# Patient Record
Sex: Female | Born: 1935 | Race: White | Hispanic: No | Smoking: Never smoker
Health system: Southern US, Community
[De-identification: ages and names within clinical notes are randomized; demographics above are authoritative.]

## PROBLEM LIST (undated history)

## (undated) DIAGNOSIS — G5603 Carpal tunnel syndrome, bilateral upper limbs: Secondary | ICD-10-CM

## (undated) DIAGNOSIS — H269 Unspecified cataract: Secondary | ICD-10-CM

## (undated) DIAGNOSIS — I471 Supraventricular tachycardia, unspecified: Secondary | ICD-10-CM

## (undated) DIAGNOSIS — K219 Gastro-esophageal reflux disease without esophagitis: Secondary | ICD-10-CM

## (undated) DIAGNOSIS — M1990 Unspecified osteoarthritis, unspecified site: Secondary | ICD-10-CM

## (undated) DIAGNOSIS — K209 Esophagitis, unspecified without bleeding: Secondary | ICD-10-CM

## (undated) DIAGNOSIS — Z8719 Personal history of other diseases of the digestive system: Secondary | ICD-10-CM

## (undated) DIAGNOSIS — M8580 Other specified disorders of bone density and structure, unspecified site: Secondary | ICD-10-CM

## (undated) DIAGNOSIS — B029 Zoster without complications: Secondary | ICD-10-CM

## (undated) DIAGNOSIS — D649 Anemia, unspecified: Secondary | ICD-10-CM

## (undated) DIAGNOSIS — C801 Malignant (primary) neoplasm, unspecified: Secondary | ICD-10-CM

## (undated) DIAGNOSIS — M419 Scoliosis, unspecified: Secondary | ICD-10-CM

## (undated) HISTORY — PX: BUNIONECTOMY: SHX129

## (undated) HISTORY — DX: Esophagitis, unspecified without bleeding: K20.90

## (undated) HISTORY — PX: COLONOSCOPY: SHX174

## (undated) HISTORY — DX: Other specified disorders of bone density and structure, unspecified site: M85.80

## (undated) HISTORY — DX: Scoliosis, unspecified: M41.9

## (undated) HISTORY — DX: Carpal tunnel syndrome, bilateral upper limbs: G56.03

## (undated) HISTORY — DX: Zoster without complications: B02.9

## (undated) HISTORY — DX: Unspecified cataract: H26.9

## (undated) HISTORY — DX: Gastro-esophageal reflux disease without esophagitis: K21.9

## (undated) HISTORY — PX: UPPER GASTROINTESTINAL ENDOSCOPY: SHX188

## (undated) HISTORY — PX: OTHER SURGICAL HISTORY: SHX169

## (undated) HISTORY — DX: Esophagitis, unspecified: K20.9

## (undated) HISTORY — DX: Unspecified osteoarthritis, unspecified site: M19.90

## (undated) HISTORY — PX: ABDOMINAL HYSTERECTOMY: SHX81

---

## 1959-05-03 HISTORY — PX: HERNIA REPAIR: SHX51

## 1998-10-20 ENCOUNTER — Other Ambulatory Visit: Admission: RE | Admit: 1998-10-20 | Discharge: 1998-10-20 | Payer: Self-pay | Admitting: Obstetrics and Gynecology

## 1999-11-09 ENCOUNTER — Other Ambulatory Visit: Admission: RE | Admit: 1999-11-09 | Discharge: 1999-11-09 | Payer: Self-pay | Admitting: Obstetrics and Gynecology

## 1999-11-10 ENCOUNTER — Other Ambulatory Visit: Admission: RE | Admit: 1999-11-10 | Discharge: 1999-11-10 | Payer: Self-pay | Admitting: Obstetrics and Gynecology

## 1999-11-10 ENCOUNTER — Encounter (INDEPENDENT_AMBULATORY_CARE_PROVIDER_SITE_OTHER): Payer: Self-pay

## 2000-01-13 ENCOUNTER — Other Ambulatory Visit: Admission: RE | Admit: 2000-01-13 | Discharge: 2000-01-13 | Payer: Self-pay | Admitting: Obstetrics and Gynecology

## 2000-01-13 ENCOUNTER — Encounter (INDEPENDENT_AMBULATORY_CARE_PROVIDER_SITE_OTHER): Payer: Self-pay | Admitting: Specialist

## 2000-03-03 ENCOUNTER — Other Ambulatory Visit: Admission: RE | Admit: 2000-03-03 | Discharge: 2000-03-03 | Payer: Self-pay | Admitting: Obstetrics and Gynecology

## 2000-03-03 ENCOUNTER — Encounter (INDEPENDENT_AMBULATORY_CARE_PROVIDER_SITE_OTHER): Payer: Self-pay | Admitting: Specialist

## 2000-03-17 ENCOUNTER — Inpatient Hospital Stay (HOSPITAL_COMMUNITY): Admission: RE | Admit: 2000-03-17 | Discharge: 2000-03-20 | Payer: Self-pay | Admitting: Obstetrics and Gynecology

## 2000-03-17 ENCOUNTER — Encounter (INDEPENDENT_AMBULATORY_CARE_PROVIDER_SITE_OTHER): Payer: Self-pay | Admitting: Specialist

## 2000-11-09 ENCOUNTER — Other Ambulatory Visit: Admission: RE | Admit: 2000-11-09 | Discharge: 2000-11-09 | Payer: Self-pay | Admitting: Obstetrics and Gynecology

## 2001-11-20 ENCOUNTER — Other Ambulatory Visit: Admission: RE | Admit: 2001-11-20 | Discharge: 2001-11-20 | Payer: Self-pay | Admitting: Obstetrics and Gynecology

## 2002-12-25 ENCOUNTER — Other Ambulatory Visit: Admission: RE | Admit: 2002-12-25 | Discharge: 2002-12-25 | Payer: Self-pay | Admitting: Obstetrics and Gynecology

## 2004-01-27 ENCOUNTER — Other Ambulatory Visit: Admission: RE | Admit: 2004-01-27 | Discharge: 2004-01-27 | Payer: Self-pay | Admitting: Obstetrics and Gynecology

## 2004-03-08 ENCOUNTER — Ambulatory Visit: Payer: Self-pay | Admitting: Internal Medicine

## 2004-07-06 ENCOUNTER — Ambulatory Visit: Payer: Self-pay | Admitting: Internal Medicine

## 2005-02-02 ENCOUNTER — Other Ambulatory Visit: Admission: RE | Admit: 2005-02-02 | Discharge: 2005-02-02 | Payer: Self-pay | Admitting: Obstetrics and Gynecology

## 2005-08-16 ENCOUNTER — Ambulatory Visit: Payer: Self-pay | Admitting: Internal Medicine

## 2006-02-21 ENCOUNTER — Other Ambulatory Visit: Admission: RE | Admit: 2006-02-21 | Discharge: 2006-02-21 | Payer: Self-pay | Admitting: Obstetrics and Gynecology

## 2006-03-02 ENCOUNTER — Ambulatory Visit: Payer: Self-pay | Admitting: Internal Medicine

## 2010-04-12 ENCOUNTER — Ambulatory Visit: Payer: Self-pay | Admitting: Internal Medicine

## 2010-05-13 ENCOUNTER — Ambulatory Visit: Admit: 2010-05-13 | Payer: Self-pay | Admitting: Internal Medicine

## 2010-05-13 ENCOUNTER — Ambulatory Visit (HOSPITAL_COMMUNITY)
Admission: RE | Admit: 2010-05-13 | Discharge: 2010-05-13 | Payer: Self-pay | Source: Home / Self Care | Attending: Internal Medicine | Admitting: Internal Medicine

## 2010-05-17 LAB — BASIC METABOLIC PANEL
BUN: 4 mg/dL — ABNORMAL LOW (ref 6–23)
CO2: 30 mEq/L (ref 19–32)
Calcium: 8.9 mg/dL (ref 8.4–10.5)
Chloride: 99 mEq/L (ref 96–112)
Creatinine, Ser: 0.69 mg/dL (ref 0.4–1.2)
GFR calc Af Amer: >60 mL/min (ref >60)
GFR calc non Af Amer: >60 mL/min (ref >60)
Glucose, Bld: 93 mg/dL (ref 70–99)
Potassium: 4 mEq/L (ref 3.5–5.1)
Sodium: 140 mEq/L (ref 135–145)

## 2010-05-17 LAB — FECAL LACTOFERRIN, QUANT: Fecal Lactoferrin: POSITIVE

## 2010-05-17 LAB — CBC
HCT: 38.5 % (ref 36.0–46.0)
Hemoglobin: 12.8 g/dL (ref 12.0–15.0)
MCH: 28.8 pg (ref 26.0–34.0)
MCHC: 33.2 g/dL (ref 30.0–36.0)
MCV: 86.7 fL (ref 78.0–100.0)
Platelets: 280 10*3/uL (ref 150–400)
RBC: 4.44 MIL/uL (ref 3.87–5.11)
RDW: 13.6 % (ref 11.5–15.5)
WBC: 7 10*3/uL (ref 4.0–10.5)

## 2010-05-17 LAB — STOOL CULTURE

## 2010-05-17 LAB — OVA AND PARASITE EXAMINATION: Ova and parasites: NONE SEEN

## 2010-05-17 LAB — CLOSTRIDIUM DIFFICILE BY PCR: Toxigenic C. Difficile by PCR: NEGATIVE

## 2010-05-31 NOTE — Op Note (Signed)
[name], Soto               ACCOUNT NO.:  [id number]  MEDICAL RECORD NO.:  [id number]          PATIENT TYPE:  AMB  LOCATION:  DAY                           FACILITY:  [facility]  PHYSICIAN:  Hildred Laser, M.D.    DATE OF BIRTH:  [birth year]  DATE OF PROCEDURE:  05/13/2010 DATE OF DISCHARGE:  05/13/2010                              OPERATIVE REPORT   PROCEDURE:  Colonoscopy which was incomplete to hepatic flexure.  Lori Soto is a 75 year old Caucasian female with history of ulcerative colitis which was diagnosed in 2000, whose last colonoscopy was done over 5 years ago who presents to the office about a month ago with bloody diarrhea.  She has been under a lot of stress.  She has been on Asacol for years and has done well.  It was felt that she was having a relapse.  She was treated with hydrocortisone enemas, but without any improvement in her symptoms.  She is undergoing colonoscopy both for diagnostic and surveillance purposes.  Procedure risks were reviewed with the patient and informed consent was obtained.  MEDICATIONS FOR CONSCIOUS SEDATION: 1. Demerol 50 mg IV. 2. Versed 6 mg IV.  FINDINGS:  Procedure performed in endoscopy suite.  The patient's vital signs and O2 sats were monitored during the procedure and remained stable.  The patient was placed in left lateral recumbent position and rectal examination performed.  No abnormality noted on external or digital exam.  Pentax videoscope was placed through rectum, where there was extensive ulceration to the mucosa and mucosa that was not ulcerated was erythematous, edematous and friable with loss of vascularity.  Scope was passed into the sigmoid colon with similar changes.  There was about 10-12 mm polyp with ulceration at the sigmoid colon.  Scope was advanced into proximal sigmoid colon.  The segment was very tortuous and noncompliant.  With some difficulty, scope was passed further. Transition zone was at 35 cm from the  anal margin.  There was another large patch of erythematous mucosa with erosions at descending colon. Rest of the mucosa was normal to hepatic flexure.  Scope was passed into hepatic flexure.  I could see a short segment of ascending colon, but never could advance the scope further as she suspected to have a short loop which could not be palpated.  Changing the patient's position and withdrawing the scope back did not make any difference.  Therefore, examination without complete.  There were few scattered diverticula throughout the colon.  As the scope was withdrawn, colonic mucosa was examined for the second time.  No other abnormalities noted.  Multiple biopsies were taken from mucosa of the sigmoid colon and this polyp was also biopsied for histology.  This polyp was not snared because of changes of extensive colitis and presuming this is an adenoma this polyp would be removed later date.  Please note, stool sample was also obtained and sent to the lab for cultures by PCR.  Endoscope was withdrawn.  I did not attempt to retroflex the scope in the rectum.  The patient tolerated the procedure well.  FINAL DIAGNOSES: 1. Incomplete exam limited to hepatic  flexure.  Few small diverticula     scattered at transverse, descending and sigmoid colon. 2. Extensive changes of active colitis involving the sigmoid colon and     rectum with a patch of focal colitis at descending colon.  Multiple     biopsies taken. 3. A 10-12 mm polyp at the sigmoid colon which was biopsied for     histology and not removed because of underlying active colitis.  RECOMMENDATIONS: 1. She will continue Asacol at 1.6 g p.o. b.i.d. 2. Rowasa enemas 4 g one p.o. nightly for 4 weeks.  I will be contacting the patient with results of biopsy and stool studies and further recommendations.     Hildred Laser, M.D.     NR/MEDQ  D:  05/13/2010  T:  05/14/2010  Job:  [id number]  cc:   Chipper Herb, M.D. Fax:  [phone]  Electronically Signed by Hildred Laser M.D. on 05/31/2010 07:18:10 AM

## 2010-07-12 ENCOUNTER — Ambulatory Visit (INDEPENDENT_AMBULATORY_CARE_PROVIDER_SITE_OTHER): Payer: Self-pay | Admitting: Internal Medicine

## 2010-07-26 ENCOUNTER — Ambulatory Visit (INDEPENDENT_AMBULATORY_CARE_PROVIDER_SITE_OTHER): Payer: Medicare Other | Admitting: Internal Medicine

## 2010-07-26 DIAGNOSIS — K5190 Ulcerative colitis, unspecified, without complications: Secondary | ICD-10-CM

## 2010-08-08 NOTE — Consult Note (Signed)
NAMEOCTA, UPLINGER               ACCOUNT NO.:  [id number]  MEDICAL RECORD NO.:  [id number]           PATIENT TYPE:AMB  LOCATION: [place]                    FACILITY:  PHYSICIAN:  Hildred Laser, M.D.    DATE OF BIRTH:  Jan 24, 1936   DATE : 07/27/2010.                                OFFICE VISIT.   PRESENTING COMPLAINT:  Followup for ulcerative colitis.  SUBJECTIVE:  Lori Soto is a 75 year old Caucasian female who underwent colonoscopy on May 13, 2010, because of consistent bloody diarrhea. It was incomplete exam to hepatic flexure.  She had few diverticula at sigmoid descending and transverse colon.  She had extensive changes of colitis, involving rectum, sigmoid colon, and focal colitis at the descending colon.  She also had polyp at sigmoid colon which was biopsied and was inflammatory or pseudopolyp.  Biopsy from other areas showed active disease.  She was begun on prednisone on May 27, 2010, given a tapering schedule.  Her stool study had been negative.  She states she is feeling much better.  She has come off her prednisone, last dose 4 days ago.  She is not taking hyoscyamine.  She is taking her Asacol as prescribed.  She is having 2-3 bowel movements per day.  Her stools are soft.  She has not seen any blood for the last 1 month.  She remains under lot of stress because of her husband illness.  He has history of lung carcinoma and lately has been having chest and right shoulder pain.  He has an appointment to be seen later this week by Dr. Laurance Flatten.  Her weight is down by 4 pounds since the last visit about 10 weeks ago.  Overall, she feels much better.  She uses OTC Motrin occasionally.  CURRENT MEDICATIONS: 1. Estradiol 1 mg p.o. every other day. 2. Zinc 50 mg p.o. daily. 3. Vitamin B6 100 mg daily. [place] 1 daily. 5. Magnesium 250 mg p.o. daily. 6. KCl 99 mg p.o. daily. 7. Asacol 1.6 g b.i.d. 8. Glucosamine sulfate with MSM 1 daily. 9. Calcium 500  mg. 10.Vitamin D daily. 11.Vitamin D 3000 units daily. 12.Cranberry fruits 4.2 g p.o. daily. 13.Vitamin B12 500 mg daily. 14.Drink of [place] 120 mg daily. 15.MVI daily. 16.ASA 81 mg daily. 17.Pentasa, Rowasa enemas every other day.  OBJECTIVE:  VITAL SIGNS:  148 pounds.  She is 63 and 1/2 inches tall, pulse 76 per minute, blood pressure 128/94, temperature is 97.1. HEENT:  Conjunctivae pink.  Sclerae nonicteric.  Oropharyngeal mucosa is normal. NECK:  No neck masses or thyromegaly noted. ABDOMEN:  Symmetrical.  Bowel sounds are normal on palpation.  Soft abdomen with very mild tenderness at LLQ.  No organomegaly or masses. No peripheral edema or clubbing.  ASSESSMENT:  Chronic ulcerative colitis with a recent flare up, probably stress induced.  She is doing better with therapy.  Not mentioned above, she has experienced intermittent left lower quadrant abdominal pain which appears to be posterior and probably referred from her back.  PLAN:  She will continue Asacol at 1.6 g p.o. daily.  RECOMMENDATIONS:  She will finish up on her Rowasa enemas, she will use 1 every third  day.  Unless she has problem, she will return for OV in 6 months.     Hildred Laser, M.D.     NR/MEDQ  D:  07/27/2010  T:  07/27/2010  Job:  [id number]  cc:   Chipper Herb, M.D. Fax: [phone]  Electronically Signed by Hildred Laser M.D. on 08/08/2010 01:02:51 PM

## 2010-09-17 NOTE — H&P (Signed)
[facility]  Patient:    Lori Soto, Lori Soto                        MRN: [id number] Adm. Date:  03/17/00 Attending:  Jeneen Rinks A. Zigmund Daniel, M.D.                         History and Physical  HISTORY OF PRESENT ILLNESS:  This is a 75 year old gravida 3, para 3 who presented January 12, 2000 complaining of intermittent left lower quadrant discomfort of four months duration.  She has a known fibroid uterus.  She also has a history of focal simple hyperplasia as of a November 09, 1999 biopsy. She has been maintained on Megace 40 mg daily since then.  Repeat endometrial biopsy was performed on or about January 12, 2000.  It revealed residual simple hyperplasia without atypia; this was despite six weeks of Megace therapy.  Alternatives were discussed and the patient elected to proceed with hysterectomy and bilateral salpingo-oophorectomy.  Due to her history of pelvic discomfort and the fibroid uterus, it was felt best to proceed with laparoscopically assisted vaginal hysterectomy.  MEDICATIONS 1. Premarin 0.625 mg daily. 2. Provera 5 mg, days 1 through 12 each month, prior to Megace, which has been    40 mg daily.  CHRONIC MEDICAL ILLNESS:  Scoliosis.  SURGERY:  Herniorrhaphy in 1961, removal of breast lump in 1984, foot surgery in 1992.  ALLERGIES:  PENICILLIN -- rash.  FAMILY HISTORY:  Noncontributory.  SOCIAL HISTORY:  Patient denies the use of tobacco or significant alcohol.  REVIEW OF SYSTEMS:  Noncontributory.  PHYSICAL EXAMINATION  GENERAL:  Well-developed, well-nourished, pleasant white female in no acute distress.  Afebrile.  VITAL SIGNS:  Stable.  SKIN:  Warm and dry without lesions.  LYMPH:  There is no supraclavicular, cervical or inguinal adenopathy.  HEENT:  Normocephalic.  NECK:  Supple without thyromegaly.  CHEST:  Lungs clear.  CARDIAC:  Regular rate and rhythm without murmurs, gallops or rubs.  BREASTS:  Deferred.  ABDOMEN:   Soft and nontender, without masses or organomegaly.  Bowel sounds are active.  MUSCULOSKELETAL:  Full range of motion, without edema, cyanosis or CVA tenderness.  PELVIC:  Deferred until examination under anesthesia.  IMPRESSION 1. Fibroid uterus. 2. Endometrial hyperplasia without atypia -- refractory to Megace therapy. 3. Left lower quadrant discomfort -- etiology uncertain.  PLAN:  Laparoscopically assisted vaginal hysterectomy and bilateral salpingo-oophorectomy.  Risks, benefits, complications and alternatives were fully discussed with the patient.  She states she understands and accepts. Questions invited and answered. DD:  03/17/00 TD:  03/17/00 Job: [id number]

## 2010-09-17 NOTE — Discharge Summary (Signed)
[facility]  Patient:    Lori Soto, Lori Soto                     MRN: [id number] Adm. Date:  [id number] Disc. Date: [id number] Attending:  Kendal Hymen                           Discharge Summary  DISCHARGE DIAGNOSES: 1. Endometrial hyperplasia - refractory to medical management. 2. Pelvic pain. 3. Adenomyosis. 4. Multiple leiomyomata. 5. Endometriosis.  OPERATIONS AND SPECIAL PROCEDURES: 1. Diagnostic laparoscopy. 2. Total abdominal hysterectomy/bilateral salpingo-oophorectomy.  CONSULTATIONS:  None.  DISCHARGE MEDICATIONS: 1. Estrace 1 mg daily. 2. Darvocet-N 100 one p.o. q.3-4h. p.r.n. pain.  HISTORY OF PRESENT ILLNESS:  This is a 75 year old gravida 3, para 2, AB 1, followed since September 1973 with left lower quadrant discomfort, known fibroid uterus, and also endometrial hyperplasia.  She is admitted for laparoscopically assisted vaginal hysterectomy and bilateral salpingo-oophorectomy for the above diagnoses including hyperplasia refractory to medical management.  HOSPITAL COURSE:  Patient was admitted to [facility].  Admission laboratory studies were drawn.  On March 17, 2000, she was taken to the operating room and underwent diagnostic laparoscopy.  In the context of this procedure, it was apparent that patient could not undergo laparoscopically assisted vaginal hysterectomy.  Hence, the decision was made to proceed with total abdominal hysterectomy and bilateral salpingo-oophorectomy.  Please see operative report for details.  There were no intraoperative complications. Postoperative course was benign.  Patient was discharged on March 19, 2000, afebrile and in satisfactory condition.  ACCESSORY CLINICAL FINDINGS:  Hemoglobin and hematocrit on admission was 11.9 and 34.5 respectively.  Repeat labs obtained on March 20, 2000, were 9.5 and 27.6 respectively.  Type and Rh revealed AB negative blood.   Antibody screen was positive for ______  DISPOSITION:  Patient is to return to [facility] in approximately four weeks for postoperative evaluation. DD:  05/25/00 TD:  05/25/00 Job: [id number]

## 2010-09-17 NOTE — Op Note (Signed)
[place]  Patient:    ARTA, STUMP                     MRN: [id number] Proc. Date: 03/17/00 Adm. Date:  [id number] Attending:  Kendal Hymen                           Operative Report  PREOPERATIVE DIAGNOSES: 1. Endometrial hyperplasia-refractory to medical management. 2. Pelvic pain.  POSTOPERATIVE DIAGNOSES: 1. Endometrial hyperplasia-refractory to medical management. 2. Pelvic pain. 3. Pathology pending. 4. Adhesions from the rectum to the posterior aspect of the uterus and left    ovary to the broad ligament.  PROCEDURE: 1. Diagnostic laparoscopy. 2. Total abdominal hysterectomy/bilateral salpingo-oophorectomy.  SURGEON:  Gerda Diss. Zigmund Daniel, M.D.  ASSISTANT:  Dallie Dad, M.D.  ANESTHESIA:  General.  ESTIMATED BLOOD LOSS:  500 cc.  COMPLICATIONS:  None.  PACKS AND DRAINS:  Foley.  Sponge, needle, and instrument count reported as correct x 2.  PROCEDURE:  Patient was taken to the operating room and placed in the dorsal supine position.  After adequate general anesthesia was administered she was placed in the lithotomy position and prepped and draped in the usual manner for abdominal and vaginal surgery.  Posterior weighted retractor was placed per vagina.  Hulka tenaculum was placed into the cervix for uterine manipulation.  Foley catheter was placed.  Next, a 1.5 cm infraumbilical incision was made in the longitudinal plane.  The size 10/11 disposable laparoscopic Trocar was placed into the abdominal cavity.  The laparoscope was placed.  There was no evidence of any trauma.  Pneumoperitoneum was created and maintained throughout with CO2.  The pelvis was thoroughly inspected.  The uterus was normal size, shape, and contour without any evidence of endometriosis or fibroids.  However, the colon/rectum was adherent to the posterior aspect of the cervix and took up the cul-de-sac making vaginal approach to  hysterectomy seemingly quite treacherous without significant laparoscopic lysis of adhesions prior to proceeding with that approach.  In addition, the left ovary was not easily visualized as it seemed to be adherent to the posterior aspect of the broad ligament and thus could not be satisfactorily mobilized.  The right ovary and fallopian tube were seen and appeared normal.  Remainder of the peritoneal surfaces were smooth and glistening.  At this point the decision was made to abandon the laparoscopic approach and the laparoscopic assisted vaginal hysterectomy for the afore mentioned reasons.  The laparoscopic assessment was aided with placement of two suprapubic Trocars in the left and right lower quadrants respectively. The abdominal instruments were removed, the pneumoperitoneum evacuated.  The fascial defect in the umbilicus was closed with 0 Vicryl.  The skin was closed with 3-0 Chromic in a subcuticular fashion.                                Attention was then turned to the laparotomy. Approximately three to four fingerbreadths above the symphysis the incision was made in a Pfannenstiel fashion including the skin incisions associated with the inferior Trocar ports from the laparoscopy.  The subcutaneous tissues were sharply dissected down to the fascia which was nicked with a knife and incised transversely with Mayo scissors.  The underlying rectus muscles were separated from the overlying fascia using sharp and blunt dissection.  The rectus muscles were separated in the midline  exposing the peritoneum which was elevated with Hemostats and entered atraumatically with Metzenbaum scissors. The incision was extended longitudinally.  Several adhesions were lysed of the colon to the left aspect of the pelvis and left adnexal area.  Balfour retractor was successfully placed.  The upper abdomen was packed off so that access to the pelvis could be allowed.  The rectum was sharply and  bluntly dissected away from the inferior aspect of the cervix thus that the hysterectomy could be successfully accomplished in this manner.  In addition, the adhesions of the left ovary to the left broad ligament were sharply and bluntly lysed in order to mobilize the left ovary.  The round ligaments were then bilaterally clamped and cut and secured with #1 Chromic catgut.  A bladder flap was developed by incising the anterior aspect of the broad ligament, sharply and bluntly dissecting the bladder inferiorly.  The visceral peritoneum was incised in order to isolate the infundibulopelvic ligaments. The ureters were bilaterally noted to be well below the plane of dissection. The infundibulopelvic ligaments were bilaterally clamped, cut, and doubly secured with #1 Chromic catgut.  Next, the uterine vessels were skeletonized bilaterally.  They were bilaterally clamped, cut, and doubly secured with #1 Chromic catgut.  Next, the bladder was continually advanced inferiorly using sharp and blunt dissection.  Straight Heaney clamps were used to come down on either side of the cervix hugging the cervix and paying careful attention to avoid injury to the urinary tract.  Each pedicle was clamped, cut, and secured with #1 Chromic catgut.  The uterosacral ligaments were isolated bilaterally and clamped, cut, and secured with #1 Chromic catgut.  The pedicles were held. The vagina was entered with the step and the cervix was excised with Satinsky scissors.  It should be mentioned that prior to excision of the cervix the corpus was removed in order to gain better visualization of the cervix. Entire specimen was submitted to pathology for histologic studies.  Next, the vagina was secured hemostatically with a circumferential running suture of 3-0 Vicryl.  The vaginal angles were closed with 0 Vicryl using Richardson angle sutures.  Hemostasis was noted.  A single suture was placed in the midline of the  vagina in order to avoid any possible herniation.  The uterosacral ligament pedicles were tied together in the midline to minimize the chance of subsequent enterocele.  At this portion of the procedure the urine was clear  and copious.  There was no evidence of any violation of the GI tract whatsoever.  Copious irrigation was accomplished.  Hemostasis was noted. At this point the packs were removed.  Retractors were removed.  The fascia was closed with 0 Vicryl in a running fashion.  Hemostasis was noted.  The skin was closed with staples.  Patient tolerated the procedure extremely well and was returned to the recovery room in good condition.                                At the conclusion of the procedure the urine was clear and copious. DD:  03/19/00 TD:  03/19/00 Job: [id number]

## 2011-02-09 ENCOUNTER — Encounter (INDEPENDENT_AMBULATORY_CARE_PROVIDER_SITE_OTHER): Payer: Self-pay | Admitting: *Deleted

## 2011-03-11 ENCOUNTER — Telehealth (INDEPENDENT_AMBULATORY_CARE_PROVIDER_SITE_OTHER): Payer: Self-pay | Admitting: *Deleted

## 2011-03-11 NOTE — Telephone Encounter (Signed)
Per Dr. Laural Golden, we will wait till office visit and decide labs at that time if needed. Patient was called and made aware.

## 2011-03-11 NOTE — Telephone Encounter (Signed)
Patient has appt w/ NUR 11/20 wants to know if she needs blood work prior to [facility]

## 2011-03-22 ENCOUNTER — Ambulatory Visit (INDEPENDENT_AMBULATORY_CARE_PROVIDER_SITE_OTHER): Payer: Medicare Other | Admitting: Internal Medicine

## 2011-03-22 ENCOUNTER — Encounter (INDEPENDENT_AMBULATORY_CARE_PROVIDER_SITE_OTHER): Payer: Self-pay | Admitting: Internal Medicine

## 2011-03-22 VITALS — BP 118/82 | HR 72 | Temp 99.0°F | Resp 14 | Ht 63.0 in | Wt 147.0 lb

## 2011-03-22 DIAGNOSIS — K5190 Ulcerative colitis, unspecified, without complications: Secondary | ICD-10-CM

## 2011-03-22 MED ORDER — MESALAMINE 400 MG PO TBEC: 1600.0000 mg | DELAYED_RELEASE_TABLET | Freq: Two times a day (BID) | ORAL | Status: DC

## 2011-03-22 MED ORDER — MESALAMINE 400 MG PO TBEC: 400.0000 mg | DELAYED_RELEASE_TABLET | Freq: Two times a day (BID) | ORAL | Status: DC

## 2011-03-22 NOTE — Patient Instructions (Addendum)
Continue Asacol at current dose which is 4 tablets(1.6 g) twice daily

## 2011-03-23 ENCOUNTER — Encounter (INDEPENDENT_AMBULATORY_CARE_PROVIDER_SITE_OTHER): Payer: Self-pay | Admitting: Internal Medicine

## 2011-03-23 DIAGNOSIS — K5190 Ulcerative colitis, unspecified, without complications: Secondary | ICD-10-CM | POA: Insufficient documentation

## 2011-03-23 NOTE — Progress Notes (Signed)
Presenting complaint; Followup for ulcerative colitis. Subjective: Patient is 75 year old Caucasian Caucasian female who was here for scheduled visit. She has 12 year history of ulcerative colitis. Her last relapse was in December 2011. She had colonoscopy in January this year to hepatic flexure. She had few diverticula extensive changes of active colitis involving sigmoid colon and rectum. She had one polyp snared which was inflammatory. She's been off prednisone for over 9 months. She is having 1 formed stool daily and occasionally 2. She has not passed any blood with her bowel movements for over 10 months. She is still having some pain in left lower quadrant of her abdomen when she is constipated or stressed out. He she's been under a lot of stress. Her husband is sick and her brother age 28 died 2 months ago of lung carcinoma. Current Medications: Current Outpatient Prescriptions  Medication Sig Dispense Refill  . aspirin 81 MG tablet Take 81 mg by mouth daily.        [name], Vaccinium myrtillus, (BILBERRY PO) Take 375 mg by mouth daily.        . Calcium Carbonate-Vit D-Min (CALCIUM 1200 PO) Take by mouth 2 (two) times daily.        . Cholecalciferol (VITAMIN D3) 1000 UNITS CAPS Take by mouth daily.        . Cyanocobalamin (VITAMIN B 12 PO) Take by mouth daily.        [name] estradiol (ESTRACE) 1 MG tablet Take 1 mg by mouth daily.        . Ginkgo Biloba 40 MG TABS Take 120 mg by mouth daily.        [name] glucosamine-chondroitin 500-400 MG tablet Take 1 tablet by mouth 2 (two) times daily.        . Iron-Folic Acid-Vit E08 (IRON FORMULA PO) Take by mouth. Patient states that she takes this 3 times per week.       . mesalamine (ASACOL) 400 MG EC tablet Take 4 tablets (1,600 mg total) by mouth 2 (two) times daily.  720 tablet  3  . Misc Natural Products (BUTCHERS BROOM PO) Take by mouth daily.        . MULTIPLE VITAMINS PO Take by mouth daily.        . Potassium Gluconate 550 MG TABS Take by mouth  daily.        [name] pyridOXINE (VITAMIN B-6) 100 MG tablet Take 100 mg by mouth daily.        . Vitamins C E (CRANBERRY CONCENTRATE PO) Take 4,200 mg by mouth daily.        . Zinc 50 MG CAPS Take by mouth daily.          Objective: BP 118/82  Pulse 72  Temp(Src) 99 F (37.2 C) (Oral)  Resp 14  Ht 5' 3"  (1.6 m)  Wt 147 lb (66.679 kg)  BMI 26.04 kg/m2 Conjunctiva is pink. Sclerae nonicteric. Oropharyngeal mucosa is normal. No neck masses or thyromegaly noted. Abdomen is symmetrical. It is soft with mild tenderness at LLQ on deep palpation. No organomegaly or masses. No LE edema or clubbing noted.     Assessment: Ulcerative colitis appears to be intermission. Disease duration 12 year. She appears to be in remission on current dose of Asacol. Stress and her requirement certainly couldn't push her into relapse; hopefully this would not materialize.   Plan: Continue Asacol at 1.6 g twice daily. New prescription given for 3 months with 3 refills. Office visit in 65  months.

## 2011-06-30 DIAGNOSIS — Z1231 Encounter for screening mammogram for malignant neoplasm of breast: Secondary | ICD-10-CM | POA: Diagnosis not present

## 2011-07-04 DIAGNOSIS — Z01419 Encounter for gynecological examination (general) (routine) without abnormal findings: Secondary | ICD-10-CM | POA: Diagnosis not present

## 2011-07-04 DIAGNOSIS — Z90710 Acquired absence of both cervix and uterus: Secondary | ICD-10-CM | POA: Diagnosis not present

## 2011-07-04 DIAGNOSIS — Z1272 Encounter for screening for malignant neoplasm of vagina: Secondary | ICD-10-CM | POA: Diagnosis not present

## 2011-07-27 DIAGNOSIS — Z0000 Encounter for general adult medical examination without abnormal findings: Secondary | ICD-10-CM | POA: Diagnosis not present

## 2011-07-27 DIAGNOSIS — N390 Urinary tract infection, site not specified: Secondary | ICD-10-CM | POA: Diagnosis not present

## 2011-07-27 DIAGNOSIS — E785 Hyperlipidemia, unspecified: Secondary | ICD-10-CM | POA: Diagnosis not present

## 2011-08-29 ENCOUNTER — Telehealth (INDEPENDENT_AMBULATORY_CARE_PROVIDER_SITE_OTHER): Payer: Self-pay | Admitting: *Deleted

## 2011-08-29 NOTE — Telephone Encounter (Signed)
Patient was called and made aware that a written prescription was ready for her to pick up or if she will provide the correct number for me to fax it into , I would.

## 2011-09-05 ENCOUNTER — Telehealth (INDEPENDENT_AMBULATORY_CARE_PROVIDER_SITE_OTHER): Payer: Self-pay | Admitting: *Deleted

## 2011-09-05 ENCOUNTER — Encounter (INDEPENDENT_AMBULATORY_CARE_PROVIDER_SITE_OTHER): Payer: Self-pay | Admitting: *Deleted

## 2011-09-05 NOTE — Telephone Encounter (Signed)
The following prescription was called to the patient's mail order pharmacy :Asacol HD 800 mg   Take 2 by mouth twice a day - 3 month supply - 3 refills

## 2011-09-06 DIAGNOSIS — E785 Hyperlipidemia, unspecified: Secondary | ICD-10-CM | POA: Diagnosis not present

## 2011-10-05 ENCOUNTER — Ambulatory Visit: Payer: Medicare Other | Admitting: Cardiology

## 2011-10-10 ENCOUNTER — Ambulatory Visit (INDEPENDENT_AMBULATORY_CARE_PROVIDER_SITE_OTHER): Payer: Medicare Other | Admitting: Internal Medicine

## 2011-10-10 ENCOUNTER — Encounter (INDEPENDENT_AMBULATORY_CARE_PROVIDER_SITE_OTHER): Payer: Self-pay | Admitting: Internal Medicine

## 2011-10-10 VITALS — BP 140/78 | HR 76 | Temp 97.8°F | Resp 18 | Ht 62.0 in | Wt 138.4 lb

## 2011-10-10 DIAGNOSIS — K5190 Ulcerative colitis, unspecified, without complications: Secondary | ICD-10-CM | POA: Diagnosis not present

## 2011-10-10 NOTE — Patient Instructions (Signed)
No changes made in medications. To start Asacol HD when current prescription of Asacol runs out.

## 2011-10-10 NOTE — Progress Notes (Signed)
Presenting complaint;  Followup for ulcerative colitis.  Subjective:  Lori Soto is a 76 year old Caucasian female who is in for scheduled visit. She was last seen in November 2012. She was treated for relapse in January 2012 and she had incomplete colonoscopy to hepatic flexure. She feels she is doing quite well even though she is thin and a lot of stress. She lost her husband because of lung carcinoma 5 months ago. She usually has one to 2 bowel movements per day. Stools are formed. Occasionally she may feel constipated. She denies melena or rectal bleeding. She recalls she had complete blood count and April this year was normal. When she finishes the prescription for Asacol she will begin Asacol HD.  Current Medications: Current Outpatient Prescriptions  Medication Sig Dispense Refill  . aspirin 81 MG tablet Take 81 mg by mouth daily.        [name], Vaccinium myrtillus, (BILBERRY PO) Take 375 mg by mouth daily.        . Calcium Carbonate-Vit D-Min (CALCIUM 1200 PO) Take by mouth 2 (two) times daily.        . Cholecalciferol (VITAMIN D3) 1000 UNITS CAPS Take 5,000 Int'l Units by mouth 2 (two) times daily.       . Cyanocobalamin (VITAMIN B 12 PO) Take 1,000 mcg by mouth daily.       [name] estradiol (ESTRACE) 1 MG tablet Take 1 mg by mouth every other day.       . Ginkgo Biloba 40 MG TABS Take 120 mg by mouth daily.        [name] glucosamine-chondroitin 500-400 MG tablet Take 1 tablet by mouth 2 (two) times daily.        . Iron-Folic Acid-Vit E17 (IRON FORMULA PO) Take by mouth. Patient states that she takes this 3 times per week.       . mesalamine (ASACOL) 400 MG EC tablet Take 4 tablets (1,600 mg total) by mouth 2 (two) times daily.  720 tablet  3  . Misc Natural Products (BUTCHERS BROOM PO) Take by mouth daily.        . MULTIPLE VITAMINS PO Take by mouth daily.        . Potassium Gluconate 550 MG TABS Take by mouth daily.        [name] pyridOXINE (VITAMIN B-6) 100 MG tablet Take 100 mg by mouth daily.         . Vitamins C E (CRANBERRY CONCENTRATE PO) Take 4,200 mg by mouth daily.        . Zinc 50 MG CAPS Take by mouth daily.           Objective: Blood pressure 140/90, pulse 76, temperature 97.8 F (36.6 C), temperature source Oral, resp. rate 18, height 5' 2"  (1.575 m), weight 138 lb 6.4 oz (62.778 kg). Conjunctiva is pink. Sclera is nonicteric Oropharyngeal mucosa is normal. No neck masses or thyromegaly noted. Cardiac exam with regular rhythm normal S1 and S2. No murmur or gallop noted. Lungs are clear to auscultation. Abdomen is soft and nontender without organomegaly or masses.  No LE edema or clubbing noted.   Assessment:  Chronic ulcerative colitis. It appears she is in remission despite great deal of stress due to her husband's illness and demise. She is being switched from Asacol to Asacol HD as Asacol is not available anymore.   Plan:  Continue mesalamine at current dose. We'll request copy of blood work from Dr. Tawanna Sat office for review. Office visit in 6 months.  Next colonoscopy in January 2017.

## 2011-10-18 ENCOUNTER — Encounter (INDEPENDENT_AMBULATORY_CARE_PROVIDER_SITE_OTHER): Payer: Self-pay

## 2011-10-26 ENCOUNTER — Encounter: Payer: Self-pay | Admitting: Cardiology

## 2011-10-26 ENCOUNTER — Ambulatory Visit (INDEPENDENT_AMBULATORY_CARE_PROVIDER_SITE_OTHER): Payer: Medicare Other | Admitting: Cardiology

## 2011-10-26 VITALS — BP 150/100 | HR 68 | Ht 63.0 in | Wt 137.0 lb

## 2011-10-26 DIAGNOSIS — I10 Essential (primary) hypertension: Secondary | ICD-10-CM

## 2011-10-26 DIAGNOSIS — R002 Palpitations: Secondary | ICD-10-CM | POA: Insufficient documentation

## 2011-10-26 NOTE — Progress Notes (Signed)
HPI The patient presents for evaluation of some palpitations. She's had no prior cardiac history other than a workup in [place] a couple of years ago. She would say they're part of the year with her husband who has since died. She had an echocardiogram and I reviewed this. It was normal except for some mild aortic valve sclerosis. She doesn't recall why she had this. She does not get any chest pressure, neck or arm discomfort. She doesn't report any shortness of breath, PND or orthopnea. However, she does occasionally get some strong or irregular heartbeats only at night when she's lying in bed. However, she reports this is probably related to the stress of losing her husband earlier this year. He is quite tearful recounting this. She does do some activities without bringing on any cardiovascular symptoms.  She's never had a heart catheterization or stress test.  Allergies  Allergen Reactions  . Influenza Virus Vacc Split Pf Rash    Per Patient she was told by her PCP not to ever take this injection again  . Penicillins Itching    Patient states that she had a rash also    Current Outpatient Prescriptions  Medication Sig Dispense Refill  . aspirin 81 MG tablet Take 81 mg by mouth daily.        [name], Vaccinium myrtillus, (BILBERRY PO) Take 375 mg by mouth daily.        . Calcium Carbonate-Vit D-Min (CALCIUM 1200 PO) Take by mouth 2 (two) times daily.        . Cholecalciferol (VITAMIN D3) 1000 UNITS CAPS Take 5,000 Int'l Units by mouth daily.       . Cyanocobalamin (VITAMIN B 12 PO) Take 1,000 mcg by mouth daily.       [name] estradiol (ESTRACE) 1 MG tablet Take 1 mg by mouth every other day.       . Ginkgo Biloba 40 MG TABS Take 120 mg by mouth daily.        [name] glucosamine-chondroitin 500-400 MG tablet Take 1 tablet by mouth 2 (two) times daily.        . Iron-Folic Acid-Vit Q94 (IRON FORMULA PO) Take by mouth. Patient states that she takes this 3 times per week.       . mesalamine (ASACOL)  400 MG EC tablet Take 4 tablets (1,600 mg total) by mouth 2 (two) times daily.  720 tablet  3  . Misc Natural Products (BUTCHERS BROOM PO) Take by mouth daily.        . MULTIPLE VITAMINS PO Take by mouth daily.        . Potassium Gluconate 550 MG TABS Take by mouth daily.        [name] pyridOXINE (VITAMIN B-6) 100 MG tablet Take 100 mg by mouth daily.        . Zinc 50 MG CAPS Take by mouth daily.        . Vitamins C E (CRANBERRY CONCENTRATE PO) Take 4,200 mg by mouth daily.          Past Medical History  Diagnosis Date  . Ulcerative colitis   . Shingles   . Cataract     Past Surgical History  Procedure Date  . Colonoscopy   . Upper gastrointestinal endoscopy   . Bunionectomy     Bilateral  . Hernia     Right inguinal  . Abdominal hysterectomy     Family History  Problem Relation Age of Onset  . Arthritis Mother   .  Prostate cancer Father   . Liver cancer Brother   . Lung cancer Brother   . Heart disease Sister     Rheumatic Fever  . Colon cancer Sister   . Diabetes Sister   . GI problems Sister     History   Social History  . Marital Status: Married    Spouse Name: N/A    Number of Children: 3  . Years of Education: N/A   Occupational History  .     Social History Main Topics  . Smoking status: Never Smoker   . Smokeless tobacco: Never Used  . Alcohol Use: Yes     Very Rarely  . Drug Use: No  . Sexually Active: Not on file   Other Topics Concern  . Not on file   Social History Narrative   Lives alone.    ROS:  Positive for diarrhea and constipation, joint pains, varicose veins. Otherwise as stated in the history of present illness and negative for all other systems.  PHYSICAL EXAM BP 150/100  Pulse 68  Ht 5' 3"  (1.6 m)  Wt 137 lb (62.143 kg)  BMI 24.27 kg/m2 GENERAL:  Well appearing HEENT:  Pupils equal round and reactive, fundi not visualized, oral mucosa unremarkable NECK:  No jugular venous distention, waveform within normal limits, carotid  upstroke brisk and symmetric, no bruits, no thyromegaly LYMPHATICS:  No cervical, inguinal adenopathy LUNGS:  Clear to auscultation bilaterally BACK:  No CVA tenderness CHEST:  Unremarkable HEART:  PMI not displaced or sustained,S1 and S2 within normal limits, no S3, no S4, no clicks, no rubs, no murmurs ABD:  Flat, positive bowel sounds normal in frequency in pitch, no bruits, no rebound, no guarding, no midline pulsatile mass, no hepatomegaly, no splenomegaly EXT:  2 plus pulses throughout, no edema, no cyanosis no clubbing SKIN:  No rashes no nodules NEURO:  Cranial nerves II through XII grossly intact, motor grossly intact throughout [facility]:  Cognitively intact, oriented to person place and time'   EKG:  Sinus rhythm, rate 68, axis within normal limits, intervals within normal limits, no acute ST-T wave changes.   ASSESSMENT AND PLAN

## 2011-10-26 NOTE — Patient Instructions (Addendum)
The current medical regimen is effective;  continue present plan and medications.  Follow up in 1 year with Dr Hochrein.  You will receive a letter in the mail 2 months before you are due.  Please call us when you receive this letter to schedule your follow up appointment.  

## 2011-10-26 NOTE — Assessment & Plan Note (Signed)
Her blood pressure slightly elevated today. However, she is under emotional stress as this is 5 month anniversary of death of her husband. She says at home it is well controlled. No change in therapy is indicated.

## 2011-10-26 NOTE — Assessment & Plan Note (Signed)
The patient has had some mild palpitations. However, these are not particularly symptomatic and probably related stress. At this point no change in therapy is indicated. No further testing I think would be indicated unless these become more symptomatic.

## 2011-11-08 DIAGNOSIS — IMO0002 Reserved for concepts with insufficient information to code with codable children: Secondary | ICD-10-CM | POA: Diagnosis not present

## 2011-11-10 DIAGNOSIS — IMO0002 Reserved for concepts with insufficient information to code with codable children: Secondary | ICD-10-CM | POA: Diagnosis not present

## 2011-12-08 DIAGNOSIS — E785 Hyperlipidemia, unspecified: Secondary | ICD-10-CM | POA: Diagnosis not present

## 2011-12-08 DIAGNOSIS — M545 Low back pain: Secondary | ICD-10-CM | POA: Diagnosis not present

## 2011-12-08 DIAGNOSIS — M4120 Other idiopathic scoliosis, site unspecified: Secondary | ICD-10-CM | POA: Diagnosis not present

## 2011-12-08 DIAGNOSIS — K5100 Ulcerative (chronic) pancolitis without complications: Secondary | ICD-10-CM | POA: Diagnosis not present

## 2011-12-14 DIAGNOSIS — E8941 Symptomatic postprocedural ovarian failure: Secondary | ICD-10-CM | POA: Diagnosis not present

## 2011-12-14 DIAGNOSIS — R5381 Other malaise: Secondary | ICD-10-CM | POA: Diagnosis not present

## 2011-12-14 DIAGNOSIS — R7989 Other specified abnormal findings of blood chemistry: Secondary | ICD-10-CM | POA: Diagnosis not present

## 2011-12-14 DIAGNOSIS — Z1212 Encounter for screening for malignant neoplasm of rectum: Secondary | ICD-10-CM | POA: Diagnosis not present

## 2011-12-14 DIAGNOSIS — M2550 Pain in unspecified joint: Secondary | ICD-10-CM | POA: Diagnosis not present

## 2011-12-14 DIAGNOSIS — E785 Hyperlipidemia, unspecified: Secondary | ICD-10-CM | POA: Diagnosis not present

## 2012-01-09 DIAGNOSIS — Z1212 Encounter for screening for malignant neoplasm of rectum: Secondary | ICD-10-CM | POA: Diagnosis not present

## 2012-03-12 DIAGNOSIS — K5100 Ulcerative (chronic) pancolitis without complications: Secondary | ICD-10-CM | POA: Diagnosis not present

## 2012-03-12 DIAGNOSIS — M545 Low back pain: Secondary | ICD-10-CM | POA: Diagnosis not present

## 2012-03-20 ENCOUNTER — Encounter: Payer: Self-pay | Admitting: Cardiology

## 2012-03-20 DIAGNOSIS — E559 Vitamin D deficiency, unspecified: Secondary | ICD-10-CM | POA: Diagnosis not present

## 2012-03-20 DIAGNOSIS — Z23 Encounter for immunization: Secondary | ICD-10-CM | POA: Diagnosis not present

## 2012-03-20 DIAGNOSIS — Z79899 Other long term (current) drug therapy: Secondary | ICD-10-CM | POA: Diagnosis not present

## 2012-03-20 DIAGNOSIS — E785 Hyperlipidemia, unspecified: Secondary | ICD-10-CM | POA: Diagnosis not present

## 2012-04-10 ENCOUNTER — Ambulatory Visit (INDEPENDENT_AMBULATORY_CARE_PROVIDER_SITE_OTHER): Payer: Medicare Other | Admitting: Internal Medicine

## 2012-04-17 ENCOUNTER — Other Ambulatory Visit: Payer: Self-pay | Admitting: Dermatology

## 2012-04-17 DIAGNOSIS — C44319 Basal cell carcinoma of skin of other parts of face: Secondary | ICD-10-CM | POA: Diagnosis not present

## 2012-04-17 DIAGNOSIS — L570 Actinic keratosis: Secondary | ICD-10-CM | POA: Diagnosis not present

## 2012-04-17 DIAGNOSIS — L259 Unspecified contact dermatitis, unspecified cause: Secondary | ICD-10-CM | POA: Diagnosis not present

## 2012-04-23 ENCOUNTER — Ambulatory Visit (INDEPENDENT_AMBULATORY_CARE_PROVIDER_SITE_OTHER): Payer: Medicare Other | Admitting: Internal Medicine

## 2012-04-23 ENCOUNTER — Encounter (INDEPENDENT_AMBULATORY_CARE_PROVIDER_SITE_OTHER): Payer: Self-pay | Admitting: Internal Medicine

## 2012-04-23 VITALS — BP 130/74 | HR 76 | Temp 97.6°F | Resp 18 | Ht 62.0 in | Wt 139.5 lb

## 2012-04-23 DIAGNOSIS — K5190 Ulcerative colitis, unspecified, without complications: Secondary | ICD-10-CM | POA: Insufficient documentation

## 2012-04-23 NOTE — Progress Notes (Signed)
Presenting complaint;  Followup for UC.  Subjective:  Lori Soto 76 year old Caucasian female who has chronic ulcerative colitis and here for scheduled visit. She was last seen 6 months ago and was doing well. She has no GI complaints. She generally has one formed stool per day. She denies melena or frank rectal bleeding. She notices hematochezia couple of times a month. His bright red blood and always small in amount. She had blood work done last month and her hemoglobin was 14 g. Her chemistry panel was normal. She has good appetite and always been stable. She had 2 facial skin lesion excited by Dr. Allyson Sabal last week but does not know the biopsy results.  Current Medications: Current Outpatient Prescriptions  Medication Sig Dispense Refill  . aspirin 81 MG tablet Take 81 mg by mouth daily.        [name], Vaccinium myrtillus, (BILBERRY PO) Take 375 mg by mouth daily.        . Calcium Carbonate-Vit D-Min (CALCIUM 1200 PO) Take by mouth 2 (two) times daily.        . Cholecalciferol (VITAMIN D3) 1000 UNITS CAPS Take 5,000 Int'l Units by mouth daily.       . Coenzyme Q10 (CO Q-10 PO) Take by mouth daily. With [employer]      . Cyanocobalamin (VITAMIN B 12 PO) Take 1,000 mcg by mouth daily.       [name] estradiol (ESTRACE) 1 MG tablet Take 1 mg by mouth every other day.       . fluticasone (CUTIVATE) 0.05 % cream Apply 1 application topically 2 (two) times daily.       . Ginkgo Biloba 40 MG TABS Take 120 mg by mouth daily.        [name] glucosamine-chondroitin 500-400 MG tablet Take 1 tablet by mouth 2 (two) times daily.        . Iron-Folic Acid-Vit Z61 (IRON FORMULA PO) Take by mouth. Patient states that she takes this 3 times per week.       . Mesalamine (ASACOL HD) 800 MG TBEC Take 800 mg by mouth. Patient states that she is taking 1 by mouth twice a day      . Misc Natural Products (BUTCHERS BROOM PO) Take by mouth daily.        . MULTIPLE VITAMINS PO Take by mouth daily.        [name]  POTASSIUM GLUCONATE PO Take 99 mg by mouth daily. This is Chelated Potassium -[employer]      . pyridOXINE (VITAMIN B-6) 100 MG tablet Take 100 mg by mouth daily.        . Vitamins C E (CRANBERRY CONCENTRATE PO) Take 4,200 mg by mouth daily.        . Zinc 50 MG CAPS Take by mouth daily.           Objective: Blood pressure 130/74, pulse 76, temperature 97.6 F (36.4 C), temperature source Oral, resp. rate 18, height 5' 2"  (1.575 m), weight 139 lb 8 oz (63.277 kg). Conjunctiva is pink. Sclera is nonicteric Oropharyngeal mucosa is normal. No neck masses or thyromegaly noted. Abdomen is soft and nontender without organomegaly or masses. No LE edema or clubbing noted.  Labs/studies Results: Hemoglobin last one was 14 g.  Assessment:  Chronic ulcerative colitis. Patient appears to be in remission. Occasional hematochezia most likely secondary to hemorrhoids.   Plan:  She will continue Asacol HD at 800 mg by mouth twice a day. Office visit  in 6 months.

## 2012-04-23 NOTE — Progress Notes (Signed)
Presenting complaint;  Followup for UC.  Subjective:  Lori Soto 76 year old Caucasian female who has chronic ulcerative colitis and here for scheduled visit. She was last seen 6 months ago and was doing well. She has no GI complaints. She generally has one formed stool per day. She denies melena or frank rectal bleeding. She notices hematochezia couple of times a month. His bright red blood and always small in amount. She had blood work done last month and her hemoglobin was 14 g. Her chemistry panel was normal. She has good appetite and always been stable. She had 2 facial skin lesion excited by Dr. Allyson Sabal last week but does not know the biopsy results.  Current Medications: Current Outpatient Prescriptions  Medication Sig Dispense Refill  . aspirin 81 MG tablet Take 81 mg by mouth daily.        [name], Vaccinium myrtillus, (BILBERRY PO) Take 375 mg by mouth daily.        . Calcium Carbonate-Vit D-Min (CALCIUM 1200 PO) Take by mouth 2 (two) times daily.        . Cholecalciferol (VITAMIN D3) 1000 UNITS CAPS Take 5,000 Int'l Units by mouth daily.       . Coenzyme Q10 (CO Q-10 PO) Take by mouth daily. With [employer]      . Cyanocobalamin (VITAMIN B 12 PO) Take 1,000 mcg by mouth daily.       [name] estradiol (ESTRACE) 1 MG tablet Take 1 mg by mouth every other day.       . fluticasone (CUTIVATE) 0.05 % cream Apply 1 application topically 2 (two) times daily.       . Ginkgo Biloba 40 MG TABS Take 120 mg by mouth daily.        [name] glucosamine-chondroitin 500-400 MG tablet Take 1 tablet by mouth 2 (two) times daily.        . Iron-Folic Acid-Vit X44 (IRON FORMULA PO) Take by mouth. Patient states that she takes this 3 times per week.       . Mesalamine (ASACOL HD) 800 MG TBEC Take 800 mg by mouth. Patient states that she is taking 1 by mouth twice a day      . Misc Natural Products (BUTCHERS BROOM PO) Take by mouth daily.        . MULTIPLE VITAMINS PO Take by mouth daily.         [name] POTASSIUM GLUCONATE PO Take 99 mg by mouth daily. This is Chelated Potassium -[employer]      . pyridOXINE (VITAMIN B-6) 100 MG tablet Take 100 mg by mouth daily.        . Vitamins C E (CRANBERRY CONCENTRATE PO) Take 4,200 mg by mouth daily.        . Zinc 50 MG CAPS Take by mouth daily.           Objective: Blood pressure 130/74, pulse 76, temperature 97.6 F (36.4 C), temperature source Oral, resp. rate 18, height 5' 2"  (1.575 m), weight 139 lb 8 oz (63.277 kg). Conjunctiva is pink. Sclera is nonicteric Oropharyngeal mucosa is normal. No neck masses or thyromegaly noted. Abdomen is soft and nontender without organomegaly or masses. No LE edema or clubbing noted.  Labs/studies Results: Hemoglobin last one was 14 g.  Assessment:  Chronic ulcerative colitis. Patient appears to be in remission. Occasional hematochezia most likely secondary to hemorrhoids.   Plan:  She will continue Asacol HD at 800 mg by mouth twice a day. Office visit  in 6 months.

## 2012-04-23 NOTE — Patient Instructions (Addendum)
Call if diarrhea relapses

## 2012-05-07 DIAGNOSIS — H2510 Age-related nuclear cataract, unspecified eye: Secondary | ICD-10-CM | POA: Diagnosis not present

## 2012-05-09 ENCOUNTER — Encounter (HOSPITAL_COMMUNITY): Payer: Self-pay | Admitting: Pharmacy Technician

## 2012-05-21 ENCOUNTER — Encounter (HOSPITAL_COMMUNITY)
Admission: RE | Admit: 2012-05-21 | Discharge: 2012-05-21 | Disposition: A | Discharge status: Home / Self Care | Payer: Medicare Other | Source: Ambulatory Visit | Attending: Ophthalmology | Admitting: Ophthalmology

## 2012-05-21 ENCOUNTER — Encounter (HOSPITAL_COMMUNITY): Payer: Self-pay

## 2012-05-21 DIAGNOSIS — H2510 Age-related nuclear cataract, unspecified eye: Secondary | ICD-10-CM | POA: Diagnosis not present

## 2012-05-21 DIAGNOSIS — I10 Essential (primary) hypertension: Secondary | ICD-10-CM | POA: Diagnosis not present

## 2012-05-21 DIAGNOSIS — Z79899 Other long term (current) drug therapy: Secondary | ICD-10-CM | POA: Diagnosis not present

## 2012-05-21 DIAGNOSIS — Z01812 Encounter for preprocedural laboratory examination: Secondary | ICD-10-CM | POA: Diagnosis not present

## 2012-05-21 HISTORY — DX: Personal history of other diseases of the digestive system: Z87.19

## 2012-05-21 HISTORY — DX: Malignant (primary) neoplasm, unspecified: C80.1

## 2012-05-21 HISTORY — DX: Anemia, unspecified: D64.9

## 2012-05-21 LAB — HEMOGLOBIN AND HEMATOCRIT, BLOOD: HCT: 41.6 % (ref 36.0–46.0)

## 2012-05-21 LAB — BASIC METABOLIC PANEL
CO2: 30 mEq/L (ref 19–32)
Glucose, Bld: 92 mg/dL (ref 70–99)
Potassium: 3.9 mEq/L (ref 3.5–5.1)
Sodium: 140 mEq/L (ref 135–145)

## 2012-05-21 NOTE — Patient Instructions (Addendum)
Your procedure is scheduled on: 05/28/2012  Report to [facility] at   0730    AM.  Call this number if you have problems the morning of surgery: [phone]   Do not eat food or drink liquids :After Midnight.      Take these medicines the morning of surgery with A SIP OF WATER: none   Do not wear jewelry, make-up or nail polish.  Do not wear lotions, powders, or perfumes.   Do not shave 48 hours prior to surgery.  Do not bring valuables to the hospital.  Contacts, dentures or bridgework may not be worn into surgery.  Leave suitcase in the car. After surgery it may be brought to your room.  For patients admitted to the hospital, checkout time is 11:00 AM the day of discharge.   Patients discharged the day of surgery will not be allowed to drive home.  :     Please read over the following fact sheets that you were given: Coughing and Deep Breathing, Surgical Site Infection Prevention, Anesthesia Post-op Instructions and Care and Recovery After Surgery    Cataract A cataract is a clouding of the lens of the eye. When a lens becomes cloudy, vision is reduced based on the degree and nature of the clouding. Many cataracts reduce vision to some degree. Some cataracts make people more near-sighted as they develop. Other cataracts increase glare. Cataracts that are ignored and become worse can sometimes look white. The white color can be seen through the pupil. CAUSES   Aging. However, cataracts may occur at any age, even in newborns.   Certain drugs.   Trauma to the eye.   Certain diseases such as diabetes.   Specific eye diseases such as chronic inflammation inside the eye or a sudden attack of a rare form of glaucoma.   Inherited or acquired medical problems.  SYMPTOMS   Gradual, progressive drop in vision in the affected eye.   Severe, rapid visual loss. This most often happens when trauma is the cause.  DIAGNOSIS  To detect a cataract, an eye doctor examines the lens. Cataracts  are best diagnosed with an exam of the eyes with the pupils enlarged (dilated) by drops.  TREATMENT  For an early cataract, vision may improve by using different eyeglasses or stronger lighting. If that does not help your vision, surgery is the only effective treatment. A cataract needs to be surgically removed when vision loss interferes with your everyday activities, such as driving, reading, or watching TV. A cataract may also have to be removed if it prevents examination or treatment of another eye problem. Surgery removes the cloudy lens and usually replaces it with a substitute lens (intraocular lens, IOL).  At a time when both you and your doctor agree, the cataract will be surgically removed. If you have cataracts in both eyes, only one is usually removed at a time. This allows the operated eye to heal and be out of danger from any possible problems after surgery (such as infection or poor wound healing). In rare cases, a cataract may be doing damage to your eye. In these cases, your caregiver may advise surgical removal right away. The vast majority of people who have cataract surgery have better vision afterward. HOME CARE INSTRUCTIONS  If you are not planning surgery, you may be asked to do the following:  Use different eyeglasses.   Use stronger or brighter lighting.   Ask your eye doctor about reducing your medicine dose or  changing medicines if it is thought that a medicine caused your cataract. Changing medicines does not make the cataract go away on its own.   Become familiar with your surroundings. Poor vision can lead to injury. Avoid bumping into things on the affected side. You are at a higher risk for tripping or falling.   Exercise extreme care when driving or operating machinery.   Wear sunglasses if you are sensitive to bright light or experiencing problems with glare.  SEEK IMMEDIATE MEDICAL CARE IF:   You have a worsening or sudden vision loss.   You notice redness,  swelling, or increasing pain in the eye.   You have a fever.  Document Released: 04/18/2005 Document Revised: 04/07/2011 Document Reviewed: 12/10/2010 [facility] Patient Information 2012 [place].PATIENT INSTRUCTIONS POST-ANESTHESIA  IMMEDIATELY FOLLOWING SURGERY:  Do not drive or operate machinery for the first twenty four hours after surgery.  Do not make any important decisions for twenty four hours after surgery or while taking narcotic pain medications or sedatives.  If you develop intractable nausea and vomiting or a severe headache please notify your doctor immediately.  FOLLOW-UP:  Please make an appointment with your surgeon as instructed. You do not need to follow up with anesthesia unless specifically instructed to do so.  WOUND CARE INSTRUCTIONS (if applicable):  Keep a dry clean dressing on the anesthesia/puncture wound site if there is drainage.  Once the wound has quit draining you may leave it open to air.  Generally you should leave the bandage intact for twenty four hours unless there is drainage.  If the epidural site drains for more than 36-48 hours please call the anesthesia department.  QUESTIONS?:  Please feel free to call your physician or the hospital operator if you have any questions, and they will be happy to assist you.

## 2012-05-24 DIAGNOSIS — Z85828 Personal history of other malignant neoplasm of skin: Secondary | ICD-10-CM | POA: Diagnosis not present

## 2012-05-25 MED ORDER — NEOMYCIN-POLYMYXIN-DEXAMETH 3.5-10000-0.1 OP OINT
TOPICAL_OINTMENT | OPHTHALMIC | Status: AC
  Filled 2012-05-25: qty 3.5

## 2012-05-25 MED ORDER — PHENYLEPHRINE HCL 2.5 % OP SOLN
OPHTHALMIC | Status: AC
  Filled 2012-05-25: qty 2

## 2012-05-25 MED ORDER — LIDOCAINE HCL (PF) 1 % IJ SOLN
INTRAMUSCULAR | Status: AC
  Filled 2012-05-25: qty 2

## 2012-05-25 MED ORDER — LIDOCAINE HCL 3.5 % OP GEL
OPHTHALMIC | Status: AC
  Filled 2012-05-25: qty 5

## 2012-05-25 MED ORDER — CYCLOPENTOLATE-PHENYLEPHRINE 0.2-1 % OP SOLN
OPHTHALMIC | Status: AC
  Filled 2012-05-25: qty 2

## 2012-05-25 MED ORDER — TETRACAINE HCL 0.5 % OP SOLN
OPHTHALMIC | Status: AC
  Filled 2012-05-25: qty 2

## 2012-05-28 ENCOUNTER — Encounter (HOSPITAL_COMMUNITY)
Admission: RE | Disposition: A | Discharge status: Home / Self Care | Payer: Self-pay | Source: Ambulatory Visit | Attending: Ophthalmology

## 2012-05-28 ENCOUNTER — Ambulatory Visit (HOSPITAL_COMMUNITY)
Admission: RE | Admit: 2012-05-28 | Discharge: 2012-05-28 | Disposition: A | Discharge status: Home / Self Care | Payer: Medicare Other | Source: Ambulatory Visit | Attending: Ophthalmology | Admitting: Ophthalmology

## 2012-05-28 ENCOUNTER — Encounter (HOSPITAL_COMMUNITY): Payer: Self-pay | Admitting: Anesthesiology

## 2012-05-28 ENCOUNTER — Encounter (HOSPITAL_COMMUNITY): Payer: Self-pay | Admitting: *Deleted

## 2012-05-28 ENCOUNTER — Encounter (HOSPITAL_COMMUNITY): Payer: Self-pay | Admitting: Ophthalmology

## 2012-05-28 ENCOUNTER — Ambulatory Visit (HOSPITAL_COMMUNITY): Payer: Medicare Other | Admitting: Anesthesiology

## 2012-05-28 DIAGNOSIS — I10 Essential (primary) hypertension: Secondary | ICD-10-CM | POA: Diagnosis not present

## 2012-05-28 DIAGNOSIS — Z01812 Encounter for preprocedural laboratory examination: Secondary | ICD-10-CM | POA: Insufficient documentation

## 2012-05-28 DIAGNOSIS — H2510 Age-related nuclear cataract, unspecified eye: Secondary | ICD-10-CM | POA: Diagnosis not present

## 2012-05-28 DIAGNOSIS — H269 Unspecified cataract: Secondary | ICD-10-CM | POA: Diagnosis not present

## 2012-05-28 DIAGNOSIS — Z79899 Other long term (current) drug therapy: Secondary | ICD-10-CM | POA: Diagnosis not present

## 2012-05-28 HISTORY — PX: CATARACT EXTRACTION W/PHACO: SHX586

## 2012-05-28 SURGERY — PHACOEMULSIFICATION, CATARACT, WITH IOL INSERTION
Anesthesia: Monitor Anesthesia Care | Site: Eye | Laterality: Right | Wound class: Clean

## 2012-05-28 MED ORDER — BSS IO SOLN
INTRAOCULAR | Status: DC | PRN
  Administered 2012-05-28: 15 mL via INTRAOCULAR

## 2012-05-28 MED ORDER — LIDOCAINE HCL (PF) 1 % IJ SOLN
INTRAMUSCULAR | Status: DC | PRN
  Administered 2012-05-28: .3 mL

## 2012-05-28 MED ORDER — EPINEPHRINE HCL 1 MG/ML IJ SOLN
INTRAOCULAR | Status: DC | PRN
  Administered 2012-05-28: 09:00:00

## 2012-05-28 MED ORDER — PHENYLEPHRINE HCL 2.5 % OP SOLN
1.0000 [drp] | OPHTHALMIC | Status: AC
  Administered 2012-05-28 (×3): 1 [drp] via OPHTHALMIC

## 2012-05-28 MED ORDER — LIDOCAINE 3.5 % OP GEL OPTIME - NO CHARGE
OPHTHALMIC | Status: DC | PRN
  Administered 2012-05-28: 1 [drp] via OPHTHALMIC

## 2012-05-28 MED ORDER — ONDANSETRON HCL 4 MG/2ML IJ SOLN: 4.0000 mg | Freq: Once | INTRAMUSCULAR | Status: DC | PRN

## 2012-05-28 MED ORDER — MIDAZOLAM HCL 2 MG/2ML IJ SOLN
1.0000 mg | INTRAMUSCULAR | Status: DC | PRN
  Administered 2012-05-28: 2 mg via INTRAVENOUS

## 2012-05-28 MED ORDER — PROVISC 10 MG/ML IO SOLN
INTRAOCULAR | Status: DC | PRN
  Administered 2012-05-28: 8.5 mg via INTRAOCULAR

## 2012-05-28 MED ORDER — POVIDONE-IODINE 5 % OP SOLN
OPHTHALMIC | Status: DC | PRN
  Administered 2012-05-28: 1 via OPHTHALMIC

## 2012-05-28 MED ORDER — FENTANYL CITRATE 0.05 MG/ML IJ SOLN: 25.0000 ug | INTRAMUSCULAR | Status: DC | PRN

## 2012-05-28 MED ORDER — CYCLOPENTOLATE-PHENYLEPHRINE 0.2-1 % OP SOLN
1.0000 [drp] | OPHTHALMIC | Status: AC
  Administered 2012-05-28 (×3): 1 [drp] via OPHTHALMIC

## 2012-05-28 MED ORDER — LACTATED RINGERS IV SOLN
INTRAVENOUS | Status: DC
  Administered 2012-05-28: 09:00:00 via INTRAVENOUS

## 2012-05-28 MED ORDER — NEOMYCIN-POLYMYXIN-DEXAMETH 0.1 % OP OINT
TOPICAL_OINTMENT | OPHTHALMIC | Status: DC | PRN
  Administered 2012-05-28: 1 via OPHTHALMIC

## 2012-05-28 MED ORDER — LIDOCAINE HCL 3.5 % OP GEL
1.0000 "application " | Freq: Once | OPHTHALMIC | Status: AC
  Administered 2012-05-28: 1 via OPHTHALMIC

## 2012-05-28 MED ORDER — EPINEPHRINE HCL 1 MG/ML IJ SOLN
INTRAMUSCULAR | Status: AC
  Filled 2012-05-28: qty 1

## 2012-05-28 MED ORDER — MIDAZOLAM HCL 2 MG/2ML IJ SOLN
INTRAMUSCULAR | Status: AC
  Filled 2012-05-28: qty 2

## 2012-05-28 MED ORDER — TETRACAINE HCL 0.5 % OP SOLN
1.0000 [drp] | OPHTHALMIC | Status: AC
  Administered 2012-05-28 (×3): 1 [drp] via OPHTHALMIC

## 2012-05-28 SURGICAL SUPPLY — 11 items
CLOTH BEACON ORANGE TIMEOUT ST (SAFETY) ×1 IMPLANT
EYE SHIELD UNIVERSAL CLEAR (GAUZE/BANDAGES/DRESSINGS) ×1 IMPLANT
GLOVE BIOGEL PI IND STRL 6.5 (GLOVE) IMPLANT
GLOVE BIOGEL PI INDICATOR 6.5 (GLOVE) ×1
GLOVE EXAM NITRILE MD LF STRL (GLOVE) ×1 IMPLANT
PAD ARMBOARD 7.5X6 YLW CONV (MISCELLANEOUS) ×1 IMPLANT
SIGHTPATH CAT PROC W REG LENS (Ophthalmic Related) ×2 IMPLANT
SYR TB 1ML LL NO SAFETY (SYRINGE) ×1 IMPLANT
TAPE SURG TRANSPORE 1 IN (GAUZE/BANDAGES/DRESSINGS) IMPLANT
TAPE SURGICAL TRANSPORE 1 IN (GAUZE/BANDAGES/DRESSINGS) ×1
WATER STERILE IRR 250ML POUR (IV SOLUTION) ×1 IMPLANT

## 2012-05-28 NOTE — H&P (Signed)
I have reviewed the H&P, the patient was re-examined, and I have identified no interval changes in medical condition and plan of care since the history and physical of record  

## 2012-05-28 NOTE — Brief Op Note (Signed)
Pre-Op Dx: Cataract OD Post-Op Dx: Cataract OD Surgeon: Tonny Branch Anesthesia: Topical with MAC Surgery: Cataract Extraction with Intraocular lens Implant OD Implant: Softec HD Specimen: None Complications: None

## 2012-05-28 NOTE — Anesthesia Preprocedure Evaluation (Addendum)
Anesthesia Evaluation  Patient identified by MRN, date of birth, ID band Patient awake    Reviewed: Allergy & Precautions, H&P , NPO status , Patient's Chart, lab work & pertinent test results  Airway Mallampati: I TM Distance: >3 FB Neck ROM: Full    Dental  (+) Partial Lower and Partial Upper   Pulmonary neg pulmonary ROS,    Pulmonary exam normal       Cardiovascular hypertension, Rhythm:Regular Rate:Normal     Neuro/Psych negative neurological ROS  negative psych ROS   GI/Hepatic Neg liver ROS, hiatal hernia, PUD,   Endo/Other  negative endocrine ROS  Renal/GU negative Renal ROS     Musculoskeletal negative musculoskeletal ROS (+)   Abdominal Normal abdominal exam  (+)   Peds  Hematology negative hematology ROS (+)   Anesthesia Other Findings   Reproductive/Obstetrics                          Anesthesia Physical Anesthesia Plan  ASA: II  Anesthesia Plan: MAC   Post-op Pain Management:    Induction: Intravenous  Airway Management Planned: Nasal Cannula  Additional Equipment:   Intra-op Plan:   Post-operative Plan:   Informed Consent: I have reviewed the patients History and Physical, chart, labs and discussed the procedure including the risks, benefits and alternatives for the proposed anesthesia with the patient or authorized representative who has indicated his/her understanding and acceptance.     Plan Discussed with: CRNA  Anesthesia Plan Comments:         Anesthesia Quick Evaluation

## 2012-05-28 NOTE — Anesthesia Postprocedure Evaluation (Signed)
  Anesthesia Post-op Note  Patient: Lori Soto  Procedure(s) Performed: Procedure(s) (LRB): CATARACT EXTRACTION PHACO AND INTRAOCULAR LENS PLACEMENT (IOC) (Right)  Patient Location:  Short Stay  Anesthesia Type: MAC  Level of Consciousness: awake  Airway and Oxygen Therapy: Patient Spontanous Breathing  Post-op Pain: none  Post-op Assessment: Post-op Vital signs reviewed, Patient's Cardiovascular Status Stable, Respiratory Function Stable, Patent Airway, No signs of Nausea or vomiting and Pain level controlled  Post-op Vital Signs: Reviewed and stable  Complications: No apparent anesthesia complications

## 2012-05-28 NOTE — Anesthesia Procedure Notes (Signed)
Procedure Name: MAC Date/Time: 05/28/2012 9:03 AM Performed by: Vista Deck Pre-anesthesia Checklist: Patient identified, Emergency Drugs available, Suction available, Timeout performed and Patient being monitored Patient Re-evaluated:Patient Re-evaluated prior to inductionOxygen Delivery Method: Nasal Cannula

## 2012-05-28 NOTE — Transfer of Care (Signed)
Immediate Anesthesia Transfer of Care Note  Patient: Lori Soto Lori Soto  Procedure(s) Performed: Procedure(s) (LRB): CATARACT EXTRACTION PHACO AND INTRAOCULAR LENS PLACEMENT (IOC) (Right)  Patient Location: Shortstay  Anesthesia Type: MAC  Level of Consciousness: awake  Airway & Oxygen Therapy: Patient Spontanous Breathing   Post-op Assessment: Report given to PACU RN, Post -op Vital signs reviewed and stable and Patient moving all extremities  Post vital signs: Reviewed and stable  Complications: No apparent anesthesia complications

## 2012-05-29 ENCOUNTER — Encounter (HOSPITAL_COMMUNITY): Payer: Self-pay | Admitting: Ophthalmology

## 2012-05-29 NOTE — Op Note (Signed)
[name], [name]               ACCOUNT NO.:  [id number]  MEDICAL RECORD NO.:  [id number]  LOCATION:  [facility]                          FACILITY:  [facility]  PHYSICIAN:  Richardo Hanks, MD       DATE OF BIRTH:  06/13/1935  DATE OF PROCEDURE:  05/28/2012 DATE OF DISCHARGE:  05/28/2012                              OPERATIVE REPORT   PREOPERATIVE DIAGNOSIS:  Nuclear cataract, right eye, diagnosis code 366.16.  POSTOPERATIVE DIAGNOSIS:  Nuclear cataract, right eye, diagnosis code 366.16.  PROCEDURE:   Phacoemulsification, intraocular lens implantation, right eye.  ANESTHESIA:  Topical with monitored anesthesia care and IV sedation.  OPERATIVE SUMMARY:  In the preoperative area, dilating drops were placed into the right eye.  The patient was then brought into the operating room where she was placed under topical anesthesia and IV sedation.  The eye was then prepped and draped.  Beginning with a 75 blade, a paracentesis port was made at the surgeon's 2 o'clock position.  The anterior chamber was then filled with a 1% nonpreserved lidocaine solution with epinephrine.  This was followed by Viscoat to deepen the chamber.  A small fornix-based peritomy was performed superiorly.  Next, a single iris hook was placed through the limbus superiorly.  A 2.4-mm keratome blade was then used to make a clear corneal incision over the iris hook.  A bent cystotome needle and Utrata forceps were used to create a continuous tear capsulotomy.  Hydrodissection was performed using balanced salt solution on a fine cannula.  The lens nucleus was then removed using phacoemulsification in a quadrant cracking technique. The cortical material was then removed with irrigation and aspiration. The capsular bag and anterior chamber were refilled with Provisc.  The wound was widened to approximately 3 mm and a posterior chamber intraocular lens was placed into the capsular bag without difficulty using an [employer] lens  injecting system.  A single 10-0 nylon suture was then used to close the incision as well as stromal hydration. The Provisc was removed from the anterior chamber and capsular bag with irrigation and aspiration.  At this point, the wounds were tested for leak, which were negative.  The anterior chamber remained deep and stable.  The patient tolerated the procedure well.  There were no operative complications, and she awoke from topical anesthesia and IV sedation without problem.  There were no surgical specimens.  Prosthetic device used is a Lenstec posterior chamber lens, model Softtech HD, power of 20.0, serial number is [id number].         ______________________________ Richardo Hanks, MD    KEH/MEDQ  D:  05/28/2012  T:  05/28/2012  Job:  [id number]

## 2012-06-01 ENCOUNTER — Encounter (HOSPITAL_COMMUNITY): Payer: Self-pay | Admitting: Pharmacy Technician

## 2012-06-04 DIAGNOSIS — H02839 Dermatochalasis of unspecified eye, unspecified eyelid: Secondary | ICD-10-CM | POA: Diagnosis not present

## 2012-06-04 DIAGNOSIS — H2589 Other age-related cataract: Secondary | ICD-10-CM | POA: Diagnosis not present

## 2012-06-04 DIAGNOSIS — Z961 Presence of intraocular lens: Secondary | ICD-10-CM | POA: Diagnosis not present

## 2012-06-05 ENCOUNTER — Encounter (HOSPITAL_COMMUNITY)
Admission: RE | Admit: 2012-06-05 | Discharge: 2012-06-05 | Disposition: A | Discharge status: Home / Self Care | Payer: Medicare Other | Source: Ambulatory Visit | Attending: Ophthalmology | Admitting: Ophthalmology

## 2012-06-06 MED ORDER — PHENYLEPHRINE HCL 2.5 % OP SOLN
OPHTHALMIC | Status: AC
  Filled 2012-06-06: qty 2

## 2012-06-06 MED ORDER — TETRACAINE HCL 0.5 % OP SOLN
OPHTHALMIC | Status: AC
  Filled 2012-06-06: qty 2

## 2012-06-06 MED ORDER — NEOMYCIN-POLYMYXIN-DEXAMETH 3.5-10000-0.1 OP OINT
TOPICAL_OINTMENT | OPHTHALMIC | Status: AC
  Filled 2012-06-06: qty 3.5

## 2012-06-06 MED ORDER — LIDOCAINE HCL 3.5 % OP GEL
OPHTHALMIC | Status: AC
  Filled 2012-06-06: qty 5

## 2012-06-06 MED ORDER — CYCLOPENTOLATE-PHENYLEPHRINE 0.2-1 % OP SOLN
OPHTHALMIC | Status: AC
  Filled 2012-06-06: qty 2

## 2012-06-06 MED ORDER — LIDOCAINE HCL (PF) 1 % IJ SOLN
INTRAMUSCULAR | Status: AC
  Filled 2012-06-06: qty 2

## 2012-06-07 ENCOUNTER — Encounter (HOSPITAL_COMMUNITY)
Admission: RE | Disposition: A | Discharge status: Home / Self Care | Payer: Self-pay | Source: Ambulatory Visit | Attending: Ophthalmology

## 2012-06-07 ENCOUNTER — Encounter (HOSPITAL_COMMUNITY): Payer: Self-pay | Admitting: Anesthesiology

## 2012-06-07 ENCOUNTER — Ambulatory Visit (HOSPITAL_COMMUNITY)
Admission: RE | Admit: 2012-06-07 | Discharge: 2012-06-07 | Disposition: A | Discharge status: Home / Self Care | Payer: Medicare Other | Source: Ambulatory Visit | Attending: Ophthalmology | Admitting: Ophthalmology

## 2012-06-07 ENCOUNTER — Encounter (HOSPITAL_COMMUNITY): Payer: Self-pay | Admitting: *Deleted

## 2012-06-07 ENCOUNTER — Ambulatory Visit (HOSPITAL_COMMUNITY): Payer: Medicare Other | Admitting: Anesthesiology

## 2012-06-07 DIAGNOSIS — I10 Essential (primary) hypertension: Secondary | ICD-10-CM | POA: Diagnosis not present

## 2012-06-07 DIAGNOSIS — H2510 Age-related nuclear cataract, unspecified eye: Secondary | ICD-10-CM | POA: Insufficient documentation

## 2012-06-07 DIAGNOSIS — H2589 Other age-related cataract: Secondary | ICD-10-CM | POA: Diagnosis not present

## 2012-06-07 DIAGNOSIS — H269 Unspecified cataract: Secondary | ICD-10-CM | POA: Diagnosis not present

## 2012-06-07 HISTORY — PX: CATARACT EXTRACTION W/PHACO: SHX586

## 2012-06-07 SURGERY — PHACOEMULSIFICATION, CATARACT, WITH IOL INSERTION
Anesthesia: Monitor Anesthesia Care | Site: Eye | Laterality: Left | Wound class: Clean

## 2012-06-07 MED ORDER — LIDOCAINE HCL 3.5 % OP GEL
1.0000 "application " | Freq: Once | OPHTHALMIC | Status: AC
  Administered 2012-06-07: 1 via OPHTHALMIC

## 2012-06-07 MED ORDER — LACTATED RINGERS IV SOLN
INTRAVENOUS | Status: DC
  Administered 2012-06-07: 1000 mL via INTRAVENOUS

## 2012-06-07 MED ORDER — FENTANYL CITRATE 0.05 MG/ML IJ SOLN: 25.0000 ug | INTRAMUSCULAR | Status: DC | PRN

## 2012-06-07 MED ORDER — POVIDONE-IODINE 5 % OP SOLN
OPHTHALMIC | Status: DC | PRN
  Administered 2012-06-07: 1 via OPHTHALMIC

## 2012-06-07 MED ORDER — BSS IO SOLN
INTRAOCULAR | Status: DC | PRN
  Administered 2012-06-07: 15 mL via INTRAOCULAR

## 2012-06-07 MED ORDER — CYCLOPENTOLATE-PHENYLEPHRINE 0.2-1 % OP SOLN
1.0000 [drp] | OPHTHALMIC | Status: AC
  Administered 2012-06-07 (×3): 1 [drp] via OPHTHALMIC

## 2012-06-07 MED ORDER — MIDAZOLAM HCL 2 MG/2ML IJ SOLN
1.0000 mg | INTRAMUSCULAR | Status: DC | PRN
  Administered 2012-06-07: 2 mg via INTRAVENOUS

## 2012-06-07 MED ORDER — ONDANSETRON HCL 4 MG/2ML IJ SOLN: 4.0000 mg | Freq: Once | INTRAMUSCULAR | Status: DC | PRN

## 2012-06-07 MED ORDER — PROVISC 10 MG/ML IO SOLN
INTRAOCULAR | Status: DC | PRN
  Administered 2012-06-07: 8.5 mg via INTRAOCULAR

## 2012-06-07 MED ORDER — LACTATED RINGERS IV SOLN
INTRAVENOUS | Status: DC | PRN
  Administered 2012-06-07: 08:00:00 via INTRAVENOUS

## 2012-06-07 MED ORDER — TETRACAINE HCL 0.5 % OP SOLN
1.0000 [drp] | OPHTHALMIC | Status: AC
  Administered 2012-06-07 (×3): 1 [drp] via OPHTHALMIC

## 2012-06-07 MED ORDER — PHENYLEPHRINE HCL 2.5 % OP SOLN
1.0000 [drp] | OPHTHALMIC | Status: AC
  Administered 2012-06-07 (×3): 1 [drp] via OPHTHALMIC

## 2012-06-07 MED ORDER — EPINEPHRINE HCL 1 MG/ML IJ SOLN
INTRAMUSCULAR | Status: AC
  Filled 2012-06-07: qty 1

## 2012-06-07 MED ORDER — MIDAZOLAM HCL 2 MG/2ML IJ SOLN
INTRAMUSCULAR | Status: AC
  Filled 2012-06-07: qty 2

## 2012-06-07 MED ORDER — EPINEPHRINE HCL 1 MG/ML IJ SOLN
INTRAOCULAR | Status: DC | PRN
  Administered 2012-06-07: 08:00:00

## 2012-06-07 MED ORDER — LIDOCAINE HCL (PF) 1 % IJ SOLN
INTRAMUSCULAR | Status: DC | PRN
  Administered 2012-06-07: .4 mL

## 2012-06-07 MED ORDER — NEOMYCIN-POLYMYXIN-DEXAMETH 0.1 % OP OINT
TOPICAL_OINTMENT | OPHTHALMIC | Status: DC | PRN
  Administered 2012-06-07: 1 via OPHTHALMIC

## 2012-06-07 SURGICAL SUPPLY — 32 items
CAPSULAR TENSION RING-AMO (OPHTHALMIC RELATED) IMPLANT
CLOTH BEACON ORANGE TIMEOUT ST (SAFETY) ×1 IMPLANT
EYE SHIELD UNIVERSAL CLEAR (GAUZE/BANDAGES/DRESSINGS) ×1 IMPLANT
GLOVE BIO SURGEON STRL SZ 6.5 (GLOVE) IMPLANT
GLOVE BIOGEL PI IND STRL 6.5 (GLOVE) IMPLANT
GLOVE BIOGEL PI IND STRL 7.0 (GLOVE) IMPLANT
GLOVE BIOGEL PI IND STRL 7.5 (GLOVE) IMPLANT
GLOVE BIOGEL PI INDICATOR 6.5 (GLOVE) ×1
GLOVE BIOGEL PI INDICATOR 7.0 (GLOVE)
GLOVE BIOGEL PI INDICATOR 7.5 (GLOVE)
GLOVE ECLIPSE 6.5 STRL STRAW (GLOVE) IMPLANT
GLOVE ECLIPSE 7.0 STRL STRAW (GLOVE) IMPLANT
GLOVE ECLIPSE 7.5 STRL STRAW (GLOVE) IMPLANT
GLOVE EXAM NITRILE LRG STRL (GLOVE) ×1 IMPLANT
GLOVE EXAM NITRILE MD LF STRL (GLOVE) IMPLANT
GLOVE SKINSENSE NS SZ6.5 (GLOVE)
GLOVE SKINSENSE NS SZ7.0 (GLOVE)
GLOVE SKINSENSE STRL SZ6.5 (GLOVE) IMPLANT
GLOVE SKINSENSE STRL SZ7.0 (GLOVE) IMPLANT
KIT VITRECTOMY (OPHTHALMIC RELATED) IMPLANT
PAD ARMBOARD 7.5X6 YLW CONV (MISCELLANEOUS) ×1 IMPLANT
PROC W NO LENS (INTRAOCULAR LENS)
PROC W SPEC LENS (INTRAOCULAR LENS)
PROCESS W NO LENS (INTRAOCULAR LENS) IMPLANT
PROCESS W SPEC LENS (INTRAOCULAR LENS) IMPLANT
RING MALYGIN (MISCELLANEOUS) IMPLANT
SIGHTPATH CAT PROC W REG LENS (Ophthalmic Related) ×2 IMPLANT
SYR TB 1ML LL NO SAFETY (SYRINGE) ×1 IMPLANT
TAPE SURG TRANSPORE 1 IN (GAUZE/BANDAGES/DRESSINGS) IMPLANT
TAPE SURGICAL TRANSPORE 1 IN (GAUZE/BANDAGES/DRESSINGS) ×1
VISCOELASTIC ADDITIONAL (OPHTHALMIC RELATED) IMPLANT
WATER STERILE IRR 250ML POUR (IV SOLUTION) ×1 IMPLANT

## 2012-06-07 NOTE — Anesthesia Procedure Notes (Signed)
Procedure Name: MAC Date/Time: 06/07/2012 8:13 AM Performed by: Antony Contras, AMY L Pre-anesthesia Checklist: Patient identified, Patient being monitored, Emergency Drugs available, Timeout performed and Suction available Oxygen Delivery Method: Nasal cannula

## 2012-06-07 NOTE — Preoperative (Signed)
Beta Blockers   Reason not to administer Beta Blockers:Not Applicable 

## 2012-06-07 NOTE — Anesthesia Postprocedure Evaluation (Signed)
  Anesthesia Post-op Note  Patient: Lori Soto  Procedure(s) Performed: Procedure(s) (LRB) with comments: CATARACT EXTRACTION PHACO AND INTRAOCULAR LENS PLACEMENT (IOC) (Left) - CDE 17.60  Patient Location: PACU  Anesthesia Type: MAC  Level of Consciousness: awake, alert , oriented and patient cooperative  Airway and Oxygen Therapy: Patient Spontanous Breathing room air  Post-op Pain: mild  Post-op Assessment: Post-op Vital signs reviewed, Patient's Cardiovascular Status Stable, Respiratory Function Stable, Patent Airway and No signs of Nausea or vomiting  Post-op Vital Signs: Reviewed and stable  Complications: No apparent anesthesia complications

## 2012-06-07 NOTE — Brief Op Note (Signed)
Pre-Op Dx: Cataract OS Post-Op Dx: Cataract OS Surgeon: Tonny Branch Anesthesia: Topical with MAC Surgery: Cataract Extraction with Intraocular lens Implant OS Implant: Lenstec, SoftecHD Specimen: None Complications: None

## 2012-06-07 NOTE — Transfer of Care (Signed)
  Anesthesia Post-op Note  Patient: Lori Soto  Procedure(s) Performed: Procedure(s) (LRB) with comments: CATARACT EXTRACTION PHACO AND INTRAOCULAR LENS PLACEMENT (IOC) (Left) - CDE 17.60  Patient Location: PACU  Anesthesia Type: MAC  Level of Consciousness: awake, alert , oriented and patient cooperative  Airway and Oxygen Therapy: Patient Spontanous Breathing room air  Post-op Pain: mild  Post-op Assessment: Post-op Vital signs reviewed, Patient's Cardiovascular Status Stable, Respiratory Function Stable, Patent Airway and No signs of Nausea or vomiting  Post-op Vital Signs: Reviewed and stable  Complications: No apparent anesthesia complications

## 2012-06-07 NOTE — Anesthesia Preprocedure Evaluation (Signed)
Anesthesia Evaluation  Patient identified by MRN, date of birth, ID band Patient awake    Reviewed: Allergy & Precautions, H&P , NPO status , Patient's Chart, lab work & pertinent test results  Airway Mallampati: I TM Distance: >3 FB Neck ROM: Full    Dental  (+) Partial Lower and Partial Upper   Pulmonary neg pulmonary ROS,    Pulmonary exam normal       Cardiovascular hypertension, Rhythm:Regular Rate:Normal     Neuro/Psych negative neurological ROS  negative psych ROS   GI/Hepatic Neg liver ROS, hiatal hernia, PUD,   Endo/Other  negative endocrine ROS  Renal/GU negative Renal ROS     Musculoskeletal negative musculoskeletal ROS (+)   Abdominal Normal abdominal exam  (+)   Peds  Hematology negative hematology ROS (+)   Anesthesia Other Findings   Reproductive/Obstetrics                           Anesthesia Physical Anesthesia Plan  ASA: II  Anesthesia Plan: MAC   Post-op Pain Management:    Induction: Intravenous  Airway Management Planned: Nasal Cannula  Additional Equipment:   Intra-op Plan:   Post-operative Plan:   Informed Consent: I have reviewed the patients History and Physical, chart, labs and discussed the procedure including the risks, benefits and alternatives for the proposed anesthesia with the patient or authorized representative who has indicated his/her understanding and acceptance.     Plan Discussed with: CRNA  Anesthesia Plan Comments:         Anesthesia Quick Evaluation

## 2012-06-07 NOTE — H&P (Signed)
I have reviewed the H&P, the patient was re-examined, and I have identified no interval changes in medical condition and plan of care since the history and physical of record  

## 2012-06-08 ENCOUNTER — Encounter (HOSPITAL_COMMUNITY): Payer: Self-pay | Admitting: Ophthalmology

## 2012-06-08 NOTE — Op Note (Signed)
Lori Soto, WOODRING               ACCOUNT NO.:  [id number]  MEDICAL RECORD NO.:  [id number]  LOCATION:  [facility]                          FACILITY:  [facility]  PHYSICIAN:  Richardo Hanks, MD       DATE OF BIRTH:  [birth year]  DATE OF PROCEDURE:  06/07/2012 DATE OF DISCHARGE:  06/07/2012                              OPERATIVE REPORT   PREOPERATIVE DIAGNOSIS:  Combined cataract, left eye, diagnosis code 366.19.  POSTOPERATIVE DIAGNOSIS:  Combined cataract, left eye, diagnosis code 366.19.  OPERATION PERFORMED:  Phacoemulsification of posterior chamber intraocular lens implantation, left eye.  ANESTHESIA:  Topical with monitored anesthesia care and IV sedation.  OPERATIVE SUMMARY:  In the preoperative area, dilating drops were placed into the left eye.  The patient was then brought into the operating room where she was placed under general anesthesia.  The eye was then prepped and draped.  Beginning with a 75 blade, a paracentesis port was made at the surgeon's 2 o'clock position.  The anterior chamber was then filled with a 1% nonpreserved lidocaine solution with epinephrine.  This was followed by Viscoat to deepen the chamber.  A small fornix-based peritomy was performed superiorly.  Next, a single iris hook was placed through the limbus superiorly.  A 2.4-mm keratome blade was then used to make a clear corneal incision over the iris hook.  A bent cystotome needle and Utrata forceps were used to create a continuous tear capsulotomy.  Hydrodissection was performed using balanced salt solution on a fine cannula.  The lens nucleus was then removed using phacoemulsification in a quadrant cracking technique.  The cortical material was then removed with irrigation and aspiration.  The capsular bag and anterior chamber were refilled with Provisc.  The wound was widened to approximately 3 mm and a posterior chamber intraocular lens was placed into the capsular bag without difficulty using an  [employer] lens injecting system.  A single 10-0 nylon suture was then used to close the incision as well as stromal hydration.  The Provisc was removed from the anterior chamber and capsular bag with irrigation and aspiration.  At this point, the wounds were tested for leak, which were negative.  The anterior chamber remained deep and stable.  The patient tolerated the procedure well.  There were no operative complications.  No surgical specimens.  Prosthetic device used is a Lenstec posterior chamber lens, model Softech HD, power of 18.5, serial number is [id number].          ______________________________ Richardo Hanks, MD    KEH/MEDQ  D:  06/07/2012  T:  06/08/2012  Job:  [id number]

## 2012-06-26 DIAGNOSIS — M4120 Other idiopathic scoliosis, site unspecified: Secondary | ICD-10-CM | POA: Diagnosis not present

## 2012-06-26 DIAGNOSIS — K449 Diaphragmatic hernia without obstruction or gangrene: Secondary | ICD-10-CM | POA: Diagnosis not present

## 2012-07-19 ENCOUNTER — Other Ambulatory Visit (INDEPENDENT_AMBULATORY_CARE_PROVIDER_SITE_OTHER): Payer: Self-pay | Admitting: Internal Medicine

## 2012-07-24 DIAGNOSIS — M4120 Other idiopathic scoliosis, site unspecified: Secondary | ICD-10-CM | POA: Diagnosis not present

## 2012-08-06 ENCOUNTER — Ambulatory Visit: Payer: Medicare Other | Admitting: Physical Therapy

## 2012-08-09 ENCOUNTER — Ambulatory Visit: Payer: Medicare Other | Attending: Orthopedic Surgery | Admitting: Physical Therapy

## 2012-08-09 DIAGNOSIS — R293 Abnormal posture: Secondary | ICD-10-CM | POA: Insufficient documentation

## 2012-08-09 DIAGNOSIS — M545 Low back pain, unspecified: Secondary | ICD-10-CM | POA: Insufficient documentation

## 2012-08-09 DIAGNOSIS — M2560 Stiffness of unspecified joint, not elsewhere classified: Secondary | ICD-10-CM | POA: Insufficient documentation

## 2012-08-09 DIAGNOSIS — R5381 Other malaise: Secondary | ICD-10-CM | POA: Insufficient documentation

## 2012-08-09 DIAGNOSIS — IMO0001 Reserved for inherently not codable concepts without codable children: Secondary | ICD-10-CM | POA: Diagnosis not present

## 2012-08-14 ENCOUNTER — Ambulatory Visit: Payer: Medicare Other | Admitting: *Deleted

## 2012-08-14 DIAGNOSIS — R5381 Other malaise: Secondary | ICD-10-CM | POA: Diagnosis not present

## 2012-08-14 DIAGNOSIS — M545 Low back pain: Secondary | ICD-10-CM | POA: Diagnosis not present

## 2012-08-14 DIAGNOSIS — IMO0001 Reserved for inherently not codable concepts without codable children: Secondary | ICD-10-CM | POA: Diagnosis not present

## 2012-08-14 DIAGNOSIS — R293 Abnormal posture: Secondary | ICD-10-CM | POA: Diagnosis not present

## 2012-08-14 DIAGNOSIS — M2560 Stiffness of unspecified joint, not elsewhere classified: Secondary | ICD-10-CM | POA: Diagnosis not present

## 2012-08-16 ENCOUNTER — Ambulatory Visit: Payer: Medicare Other | Admitting: Physical Therapy

## 2012-08-16 DIAGNOSIS — R5381 Other malaise: Secondary | ICD-10-CM | POA: Diagnosis not present

## 2012-08-16 DIAGNOSIS — IMO0001 Reserved for inherently not codable concepts without codable children: Secondary | ICD-10-CM | POA: Diagnosis not present

## 2012-08-16 DIAGNOSIS — M545 Low back pain: Secondary | ICD-10-CM | POA: Diagnosis not present

## 2012-08-16 DIAGNOSIS — R293 Abnormal posture: Secondary | ICD-10-CM | POA: Diagnosis not present

## 2012-08-16 DIAGNOSIS — M2560 Stiffness of unspecified joint, not elsewhere classified: Secondary | ICD-10-CM | POA: Diagnosis not present

## 2012-08-21 ENCOUNTER — Ambulatory Visit: Payer: Medicare Other | Admitting: Physical Therapy

## 2012-08-21 DIAGNOSIS — R5381 Other malaise: Secondary | ICD-10-CM | POA: Diagnosis not present

## 2012-08-21 DIAGNOSIS — R293 Abnormal posture: Secondary | ICD-10-CM | POA: Diagnosis not present

## 2012-08-21 DIAGNOSIS — M2560 Stiffness of unspecified joint, not elsewhere classified: Secondary | ICD-10-CM | POA: Diagnosis not present

## 2012-08-21 DIAGNOSIS — M545 Low back pain: Secondary | ICD-10-CM | POA: Diagnosis not present

## 2012-08-21 DIAGNOSIS — IMO0001 Reserved for inherently not codable concepts without codable children: Secondary | ICD-10-CM | POA: Diagnosis not present

## 2012-08-23 DIAGNOSIS — Z85828 Personal history of other malignant neoplasm of skin: Secondary | ICD-10-CM | POA: Diagnosis not present

## 2012-08-27 ENCOUNTER — Ambulatory Visit: Payer: Medicare Other | Admitting: Physical Therapy

## 2012-08-27 DIAGNOSIS — IMO0001 Reserved for inherently not codable concepts without codable children: Secondary | ICD-10-CM | POA: Diagnosis not present

## 2012-08-27 DIAGNOSIS — M2560 Stiffness of unspecified joint, not elsewhere classified: Secondary | ICD-10-CM | POA: Diagnosis not present

## 2012-08-27 DIAGNOSIS — R293 Abnormal posture: Secondary | ICD-10-CM | POA: Diagnosis not present

## 2012-08-27 DIAGNOSIS — M545 Low back pain: Secondary | ICD-10-CM | POA: Diagnosis not present

## 2012-08-27 DIAGNOSIS — R5381 Other malaise: Secondary | ICD-10-CM | POA: Diagnosis not present

## 2012-09-03 ENCOUNTER — Ambulatory Visit: Payer: Medicare Other | Attending: Orthopedic Surgery | Admitting: *Deleted

## 2012-09-03 DIAGNOSIS — M545 Low back pain, unspecified: Secondary | ICD-10-CM | POA: Insufficient documentation

## 2012-09-03 DIAGNOSIS — M2560 Stiffness of unspecified joint, not elsewhere classified: Secondary | ICD-10-CM | POA: Insufficient documentation

## 2012-09-03 DIAGNOSIS — R5381 Other malaise: Secondary | ICD-10-CM | POA: Diagnosis not present

## 2012-09-03 DIAGNOSIS — IMO0001 Reserved for inherently not codable concepts without codable children: Secondary | ICD-10-CM | POA: Diagnosis not present

## 2012-09-03 DIAGNOSIS — R293 Abnormal posture: Secondary | ICD-10-CM | POA: Diagnosis not present

## 2012-09-06 ENCOUNTER — Ambulatory Visit: Payer: Medicare Other | Admitting: Physical Therapy

## 2012-09-06 DIAGNOSIS — IMO0001 Reserved for inherently not codable concepts without codable children: Secondary | ICD-10-CM | POA: Diagnosis not present

## 2012-09-06 DIAGNOSIS — M2560 Stiffness of unspecified joint, not elsewhere classified: Secondary | ICD-10-CM | POA: Diagnosis not present

## 2012-09-06 DIAGNOSIS — M545 Low back pain: Secondary | ICD-10-CM | POA: Diagnosis not present

## 2012-09-06 DIAGNOSIS — R5381 Other malaise: Secondary | ICD-10-CM | POA: Diagnosis not present

## 2012-09-06 DIAGNOSIS — R293 Abnormal posture: Secondary | ICD-10-CM | POA: Diagnosis not present

## 2012-09-12 ENCOUNTER — Ambulatory Visit (INDEPENDENT_AMBULATORY_CARE_PROVIDER_SITE_OTHER): Payer: Medicare Other | Admitting: Cardiology

## 2012-09-12 VITALS — BP 132/79 | HR 92 | Ht 63.0 in | Wt 140.0 lb

## 2012-09-12 DIAGNOSIS — R011 Cardiac murmur, unspecified: Secondary | ICD-10-CM | POA: Diagnosis not present

## 2012-09-12 NOTE — Progress Notes (Signed)
HPI The patient presents for evaluation of some palpitations. Since I last saw her the patient has had no new symptoms. The patient denies any new symptoms such as chest discomfort, neck or arm discomfort. There has been no new shortness of breath, PND or orthopnea. There have been no reported palpitations, presyncope or syncope.  She does all her own housework without limitations.   Allergies  Allergen Reactions  . Influenza Virus Vacc Split Pf Rash    Per Patient she was told by her PCP not to ever take this injection again  . Penicillins Itching and Swelling    Patient states that she had a rash also    Current Outpatient Prescriptions  Medication Sig Dispense Refill  . ASACOL HD 800 MG TBEC TAKE 2 TABLETS TWICE DAILY  360 tablet  3  . aspirin 81 MG tablet Take 81 mg by mouth daily.        [name], Vaccinium myrtillus, (BILBERRY PO) Take 375 mg by mouth daily.        . Calcium Carbonate-Vit D-Min (CALCIUM 1200 PO) Take by mouth 2 (two) times daily.        [name] 500 MG CAPS Take by mouth.      . Cholecalciferol (VITAMIN D3) 1000 UNITS CAPS Take 5,000 Int'l Units by mouth daily.       . Coenzyme Q10 (CO Q-10 PO) Take by mouth daily. With red yeast rice [employer]      . Cyanocobalamin (VITAMIN B 12 PO) Take 1,000 mcg by mouth daily.       [name] estradiol (ESTRACE) 1 MG tablet Take 1 mg by mouth every other day.       . Ginkgo Biloba 40 MG TABS Take 120 mg by mouth daily.        [name] glucosamine-chondroitin 500-400 MG tablet Take 1 tablet by mouth 2 (two) times daily.        . Iron-Folic Acid-Vit P53 (IRON FORMULA PO) Take by mouth. Patient states that she takes this 3 times per week.       . Misc Natural Products (BUTCHERS BROOM PO) Take by mouth daily.        . MULTIPLE VITAMINS PO Take by mouth daily.        . Nutritional Supplements (GRAPESEED EXTRACT PO) Take by mouth. 60 mg 1 tab a day      . POTASSIUM GLUCONATE PO Take 99 mg by mouth daily. This is Chelated Potassium  -[employer]      . pyridOXINE (VITAMIN B-6) 100 MG tablet Take 50 mg by mouth daily.       . Vitamins C E (CRANBERRY CONCENTRATE PO) Take 4,200 mg by mouth daily.        . Zinc 50 MG CAPS Take by mouth daily.         No current facility-administered medications for this visit.    Past Medical History  Diagnosis Date  . Ulcerative colitis   . Shingles   . Cataract   . H/O hiatal hernia   . Cancer     skin cancer on nose  . Anemia     Past Surgical History  Procedure Laterality Date  . Colonoscopy    . Upper gastrointestinal endoscopy    . Bunionectomy      Bilateral  . Hernia      Right inguinal  . Abdominal hysterectomy    . Hernia repair  1961    right inguinal hernia  .  Cataract extraction w/phaco  05/28/2012    Procedure: CATARACT EXTRACTION PHACO AND INTRAOCULAR LENS PLACEMENT (IOC);  Surgeon: Tonny Branch, MD;  Location: [facility];  Service: Ophthalmology;  Laterality: Right;  CDE=12.84  . Cataract extraction w/phaco  06/07/2012    Procedure: CATARACT EXTRACTION PHACO AND INTRAOCULAR LENS PLACEMENT (IOC);  Surgeon: Tonny Branch, MD;  Location: [facility];  Service: Ophthalmology;  Laterality: Left;  CDE 17.60    ROS:  As stated in the history of present illness and negative for all other systems.  PHYSICAL EXAM BP 132/79  Pulse 92  Ht 5' 3"  (1.6 m)  Wt 140 lb (63.504 kg)  BMI 24.81 kg/m2 GENERAL:  Well appearing NECK:  No jugular venous distention, waveform within normal limits, carotid upstroke brisk and symmetric, no bruits, no thyromegaly LUNGS:  Clear to auscultation bilaterally CHEST:  Unremarkable HEART:  PMI not displaced or sustained,S1 and S2 within normal limits, no S3, no S4, no clicks, no rubs, no murmurs ABD:  Flat, positive bowel sounds normal in frequency in pitch, no bruits, no rebound, no guarding, no midline pulsatile mass, no hepatomegaly, no splenomegaly EXT:  2 plus pulses throughout, no edema, no cyanosis no clubbing   EKG:  Sinus rhythm, axis  within normal limits, intervals within normal limits, no acute ST-T wave changes.  09/12/2012   ASSESSMENT AND PLAN  PALPITATIONS:  She's having no further symptoms. No further evaluation is indicated.  HTN:  The blood pressure is at target. No change in medications is indicated. We will continue with therapeutic lifestyle changes (TLC).  AORTIC SCLEROSIS:  This was noted on a distant echo. She has no symptoms and no murmur.  No further imaging is indicated.

## 2012-09-12 NOTE — Patient Instructions (Addendum)
The current medical regimen is effective;  continue present plan and medications.  Follow up as needed

## 2012-09-13 ENCOUNTER — Ambulatory Visit: Payer: Medicare Other | Admitting: Physical Therapy

## 2012-09-13 DIAGNOSIS — IMO0001 Reserved for inherently not codable concepts without codable children: Secondary | ICD-10-CM | POA: Diagnosis not present

## 2012-09-13 DIAGNOSIS — M545 Low back pain: Secondary | ICD-10-CM | POA: Diagnosis not present

## 2012-09-13 DIAGNOSIS — R293 Abnormal posture: Secondary | ICD-10-CM | POA: Diagnosis not present

## 2012-09-13 DIAGNOSIS — M2560 Stiffness of unspecified joint, not elsewhere classified: Secondary | ICD-10-CM | POA: Diagnosis not present

## 2012-09-13 DIAGNOSIS — R5381 Other malaise: Secondary | ICD-10-CM | POA: Diagnosis not present

## 2012-09-15 ENCOUNTER — Encounter: Payer: Self-pay | Admitting: Cardiology

## 2012-09-18 ENCOUNTER — Ambulatory Visit: Payer: Medicare Other | Admitting: Physical Therapy

## 2012-09-18 DIAGNOSIS — R293 Abnormal posture: Secondary | ICD-10-CM | POA: Diagnosis not present

## 2012-09-18 DIAGNOSIS — M2560 Stiffness of unspecified joint, not elsewhere classified: Secondary | ICD-10-CM | POA: Diagnosis not present

## 2012-09-18 DIAGNOSIS — R5381 Other malaise: Secondary | ICD-10-CM | POA: Diagnosis not present

## 2012-09-18 DIAGNOSIS — M545 Low back pain: Secondary | ICD-10-CM | POA: Diagnosis not present

## 2012-09-18 DIAGNOSIS — IMO0001 Reserved for inherently not codable concepts without codable children: Secondary | ICD-10-CM | POA: Diagnosis not present

## 2012-09-20 ENCOUNTER — Ambulatory Visit: Payer: Medicare Other | Admitting: Physical Therapy

## 2012-09-20 DIAGNOSIS — R5381 Other malaise: Secondary | ICD-10-CM | POA: Diagnosis not present

## 2012-09-20 DIAGNOSIS — M545 Low back pain: Secondary | ICD-10-CM | POA: Diagnosis not present

## 2012-09-20 DIAGNOSIS — M2560 Stiffness of unspecified joint, not elsewhere classified: Secondary | ICD-10-CM | POA: Diagnosis not present

## 2012-09-20 DIAGNOSIS — R293 Abnormal posture: Secondary | ICD-10-CM | POA: Diagnosis not present

## 2012-09-20 DIAGNOSIS — IMO0001 Reserved for inherently not codable concepts without codable children: Secondary | ICD-10-CM | POA: Diagnosis not present

## 2012-09-25 ENCOUNTER — Ambulatory Visit: Payer: Medicare Other | Admitting: Physical Therapy

## 2012-09-25 DIAGNOSIS — IMO0001 Reserved for inherently not codable concepts without codable children: Secondary | ICD-10-CM | POA: Diagnosis not present

## 2012-09-25 DIAGNOSIS — R293 Abnormal posture: Secondary | ICD-10-CM | POA: Diagnosis not present

## 2012-09-25 DIAGNOSIS — R5381 Other malaise: Secondary | ICD-10-CM | POA: Diagnosis not present

## 2012-09-25 DIAGNOSIS — M2560 Stiffness of unspecified joint, not elsewhere classified: Secondary | ICD-10-CM | POA: Diagnosis not present

## 2012-09-25 DIAGNOSIS — M545 Low back pain: Secondary | ICD-10-CM | POA: Diagnosis not present

## 2012-09-27 ENCOUNTER — Ambulatory Visit: Payer: Medicare Other | Admitting: Physical Therapy

## 2012-09-27 DIAGNOSIS — R5381 Other malaise: Secondary | ICD-10-CM | POA: Diagnosis not present

## 2012-09-27 DIAGNOSIS — M2560 Stiffness of unspecified joint, not elsewhere classified: Secondary | ICD-10-CM | POA: Diagnosis not present

## 2012-09-27 DIAGNOSIS — M545 Low back pain: Secondary | ICD-10-CM | POA: Diagnosis not present

## 2012-09-27 DIAGNOSIS — R293 Abnormal posture: Secondary | ICD-10-CM | POA: Diagnosis not present

## 2012-09-27 DIAGNOSIS — IMO0001 Reserved for inherently not codable concepts without codable children: Secondary | ICD-10-CM | POA: Diagnosis not present

## 2012-10-01 DIAGNOSIS — Z1231 Encounter for screening mammogram for malignant neoplasm of breast: Secondary | ICD-10-CM | POA: Diagnosis not present

## 2012-10-04 DIAGNOSIS — G894 Chronic pain syndrome: Secondary | ICD-10-CM | POA: Diagnosis not present

## 2012-10-08 ENCOUNTER — Ambulatory Visit: Payer: Medicare Other | Attending: Orthopedic Surgery | Admitting: Physical Therapy

## 2012-10-08 DIAGNOSIS — R293 Abnormal posture: Secondary | ICD-10-CM | POA: Diagnosis not present

## 2012-10-08 DIAGNOSIS — IMO0001 Reserved for inherently not codable concepts without codable children: Secondary | ICD-10-CM | POA: Diagnosis not present

## 2012-10-08 DIAGNOSIS — R5381 Other malaise: Secondary | ICD-10-CM | POA: Insufficient documentation

## 2012-10-08 DIAGNOSIS — M545 Low back pain, unspecified: Secondary | ICD-10-CM | POA: Insufficient documentation

## 2012-10-08 DIAGNOSIS — M2560 Stiffness of unspecified joint, not elsewhere classified: Secondary | ICD-10-CM | POA: Diagnosis not present

## 2012-10-22 ENCOUNTER — Ambulatory Visit (INDEPENDENT_AMBULATORY_CARE_PROVIDER_SITE_OTHER): Payer: Medicare Other | Admitting: Internal Medicine

## 2012-11-08 ENCOUNTER — Ambulatory Visit (INDEPENDENT_AMBULATORY_CARE_PROVIDER_SITE_OTHER): Payer: Medicare Other | Admitting: Internal Medicine

## 2012-11-08 ENCOUNTER — Encounter (INDEPENDENT_AMBULATORY_CARE_PROVIDER_SITE_OTHER): Payer: Self-pay | Admitting: Internal Medicine

## 2012-11-08 VITALS — BP 124/72 | HR 76 | Temp 97.7°F | Resp 18 | Ht 62.0 in | Wt 140.3 lb

## 2012-11-08 DIAGNOSIS — K5190 Ulcerative colitis, unspecified, without complications: Secondary | ICD-10-CM

## 2012-11-08 DIAGNOSIS — K589 Irritable bowel syndrome without diarrhea: Secondary | ICD-10-CM

## 2012-11-08 MED ORDER — HYOSCYAMINE SULFATE 0.125 MG SL SUBL: 0.1250 mg | SUBLINGUAL_TABLET | SUBLINGUAL | Status: DC | PRN

## 2012-11-08 MED ORDER — MESALAMINE 800 MG PO TBEC: 1600.0000 mg | DELAYED_RELEASE_TABLET | Freq: Two times a day (BID) | ORAL | Status: DC

## 2012-11-08 NOTE — Progress Notes (Addendum)
Presenting complaint;  Followup for ulcerative colitis.  Subjective:  Patient is 77 year old Caucasian female who has chronic ulcerative colitis and was last seen on 04/23/2012. She was doing well on her last visit and appeared to be in remission. Now she comes in stating that she has relapsed. She is having at least three bowel movements daily. Most of her bowel movements occur within one to two hours of waking up. Stool consistency is soft. She denies rectal bleeding. Last time she saw blood with her bowel movements was over 3 months ago. She also denies abdominal pain fever nausea or vomiting. Her appetite is normal in her weight has been stable. She has been under a lot of stress since her brother-in-law was diagnosed with lung cancer and it brought memories of her late husband's ordeal with lung cancer. She is getting therapy for back pain and scoliosis. She does not take NSAIDs.  Current Medications: Current Outpatient Prescriptions  Medication Sig Dispense Refill  . ASACOL HD 800 MG TBEC TAKE 2 TABLETS TWICE DAILY  360 tablet  3  . aspirin 81 MG tablet Take 81 mg by mouth daily.        [name], Vaccinium myrtillus, (BILBERRY PO) Take 375 mg by mouth daily.        . Calcium Carbonate-Vit D-Min (CALCIUM 1200 PO) Take by mouth 2 (two) times daily.        [name] 500 MG CAPS Take by mouth.      . Coenzyme Q10 (CO Q-10 PO) Take by mouth daily. With red yeast rice [employer]      . Cyanocobalamin (VITAMIN B 12 PO) Take 1,000 mcg by mouth daily.       [name] estradiol (ESTRACE) 1 MG tablet Take 1 mg by mouth every other day.       . Ginkgo Biloba 40 MG TABS Take 120 mg by mouth daily.        [name] glucosamine-chondroitin 500-400 MG tablet Take 1 tablet by mouth 2 (two) times daily.        . Iron-Folic Acid-Vit S92 (IRON FORMULA PO) Take by mouth. Patient states that she takes this 3 times per week.       . Misc Natural Products (BUTCHERS BROOM PO) Take by mouth daily.        .  MULTIPLE VITAMINS PO Take by mouth daily.        . Nutritional Supplements (GRAPESEED EXTRACT PO) Take by mouth. 60 mg 1 tab a day      . POTASSIUM GLUCONATE PO Take 99 mg by mouth daily. This is Chelated Potassium -[employer]      . pyridOXINE (VITAMIN B-6) 100 MG tablet Take 50 mg by mouth daily.       . Vitamins C E (CRANBERRY CONCENTRATE PO) Take 4,200 mg by mouth daily.        . Zinc 50 MG CAPS Take by mouth daily.         No current facility-administered medications for this visit.     Objective: Blood pressure 124/72, pulse 76, temperature 97.7 F (36.5 C), temperature source Oral, resp. rate 18, height 5' 2"  (1.575 m), weight 140 lb 4.8 oz (63.64 kg). Patient is alert and in no acute distress. Conjunctiva is pink. Sclera is nonicteric Oropharyngeal mucosa is normal. No neck masses or thyromegaly noted. Cardiac exam with regular rhythm normal S1 and S2. Faint SEM at LLSB. Lungs are clear to auscultation. Abdomen is soft and nontender without  organomegaly or masses. Rectal examination reveals scant amount of stool is guaiac-negative. No LE edema or clubbing noted.    Assessment: Patient appears to be in remission as far as UC is concerned. Ongoing symptoms are suggestive of irritable bowel syndrome which she has history of. She has been on Levbid in the past.    Plan:  Patient reassured. Hyoscyamine sublingual one to two tablets by mouth every morning when necessary. She will continue Asacol HD and 1.6 g by mouth twice a day. Office visit in 6 months.

## 2012-11-08 NOTE — Patient Instructions (Signed)
Can take hyoscyamine one to 2 tablets sublingual every morning as needed or twice daily.

## 2012-11-15 DIAGNOSIS — G894 Chronic pain syndrome: Secondary | ICD-10-CM | POA: Diagnosis not present

## 2012-11-15 DIAGNOSIS — M4120 Other idiopathic scoliosis, site unspecified: Secondary | ICD-10-CM | POA: Diagnosis not present

## 2012-11-15 DIAGNOSIS — M545 Low back pain: Secondary | ICD-10-CM | POA: Diagnosis not present

## 2012-11-15 DIAGNOSIS — M546 Pain in thoracic spine: Secondary | ICD-10-CM | POA: Diagnosis not present

## 2012-12-12 ENCOUNTER — Other Ambulatory Visit: Payer: Self-pay | Admitting: Dermatology

## 2012-12-12 DIAGNOSIS — C44319 Basal cell carcinoma of skin of other parts of face: Secondary | ICD-10-CM | POA: Diagnosis not present

## 2012-12-17 ENCOUNTER — Telehealth: Payer: Self-pay | Admitting: Family Medicine

## 2012-12-18 NOTE — Telephone Encounter (Signed)
Patient is aware has no appointment

## 2013-02-04 DIAGNOSIS — Z85828 Personal history of other malignant neoplasm of skin: Secondary | ICD-10-CM | POA: Diagnosis not present

## 2013-02-04 DIAGNOSIS — L570 Actinic keratosis: Secondary | ICD-10-CM | POA: Diagnosis not present

## 2013-04-08 DIAGNOSIS — M79609 Pain in unspecified limb: Secondary | ICD-10-CM | POA: Diagnosis not present

## 2013-04-08 DIAGNOSIS — M779 Enthesopathy, unspecified: Secondary | ICD-10-CM | POA: Diagnosis not present

## 2013-04-30 DIAGNOSIS — H40059 Ocular hypertension, unspecified eye: Secondary | ICD-10-CM | POA: Diagnosis not present

## 2013-05-06 DIAGNOSIS — Z85828 Personal history of other malignant neoplasm of skin: Secondary | ICD-10-CM | POA: Diagnosis not present

## 2013-05-14 ENCOUNTER — Encounter (INDEPENDENT_AMBULATORY_CARE_PROVIDER_SITE_OTHER): Payer: Self-pay | Admitting: Internal Medicine

## 2013-05-14 ENCOUNTER — Ambulatory Visit (INDEPENDENT_AMBULATORY_CARE_PROVIDER_SITE_OTHER): Payer: Medicare Other | Admitting: Internal Medicine

## 2013-05-14 VITALS — BP 138/78 | HR 64 | Temp 97.4°F | Resp 20 | Ht 63.0 in | Wt 139.8 lb

## 2013-05-14 DIAGNOSIS — K5190 Ulcerative colitis, unspecified, without complications: Secondary | ICD-10-CM

## 2013-05-14 DIAGNOSIS — G8929 Other chronic pain: Secondary | ICD-10-CM

## 2013-05-14 DIAGNOSIS — M545 Low back pain, unspecified: Secondary | ICD-10-CM | POA: Insufficient documentation

## 2013-05-14 LAB — COMPREHENSIVE METABOLIC PANEL
ALBUMIN: 4.2 g/dL (ref 3.5–5.2)
ALK PHOS: 31 U/L — AB (ref 39–117)
ALT: 16 U/L (ref 0–35)
AST: 21 U/L (ref 0–37)
BILIRUBIN TOTAL: 0.5 mg/dL (ref 0.3–1.2)
BUN: 11 mg/dL (ref 6–23)
CO2: 31 mEq/L (ref 19–32)
Calcium: 9.9 mg/dL (ref 8.4–10.5)
Chloride: 102 mEq/L (ref 96–112)
Creat: 0.69 mg/dL (ref 0.50–1.10)
GLUCOSE: 91 mg/dL (ref 70–99)
Potassium: 4.5 mEq/L (ref 3.5–5.3)
Sodium: 140 mEq/L (ref 135–145)
Total Protein: 7.2 g/dL (ref 6.0–8.3)

## 2013-05-14 LAB — CBC
HEMATOCRIT: 41.5 % (ref 36.0–46.0)
HEMOGLOBIN: 14 g/dL (ref 12.0–15.0)
MCH: 29.4 pg (ref 26.0–34.0)
MCHC: 33.7 g/dL (ref 30.0–36.0)
MCV: 87 fL (ref 78.0–100.0)
Platelets: 308 10*3/uL (ref 150–400)
RBC: 4.77 MIL/uL (ref 3.87–5.11)
RDW: 14 % (ref 11.5–15.5)
WBC: 5.8 10*3/uL (ref 4.0–10.5)

## 2013-05-14 MED ORDER — MESALAMINE 800 MG PO TBEC: 1600.0000 mg | DELAYED_RELEASE_TABLET | Freq: Two times a day (BID) | ORAL | Status: DC

## 2013-05-14 NOTE — Patient Instructions (Signed)
Hemoccult x1. Physician will contact you with the results of blood work when completed

## 2013-05-14 NOTE — Progress Notes (Signed)
Presenting complaint;  Followup for ulcerative colitis.  Subjective:  Patient is 78 year old Caucasian female who has history of ulcerative colitis and is here for scheduled visit. She was last seen 6 months ago. She feels well. She has 1-2 formed stools daily. She notices blood on tissue when she gets constipated which is not often. She has not experienced abdominal pain lately but she still has Levsin prescription. She has chronic low-back pain secondary to scoliosis and goes to the gym at least 3 times a week.  Current Medications: Current Outpatient Prescriptions  Medication Sig Dispense Refill  . aspirin 81 MG tablet Take 81 mg by mouth daily.        [name], Vaccinium myrtillus, (BILBERRY PO) Take 375 mg by mouth daily.        . Calcium Carbonate-Vit D-Min (CALCIUM 1200 PO) Take by mouth 2 (two) times daily.        [name] 500 MG CAPS Take by mouth.      . Coenzyme Q10 (CO Q-10 PO) Take by mouth daily. With red yeast rice [employer]      . Cyanocobalamin (VITAMIN B 12 PO) Take 1,000 mcg by mouth daily.       [name] estradiol (ESTRACE) 1 MG tablet Take 1 mg by mouth every other day.       . Ginkgo Biloba 40 MG TABS Take 120 mg by mouth daily.        [name] glucosamine-chondroitin 500-400 MG tablet Take 1 tablet by mouth 2 (two) times daily.        . Iron-Folic Acid-Vit Y24 (IRON FORMULA PO) Take by mouth. Patient states that she takes this 3 times per week.       . Mesalamine (ASACOL HD) 800 MG TBEC Take 2 tablets (1,600 mg total) by mouth 2 (two) times daily.  360 tablet  3  . Misc Natural Products (BUTCHERS BROOM PO) Take by mouth daily.        . MULTIPLE VITAMINS PO Take by mouth daily.        . Nutritional Supplements (GRAPESEED EXTRACT PO) Take by mouth. 60 mg 1 tab a day      . POTASSIUM GLUCONATE PO Take 99 mg by mouth daily. This is Chelated Potassium -[employer]      . pyridOXINE (VITAMIN B-6) 100 MG tablet Take 50 mg by mouth daily.       . Vitamins C E (CRANBERRY  CONCENTRATE PO) Take 4,200 mg by mouth daily.        . Zinc 50 MG CAPS Take by mouth daily.        . hyoscyamine (LEVSIN SL) 0.125 MG SL tablet Place 1 tablet (0.125 mg total) under the tongue every 4 (four) hours as needed for cramping.  30 tablet  0   No current facility-administered medications for this visit.     Objective: Blood pressure 138/78, pulse 64, temperature 97.4 F (36.3 C), temperature source Oral, resp. rate 20, height 5' 3"  (1.6 m), weight 139 lb 12.8 oz (63.413 kg). Patient is alert and in no acute distress. Conjunctiva is pink. Sclera is nonicteric Oropharyngeal mucosa is normal. No neck masses or thyromegaly noted. Cardiac exam with regular rhythm normal S1 and S2. No murmur or gallop noted. Lungs are clear to auscultation. Abdomen symmetrical soft and nontender without organomegaly or masses. No LE edema or clubbing noted.    Assessment:  #1. Chronic ulcerative colitis. Patient appears to be in remission. She is due  for lab studies. Last colonoscopy was in 06/02/10 and cecum was not reached.    Plan:  Continue Asacol HD at 1.6 g by mouth twice a day. Hemoccult x1. CBC and comprehensive chemistry panel. Office visit in 6 months.

## 2013-06-17 ENCOUNTER — Telehealth (INDEPENDENT_AMBULATORY_CARE_PROVIDER_SITE_OTHER): Payer: Self-pay | Admitting: *Deleted

## 2013-06-17 NOTE — Telephone Encounter (Signed)
Hemoccult results noted; it is negative

## 2013-06-17 NOTE — Telephone Encounter (Signed)
   Diagnosis:    Result(s)   Card 1: Negative           Completed by: Thomas Hoff , LPN   HEMOCCULT SENSA DEVELOPER: LOT#:  [id number] EXPIRATION DATE: 2016-05   HEMOCCULT SENSA CARD:  LOT#: [id number]  EXPIRATION DATE: 04/16   CARD CONTROL RESULTS:  POSITIVE: Positive NEGATIVE: Negative    ADDITIONAL COMMENTS:  Patient was called and a message was left with the hemoccult result on her answering machine.

## 2013-10-03 DIAGNOSIS — Z1231 Encounter for screening mammogram for malignant neoplasm of breast: Secondary | ICD-10-CM | POA: Diagnosis not present

## 2013-11-11 ENCOUNTER — Ambulatory Visit (INDEPENDENT_AMBULATORY_CARE_PROVIDER_SITE_OTHER): Payer: Medicare Other | Admitting: Internal Medicine

## 2013-11-11 ENCOUNTER — Encounter (INDEPENDENT_AMBULATORY_CARE_PROVIDER_SITE_OTHER): Payer: Self-pay | Admitting: Internal Medicine

## 2013-11-11 VITALS — BP 118/80 | HR 68 | Temp 97.9°F | Resp 16 | Ht 63.0 in | Wt 137.7 lb

## 2013-11-11 DIAGNOSIS — Z85828 Personal history of other malignant neoplasm of skin: Secondary | ICD-10-CM | POA: Diagnosis not present

## 2013-11-11 DIAGNOSIS — D235 Other benign neoplasm of skin of trunk: Secondary | ICD-10-CM | POA: Diagnosis not present

## 2013-11-11 DIAGNOSIS — K5190 Ulcerative colitis, unspecified, without complications: Secondary | ICD-10-CM | POA: Diagnosis not present

## 2013-11-11 NOTE — Patient Instructions (Signed)
Notify if you have rectal bleeding

## 2013-11-11 NOTE — Progress Notes (Addendum)
Presenting complaint;  Followup for diverticulitis.  Subjective:  Patient is 78 year old Caucasian female with history of ulcerative colitis is here for scheduled visit. She was last seen 6 months ago. She says she is doing well. She has no GI complaints. Her bowels move once or twice daily. On most days she has one formed stool. She denies abdominal pain melena or rectal bleeding. Her appetite is good. Her weight is down by 2 pounds. She has not taken Levsin in over one year. She goes to gym 3 times a week and does exercises to help with chronic back pain. Since her last visit she's had bilateral cataract surgery. Right one was done in January and the left one in February this year.   Current Medications: Outpatient Encounter Prescriptions as of 11/11/2013  Medication Sig  . aspirin 81 MG tablet Take 81 mg by mouth daily.    . Calcium Carbonate-Vit D-Min (CALCIUM 1200 PO) Take by mouth 2 (two) times daily.    [name] 500 MG CAPS Take by mouth.  . Coenzyme Q10 (CO Q-10 PO) Take by mouth daily. With red yeast rice Puritans Pride Brand  . COLLAGEN PO Take by mouth 3 (three) times daily. This is known as 1-2-3  . Cyanocobalamin (VITAMIN B 12 PO) Take 1,000 mcg by mouth daily.   [name] estradiol (ESTRACE) 1 MG tablet Take 1 mg by mouth every other day.   . Ginkgo Biloba 40 MG TABS Take 120 mg by mouth daily.    [name] glucosamine-chondroitin 500-400 MG tablet Take 1 tablet by mouth 2 (two) times daily.    . hyoscyamine (LEVSIN SL) 0.125 MG SL tablet Place 1 tablet (0.125 mg total) under the tongue every 4 (four) hours as needed for cramping.  . Iron-Folic Acid-Vit L38 (IRON FORMULA PO) Take by mouth. Patient states that she takes this 3 times per week.   . Mesalamine (ASACOL HD) 800 MG TBEC Take 2 tablets (1,600 mg total) by mouth 2 (two) times daily.  . Misc Natural Products (BUTCHERS BROOM PO) Take by mouth daily.    . MULTIPLE VITAMINS PO Take by mouth daily.    . Nutritional Supplements (GRAPESEED  EXTRACT PO) Take by mouth. 60 mg 1 tab a day  . OVER THE COUNTER MEDICATION daily. Vision Gold Luten 66 mg & Billberry 20 mg  . POTASSIUM GLUCONATE PO Take 99 mg by mouth daily. This is Chelated Potassium -Puritans Pride  . pyridOXINE (VITAMIN B-6) 100 MG tablet Take 100 mg by mouth daily.   . Vitamins C E (CRANBERRY CONCENTRATE PO) Take 2,500 mg by mouth daily.   . Zinc 50 MG CAPS Take 25 mg by mouth daily.   . [DISCONTINUED] Bilberry, Vaccinium myrtillus, (BILBERRY PO) Take 375 mg by mouth daily.       Objective: Blood pressure 118/80, pulse 68, temperature 97.9 F (36.6 C), temperature source Oral, resp. rate 16, height 5' 3"  (1.6 m), weight 137 lb 11.2 oz (62.46 kg). Patient is alert and in no acute distress. Conjunctiva is pink. Sclera is nonicteric Oropharyngeal mucosa is normal. No neck masses or thyromegaly noted. Cardiac exam with regular rhythm normal S1 and S2. No murmur or gallop noted. Lungs are clear to auscultation. Abdomen symmetrical soft and nontender without organomegaly or masses.  No LE edema or clubbing noted.   Assessment:  #1. Chronic ulcerative colitis. She remains in remission. Last colonoscopy was in January 2012 when she had pan colitis exam was limited to hepatic flexure.  Plan:  Patient will continue Asacol HD and 1600 mg by mouth twice a day. Levsin deleted from list of her medications. Office visit in 6 months at which time will consider surveillance colonoscopy.

## 2013-11-15 ENCOUNTER — Ambulatory Visit (INDEPENDENT_AMBULATORY_CARE_PROVIDER_SITE_OTHER): Payer: Medicare Other | Admitting: Pharmacist

## 2013-11-15 ENCOUNTER — Encounter: Payer: Self-pay | Admitting: Pharmacist

## 2013-11-15 VITALS — BP 140/78 | HR 70 | Ht 63.0 in | Wt 136.8 lb

## 2013-11-15 DIAGNOSIS — Z0000 Encounter for general adult medical examination without abnormal findings: Secondary | ICD-10-CM | POA: Diagnosis not present

## 2013-11-15 NOTE — Patient Instructions (Addendum)
Health Maintenance Summary    PNEUMOCOCCAL - Prevnar 13 Postponed 01/14/2014     ZOSTAVAX - Shingles Postponed 01/14/2014     TETANUS/TDAP Postponed 01/14/2014     INFLUENZA VACCINE Next Due 11/30/2013     Eye Exam Next Due  04/2014    DEXA / Bone density Due 12/2011 Referral made    MAMMOGRAM Next Due 10/03/2014      COLONOSCOPY Next Due 05/13/2020        Preventive Care for Adults A healthy lifestyle and preventive care can promote health and wellness. Preventive health guidelines for women include the following key practices.  A routine yearly physical is a good way to check with your health care provider about your health and preventive screening. It is a chance to share any concerns and updates on your health and to receive a thorough exam.  Visit your dentist for a routine exam and preventive care every 6 months. Brush your teeth twice a day and floss once a day. Good oral hygiene prevents tooth decay and gum disease.  The frequency of eye exams is based on your age, health, family medical history, use of contact lenses, and other factors. Follow your health care provider's recommendations for frequency of eye exams.  Eat a healthy diet. Foods like vegetables, fruits, whole grains, low-fat dairy products, and lean protein foods contain the nutrients you need without too many calories. Decrease your intake of foods high in solid fats, added sugars, and salt. Eat the right amount of calories for you.Get information about a proper diet from your health care provider, if necessary.  Regular physical exercise is one of the most important things you can do for your health. Most adults should get at least 150 minutes of moderate-intensity exercise (any activity that increases your heart rate and causes you to sweat) each week. In addition, most adults need muscle-strengthening exercises on 2 or more days a week.  Maintain a healthy weight. The body mass index (BMI) is a screening tool to identify  possible weight problems. It provides an estimate of body fat based on height and weight. Your health care provider can find your BMI, and can help you achieve or maintain a healthy weight.For adults 20 years and older:  A BMI below 18.5 is considered underweight.  A BMI of 18.5 to 24.9 is normal.  A BMI of 25 to 29.9 is considered overweight.  A BMI of 30 and above is considered obese.  Maintain normal blood lipids and cholesterol levels by exercising and minimizing your intake of saturated fat. Eat a balanced diet with plenty of fruit and vegetables. Blood tests for lipids and cholesterol should begin at age 75 and be repeated every 5 years. If your lipid or cholesterol levels are high, you are over 50, or you are at high risk for heart disease, you may need your cholesterol levels checked more frequently.Ongoing high lipid and cholesterol levels should be treated with medicines if diet and exercise are not working.  If you smoke, find out from your health care provider how to quit. If you do not use tobacco, do not start.  Lung cancer screening is recommended for adults aged 76-80 years who are at high risk for developing lung cancer because of a history of smoking. A yearly low-dose CT scan of the lungs is recommended for people who have at least a 30-pack-year history of smoking and are a current smoker or have quit within the past 15 years. A pack year of  smoking is smoking an average of 1 pack of cigarettes a day for 1 year (for example: 1 pack a day for 30 years or 2 packs a day for 15 years). Yearly screening should continue until the smoker has stopped smoking for at least 15 years. Yearly screening should be stopped for people who develop a health problem that would prevent them from having lung cancer treatment.  If you are pregnant, do not drink alcohol. If you are breastfeeding, be very cautious about drinking alcohol. If you are not pregnant and choose to drink alcohol, do not have  more than 1 drink per day. One drink is considered to be 12 ounces (355 mL) of beer, 5 ounces (148 mL) of wine, or 1.5 ounces (44 mL) of liquor.  Avoid use of street drugs. Do not share needles with anyone. Ask for help if you need support or instructions about stopping the use of drugs.  High blood pressure causes heart disease and increases the risk of stroke. Your blood pressure should be checked at least every 1 to 2 years. Ongoing high blood pressure should be treated with medicines if weight loss and exercise do not work.  If you are 56-3 years old, ask your health care provider if you should take aspirin to prevent strokes.  Diabetes screening involves taking a blood sample to check your fasting blood sugar level. This should be done once every 3 years, after age 14, if you are within normal weight and without risk factors for diabetes. Testing should be considered at a younger age or be carried out more frequently if you are overweight and have at least 1 risk factor for diabetes.  Breast cancer screening is essential preventive care for women. You should practice "breast self-awareness." This means understanding the normal appearance and feel of your breasts and may include breast self-examination. Any changes detected, no matter how small, should be reported to a health care provider. Women in their 71s and 30s should have a clinical breast exam (CBE) by a health care provider as part of a regular health exam every 1 to 3 years. After age 65, women should have a CBE every year. Starting at age 22, women should consider having a mammogram (breast X-ray test) every year. Women who have a family history of breast cancer should talk to their health care provider about genetic screening. Women at a high risk of breast cancer should talk to their health care providers about having an MRI and a mammogram every year.  Breast cancer gene (BRCA)-related cancer risk assessment is recommended for women  who have family members with BRCA-related cancers. BRCA-related cancers include breast, ovarian, tubal, and peritoneal cancers. Having family members with these cancers may be associated with an increased risk for harmful changes (mutations) in the breast cancer genes BRCA1 and BRCA2. Results of the assessment will determine the need for genetic counseling and BRCA1 and BRCA2 testing.  Routine pelvic exams to screen for cancer are no longer recommended for nonpregnant women who are considered low risk for cancer of the pelvic organs (ovaries, uterus, and vagina) and who do not have symptoms. Ask your health care provider if a screening pelvic exam is right for you.  If you have had past treatment for cervical cancer or a condition that could lead to cancer, you need Pap tests and screening for cancer for at least 20 years after your treatment. If Pap tests have been discontinued, your risk factors (such as having a new sexual  partner) need to be reassessed to determine if screening should be resumed. Some women have medical problems that increase the chance of getting cervical cancer. In these cases, your health care provider may recommend more frequent screening and Pap tests.  The HPV test is an additional test that may be used for cervical cancer screening. The HPV test looks for the virus that can cause the cell changes on the cervix. The cells collected during the Pap test can be tested for HPV. The HPV test could be used to screen women aged 64 years and older, and should be used in women of any age who have unclear Pap test results. After the age of 85, women should have HPV testing at the same frequency as a Pap test.  Colorectal cancer can be detected and often prevented. Most routine colorectal cancer screening begins at the age of 61 years and continues through age 66 years. However, your health care provider may recommend screening at an earlier age if you have risk factors for colon cancer. On a  yearly basis, your health care provider may provide home test kits to check for hidden blood in the stool. Use of a small camera at the end of a tube, to directly examine the colon (sigmoidoscopy or colonoscopy), can detect the earliest forms of colorectal cancer. Talk to your health care provider about this at age 30, when routine screening begins. Direct exam of the colon should be repeated every 5-10 years through age 35 years, unless early forms of pre-cancerous polyps or small growths are found.  People who are at an increased risk for hepatitis B should be screened for this virus. You are considered at high risk for hepatitis B if:  You were born in a country where hepatitis B occurs often. Talk with your health care provider about which countries are considered high risk.  Your parents were born in a high-risk country and you have not received a shot to protect against hepatitis B (hepatitis B vaccine).  You have HIV or AIDS.  You use needles to inject street drugs.  You live with, or have sex with, someone who has Hepatitis B.  You get hemodialysis treatment.  You take certain medicines for conditions like cancer, organ transplantation, and autoimmune conditions.  Hepatitis C blood testing is recommended for all people born from [birth year] through 1963 and any individual with known risks for hepatitis C.  Practice safe sex. Use condoms and avoid high-risk sexual practices to reduce the spread of sexually transmitted infections (STIs). STIs include gonorrhea, chlamydia, syphilis, trichomonas, herpes, HPV, and human immunodeficiency virus (HIV). Herpes, HIV, and HPV are viral illnesses that have no cure. They can result in disability, cancer, and death.  You should be screened for sexually transmitted illnesses (STIs) including gonorrhea and chlamydia if:  You are sexually active and are younger than 24 years.  You are older than 24 years and your health care provider tells you that you  are at risk for this type of infection.  Your sexual activity has changed since you were last screened and you are at an increased risk for chlamydia or gonorrhea. Ask your health care provider if you are at risk.  If you are at risk of being infected with HIV, it is recommended that you take a prescription medicine daily to prevent HIV infection. This is called preexposure prophylaxis (PrEP). You are considered at risk if:  You are a heterosexual woman, are sexually active, and are at increased risk  for HIV infection.  You take drugs by injection.  You are sexually active with a partner who has HIV.  Talk with your health care provider about whether you are at high risk of being infected with HIV. If you choose to begin PrEP, you should first be tested for HIV. You should then be tested every 3 months for as long as you are taking PrEP.  Osteoporosis is a disease in which the bones lose minerals and strength with aging. This can result in serious bone fractures or breaks. The risk of osteoporosis can be identified using a bone density scan. Women ages 41 years and over and women at risk for fractures or osteoporosis should discuss screening with their health care providers. Ask your health care provider whether you should take a calcium supplement or vitamin D to reduce the rate of osteoporosis.  Menopause can be associated with physical symptoms and risks. Hormone replacement therapy is available to decrease symptoms and risks. You should talk to your health care provider about whether hormone replacement therapy is right for you.  Use sunscreen. Apply sunscreen liberally and repeatedly throughout the day. You should seek shade when your shadow is shorter than you. Protect yourself by wearing long sleeves, pants, a wide-brimmed hat, and sunglasses year round, whenever you are outdoors.  Once a month, do a whole body skin exam, using a mirror to look at the skin on your back. Tell your health  care provider of new moles, moles that have irregular borders, moles that are larger than a pencil eraser, or moles that have changed in shape or color.  Stay current with required vaccines (immunizations).  Influenza vaccine. All adults should be immunized every year.  Tetanus, diphtheria, and acellular pertussis (Td, Tdap) vaccine. Pregnant women should receive 1 dose of Tdap vaccine during each pregnancy. The dose should be obtained regardless of the length of time since the last dose. Immunization is preferred during the 27th-36th week of gestation. An adult who has not previously received Tdap or who does not know her vaccine status should receive 1 dose of Tdap. This initial dose should be followed by tetanus and diphtheria toxoids (Td) booster doses every 10 years. Adults with an unknown or incomplete history of completing a 3-dose immunization series with Td-containing vaccines should begin or complete a primary immunization series including a Tdap dose. Adults should receive a Td booster every 10 years.  Varicella vaccine. An adult without evidence of immunity to varicella should receive 2 doses or a second dose if she has previously received 1 dose. Pregnant females who do not have evidence of immunity should receive the first dose after pregnancy. This first dose should be obtained before leaving the health care facility. The second dose should be obtained 4-8 weeks after the first dose.  Human papillomavirus (HPV) vaccine. Females aged 13-26 years who have not received the vaccine previously should obtain the 3-dose series. The vaccine is not recommended for use in pregnant females. However, pregnancy testing is not needed before receiving a dose. If a female is found to be pregnant after receiving a dose, no treatment is needed. In that case, the remaining doses should be delayed until after the pregnancy. Immunization is recommended for any person with an immunocompromised condition through  the age of 3 years if she did not get any or all doses earlier. During the 3-dose series, the second dose should be obtained 4-8 weeks after the first dose. The third dose should be obtained 24  weeks after the first dose and 16 weeks after the second dose.  Zoster vaccine. One dose is recommended for adults aged 20 years or older unless certain conditions are present.  Measles, mumps, and rubella (MMR) vaccine. Adults born before [birth year] generally are considered immune to measles and mumps. Adults born in [birth year] or later should have 1 or more doses of MMR vaccine unless there is a contraindication to the vaccine or there is laboratory evidence of immunity to each of the three diseases. A routine second dose of MMR vaccine should be obtained at least 28 days after the first dose for students attending postsecondary schools, health care workers, or international travelers. People who received inactivated measles vaccine or an unknown type of measles vaccine during 1963-1967 should receive 2 doses of MMR vaccine. People who received inactivated mumps vaccine or an unknown type of mumps vaccine before 1979 and are at high risk for mumps infection should consider immunization with 2 doses of MMR vaccine. For females of childbearing age, rubella immunity should be determined. If there is no evidence of immunity, females who are not pregnant should be vaccinated. If there is no evidence of immunity, females who are pregnant should delay immunization until after pregnancy. Unvaccinated health care workers born before [birth year] who lack laboratory evidence of measles, mumps, or rubella immunity or laboratory confirmation of disease should consider measles and mumps immunization with 2 doses of MMR vaccine or rubella immunization with 1 dose of MMR vaccine.  Pneumococcal 13-valent conjugate (PCV13) vaccine. When indicated, a person who is uncertain of her immunization history and has no record of immunization should receive the  PCV13 vaccine. An adult aged 9 years or older who has certain medical conditions and has not been previously immunized should receive 1 dose of PCV13 vaccine. This PCV13 should be followed with a dose of pneumococcal polysaccharide (PPSV23) vaccine. The PPSV23 vaccine dose should be obtained at least 8 weeks after the dose of PCV13 vaccine. An adult aged 27 years or older who has certain medical conditions and previously received 1 or more doses of PPSV23 vaccine should receive 1 dose of PCV13. The PCV13 vaccine dose should be obtained 1 or more years after the last PPSV23 vaccine dose.  Pneumococcal polysaccharide (PPSV23) vaccine. When PCV13 is also indicated, PCV13 should be obtained first. All adults aged 16 years and older should be immunized. An adult younger than age 43 years who has certain medical conditions should be immunized. Any person who resides in a nursing home or long-term care facility should be immunized. An adult smoker should be immunized. People with an immunocompromised condition and certain other conditions should receive both PCV13 and PPSV23 vaccines. People with human immunodeficiency virus (HIV) infection should be immunized as soon as possible after diagnosis. Immunization during chemotherapy or radiation therapy should be avoided. Routine use of PPSV23 vaccine is not recommended for American Indians, [place] Natives, or people younger than 65 years unless there are medical conditions that require PPSV23 vaccine. When indicated, people who have unknown immunization and have no record of immunization should receive PPSV23 vaccine. One-time revaccination 5 years after the first dose of PPSV23 is recommended for people aged 19-64 years who have chronic kidney failure, nephrotic syndrome, asplenia, or immunocompromised conditions. People who received 1-2 doses of PPSV23 before age 5 years should receive another dose of PPSV23 vaccine at age 76 years or later if at least 5 years have  passed since the previous dose. Doses of PPSV23 are not needed  for people immunized with PPSV23 at or after age 22 years.  Meningococcal vaccine. Adults with asplenia or persistent complement component deficiencies should receive 2 doses of quadrivalent meningococcal conjugate (MenACWY-D) vaccine. The doses should be obtained at least 2 months apart. Microbiologists working with certain meningococcal bacteria, [place] recruits, people at risk during an outbreak, and people who travel to or live in countries with a high rate of meningitis should be immunized. A first-year college student up through age 61 years who is living in a residence hall should receive a dose if she did not receive a dose on or after her 16th birthday. Adults who have certain high-risk conditions should receive one or more doses of vaccine.  Hepatitis A vaccine. Adults who wish to be protected from this disease, have certain high-risk conditions, work with hepatitis A-infected animals, work in hepatitis A research labs, or travel to or work in countries with a high rate of hepatitis A should be immunized. Adults who were previously unvaccinated and who anticipate close contact with an international adoptee during the first 60 days after arrival in the [place] from a country with a high rate of hepatitis A should be immunized.  Hepatitis B vaccine. Adults who wish to be protected from this disease, have certain high-risk conditions, may be exposed to blood or other infectious body fluids, are household contacts or sex partners of hepatitis B positive people, are clients or workers in certain care facilities, or travel to or work in countries with a high rate of hepatitis B should be immunized.

## 2013-11-15 NOTE — Progress Notes (Signed)
Subjective:    Lori Soto is a 78 y.o. female who presents for Medicare Initial Wellness Visit  Preventive Screening-Counseling & Management  Tobacco History  Smoking status  . Never Smoker   Smokeless tobacco  . Never Used    Comment: Never smoker    Current Problems (verified) Patient Active Problem List   Diagnosis Date Noted  . Chronic LBP 05/14/2013  . IBS (irritable bowel syndrome) 11/08/2012  . UC (ulcerative colitis) 04/23/2012  . Palpitation 10/26/2011  . Hypertension 10/26/2011  . Chronic ulcerative colitis 03/23/2011    Medications Prior to Visit Current Outpatient Prescriptions on File Prior to Visit  Medication Sig Dispense Refill  . aspirin 81 MG tablet Take 81 mg by mouth daily.        . Calcium Carbonate-Vit D-Min (CALCIUM 1200 PO) Take by mouth 2 (two) times daily.        [name] 500 MG CAPS Take by mouth.      . Coenzyme Q10 (CO Q-10 PO) Take by mouth daily. With red yeast rice Pur[employer] Brand      . COLLAGEN PO Take by mouth 3 (three) times daily. This is known as 1-2-3      . Cyanocobalamin (VITAMIN B 12 PO) Take 1,000 mcg by mouth daily.       [name] estradiol (ESTRACE) 1 MG tablet Take 1 mg by mouth every other day.       . Ginkgo Biloba 40 MG TABS Take 120 mg by mouth daily.        [name] glucosamine-chondroitin 500-400 MG tablet Take 1 tablet by mouth 2 (two) times daily.        . Iron-Folic Acid-Vit T14 (IRON FORMULA PO) Take by mouth. Patient states that she takes this 3 times per week.       . Mesalamine (ASACOL HD) 800 MG TBEC Take 2 tablets (1,600 mg total) by mouth 2 (two) times daily.  360 tablet  3  . Misc Natural Products (BUTCHERS BROOM PO) Take by mouth daily.        . MULTIPLE VITAMINS PO Take by mouth daily.        . Nutritional Supplements (GRAPESEED EXTRACT PO) Take by mouth. 60 mg 1 tab a day      . OVER THE COUNTER MEDICATION daily. Vision Gold Luten 66 mg & Billberry 20 mg      . POTASSIUM GLUCONATE PO Take 99 mg by mouth daily.  This is Chelated Potassium -Puritans Pride      . pyridOXINE (VITAMIN B-6) 100 MG tablet Take 100 mg by mouth daily.       . Vitamins C E (CRANBERRY CONCENTRATE PO) Take 2,500 mg by mouth daily.       . Zinc 50 MG CAPS Take 25 mg by mouth daily.        No current facility-administered medications on file prior to visit.    Current Medications (verified) Current Outpatient Prescriptions  Medication Sig Dispense Refill  . aspirin 81 MG tablet Take 81 mg by mouth daily.        . Calcium Carbonate-Vit D-Min (CALCIUM 1200 PO) Take by mouth 2 (two) times daily.        [name] 500 MG CAPS Take by mouth.      . Coenzyme Q10 (CO Q-10 PO) Take by mouth daily. With red yeast rice [employer] Brand      . COLLAGEN PO Take by mouth 3 (three) times daily. This is  known as 1-2-3      . Cyanocobalamin (VITAMIN B 12 PO) Take 1,000 mcg by mouth daily.       [name] estradiol (ESTRACE) 1 MG tablet Take 1 mg by mouth every other day.       . Ginkgo Biloba 40 MG TABS Take 120 mg by mouth daily.        [name] glucosamine-chondroitin 500-400 MG tablet Take 1 tablet by mouth 2 (two) times daily.        . Iron-Folic Acid-Vit M42 (IRON FORMULA PO) Take by mouth. Patient states that she takes this 3 times per week.       . Mesalamine (ASACOL HD) 800 MG TBEC Take 2 tablets (1,600 mg total) by mouth 2 (two) times daily.  360 tablet  3  . Misc Natural Products (BUTCHERS BROOM PO) Take by mouth daily.        . MULTIPLE VITAMINS PO Take by mouth daily.        . Nutritional Supplements (GRAPESEED EXTRACT PO) Take by mouth. 60 mg 1 tab a day      . OVER THE COUNTER MEDICATION daily. Vision Gold Luten 66 mg & Billberry 20 mg      . POTASSIUM GLUCONATE PO Take 99 mg by mouth daily. This is Chelated Potassium -Pur[employer] Pride      . pyridOXINE (VITAMIN B-6) 100 MG tablet Take 100 mg by mouth daily.       . Vitamins C E (CRANBERRY CONCENTRATE PO) Take 2,500 mg by mouth daily.       . Zinc 50 MG CAPS Take 25 mg by mouth daily.         No current facility-administered medications for this visit.     Allergies (verified) Influenza virus vacc split pf and Penicillins   PAST HISTORY  Family History Family History  Problem Relation Age of Onset  . Arthritis Mother   . Prostate cancer Father   . Liver cancer Brother   . Lung cancer Brother   . Heart disease Sister     Rheumatic Fever  . Diabetes Sister   . Colon cancer Sister   . GI problems Sister     diverticulitis  . Diabetes Sister   . Neuropathy Sister   . COPD Sister     Social History History  Substance Use Topics  . Smoking status: Never Smoker   . Smokeless tobacco: Never Used     Comment: Never smoker  . Alcohol Use: Yes     Comment: Very Rarely     Are there smokers in your home (other than you)? No  Risk Factors Current exercise habits: Gym/ health club routine includes light weights, treadmill, walking on track  and group exercises.  Dietary issues discussed: none   Cardiac risk factors: advanced age (older than 63 for men, 38 for women).  Depression Screen (Note: if answer to either of the following is "Yes", a more complete depression screening is indicated)   Over the past 2 weeks, have you felt down, depressed or hopeless? No  Over the past 2 weeks, have you felt little interest or pleasure in doing things? No  Have you lost interest or pleasure in daily life? No  Do you often feel hopeless? No  Do you cry easily over simple problems? No  Activities of Daily Living In your present state of health, do you have any difficulty performing the following activities?:  Driving? No Managing money?  No Feeding yourself? No Getting  from bed to chair? No  Climbing a flight of stairs? No Preparing food and eating?: No Bathing or showering? No Getting dressed: No Getting to the toilet? No Using the toilet:No Moving around from place to place: No In the past year have you fallen or had a near fall?:No   Are you sexually active?   Yes  Do you have more than one partner?  No  Hearing Difficulties: No Do you often ask people to speak up or repeat themselves? No Do you experience ringing or noises in your ears? No Do you have difficulty understanding soft or whispered voices? No   Do you feel that you have a problem with memory? No  Do you often misplace items? No  Do you feel safe at home?  Yes  Cognitive Testing  Alert? Yes  Normal Appearance?Yes  Oriented to person? Yes  Place? Yes   Time? Yes  Recall of three objects?  Yes  Can perform simple calculations? Yes  Displays appropriate judgment?Yes  Can read the correct time from a watch face?Yes   Advanced Directives have been discussed with the patient? Yes - patient has discussed with her daughter but she does not have written down and doesn't wish to have written instructions  List the Names of Other Physician/Practitioners you currently use: 1.  Dermatologist - Dr Allyson Sabal 2.  GI - Dr Laural Golden 3.  Cardiology - Dr Jenkins Rouge 4.  Podiatrist - Dr. Blanch Media 5.  GYN - Dr. Adah Perl  There is no immunization history on file for this patient.  Screening Tests Health Maintenance  Topic Date Due  . Pneumococcal Polysaccharide Vaccine Age 41 And Over  01/14/2014 (Originally 12/14/2002)  . Zostavax  01/14/2014 (Originally 12/13/1997)  . Tetanus/tdap  01/14/2014 (Originally 12/13/1956)  . Influenza Vaccine  11/30/2013  . Mammogram  10/03/2014  . Colonoscopy  05/13/2020    All answers were reviewed with the patient and necessary referrals were made:  Cherre Robins, [facility]   11/15/2013   History reviewed: allergies, current medications, past family history, past medical history, past social history, past surgical history and problem list  Objective:     Body mass index is 24.45 kg/(m^2). BP 140/78  Pulse 70  Ht 5' 3"  (1.6 m)  Wt 138 lb (62.596 kg)  BMI 24.45 kg/m2   Assessment:     Initial Annual Wellness Visit     Plan:     During the course of the  visit the patient was educated and counseled about appropriate screening and preventive services including:    Pneumococcal vaccine - due Prevnar 13 - patient given information  Influenza vaccine - patietn doent get due to history of reaction  Hepatitis B vaccine  Td vaccine - given information and also about Zostavax  Screening electrocardiogram  Screening mammography - up to date  Screening Pap smear and pelvic exam   Bone densitometry screening -order sent du 11/2013  Colorectal cancer screening  Diabetes screening  Glaucoma screening - done 04/2014 with Dr Radford Pax in [place]  Advanced directives: has NO advanced directive - not interested in additional information  Diet review for nutrition referral? Yes ____  Not Indicated __X__   Patient Instructions (the written plan) was given to the patient.  Medicare Attestation I have personally reviewed: The patient's medical and social history Their use of alcohol, tobacco or illicit drugs Their current medications and supplements The patient's functional ability including ADLs,fall risks, home safety risks, cognitive, and hearing and visual impairment Diet and physical  activities Evidence for depression or mood disorders  The patient's weight, height, BMI, and HR/BP have been recorded in the chart.  I have made referrals, counseling, and provided education to the patient based on review of the above and I have provided the patient with a written personalized care plan for preventive services.     Cherre Robins, [facility]   11/15/2013

## 2013-11-28 ENCOUNTER — Telehealth: Payer: Self-pay | Admitting: Pharmacist

## 2013-11-28 DIAGNOSIS — K51919 Ulcerative colitis, unspecified with unspecified complications: Secondary | ICD-10-CM

## 2013-11-28 DIAGNOSIS — I10 Essential (primary) hypertension: Secondary | ICD-10-CM

## 2013-11-28 DIAGNOSIS — Z0000 Encounter for general adult medical examination without abnormal findings: Secondary | ICD-10-CM

## 2013-11-28 NOTE — Telephone Encounter (Signed)
Orders placed.

## 2013-11-29 ENCOUNTER — Other Ambulatory Visit (INDEPENDENT_AMBULATORY_CARE_PROVIDER_SITE_OTHER): Payer: Medicare Other

## 2013-11-29 DIAGNOSIS — E559 Vitamin D deficiency, unspecified: Secondary | ICD-10-CM

## 2013-11-29 DIAGNOSIS — R5381 Other malaise: Secondary | ICD-10-CM

## 2013-11-29 DIAGNOSIS — E785 Hyperlipidemia, unspecified: Secondary | ICD-10-CM

## 2013-11-29 DIAGNOSIS — Z09 Encounter for follow-up examination after completed treatment for conditions other than malignant neoplasm: Secondary | ICD-10-CM

## 2013-11-29 DIAGNOSIS — R5383 Other fatigue: Secondary | ICD-10-CM

## 2013-11-29 LAB — POCT CBC
GRANULOCYTE PERCENT: 63.6 % (ref 37–80)
HEMATOCRIT: 41.5 % (ref 37.7–47.9)
HEMOGLOBIN: 13.7 g/dL (ref 12.2–16.2)
Lymph, poc: 1.8 (ref 0.6–3.4)
MCH, POC: 29 pg (ref 27–31.2)
MCHC: 32.9 g/dL (ref 31.8–35.4)
MCV: 88.3 fL (ref 80–97)
MPV: 8.8 fL (ref 0–99.8)
POC GRANULOCYTE: 3.6 (ref 2–6.9)
POC LYMPH PERCENT: 32.8 %L (ref 10–50)
Platelet Count, POC: 277 10*3/uL (ref 142–424)
RBC: 4.7 M/uL (ref 4.04–5.48)
RDW, POC: 13.1 %
WBC: 5.6 10*3/uL (ref 4.6–10.2)

## 2013-11-30 LAB — CMP14+EGFR
A/G RATIO: 1.6 (ref 1.1–2.5)
ALBUMIN: 4.2 g/dL (ref 3.5–4.8)
ALT: 14 IU/L (ref 0–32)
AST: 18 IU/L (ref 0–40)
Alkaline Phosphatase: 35 IU/L — ABNORMAL LOW (ref 39–117)
BILIRUBIN TOTAL: 0.5 mg/dL (ref 0.0–1.2)
BUN / CREAT RATIO: 12 (ref 11–26)
BUN: 9 mg/dL (ref 8–27)
CO2: 28 mmol/L (ref 18–29)
CREATININE: 0.77 mg/dL (ref 0.57–1.00)
Calcium: 9.8 mg/dL (ref 8.7–10.3)
Chloride: 101 mmol/L (ref 97–108)
GFR calc Af Amer: 87 mL/min/{1.73_m2} (ref >59)
GFR calc non Af Amer: 76 mL/min/{1.73_m2} (ref >59)
Globulin, Total: 2.7 g/dL (ref 1.5–4.5)
Glucose: 82 mg/dL (ref 65–99)
Potassium: 4.6 mmol/L (ref 3.5–5.2)
Sodium: 143 mmol/L (ref 134–144)
Total Protein: 6.9 g/dL (ref 6.0–8.5)

## 2013-11-30 LAB — NMR, LIPOPROFILE
Cholesterol: 189 mg/dL (ref 100–199)
HDL Cholesterol by NMR: 69 mg/dL (ref >39)
HDL Particle Number: 38.7 umol/L (ref >=30.5)
LDL PARTICLE NUMBER: 1101 nmol/L — AB (ref <1000)
LDL SIZE: 21.7 nm (ref >20.5)
LDLC SERPL CALC-MCNC: 109 mg/dL — ABNORMAL HIGH (ref 0–99)
LP-IR Score: <25 (ref <=45)
SMALL LDL PARTICLE NUMBER: 153 nmol/L (ref <=527)
Triglycerides by NMR: 54 mg/dL (ref 0–149)

## 2013-11-30 LAB — THYROID PANEL WITH TSH
Free Thyroxine Index: 2.1 (ref 1.2–4.9)
T3 UPTAKE RATIO: 27 % (ref 24–39)
T4 TOTAL: 7.7 ug/dL (ref 4.5–12.0)
TSH: 2.25 u[IU]/mL (ref 0.450–4.500)

## 2013-11-30 LAB — VITAMIN D 25 HYDROXY (VIT D DEFICIENCY, FRACTURES): Vit D, 25-Hydroxy: 57 ng/mL (ref 30.0–100.0)

## 2013-12-03 ENCOUNTER — Ambulatory Visit (INDEPENDENT_AMBULATORY_CARE_PROVIDER_SITE_OTHER): Payer: Medicare Other

## 2013-12-03 ENCOUNTER — Encounter: Payer: Self-pay | Admitting: Family Medicine

## 2013-12-03 ENCOUNTER — Ambulatory Visit (INDEPENDENT_AMBULATORY_CARE_PROVIDER_SITE_OTHER): Payer: Medicare Other | Admitting: Family Medicine

## 2013-12-03 VITALS — BP 130/79 | HR 67 | Temp 97.3°F | Ht 63.0 in | Wt 135.0 lb

## 2013-12-03 DIAGNOSIS — M51379 Other intervertebral disc degeneration, lumbosacral region without mention of lumbar back pain or lower extremity pain: Secondary | ICD-10-CM

## 2013-12-03 DIAGNOSIS — I10 Essential (primary) hypertension: Secondary | ICD-10-CM | POA: Diagnosis not present

## 2013-12-03 DIAGNOSIS — M25569 Pain in unspecified knee: Secondary | ICD-10-CM

## 2013-12-03 DIAGNOSIS — M25561 Pain in right knee: Secondary | ICD-10-CM

## 2013-12-03 DIAGNOSIS — M5137 Other intervertebral disc degeneration, lumbosacral region: Secondary | ICD-10-CM

## 2013-12-03 DIAGNOSIS — M4120 Other idiopathic scoliosis, site unspecified: Secondary | ICD-10-CM

## 2013-12-03 DIAGNOSIS — M419 Scoliosis, unspecified: Secondary | ICD-10-CM | POA: Insufficient documentation

## 2013-12-03 DIAGNOSIS — M5136 Other intervertebral disc degeneration, lumbar region: Secondary | ICD-10-CM

## 2013-12-03 NOTE — Progress Notes (Signed)
Subjective:    Patient ID: Lori Soto, female    DOB: April 13, 1936, 78 y.o.   MRN: [id number]  HPI Pt here for follow up and management of chronic medical problems.         Patient Active Problem List   Diagnosis Date Noted  . Chronic LBP 05/14/2013  . IBS (irritable bowel syndrome) 11/08/2012  . UC (ulcerative colitis) 04/23/2012  . Palpitation 10/26/2011  . Hypertension 10/26/2011  . Chronic ulcerative colitis 03/23/2011   Outpatient Encounter Prescriptions as of 12/03/2013  Medication Sig  . aspirin 81 MG tablet Take 81 mg by mouth daily.    . Calcium Carbonate-Vit D-Min (CALCIUM 1200 PO) Take by mouth 2 (two) times daily.    [name] 500 MG CAPS Take by mouth.  . Coenzyme Q10 (CO Q-10 PO) Take by mouth daily. With red yeast rice [employer] Brand  . COLLAGEN PO Take by mouth 3 (three) times daily. This is known as 1-2-3  . Cyanocobalamin (VITAMIN B 12 PO) Take 1,000 mcg by mouth daily.   [name] estradiol (ESTRACE) 1 MG tablet Take 1 mg by mouth every other day.   . Ginkgo Biloba 40 MG TABS Take 120 mg by mouth daily.    [name] glucosamine-chondroitin 500-400 MG tablet Take 1 tablet by mouth 2 (two) times daily.    . Iron-Folic Acid-Vit N47 (IRON FORMULA PO) Take by mouth. Patient states that she takes this 3 times per week.   . Mesalamine (ASACOL HD) 800 MG TBEC Take 2 tablets (1,600 mg total) by mouth 2 (two) times daily.  . Misc Natural Products (BUTCHERS BROOM PO) Take by mouth daily.    . MULTIPLE VITAMINS PO Take by mouth daily.    . Nutritional Supplements (GRAPESEED EXTRACT PO) Take by mouth. 60 mg 1 tab a day  . OVER THE COUNTER MEDICATION daily. Vision Gold Luten 66 mg & Billberry 20 mg  . POTASSIUM GLUCONATE PO Take 99 mg by mouth daily. This is Chelated Potassium -[employer] Pride  . pyridOXINE (VITAMIN B-6) 100 MG tablet Take 100 mg by mouth daily.   . Vitamins C E (CRANBERRY CONCENTRATE PO) Take 2,500 mg by mouth daily.   . Zinc 50 MG CAPS Take 25 mg by mouth  daily.     Review of Systems  Constitutional: Negative.   HENT: Negative.   Eyes: Negative.   Respiratory: Negative.   Cardiovascular: Negative.   Gastrointestinal: Negative.   Endocrine: Negative.   Genitourinary: Negative.   Musculoskeletal: Positive for arthralgias (right knee pain ).       Hand numbness- in the am  Skin: Negative.   Allergic/Immunologic: Negative.   Neurological: Negative.   Hematological: Negative.   Psychiatric/Behavioral: Negative.        Objective:   Physical Exam  Nursing note and vitals reviewed. Constitutional: She is oriented to person, place, and time. She appears well-developed and well-nourished. No distress.  HENT:  Head: Normocephalic and atraumatic.  Right Ear: External ear normal.  Left Ear: External ear normal.  Nose: Nose normal.  Mouth/Throat: Oropharynx is clear and moist.  Eyes: Conjunctivae and EOM are normal. Pupils are equal, round, and reactive to light. Right eye exhibits no discharge. Left eye exhibits no discharge. No scleral icterus.  Neck: Normal range of motion. Neck supple. No JVD present. No thyromegaly present.  No carotid bruits auscultated  Cardiovascular: Normal rate, regular rhythm, normal heart sounds and intact distal pulses.  Exam reveals no gallop and no friction  rub.   No murmur heard. At 72 per minute  Pulmonary/Chest: Effort normal and breath sounds normal. No respiratory distress. She has no wheezes. She has no rales. She exhibits no tenderness.  Abdominal: Soft. Bowel sounds are normal. She exhibits no mass. There is no tenderness. There is no rebound and no guarding.  Musculoskeletal: Normal range of motion. She exhibits no edema and no tenderness.  There was some swelling and stiffness in the right knee especially H. joint line. The right knee was bigger than the left. And the patient indicates problems with walking with the right knee.  Lymphadenopathy:    She has no cervical adenopathy.  Neurological:  She is alert and oriented to person, place, and time. She has normal reflexes. No cranial nerve deficit.  Skin: Skin is warm and dry. No rash noted.  Psychiatric: She has a normal mood and affect. Her behavior is normal. Judgment and thought content normal.   BP 130/79  Pulse 67  Temp(Src) 97.3 F (36.3 C) (Oral)  Ht 5' 3"  (1.6 m)  Wt 135 lb (61.236 kg)  BMI 23.92 kg/m2  [facility] reading (PRIMARY) by  Dr. Brunilda Payor x-ray, right knee --- degenerative changes right knee and thoracic spine. Scoliosis thoracic spine. enlarged heart                                       Assessment & Plan:  1. Essential hypertension - DG Bone Density; Future - DG Chest 2 View; Future  2. DDD (degenerative disc disease), lumbar  3. Scoliosis  4. Right knee pain - DG Knee 1-2 Views Right; Future  No orders of the defined types were placed in this encounter.   Patient Instructions                       Medicare Annual Wellness Visit  [facility] and the medical providers at [facility] strive to bring you the best medical care.  In doing so we not only want to address your current medical conditions and concerns but also to detect new conditions early and prevent illness, disease and health-related problems.    Medicare offers a yearly Wellness Visit which allows our clinical staff to assess your need for preventative services including immunizations, lifestyle education, counseling to decrease risk of preventable diseases and screening for fall risk and other medical concerns.    This visit is provided free of charge (no copay) for all Medicare recipients. The clinical pharmacists at [facility] have begun to conduct these Wellness Visits which will also include a thorough review of all your medications.    As you primary medical provider recommend that you make an appointment for your Annual Wellness Visit if you have not done so already this year.  You  may set up this appointment before you leave today or you may call back ([phone]) and schedule an appointment.  Please make sure when you call that you mention that you are scheduling your Annual Wellness Visit with the clinical pharmacist so that the appointment may be made for the proper length of time.     Continue current medications. Continue good therapeutic lifestyle changes which include good diet and exercise. Fall precautions discussed with patient. If an FOBT was given today- please return it to our front desk. If you are over 42 years old - you may need  Prevnar 13 or the adult Pneumonia vaccine.  Continue ibuprofen Use warm wet compresses to right knee Be careful and to not put herself at risk for falling We will arrange an appointment for you with the orthopedist for a possible injection in the knee    Arrie Senate MD

## 2013-12-03 NOTE — Patient Instructions (Addendum)
Medicare Annual Wellness Visit  [facility] and the medical providers at [facility] strive to bring you the best medical care.  In doing so we not only want to address your current medical conditions and concerns but also to detect new conditions early and prevent illness, disease and health-related problems.    Medicare offers a yearly Wellness Visit which allows our clinical staff to assess your need for preventative services including immunizations, lifestyle education, counseling to decrease risk of preventable diseases and screening for fall risk and other medical concerns.    This visit is provided free of charge (no copay) for all Medicare recipients. The clinical pharmacists at [facility] have begun to conduct these Wellness Visits which will also include a thorough review of all your medications.    As you primary medical provider recommend that you make an appointment for your Annual Wellness Visit if you have not done so already this year.  You may set up this appointment before you leave today or you may call back ([phone]) and schedule an appointment.  Please make sure when you call that you mention that you are scheduling your Annual Wellness Visit with the clinical pharmacist so that the appointment may be made for the proper length of time.     Continue current medications. Continue good therapeutic lifestyle changes which include good diet and exercise. Fall precautions discussed with patient. If an FOBT was given today- please return it to our front desk. If you are over 35 years old - you may need Prevnar 14 or the adult Pneumonia vaccine.  Continue ibuprofen Use warm wet compresses to right knee Be careful and to not put herself at risk for falling We will arrange an appointment for you with the orthopedist for a possible injection in the knee

## 2013-12-04 ENCOUNTER — Telehealth: Payer: Self-pay

## 2013-12-04 NOTE — Telephone Encounter (Signed)
Pt aware of knee and CXR results

## 2013-12-04 NOTE — Telephone Encounter (Signed)
Message copied by Koren Bound on Wed Dec 04, 2013  8:31 AM ------      Message from: Chipper Herb      Created: Tue Dec 03, 2013  5:03 PM       As per radiology report ------

## 2013-12-05 ENCOUNTER — Other Ambulatory Visit: Payer: Medicare Other

## 2013-12-05 DIAGNOSIS — Z1212 Encounter for screening for malignant neoplasm of rectum: Secondary | ICD-10-CM | POA: Diagnosis not present

## 2013-12-05 DIAGNOSIS — M1710 Unilateral primary osteoarthritis, unspecified knee: Secondary | ICD-10-CM | POA: Diagnosis not present

## 2013-12-06 DIAGNOSIS — Z01419 Encounter for gynecological examination (general) (routine) without abnormal findings: Secondary | ICD-10-CM | POA: Diagnosis not present

## 2013-12-06 DIAGNOSIS — R87622 Low grade squamous intraepithelial lesion on cytologic smear of vagina (LGSIL): Secondary | ICD-10-CM | POA: Diagnosis not present

## 2013-12-06 DIAGNOSIS — Z1272 Encounter for screening for malignant neoplasm of vagina: Secondary | ICD-10-CM | POA: Diagnosis not present

## 2013-12-06 LAB — FECAL OCCULT BLOOD, IMMUNOCHEMICAL: Fecal Occult Bld: POSITIVE — AB

## 2013-12-10 ENCOUNTER — Telehealth: Payer: Self-pay | Admitting: Family Medicine

## 2013-12-10 NOTE — Telephone Encounter (Signed)
Message copied by Waverly Ferrari on Tue Dec 10, 2013  9:47 AM ------      Message from: Chipper Herb      Created: Fri Dec 06, 2013  8:09 PM       The FOBT was positive. Please wait 2 weeks and repeat FOBT and CBC ------

## 2013-12-10 NOTE — Telephone Encounter (Signed)
Pt aware of lab results.  Appt made to do CBC & repeat FOBT for 12/23/13

## 2013-12-23 ENCOUNTER — Other Ambulatory Visit (INDEPENDENT_AMBULATORY_CARE_PROVIDER_SITE_OTHER): Payer: Medicare Other

## 2013-12-23 DIAGNOSIS — K921 Melena: Secondary | ICD-10-CM

## 2013-12-23 LAB — POCT CBC
Granulocyte percent: 64.8 %G (ref 37–80)
HEMATOCRIT: 42.3 % (ref 37.7–47.9)
Hemoglobin: 13.6 g/dL (ref 12.2–16.2)
Lymph, poc: 1.8 (ref 0.6–3.4)
MCH, POC: 28.4 pg (ref 27–31.2)
MCHC: 32.2 g/dL (ref 31.8–35.4)
MCV: 88.3 fL (ref 80–97)
MPV: 8.1 fL (ref 0–99.8)
POC Granulocyte: 3.8 (ref 2–6.9)
POC LYMPH PERCENT: 31 %L (ref 10–50)
Platelet Count, POC: 254 10*3/uL (ref 142–424)
RBC: 4.8 M/uL (ref 4.04–5.48)
RDW, POC: 13.4 %
WBC: 5.9 10*3/uL (ref 4.6–10.2)

## 2013-12-23 NOTE — Progress Notes (Signed)
Patient came in for labs only.

## 2013-12-24 ENCOUNTER — Telehealth: Payer: Self-pay | Admitting: *Deleted

## 2013-12-24 DIAGNOSIS — R87622 Low grade squamous intraepithelial lesion on cytologic smear of vagina (LGSIL): Secondary | ICD-10-CM | POA: Diagnosis not present

## 2013-12-24 NOTE — Telephone Encounter (Signed)
Pt aware of lab results.  rs

## 2013-12-24 NOTE — Telephone Encounter (Signed)
Message copied by Priscille Heidelberg on Tue Dec 24, 2013 11:40 AM ------      Message from: Chipper Herb      Created: Mon Dec 23, 2013 10:44 AM       The CBC has a normal white blood cell count. Hemoglobin was good at 13.6. This is stable and consistent with past readings. The platelet count is adequate ------

## 2013-12-26 ENCOUNTER — Telehealth: Payer: Self-pay | Admitting: Family Medicine

## 2013-12-26 NOTE — Telephone Encounter (Signed)
Message copied by Cline Crock on Thu Dec 26, 2013 11:15 AM ------      Message from: Chipper Herb      Created: Mon Dec 23, 2013 10:44 AM       The CBC has a normal white blood cell count. Hemoglobin was good at 13.6. This is stable and consistent with past readings. The platelet count is adequate ------

## 2013-12-26 NOTE — Telephone Encounter (Signed)
Pt aware of lab results.  rs

## 2014-01-10 ENCOUNTER — Other Ambulatory Visit: Payer: Medicare Other

## 2014-01-10 DIAGNOSIS — Z1212 Encounter for screening for malignant neoplasm of rectum: Secondary | ICD-10-CM | POA: Diagnosis not present

## 2014-01-10 NOTE — Progress Notes (Signed)
Lab only 

## 2014-01-12 LAB — FECAL OCCULT BLOOD, IMMUNOCHEMICAL: Fecal Occult Bld: POSITIVE — AB

## 2014-01-20 ENCOUNTER — Telehealth: Payer: Self-pay | Admitting: *Deleted

## 2014-01-20 NOTE — Telephone Encounter (Signed)
Patient notified of lab results. She will come in at the end of the week for a repeat CBC

## 2014-01-20 NOTE — Telephone Encounter (Signed)
Message copied by Rolena Infante on Mon Jan 20, 2014  9:47 AM ------      Message from: Lori Soto      Created: Mon Jan 13, 2014  7:13 AM       FOBT remains positive and this is after one month, her next colonoscopy is not due until 2022-----please repeat CBC at patient's convenience ------

## 2014-02-19 ENCOUNTER — Ambulatory Visit (INDEPENDENT_AMBULATORY_CARE_PROVIDER_SITE_OTHER): Payer: Medicare Other | Admitting: Pharmacist

## 2014-02-19 ENCOUNTER — Ambulatory Visit (INDEPENDENT_AMBULATORY_CARE_PROVIDER_SITE_OTHER): Payer: Medicare Other

## 2014-02-19 ENCOUNTER — Encounter: Payer: Self-pay | Admitting: Pharmacist

## 2014-02-19 VITALS — Ht 61.0 in | Wt 138.0 lb

## 2014-02-19 DIAGNOSIS — I10 Essential (primary) hypertension: Secondary | ICD-10-CM

## 2014-02-19 DIAGNOSIS — M899 Disorder of bone, unspecified: Secondary | ICD-10-CM

## 2014-02-19 DIAGNOSIS — M8580 Other specified disorders of bone density and structure, unspecified site: Secondary | ICD-10-CM

## 2014-02-19 LAB — POCT CBC
Granulocyte percent: 69.6 %G (ref 37–80)
HCT, POC: 41.1 % (ref 37.7–47.9)
Hemoglobin: 13.3 g/dL (ref 12.2–16.2)
Lymph, poc: 1.6 (ref 0.6–3.4)
MCH, POC: 28.5 pg (ref 27–31.2)
MCHC: 32.3 g/dL (ref 31.8–35.4)
MCV: 88.1 fL (ref 80–97)
MPV: 8.3 fL (ref 0–99.8)
PLATELET COUNT, POC: 292 10*3/uL (ref 142–424)
POC GRANULOCYTE: 4.1 (ref 2–6.9)
POC LYMPH %: 27.1 % (ref 10–50)
RBC: 4.7 M/uL (ref 4.04–5.48)
RDW, POC: 13.8 %
WBC: 5.9 10*3/uL (ref 4.6–10.2)

## 2014-02-19 LAB — HM DEXA SCAN

## 2014-02-19 MED ORDER — CALCIUM CARBONATE-VITAMIN D 600-400 MG-UNIT PO TABS: 1.0000 | ORAL_TABLET | Freq: Every day | ORAL | Status: DC

## 2014-02-19 NOTE — Progress Notes (Signed)
Patient ID: Lori Soto, female   DOB: 1936-02-02, 78 y.o.   MRN: [id number]  [facility] Current Height: Height: 5' 1"  (154.9 cm)      Max Lifetime Height:  5' 3"  Current Weight: Weight: 138 lb (62.596 kg)       Ethnicity:Caucasian    HPI: Patient here today to review DEXA results.  Previous DEXA from 12/14/2011 was normal  Back Pain?  Yes       Kyphosis?  No Prior fracture?  No Med(s) for Osteoporosis/Osteopenia:  none Med(s) previously tried for Osteoporosis/Osteopenia:  none                                                             PMH: Age at menopause:  51's Hysterectomy?  Yes Oophorectomy?  No HRT? No Steroid Use?  No Thyroid med?  No History of cancer?  No History of digestive disorders (ie Crohn's)?  Yes - colitis Current or previous eating disorders?  No Last Vitamin D Result:  57 (11/30/2013) Last GFR Result:  76 (11/30/2013)   FH/SH: Family history of osteoporosis?  No Parent with history of hip fracture?  No Family history of breast cancer?  No Exercise?  Yes   - goes to gym 3 times per week Smoking?  No Alcohol?  No    Calcium Assessment Calcium Intake  # of servings/day  Calcium mg  Milk (8 oz) 0  x  300  = 0  Yogurt (4 oz) 0 x  200 = 0  Cheese (1 oz) 1 x  200 = 200  Other Calcium sources   258m  Ca supplement Calcium 6060mbid and MVI = 160068m Estimated calcium intake per day 2250m74m DEXA Results Date of Test T-Score for AP Spine L1-L4 T-Score for Total Left Hip T-Score for Total Right Hip  02/19/2014 4.6 0.7 0.7  12/13/2013 4.2 0.8 0.6            **T-score for neck of left hip = -1.1 today (02/19/2014)  FRAX 10 year estimate: Total FX risk:  11%  (consider medication if >/= 20%) Hip FX risk:  1.8%  (consider medication if >/= 3%)  Assessment: Osteopenia - low fracture risk per FRAX estimate  Recommendations: 1.  Reviewed DEXA results and fracture risk with patient 2.  recommend calcium 1200mg102mly through  supplementation or diet. Change calcium supplement to QD since also taking MVI. 3.  recommend weight bearing exercise - 30 minutes at least 4 days per week.   4.  Counseled and educated about fall risk and prevention.  Recheck DEXA:  2 years  Time spent counseling patient:  25 minutes  TammyCherre RobinsrmD, CPP

## 2014-02-19 NOTE — Patient Instructions (Signed)

## 2014-02-24 ENCOUNTER — Telehealth: Payer: Self-pay | Admitting: Family Medicine

## 2014-02-24 NOTE — Progress Notes (Signed)
Quick Note:  Copy of labs sent to patient ______ 

## 2014-02-24 NOTE — Telephone Encounter (Signed)
appt made with [facility]. Pt requested Wed.

## 2014-02-26 ENCOUNTER — Ambulatory Visit (INDEPENDENT_AMBULATORY_CARE_PROVIDER_SITE_OTHER): Payer: Medicare Other

## 2014-02-26 ENCOUNTER — Encounter: Payer: Self-pay | Admitting: Family Medicine

## 2014-02-26 ENCOUNTER — Ambulatory Visit (INDEPENDENT_AMBULATORY_CARE_PROVIDER_SITE_OTHER): Payer: Medicare Other | Admitting: Family Medicine

## 2014-02-26 VITALS — BP 134/86 | HR 76 | Temp 97.2°F | Ht 61.0 in | Wt 138.0 lb

## 2014-02-26 DIAGNOSIS — S86812A Strain of other muscle(s) and tendon(s) at lower leg level, left leg, initial encounter: Secondary | ICD-10-CM | POA: Diagnosis not present

## 2014-02-26 DIAGNOSIS — M25562 Pain in left knee: Secondary | ICD-10-CM

## 2014-02-26 DIAGNOSIS — Z23 Encounter for immunization: Secondary | ICD-10-CM

## 2014-02-26 DIAGNOSIS — M1712 Unilateral primary osteoarthritis, left knee: Secondary | ICD-10-CM

## 2014-02-26 DIAGNOSIS — S86912A Strain of unspecified muscle(s) and tendon(s) at lower leg level, left leg, initial encounter: Secondary | ICD-10-CM

## 2014-02-26 NOTE — Addendum Note (Signed)
Addended by: Zannie Cove on: 02/26/2014 03:52 PM   Modules accepted: Orders

## 2014-02-26 NOTE — Progress Notes (Signed)
Subjective:    Patient ID: Lori Soto, female    DOB: July 08, 1935, 78 y.o.   MRN: [id number]  HPI Patient here today for left knee pain. She recently has heard a "pop" and has new onset pain. She does have history of right knee pain as well.          Review of Systems  Constitutional: Negative.   HENT: Negative.   Eyes: Negative.   Respiratory: Negative.   Cardiovascular: Negative.   Gastrointestinal: Negative.   Endocrine: Negative.   Genitourinary: Negative.   Musculoskeletal: Positive for arthralgias (left knee pain).  Skin: Negative.   Allergic/Immunologic: Negative.   Neurological: Negative.   Hematological: Negative.   Psychiatric/Behavioral: Negative.    Patient Active Problem List   Diagnosis Date Noted  . Osteopenia of the elderly 02/19/2014  . DDD (degenerative disc disease), lumbar 12/03/2013  . Scoliosis 12/03/2013  . Chronic LBP 05/14/2013  . IBS (irritable bowel syndrome) 11/08/2012  . UC (ulcerative colitis) 04/23/2012  . Palpitation 10/26/2011  . Hypertension 10/26/2011  . Chronic ulcerative colitis 03/23/2011   Outpatient Encounter Prescriptions as of 02/26/2014  Medication Sig  . aspirin 81 MG tablet Take 81 mg by mouth daily.    . Calcium Carbonate-Vitamin D (CALCIUM 600+D) 600-400 MG-UNIT per tablet Take 1 tablet by mouth daily.  [name] 500 MG CAPS Take by mouth.  . Coenzyme Q10 (CO Q-10 PO) Take by mouth daily. With red yeast rice Pur[employer] Brand  . COLLAGEN PO Take by mouth 3 (three) times daily. This is known as 1-2-3  . Cyanocobalamin (VITAMIN B 12 PO) Take 1,000 mcg by mouth daily.   [name] estradiol (ESTRACE) 1 MG tablet Take 1 mg by mouth every other day.   . Ginkgo Biloba 40 MG TABS Take 120 mg by mouth daily.    [name] glucosamine-chondroitin 500-400 MG tablet Take 1 tablet by mouth 2 (two) times daily.    . Mesalamine (ASACOL HD) 800 MG TBEC Take 2 tablets (1,600 mg total) by mouth 2 (two) times daily.  . Misc Natural Products  (BUTCHERS BROOM PO) Take by mouth daily.    . MULTIPLE VITAMINS PO Take by mouth daily.    . Nutritional Supplements (GRAPESEED EXTRACT PO) Take by mouth. 60 mg 1 tab a day  . OVER THE COUNTER MEDICATION daily. Vision Gold Luten 66 mg & Billberry 20 mg  . POTASSIUM GLUCONATE PO Take 99 mg by mouth daily. This is Chelated Potassium -Puritans Pride  . pyridOXINE (VITAMIN B-6) 100 MG tablet Take 100 mg by mouth daily.   . Vitamins C E (CRANBERRY CONCENTRATE PO) Take 2,500 mg by mouth daily.   . Zinc 50 MG CAPS Take 25 mg by mouth daily.        Objective:   Physical Exam  Nursing note and vitals reviewed. Constitutional: She is oriented to person, place, and time. She appears well-developed and well-nourished. No distress.  HENT:  Head: Normocephalic.  Eyes: Conjunctivae and EOM are normal. Pupils are equal, round, and reactive to light. Right eye exhibits no discharge. Left eye exhibits no discharge. No scleral icterus.  Neck: Normal range of motion.  Musculoskeletal: Normal range of motion. She exhibits edema and tenderness.  There is swelling and rubor about the left knee. There is bilateral joint pain tenderness.  Neurological: She is alert and oriented to person, place, and time.  Skin: No rash noted.  Psychiatric: She has a normal mood and affect. Her behavior is normal.  Judgment and thought content normal.   BP 134/86  Pulse 76  Temp(Src) 97.2 F (36.2 C) (Oral)  Ht 5' 1"  (1.549 m)  Wt 138 lb (62.596 kg)  BMI 26.09 kg/m2  [facility] reading (PRIMARY) by  Dr.Breigh Annett-left knee--degenerative changes -and edema                                       Assessment & Plan:  1. Left knee pain - DG Knee 1-2 Views Left; Future  2. Knee strain, left, initial encounter  3. Primary osteoarthritis of left knee  Patient Instructions  Take ibuprofen twice daily after breakfast and supper Take Zantac 150 twice daily before breakfast and supper to protect stomach We will arrange for you to  have an appointment with Dr. Maureen Ralphs in [place] We will hopefully call you by tomorrow afternoon with the date of this appointment In the meantime use some warm wet compresses and take the above anti-inflammatory medicine as directed   Arrie Senate MD

## 2014-02-26 NOTE — Patient Instructions (Signed)
Take ibuprofen twice daily after breakfast and supper Take Zantac 150 twice daily before breakfast and supper to protect stomach We will arrange for you to have an appointment with Dr. Maureen Ralphs in [place] We will hopefully call you by tomorrow afternoon with the date of this appointment In the meantime use some warm wet compresses and take the above anti-inflammatory medicine as directed

## 2014-02-27 ENCOUNTER — Telehealth: Payer: Self-pay

## 2014-02-27 NOTE — Telephone Encounter (Signed)
Pt aware of knee results

## 2014-02-27 NOTE — Telephone Encounter (Signed)
[facility] to x-ray

## 2014-02-27 NOTE — Telephone Encounter (Signed)
Message copied by Koren Bound on Thu Feb 27, 2014  4:18 PM ------      Message from: Chipper Herb      Created: Wed Feb 26, 2014 10:23 PM       As per radiology report ------

## 2014-03-11 DIAGNOSIS — M1711 Unilateral primary osteoarthritis, right knee: Secondary | ICD-10-CM | POA: Diagnosis not present

## 2014-03-11 DIAGNOSIS — M25561 Pain in right knee: Secondary | ICD-10-CM | POA: Diagnosis not present

## 2014-03-12 ENCOUNTER — Other Ambulatory Visit (HOSPITAL_COMMUNITY): Payer: Self-pay | Admitting: Orthopedic Surgery

## 2014-03-12 DIAGNOSIS — M1712 Unilateral primary osteoarthritis, left knee: Secondary | ICD-10-CM

## 2014-03-18 ENCOUNTER — Ambulatory Visit (HOSPITAL_COMMUNITY)
Admission: RE | Admit: 2014-03-18 | Discharge: 2014-03-18 | Disposition: A | Discharge status: Home / Self Care | Payer: Medicare Other | Source: Ambulatory Visit | Attending: Orthopedic Surgery | Admitting: Orthopedic Surgery

## 2014-03-18 DIAGNOSIS — M25462 Effusion, left knee: Secondary | ICD-10-CM | POA: Insufficient documentation

## 2014-03-18 DIAGNOSIS — S83242A Other tear of medial meniscus, current injury, left knee, initial encounter: Secondary | ICD-10-CM | POA: Diagnosis not present

## 2014-03-18 DIAGNOSIS — M25562 Pain in left knee: Secondary | ICD-10-CM | POA: Diagnosis not present

## 2014-03-18 DIAGNOSIS — M1712 Unilateral primary osteoarthritis, left knee: Secondary | ICD-10-CM

## 2014-03-18 DIAGNOSIS — M179 Osteoarthritis of knee, unspecified: Secondary | ICD-10-CM | POA: Diagnosis not present

## 2014-03-18 DIAGNOSIS — X58XXXA Exposure to other specified factors, initial encounter: Secondary | ICD-10-CM | POA: Diagnosis not present

## 2014-03-18 DIAGNOSIS — S83232A Complex tear of medial meniscus, current injury, left knee, initial encounter: Secondary | ICD-10-CM | POA: Diagnosis not present

## 2014-04-01 DIAGNOSIS — M1712 Unilateral primary osteoarthritis, left knee: Secondary | ICD-10-CM | POA: Diagnosis not present

## 2014-04-07 ENCOUNTER — Ambulatory Visit: Payer: Medicare Other | Admitting: Family Medicine

## 2014-05-13 ENCOUNTER — Encounter: Payer: Self-pay | Admitting: Family Medicine

## 2014-05-13 ENCOUNTER — Ambulatory Visit (INDEPENDENT_AMBULATORY_CARE_PROVIDER_SITE_OTHER): Payer: Medicare Other | Admitting: Family Medicine

## 2014-05-13 VITALS — BP 153/88 | HR 63 | Temp 97.9°F | Ht 62.0 in | Wt 139.0 lb

## 2014-05-13 DIAGNOSIS — R202 Paresthesia of skin: Secondary | ICD-10-CM | POA: Diagnosis not present

## 2014-05-13 DIAGNOSIS — E559 Vitamin D deficiency, unspecified: Secondary | ICD-10-CM

## 2014-05-13 DIAGNOSIS — M1712 Unilateral primary osteoarthritis, left knee: Secondary | ICD-10-CM | POA: Diagnosis not present

## 2014-05-13 DIAGNOSIS — I10 Essential (primary) hypertension: Secondary | ICD-10-CM

## 2014-05-13 DIAGNOSIS — M5136 Other intervertebral disc degeneration, lumbar region: Secondary | ICD-10-CM

## 2014-05-13 NOTE — Progress Notes (Signed)
Subjective:    Patient ID: Lori Soto, female    DOB: 1935-08-11, 79 y.o.   MRN: [id number]  HPI Pt here for follow up and management of chronic medical problems which includes Hypertension. The patient's knee pain is improved. Dr. Ricki Rodriguez remove some fluid from the knee and put a shot of cortisone in the knee and it has been much better since that time. She is having numbness in both hands. She indicates this numbness is worse in the morning when she gets out of the bed. She cannot be specific about which fingers were involved on each hand and she does indicate it is worse when she has been using her hands more. She does not need refills on any of her medicines. She will return to the office for fasting lab work. The patient's blood pressure is elevated here today and she indicates that at home it is always good.      Patient Active Problem List   Diagnosis Date Noted  . Osteopenia of the elderly 02/19/2014  . DDD (degenerative disc disease), lumbar 12/03/2013  . Scoliosis 12/03/2013  . Chronic LBP 05/14/2013  . IBS (irritable bowel syndrome) 11/08/2012  . UC (ulcerative colitis) 04/23/2012  . Palpitation 10/26/2011  . Hypertension 10/26/2011  . Chronic ulcerative colitis 03/23/2011   Outpatient Encounter Prescriptions as of 05/13/2014  Medication Sig  . aspirin 81 MG tablet Take 81 mg by mouth daily.    . Calcium Carbonate-Vitamin D (CALCIUM 600+D) 600-400 MG-UNIT per tablet Take 1 tablet by mouth daily.  [name] 500 MG CAPS Take by mouth.  . Coenzyme Q10 (CO Q-10 PO) Take by mouth daily. With red yeast rice Pur[employer] Pride Brand  . COLLAGEN PO Take by mouth 3 (three) times daily. This is known as 1-2-3  . Cyanocobalamin (VITAMIN B 12 PO) Take 1,000 mcg by mouth daily.   [name] estradiol (ESTRACE) 1 MG tablet Take 1 mg by mouth every other day.   . Ginkgo Biloba 40 MG TABS Take 120 mg by mouth daily.    [name] glucosamine-chondroitin 500-400 MG tablet Take 1 tablet by mouth 2 (two)  times daily.    . Mesalamine (ASACOL HD) 800 MG TBEC Take 2 tablets (1,600 mg total) by mouth 2 (two) times daily.  . Misc Natural Products (BUTCHERS BROOM PO) Take by mouth daily.    . MULTIPLE VITAMINS PO Take by mouth daily.    . Nutritional Supplements (GRAPESEED EXTRACT PO) Take by mouth. 60 mg 1 tab a day  . OVER THE COUNTER MEDICATION daily. Vision Gold Luten 66 mg & Billberry 20 mg  . POTASSIUM GLUCONATE PO Take 99 mg by mouth daily. This is Chelated Potassium -Puritans Pride  . pyridOXINE (VITAMIN B-6) 100 MG tablet Take 100 mg by mouth daily.   . Vitamins C E (CRANBERRY CONCENTRATE PO) Take 2,500 mg by mouth daily.   . Zinc 50 MG CAPS Take 25 mg by mouth daily.      Review of Systems  Constitutional: Negative.   HENT: Negative.   Eyes: Negative.   Respiratory: Negative.   Cardiovascular: Negative.   Gastrointestinal: Negative.   Endocrine: Negative.   Genitourinary: Positive for hematuria and genital sores.  Musculoskeletal: Positive for arthralgias (bilateral knee pain - is some better ).  Skin: Negative.   Allergic/Immunologic: Negative.   Neurological: Negative.        Numbness of bilateral hands at times  Hematological: Negative.   Psychiatric/Behavioral: Negative.  Objective:   Physical Exam  Constitutional: She is oriented to person, place, and time. She appears well-developed and well-nourished. No distress.  HENT:  Head: Normocephalic and atraumatic.  Right Ear: External ear normal.  Left Ear: External ear normal.  Nose: Nose normal.  Mouth/Throat: Oropharynx is clear and moist. No oropharyngeal exudate.  Eyes: Conjunctivae and EOM are normal. Pupils are equal, round, and reactive to light. Right eye exhibits no discharge. Left eye exhibits no discharge. No scleral icterus.  Neck: Normal range of motion. Neck supple. No JVD present. No thyromegaly present.  No thyromegaly or carotid bruits  Cardiovascular: Normal rate, regular rhythm, normal heart  sounds and intact distal pulses.   No murmur heard. At 84/m with a regular rate and rhythm  Pulmonary/Chest: Effort normal and breath sounds normal. No respiratory distress. She has no wheezes. She has no rales. She exhibits no tenderness.  Abdominal: Soft. Bowel sounds are normal. She exhibits no mass. There is no tenderness. There is no rebound and no guarding.  No abdominal tenderness  Musculoskeletal: Normal range of motion. She exhibits no edema or tenderness.  Lymphadenopathy:    She has no cervical adenopathy.  Neurological: She is alert and oriented to person, place, and time. She has normal reflexes. No cranial nerve deficit.  Tinel sign is positive in left hand greater than right  Skin: Skin is warm and dry. No rash noted.  Psychiatric: She has a normal mood and affect. Her behavior is normal. Judgment and thought content normal.  Nursing note and vitals reviewed.  BP 163/87 mmHg  Pulse 63  Temp(Src) 97.9 F (36.6 C) (Oral)  Ht 5' 2"  (1.575 m)  Wt 139 lb (63.05 kg)  BMI 25.42 kg/m2        Assessment & Plan:  1. Essential hypertension -Watch sodium intake and bring blood pressures to each visit for review -Bring blood pressures with you at the next lab check. - POCT CBC; Future - BMP8+EGFR; Future - Hepatic function panel; Future - NMR, lipoprofile; Future  2. Paresthesia of both hands -Wear wrist braces at nighttime when sleeping, see if this helps and give [place] feedback - POCT CBC; Future - Vitamin B12; Future - Vit D  25 hydroxy (rtn osteoporosis monitoring); Future  3. DDD (degenerative disc disease), lumbar -Continue with support brace - POCT CBC; Future  4. Primary osteoarthritis of left knee -Follow-up with orthopedist as needed - POCT CBC; Future  5. Vitamin D deficiency -Continue vitamin D - Vit D  25 hydroxy (rtn osteoporosis monitoring); Future  Patient Instructions                       Medicare Annual Wellness Visit  [facility] and the  medical providers at [facility] strive to bring you the best medical care.  In doing so we not only want to address your current medical conditions and concerns but also to detect new conditions early and prevent illness, disease and health-related problems.    Medicare offers a yearly Wellness Visit which allows our clinical staff to assess your need for preventative services including immunizations, lifestyle education, counseling to decrease risk of preventable diseases and screening for fall risk and other medical concerns.    This visit is provided free of charge (no copay) for all Medicare recipients. The clinical pharmacists at [facility] have begun to conduct these Wellness Visits which will also include a thorough review of all your medications.  As you primary medical provider recommend that you make an appointment for your Annual Wellness Visit if you have not done so already this year.  You may set up this appointment before you leave today or you may call back ([phone]) and schedule an appointment.  Please make sure when you call that you mention that you are scheduling your Annual Wellness Visit with the clinical pharmacist so that the appointment may be made for the proper length of time.      Continue current medications. Continue good therapeutic lifestyle changes which include good diet and exercise. Fall precautions discussed with patient. If an FOBT was given today- please return it to our front desk. If you are over 57 years old - you may need Prevnar 55 or the adult Pneumonia vaccine.  Flu Shots will be available at our office starting mid- September. Please call and schedule a FLU CLINIC APPOINTMENT.   Watch sodium intake Monitor blood pressures at home and more frequently in the next couple weeks and bring these readings by for review so that we can make sure your blood pressure is coming back down After a couple weeks  continue to monitor blood pressures closely, and every time you come to the office bring these blood pressure so we can see what they're doing at home. Because of the symptoms you're having with the neuropathy in the hands, if the symptoms continue please use wrist braces on your hands when you're sleeping at nighttime Keep follow-up appointment with orthopedist if the problems with your knees continue or schedule one further out.   Arrie Senate MD

## 2014-05-13 NOTE — Patient Instructions (Addendum)
Medicare Annual Wellness Visit  [facility] and the medical providers at [facility] strive to bring you the best medical care.  In doing so we not only want to address your current medical conditions and concerns but also to detect new conditions early and prevent illness, disease and health-related problems.    Medicare offers a yearly Wellness Visit which allows our clinical staff to assess your need for preventative services including immunizations, lifestyle education, counseling to decrease risk of preventable diseases and screening for fall risk and other medical concerns.    This visit is provided free of charge (no copay) for all Medicare recipients. The clinical pharmacists at [facility] have begun to conduct these Wellness Visits which will also include a thorough review of all your medications.    As you primary medical provider recommend that you make an appointment for your Annual Wellness Visit if you have not done so already this year.  You may set up this appointment before you leave today or you may call back ([phone]) and schedule an appointment.  Please make sure when you call that you mention that you are scheduling your Annual Wellness Visit with the clinical pharmacist so that the appointment may be made for the proper length of time.      Continue current medications. Continue good therapeutic lifestyle changes which include good diet and exercise. Fall precautions discussed with patient. If an FOBT was given today- please return it to our front desk. If you are over 38 years old - you may need Prevnar 59 or the adult Pneumonia vaccine.  Flu Shots will be available at our office starting mid- September. Please call and schedule a FLU CLINIC APPOINTMENT.   Watch sodium intake Monitor blood pressures at home and more frequently in the next couple weeks and bring these readings by for review so that we can  make sure your blood pressure is coming back down After a couple weeks continue to monitor blood pressures closely, and every time you come to the office bring these blood pressure so we can see what they're doing at home. Because of the symptoms you're having with the neuropathy in the hands, if the symptoms continue please use wrist braces on your hands when you're sleeping at nighttime Keep follow-up appointment with orthopedist if the problems with your knees continue or schedule one further out.

## 2014-05-18 ENCOUNTER — Other Ambulatory Visit (INDEPENDENT_AMBULATORY_CARE_PROVIDER_SITE_OTHER): Payer: Self-pay | Admitting: Internal Medicine

## 2014-05-19 ENCOUNTER — Encounter (INDEPENDENT_AMBULATORY_CARE_PROVIDER_SITE_OTHER): Payer: Self-pay | Admitting: Internal Medicine

## 2014-05-19 ENCOUNTER — Ambulatory Visit (INDEPENDENT_AMBULATORY_CARE_PROVIDER_SITE_OTHER): Payer: Medicare Other | Admitting: Internal Medicine

## 2014-05-19 VITALS — BP 140/80 | HR 68 | Temp 97.6°F | Resp 18 | Ht 63.0 in | Wt 137.0 lb

## 2014-05-19 DIAGNOSIS — K5190 Ulcerative colitis, unspecified, without complications: Secondary | ICD-10-CM

## 2014-05-19 MED ORDER — ONDANSETRON 4 MG PO TBDP: 4.0000 mg | ORAL_TABLET | Freq: Two times a day (BID) | ORAL | Status: DC | PRN

## 2014-05-19 NOTE — Progress Notes (Signed)
Presenting complaint;  Follow-up for ulcerative colitis.  Subjective:  Patient is 58 old Caucasian female with chronic ulcerative colitis was in for scheduled visit. She was seen 6 months ago. She has no GI complaints. Her bowels move daily. She usually has soft to formed stool. She has not experienced abdominal pain lately. She denies melena or rectal bleeding. Her appetite is very good. Her weight has been stable. She says she had positive Hemoccult in August and September 2015. She is scheduled to have repeat blood work by Dr. Laurance Flatten. She complains of bilateral knee pain. She had fluid removed from her left knee and cortisone injection which has helped. She's been told that she may need knee replacement. She may occasionally take OTC ibuprofen for knee pain. Patient's last colonoscopy was in January 2012 and was not completed because she had very active colitis.   Current Medications: Outpatient Encounter Prescriptions as of 05/19/2014  Medication Sig  . aspirin 81 MG tablet Take 81 mg by mouth daily.    . Calcium Carbonate-Vitamin D (CALCIUM 600+D) 600-400 MG-UNIT per tablet Take 1 tablet by mouth daily. (Patient taking differently: Take 1 tablet by mouth every evening. )  . Cayenne 500 MG CAPS Take by mouth.  . Coenzyme Q10 (CO Q-10 PO) Take by mouth daily. With red yeast rice Pur[employer] Brand  . COLLAGEN PO Take by mouth 3 (three) times daily. This is known as 1-2-3  . Cyanocobalamin (VITAMIN B 12 PO) Take 1,000 mcg by mouth daily.   [name] estradiol (ESTRACE) 1 MG tablet Take 1 mg by mouth every other day.   . Ginkgo Biloba 40 MG TABS Take 120 mg by mouth daily.    [name] glucosamine-chondroitin 500-400 MG tablet Take 1 tablet by mouth 2 (two) times daily.    . Mesalamine (ASACOL HD) 800 MG TBEC Take 2 tablets (1,600 mg total) by mouth 2 (two) times daily.  . Misc Natural Products (BUTCHERS BROOM PO) Take by mouth daily.    . MULTIPLE VITAMINS PO Take by mouth daily. In the mornings  .  Nutritional Supplements (GRAPESEED EXTRACT PO) Take by mouth. 60 mg 1 tab a day  . OVER THE COUNTER MEDICATION daily. Vision Gold Luten 66 mg & Billberry 20 mg  . POTASSIUM GLUCONATE PO Take 99 mg by mouth daily. This is Chelated Potassium -Purit[employer] Pride  . pyridOXINE (VITAMIN B-6) 100 MG tablet Take 100 mg by mouth daily.   . Vitamins C E (CRANBERRY CONCENTRATE PO) Take 2,500 mg by mouth daily.   . Zinc 50 MG CAPS Take 25 mg by mouth daily.   . [DISCONTINUED] ondansetron (ZOFRAN-ODT) 4 MG disintegrating tablet Take 1 tablet (4 mg total) by mouth 2 (two) times daily as needed for nausea or vomiting.     Objective: Blood pressure 140/80, pulse 68, temperature 97.6 F (36.4 C), temperature source Oral, resp. rate 18, height 5' 3"  (1.6 m), weight 137 lb (62.143 kg). Patient is alert and in no acute distress. Conjunctiva is pink. Sclera is nonicteric Oropharyngeal mucosa is normal. No neck masses or thyromegaly noted. Cardiac exam with regular rhythm normal S1 and S2. No murmur or gallop noted. Lungs are clear to auscultation. Abdomen symmetrical soft and nontender. No organomegaly or masses. Rectal examination reveals formed stool in the vault and it is guaiac-negative. No LE edema or clubbing noted.  Labs/studies Results:   fecal occult blood was positive in August 2015 and September 2015. CBC from 02/19/2014. WBC 5.9, H&H 13.3 and 41.1 MCV  88.1 and platelet count 292K.  Assessment:  #1. Ulcerative colitis remains in remission. She will continue current dose of Asacol HD which is 1600 mg twice a day #2. History of heme-positive stool. H&H 3 months ago was normal. Her stool is guaiac-negative today. She will be due for next colonoscopy in January 2017. However if she is noted to have another heme positive stool or drop in H&H will change plans to proceed to colonoscopy this year.   Plan:  Continue Asacol HD at 1600 mg by mouth twice a day. Patient advised to call office if she has  melena or rectal bleeding. Will review future blood work to make sure her H&H remains normal. Evidence colonoscopy in January 2017. If she has  rectal bleeding bleeding or heme positive stool. Office visit in 6 months.

## 2014-05-28 ENCOUNTER — Other Ambulatory Visit (INDEPENDENT_AMBULATORY_CARE_PROVIDER_SITE_OTHER): Payer: Medicare Other

## 2014-05-28 DIAGNOSIS — M5136 Other intervertebral disc degeneration, lumbar region: Secondary | ICD-10-CM

## 2014-05-28 DIAGNOSIS — I10 Essential (primary) hypertension: Secondary | ICD-10-CM

## 2014-05-28 DIAGNOSIS — R209 Unspecified disturbances of skin sensation: Secondary | ICD-10-CM | POA: Diagnosis not present

## 2014-05-28 DIAGNOSIS — E559 Vitamin D deficiency, unspecified: Secondary | ICD-10-CM | POA: Diagnosis not present

## 2014-05-28 DIAGNOSIS — R202 Paresthesia of skin: Secondary | ICD-10-CM | POA: Diagnosis not present

## 2014-05-28 DIAGNOSIS — M1712 Unilateral primary osteoarthritis, left knee: Secondary | ICD-10-CM

## 2014-05-28 NOTE — Addendum Note (Signed)
Addended by: Selmer Dominion on: 05/28/2014 01:38 PM   Modules accepted: Orders

## 2014-05-28 NOTE — Progress Notes (Signed)
LAB ONLY 

## 2014-05-29 LAB — CBC WITH DIFFERENTIAL
BASOS: 1 %
Basophils Absolute: 0 10*3/uL (ref 0.0–0.2)
EOS: 3 %
Eosinophils Absolute: 0.2 10*3/uL (ref 0.0–0.4)
HCT: 41.4 % (ref 34.0–46.6)
Hemoglobin: 13.8 g/dL (ref 11.1–15.9)
Immature Grans (Abs): 0 10*3/uL (ref 0.0–0.1)
Immature Granulocytes: 0 %
Lymphocytes Absolute: 1.8 10*3/uL (ref 0.7–3.1)
Lymphs: 29 %
MCH: 29.1 pg (ref 26.6–33.0)
MCHC: 33.3 g/dL (ref 31.5–35.7)
MCV: 87 fL (ref 79–97)
MONOS ABS: 0.5 10*3/uL (ref 0.1–0.9)
Monocytes: 8 %
NEUTROS PCT: 59 %
Neutrophils Absolute: 3.8 10*3/uL (ref 1.4–7.0)
RBC: 4.75 x10E6/uL (ref 3.77–5.28)
RDW: 14.1 % (ref 12.3–15.4)
WBC: 6.3 10*3/uL (ref 3.4–10.8)

## 2014-05-29 LAB — BMP8+EGFR
BUN/Creatinine Ratio: 12 (ref 11–26)
BUN: 8 mg/dL (ref 8–27)
CHLORIDE: 100 mmol/L (ref 97–108)
CO2: 27 mmol/L (ref 18–29)
CREATININE: 0.67 mg/dL (ref 0.57–1.00)
Calcium: 9.5 mg/dL (ref 8.7–10.3)
GFR calc Af Amer: 99 mL/min/{1.73_m2} (ref >59)
GFR calc non Af Amer: 86 mL/min/{1.73_m2} (ref >59)
Glucose: 79 mg/dL (ref 65–99)
POTASSIUM: 4.7 mmol/L (ref 3.5–5.2)
SODIUM: 142 mmol/L (ref 134–144)

## 2014-05-29 LAB — HEPATIC FUNCTION PANEL
ALK PHOS: 40 IU/L (ref 39–117)
ALT: 16 IU/L (ref 0–32)
AST: 17 IU/L (ref 0–40)
Albumin: 4.2 g/dL (ref 3.5–4.8)
BILIRUBIN DIRECT: 0.17 mg/dL (ref 0.00–0.40)
BILIRUBIN TOTAL: 0.5 mg/dL (ref 0.0–1.2)
TOTAL PROTEIN: 7.2 g/dL (ref 6.0–8.5)

## 2014-05-29 LAB — VITAMIN D 25 HYDROXY (VIT D DEFICIENCY, FRACTURES): Vit D, 25-Hydroxy: 48.1 ng/mL (ref 30.0–100.0)

## 2014-05-29 LAB — NMR, LIPOPROFILE
Cholesterol: 172 mg/dL (ref 100–199)
HDL CHOLESTEROL BY NMR: 63 mg/dL (ref >39)
HDL Particle Number: 37.9 umol/L (ref >=30.5)
LDL PARTICLE NUMBER: 1036 nmol/L — AB (ref <1000)
LDL Size: 21.8 nm (ref >20.5)
LDL-C: 98 mg/dL (ref 0–99)
LP-IR Score: <25 (ref <=45)
Small LDL Particle Number: 303 nmol/L (ref <=527)
Triglycerides by NMR: 55 mg/dL (ref 0–149)

## 2014-05-29 LAB — VITAMIN B12

## 2014-05-30 LAB — PLATELET COUNT: Platelets: 349 10*3/uL (ref 150–379)

## 2014-05-30 LAB — SPECIMEN STATUS REPORT

## 2014-06-05 ENCOUNTER — Ambulatory Visit: Payer: Medicare Other | Admitting: Family Medicine

## 2014-06-05 DIAGNOSIS — L729 Follicular cyst of the skin and subcutaneous tissue, unspecified: Secondary | ICD-10-CM | POA: Diagnosis not present

## 2014-06-05 DIAGNOSIS — L728 Other follicular cysts of the skin and subcutaneous tissue: Secondary | ICD-10-CM | POA: Diagnosis not present

## 2014-06-06 DIAGNOSIS — L729 Follicular cyst of the skin and subcutaneous tissue, unspecified: Secondary | ICD-10-CM | POA: Diagnosis not present

## 2014-06-23 DIAGNOSIS — Z90710 Acquired absence of both cervix and uterus: Secondary | ICD-10-CM | POA: Diagnosis not present

## 2014-06-23 DIAGNOSIS — R87612 Low grade squamous intraepithelial lesion on cytologic smear of cervix (LGSIL): Secondary | ICD-10-CM | POA: Diagnosis not present

## 2014-06-23 DIAGNOSIS — R87622 Low grade squamous intraepithelial lesion on cytologic smear of vagina (LGSIL): Secondary | ICD-10-CM | POA: Diagnosis not present

## 2014-07-02 DIAGNOSIS — M1711 Unilateral primary osteoarthritis, right knee: Secondary | ICD-10-CM | POA: Diagnosis not present

## 2014-07-02 DIAGNOSIS — M1712 Unilateral primary osteoarthritis, left knee: Secondary | ICD-10-CM | POA: Diagnosis not present

## 2014-07-03 DIAGNOSIS — L729 Follicular cyst of the skin and subcutaneous tissue, unspecified: Secondary | ICD-10-CM | POA: Diagnosis not present

## 2014-09-03 ENCOUNTER — Ambulatory Visit: Payer: Medicare Other | Admitting: Family Medicine

## 2014-09-23 DIAGNOSIS — H35363 Drusen (degenerative) of macula, bilateral: Secondary | ICD-10-CM | POA: Diagnosis not present

## 2014-11-14 DIAGNOSIS — Z1231 Encounter for screening mammogram for malignant neoplasm of breast: Secondary | ICD-10-CM | POA: Diagnosis not present

## 2014-11-14 LAB — HM MAMMOGRAPHY: HM Mammogram: NEGATIVE

## 2014-11-17 ENCOUNTER — Ambulatory Visit (INDEPENDENT_AMBULATORY_CARE_PROVIDER_SITE_OTHER): Payer: Medicare Other | Admitting: Internal Medicine

## 2014-11-17 ENCOUNTER — Encounter (INDEPENDENT_AMBULATORY_CARE_PROVIDER_SITE_OTHER): Payer: Self-pay | Admitting: Internal Medicine

## 2014-11-17 VITALS — BP 120/72 | HR 70 | Temp 98.9°F | Resp 18 | Ht 63.0 in | Wt 135.0 lb

## 2014-11-17 DIAGNOSIS — K5190 Ulcerative colitis, unspecified, without complications: Secondary | ICD-10-CM

## 2014-11-17 NOTE — Progress Notes (Signed)
Presenting complaint;  Follow-up for ulcerative colitis.  Subjective:  Patient is 52 old Caucasian female was chronic ulcerative colitis and is here for scheduled visit. She was last seen 6 months ago. She has no complaints. She has one to 2 formed stools per day. She denies melena or rectal bleeding abdominal pain other than occasional gas pain relieved with passage of flatus. His chronic low back pain and arthritis but she is able to work in the yard and stay busy. She states she just paces herself. She is not having any side effects with oral mesalamine.   Current Medications: Outpatient Encounter Prescriptions as of 11/17/2014  Medication Sig  . ASACOL HD 800 MG TBEC TAKE 2 TABLETS (1,600 MG) TWICE A DAY  . aspirin 81 MG tablet Take 81 mg by mouth daily.    . Bromelains 500 MG TABS Take 500 mg by mouth daily.  . Calcium Carbonate-Vitamin D (CALCIUM 600+D) 600-400 MG-UNIT per tablet Take 1 tablet by mouth daily. (Patient taking differently: Take 1 tablet by mouth every evening. )  . Cayenne 500 MG CAPS Take by mouth.  . Coenzyme Q10 (CO Q-10 PO) Take by mouth daily. With red yeast rice Pur[employer] Brand  . COLLAGEN PO Take by mouth 3 (three) times daily. This is known as 1-2-3  . Cyanocobalamin (VITAMIN B 12 PO) Take 1,000 mcg by mouth daily.   [name] estradiol (ESTRACE) 1 MG tablet Take 1 mg by mouth every other day.   . Ginkgo Biloba 40 MG TABS Take 120 mg by mouth daily.    [name] glucosamine-chondroitin 500-400 MG tablet Take 1 tablet by mouth 2 (two) times daily.    [name] Hyaluronic Acid-Vitamin C (HYALURONIC ACID PO) Take 80 mg by mouth daily.  [name] MILK THISTLE PO Take 100 mg by mouth daily.  . Misc Natural Products (BUTCHERS BROOM PO) Take by mouth daily.    . MULTIPLE VITAMINS PO Take by mouth daily. In the mornings  . Nutritional Supplements (GRAPESEED EXTRACT PO) Take by mouth. 60 mg 1 tab a day  . OVER THE COUNTER MEDICATION daily. Vision Gold Luten 66 mg & Billberry 20 mg  . OVER THE  COUNTER MEDICATION Alphalipoic Acid 250 mg daily  . POTASSIUM GLUCONATE PO Take 99 mg by mouth daily. This is Chelated Potassium -Puritans Pride  . pyridOXINE (VITAMIN B-6) 100 MG tablet Take 100 mg by mouth daily.   . Turmeric 500 MG CAPS Take by mouth. Patient takes  1 in the morning, 1 at lunch and 1 at bedtime.  . Vitamins C E (CRANBERRY CONCENTRATE PO) Take 2,500 mg by mouth daily.   . Zinc 50 MG CAPS Take 25 mg by mouth daily.    No facility-administered encounter medications on file as of 11/17/2014.     Objective: Blood pressure 120/72, pulse 70, temperature 98.9 F (37.2 C), temperature source Oral, resp. rate 18, height 5' 3"  (1.6 m), weight 135 lb (61.236 kg). Patient is alert and in no acute distress. Conjunctiva is pink. Sclera is nonicteric Oropharyngeal mucosa is normal. No neck masses or thyromegaly noted. Cardiac exam with regular rhythm normal S1 and S2. No murmur or gallop noted. Lungs are clear to auscultation. Abdomen is symmetrical soft and nontender without organomegaly or masses. No LE edema or clubbing noted.  Labs/studies Results: No lab work on file since her last office visit    Assessment:  #1. Ulcerative colitis remains in remission. She will be due for surveillance colonoscopy next year.  Plan:  Continue Asacol HD at 1600 mg by mouth twice a day. Office visit in 6 months at which time will consider surveillance colonoscopy.

## 2014-11-17 NOTE — Patient Instructions (Signed)
Call if you have diarrhea or rectal bleeding.

## 2014-11-18 ENCOUNTER — Ambulatory Visit (INDEPENDENT_AMBULATORY_CARE_PROVIDER_SITE_OTHER): Payer: Medicare Other | Admitting: Internal Medicine

## 2014-12-05 ENCOUNTER — Ambulatory Visit (INDEPENDENT_AMBULATORY_CARE_PROVIDER_SITE_OTHER): Payer: Medicare Other | Admitting: Family Medicine

## 2014-12-05 ENCOUNTER — Encounter: Payer: Self-pay | Admitting: Family Medicine

## 2014-12-05 VITALS — BP 144/89 | HR 70 | Temp 97.0°F | Ht 63.0 in | Wt 133.0 lb

## 2014-12-05 DIAGNOSIS — E559 Vitamin D deficiency, unspecified: Secondary | ICD-10-CM | POA: Diagnosis not present

## 2014-12-05 DIAGNOSIS — M25562 Pain in left knee: Secondary | ICD-10-CM

## 2014-12-05 DIAGNOSIS — Z1212 Encounter for screening for malignant neoplasm of rectum: Secondary | ICD-10-CM

## 2014-12-05 DIAGNOSIS — M549 Dorsalgia, unspecified: Secondary | ICD-10-CM

## 2014-12-05 DIAGNOSIS — R200 Anesthesia of skin: Secondary | ICD-10-CM

## 2014-12-05 DIAGNOSIS — R208 Other disturbances of skin sensation: Secondary | ICD-10-CM

## 2014-12-05 DIAGNOSIS — I10 Essential (primary) hypertension: Secondary | ICD-10-CM

## 2014-12-05 LAB — POCT CBC
GRANULOCYTE PERCENT: 65.5 % (ref 37–80)
HCT, POC: 40.3 % (ref 37.7–47.9)
HEMOGLOBIN: 14 g/dL (ref 12.2–16.2)
LYMPH, POC: 1.8 (ref 0.6–3.4)
MCH, POC: 30.2 pg (ref 27–31.2)
MCHC: 34.7 g/dL (ref 31.8–35.4)
MCV: 87 fL (ref 80–97)
MPV: 7.9 fL (ref 0–99.8)
PLATELET COUNT, POC: 293 10*3/uL (ref 142–424)
POC GRANULOCYTE: 4 (ref 2–6.9)
POC LYMPH PERCENT: 28.7 %L (ref 10–50)
RBC: 4.63 M/uL (ref 4.04–5.48)
RDW, POC: 13.4 %
WBC: 6.1 10*3/uL (ref 4.6–10.2)

## 2014-12-05 NOTE — Progress Notes (Signed)
Subjective:    Patient ID: Lori Soto, female    DOB: 22-Aug-1935, 79 y.o.   MRN: [id number]  HPI Pt here for follow up and management of chronic medical problems which includes hypertension. She is taking medications regularly. She continues to have back pain and bilateral knee pain. She is also having some numbness and tingling in her hands and fingers. She has a history of degenerative lumbar disc disease. The patient is pleasant alert and in good spirits. Her problems continued to remain with her degenerative disc disease in her back and also with her knees especially the left knee. She also complains of tingling and numbness in both hands. She is use her hands a lot in her life. She denies chest pain shortness of breath GI issues with swallowing blood in the stool or black tarry bowel movements. She is passing her water without problems.       Patient Active Problem List   Diagnosis Date Noted  . Osteopenia of the elderly 02/19/2014  . DDD (degenerative disc disease), lumbar 12/03/2013  . Scoliosis 12/03/2013  . Chronic LBP 05/14/2013  . IBS (irritable bowel syndrome) 11/08/2012  . UC (ulcerative colitis) 04/23/2012  . Palpitation 10/26/2011  . Hypertension 10/26/2011  . Chronic ulcerative colitis 03/23/2011   Outpatient Encounter Prescriptions as of 12/05/2014  Medication Sig  . ASACOL HD 800 MG TBEC TAKE 2 TABLETS (1,600 MG) TWICE A DAY  . aspirin 81 MG tablet Take 81 mg by mouth daily.    . Bromelains 500 MG TABS Take 500 mg by mouth daily.  . Calcium Carbonate-Vitamin D (CALCIUM 600+D) 600-400 MG-UNIT per tablet Take 1 tablet by mouth daily. (Patient taking differently: Take 1 tablet by mouth every evening. )  . Cayenne 500 MG CAPS Take by mouth.  . Coenzyme Q10 (CO Q-10 PO) Take by mouth daily. With red yeast rice Puritans Pride Brand  . COLLAGEN PO Take by mouth 3 (three) times daily. This is known as 1-2-3  . Cyanocobalamin (VITAMIN B 12 PO) Take 1,000 mcg by mouth  daily.   [name] estradiol (ESTRACE) 1 MG tablet Take 1 mg by mouth every other day.   . Ginkgo Biloba 40 MG TABS Take 120 mg by mouth daily.    [name] glucosamine-chondroitin 500-400 MG tablet Take 1 tablet by mouth 2 (two) times daily.    [name] Hyaluronic Acid-Vitamin C (HYALURONIC ACID PO) Take 80 mg by mouth daily.  [name] MILK THISTLE PO Take 100 mg by mouth daily.  . Misc Natural Products (BUTCHERS BROOM PO) Take by mouth daily.    . MULTIPLE VITAMINS PO Take by mouth daily. In the mornings  . Nutritional Supplements (GRAPESEED EXTRACT PO) Take by mouth. 60 mg 1 tab a day  . OVER THE COUNTER MEDICATION daily. Vision Gold Luten 66 mg & Billberry 20 mg  . OVER THE COUNTER MEDICATION Alphalipoic Acid 250 mg daily  . POTASSIUM GLUCONATE PO Take 99 mg by mouth daily. This is Chelated Potassium -Puritans Pride  . pyridOXINE (VITAMIN B-6) 100 MG tablet Take 100 mg by mouth daily.   . Turmeric 500 MG CAPS Take by mouth. Patient takes  1 in the morning, 1 at lunch and 1 at bedtime.  . Vitamins C E (CRANBERRY CONCENTRATE PO) Take 2,500 mg by mouth daily.   . Zinc 50 MG CAPS Take 25 mg by mouth daily.    No facility-administered encounter medications on file as of 12/05/2014.     Review of Systems  Constitutional: Negative.   HENT: Negative.   Eyes: Negative.   Respiratory: Negative.   Cardiovascular: Negative.   Gastrointestinal: Negative.   Endocrine: Negative.   Genitourinary: Negative.   Musculoskeletal: Positive for back pain and arthralgias (bilateral knee pain).  Skin: Negative.   Allergic/Immunologic: Negative.   Neurological: Positive for numbness (and tingeling in hands and fingers).  Hematological: Negative.        Objective:   Physical Exam  Constitutional: She is oriented to person, place, and time. She appears well-developed and well-nourished. No distress.  Pleasant and alert and in good spirits  HENT:  Head: Normocephalic and atraumatic.  Right Ear: External ear normal.  Left Ear:  External ear normal.  Nose: Nose normal.  Mouth/Throat: Oropharynx is clear and moist.  Eyes: Conjunctivae and EOM are normal. Pupils are equal, round, and reactive to light. Right eye exhibits no discharge. Left eye exhibits no discharge. No scleral icterus.  Neck: Normal range of motion. Neck supple. No thyromegaly present.  Cardiovascular: Normal rate, regular rhythm, normal heart sounds and intact distal pulses.   No murmur heard. The rhythm is regular at 60/m  Pulmonary/Chest: Effort normal and breath sounds normal. No respiratory distress. She has no wheezes. She has no rales.  Clear anteriorly and posteriorly  Abdominal: Soft. Bowel sounds are normal. She exhibits no mass. There is no tenderness. There is no rebound and no guarding.  Nontender without masses or organ enlargement or bruits  Musculoskeletal: Normal range of motion. She exhibits tenderness. She exhibits no edema.  The left knee is tender at the bilateral joint lines and anteriorly and medially to the joint also. The patient has a curvature in her spine and is somewhat uncomfortable laying supine and sitting up. She also has muscle atrophy in both thenar muscles.  Lymphadenopathy:    She has no cervical adenopathy.  Neurological: She is alert and oriented to person, place, and time. She has normal reflexes. No cranial nerve deficit.  Skin: Skin is warm and dry. No rash noted.  Psychiatric: She has a normal mood and affect. Her behavior is normal. Judgment and thought content normal.  Nursing note and vitals reviewed.  BP 148/91 mmHg  Pulse 70  Temp(Src) 97 F (36.1 C) (Oral)  Ht _0  (1.6 m)  Wt 133 lb (60.328 kg)  BMI 23.57 kg/m2  Repeat blood pressure 144/89      Assessment & Plan:  1. Essential hypertension -Monitor blood pressures at home, watch sodium intake - POCT CBC - BMP8+EGFR - Hepatic function panel - Lipid panel  2. Vitamin D deficiency -Continue current vitamin D replacement pending results  of lab work - POCT CBC - Vit D  25 hydroxy (rtn osteoporosis monitoring)  3. Bilateral hand numbness -Appointment with neurology to do [facility] the testing - Ambulatory referral to Neurology  4. Back pain, unspecified location -Physical therapy and traction may be helpful as it has been in the past and this will be reordered and the patient understands this. - Ambulatory referral to Physical Therapy  5. Left knee pain -Appointment with orthopedist for possible injection and removal of fluid from left knee - Ambulatory referral to Orthopedic Surgery  Patient Instructions                       Medicare Annual Wellness Visit  [facility] and the medical providers at [facility] strive to bring you the best medical care.  In doing so we not  only want to address your current medical conditions and concerns but also to detect new conditions early and prevent illness, disease and health-related problems.    Medicare offers a yearly Wellness Visit which allows our clinical staff to assess your need for preventative services including immunizations, lifestyle education, counseling to decrease risk of preventable diseases and screening for fall risk and other medical concerns.    This visit is provided free of charge (no copay) for all Medicare recipients. The clinical pharmacists at [facility] have begun to conduct these Wellness Visits which will also include a thorough review of all your medications.    As you primary medical provider recommend that you make an appointment for your Annual Wellness Visit if you have not done so already this year.  You may set up this appointment before you leave today or you may call back ([phone]) and schedule an appointment.  Please make sure when you call that you mention that you are scheduling your Annual Wellness Visit with the clinical pharmacist so that the appointment may be made for the proper length of time.      Continue current medications. Continue good therapeutic lifestyle changes which include good diet and exercise. Fall precautions discussed with patient. If an FOBT was given today- please return it to our front desk. If you are over 76 years old - you may need Prevnar 12 or the adult Pneumonia vaccine.   After your visit with [place] today you will receive a survey in the mail or online from [employer] regarding your care with [place]. Please take a moment to fill this out. Your feedback is very important to [place] as you can help [place] better understand your patient needs as well as improve your experience and satisfaction. WE CARE ABOUT YOU!!!   We will arrange for the patient to have physical therapy and possible traction because of her ongoing back problems as this has helped in the past We will also schedule her for a future visit to see the orthopedic surgeon either next week or in October when he is back at our office Because of numbness and tingling in the hands we will also schedule her for a visit to the neurologist and PNCV testing She should continue to be careful with walking especially climbing stairs and climbing let her step stools to make sure that she does not fall and herself.   Arrie Senate MD

## 2014-12-05 NOTE — Patient Instructions (Addendum)
Medicare Annual Wellness Visit  [facility] and the medical providers at [facility] strive to bring you the best medical care.  In doing so we not only want to address your current medical conditions and concerns but also to detect new conditions early and prevent illness, disease and health-related problems.    Medicare offers a yearly Wellness Visit which allows our clinical staff to assess your need for preventative services including immunizations, lifestyle education, counseling to decrease risk of preventable diseases and screening for fall risk and other medical concerns.    This visit is provided free of charge (no copay) for all Medicare recipients. The clinical pharmacists at [facility] have begun to conduct these Wellness Visits which will also include a thorough review of all your medications.    As you primary medical provider recommend that you make an appointment for your Annual Wellness Visit if you have not done so already this year.  You may set up this appointment before you leave today or you may call back ([phone]) and schedule an appointment.  Please make sure when you call that you mention that you are scheduling your Annual Wellness Visit with the clinical pharmacist so that the appointment may be made for the proper length of time.     Continue current medications. Continue good therapeutic lifestyle changes which include good diet and exercise. Fall precautions discussed with patient. If an FOBT was given today- please return it to our front desk. If you are over 42 years old - you may need Prevnar 56 or the adult Pneumonia vaccine.   After your visit with [place] today you will receive a survey in the mail or online from [employer] regarding your care with [place]. Please take a moment to fill this out. Your feedback is very important to [place] as you can help [place] better understand your patient needs as well as  improve your experience and satisfaction. WE CARE ABOUT YOU!!!   We will arrange for the patient to have physical therapy and possible traction because of her ongoing back problems as this has helped in the past We will also schedule her for a future visit to see the orthopedic surgeon either next week or in October when he is back at our office Because of numbness and tingling in the hands we will also schedule her for a visit to the neurologist and PNCV testing She should continue to be careful with walking especially climbing stairs and climbing let her step stools to make sure that she does not fall and herself.

## 2014-12-06 LAB — BMP8+EGFR
BUN / CREAT RATIO: 25 (ref 11–26)
BUN: 16 mg/dL (ref 8–27)
CO2: 27 mmol/L (ref 18–29)
CREATININE: 0.65 mg/dL (ref 0.57–1.00)
Calcium: 9.9 mg/dL (ref 8.7–10.3)
Chloride: 98 mmol/L (ref 97–108)
GFR calc Af Amer: 100 mL/min/{1.73_m2} (ref >59)
GFR, EST NON AFRICAN AMERICAN: 87 mL/min/{1.73_m2} (ref >59)
GLUCOSE: 109 mg/dL — AB (ref 65–99)
Potassium: 4.4 mmol/L (ref 3.5–5.2)
SODIUM: 142 mmol/L (ref 134–144)

## 2014-12-06 LAB — LIPID PANEL
CHOL/HDL RATIO: 2.9 ratio (ref 0.0–4.4)
Cholesterol, Total: 211 mg/dL — ABNORMAL HIGH (ref 100–199)
HDL: 72 mg/dL (ref >39)
LDL Calculated: 120 mg/dL — ABNORMAL HIGH (ref 0–99)
TRIGLYCERIDES: 95 mg/dL (ref 0–149)
VLDL CHOLESTEROL CAL: 19 mg/dL (ref 5–40)

## 2014-12-06 LAB — HEPATIC FUNCTION PANEL
ALBUMIN: 4.4 g/dL (ref 3.5–4.8)
ALK PHOS: 39 IU/L (ref 39–117)
ALT: 17 IU/L (ref 0–32)
AST: 21 IU/L (ref 0–40)
BILIRUBIN TOTAL: 0.4 mg/dL (ref 0.0–1.2)
Bilirubin, Direct: 0.13 mg/dL (ref 0.00–0.40)
TOTAL PROTEIN: 7.1 g/dL (ref 6.0–8.5)

## 2014-12-06 LAB — VITAMIN D 25 HYDROXY (VIT D DEFICIENCY, FRACTURES): Vit D, 25-Hydroxy: 45.6 ng/mL (ref 30.0–100.0)

## 2014-12-08 ENCOUNTER — Telehealth: Payer: Self-pay | Admitting: Family Medicine

## 2014-12-08 ENCOUNTER — Other Ambulatory Visit: Payer: Self-pay | Admitting: *Deleted

## 2014-12-08 DIAGNOSIS — L111 Transient acantholytic dermatosis [Grover]: Secondary | ICD-10-CM | POA: Diagnosis not present

## 2014-12-08 DIAGNOSIS — D485 Neoplasm of uncertain behavior of skin: Secondary | ICD-10-CM | POA: Diagnosis not present

## 2014-12-08 DIAGNOSIS — R200 Anesthesia of skin: Secondary | ICD-10-CM

## 2014-12-08 DIAGNOSIS — L309 Dermatitis, unspecified: Secondary | ICD-10-CM | POA: Diagnosis not present

## 2014-12-08 NOTE — Telephone Encounter (Signed)
Patient states that she has been using her husbands handicapped placard and is needing a handicapped placard for herself please.

## 2014-12-09 ENCOUNTER — Telehealth: Payer: Self-pay | Admitting: Family Medicine

## 2014-12-09 ENCOUNTER — Other Ambulatory Visit: Payer: Medicare Other

## 2014-12-09 DIAGNOSIS — Z0289 Encounter for other administrative examinations: Secondary | ICD-10-CM

## 2014-12-09 NOTE — Telephone Encounter (Signed)
Called pt, form is ready

## 2014-12-10 ENCOUNTER — Other Ambulatory Visit: Payer: Medicare Other

## 2014-12-10 DIAGNOSIS — Z1212 Encounter for screening for malignant neoplasm of rectum: Secondary | ICD-10-CM | POA: Diagnosis not present

## 2014-12-10 NOTE — Addendum Note (Signed)
Addended by: Pollyann Kennedy F on: 12/10/2014 09:17 AM   Modules accepted: Orders

## 2014-12-12 LAB — FECAL OCCULT BLOOD, IMMUNOCHEMICAL: Fecal Occult Bld: NEGATIVE

## 2014-12-22 ENCOUNTER — Ambulatory Visit: Payer: Medicare Other | Admitting: Physical Therapy

## 2014-12-23 ENCOUNTER — Ambulatory Visit (INDEPENDENT_AMBULATORY_CARE_PROVIDER_SITE_OTHER): Payer: Medicare Other | Admitting: Neurology

## 2014-12-23 DIAGNOSIS — R200 Anesthesia of skin: Secondary | ICD-10-CM

## 2014-12-23 DIAGNOSIS — R208 Other disturbances of skin sensation: Secondary | ICD-10-CM | POA: Diagnosis not present

## 2014-12-23 DIAGNOSIS — G5603 Carpal tunnel syndrome, bilateral upper limbs: Secondary | ICD-10-CM

## 2014-12-23 NOTE — Procedures (Signed)
[facility]  [address], [place] [address]  [place], [place] [address] Tel: [phone] Fax:  [phone] Test Date:  12/23/2014  Patient: Lori Soto DOB: 1936/04/13 Physician: Narda Amber  Sex: Female Height: 5' 3"  Ref Phys: Redge Gainer, M.D.  ID#: [id number] Temp: 36.4C Technician: Jerilynn Mages. Dean   Patient Complaints: This is a 80 year old female presenting for evaluation of bilateral hand paresthesias and weakness.   NCV & EMG Findings: Extensive electrodiagnostic testing of the left upper extremity and additional studies of the right shows:  1. Left median sensory response is absent. Right median sensory response is prolonged (4.9 ms) and reduced in amplitude (6.4 V).  Bilateral ulnar and the right radial sensory responses within normal limits. 2. Bilateral median motor responses show prolonged distal latency (L9.8, R5.0 ms) and reduced amplitude (L0.9, R3.8 mV).  Bilateral ulnar motor responses are within normal limits. 3. Chronic motor axon loss changes are seen affecting bilateral abductor pollicis brevis muscles, without accompanied active denervation.  Impression: 1. Left median neuropathy at or distal to the wrist, consistent with the clinical diagnosis of carpal tunnel syndrome. Overall, these findings are very severe in degree electrically 2. Right median neuropathy at or distal to the wrist, consistent with the clinical diagnosis of carpal tunnel syndrome. Overall, these findings are moderate to severe in degree electrically.   ___________________________ Narda Amber, DO    Nerve Conduction Studies Anti Sensory Summary Table   Stim Site NR Peak (ms) Norm Peak (ms) P-T Amp (V) Norm P-T Amp  Left Median Anti Sensory (2nd Digit)  36.4C  Wrist NR  <3.8  >10  Right Median Anti Sensory (2nd Digit)  36.4C  Wrist    4.9 <3.8 6.4 >10  Left Radial Anti Sensory (Base 1st Digit)  36.4C  Wrist    1.9 <2.8 23.8 >10  Right Radial Anti Sensory (Base 1st Digit)   36.4C  Wrist    2.5 <2.8 21.2 >10  Left Ulnar Anti Sensory (5th Digit)  36.4C  Wrist    3.1 <3.2 12.8 >5  Right Ulnar Anti Sensory (5th Digit)  36.4C  Wrist    3.0 <3.2 16.6 >5   Motor Summary Table   Stim Site NR Onset (ms) Norm Onset (ms) O-P Amp (mV) Norm O-P Amp Site1 Site2 Delta-0 (ms) Dist (cm) Vel (m/s) Norm Vel (m/s)  Left Median Motor (Abd Poll Brev)  36.4C  Wrist    9.8 <4.0 0.9 >5 Elbow Wrist 4.8 24.0 50 >50  Elbow    14.6  0.7         Right Median Motor (Abd Poll Brev)  36.4C  Wrist    5.0 <4.0 3.8 >5 Elbow Wrist 5.2 26.0 50 >50  Elbow    10.2  3.2         Left Ulnar Motor (Abd Dig Minimi)  36.4C  Wrist    2.7 <3.1 7.6 >7 B Elbow Wrist 2.8 16.0 57 >50  B Elbow    5.5  7.2  A Elbow B Elbow 1.8 10.0 56 >50  A Elbow    7.3  6.6         Right Ulnar Motor (Abd Dig Minimi)  36.4C  Wrist    2.5 <3.1 8.3 >7 B Elbow Wrist 3.4 18.0 53 >50  B Elbow    5.9  7.6  A Elbow B Elbow 1.8 10.0 56 >50  A Elbow    7.7  7.0  EMG   Side Muscle Ins Act Fibs Psw Fasc Number Recrt Dur Dur. Amp Amp. Poly Poly. Comment  Right 1stDorInt Nml Nml Nml Nml Nml Nml Nml Nml Nml Nml Nml Nml N/A  Right Abd Poll Brev Nml Nml Nml Nml 3- Rapid All 1+ Many 1+ Nml Nml N/A  Right Ext Indicis Nml Nml Nml Nml Nml Nml Nml Nml Nml Nml Nml Nml N/A  Right PronatorTeres Nml Nml Nml Nml Nml Nml Nml Nml Nml Nml Nml Nml N/A  Right Biceps Nml Nml Nml Nml Nml Nml Nml Nml Nml Nml Nml Nml N/A  Right Triceps Nml Nml Nml Nml Nml Nml Nml Nml Nml Nml Nml Nml N/A  Left 1stDorInt Nml Nml Nml Nml Nml Nml Nml Nml Nml Nml Nml Nml N/A  Left Abd Poll Brev Nml Nml Nml Nml [facility] Rapid All 1+ All 1+ All 1+ N/A  Left PronatorTeres Nml Nml Nml Nml Nml Nml Nml Nml Nml Nml Nml Nml N/A  Left Biceps Nml Nml Nml Nml Nml Nml Nml Nml Nml Nml Nml Nml N/A  Left Triceps Nml Nml Nml Nml Nml Nml Nml Nml Nml Nml Nml Nml N/A  Left Deltoid Nml Nml Nml Nml Nml Nml Nml Nml Nml Nml Nml Nml N/A      Waveforms:

## 2014-12-25 ENCOUNTER — Other Ambulatory Visit: Payer: Self-pay | Admitting: *Deleted

## 2014-12-25 DIAGNOSIS — M1712 Unilateral primary osteoarthritis, left knee: Secondary | ICD-10-CM | POA: Diagnosis not present

## 2014-12-25 DIAGNOSIS — G5600 Carpal tunnel syndrome, unspecified upper limb: Secondary | ICD-10-CM

## 2014-12-31 ENCOUNTER — Ambulatory Visit: Payer: Medicare Other | Attending: Family Medicine | Admitting: Physical Therapy

## 2014-12-31 DIAGNOSIS — M546 Pain in thoracic spine: Secondary | ICD-10-CM | POA: Insufficient documentation

## 2014-12-31 DIAGNOSIS — M545 Low back pain, unspecified: Secondary | ICD-10-CM

## 2014-12-31 NOTE — Therapy (Signed)
[facility] [place], [place], [address] Phone: [phone]   Fax:  [phone]  Physical Therapy Evaluation  Patient Details  Name: Lori Soto MRN: [id number] Date of Birth: 01-15-1936 Referring Provider:  Chipper Herb, MD  Encounter Date: 12/31/2014      PT End of Session - 12/31/14 1244    Visit Number 1   Number of Visits 12   Date for PT Re-Evaluation 02/11/15   PT Start Time 1039   PT Stop Time 1128   PT Time Calculation (min) 49 min   Activity Tolerance Patient tolerated treatment well   Behavior During Therapy [facility] for tasks assessed/performed      Past Medical History  Diagnosis Date  . Ulcerative colitis   . Shingles   . Cataract   . H/O hiatal hernia   . Cancer     skin cancer on nose  . Anemia   . Scoliosis   . Osteopenia     Past Surgical History  Procedure Laterality Date  . Colonoscopy    . Upper gastrointestinal endoscopy    . Bunionectomy      Bilateral  . Hernia      Right inguinal  . Abdominal hysterectomy    . Hernia repair  1961    right inguinal hernia  . Cataract extraction w/phaco  05/28/2012    Procedure: CATARACT EXTRACTION PHACO AND INTRAOCULAR LENS PLACEMENT (IOC);  Surgeon: Tonny Branch, MD;  Location: [facility];  Service: Ophthalmology;  Laterality: Right;  CDE=12.84  . Cataract extraction w/phaco  06/07/2012    Procedure: CATARACT EXTRACTION PHACO AND INTRAOCULAR LENS PLACEMENT (IOC);  Surgeon: Tonny Branch, MD;  Location: [facility];  Service: Ophthalmology;  Laterality: Left;  CDE 17.60    There were no vitals filed for this visit.  Visit Diagnosis:  Bilateral thoracic back pain - Plan: PT plan of care cert/re-cert  Bilateral low back pain without sciatica - Plan: PT plan of care cert/re-cert      Subjective Assessment - 12/31/14 1247    Subjective Traction has alwayshelped me in the past.   Limitations Standing   How long can you stand comfortably? 10 to 15 minutes.    Currently in Pain? Yes   Pain Score 4    Pain Location Back   Pain Orientation Right;Left;Mid   Pain Descriptors / Indicators Aching   Pain Type Chronic pain   Pain Onset More than a month ago   Pain Frequency Constant   Aggravating Factors  Prolonged standing.   Pain Relieving Factors Traction.            [facility] PT Assessment - 12/31/14 0001    Assessment   Medical Diagnosis Back pain.   Onset Date/Surgical Date --  Ongoing.   Precautions   Precautions None   Restrictions   Weight Bearing Restrictions No   Balance Screen   Has the patient fallen in the past 6 months No   Has the patient had a decrease in activity level because of a fear of falling?  No   Is the patient reluctant to leave their home because of a fear of falling?  No   Home Environment   Living Environment Private residence   Prior Function   Level of Independence Independent   Posture/Postural Control   Posture Comments Thoracic convexity on left and right convexity in lumbar spine with a decrease in lumbar lordosis.   ROM / Strength   AROM / PROM /  Strength AROM;Strength   AROM   Overall AROM Comments Normal spinal motion into flexion and normal bilateral hip and knee range of motion (knees bilaterally are painful and arthritic).   Strength   Overall Strength Comments Normal bilateral LE strength.   Palpation   Palpation comment Diffuse c/o spinal pain over patient's thoracic and especially her lower lumbar region and associated spinal musculature.   Special Tests    Special Tests Lumbar;Leg LengthTest  Unable to elicit bil Achilles reflexes.   Lumbar Tests Straight Leg Raise   Leg length test  --  Equal leg lengths.   Straight Leg Raise   Findings Negative   Side  Left                   [facility] Adult PT Treatment/Exercise - 01/10/2015 0001    Modalities   Modalities Traction   Traction   Type of Traction Lumbar   Min (lbs) 5   Max (lbs) 60   Hold Time 99   Rest Time 5   Time  15                  PT Short Term Goals - 2015-01-10 1310    PT SHORT TERM GOAL #1   Title Ind with a HEP.   Time 2   Period Weeks   Status New           PT Long Term Goals - 01-10-15 1310    PT LONG TERM GOAL #1   Title Perform ADL's with pain not > 3/10.   Time 6   Period Weeks   Status New   PT LONG TERM GOAL #2   Title Stand 20 minutes with pain not > 3-4/10.   Time 6   Period Weeks   Status New               Plan - 01-10-2015 1252    Clinical Impression Statement The patient has a long history of diffuse spinal pain (especially lower lumbar) that hs always responded favorable to traction in the past.  Her pain-level today at rest is 3-4/10 and often times 7-8/10 with prolonged standing.   Pt will benefit from skilled therapeutic intervention in order to improve on the following deficits Pain;Decreased activity tolerance   Rehab Potential Good   PT Frequency 2x / week   PT Duration 6 weeks   PT Treatment/Interventions Traction;Moist Heat;Therapeutic exercise;Manual techniques   PT Next Visit Plan Int traction at 65# preceded by [facility].  STW/M to spinal musculature and core/back stabilization execises.          G-Codes - 2015-01-10 1309    Functional Assessment Tool Used FOTO.   Functional Limitation Mobility: Walking and moving around   Mobility: Walking and Moving Around Current Status [phone]) At least 40 percent but less than 60 percent impaired, limited or restricted   Mobility: Walking and Moving Around Goal Status [phone]) At least 20 percent but less than 40 percent impaired, limited or restricted       Problem List Patient Active Problem List   Diagnosis Date Noted  . Osteopenia of the elderly 02/19/2014  . DDD (degenerative disc disease), lumbar 12/03/2013  . Scoliosis 12/03/2013  . Chronic LBP 05/14/2013  . IBS (irritable bowel syndrome) 11/08/2012  . UC (ulcerative colitis) 04/23/2012  . Palpitation 10/26/2011  . Hypertension  10/26/2011  . Chronic ulcerative colitis 03/23/2011    Marcos Ruelas, [place] MPT 01/10/15, 1:14 PM  [facility] [place], [place], [address] Phone: [phone]   Fax:  [phone]

## 2015-01-02 ENCOUNTER — Telehealth: Payer: Self-pay | Admitting: Family Medicine

## 2015-01-07 ENCOUNTER — Ambulatory Visit: Payer: Medicare Other | Attending: Family Medicine | Admitting: Physical Therapy

## 2015-01-07 ENCOUNTER — Encounter: Payer: Self-pay | Admitting: Physical Therapy

## 2015-01-07 DIAGNOSIS — M545 Low back pain, unspecified: Secondary | ICD-10-CM

## 2015-01-07 DIAGNOSIS — M546 Pain in thoracic spine: Secondary | ICD-10-CM | POA: Insufficient documentation

## 2015-01-07 NOTE — Therapy (Signed)
[facility] [place], [place], [address] Phone: [phone]   Fax:  [phone]  Physical Therapy Treatment  Patient Details  Name: Lori Soto MRN: [id number] Date of Birth: October 04, 1935 Referring Provider:  Chipper Herb, MD  Encounter Date: 01/07/2015      PT End of Session - 01/07/15 1151    Visit Number 2   Number of Visits 12   Date for PT Re-Evaluation 02/11/15   PT Start Time 0768   PT Stop Time 1202   PT Time Calculation (min) 20 min   Activity Tolerance Patient tolerated treatment well   Behavior During Therapy [facility] for tasks assessed/performed      Past Medical History  Diagnosis Date  . Ulcerative colitis   . Shingles   . Cataract   . H/O hiatal hernia   . Cancer     skin cancer on nose  . Anemia   . Scoliosis   . Osteopenia     Past Surgical History  Procedure Laterality Date  . Colonoscopy    . Upper gastrointestinal endoscopy    . Bunionectomy      Bilateral  . Hernia      Right inguinal  . Abdominal hysterectomy    . Hernia repair  1961    right inguinal hernia  . Cataract extraction w/phaco  05/28/2012    Procedure: CATARACT EXTRACTION PHACO AND INTRAOCULAR LENS PLACEMENT (IOC);  Surgeon: Tonny Branch, MD;  Location: [facility];  Service: Ophthalmology;  Laterality: Right;  CDE=12.84  . Cataract extraction w/phaco  06/07/2012    Procedure: CATARACT EXTRACTION PHACO AND INTRAOCULAR LENS PLACEMENT (IOC);  Surgeon: Tonny Branch, MD;  Location: [facility];  Service: Ophthalmology;  Laterality: Left;  CDE 17.60    There were no vitals filed for this visit.  Visit Diagnosis:  Bilateral low back pain without sciatica  Bilateral thoracic back pain      Subjective Assessment - 01/07/15 1148    Subjective felt good after last treatment. arrived late today   Limitations Standing   How long can you stand comfortably? 10 to 15 minutes.   Currently in Pain? Yes   Pain Score 3    Pain Location Back    Pain Orientation Right;Left   Pain Descriptors / Indicators Aching   Pain Type Chronic pain   Pain Onset More than a month ago   Pain Frequency Constant   Aggravating Factors  prolong standing   Pain Relieving Factors traction                         [facility] Adult PT Treatment/Exercise - 01/07/15 0001    Modalities   Modalities Traction   Traction   Type of Traction Lumbar   Min (lbs) 5   Max (lbs) 65   Hold Time 99   Rest Time 5   Time 15                  PT Short Term Goals - 12/31/14 1310    PT SHORT TERM GOAL #1   Title Ind with a HEP.   Time 2   Period Weeks   Status New           PT Long Term Goals - 12/31/14 1310    PT LONG TERM GOAL #1   Title Perform ADL's with pain not > 3/10.   Time 6   Period Weeks   Status New  PT LONG TERM GOAL #2   Title Stand 20 minutes with pain not > 3-4/10.   Time 6   Period Weeks   Status New               Plan - 01/07/15 1153    Clinical Impression Statement Patient progressing with good response to traction thus far and increased to 65# today per MPT. Patient was 49mn late so only had traction today. Unable to meet any further goals due to pain deficits.    Pt will benefit from skilled therapeutic intervention in order to improve on the following deficits Pain;Decreased activity tolerance   Rehab Potential Good   Clinical Impairments Affecting Rehab Potential Patient wt 134#   PT Frequency 2x / week   PT Duration 6 weeks   PT Treatment/Interventions Traction;Moist Heat;Therapeutic exercise;Manual techniques   PT Next Visit Plan Cont with POC for posture/core exercises, issue HEP for supine core activation and review posture awareness techniques and traction per MPT   PT Home Exercise Plan issue HEP   Consulted and Agree with Plan of Care Patient        Problem List Patient Active Problem List   Diagnosis Date Noted  . Osteopenia of the elderly 02/19/2014  . DDD (degenerative  disc disease), lumbar 12/03/2013  . Scoliosis 12/03/2013  . Chronic LBP 05/14/2013  . IBS (irritable bowel syndrome) 11/08/2012  . UC (ulcerative colitis) 04/23/2012  . Palpitation 10/26/2011  . Hypertension 10/26/2011  . Chronic ulcerative colitis 03/23/2011    DPhillips Climes PTA 01/07/2015, 12:05 PM  [facility] [address] [address]Phone: [phone]  Fax:  [phone]

## 2015-01-12 ENCOUNTER — Telehealth: Payer: Self-pay | Admitting: Family Medicine

## 2015-01-13 NOTE — Telephone Encounter (Signed)
Patient aware that I do not see where we have called her not sure what is going on.

## 2015-01-15 ENCOUNTER — Ambulatory Visit: Payer: Medicare Other | Admitting: Physical Therapy

## 2015-01-15 ENCOUNTER — Encounter: Payer: Self-pay | Admitting: Physical Therapy

## 2015-01-15 DIAGNOSIS — M545 Low back pain, unspecified: Secondary | ICD-10-CM

## 2015-01-15 DIAGNOSIS — M546 Pain in thoracic spine: Secondary | ICD-10-CM | POA: Diagnosis not present

## 2015-01-15 NOTE — Therapy (Signed)
[facility] [place], [place], [address] Phone: [phone]   Fax:  [phone]  Physical Therapy Treatment  Patient Details  Name: Lori Soto MRN: [id number] Date of Birth: Jun 27, 1935 Referring Provider:  Chipper Herb, MD  Encounter Date: 01/15/2015      PT End of Session - 01/15/15 1040    Visit Number 3   Number of Visits 12   Date for PT Re-Evaluation 02/11/15   PT Start Time 9381   PT Stop Time 1104   PT Time Calculation (min) 35 min   Activity Tolerance Patient tolerated treatment well   Behavior During Therapy [facility] for tasks assessed/performed      Past Medical History  Diagnosis Date  . Ulcerative colitis   . Shingles   . Cataract   . H/O hiatal hernia   . Cancer     skin cancer on nose  . Anemia   . Scoliosis   . Osteopenia     Past Surgical History  Procedure Laterality Date  . Colonoscopy    . Upper gastrointestinal endoscopy    . Bunionectomy      Bilateral  . Hernia      Right inguinal  . Abdominal hysterectomy    . Hernia repair  1961    right inguinal hernia  . Cataract extraction w/phaco  05/28/2012    Procedure: CATARACT EXTRACTION PHACO AND INTRAOCULAR LENS PLACEMENT (IOC);  Surgeon: Tonny Branch, MD;  Location: [facility];  Service: Ophthalmology;  Laterality: Right;  CDE=12.84  . Cataract extraction w/phaco  06/07/2012    Procedure: CATARACT EXTRACTION PHACO AND INTRAOCULAR LENS PLACEMENT (IOC);  Surgeon: Tonny Branch, MD;  Location: [facility];  Service: Ophthalmology;  Laterality: Left;  CDE 17.60    There were no vitals filed for this visit.  Visit Diagnosis:  Bilateral low back pain without sciatica  Bilateral thoracic back pain      Subjective Assessment - 01/15/15 1037    Subjective felt good after last treatment   Limitations Standing   How long can you stand comfortably? 10 to 15 minutes.   Currently in Pain? Yes   Pain Score 3    Pain Location Back   Pain Orientation  Right;Left   Pain Descriptors / Indicators Sore;Aching   Pain Type Chronic pain   Pain Onset More than a month ago   Pain Frequency Constant   Aggravating Factors  prolong standing   Pain Relieving Factors traction                         [facility] Adult PT Treatment/Exercise - 01/15/15 0001    Exercises   Exercises Lumbar   Lumbar Exercises: Supine   Ab Set 20 reps;5 seconds   Bent Knee Raise 20 reps;3 seconds   Bridge 20 reps;3 seconds   Straight Leg Raise 20 reps;3 seconds   Modalities   Modalities Traction   Traction   Type of Traction Lumbar   Min (lbs) 5   Max (lbs) 70   Hold Time 99   Rest Time 5   Time 15                PT Education - 01/15/15 1040    Education provided Yes   Education Details HEP   Person(s) Educated Patient   Methods Explanation;Demonstration;Handout   Comprehension Verbalized understanding;Returned demonstration          PT Short Term Goals - 01/15/15 1055  PT SHORT TERM GOAL #1   Title Ind with a HEP.   Time 2   Period Weeks   Status Achieved           PT Long Term Goals - 01/15/15 1055    PT LONG TERM GOAL #1   Title Perform ADL's with pain not > 3/10.   Time 6   Period Weeks   Status On-going   PT LONG TERM GOAL #2   Title Stand 20 minutes with pain not > 3-4/10.   Time 6   Period Weeks   Status On-going               Plan - 01/15/15 1055    Clinical Impression Statement Patient progressing with good response to therapy and no pain increase. patient was given HEP for core activation ex's to stabilize back and decrease pain for overall increased functional independence. Increased lumbar traction today per MPT. Patient has met STG #1 today other LTG's ongoing due to pain deficit.   Pt will benefit from skilled therapeutic intervention in order to improve on the following deficits Pain;Decreased activity tolerance   Rehab Potential Good   Clinical Impairments Affecting Rehab Potential  patient weight 135#   PT Frequency 2x / week   PT Duration 6 weeks   PT Treatment/Interventions Traction;Moist Heat;Therapeutic exercise;Manual techniques   PT Next Visit Plan Cont with POC for posture/core exercises, issue HEP for supine core activation and review posture awareness techniques and traction per MPT   Consulted and Agree with Plan of Care Patient        Problem List Patient Active Problem List   Diagnosis Date Noted  . Osteopenia of the elderly 02/19/2014  . DDD (degenerative disc disease), lumbar 12/03/2013  . Scoliosis 12/03/2013  . Chronic LBP 05/14/2013  . IBS (irritable bowel syndrome) 11/08/2012  . UC (ulcerative colitis) 04/23/2012  . Palpitation 10/26/2011  . Hypertension 10/26/2011  . Chronic ulcerative colitis 03/23/2011    Phillips Climes, PTA 01/15/2015, 11:08 AM  [facility] [address] [place], [place], [address] Phone: [phone]   Fax:  [phone]

## 2015-01-15 NOTE — Patient Instructions (Signed)
Pelvic Tilt: Posterior - Legs Bent (Supine)   Tighten stomach and flatten back by rolling pelvis down. Hold _10___ seconds. Relax. Repeat _10-30___ times per set. Do __2__ sets per session. Do _2___ sessions per day.   . Straight Leg Raise   Tighten stomach and slowly raise locked right leg __4__ inches from floor. Repeat __10-30__ times per set. Do __2__ sets per session. Do __2__ sessions per day.    Self-Mobilization: Heel Slide (Supine)   Hold draw in and Slide left/Right heel toward buttocks until a gentle stretch is felt. Hold _5___ seconds. Relax. Repeat _10___ times per set. Do _2-3___ sets per session. Do _2___ sessions per day.  Bent Leg Lift (Hook-Lying)   Tighten stomach and slowly raise right leg _5___ inches from floor. Keep trunk rigid. Hold _3___ seconds. Repeat _10___ times per set. Do ___2-3_ sets per session. Do __2__ sessions per day.

## 2015-01-22 ENCOUNTER — Ambulatory Visit: Payer: Medicare Other | Admitting: Physical Therapy

## 2015-01-22 ENCOUNTER — Encounter: Payer: Self-pay | Admitting: Physical Therapy

## 2015-01-22 DIAGNOSIS — M545 Low back pain, unspecified: Secondary | ICD-10-CM

## 2015-01-22 DIAGNOSIS — M546 Pain in thoracic spine: Secondary | ICD-10-CM

## 2015-01-22 NOTE — Therapy (Signed)
[facility] [place], [place], [address] Phone: [phone]   Fax:  [phone]  Physical Therapy Treatment  Patient Details  Name: Lori Soto MRN: [id number] Date of Birth: [birth year] Referring Provider:  Chipper Herb, MD  Encounter Date: 01/22/2015      PT End of Session - 01/22/15 1527    Visit Number 4   Number of Visits 12   Date for PT Re-Evaluation 02/11/15   PT Start Time 7341   PT Stop Time 1623   PT Time Calculation (min) 60 min   Activity Tolerance Patient tolerated treatment well   Behavior During Therapy [facility] for tasks assessed/performed      Past Medical History  Diagnosis Date  . Ulcerative colitis   . Shingles   . Cataract   . H/O hiatal hernia   . Cancer     skin cancer on nose  . Anemia   . Scoliosis   . Osteopenia     Past Surgical History  Procedure Laterality Date  . Colonoscopy    . Upper gastrointestinal endoscopy    . Bunionectomy      Bilateral  . Hernia      Right inguinal  . Abdominal hysterectomy    . Hernia repair  1961    right inguinal hernia  . Cataract extraction w/phaco  05/28/2012    Procedure: CATARACT EXTRACTION PHACO AND INTRAOCULAR LENS PLACEMENT (IOC);  Surgeon: Tonny Branch, MD;  Location: [facility];  Service: Ophthalmology;  Laterality: Right;  CDE=12.84  . Cataract extraction w/phaco  06/07/2012    Procedure: CATARACT EXTRACTION PHACO AND INTRAOCULAR LENS PLACEMENT (IOC);  Surgeon: Tonny Branch, MD;  Location: [facility];  Service: Ophthalmology;  Laterality: Left;  CDE 17.60    There were no vitals filed for this visit.  Visit Diagnosis:  Bilateral low back pain without sciatica  Bilateral thoracic back pain      Subjective Assessment - 01/22/15 1522    Subjective Reports that stretches have helped her and her R knee is hurting. Reports back was fatigued in upper back yesterday.   Limitations Standing   How long can you stand comfortably? 10 to 15 minutes.             [facility] PT Assessment - 01/22/15 0001    Assessment   Medical Diagnosis Back pain.                     [facility] Adult PT Treatment/Exercise - 01/22/15 0001    Lumbar Exercises: Stretches   Single Knee to Chest Stretch 3 reps;30 seconds;Other (comment)  RLE   Lumbar Exercises: Supine   Ab Set 20 reps;5 seconds   Clam 20 reps   Bent Knee Raise 20 reps;3 seconds   Bridge 20 reps;3 seconds   Straight Leg Raise 20 reps;3 seconds  Bilaterally   Other Supine Lumbar Exercises 2# ball to lap x20 reps    Modalities   Modalities Moist Heat;Traction   Moist Heat Therapy   Number Minutes Moist Heat 15 Minutes   Moist Heat Location Lumbar Spine   Traction   Type of Traction Lumbar   Min (lbs) 5   Max (lbs) 70   Hold Time 99   Rest Time 5   Time 15                  PT Short Term Goals - 01/15/15 1055    PT SHORT TERM GOAL #1  Title Ind with a HEP.   Time 2   Period Weeks   Status Achieved           PT Long Term Goals - 01/15/15 1055    PT LONG TERM GOAL #1   Title Perform ADL's with pain not > 3/10.   Time 6   Period Weeks   Status On-going   PT LONG TERM GOAL #2   Title Stand 20 minutes with pain not > 3-4/10.   Time 6   Period Weeks   Status On-going               Plan - 01/22/15 1705    Clinical Impression Statement Patient tolerated treatment well today with good control with all core exercises. Required moderate multimodal cueing for proper technique and form. Educated to ice for 20 minutes maximum at home with ice packs. Normal modaliites response noted following removal of the modalities.    Pt will benefit from skilled therapeutic intervention in order to improve on the following deficits Pain;Decreased activity tolerance   Rehab Potential Good   Clinical Impairments Affecting Rehab Potential patient weight 135#   PT Frequency 2x / week   PT Duration 6 weeks   PT Treatment/Interventions Traction;Moist  Heat;Therapeutic exercise;Manual techniques   PT Next Visit Plan Cont with POC for posture/core exercises, issue HEP for supine core activation and review posture awareness techniques and traction per MPT   Consulted and Agree with Plan of Care Patient        Problem List Patient Active Problem List   Diagnosis Date Noted  . Osteopenia of the elderly 02/19/2014  . DDD (degenerative disc disease), lumbar 12/03/2013  . Scoliosis 12/03/2013  . Chronic LBP 05/14/2013  . IBS (irritable bowel syndrome) 11/08/2012  . UC (ulcerative colitis) 04/23/2012  . Palpitation 10/26/2011  . Hypertension 10/26/2011  . Chronic ulcerative colitis 03/23/2011    Wynelle Fanny, PTA 01/22/2015, 5:09 PM  [facility] [address] [place], [place], [address] Phone: [phone]   Fax:  [phone]

## 2015-01-23 ENCOUNTER — Encounter: Payer: Self-pay | Admitting: Pharmacist

## 2015-01-23 ENCOUNTER — Ambulatory Visit (INDEPENDENT_AMBULATORY_CARE_PROVIDER_SITE_OTHER): Payer: Medicare Other | Admitting: Pharmacist

## 2015-01-23 VITALS — BP 138/76 | HR 64 | Ht 60.0 in | Wt 135.0 lb

## 2015-01-23 DIAGNOSIS — Z23 Encounter for immunization: Secondary | ICD-10-CM

## 2015-01-23 DIAGNOSIS — Z0000 Encounter for general adult medical examination without abnormal findings: Secondary | ICD-10-CM | POA: Diagnosis not present

## 2015-01-23 MED ORDER — CALCIUM CARBONATE-VITAMIN D 600-400 MG-UNIT PO TABS: 1.0000 | ORAL_TABLET | Freq: Every evening | ORAL | Status: DC

## 2015-01-23 NOTE — Progress Notes (Signed)
Patient ID: Lori Soto, female   DOB: 09/11/1935, 79 y.o.   MRN: [id number]    Subjective:   Lori Soto is a 79 y.o. female who presents for a subsequent Medicare Annual Wellness Visit.  Lori. Soto is widowed and lives alone in [place], [place].   She is in good health and her main health concern presently is left knee pain for which she has recently had evaluated by orthopedist - Dr Elmyra Ricks.  She reports that pain is improving since this visit.    Current Medications (verified) Outpatient Encounter Prescriptions as of 01/23/2015  Medication Sig  . ASACOL HD 800 MG TBEC TAKE 2 TABLETS (1,600 MG) TWICE A DAY  . Bromelains 500 MG TABS Take 500 mg by mouth daily.  . Calcium Carbonate-Vitamin D (CALCIUM 600+D) 600-400 MG-UNIT per tablet Take 1 tablet by mouth every evening.  [name] 500 MG CAPS Take by mouth.  . Coenzyme Q10 (CO Q-10 PO) Take by mouth 2 (two) times daily. With red yeast rice [employer] Brand  . COLLAGEN PO Take 1 tablet by mouth daily. This is known as 1-2-3  . Cyanocobalamin (VITAMIN B 12 PO) Take 1,000 mcg by mouth daily.   [name] estradiol (ESTRACE) 1 MG tablet Take 1 mg by mouth every other day.   . Ginkgo Biloba 40 MG TABS Take 120 mg by mouth daily.    [name] glucosamine-chondroitin 500-400 MG tablet Take 1 tablet by mouth 2 (two) times daily.    [name] Hyaluronic Acid-Vitamin C (HYALURONIC ACID PO) Take 80 mg by mouth daily.  [name] MILK THISTLE PO Take 100 mg by mouth daily.  . Misc Natural Products (BUTCHERS BROOM PO) Take by mouth daily.    . MULTIPLE VITAMINS PO Take by mouth daily. In the mornings  . Nutritional Supplements (GRAPESEED EXTRACT PO) Take by mouth. 60 mg 1 tab a day  . OAT BRAN SOLUBLE PO Take 1 tablet by mouth daily.  [name] OVER THE COUNTER MEDICATION daily. Vision Gold Luten 66 mg & Billberry 20 mg  . OVER THE COUNTER MEDICATION Alphalipoic Acid 250 mg daily  . POTASSIUM GLUCONATE PO Take 99 mg by mouth daily. This is Chelated Potassium -Puritans Pride  .  pyridOXINE (VITAMIN B-6) 100 MG tablet Take 100 mg by mouth daily.   . Turmeric 500 MG CAPS Take by mouth. Patient takes  1 in the morning, 1 at lunch and 1 at bedtime.  . Vitamins C E (CRANBERRY CONCENTRATE PO) Take 2,500 mg by mouth daily.   . Zinc 50 MG CAPS Take 25 mg by mouth daily.   . [DISCONTINUED] Calcium Carbonate-Vitamin D (CALCIUM 600+D) 600-400 MG-UNIT per tablet Take 1 tablet by mouth daily. (Patient taking differently: Take 1 tablet by mouth every evening. )  . [DISCONTINUED] aspirin 81 MG tablet Take 81 mg by mouth daily.     No facility-administered encounter medications on file as of 01/23/2015.    Allergies (verified) Influenza virus vacc split pf and Penicillins   History: Past Medical History  Diagnosis Date  . Ulcerative colitis   . Shingles   . Cataract   . H/O hiatal hernia   . Cancer     skin cancer on nose  . Anemia   . Scoliosis   . Osteopenia   . Carpal tunnel syndrome, bilateral   . Shingles    Past Surgical History  Procedure Laterality Date  . Colonoscopy    . Upper gastrointestinal endoscopy    . Bunionectomy  Bilateral  . Hernia      Right inguinal  . Abdominal hysterectomy    . Hernia repair  1961    right inguinal hernia  . Cataract extraction w/phaco  05/28/2012    Procedure: CATARACT EXTRACTION PHACO AND INTRAOCULAR LENS PLACEMENT (IOC);  Surgeon: Tonny Branch, MD;  Location: [facility];  Service: Ophthalmology;  Laterality: Right;  CDE=12.84  . Cataract extraction w/phaco  06/07/2012    Procedure: CATARACT EXTRACTION PHACO AND INTRAOCULAR LENS PLACEMENT (IOC);  Surgeon: Tonny Branch, MD;  Location: [facility];  Service: Ophthalmology;  Laterality: Left;  CDE 17.60   Family History  Problem Relation Age of Onset  . Arthritis Mother   . Prostate cancer Father   . Liver cancer Brother   . Lung cancer Brother   . Heart disease Sister     Rheumatic Fever  . Diabetes Sister   . Colon cancer Sister   . GI problems Sister     diverticulitis  .  Diabetes Sister   . Neuropathy Sister   . COPD Sister    Social History   Occupational History  .     Social History Main Topics  . Smoking status: Never Smoker   . Smokeless tobacco: Never Used     Comment: Never smoker  . Alcohol Use: 0.0 oz/week    0 Standard drinks or equivalent per week     Comment: Very Rarely  . Drug Use: No  . Sexual Activity: Yes    Birth Control/ Protection: Surgical    Do you feel safe at home?  Yes  Dietary issues and exercise activities: Current Exercise Habits:: Home exercise routine, Type of exercise: walking, Time (Minutes): 30, Frequency (Times/Week): 4, Weekly Exercise (Minutes/Week): 120, Intensity: Moderate   Currently doing PT for carpal tunnel syndrome  Current Dietary habits:  Excellent diet  Objective:    Today's Vitals   01/23/15 1039  BP: 138/76  Pulse: 64  Height: 5' (1.524 m)  Weight: 135 lb (61.236 kg)  PainSc: 0-No pain   Body mass index is 26.37 kg/(m^2).  Activities of Daily Living In your present state of health, do you have any difficulty performing the following activities: 01/23/2015 01/23/2015  Hearing? N -  Vision? N -  Difficulty concentrating or making decisions? N N  Walking or climbing stairs? N N  Dressing or bathing? N -  Preparing Food and eating ? N -  Using the Toilet? N -  In the past six months, have you accidently leaked urine? N -  Do you have problems with loss of bowel control? N -  Managing your Medications? N -  Managing your Finances? N -  Housekeeping or managing your Housekeeping? N -    Are there smokers in your home (other than you)? No   Cardiac Risk Factors include: advanced age (>29mn, >>13women)  Depression Screen PHQ 2/9 Scores 01/23/2015 12/05/2014 05/13/2014 11/15/2013  PHQ - 2 Score 1 1 1 1   Patient continues to attend Grief Sharing group on a regular basis.    Fall Risk Fall Risk  12/05/2014 05/13/2014 11/15/2013  Falls in the past year? No No No    Cognitive  Function: MMSE - Mini Mental State Exam 01/23/2015  Orientation to time 5  Orientation to Place 5  Registration 3  Attention/ Calculation 4  Recall 3  Language- name 2 objects 2  Language- repeat 1  Language- follow 3 step command 2  Language- read & follow direction 1  Write  a sentence 1  Copy design 1  Total score 28    Immunizations and Health Maintenance Immunization History  Administered Date(s) Administered  . Pneumococcal Conjugate-13 02/26/2014  . Tdap 01/23/2015   Health Maintenance Due  Topic Date Due  . INFLUENZA VACCINE  12/01/2014    Patient Care Team: Chipper Herb, MD as PCP - General (Family Medicine) Rogene Houston, MD as Consulting Physician (Gastroenterology) Gaynelle Arabian, MD as Consulting Physician (Orthopedic Surgery)  Indicate any recent Medical Services you may have received from other than [facility] providers in the past year (date may be approximate).    Assessment:    Annual Wellness Visit     Screening Tests Health Maintenance  Topic Date Due  . INFLUENZA VACCINE  12/01/2014  . ZOSTAVAX  05/13/2016 (Originally 12/13/1997)  . PNA vac Low Risk Adult (2 of 2 - PPSV23) 02/27/2015  . MAMMOGRAM  10/01/2015  . DEXA SCAN  02/20/2016  . TETANUS/TDAP  01/22/2025        Plan:   During the course of the visit Revia was educated and counseled about the following appropriate screening and preventive services:   Vaccines to include Pneumoccal, Influenza, Hepatitis B, Td, Zostavax - Boostrix administered today.  Zostavax not indicated as patient has had shingles;  Also patient does not usually get yearly influenza vaccine as she has a past history of adverse reaction.  Colorectal cancer screening - UTD  Patient has discontinued ASA due to history of blood in stool in 2015  Cardiovascular disease screening - UTD  Diabetes screening - UTD  Bone Denisty / Osteoporosis Screening - UTD  Mammogram - UTD   Glaucoma screening / Eye Exam -  UTD  Nutrition counseling - continue current diet  Continue to remain active.    Advanced Directives - patient has, copy requeted  Orders Placed This Encounter  Procedures  . Tdap vaccine greater than or equal to 7yo IM    Patient Instructions (the written plan) were given to the patient.   Cherre Robins, [facility]   01/23/2015

## 2015-01-23 NOTE — Patient Instructions (Addendum)
Lori Soto , Thank you for taking time to come for your Medicare Wellness Visit. I appreciate your ongoing commitment to your health goals. Please review the following plan we discussed and let me know if I can assist you in the future.   These are the goals we discussed: Continue to stay active and  Follow a healthy diet  Increase non-starchy vegetables - carrots, green bean, squash, zucchini, tomatoes, onions, peppers, spinach and other green leafy vegetables, cabbage, lettuce, cucumbers, asparagus, okra (not fried), eggplant limit sugar and processed foods (cakes, cookies, ice cream, crackers and chips) Increase fresh fruit but limit serving sizes 1/2 cup or about the size of tennis or baseball limit red meat to no more than 1-2 times per week (serving size about the size of your palm) Choose whole grains / lean proteins - whole wheat bread, quinoa, whole grain rice (1/2 cup), fish, chicken, [place]   This is a list of the screening recommended for you and due dates:  Health Maintenance  Topic Date Due  . Flu Shot  Doesn't get due to past reaction  . Tetanus Vaccine  Due now  . Shingles Vaccine  05/13/2016*  . Pneumonia vaccines (2 of 2 - PPSV23) 02/27/2015  . Mammogram  10/01/2015  . Colon Cancer Screening  05/13/2020  . DEXA scan (bone density measurement)  02/20/2016  *Topic was postponed. The date shown is not the original due date.    Health Maintenance Adopting a healthy lifestyle and getting preventive care can go a long way to promote health and wellness. Talk with your health care provider about what schedule of regular examinations is right for you. This is a good chance for you to check in with your provider about disease prevention and staying healthy. In between checkups, there are plenty of things you can do on your own. Experts have done a lot of research about which lifestyle changes and preventive measures are most likely to keep you healthy. Ask your health care  provider for more information. WEIGHT AND DIET  Eat a healthy diet  Be sure to include plenty of vegetables, fruits, low-fat dairy products, and lean protein.  Do not eat a lot of foods high in solid fats, added sugars, or salt.  Get regular exercise. This is one of the most important things you can do for your health.  Most adults should exercise for at least 150 minutes each week. The exercise should increase your heart rate and make you sweat (moderate-intensity exercise).  Most adults should also do strengthening exercises at least twice a week. This is in addition to the moderate-intensity exercise.  Maintain a healthy weight  Body mass index (BMI) is a measurement that can be used to identify possible weight problems. It estimates body fat based on height and weight. Your health care provider can help determine your BMI and help you achieve or maintain a healthy weight.  For females 106 years of age and older:   A BMI below 18.5 is considered underweight.  A BMI of 18.5 to 24.9 is normal.  A BMI of 25 to 29.9 is considered overweight.  A BMI of 30 and above is considered obese.  Watch levels of cholesterol and blood lipids  You should start having your blood tested for lipids and cholesterol at 77 years of age, then have this test every 5 years.  You may need to have your cholesterol levels checked more often if:  Your lipid or cholesterol levels are high.  You are older than 77 years of age.  You are at high risk for heart disease.  CANCER SCREENING   Lung Cancer  Lung cancer screening is recommended for adults 8-4 years old who are at high risk for lung cancer because of a history of smoking.  A yearly low-dose CT scan of the lungs is recommended for people who:  Currently smoke.  Have quit within the past 15 years.  Have at least a 30-pack-year history of smoking. A pack year is smoking an average of one pack of cigarettes a day for 1 year.  Yearly  screening should continue until it has been 15 years since you quit.  Yearly screening should stop if you develop a health problem that would prevent you from having lung cancer treatment.  Breast Cancer  Practice breast self-awareness. This means understanding how your breasts normally appear and feel.  It also means doing regular breast self-exams. Let your health care provider know about any changes, no matter how small.  If you are in your 20s or 30s, you should have a clinical breast exam (CBE) by a health care provider every 1-3 years as part of a regular health exam.  If you are 32 or older, have a CBE every year. Also consider having a breast X-ray (mammogram) every year.  If you have a family history of breast cancer, talk to your health care provider about genetic screening.  If you are at high risk for breast cancer, talk to your health care provider about having an MRI and a mammogram every year.  Breast cancer gene (BRCA) assessment is recommended for women who have family members with BRCA-related cancers. BRCA-related cancers include:  Breast.  Ovarian.  Tubal.  Peritoneal cancers.  Results of the assessment will determine the need for genetic counseling and BRCA1 and BRCA2 testing. Cervical Cancer Routine pelvic examinations to screen for cervical cancer are no longer recommended for nonpregnant women who are considered low risk for cancer of the pelvic organs (ovaries, uterus, and vagina) and who do not have symptoms. A pelvic examination may be necessary if you have symptoms including those associated with pelvic infections. Ask your health care provider if a screening pelvic exam is right for you.   The Pap test is the screening test for cervical cancer for women who are considered at risk.  If you had a hysterectomy for a problem that was not cancer or a condition that could lead to cancer, then you no longer need Pap tests.  If you are older than 65 years, and  you have had normal Pap tests for the past 10 years, you no longer need to have Pap tests.  If you have had past treatment for cervical cancer or a condition that could lead to cancer, you need Pap tests and screening for cancer for at least 20 years after your treatment.  If you no longer get a Pap test, assess your risk factors if they change (such as having a new sexual partner). This can affect whether you should start being screened again.  Some women have medical problems that increase their chance of getting cervical cancer. If this is the case for you, your health care provider may recommend more frequent screening and Pap tests.  The human papillomavirus (HPV) test is another test that may be used for cervical cancer screening. The HPV test looks for the virus that can cause cell changes in the cervix. The cells collected during the Pap test can  be tested for HPV.  The HPV test can be used to screen women 21 years of age and older. Getting tested for HPV can extend the interval between normal Pap tests from three to five years.  An HPV test also should be used to screen women of any age who have unclear Pap test results.  After 77 years of age, women should have HPV testing as often as Pap tests.  Colorectal Cancer  This type of cancer can be detected and often prevented.  Routine colorectal cancer screening usually begins at 77 years of age and continues through 77 years of age.  Your health care provider may recommend screening at an earlier age if you have risk factors for colon cancer.  Your health care provider may also recommend using home test kits to check for hidden blood in the stool.  A small camera at the end of a tube can be used to examine your colon directly (sigmoidoscopy or colonoscopy). This is done to check for the earliest forms of colorectal cancer.  Routine screening usually begins at age 73.  Direct examination of the colon should be repeated every 5-10  years through 77 years of age. However, you may need to be screened more often if early forms of precancerous polyps or small growths are found. Skin Cancer  Check your skin from head to toe regularly.  Tell your health care provider about any new moles or changes in moles, especially if there is a change in a mole's shape or color.  Also tell your health care provider if you have a mole that is larger than the size of a pencil eraser.  Always use sunscreen. Apply sunscreen liberally and repeatedly throughout the day.  Protect yourself by wearing long sleeves, pants, a wide-brimmed hat, and sunglasses whenever you are outside. HEART DISEASE, DIABETES, AND HIGH BLOOD PRESSURE   Have your blood pressure checked at least every 1-2 years. High blood pressure causes heart disease and increases the risk of stroke.  If you are between 51 years and 53 years old, ask your health care provider if you should take aspirin to prevent strokes.  Have regular diabetes screenings. This involves taking a blood sample to check your fasting blood sugar level.  If you are at a normal weight and have a low risk for diabetes, have this test once every three years after 77 years of age.  If you are overweight and have a high risk for diabetes, consider being tested at a younger age or more often. PREVENTING INFECTION  Hepatitis B  If you have a higher risk for hepatitis B, you should be screened for this virus. You are considered at high risk for hepatitis B if:  You were born in a country where hepatitis B is common. Ask your health care provider which countries are considered high risk.  Your parents were born in a high-risk country, and you have not been immunized against hepatitis B (hepatitis B vaccine).  You have HIV or AIDS.  You use needles to inject street drugs.  You live with someone who has hepatitis B.  You have had sex with someone who has hepatitis B.  You get hemodialysis  treatment.  You take certain medicines for conditions, including cancer, organ transplantation, and autoimmune conditions. Hepatitis C  Blood testing is recommended for:  Everyone born from [birth year] through 1963.  Anyone with known risk factors for hepatitis C. Sexually transmitted infections (STIs)  You should be screened for sexually  transmitted infections (STIs) including gonorrhea and chlamydia if:  You are sexually active and are younger than 77 years of age.  You are older than 77 years of age and your health care provider tells you that you are at risk for this type of infection.  Your sexual activity has changed since you were last screened and you are at an increased risk for chlamydia or gonorrhea. Ask your health care provider if you are at risk.  If you do not have HIV, but are at risk, it may be recommended that you take a prescription medicine daily to prevent HIV infection. This is called pre-exposure prophylaxis (PrEP). You are considered at risk if:  You are sexually active and do not regularly use condoms or know the HIV status of your partner(s).  You take drugs by injection.  You are sexually active with a partner who has HIV. Talk with your health care provider about whether you are at high risk of being infected with HIV. If you choose to begin PrEP, you should first be tested for HIV. You should then be tested every 3 months for as long as you are taking PrEP.  PREGNANCY   If you are premenopausal and you may become pregnant, ask your health care provider about preconception counseling.  If you may become pregnant, take 400 to 800 micrograms (mcg) of folic acid every day.  If you want to prevent pregnancy, talk to your health care provider about birth control (contraception). OSTEOPOROSIS AND MENOPAUSE   Osteoporosis is a disease in which the bones lose minerals and strength with aging. This can result in serious bone fractures. Your risk for osteoporosis can  be identified using a bone density scan.  If you are 84 years of age or older, or if you are at risk for osteoporosis and fractures, ask your health care provider if you should be screened.  Ask your health care provider whether you should take a calcium or vitamin D supplement to lower your risk for osteoporosis.  Menopause may have certain physical symptoms and risks.  Hormone replacement therapy may reduce some of these symptoms and risks. Talk to your health care provider about whether hormone replacement therapy is right for you.  HOME CARE INSTRUCTIONS   Schedule regular health, dental, and eye exams.  Stay current with your immunizations.   Do not use any tobacco products including cigarettes, chewing tobacco, or electronic cigarettes.  If you are pregnant, do not drink alcohol.  If you are breastfeeding, limit how much and how often you drink alcohol.  Limit alcohol intake to no more than 1 drink per day for nonpregnant women. One drink equals 12 ounces of beer, 5 ounces of wine, or 1 ounces of hard liquor.  Do not use street drugs.  Do not share needles.  Ask your health care provider for help if you need support or information about quitting drugs.  Tell your health care provider if you often feel depressed.  Tell your health care provider if you have ever been abused or do not feel safe at home. Document Released: 11/01/2010 Document Revised: 09/02/2013 Document Reviewed: 03/20/2013 [facility] Patient Information 2015 [place], [place]. This information is not intended to replace advice given to you by your health care provider. Make sure you discuss any questions you have with your health care provider.

## 2015-01-26 DIAGNOSIS — G5602 Carpal tunnel syndrome, left upper limb: Secondary | ICD-10-CM | POA: Diagnosis not present

## 2015-01-26 DIAGNOSIS — G5601 Carpal tunnel syndrome, right upper limb: Secondary | ICD-10-CM | POA: Diagnosis not present

## 2015-01-26 DIAGNOSIS — M1811 Unilateral primary osteoarthritis of first carpometacarpal joint, right hand: Secondary | ICD-10-CM | POA: Diagnosis not present

## 2015-01-26 DIAGNOSIS — M47812 Spondylosis without myelopathy or radiculopathy, cervical region: Secondary | ICD-10-CM | POA: Diagnosis not present

## 2015-01-26 DIAGNOSIS — M150 Primary generalized (osteo)arthritis: Secondary | ICD-10-CM | POA: Diagnosis not present

## 2015-01-26 DIAGNOSIS — M1812 Unilateral primary osteoarthritis of first carpometacarpal joint, left hand: Secondary | ICD-10-CM | POA: Diagnosis not present

## 2015-01-29 ENCOUNTER — Encounter: Payer: Self-pay | Admitting: Physical Therapy

## 2015-01-29 ENCOUNTER — Ambulatory Visit: Payer: Medicare Other | Admitting: Physical Therapy

## 2015-01-29 DIAGNOSIS — M545 Low back pain, unspecified: Secondary | ICD-10-CM

## 2015-01-29 DIAGNOSIS — M546 Pain in thoracic spine: Secondary | ICD-10-CM | POA: Diagnosis not present

## 2015-01-29 NOTE — Therapy (Signed)
[facility] [place], [place], [address] Phone: [phone]   Fax:  [phone]  Physical Therapy Treatment  Patient Details  Name: Lori Soto MRN: [id number] Date of Birth: [birth year] Referring Provider:  Chipper Herb, MD  Encounter Date: 01/29/2015      PT End of Session - 01/29/15 1414    Visit Number 5   Number of Visits 12   Date for PT Re-Evaluation 02/11/15   PT Start Time 3149   PT Stop Time 1436   PT Time Calculation (min) 37 min   Activity Tolerance Patient tolerated treatment well   Behavior During Therapy [facility] for tasks assessed/performed      Past Medical History  Diagnosis Date  . Ulcerative colitis   . Shingles   . Cataract   . H/O hiatal hernia   . Cancer     skin cancer on nose  . Anemia   . Scoliosis   . Osteopenia   . Carpal tunnel syndrome, bilateral   . Shingles     Past Surgical History  Procedure Laterality Date  . Colonoscopy    . Upper gastrointestinal endoscopy    . Bunionectomy      Bilateral  . Hernia      Right inguinal  . Abdominal hysterectomy    . Hernia repair  1961    right inguinal hernia  . Cataract extraction w/phaco  05/28/2012    Procedure: CATARACT EXTRACTION PHACO AND INTRAOCULAR LENS PLACEMENT (IOC);  Surgeon: Tonny Branch, MD;  Location: [facility];  Service: Ophthalmology;  Laterality: Right;  CDE=12.84  . Cataract extraction w/phaco  06/07/2012    Procedure: CATARACT EXTRACTION PHACO AND INTRAOCULAR LENS PLACEMENT (IOC);  Surgeon: Tonny Branch, MD;  Location: [facility];  Service: Ophthalmology;  Laterality: Left;  CDE 17.60    There were no vitals filed for this visit.  Visit Diagnosis:  Bilateral low back pain without sciatica  Bilateral thoracic back pain      Subjective Assessment - 01/29/15 1407    Subjective patient reported no pain upon arrival yet pain increase up to 8+/1- with activities   Limitations Standing   How long can you stand  comfortably? 10 to 15 minutes.   Currently in Pain? No/denies                         [facility] Adult PT Treatment/Exercise - 01/29/15 0001    Lumbar Exercises: Standing   Other Standing Lumbar Exercises seated for scap retractions with core activation and red t-band 2x10   Lumbar Exercises: Supine   Bent Knee Raise 5 seconds  2x20   Bridge 3 seconds  2x10   Straight Leg Raise 5 seconds  2x20   Traction   Type of Traction Lumbar   Min (lbs) 5   Max (lbs) 75   Hold Time 99   Rest Time 5   Time 15                  PT Short Term Goals - 01/15/15 1055    PT SHORT TERM GOAL #1   Title Ind with a HEP.   Time 2   Period Weeks   Status Achieved           PT Long Term Goals - 01/29/15 1405    PT LONG TERM GOAL #1   Title Perform ADL's with pain not > 3/10.   Time 6   Period Weeks  Status On-going  8/10   PT LONG TERM GOAL #2   Title Stand 20 minutes with pain not > 3-4/10.   Time 6   Period Weeks   Status On-going  8-9/10               Plan - 01/29/15 1423    Clinical Impression Statement Patient tolerated treatment well today and was able to tolerate continued core activation exercises with no pain. Increased lumbar traction to 75# per MPT today with goood response to increase per patient. Patient had no pain pre or post treatment and no antalgic gait. Goals ongoing due to continued reports of pain increase in back with prolong standing or walking.   Pt will benefit from skilled therapeutic intervention in order to improve on the following deficits Pain;Decreased activity tolerance   Rehab Potential Good   Clinical Impairments Affecting Rehab Potential patient weight 135#   PT Frequency 2x / week   PT Duration 6 weeks   PT Treatment/Interventions Traction;Moist Heat;Therapeutic exercise;Manual techniques   PT Next Visit Plan cont with POC   Consulted and Agree with Plan of Care Patient        Problem List Patient Active  Problem List   Diagnosis Date Noted  . Osteopenia of the elderly 02/19/2014  . DDD (degenerative disc disease), lumbar 12/03/2013  . Scoliosis 12/03/2013  . Chronic LBP 05/14/2013  . IBS (irritable bowel syndrome) 11/08/2012  . UC (ulcerative colitis) 04/23/2012  . Palpitation 10/26/2011  . Hypertension 10/26/2011  . Chronic ulcerative colitis 03/23/2011    Phillips Climes, PTA 01/29/2015, 2:39 PM  [facility] [address] [place], [place], [address] Phone: [phone]   Fax:  [phone]

## 2015-02-05 ENCOUNTER — Ambulatory Visit: Payer: Medicare Other | Attending: Family Medicine | Admitting: Physical Therapy

## 2015-02-05 DIAGNOSIS — M546 Pain in thoracic spine: Secondary | ICD-10-CM | POA: Diagnosis not present

## 2015-02-05 DIAGNOSIS — M545 Low back pain, unspecified: Secondary | ICD-10-CM

## 2015-02-05 NOTE — Patient Instructions (Signed)
Lower abdominal/core stability exercises  1. Practice your breathing technique: Inhale through your nose expanding your belly and rib cage. Try not to breathe into your chest. Exhale slowly and gradually out your mouth feeling a sense of softness to your body. Practice multiple times. This can be performed unlimited.  2. Finding the lower abdominals. Laying on your back with the knees bent, place your fingers just below your belly button. Using your breathing technique from above, on your exhale gently pull the belly button away from your fingertips without tensing any other muscles. Practice this 5x. Next, as you exhale, draw belly button inwards and hold onto it...then feel as if you are pulling that muscle across your pelvis like you are tightening a belt. This can be hard to do at first so be patient and practice. Do 5-10 reps 1-3 x day. Always recognize quality over quantity; if your abdominal muscles become tired you will notice you may tighten/contract other muscles. This is the time to take a break.   Practice this first laying on your back, then in sitting, progressing to standing and finally adding it to all your daily movements.   3. Finding your pelvic floor. Using the breathing technique above, when your exhale, this time draw your pelvic floor muscles up as if you were attempting to stop the flow of urination. Be careful NOT to tense any other muscles. This can be hard, BE PATIENT. Try to hold up to 10 seconds repeating 10x. Try 2x a day. Once you feel you are doing this well, add this contraction to exercise #2. First contracting your pelvic floor followed by lower abdominals.   4. Adding leg movements. Add the following leg movements to challenge your ability to keep your core stable:  1. Single leg drop outs: Laying on your back with knees bent feet flat. Inhale,  dropping one knee outward KEEPING YOUR PELVIS STILL. Exhale as you bring the leg back, simultaneously performing your lower  abdominal contraction. Do 5-10 on each leg.   2. Marching: While keeping your pelvis still, lift the right foot a few inches, put it down then lift left foot. This will mimic a march. Start slow to establish control. Once you have control you may speed it up. Do 10-20x. You MUST keep your lower abdominlas contracted while you march. Breathe naturally    3. Single leg slides: Inhale while you slowly slide one leg out keeping your pelvis still. Only slide your leg as far as you can keep your pelvis still. Exhale as you bring the leg back to the start, contracting the lower abdominals as you do that. Keep your upper body relaxed. Do 5-10 on each side.      Madelyn Flavors, PT 02/05/2015 2:57 PM [facility] [place], [place], [address] Phone: [phone]   Fax:  [phone]

## 2015-02-05 NOTE — Therapy (Signed)
[facility] [place], [place], [address] Phone: [phone]   Fax:  [phone]  Physical Therapy Treatment  Patient Details  Name: Lori Soto MRN: [id number] Date of Birth: July 03, 1935 Referring Provider:  Chipper Herb, MD  Encounter Date: 02/05/2015      PT End of Session - 02/05/15 1434    Visit Number 6   Number of Visits 12   Date for PT Re-Evaluation 02/11/15   PT Start Time 1572   PT Stop Time 1533   PT Time Calculation (min) 59 min   Activity Tolerance Patient tolerated treatment well   Behavior During Therapy [facility] for tasks assessed/performed      Past Medical History  Diagnosis Date  . Ulcerative colitis   . Shingles   . Cataract   . H/O hiatal hernia   . Cancer     skin cancer on nose  . Anemia   . Scoliosis   . Osteopenia   . Carpal tunnel syndrome, bilateral   . Shingles     Past Surgical History  Procedure Laterality Date  . Colonoscopy    . Upper gastrointestinal endoscopy    . Bunionectomy      Bilateral  . Hernia      Right inguinal  . Abdominal hysterectomy    . Hernia repair  1961    right inguinal hernia  . Cataract extraction w/phaco  05/28/2012    Procedure: CATARACT EXTRACTION PHACO AND INTRAOCULAR LENS PLACEMENT (IOC);  Surgeon: Tonny Branch, MD;  Location: [facility];  Service: Ophthalmology;  Laterality: Right;  CDE=12.84  . Cataract extraction w/phaco  06/07/2012    Procedure: CATARACT EXTRACTION PHACO AND INTRAOCULAR LENS PLACEMENT (IOC);  Surgeon: Tonny Branch, MD;  Location: [facility];  Service: Ophthalmology;  Laterality: Left;  CDE 17.60    There were no vitals filed for this visit.  Visit Diagnosis:  Bilateral low back pain without sciatica  Bilateral thoracic back pain      Subjective Assessment - 02/05/15 1436    Subjective The relief lasts for about one week with traction. I feel I'm doing better.   Currently in Pain? Yes   Pain Score 5    Pain Location Back   Pain Orientation Right   Pain Descriptors / Indicators Aching;Sore   Pain Type Chronic pain   Pain Frequency Constant   Aggravating Factors  pronlonged standing   Pain Relieving Factors traction   Effect of Pain on Daily Activities painful                         [facility] Adult PT Treatment/Exercise - 02/05/15 0001    Lumbar Exercises: Stretches   Standing Side Bend 2 reps;20 seconds  seated   Quadruped Mid Back Stretch 2 reps;20 seconds  seated   Lumbar Exercises: Standing   Other Standing Lumbar Exercises yoga traction at sink   Lumbar Exercises: Supine   Ab Set 5 reps;5 seconds  with pelvic floor cues   AB Set Limitations patient unable to correctly engage TA without pelvic floor   Clam 5 reps  ea side with ab set   Bent Knee Raise 10 reps  Bil with ab set   Bridge 20 reps  2x10   Straight Leg Raise 10 reps  bil with ab set   Other Supine Lumbar Exercises 2# ball to alt knee in hooklying x 10 ea   Lumbar Exercises: Prone   Other Prone Lumbar  Exercises pelvic press series 5sec hold x 5, with knee flex x5ea bil, with B knee flex x 5, with hip ext x 5 ea   Traction   Type of Traction Lumbar   Min (lbs) 5   Max (lbs) 75   Hold Time 99   Rest Time 5   Time 15                PT Education - 02/05/15 1525    Education provided Yes   Education Details HEP: self traction; transverse abdominus    Person(s) Educated Patient   Methods Explanation;Demonstration;Handout   Comprehension Verbalized understanding;Returned demonstration          PT Short Term Goals - 01/15/15 1055    PT SHORT TERM GOAL #1   Title Ind with a HEP.   Time 2   Period Weeks   Status Achieved           PT Long Term Goals - 02/05/15 1437    PT LONG TERM GOAL #1   Title Perform ADL's with pain not > 3/10.   Time 6   Period Weeks   Status On-going   PT LONG TERM GOAL #2   Title Stand 45 minutes with pain not > 3-4/10.  8/10 yesterday after 45 min   Time 6    Period Weeks   Status Revised  from 20 min per pt request               Plan - 02/05/15 1528    Clinical Impression Statement patient continues to get relief from traction. She was able to stand for 45 min yesterday peeling apples but pain increased to 8/10. Went throught various back stretches with good response.   Pt will benefit from skilled therapeutic intervention in order to improve on the following deficits Pain;Decreased activity tolerance   Rehab Potential Good   Clinical Impairments Affecting Rehab Potential patient weight 135#   PT Frequency 2x / week   PT Duration 6 weeks   PT Treatment/Interventions Traction;Moist Heat;Therapeutic exercise;Manual techniques   PT Next Visit Plan cont with POC        Problem List Patient Active Problem List   Diagnosis Date Noted  . Osteopenia of the elderly 02/19/2014  . DDD (degenerative disc disease), lumbar 12/03/2013  . Scoliosis 12/03/2013  . Chronic LBP 05/14/2013  . IBS (irritable bowel syndrome) 11/08/2012  . UC (ulcerative colitis) ([place]) 04/23/2012  . Palpitation 10/26/2011  . Hypertension 10/26/2011  . Chronic ulcerative colitis ([place]) 03/23/2011   Madelyn Flavors PT  02/05/2015, 3:32 PM  [facility] [address] [place], [place], [address] Phone: [phone]   Fax:  [phone]

## 2015-02-12 ENCOUNTER — Ambulatory Visit: Payer: Medicare Other | Admitting: *Deleted

## 2015-02-12 ENCOUNTER — Encounter: Payer: Self-pay | Admitting: *Deleted

## 2015-02-12 DIAGNOSIS — M546 Pain in thoracic spine: Secondary | ICD-10-CM | POA: Diagnosis not present

## 2015-02-12 DIAGNOSIS — M545 Low back pain, unspecified: Secondary | ICD-10-CM

## 2015-02-12 NOTE — Therapy (Signed)
[facility] [place], [place], [address] Phone: [phone]   Fax:  [phone]  Physical Therapy Treatment  Patient Details  Name: Lori Soto MRN: [id number] Date of Birth: [birth year] Referring Provider:  Chipper Herb, MD  Encounter Date: 02/12/2015      PT End of Session - 02/12/15 1519    Visit Number 7   Number of Visits 12   Date for PT Re-Evaluation 02/11/15   PT Start Time 1435   PT Stop Time 1515   PT Time Calculation (min) 40 min      Past Medical History  Diagnosis Date  . Ulcerative colitis   . Shingles   . Cataract   . H/O hiatal hernia   . Cancer ([place])     skin cancer on nose  . Anemia   . Scoliosis   . Osteopenia   . Carpal tunnel syndrome, bilateral   . Shingles     Past Surgical History  Procedure Laterality Date  . Colonoscopy    . Upper gastrointestinal endoscopy    . Bunionectomy      Bilateral  . Hernia      Right inguinal  . Abdominal hysterectomy    . Hernia repair  1961    right inguinal hernia  . Cataract extraction w/phaco  05/28/2012    Procedure: CATARACT EXTRACTION PHACO AND INTRAOCULAR LENS PLACEMENT (IOC);  Surgeon: Tonny Branch, MD;  Location: [facility];  Service: Ophthalmology;  Laterality: Right;  CDE=12.84  . Cataract extraction w/phaco  06/07/2012    Procedure: CATARACT EXTRACTION PHACO AND INTRAOCULAR LENS PLACEMENT (IOC);  Surgeon: Tonny Branch, MD;  Location: [facility];  Service: Ophthalmology;  Laterality: Left;  CDE 17.60    There were no vitals filed for this visit.  Visit Diagnosis:  Bilateral low back pain without sciatica  Bilateral thoracic back pain      Subjective Assessment - 02/12/15 1449    Subjective The relief lasts for about one week with traction. I feel I'm doing better.   Limitations Standing   How long can you stand comfortably? 10 to 15 minutes.   Currently in Pain? Yes   Pain Score 5    Pain Location Back   Pain Orientation Right   Pain Descriptors / Indicators Aching;Sore   Pain Type Chronic pain   Pain Onset More than a month ago   Pain Frequency Constant   Aggravating Factors  prolonged standing   Pain Relieving Factors traction                         [facility] Adult PT Treatment/Exercise - 02/12/15 0001    Lumbar Exercises: Stretches   Quadruped Mid Back Stretch --  seated   Lumbar Exercises: Standing   Other Standing Lumbar Exercises yoga traction at sink   Lumbar Exercises: Prone   Other Prone Lumbar Exercises pelvic press series 5sec hold x 5, with knee flex x5ea bil, with B knee flex x 5, with hip ext x 5 ea   Traction   Type of Traction Lumbar   Min (lbs) 5   Max (lbs) 75   Hold Time 99   Rest Time 5   Time 15                  PT Short Term Goals - 01/15/15 1055    PT SHORT TERM GOAL #1   Title Ind with a HEP.   Time 2  Period Weeks   Status Achieved           PT Long Term Goals - 02/12/15 1520    PT LONG TERM GOAL #1   Title Perform ADL's with pain not > 3/10.   Time 6   Period Weeks   Status On-going   PT LONG TERM GOAL #2   Title Stand 45 minutes with pain not > 3-4/10.   Time 6   Period Weeks   Status On-going               Plan - 02/12/15 1523    Clinical Impression Statement Pt did well with Rx today and can tell that Traction and exs are helping. She was not able to meet goals yet due to high pain levels7-8/10 with ADLs and standing longere than 45 mins to an hour   Pt will benefit from skilled therapeutic intervention in order to improve on the following deficits Pain;Decreased activity tolerance   Clinical Impairments Affecting Rehab Potential patient weight 135#   PT Frequency 2x / week   PT Duration 6 weeks   PT Treatment/Interventions Traction;Moist Heat;Therapeutic exercise;Manual techniques   PT Next Visit Plan cont with POC.  MC recert   Consulted and Agree with Plan of Care Patient        Problem List Patient Active  Problem List   Diagnosis Date Noted  . Osteopenia of the elderly 02/19/2014  . DDD (degenerative disc disease), lumbar 12/03/2013  . Scoliosis 12/03/2013  . Chronic LBP 05/14/2013  . IBS (irritable bowel syndrome) 11/08/2012  . UC (ulcerative colitis) ([employer]) 04/23/2012  . Palpitation 10/26/2011  . Hypertension 10/26/2011  . Chronic ulcerative colitis ([place]) 03/23/2011    Sophy Mesler,CHRIS, PTA 02/12/2015, 3:34 PM  [facility] [address] [place], [place], [address] Phone: [phone]   Fax:  [phone]

## 2015-02-19 ENCOUNTER — Other Ambulatory Visit: Payer: Self-pay | Admitting: Orthopedic Surgery

## 2015-02-20 ENCOUNTER — Encounter: Payer: Self-pay | Admitting: Physical Therapy

## 2015-02-20 ENCOUNTER — Ambulatory Visit: Payer: Medicare Other | Admitting: Physical Therapy

## 2015-02-20 DIAGNOSIS — M546 Pain in thoracic spine: Secondary | ICD-10-CM | POA: Diagnosis not present

## 2015-02-20 DIAGNOSIS — M545 Low back pain, unspecified: Secondary | ICD-10-CM

## 2015-02-20 NOTE — Therapy (Signed)
[facility] [place], [place], [address] Phone: [phone]   Fax:  [phone]  Physical Therapy Treatment  Patient Details  Name: Lori Soto MRN: [id number] Date of Birth: Sep 27, 1935 Referring Provider: Dr. Laurance Flatten Dr. Brooks/ Dr. Nelva Bush  Encounter Date: 02/20/2015      PT End of Session - 02/20/15 0950    Visit Number 8   Number of Visits 12   Date for PT Re-Evaluation 02/11/15   PT Start Time 0950   PT Stop Time 1042   PT Time Calculation (min) 52 min   Activity Tolerance Patient tolerated treatment well   Behavior During Therapy [facility] for tasks assessed/performed      Past Medical History  Diagnosis Date  . Ulcerative colitis   . Shingles   . Cataract   . H/O hiatal hernia   . Cancer ([place])     skin cancer on nose  . Anemia   . Scoliosis   . Osteopenia   . Carpal tunnel syndrome, bilateral   . Shingles     Past Surgical History  Procedure Laterality Date  . Colonoscopy    . Upper gastrointestinal endoscopy    . Bunionectomy      Bilateral  . Hernia      Right inguinal  . Abdominal hysterectomy    . Hernia repair  1961    right inguinal hernia  . Cataract extraction w/phaco  05/28/2012    Procedure: CATARACT EXTRACTION PHACO AND INTRAOCULAR LENS PLACEMENT (IOC);  Surgeon: Tonny Branch, MD;  Location: [facility];  Service: Ophthalmology;  Laterality: Right;  CDE=12.84  . Cataract extraction w/phaco  06/07/2012    Procedure: CATARACT EXTRACTION PHACO AND INTRAOCULAR LENS PLACEMENT (IOC);  Surgeon: Tonny Branch, MD;  Location: [facility];  Service: Ophthalmology;  Laterality: Left;  CDE 17.60    There were no vitals filed for this visit.  Visit Diagnosis:  Bilateral low back pain without sciatica  Bilateral thoracic back pain      Subjective Assessment - 02/20/15 0949    Subjective Has had a long week of running and standing. States that she tried to sit whenever possible.   Limitations Standing   How  long can you stand comfortably? 10 to 15 minutes.   Currently in Pain? Yes   Pain Score 8    Pain Location Back   Pain Orientation Right;Mid;Lower   Pain Descriptors / Indicators Sharp;Nagging;Sore   Pain Type Chronic pain   Pain Onset More than a month ago            [facility] PT Assessment - 02/20/15 0001    Assessment   Medical Diagnosis Back pain.   Referring Provider Dr. Laurance Flatten Dr. Brooks/ Dr. Paulette Blanch Adult PT Treatment/Exercise - 02/20/15 0001    Lumbar Exercises: Stretches   Active Hamstring Stretch 2 reps;30 seconds  RLE   Single Knee to Chest Stretch 3 reps;30 seconds;Other (comment)  Bilaterally   Double Knee to Chest Stretch 3 reps;30 seconds   Lumbar Exercises: Supine   Ab Set 20 reps;5 seconds   Clam 20 reps   Bent Knee Raise 20 reps   Bridge 20 reps   Straight Leg Raise 20 reps;Other (comment)  Bilaterally   Other Supine Lumbar Exercises 2# ball to lap in hooklying x 20 ea   Modalities   Modalities Traction   Traction  Type of Traction Lumbar   Min (lbs) 5   Max (lbs) 75   Hold Time 99   Rest Time 5   Time 15                  PT Short Term Goals - 01/15/15 1055    PT SHORT TERM GOAL #1   Title Ind with a HEP.   Time 2   Period Weeks   Status Achieved           PT Long Term Goals - 02/12/15 1520    PT LONG TERM GOAL #1   Title Perform ADL's with pain not > 3/10.   Time 6   Period Weeks   Status On-going   PT LONG TERM GOAL #2   Title Stand 45 minutes with pain not > 3-4/10.   Time 6   Period Weeks   Status On-going               Plan - 02/20/15 1036    Clinical Impression Statement Patient did very well today during her treatment and had no complaints of increased low back pain during today's treatment. Completed all exercises as directed with minimal multimodal cueing for correct exercise technique. Normal traction response noted following end of the traction session. LT goals  remain on-going secondary to pain experienced with ADLs and with prolonged standing. Experienced feeling "better" following today's treatment.   Pt will benefit from skilled therapeutic intervention in order to improve on the following deficits Pain;Decreased activity tolerance   Rehab Potential Good   Clinical Impairments Affecting Rehab Potential patient weight 135#   PT Frequency 2x / week   PT Duration 6 weeks   PT Treatment/Interventions Traction;Moist Heat;Therapeutic exercise;Manual techniques   PT Next Visit Plan cont with POC.  MC recert   Consulted and Agree with Plan of Care Patient        Problem List Patient Active Problem List   Diagnosis Date Noted  . Osteopenia of the elderly 02/19/2014  . DDD (degenerative disc disease), lumbar 12/03/2013  . Scoliosis 12/03/2013  . Chronic LBP 05/14/2013  . IBS (irritable bowel syndrome) 11/08/2012  . UC (ulcerative colitis) ([facility]) 04/23/2012  . Palpitation 10/26/2011  . Hypertension 10/26/2011  . Chronic ulcerative colitis ([place]) 03/23/2011    Wynelle Fanny, PTA 02/20/2015, 10:47 AM  [facility] [address] [place], [place], [address] Phone: [phone]   Fax:  [phone]  Name: Lori Soto MRN: [id number] Date of Birth: June 17, 1935

## 2015-02-26 ENCOUNTER — Encounter: Payer: Self-pay | Admitting: Physical Therapy

## 2015-02-26 ENCOUNTER — Ambulatory Visit: Payer: Medicare Other | Admitting: Physical Therapy

## 2015-02-26 DIAGNOSIS — M545 Low back pain, unspecified: Secondary | ICD-10-CM

## 2015-02-26 DIAGNOSIS — M546 Pain in thoracic spine: Secondary | ICD-10-CM | POA: Diagnosis not present

## 2015-02-26 NOTE — Therapy (Signed)
[facility] [place], [place], [address] Phone: [phone]   Fax:  [phone]  Physical Therapy Treatment  Patient Details  Name: Lori Soto MRN: [id number] Date of Birth: 1935/10/06 Referring Provider: Dr. Laurance Flatten Dr. Brooks/ Dr. Nelva Bush  Encounter Date: 02/26/2015      PT End of Session - 02/26/15 1520    Visit Number 9   Number of Visits 12   Date for PT Re-Evaluation 02/11/15   PT Start Time 1519   PT Stop Time 1607   PT Time Calculation (min) 48 min   Activity Tolerance Patient tolerated treatment well   Behavior During Therapy [facility] for tasks assessed/performed      Past Medical History  Diagnosis Date  . Ulcerative colitis   . Shingles   . Cataract   . H/O hiatal hernia   . Cancer ([place])     skin cancer on nose  . Anemia   . Scoliosis   . Osteopenia   . Carpal tunnel syndrome, bilateral   . Shingles     Past Surgical History  Procedure Laterality Date  . Colonoscopy    . Upper gastrointestinal endoscopy    . Bunionectomy      Bilateral  . Hernia      Right inguinal  . Abdominal hysterectomy    . Hernia repair  1961    right inguinal hernia  . Cataract extraction w/phaco  05/28/2012    Procedure: CATARACT EXTRACTION PHACO AND INTRAOCULAR LENS PLACEMENT (IOC);  Surgeon: Tonny Branch, MD;  Location: [facility];  Service: Ophthalmology;  Laterality: Right;  CDE=12.84  . Cataract extraction w/phaco  06/07/2012    Procedure: CATARACT EXTRACTION PHACO AND INTRAOCULAR LENS PLACEMENT (IOC);  Surgeon: Tonny Branch, MD;  Location: [facility];  Service: Ophthalmology;  Laterality: Left;  CDE 17.60    There were no vitals filed for this visit.  Visit Diagnosis:  Bilateral low back pain without sciatica  Bilateral thoracic back pain      Subjective Assessment - 02/26/15 1519    Subjective Reports she did a lot of standing Wednesday and Thursday and had increased pain Wednesday. Has done mostly sitting work today  and back has been better today.   Limitations Standing   How long can you stand comfortably? 30 minutes   Currently in Pain? Yes   Pain Score 4    Pain Location Back   Pain Orientation Right;Mid;Lower   Pain Type Chronic pain   Pain Onset More than a month ago            [facility] PT Assessment - 02/26/15 0001    Assessment   Medical Diagnosis Back pain.                     [facility] Adult PT Treatment/Exercise - 02/26/15 0001    Lumbar Exercises: Stretches   Active Hamstring Stretch 3 reps;30 seconds;Other (comment)  BLE   Single Knee to Chest Stretch 3 reps;30 seconds  Bilaterally   Double Knee to Chest Stretch 3 reps;30 seconds   Lumbar Exercises: Supine   Ab Set 20 reps;5 seconds   Clam Other (comment)  x25 reps   Bent Knee Raise 20 reps   Bridge Other (comment)  x25 reps   Straight Leg Raise Other (comment)  x25 reps bilaterally   Other Supine Lumbar Exercises 2# ball to lap in hooklying x 20 ea   Other Supine Lumbar Exercises D2 2# ball with core activation x20  reps each side   Modalities   Modalities Traction   Traction   Type of Traction Lumbar   Min (lbs) 5   Max (lbs) 80  Increased per MPT   Hold Time 99   Rest Time 5   Time 15                  PT Short Term Goals - 01/15/15 1055    PT SHORT TERM GOAL #1   Title Ind with a HEP.   Time 2   Period Weeks   Status Achieved           PT Long Term Goals - 02/12/15 1520    PT LONG TERM GOAL #1   Title Perform ADL's with pain not > 3/10.   Time 6   Period Weeks   Status On-going   PT LONG TERM GOAL #2   Title Stand 45 minutes with pain not > 3-4/10.   Time 6   Period Weeks   Status On-going               Plan - 02/26/15 1611    Clinical Impression Statement Patient did very well during today's treatment with no complaint of increased pain during exercises. Experiences ease with DKTC stretch in low back. Completed all exercises as directed with core activation and  minimal multimodal cueing for correct exercise technique. Normal traction response noted following end of the traction session. LT goals remain on-going at this time secondary to increase in pain after 101mn and inability to complete ADLs with pain less than 3/10. Experienced low back tenderness but no pain following today's treatments.   Pt will benefit from skilled therapeutic intervention in order to improve on the following deficits Pain;Decreased activity tolerance   Rehab Potential Good   Clinical Impairments Affecting Rehab Potential patient weight 135#   PT Frequency 2x / week   PT Duration 6 weeks   PT Treatment/Interventions Traction;Moist Heat;Therapeutic exercise;Manual techniques   PT Next Visit Plan cont with POC.  MC recert   Consulted and Agree with Plan of Care Patient        Problem List Patient Active Problem List   Diagnosis Date Noted  . Osteopenia of the elderly 02/19/2014  . DDD (degenerative disc disease), lumbar 12/03/2013  . Scoliosis 12/03/2013  . Chronic LBP 05/14/2013  . IBS (irritable bowel syndrome) 11/08/2012  . [place] 04/[place]  . Palpitation 10/26/2011  . Hypertension 10/26/2011  . Chronic ulcerative colitis ([place] 03/23/2011    KWynelle Fanny PTA 02/26/2015, 4:23 PM  [facility] [address] [address]Phone: [phone]  Fax:  [phone] Name: BPeggie HornakMRN: [id number] of Birth: 804-Apr-1937

## 2015-03-02 ENCOUNTER — Telehealth: Payer: Self-pay | Admitting: Family Medicine

## 2015-03-04 ENCOUNTER — Encounter (HOSPITAL_BASED_OUTPATIENT_CLINIC_OR_DEPARTMENT_OTHER): Payer: Self-pay | Admitting: *Deleted

## 2015-03-06 ENCOUNTER — Ambulatory Visit: Payer: Medicare Other | Attending: Family Medicine | Admitting: Physical Therapy

## 2015-03-06 DIAGNOSIS — M546 Pain in thoracic spine: Secondary | ICD-10-CM | POA: Insufficient documentation

## 2015-03-06 DIAGNOSIS — M545 Low back pain, unspecified: Secondary | ICD-10-CM

## 2015-03-06 NOTE — Therapy (Signed)
[facility] [place], [place], [address] Phone: [phone]   Fax:  [phone]  Physical Therapy Treatment  Patient Details  Name: Lori Soto MRN: [id number] Date of Birth: Apr 08, 1936 Referring Provider: Dr. Laurance Flatten Dr. Brooks/ Dr. Nelva Bush  Encounter Date: 03-26-2015      PT End of Session - Mar 26, 2015 1200    Visit Number 10   Number of Visits 12   Date for PT Re-Evaluation 02/11/15   PT Start Time 1030   PT Stop Time 1120   PT Time Calculation (min) 50 min   Activity Tolerance Patient tolerated treatment well   Behavior During Therapy [facility] for tasks assessed/performed      Past Medical History  Diagnosis Date  . Ulcerative colitis   . Shingles   . Cataract   . H/O hiatal hernia   . Cancer ([place])     skin cancer on nose  . Anemia   . Scoliosis   . Osteopenia   . Carpal tunnel syndrome, bilateral   . Shingles     Past Surgical History  Procedure Laterality Date  . Colonoscopy    . Upper gastrointestinal endoscopy    . Bunionectomy      Bilateral  . Hernia      Right inguinal  . Abdominal hysterectomy    . Hernia repair  1961    right inguinal hernia  . Cataract extraction w/phaco  05/28/2012    Procedure: CATARACT EXTRACTION PHACO AND INTRAOCULAR LENS PLACEMENT (IOC);  Surgeon: Tonny Branch, MD;  Location: [facility];  Service: Ophthalmology;  Laterality: Right;  CDE=12.84  . Cataract extraction w/phaco  06/07/2012    Procedure: CATARACT EXTRACTION PHACO AND INTRAOCULAR LENS PLACEMENT (IOC);  Surgeon: Tonny Branch, MD;  Location: [facility];  Service: Ophthalmology;  Laterality: Left;  CDE 17.60    There were no vitals filed for this visit.  Visit Diagnosis:  Bilateral low back pain without sciatica  Bilateral thoracic back pain                                 PT Short Term Goals - 01/15/15 1055    PT SHORT TERM GOAL #1   Title Ind with a HEP.   Time 2   Period Weeks   Status  Achieved           PT Long Term Goals - 02/12/15 1520    PT LONG TERM GOAL #1   Title Perform ADL's with pain not > 3/10.   Time 6   Period Weeks   Status On-going   PT LONG TERM GOAL #2   Title Stand 45 minutes with pain not > 3-4/10.   Time 6   Period Weeks   Status On-going               Plan - Mar 26, 2015 1215    PT Next Visit Plan Max traction at 85#.          G-Codes - 03-26-15 1201    Functional Assessment Tool Used Clinical judgement.   Functional Limitation Mobility: Walking and moving around   Mobility: Walking and Moving Around Current Status [id number]) At least 20 percent but less than 40 percent impaired, limited or restricted   Mobility: Walking and Moving Around Goal Status [phone]) At least 20 percent but less than 40 percent impaired, limited or restricted      Problem List Patient Active Problem  List   Diagnosis Date Noted  . Osteopenia of the elderly 02/19/2014  . DDD (degenerative disc disease), lumbar 12/03/2013  . Scoliosis 12/03/2013  . Chronic LBP 05/14/2013  . IBS (irritable bowel syndrome) 11/08/2012  . UC (ulcerative colitis) ([place]) 04/23/2012  . Palpitation 10/26/2011  . Hypertension 10/26/2011  . Chronic ulcerative colitis ([place]) 03/23/2011   Treatment:  Left SDLY position with pillow between knees for comfort:  Patient received U/S at 1.50 W/CM2 x 12 minutes to patient's right lumbar musculature CC today) f/b STW/M x 12 minutes f/b int traction at 82# (99 sec on and 5 sec off) x 15 minutes.  Patient tolerated treatment without complaint.  Shen[place]ah Yeats, [place] MPT 03/06/2015, 12:16 PM  [facility] [address] [place], [place], [address] Phone: [phone]   Fax:  [phone]  Name: Lori Soto MRN: [id number] Date of Birth: [birth year]

## 2015-03-09 NOTE — Telephone Encounter (Signed)
error 

## 2015-03-10 ENCOUNTER — Encounter (HOSPITAL_BASED_OUTPATIENT_CLINIC_OR_DEPARTMENT_OTHER): Payer: Self-pay | Admitting: Orthopedic Surgery

## 2015-03-10 ENCOUNTER — Ambulatory Visit (HOSPITAL_BASED_OUTPATIENT_CLINIC_OR_DEPARTMENT_OTHER)
Admission: RE | Admit: 2015-03-10 | Discharge: 2015-03-10 | Disposition: A | Discharge status: Home / Self Care | Payer: Medicare Other | Source: Ambulatory Visit | Attending: Orthopedic Surgery | Admitting: Orthopedic Surgery

## 2015-03-10 ENCOUNTER — Ambulatory Visit (HOSPITAL_BASED_OUTPATIENT_CLINIC_OR_DEPARTMENT_OTHER): Payer: Medicare Other | Admitting: Anesthesiology

## 2015-03-10 ENCOUNTER — Encounter (HOSPITAL_BASED_OUTPATIENT_CLINIC_OR_DEPARTMENT_OTHER)
Admission: RE | Disposition: A | Discharge status: Home / Self Care | Payer: Self-pay | Source: Ambulatory Visit | Attending: Orthopedic Surgery

## 2015-03-10 DIAGNOSIS — Z85828 Personal history of other malignant neoplasm of skin: Secondary | ICD-10-CM | POA: Insufficient documentation

## 2015-03-10 DIAGNOSIS — G5602 Carpal tunnel syndrome, left upper limb: Secondary | ICD-10-CM | POA: Diagnosis not present

## 2015-03-10 DIAGNOSIS — I10 Essential (primary) hypertension: Secondary | ICD-10-CM | POA: Diagnosis not present

## 2015-03-10 DIAGNOSIS — M8580 Other specified disorders of bone density and structure, unspecified site: Secondary | ICD-10-CM | POA: Diagnosis not present

## 2015-03-10 DIAGNOSIS — G5603 Carpal tunnel syndrome, bilateral upper limbs: Secondary | ICD-10-CM | POA: Insufficient documentation

## 2015-03-10 DIAGNOSIS — K279 Peptic ulcer, site unspecified, unspecified as acute or chronic, without hemorrhage or perforation: Secondary | ICD-10-CM | POA: Diagnosis not present

## 2015-03-10 DIAGNOSIS — M419 Scoliosis, unspecified: Secondary | ICD-10-CM | POA: Insufficient documentation

## 2015-03-10 DIAGNOSIS — K5190 Ulcerative colitis, unspecified, without complications: Secondary | ICD-10-CM | POA: Diagnosis not present

## 2015-03-10 DIAGNOSIS — M1990 Unspecified osteoarthritis, unspecified site: Secondary | ICD-10-CM | POA: Diagnosis not present

## 2015-03-10 DIAGNOSIS — Z880 Allergy status to penicillin: Secondary | ICD-10-CM | POA: Diagnosis not present

## 2015-03-10 DIAGNOSIS — Z887 Allergy status to serum and vaccine status: Secondary | ICD-10-CM | POA: Insufficient documentation

## 2015-03-10 DIAGNOSIS — K449 Diaphragmatic hernia without obstruction or gangrene: Secondary | ICD-10-CM | POA: Diagnosis not present

## 2015-03-10 HISTORY — PX: CARPAL TUNNEL RELEASE: SHX101

## 2015-03-10 SURGERY — CARPAL TUNNEL RELEASE
Anesthesia: Monitor Anesthesia Care | Site: Wrist | Laterality: Left

## 2015-03-10 MED ORDER — ONDANSETRON HCL 4 MG/2ML IJ SOLN
INTRAMUSCULAR | Status: DC | PRN
  Administered 2015-03-10: 4 mg via INTRAVENOUS

## 2015-03-10 MED ORDER — SCOPOLAMINE 1 MG/3DAYS TD PT72: 1.0000 | MEDICATED_PATCH | Freq: Once | TRANSDERMAL | Status: DC | PRN

## 2015-03-10 MED ORDER — BUPIVACAINE HCL (PF) 0.25 % IJ SOLN
INTRAMUSCULAR | Status: DC | PRN
  Administered 2015-03-10: 8 mL

## 2015-03-10 MED ORDER — FENTANYL CITRATE (PF) 100 MCG/2ML IJ SOLN
50.0000 ug | INTRAMUSCULAR | Status: DC | PRN
  Administered 2015-03-10: 100 ug via INTRAVENOUS

## 2015-03-10 MED ORDER — CHLORHEXIDINE GLUCONATE 4 % EX LIQD: 60.0000 mL | Freq: Once | CUTANEOUS | Status: DC

## 2015-03-10 MED ORDER — GLYCOPYRROLATE 0.2 MG/ML IJ SOLN: 0.2000 mg | Freq: Once | INTRAMUSCULAR | Status: DC | PRN

## 2015-03-10 MED ORDER — FENTANYL CITRATE (PF) 100 MCG/2ML IJ SOLN
INTRAMUSCULAR | Status: AC
  Filled 2015-03-10: qty 4

## 2015-03-10 MED ORDER — VANCOMYCIN HCL IN DEXTROSE 1-5 GM/200ML-% IV SOLN
1000.0000 mg | INTRAVENOUS | Status: AC
  Administered 2015-03-10: 1000 mg via INTRAVENOUS

## 2015-03-10 MED ORDER — HYDROCODONE-ACETAMINOPHEN 7.5-325 MG PO TABS: 1.0000 | ORAL_TABLET | Freq: Once | ORAL | Status: DC | PRN

## 2015-03-10 MED ORDER — VANCOMYCIN HCL IN DEXTROSE 1-5 GM/200ML-% IV SOLN: 1000.0000 mg | INTRAVENOUS | Status: DC

## 2015-03-10 MED ORDER — PROMETHAZINE HCL 25 MG/ML IJ SOLN: 6.2500 mg | INTRAMUSCULAR | Status: DC | PRN

## 2015-03-10 MED ORDER — VANCOMYCIN HCL IN DEXTROSE 1-5 GM/200ML-% IV SOLN
INTRAVENOUS | Status: AC
  Filled 2015-03-10: qty 200

## 2015-03-10 MED ORDER — LACTATED RINGERS IV SOLN
INTRAVENOUS | Status: DC
  Administered 2015-03-10: 10:00:00 via INTRAVENOUS

## 2015-03-10 MED ORDER — PROPOFOL 10 MG/ML IV BOLUS
INTRAVENOUS | Status: DC | PRN
  Administered 2015-03-10: 10 mg via INTRAVENOUS
  Administered 2015-03-10: 20 mg via INTRAVENOUS

## 2015-03-10 MED ORDER — LIDOCAINE HCL (PF) 0.5 % IJ SOLN
INTRAMUSCULAR | Status: DC | PRN
  Administered 2015-03-10: 30 mL via INTRAVENOUS

## 2015-03-10 MED ORDER — TRAMADOL HCL 50 MG PO TABS: 50.0000 mg | ORAL_TABLET | Freq: Four times a day (QID) | ORAL | Status: DC | PRN

## 2015-03-10 MED ORDER — FENTANYL CITRATE (PF) 100 MCG/2ML IJ SOLN: 25.0000 ug | INTRAMUSCULAR | Status: DC | PRN

## 2015-03-10 MED ORDER — MIDAZOLAM HCL 2 MG/2ML IJ SOLN: 1.0000 mg | INTRAMUSCULAR | Status: DC | PRN

## 2015-03-10 MED ORDER — ONDANSETRON HCL 4 MG/2ML IJ SOLN
INTRAMUSCULAR | Status: AC
  Filled 2015-03-10: qty 2

## 2015-03-10 SURGICAL SUPPLY — 34 items
BLADE SURG 15 STRL LF DISP TIS (BLADE) ×1 IMPLANT
BLADE SURG 15 STRL SS (BLADE) ×3
BNDG COHESIVE 3X5 TAN STRL LF (GAUZE/BANDAGES/DRESSINGS) ×3 IMPLANT
BNDG GAUZE ELAST 4 BULKY (GAUZE/BANDAGES/DRESSINGS) ×3 IMPLANT
CHLORAPREP W/TINT 26ML (MISCELLANEOUS) ×3 IMPLANT
CORDS BIPOLAR (ELECTRODE) ×3 IMPLANT
COVER BACK TABLE 60X90IN (DRAPES) ×3 IMPLANT
COVER MAYO STAND STRL (DRAPES) ×3 IMPLANT
CUFF TOURNIQUET SINGLE 18IN (TOURNIQUET CUFF) ×3 IMPLANT
DRAPE EXTREMITY T 121X128X90 (DRAPE) ×3 IMPLANT
DRAPE SURG 17X23 STRL (DRAPES) ×3 IMPLANT
DRSG PAD ABDOMINAL 8X10 ST (GAUZE/BANDAGES/DRESSINGS) ×3 IMPLANT
GAUZE SPONGE 4X4 12PLY STRL (GAUZE/BANDAGES/DRESSINGS) ×3 IMPLANT
GAUZE XEROFORM 1X8 LF (GAUZE/BANDAGES/DRESSINGS) ×3 IMPLANT
GLOVE BIOGEL PI IND STRL 7.0 (GLOVE) IMPLANT
GLOVE BIOGEL PI IND STRL 8.5 (GLOVE) ×1 IMPLANT
GLOVE BIOGEL PI INDICATOR 7.0 (GLOVE) ×4
GLOVE BIOGEL PI INDICATOR 8.5 (GLOVE) ×2
GLOVE ECLIPSE 6.5 STRL STRAW (GLOVE) ×2 IMPLANT
GLOVE SURG ORTHO 8.0 STRL STRW (GLOVE) ×3 IMPLANT
GOWN STRL REUS W/ TWL LRG LVL3 (GOWN DISPOSABLE) ×1 IMPLANT
GOWN STRL REUS W/TWL LRG LVL3 (GOWN DISPOSABLE) ×3
GOWN STRL REUS W/TWL XL LVL3 (GOWN DISPOSABLE) ×3 IMPLANT
NDL PRECISIONGLIDE 27X1.5 (NEEDLE) IMPLANT
NEEDLE PRECISIONGLIDE 27X1.5 (NEEDLE) ×3 IMPLANT
NS IRRIG 1000ML POUR BTL (IV SOLUTION) ×3 IMPLANT
PACK BASIN DAY SURGERY FS (CUSTOM PROCEDURE TRAY) ×3 IMPLANT
STOCKINETTE 4X48 STRL (DRAPES) ×3 IMPLANT
SUT ETHILON 4 0 PS 2 18 (SUTURE) ×3 IMPLANT
SUT VICRYL 4-0 PS2 18IN ABS (SUTURE) IMPLANT
SYR BULB 3OZ (MISCELLANEOUS) ×3 IMPLANT
SYR CONTROL 10ML LL (SYRINGE) ×2 IMPLANT
TOWEL OR 17X24 6PK STRL BLUE (TOWEL DISPOSABLE) ×3 IMPLANT
UNDERPAD 30X30 (UNDERPADS AND DIAPERS) ×3 IMPLANT

## 2015-03-10 NOTE — H&P (Signed)
Lori Soto is referred by Dr. Laurance Flatten. She is 79. She is complaining of bilateral hand numbness, tingling on a regular basis. Burning left much greater than right. This does not awaken her at night. She has no history of injury to the hand or neck but does have scoliosis. She states she was going to [place] when she had significant problems with her neck. She had a work up there with an MRI that revealed she had pinched nerves in her neck. She complains of moderate to severe burning pain with numbness and weakness. Heat and rest have helped. She has had nerve conductions in September. She has a history of arthritis. There is no history of thyroid problems, diabetes or gout. There is a family history of diabetes and arthritis. She is not taking any discrete medicines. She has worn a brace but the brace has now stopped preventing her from developing the constant numbness and tingling.She has had her nerve conductions done. She shows bilateral median compression with right median sensory response of 4.9 milliseconds, left is at 9.8 with amplitude of 3. The amplitude on the right side is 6.   PAST MEDICAL HISTORY:  She is allergic to PCN and flu shots. She is on the following medications: Asacol HD, Bromelains, calcium, vitamin D, CoQ10, Collagen, Estradiol, Glucosamine and Chondroitin Sulfate, Hyaluronic acid, milk thistle, multivitamins, potassium, Pyridoxine, turmeric, vitamin B12, and Zinc. She has had hernia repair and hysterectomy and bunionectomy bilaterally.  FAMILY MEDICAL HISTORY: Positive for diabetes, high BP and arthritis.  SOCIAL HISTORY:  She does not smoke or use alcohol. She is widowed. She is a retired Optometrist.   REVIEW OF SYSTEMS: Positive for anemia and arthritis, otherwise negative 14 points.  Lori Soto is an 79 y.o. female.   Chief Complaint: numbness hands HPI: see above  Past Medical History  Diagnosis Date  . Ulcerative colitis   . Shingles   . Cataract   . H/O  hiatal hernia   . Cancer ([place])     skin cancer on nose  . Anemia   . Scoliosis   . Osteopenia   . Carpal tunnel syndrome, bilateral   . Shingles     Past Surgical History  Procedure Laterality Date  . Colonoscopy    . Upper gastrointestinal endoscopy    . Bunionectomy      Bilateral  . Hernia      Right inguinal  . Abdominal hysterectomy    . Hernia repair  1961    right inguinal hernia  . Cataract extraction w/phaco  05/28/2012    Procedure: CATARACT EXTRACTION PHACO AND INTRAOCULAR LENS PLACEMENT (IOC);  Surgeon: Tonny Branch, MD;  Location: [facility];  Service: Ophthalmology;  Laterality: Right;  CDE=12.84  . Cataract extraction w/phaco  06/07/2012    Procedure: CATARACT EXTRACTION PHACO AND INTRAOCULAR LENS PLACEMENT (IOC);  Surgeon: Tonny Branch, MD;  Location: [facility];  Service: Ophthalmology;  Laterality: Left;  CDE 17.60    Family History  Problem Relation Age of Onset  . Arthritis Mother   . Prostate cancer Father   . Liver cancer Brother   . Lung cancer Brother   . Heart disease Sister     Rheumatic Fever  . Diabetes Sister   . Colon cancer Sister   . GI problems Sister     diverticulitis  . Diabetes Sister   . Neuropathy Sister   . COPD Sister    Social History:  reports that she has never smoked. She has never  used smokeless tobacco. She reports that she drinks alcohol. She reports that she does not use illicit drugs.  Allergies:  Allergies  Allergen Reactions  . Influenza Virus Vacc Split Pf Rash    Per Patient she was told by her PCP not to ever take this injection again  . Penicillins Itching and Swelling    Patient states that she had a rash also    Medications Prior to Admission  Medication Sig Dispense Refill  . ASACOL HD 800 MG TBEC TAKE 2 TABLETS (1,600 MG) TWICE A DAY 360 tablet 3  . Bromelains 500 MG TABS Take 500 mg by mouth daily.    . Calcium Carbonate-Vitamin D (CALCIUM 600+D) 600-400 MG-UNIT per tablet Take 1 tablet by mouth every evening.     [name] 500 MG CAPS Take by mouth.    . Coenzyme Q10 (CO Q-10 PO) Take by mouth 2 (two) times daily. With red yeast rice Pur[employer]ans Pride Brand    . COLLAGEN PO Take 1 tablet by mouth daily. This is known as 1-2-3    . Cyanocobalamin (VITAMIN B 12 PO) Take 1,000 mcg by mouth daily.     [name] estradiol (ESTRACE) 1 MG tablet Take 1 mg by mouth every other day.     . Ginkgo Biloba 40 MG TABS Take 120 mg by mouth daily.      [name] glucosamine-chondroitin 500-400 MG tablet Take 1 tablet by mouth 2 (two) times daily.      [name] Hyaluronic Acid-Vitamin C (HYALURONIC ACID PO) Take 80 mg by mouth daily.    [name] MILK THISTLE PO Take 100 mg by mouth daily.    . Misc Natural Products (BUTCHERS BROOM PO) Take by mouth daily.      . MULTIPLE VITAMINS PO Take by mouth daily. In the mornings    . Nutritional Supplements (GRAPESEED EXTRACT PO) Take by mouth. 60 mg 1 tab a day    . OAT BRAN SOLUBLE PO Take 1 tablet by mouth daily.    [name] OVER THE COUNTER MEDICATION daily. Vision Gold Luten 66 mg & Billberry 20 mg    . OVER THE COUNTER MEDICATION Alphalipoic Acid 250 mg daily    . POTASSIUM GLUCONATE PO Take 99 mg by mouth daily. This is Chelated Potassium -Puritans Pride    . pyridOXINE (VITAMIN B-6) 100 MG tablet Take 100 mg by mouth daily.     . Turmeric 500 MG CAPS Take by mouth. Patient takes  1 in the morning, 1 at lunch and 1 at bedtime.    . Vitamins C E (CRANBERRY CONCENTRATE PO) Take 2,500 mg by mouth daily.     . Zinc 50 MG CAPS Take 25 mg by mouth daily.       No results found for this or any previous visit (from the past 48 hour(s)).  No results found.   Pertinent items are noted in HPI.  Height 5' (1.524 m), weight 61.236 kg (135 lb).  General appearance: alert, cooperative and appears stated age Head: Normocephalic, without obvious abnormality Neck: no JVD Resp: clear to auscultation bilaterally Cardio: regular rate and rhythm, S1, S2 normal, no murmur, click, rub or gallop GI: soft, non-tender;  bowel sounds normal; no masses,  no organomegaly Extremities: numbness hands Pulses: 2+ and symmetric Skin: Skin color, texture, turgor normal. No rashes or lesions Neurologic: Grossly normal Incision/Wound: na  Assessment/Plan X-rays of her hands reveals arthritic changes of the PIP and DIP joints, IP joint of her thumb, PIP and  DIP of all her fingers, CMC joint is Eaton stage III bilaterally.  X-rays of her cervical spine reveals significant degenerative changes with step off posteriorly 4-5, 5-6, 6-7 with significant degenerative changes and foraminal encroachment.  DIAGNOSIS: Carpal tunnel syndrome  PLAN:We have discussed the possibility of surgical decompression of the median nerve with her especially the left side which she would like to have done. Pre, peri and post op care are discussed along with risks and complications. Patient is aware there is no guarantee with surgery, possibility of infection, injury to arteries, nerves, and tendons, incomplete relief and dystrophy. She is scheduled for left carpal tunnel release as an outpatient under regional anesthesia.   Katrece Roediger R 03/10/2015, 9:28 AM

## 2015-03-10 NOTE — Transfer of Care (Signed)
**Note Lori-Identified via Obfuscation** Immediate Anesthesia Transfer of Care Note  Patient: Lori Soto  Procedure(s) Performed: Procedure(s): LEFT CARPAL TUNNEL RELEASE (Left)  Patient Location: PACU  Anesthesia Type:MAC and Bier block  Level of Consciousness: awake, alert  and oriented  Airway & Oxygen Therapy: Patient Spontanous Breathing and Patient connected to face mask oxygen  Post-op Assessment: Report given to RN and Post -op Vital signs reviewed and stable  Post vital signs: Reviewed and stable  Last Vitals:  Filed Vitals:   03/10/15 1145  BP:   Pulse: 58  Temp:   Resp: 16    Complications: No apparent anesthesia complications

## 2015-03-10 NOTE — Anesthesia Preprocedure Evaluation (Addendum)
Anesthesia Evaluation  Patient identified by MRN, date of birth, ID band Patient awake    Reviewed: Allergy & Precautions, NPO status , Patient's Chart, lab work & pertinent test results  Airway Mallampati: II  TM Distance: >3 FB Neck ROM: Full    Dental   Pulmonary neg pulmonary ROS,    breath sounds clear to auscultation       Cardiovascular hypertension,  Rhythm:Regular Rate:Normal     Neuro/Psych negative neurological ROS     GI/Hepatic Neg liver ROS, hiatal hernia, PUD,   Endo/Other  negative endocrine ROS  Renal/GU negative Renal ROS     Musculoskeletal  (+) Arthritis ,   Abdominal   Peds  Hematology negative hematology ROS (+)   Anesthesia Other Findings   Reproductive/Obstetrics                             Anesthesia Physical Anesthesia Plan  ASA: II  Anesthesia Plan: MAC and Bier Block   Post-op Pain Management:    Induction: Intravenous  Airway Management Planned: Natural Airway and Simple Face Mask  Additional Equipment:   Intra-op Plan:   Post-operative Plan:   Informed Consent: I have reviewed the patients History and Physical, chart, labs and discussed the procedure including the risks, benefits and alternatives for the proposed anesthesia with the patient or authorized representative who has indicated his/her understanding and acceptance.     Plan Discussed with: CRNA  Anesthesia Plan Comments:         Anesthesia Quick Evaluation

## 2015-03-10 NOTE — Discharge Instructions (Addendum)

## 2015-03-10 NOTE — Brief Op Note (Signed)
03/10/2015  11:41 AM  PATIENT:  Lori Soto  79 y.o. female  PRE-OPERATIVE DIAGNOSIS:  LEFT CARPAL TUNNEL SYNDROME  POST-OPERATIVE DIAGNOSIS:  LEFT CARPAL TUNNEL SYNDROME  PROCEDURE:  Procedure(s): LEFT CARPAL TUNNEL RELEASE (Left)  SURGEON:  Surgeon(s) and Role:    * Daryll Brod, MD - Primary  PHYSICIAN ASSISTANT:   ASSISTANTS: none   ANESTHESIA:   local and regional  EBL:  Total I/O In: 200 [I.V.:200] Out: -   BLOOD ADMINISTERED:none  DRAINS: none   LOCAL MEDICATIONS USED:  BUPIVICAINE   SPECIMEN:  No Specimen  DISPOSITION OF SPECIMEN:  N/A  COUNTS:  YES  TOURNIQUET:   Total Tourniquet Time Documented: Forearm (Left) - 19 minutes Total: Forearm (Left) - 19 minutes   DICTATION: .Other Dictation: Dictation Number [id number]  PLAN OF CARE: Discharge to home after PACU  PATIENT DISPOSITION:  PACU - hemodynamically stable.

## 2015-03-10 NOTE — Op Note (Signed)
Dictation Number [id number]

## 2015-03-10 NOTE — Anesthesia Postprocedure Evaluation (Signed)
  Anesthesia Post-op Note  Patient: Lori Soto  Procedure(s) Performed: Procedure(s): LEFT CARPAL TUNNEL RELEASE (Left)  Patient Location: PACU  Anesthesia Type:MAC  Level of Consciousness: awake and alert   Airway and Oxygen Therapy: Patient Spontanous Breathing  Post-op Pain: none  Post-op Assessment: Post-op Vital signs reviewed              Post-op Vital Signs: Reviewed  Last Vitals:  Filed Vitals:   03/10/15 1200  BP:   Pulse: 57  Temp:   Resp: 15    Complications: No apparent anesthesia complications

## 2015-03-11 ENCOUNTER — Encounter (HOSPITAL_BASED_OUTPATIENT_CLINIC_OR_DEPARTMENT_OTHER): Payer: Self-pay | Admitting: Orthopedic Surgery

## 2015-03-11 NOTE — Op Note (Signed)
[name], Lori Soto               ACCOUNT NO.:  [id number]  MEDICAL RECORD NO.:  [id number]  LOCATION:                               FACILITY:  [facility]  PHYSICIAN:  Daryll Brod, M.D.       DATE OF BIRTH:  [birth year]  DATE OF PROCEDURE:  03/10/2015 DATE OF DISCHARGE:  03/10/2015                              OPERATIVE REPORT   PREOPERATIVE DIAGNOSIS:  Carpal tunnel syndrome, left hand.  POSTOPERATIVE DIAGNOSIS:  Carpal tunnel syndrome, left hand.  OPERATION:  Decompression of the left median nerve.  SURGEON:  Daryll Brod, M.D.  ANESTHESIA:  Forearm-based IV regional with local infiltration.  ANESTHESIOLOGIST:  Soledad Gerlach, MD  HISTORY:  The patient is a 79 year old female with history of bilateral carpal tunnel syndrome.  Nerve conductions are positive.  This has not responded to conservative treatment.  She has elected to undergo surgical decompression of the median nerve.  Pre, peri, and postoperative course have been discussed along with risks and complications.  She is aware that there is no guarantee with the surgery; possibility of infection; recurrence of injury to arteries, nerves, tendons; incomplete relief of symptoms; dystrophy.  In the preoperative area, the patient is seen, the extremity marked by both patient and surgeon and antibiotic given.  DESCRIPTION OF PROCEDURE:  The patient was brought to the operating room, where forearm-based IV regional anesthetic was carried out without difficulty.  She was prepped using ChloraPrep, supine position with left arm free.  A 3-minute dry-time was allowed.  Time-out taken, confirming the patient and procedure.  A longitudinal incision was made in the left palm, carried down through the subcutaneous tissue.  Bleeders were electrocauterized.  Palmar fascia was split.  Superficial palmar arch was identified.  The flexor tendon to the ring and little finger identified to the ulnar side of the median nerve.  The  carpal retinaculum was incised with sharp dissection.  Right angle and Sewall retractor were placed between the skin and forearm fascia.  The fascia was released for approximately 2 cm proximal to the wrist crease under direct vision.  Canal was explored.  Persistent median artery was present, this was not thrombosed.  Motor branch entered into the muscle. No further lesions were identified.  The wound was irrigated with saline and closed with interrupted 4-0 nylon sutures.  Local infiltration with 0.25% bupivacaine without epinephrine was given, approximately 8 mL was used.  Sterile compressive dressing with fingers and thumb free was applied.  On deflation of the tourniquet, all fingers were immediately pinked.  She was taken to the recovery room for observation in satisfactory condition.  She will be discharged to home to return to the [facility] in 1 week, on tramadol.          ______________________________ Daryll Brod, M.D.     GK/MEDQ  D:  03/10/2015  T:  03/11/2015  Job:  [id number]

## 2015-03-13 ENCOUNTER — Ambulatory Visit: Payer: Medicare Other | Admitting: Physical Therapy

## 2015-03-13 DIAGNOSIS — M546 Pain in thoracic spine: Secondary | ICD-10-CM | POA: Diagnosis not present

## 2015-03-13 DIAGNOSIS — M545 Low back pain, unspecified: Secondary | ICD-10-CM

## 2015-03-13 NOTE — Therapy (Signed)
[facility] [place], [place], [address] Phone: [phone]   Fax:  [phone]  Physical Therapy Treatment  Patient Details  Name: Lori Soto MRN: [id number] Date of Birth: [birth year] Referring Provider: Dr. Laurance Flatten Dr. Brooks/ Dr. Nelva Bush  Encounter Date: 03/13/2015      PT End of Session - 03/13/15 1143    Visit Number 11   Number of Visits 12   Date for PT Re-Evaluation 03/27/15   PT Start Time 0900   PT Stop Time 0948   PT Time Calculation (min) 48 min      Past Medical History  Diagnosis Date  . Ulcerative colitis   . Shingles   . Cataract   . H/O hiatal hernia   . Cancer ([place])     skin cancer on nose  . Anemia   . Scoliosis   . Osteopenia   . Carpal tunnel syndrome, bilateral   . Shingles     Past Surgical History  Procedure Laterality Date  . Colonoscopy    . Upper gastrointestinal endoscopy    . Bunionectomy      Bilateral  . Hernia      Right inguinal  . Abdominal hysterectomy    . Hernia repair  1961    right inguinal hernia  . Cataract extraction w/phaco  05/28/2012    Procedure: CATARACT EXTRACTION PHACO AND INTRAOCULAR LENS PLACEMENT (IOC);  Surgeon: Tonny Branch, MD;  Location: [facility];  Service: Ophthalmology;  Laterality: Right;  CDE=12.84  . Cataract extraction w/phaco  06/07/2012    Procedure: CATARACT EXTRACTION PHACO AND INTRAOCULAR LENS PLACEMENT (IOC);  Surgeon: Tonny Branch, MD;  Location: [facility];  Service: Ophthalmology;  Laterality: Left;  CDE 17.60  . Carpal tunnel release Left 03/10/2015    Procedure: LEFT CARPAL TUNNEL RELEASE;  Surgeon: Daryll Brod, MD;  Location: [facility];  Service: Orthopedics;  Laterality: Left;    There were no vitals filed for this visit.  Visit Diagnosis:  Bilateral low back pain without sciatica  Bilateral thoracic back pain      Subjective Assessment - 03/13/15 1137    Subjective Had left carpal tunnel surgery 3 days ago.   Pain Score 2    Pain Location Back   Pain Orientation Right;Mid;Lower   Pain Descriptors / Indicators Nagging   Pain Type Chronic pain   Pain Onset More than a month ago                                   PT Short Term Goals - 01/15/15 1055    PT SHORT TERM GOAL #1   Title Ind with a HEP.   Time 2   Period Weeks   Status Achieved           PT Long Term Goals - 02/12/15 1520    PT LONG TERM GOAL #1   Title Perform ADL's with pain not > 3/10.   Time 6   Period Weeks   Status On-going   PT LONG TERM GOAL #2   Title Stand 45 minutes with pain not > 3-4/10.   Time 6   Period Weeks   Status On-going               Problem List Patient Active Problem List   Diagnosis Date Noted  . Osteopenia of the elderly 02/19/2014  . DDD (degenerative disc disease), lumbar 12/03/2013  .  Scoliosis 12/03/2013  . Chronic LBP 05/14/2013  . IBS (irritable bowel syndrome) 11/08/2012  . UC (ulcerative colitis) ([place]) 04/23/2012  . Palpitation 10/26/2011  . Hypertension 10/26/2011  . Chronic ulcerative colitis ([place]) 03/23/2011   Treatment:  Moist heat and IFC to patient's lumbar area f/b Int traction at 85# (99 sec on and 5 sec off) x 15 minutes.  Patient tolerated treatment without complaint. Carollynn Pennywell, [place] MPT 03/13/2015, 11:43 AM  [facility] [address] [place], [place], [address] Phone: [phone]   Fax:  [phone]  Name: Yoselin Amerman MRN: [id number] Date of Birth: March 26, 1936

## 2015-03-18 ENCOUNTER — Ambulatory Visit: Payer: Medicare Other | Admitting: *Deleted

## 2015-03-18 DIAGNOSIS — M545 Low back pain, unspecified: Secondary | ICD-10-CM

## 2015-03-18 DIAGNOSIS — M546 Pain in thoracic spine: Secondary | ICD-10-CM | POA: Diagnosis not present

## 2015-03-18 DIAGNOSIS — G5602 Carpal tunnel syndrome, left upper limb: Secondary | ICD-10-CM | POA: Insufficient documentation

## 2015-03-18 NOTE — Therapy (Signed)
[facility] [place], [place], [address] Phone: [phone]   Fax:  [phone]  Physical Therapy Treatment  Patient Details  Name: Lori Soto MRN: [id number] Date of Birth: 1935/06/02 Referring Provider: Dr. Laurance Flatten Dr. Brooks/ Dr. Nelva Bush  Encounter Date: 03/18/2015      PT End of Session - 03/18/15 1044    Visit Number 12   Number of Visits 24   Date for PT Re-Evaluation 04/14/15   PT Start Time 0945   PT Stop Time 1031   PT Time Calculation (min) 46 min      Past Medical History  Diagnosis Date  . Ulcerative colitis   . Shingles   . Cataract   . H/O hiatal hernia   . Cancer ([place])     skin cancer on nose  . Anemia   . Scoliosis   . Osteopenia   . Carpal tunnel syndrome, bilateral   . Shingles     Past Surgical History  Procedure Laterality Date  . Colonoscopy    . Upper gastrointestinal endoscopy    . Bunionectomy      Bilateral  . Hernia      Right inguinal  . Abdominal hysterectomy    . Hernia repair  1961    right inguinal hernia  . Cataract extraction w/phaco  05/28/2012    Procedure: CATARACT EXTRACTION PHACO AND INTRAOCULAR LENS PLACEMENT (IOC);  Surgeon: Tonny Branch, MD;  Location: [facility];  Service: Ophthalmology;  Laterality: Right;  CDE=12.84  . Cataract extraction w/phaco  06/07/2012    Procedure: CATARACT EXTRACTION PHACO AND INTRAOCULAR LENS PLACEMENT (IOC);  Surgeon: Tonny Branch, MD;  Location: [facility];  Service: Ophthalmology;  Laterality: Left;  CDE 17.60  . Carpal tunnel release Left 03/10/2015    Procedure: LEFT CARPAL TUNNEL RELEASE;  Surgeon: Daryll Brod, MD;  Location: [facility];  Service: Orthopedics;  Laterality: Left;    There were no vitals filed for this visit.  Visit Diagnosis:  Bilateral low back pain without sciatica  Bilateral thoracic back pain      Subjective Assessment - 03/18/15 1014    Subjective Had left carpal tunnel surgery 3 days ago.  Need  to leave early today. Have MD appt. for carpal tunnel Sx   Limitations Standing   How long can you stand comfortably? 30 minutes   Currently in Pain? Yes   Pain Score 3    Pain Location Back   Pain Orientation Right;Mid;Lower   Pain Descriptors / Indicators Nagging   Pain Type Chronic pain   Pain Onset More than a month ago   Pain Frequency Constant   Aggravating Factors  prolonged standing   Pain Relieving Factors traction                         [facility] Adult PT Treatment/Exercise - 03/18/15 0001    Modalities   Modalities Traction;Electrical Stimulation;Moist Heat   Moist Heat Therapy   Number Minutes Moist Heat 15 Minutes   Moist Heat Location Lumbar Spine   Electrical Stimulation   Electrical Stimulation Location RT side LB IFC x 15 mins Hooklying   Electrical Stimulation Goals Pain   Traction   Type of Traction Lumbar   Min (lbs) 5   Max (lbs) 80   Hold Time 99   Rest Time 5   Time 15  PT Short Term Goals - 01/15/15 1055    PT SHORT TERM GOAL #1   Title Ind with a HEP.   Time 2   Period Weeks   Status Achieved           PT Long Term Goals - 02/12/15 1520    PT LONG TERM GOAL #1   Title Perform ADL's with pain not > 3/10.   Time 6   Period Weeks   Status On-going   PT LONG TERM GOAL #2   Title Stand 45 minutes with pain not > 3-4/10.   Time 6   Period Weeks   Status On-going               Plan - 03/18/15 1047    Clinical Impression Statement Pt did very well today and feels that traction continues to help with back pain. She is closer to meeting goals,but pain is still to high when standing and with ADLs. Her pain was 8-9/10 with these Acts , and now 6-7/10   Pt will benefit from skilled therapeutic intervention in order to improve on the following deficits Pain;Decreased activity tolerance   Clinical Impairments Affecting Rehab Potential patient weight 135#   PT Frequency 2x / week   PT Duration 6  weeks   PT Treatment/Interventions Traction;Moist Heat;Therapeutic exercise;Manual techniques   PT Next Visit Plan Max traction at 85#.   PT Home Exercise Plan issue HEP   Consulted and Agree with Plan of Care Patient        Problem List Patient Active Problem List   Diagnosis Date Noted  . Osteopenia of the elderly 02/19/2014  . DDD (degenerative disc disease), lumbar 12/03/2013  . Scoliosis 12/03/2013  . Chronic LBP 05/14/2013  . IBS (irritable bowel syndrome) 11/08/2012  . UC (ulcerative colitis) ([place]) 04/23/2012  . Palpitation 10/26/2011  . Hypertension 10/26/2011  . Chronic ulcerative colitis ([place]) 03/23/2011    Dencil Cayson,CHRIS, PTA 03/18/2015, 11:04 AM  [facility] [place], [place], [address] Phone: [phone]   Fax:  [phone]  Name: Lori Soto MRN: [id number] Date of Birth: Aug 17, 1935

## 2015-03-24 ENCOUNTER — Ambulatory Visit: Payer: Medicare Other | Admitting: Physical Therapy

## 2015-03-24 ENCOUNTER — Encounter: Payer: Self-pay | Admitting: Physical Therapy

## 2015-03-24 DIAGNOSIS — M545 Low back pain, unspecified: Secondary | ICD-10-CM

## 2015-03-24 DIAGNOSIS — M546 Pain in thoracic spine: Secondary | ICD-10-CM | POA: Diagnosis not present

## 2015-03-24 NOTE — Therapy (Signed)
[facility] [place], [place], [address] Phone: [phone]   Fax:  [phone]  Physical Therapy Treatment  Patient Details  Name: Lori Soto MRN: [id number] Date of Birth: May 07, 1935 Referring Provider: Dr. Laurance Flatten Dr. Brooks/ Dr. Nelva Bush  Encounter Date: 03/24/2015      PT End of Session - 03/24/15 1445    Visit Number 13   Number of Visits 24   Date for PT Re-Evaluation 04/14/15   PT Start Time 1435   PT Stop Time 1514   PT Time Calculation (min) 39 min   Activity Tolerance Patient tolerated treatment well   Behavior During Therapy [facility] for tasks assessed/performed      Past Medical History  Diagnosis Date  . Ulcerative colitis   . Shingles   . Cataract   . H/O hiatal hernia   . Cancer ([place])     skin cancer on nose  . Anemia   . Scoliosis   . Osteopenia   . Carpal tunnel syndrome, bilateral   . Shingles     Past Surgical History  Procedure Laterality Date  . Colonoscopy    . Upper gastrointestinal endoscopy    . Bunionectomy      Bilateral  . Hernia      Right inguinal  . Abdominal hysterectomy    . Hernia repair  1961    right inguinal hernia  . Cataract extraction w/phaco  05/28/2012    Procedure: CATARACT EXTRACTION PHACO AND INTRAOCULAR LENS PLACEMENT (IOC);  Surgeon: Tonny Branch, MD;  Location: [facility];  Service: Ophthalmology;  Laterality: Right;  CDE=12.84  . Cataract extraction w/phaco  06/07/2012    Procedure: CATARACT EXTRACTION PHACO AND INTRAOCULAR LENS PLACEMENT (IOC);  Surgeon: Tonny Branch, MD;  Location: [facility];  Service: Ophthalmology;  Laterality: Left;  CDE 17.60  . Carpal tunnel release Left 03/10/2015    Procedure: LEFT CARPAL TUNNEL RELEASE;  Surgeon: Daryll Brod, MD;  Location: [place];  Service: Orthopedics;  Laterality: Left;    There were no vitals filed for this visit.  Visit Diagnosis:  Bilateral low back pain without sciatica  Bilateral thoracic back  pain      Subjective Assessment - 03/24/15 1445    Subjective Reports that she feels like she is getting better.   Limitations Standing   How long can you stand comfortably? 30 minutes   Currently in Pain? Yes   Pain Score 5    Pain Location Back   Pain Orientation Lower   Pain Descriptors / Indicators Nagging;Throbbing   Pain Type Chronic pain   Pain Onset More than a month ago            [facility] PT Assessment - 03/24/15 0001    Assessment   Medical Diagnosis Back pain.                     [place] Adult PT Treatment/Exercise - 03/24/15 0001    Lumbar Exercises: Stretches   Single Knee to Chest Stretch 3 reps;30 seconds  BLE   Double Knee to Chest Stretch 1 rep;20 seconds   Lumbar Exercises: Supine   Ab Set 20 reps;5 seconds   Clam Other (comment)  x25 reps   Bent Knee Raise Other (comment)  x25 reps   Bridge Other (comment)  x25 reps   Straight Leg Raise Other (comment)  x25 reps bilaterally  PT Short Term Goals - 01/15/15 1055    PT SHORT TERM GOAL #1   Title Ind with a HEP.   Time 2   Period Weeks   Status Achieved           PT Long Term Goals - 02/12/15 1520    PT LONG TERM GOAL #1   Title Perform ADL's with pain not > 3/10.   Time 6   Period Weeks   Status On-going   PT LONG TERM GOAL #2   Title Stand 45 minutes with pain not > 3-4/10.   Time 6   Period Weeks   Status On-going               Plan - 03/24/15 1524    Clinical Impression Statement Patient tolerated today's treatmnet very well and reported a positive traction treatment with the ability to feel her back feel good. Completed all therapeutic exercises with core contraction and reuqired minimal multimodal cueing for proper technique. Goals remain on-going at this time seocndary to pain with standing and ADLs. Normal traction response noted following removal of the traction system. No reports of increased pain with tractior or with the  exercises. Denied lumbar back pain following today's treatment.   Pt will benefit from skilled therapeutic intervention in order to improve on the following deficits Pain;Decreased activity tolerance   Rehab Potential Good   Clinical Impairments Affecting Rehab Potential patient weight 135#   PT Frequency 2x / week   PT Duration 6 weeks   PT Treatment/Interventions Traction;Moist Heat;Therapeutic exercise;Manual techniques   PT Next Visit Plan Continue core strengthening and traction at max of 85#.   Consulted and Agree with Plan of Care Patient        Problem List Patient Active Problem List   Diagnosis Date Noted  . Osteopenia of the elderly 02/19/2014  . DDD (degenerative disc disease), lumbar 12/03/2013  . Scoliosis 12/03/2013  . Chronic LBP 05/14/2013  . IBS (irritable bowel syndrome) 11/08/2012  . UC (ulcerative colitis) ([place]) 04/23/2012  . Palpitation 10/26/2011  . Hypertension 10/26/2011  . Chronic ulcerative colitis ([place]) 03/23/2011    Wynelle Fanny, PTA 03/24/2015, 3:30 PM  [facility] [address] [place], [place], [address] Phone: [phone]   Fax:  [phone]  Name: Lori Soto MRN: [id number] Date of Birth: May 02, 1936

## 2015-03-25 ENCOUNTER — Encounter: Payer: Medicare Other | Admitting: Physical Therapy

## 2015-04-02 ENCOUNTER — Encounter: Payer: Self-pay | Admitting: *Deleted

## 2015-04-02 ENCOUNTER — Ambulatory Visit: Payer: Medicare Other | Attending: Family Medicine | Admitting: *Deleted

## 2015-04-02 DIAGNOSIS — M546 Pain in thoracic spine: Secondary | ICD-10-CM | POA: Diagnosis not present

## 2015-04-02 DIAGNOSIS — M545 Low back pain, unspecified: Secondary | ICD-10-CM

## 2015-04-02 NOTE — Therapy (Signed)
[facility] [place], [place], [address] Phone: [phone]   Fax:  [phone]  Physical Therapy Treatment  Patient Details  Name: Lori Soto MRN: [id number] Date of Birth: 10/25/1935 Referring Provider: Dr. Laurance Flatten Dr. Brooks/ Dr. Nelva Bush  Encounter Date: 04/02/2015      PT End of Session - 04/02/15 1502    Visit Number 14   Number of Visits 24   Date for PT Re-Evaluation 04/14/15   PT Start Time 1430   PT Stop Time 1509   PT Time Calculation (min) 39 min      Past Medical History  Diagnosis Date  . Ulcerative colitis   . Shingles   . Cataract   . H/O hiatal hernia   . Cancer ([place])     skin cancer on nose  . Anemia   . Scoliosis   . Osteopenia   . Carpal tunnel syndrome, bilateral   . Shingles     Past Surgical History  Procedure Laterality Date  . Colonoscopy    . Upper gastrointestinal endoscopy    . Bunionectomy      Bilateral  . Hernia      Right inguinal  . Abdominal hysterectomy    . Hernia repair  1961    right inguinal hernia  . Cataract extraction w/phaco  05/28/2012    Procedure: CATARACT EXTRACTION PHACO AND INTRAOCULAR LENS PLACEMENT (IOC);  Surgeon: Tonny Branch, MD;  [facility]: [facility];  Service: Ophthalmology;  Laterality: Right;  CDE=12.84  . Cataract extraction w/phaco  06/07/2012    Procedure: CATARACT EXTRACTION PHACO AND INTRAOCULAR LENS PLACEMENT (IOC);  Surgeon: Tonny Branch, MD;  [facility]: [facility];  Service: Ophthalmology;  Laterality: Left;  CDE 17.60  . Carpal tunnel release Left 03/10/2015    Procedure: LEFT CARPAL TUNNEL RELEASE;  Surgeon: Daryll Brod, MD;  Location: [place];  Service: Orthopedics;  Laterality: Left;    There were no vitals filed for this visit.  Visit Diagnosis:  Bilateral low back pain without sciatica  Bilateral thoracic back pain      Subjective Assessment - 04/02/15 1433    Subjective Reports that she feels like she is getting better.  LBP is always less after RXs   Limitations Standing   How long can you stand comfortably? 30 minutes   Currently in Pain? Yes   Pain Score 8    Pain Location Back   Pain Orientation Lower   Pain Descriptors / Indicators Nagging;Throbbing   Pain Type Chronic pain   Pain Onset More than a month ago   Pain Frequency Constant   Aggravating Factors  prolonged standing   Pain Relieving Factors traction                         [facility] Adult PT Treatment/Exercise - 04/02/15 0001    Exercises   Exercises Lumbar   Lumbar Exercises: Supine   Ab Set 20 reps;5 seconds   Clam Other (comment)  x25 reps   Bent Knee Raise Other (comment)  x25 reps   Bridge Other (comment)  x25 reps   Straight Leg Raise Other (comment)  x25 reps bilaterally   Lumbar Exercises: Prone   Straight Leg Raise 20 reps;2 seconds  2x10 each LE   Modalities   Modalities Traction;Electrical Stimulation;Moist Heat   Traction   Type of Traction Lumbar   Min (lbs) 5   Max (lbs) 80   Hold Time 99  Rest Time 5   Time 18                  PT Short Term Goals - 01/15/15 1055    PT SHORT TERM GOAL #1   Title Ind with a HEP.   Time 2   Period Weeks   Status Achieved           PT Long Term Goals - 02/12/15 1520    PT LONG TERM GOAL #1   Title Perform ADL's with pain not > 3/10.   Time 6   Period Weeks   Status On-going   PT LONG TERM GOAL #2   Title Stand 45 minutes with pain not > 3-4/10.   Time 6   Period Weeks   Status On-going               Plan - 04/02/15 1503    Clinical Impression Statement Pt did great today and was able to add prone SLR to core exs. She did well with pelvic Traction at 80#s. and feels that she is able to do better with ADL's and prolonged standing when she has PT. Goals NM due to pain >3-4/10   Clinical Impairments Affecting Rehab Potential patient weight 135#   PT Frequency 2x / week   PT Duration 6 weeks   PT Treatment/Interventions  Traction;Moist Heat;Therapeutic exercise;Manual techniques   PT Next Visit Plan Continue core strengthening and traction at max of 85#.   PT Home Exercise Plan issue HEP   Consulted and Agree with Plan of Care Patient        Problem List Patient Active Problem List   Diagnosis Date Noted  . Osteopenia of the elderly 02/19/2014  . DDD (degenerative disc disease), lumbar 12/03/2013  . Scoliosis 12/03/2013  . Chronic LBP 05/14/2013  . IBS (irritable bowel syndrome) 11/08/2012  . UC (ulcerative colitis) ([place]) 04/23/2012  . Palpitation 10/26/2011  . Hypertension 10/26/2011  . Chronic ulcerative colitis ([place]) 03/23/2011    RAMSEUR,CHRIS, PTA 04/02/2015, 3:12 PM  [facility] [place], [place], [address] Phone: [phone]   Fax:  [phone]  Name: Genelle Economou MRN: [id number] Date of Birth: 1936/02/11

## 2015-04-09 ENCOUNTER — Ambulatory Visit: Payer: Medicare Other | Admitting: Physical Therapy

## 2015-04-09 DIAGNOSIS — M546 Pain in thoracic spine: Secondary | ICD-10-CM | POA: Diagnosis not present

## 2015-04-09 DIAGNOSIS — M545 Low back pain, unspecified: Secondary | ICD-10-CM

## 2015-04-09 NOTE — Therapy (Signed)
[facility] [place], [place], [address] Phone: [phone]   Fax:  [phone]  Physical Therapy Treatment  Patient Details  Name: Lori Soto MRN: [id number] Date of Birth: Jun 16, 1935 Referring Provider: Dr. Laurance Flatten Dr. Brooks/ Dr. Nelva Bush  Encounter Date: 04/09/2015      PT End of Session - 04/09/15 1305    Visit Number 15   Number of Visits 24   Date for PT Re-Evaluation 04/14/15   PT Start Time 3299   PT Stop Time 1354   PT Time Calculation (min) 49 min   Activity Tolerance Patient tolerated treatment well   Behavior During Therapy [facility] for tasks assessed/performed      Past Medical History  Diagnosis Date  . Ulcerative colitis   . Shingles   . Cataract   . H/O hiatal hernia   . Cancer ([place])     skin cancer on nose  . Anemia   . Scoliosis   . Osteopenia   . Carpal tunnel syndrome, bilateral   . Shingles     Past Surgical History  Procedure Laterality Date  . Colonoscopy    . Upper gastrointestinal endoscopy    . Bunionectomy      Bilateral  . Hernia      Right inguinal  . Abdominal hysterectomy    . Hernia repair  1961    right inguinal hernia  . Cataract extraction w/phaco  05/28/2012    Procedure: CATARACT EXTRACTION PHACO AND INTRAOCULAR LENS PLACEMENT (IOC);  Surgeon: Tonny Branch, MD;  Location: [facility];  Service: Ophthalmology;  Laterality: Right;  CDE=12.84  . Cataract extraction w/phaco  06/07/2012    Procedure: CATARACT EXTRACTION PHACO AND INTRAOCULAR LENS PLACEMENT (IOC);  Surgeon: Tonny Branch, MD;  Location: [facility];  Service: Ophthalmology;  Laterality: Left;  CDE 17.60  . Carpal tunnel release Left 03/10/2015    Procedure: LEFT CARPAL TUNNEL RELEASE;  Surgeon: Daryll Brod, MD;  Location: [place];  Service: Orthopedics;  Laterality: Left;    There were no vitals filed for this visit.  Visit Diagnosis:  Bilateral low back pain without sciatica      Subjective  Assessment - 04/09/15 1305    Subjective Patient states that she hasn't been standing much today so pain isn't as high. I can go for one week without it being unbearable if I come to therapy.   Currently in Pain? Yes   Pain Score 5    Pain Location Back   Pain Orientation Lower   Pain Onset More than a month ago   Pain Frequency Constant   Aggravating Factors  prolonged standing   Pain Relieving Factors traction   Effect of Pain on Daily Activities painful                         [facility] Adult PT Treatment/Exercise - 04/09/15 0001    Lumbar Exercises: Supine   Bent Knee Raise --  up/up/down/down 10 each side   Bridge --  10 second hold 1 x 6, 1 x 4   Bridge Limitations had to shorten sets due to knee pain.   Straight Leg Raise Other (comment)  x25 reps bilaterally   Other Supine Lumbar Exercises table top position bicycle x 15; and alt leg press x 6 ea   Modalities   Modalities Traction   Traction   Type of Traction Lumbar   Min (lbs) 5   Max (lbs) 80  Hold Time 99   Rest Time 5   Time 18                  PT Short Term Goals - 01/15/15 1055    PT SHORT TERM GOAL #1   Title Ind with a HEP.   Time 2   Period Weeks   Status Achieved           PT Long Term Goals - 04/09/15 1307    PT LONG TERM GOAL #1   Title Perform ADL's with pain not > 3/10.   Baseline 5-6/10   Time 6   Period Weeks   Status On-going   PT LONG TERM GOAL #2   Title Stand 45 minutes with pain not > 3-4/10.   Baseline 30-45 min at 09/04/08   Time 6   Period Weeks   Status On-going               Plan - 04/09/15 1338    Clinical Impression Statement Patient did very well today with advancement of core exercises. No reports of pain in back although her knees hurt with prolonged hold of bridging. Goals are ongoing.    Pt will benefit from skilled therapeutic intervention in order to improve on the following deficits Pain;Decreased activity tolerance   Rehab  Potential Good   Clinical Impairments Affecting Rehab Potential patient weight 135#   PT Frequency 2x / week   PT Duration 6 weeks   PT Treatment/Interventions Traction;Moist Heat;Therapeutic exercise;Manual techniques   PT Next Visit Plan continue core strengthening; add prone if tolerated, continue traction. KX modifier   Consulted and Agree with Plan of Care Patient        Problem List Patient Active Problem List   Diagnosis Date Noted  . Osteopenia of the elderly 02/19/2014  . DDD (degenerative disc disease), lumbar 12/03/2013  . Scoliosis 12/03/2013  . Chronic LBP 05/14/2013  . IBS (irritable bowel syndrome) 11/08/2012  . UC (ulcerative colitis) ([place]) 04/23/2012  . Palpitation 10/26/2011  . Hypertension 10/26/2011  . Chronic ulcerative colitis ([place]) 03/23/2011    Madelyn Flavors PT  04/09/2015, 4:21 PM  [facility] [address] [place], [place], [address] Phone: [phone]   Fax:  [phone]  Name: Lori Soto MRN: [id number] Date of Birth: 09/12/1935

## 2015-04-16 ENCOUNTER — Ambulatory Visit: Payer: Medicare Other | Admitting: Physical Therapy

## 2015-04-16 DIAGNOSIS — M545 Low back pain, unspecified: Secondary | ICD-10-CM

## 2015-04-16 DIAGNOSIS — M546 Pain in thoracic spine: Secondary | ICD-10-CM

## 2015-04-16 NOTE — Therapy (Signed)
[facility] [place], [place], [address] Phone: [phone]   Fax:  [phone]  Physical Therapy Treatment  Patient Details  Name: Lori Soto MRN: [id number] Date of Birth: Feb 22, 1936 Referring Provider: Dr. Laurance Flatten Dr. Brooks/ Dr. Nelva Bush  Encounter Date: 04/16/2015      PT End of Session - 04/16/15 1454    Visit Number 16   Number of Visits 24   Date for PT Re-Evaluation 04/14/15   PT Start Time 0148   PT Stop Time 0238   PT Time Calculation (min) 50 min   Activity Tolerance Patient tolerated treatment well   Behavior During Therapy [facility] for tasks assessed/performed      Past Medical History  Diagnosis Date  . Ulcerative colitis   . Shingles   . Cataract   . H/O hiatal hernia   . Cancer ([place])     skin cancer on nose  . Anemia   . Scoliosis   . Osteopenia   . Carpal tunnel syndrome, bilateral   . Shingles     Past Surgical History  Procedure Laterality Date  . Colonoscopy    . Upper gastrointestinal endoscopy    . Bunionectomy      Bilateral  . Hernia      Right inguinal  . Abdominal hysterectomy    . Hernia repair  1961    right inguinal hernia  . Cataract extraction w/phaco  05/28/2012    Procedure: CATARACT EXTRACTION PHACO AND INTRAOCULAR LENS PLACEMENT (IOC);  Surgeon: Tonny Branch, MD;  Location: [facility];  Service: Ophthalmology;  Laterality: Right;  CDE=12.84  . Cataract extraction w/phaco  06/07/2012    Procedure: CATARACT EXTRACTION PHACO AND INTRAOCULAR LENS PLACEMENT (IOC);  Surgeon: Tonny Branch, MD;  Location: [facility];  Service: Ophthalmology;  Laterality: Left;  CDE 17.60  . Carpal tunnel release Left 03/10/2015    Procedure: LEFT CARPAL TUNNEL RELEASE;  Surgeon: Daryll Brod, MD;  Location: [place];  Service: Orthopedics;  Laterality: Left;    There were no vitals filed for this visit.  Visit Diagnosis:  Bilateral low back pain without sciatica  Bilateral thoracic back  pain      Subjective Assessment - 04/16/15 1452    Subjective Doing better overall.   Pain Score 4    Pain Location Back   Pain Orientation Lower   Pain Descriptors / Indicators Aching   Pain Onset More than a month ago                         [facility] Adult PT Treatment/Exercise - 04/16/15 0001    Traction   Type of Traction Lumbar   Min (lbs) 5   Max (lbs) 85   Hold Time 99   Rest Time 5   Time 15   Manual Therapy   Manual therapy comments Prone over one pillow while patient receibved STW/M especially to her right QL today which was her CC today x 24 minutes.                  PT Short Term Goals - 01/15/15 1055    PT SHORT TERM GOAL #1   Title Ind with a HEP.   Time 2   Period Weeks   Status Achieved           PT Long Term Goals - 04/09/15 1307    PT LONG TERM GOAL #1   Title Perform ADL's with  pain not > 3/10.   Baseline 5-6/10   Time 6   Period Weeks   Status On-going   PT LONG TERM GOAL #2   Title Stand 45 minutes with pain not > 3-4/10.   Baseline 30-45 min at 09/04/08   Time 6   Period Weeks   Status On-going               Plan - 04/16/15 1455    PT Next Visit Plan continue core strengthening; add prone if tolerated, continue traction. KX modifier        Problem List Patient Active Problem List   Diagnosis Date Noted  . Osteopenia of the elderly 02/19/2014  . DDD (degenerative disc disease), lumbar 12/03/2013  . Scoliosis 12/03/2013  . Chronic LBP 05/14/2013  . IBS (irritable bowel syndrome) 11/08/2012  . UC (ulcerative colitis) ([place]) 04/23/2012  . Palpitation 10/26/2011  . Hypertension 10/26/2011  . Chronic ulcerative colitis ([place]) 03/23/2011    Carin Shipp, [place] MPT 04/16/2015, 2:56 PM  [facility] [address] [place], [place], [address] Phone: [phone]   Fax:  [phone]  Name: Lori Soto MRN: [id number] Date of Birth: 09-Jul-1935

## 2015-04-21 ENCOUNTER — Ambulatory Visit: Payer: Medicare Other | Admitting: Physical Therapy

## 2015-04-21 ENCOUNTER — Encounter: Payer: Self-pay | Admitting: Physical Therapy

## 2015-04-21 DIAGNOSIS — M545 Low back pain, unspecified: Secondary | ICD-10-CM

## 2015-04-21 DIAGNOSIS — M546 Pain in thoracic spine: Secondary | ICD-10-CM | POA: Diagnosis not present

## 2015-04-21 NOTE — Therapy (Addendum)
[facility] [place], [place], [address] Phone: [phone]   Fax:  [phone]  Physical Therapy Treatment  Patient Details  Name: Lori Soto MRN: [id number] Date of Birth: Feb 28, 1936 Referring Provider: Dr. Laurance Flatten Dr. Brooks/ Dr. Nelva Bush  Encounter Date: 04/21/2015      PT End of Session - 04/21/15 1528    Visit Number 17   Number of Visits 24   Date for PT Re-Evaluation 04/14/15   PT Start Time 1528   PT Stop Time 1629   PT Time Calculation (min) 61 min   Activity Tolerance Patient tolerated treatment well   Behavior During Therapy [facility] for tasks assessed/performed      Past Medical History  Diagnosis Date  . Ulcerative colitis   . Shingles   . Cataract   . H/O hiatal hernia   . Cancer ([place])     skin cancer on nose  . Anemia   . Scoliosis   . Osteopenia   . Carpal tunnel syndrome, bilateral   . Shingles     Past Surgical History  Procedure Laterality Date  . Colonoscopy    . Upper gastrointestinal endoscopy    . Bunionectomy      Bilateral  . Hernia      Right inguinal  . Abdominal hysterectomy    . Hernia repair  1961    right inguinal hernia  . Cataract extraction w/phaco  05/28/2012    Procedure: CATARACT EXTRACTION PHACO AND INTRAOCULAR LENS PLACEMENT (IOC);  Surgeon: Tonny Branch, MD;  Location: [facility];  Service: Ophthalmology;  Laterality: Right;  CDE=12.84  . Cataract extraction w/phaco  06/07/2012    Procedure: CATARACT EXTRACTION PHACO AND INTRAOCULAR LENS PLACEMENT (IOC);  Surgeon: Tonny Branch, MD;  Location: [facility];  Service: Ophthalmology;  Laterality: Left;  CDE 17.60  . Carpal tunnel release Left 03/10/2015    Procedure: LEFT CARPAL TUNNEL RELEASE;  Surgeon: Daryll Brod, MD;  Location: [place];  Service: Orthopedics;  Laterality: Left;    There were no vitals filed for this visit.  Visit Diagnosis:  Bilateral low back pain without sciatica  Bilateral thoracic back  pain      Subjective Assessment - 04/21/15 1529    Subjective Reports she is tired today and has been doing a lot of running lately. Did baking over the weekend and had to sit to complete baking.   Currently in Pain? Yes   Pain Score 10-Worst pain ever   Pain Location Back   Pain Orientation Lower   Pain Descriptors / Indicators Aching   Pain Type Chronic pain   Pain Onset More than a month ago   Pain Frequency Constant            [facility] PT Assessment - 04/21/15 0001    Assessment   Medical Diagnosis Back pain.                     [facility] Adult PT Treatment/Exercise - 04/21/15 0001    Lumbar Exercises: Stretches   Single Knee to Chest Stretch 5 reps;10 seconds  Bilaterally   Double Knee to Chest Stretch 1 rep;20 seconds   Lumbar Exercises: Supine   Clam Other (comment)  x30 reps   Bent Knee Raise 20 reps;Other (comment)  Bilaterally   Bridge 10 reps;Other (comment)  Reported pain   Straight Leg Raise 20 reps;Other (comment)  x20 reps   Other Supine Lumbar Exercises table top position bicycle 2x20 reps;  Other Supine Lumbar Exercises Supine leg press x15 reps B   Modalities   Modalities Electrical Stimulation;Moist Heat;Traction   Moist Heat Therapy   Number Minutes Moist Heat 15 Minutes   Moist Heat Location Lumbar Spine   Electrical Stimulation   Electrical Stimulation Location B low back   Electrical Stimulation Action Pre-Mod   Electrical Stimulation Parameters 80-150 Hz x15 min   Electrical Stimulation Goals Pain   Traction   Type of Traction Lumbar   Min (lbs) 5   Max (lbs) 85   Hold Time 99   Rest Time 5   Time 15                  PT Short Term Goals - 01/15/15 1055    PT SHORT TERM GOAL #1   Title Ind with a HEP.   Time 2   Period Weeks   Status Achieved           PT Long Term Goals - 04/09/15 1307    PT LONG TERM GOAL #1   Title Perform ADL's with pain not > 3/10.   Baseline 5-6/10   Time 6   Period Weeks    Status On-going   PT LONG TERM GOAL #2   Title Stand 45 minutes with pain not > 3-4/10.   Baseline 30-45 min at 09/04/08   Time 6   Period Weeks   Status On-going               Plan - 04/21/15 1557    Clinical Impression Statement Patient tolerated today's treatment fairly well although she had noted fatigue and pain today. Bridging was painful per patient report and patient noted that her knees felt tight. Goals remain on-going at this time secondary to pain. Normal modalities response noted following removal of the modalities.    Pt will benefit from skilled therapeutic intervention in order to improve on the following deficits Pain;Decreased activity tolerance   Rehab Potential Good   Clinical Impairments Affecting Rehab Potential patient weight 135#   PT Frequency 2x / week   PT Duration 6 weeks   PT Treatment/Interventions Traction;Moist Heat;Therapeutic exercise;Manual techniques;Electrical Stimulation  Electrical stimulation added to POC per MPT co-sign   PT Next Visit Plan continue core strengthening; add prone if tolerated, continue traction. KX modifier   Consulted and Agree with Plan of Care Patient        Problem List Patient Active Problem List   Diagnosis Date Noted  . Osteopenia of the elderly 02/19/2014  . DDD (degenerative disc disease), lumbar 12/03/2013  . Scoliosis 12/03/2013  . Chronic LBP 05/14/2013  . IBS (irritable bowel syndrome) 11/08/2012  . UC (ulcerative colitis) ([place]) 04/23/2012  . Palpitation 10/26/2011  . Hypertension 10/26/2011  . Chronic ulcerative colitis ([place]) 03/23/2011    Lori Soto, PTA 04/21/2015, 4:36 PM  [facility] [address] [place], [place], [address] Phone: [phone]   Fax:  [phone]  Name: Lori Soto MRN: [id number] Date of Birth: [birth year]

## 2015-04-30 ENCOUNTER — Encounter: Payer: Self-pay | Admitting: Physical Therapy

## 2015-04-30 ENCOUNTER — Ambulatory Visit: Payer: Medicare Other | Admitting: Physical Therapy

## 2015-04-30 DIAGNOSIS — M545 Low back pain, unspecified: Secondary | ICD-10-CM

## 2015-04-30 DIAGNOSIS — M546 Pain in thoracic spine: Secondary | ICD-10-CM

## 2015-04-30 NOTE — Therapy (Signed)
[facility] [place], [place], [address] Phone: [phone]   Fax:  [phone]  Physical Therapy Treatment  Patient Details  Name: Lori Soto MRN: [id number] Date of Birth: 11/09/1935 Referring Provider: Dr. Laurance Flatten Dr. Brooks/ Dr. Nelva Bush  Encounter Date: 04/30/2015      PT End of Session - 04/30/15 1305    Visit Number 18   Number of Visits 24   Date for PT Re-Evaluation 05/05/15   PT Start Time 1306   PT Stop Time 1357   PT Time Calculation (min) 51 min      Past Medical History  Diagnosis Date  . Ulcerative colitis   . Shingles   . Cataract   . H/O hiatal hernia   . Cancer ([place])     skin cancer on nose  . Anemia   . Scoliosis   . Osteopenia   . Carpal tunnel syndrome, bilateral   . Shingles     Past Surgical History  Procedure Laterality Date  . Colonoscopy    . Upper gastrointestinal endoscopy    . Bunionectomy      Bilateral  . Hernia      Right inguinal  . Abdominal hysterectomy    . Hernia repair  1961    right inguinal hernia  . Cataract extraction w/phaco  05/28/2012    Procedure: CATARACT EXTRACTION PHACO AND INTRAOCULAR LENS PLACEMENT (IOC);  Surgeon: Tonny Branch, MD;  Location: [facility];  Service: Ophthalmology;  Laterality: Right;  CDE=12.84  . Cataract extraction w/phaco  06/07/2012    Procedure: CATARACT EXTRACTION PHACO AND INTRAOCULAR LENS PLACEMENT (IOC);  Surgeon: Tonny Branch, MD;  Location: [facility];  Service: Ophthalmology;  Laterality: Left;  CDE 17.60  . Carpal tunnel release Left 03/10/2015    Procedure: LEFT CARPAL TUNNEL RELEASE;  Surgeon: Daryll Brod, MD;  Location: [place];  Service: Orthopedics;  Laterality: Left;    There were no vitals filed for this visit.  Visit Diagnosis:  Bilateral low back pain without sciatica  Bilateral thoracic back pain      Subjective Assessment - 04/30/15 1304    Subjective Reports that her back throbs at this time. Has  been busy preparing for family Christmas get together.   Limitations Standing   How long can you stand comfortably? 30 minutes   Currently in Pain? Yes   Pain Score 10-Worst pain ever   Pain Location Back   Pain Orientation Lower   Pain Descriptors / Indicators Throbbing;Tightness   Pain Type Chronic pain   Pain Onset More than a month ago            [facility] PT Assessment - 04/30/15 0001    Assessment   Medical Diagnosis Back pain.                     [facility] Adult PT Treatment/Exercise - 04/30/15 0001    Modalities   Modalities Ultrasound;Traction   Ultrasound   Ultrasound Location B low back   Ultrasound Parameters 1.5 w/cm2, 100%, 1 mhz x10 min   Ultrasound Goals Pain   Traction   Type of Traction Lumbar   Min (lbs) 5   Max (lbs) 85   Hold Time 99   Rest Time 5   Time 15   Manual Therapy   Manual Therapy Myofascial release   Myofascial Release MFR to B lumbar paraspinals and QL and also posterior hip musculature to decrease pain and tightness  PT Short Term Goals - 01/15/15 1055    PT SHORT TERM GOAL #1   Title Ind with a HEP.   Time 2   Period Weeks   Status Achieved           PT Long Term Goals - 04/09/15 1307    PT LONG TERM GOAL #1   Title Perform ADL's with pain not > 3/10.   Baseline 5-6/10   Time 6   Period Weeks   Status On-going   PT LONG TERM GOAL #2   Title Stand 45 minutes with pain not > 3-4/10.   Baseline 30-45 min at 09/04/08   Time 6   Period Weeks   Status On-going               Plan - 04/30/15 1350    Clinical Impression Statement Patient presented today with increased low back pain and stooped posture due to the lumbar pain. Presented with increased R lumbar paraspinals tightness and QL tightness. Manual therapy completed bilaterally today although predominately utilized for the R low back musculature. Following manual therapy patient experienced decreased tightness and improved  posture. Normal modalities response noted following removal of the modalities. Patient denied pain following today's treatment and was encouraged to stretch  R low back.   Pt will benefit from skilled therapeutic intervention in order to improve on the following deficits Pain;Decreased activity tolerance   Rehab Potential Good   Clinical Impairments Affecting Rehab Potential patient weight 135#   PT Frequency 2x / week   PT Duration 6 weeks   PT Treatment/Interventions Traction;Moist Heat;Therapeutic exercise;Manual techniques;Electrical Stimulation;Ultrasound  [place] added per MPT co-sign   PT Next Visit Plan continue core strengthening; add prone if tolerated, continue traction. KX modifier   Consulted and Agree with Plan of Care Patient        Problem List Patient Active Problem List   Diagnosis Date Noted  . Osteopenia of the elderly 02/19/2014  . DDD (degenerative disc disease), lumbar 12/03/2013  . Scoliosis 12/03/2013  . Chronic LBP 05/14/2013  . IBS (irritable bowel syndrome) 11/08/2012  . UC (ulcerative colitis) ([place]) 04/23/2012  . Palpitation 10/26/2011  . Hypertension 10/26/2011  . Chronic ulcerative colitis ([place]) 03/23/2011    Wynelle Fanny, PTA 04/30/2015, 2:15 PM  [facility] [address] [place], [place], [address] Phone: [phone]   Fax:  [phone]  Name: Lori Soto MRN: [id number] Date of Birth: [birth year]

## 2015-05-07 ENCOUNTER — Ambulatory Visit: Payer: Medicare Other | Attending: Family Medicine | Admitting: Physical Therapy

## 2015-05-07 ENCOUNTER — Encounter: Payer: Self-pay | Admitting: Physical Therapy

## 2015-05-07 DIAGNOSIS — M546 Pain in thoracic spine: Secondary | ICD-10-CM | POA: Diagnosis not present

## 2015-05-07 DIAGNOSIS — M545 Low back pain, unspecified: Secondary | ICD-10-CM

## 2015-05-07 NOTE — Therapy (Signed)
[facility] [place], [place], [address] Phone: [phone]   Fax:  [phone]  Physical Therapy Treatment  Patient Details  Name: Lori Soto MRN: [id number] Date of Birth: 10-06-1935 Referring Provider: Dr. Laurance Flatten Dr. Brooks/ Dr. Nelva Bush  Encounter Date: 05/07/2015      PT End of Session - 05/07/15 1305    Visit Number 19   Number of Visits 24   Date for PT Re-Evaluation 05/05/15   PT Start Time 1306   PT Stop Time 1406   PT Time Calculation (min) 60 min   Activity Tolerance Patient tolerated treatment well   Behavior During Therapy [facility] for tasks assessed/performed      Past Medical History  Diagnosis Date  . Ulcerative colitis   . Shingles   . Cataract   . H/O hiatal hernia   . Cancer ([place])     skin cancer on nose  . Anemia   . Scoliosis   . Osteopenia   . Carpal tunnel syndrome, bilateral   . Shingles     Past Surgical History  Procedure Laterality Date  . Colonoscopy    . Upper gastrointestinal endoscopy    . Bunionectomy      Bilateral  . Hernia      Right inguinal  . Abdominal hysterectomy    . Hernia repair  1961    right inguinal hernia  . Cataract extraction w/phaco  05/28/2012    Procedure: CATARACT EXTRACTION PHACO AND INTRAOCULAR LENS PLACEMENT (IOC);  Surgeon: Tonny Branch, MD;  [facility]: [facility];  Service: Ophthalmology;  Laterality: Right;  CDE=12.84  . Cataract extraction w/phaco  06/07/2012    Procedure: CATARACT EXTRACTION PHACO AND INTRAOCULAR LENS PLACEMENT (IOC);  Surgeon: Tonny Branch, MD;  Location: [facility];  Service: Ophthalmology;  Laterality: Left;  CDE 17.60  . Carpal tunnel release Left 03/10/2015    Procedure: LEFT CARPAL TUNNEL RELEASE;  Surgeon: Daryll Brod, MD;  Location: [place];  Service: Orthopedics;  Laterality: Left;    There were no vitals filed for this visit.  Visit Diagnosis:  Bilateral low back pain without sciatica  Bilateral thoracic back  pain      Subjective Assessment - 05/07/15 1305    Subjective Reports that she did not experience back pain from last treatment until Sunday afternoon when her family left.   Limitations Standing   How long can you stand comfortably? 30 minutes   Currently in Pain? Yes   Pain Score 5    Pain Location Back   Pain Orientation Lower   Pain Descriptors / Indicators Throbbing   Pain Type Chronic pain   Pain Onset More than a month ago            [facility] PT Assessment - 05/07/15 0001    Assessment   Medical Diagnosis Back pain.                     [facility] Adult PT Treatment/Exercise - 05/07/15 0001    Lumbar Exercises: Stretches   Single Knee to Chest Stretch 3 reps;30 seconds;Other (comment)  BLE   Double Knee to Chest Stretch 3 reps;30 seconds   Lumbar Exercises: Supine   Ab Set 5 seconds;Other (comment)  x25 reps   Clam Other (comment)  3x10 reps   Bent Knee Raise Other (comment)  3x10 reps   Bridge 20 reps   Straight Leg Raise 20 reps  BLE   Modalities   Modalities Traction  Traction   Type of Traction Lumbar   Min (lbs) 5   Max (lbs) 85   Hold Time 99   Rest Time 5   Time 15   Manual Therapy   Manual Therapy Myofascial release   Myofascial Release MFR to R SI joint region and B lumbar paraspinals to decrease tightness in prone                  PT Short Term Goals - 01/15/15 1055    PT SHORT TERM GOAL #1   Title Ind with a HEP.   Time 2   Period Weeks   Status Achieved           PT Long Term Goals - 04/09/15 1307    PT LONG TERM GOAL #1   Title Perform ADL's with pain not > 3/10.   Baseline 5-6/10   Time 6   Period Weeks   Status On-going   PT LONG TERM GOAL #2   Title Stand 45 minutes with pain not > 3-4/10.   Baseline 30-45 min at 09/04/08   Time 6   Period Weeks   Status On-going               Plan - 05/07/15 1426    Clinical Impression Statement Patient presented today with decreased pain than she had in  previous treatment although she still reported throbbing sensation. Tolerated therapeutic exercises well although she required extra time to complete exercises secondary to knee pain. Normal response noted to traction at 85# today. Continued to feel pressure in low back and requested manual therapy to release pressure in low back with good response.    Pt will benefit from skilled therapeutic intervention in order to improve on the following deficits Pain;Decreased activity tolerance   Rehab Potential Good   Clinical Impairments Affecting Rehab Potential patient weight 135#   PT Frequency 2x / week   PT Duration 6 weeks   PT Treatment/Interventions Traction;Moist Heat;Therapeutic exercise;Manual techniques;Electrical Stimulation;Ultrasound   PT Next Visit Plan continue core strengthening; add prone if tolerated, continue traction. KX modifier   Consulted and Agree with Plan of Care Patient        Problem List Patient Active Problem List   Diagnosis Date Noted  . Osteopenia of the elderly 02/19/2014  . DDD (degenerative disc disease), lumbar 12/03/2013  . Scoliosis 12/03/2013  . Chronic LBP 05/14/2013  . IBS (irritable bowel syndrome) 11/08/2012  . UC (ulcerative colitis) ([place]) 04/23/2012  . Palpitation 10/26/2011  . Hypertension 10/26/2011  . Chronic ulcerative colitis ([place]) 03/23/2011    Wynelle Fanny, PTA 05/07/2015, 2:30 PM  [facility] [address] [place], [place], [address] Phone: [phone]   Fax:  [phone]  Name: Lori Soto MRN: [id number] Date of Birth: May 25, 1935

## 2015-05-11 ENCOUNTER — Other Ambulatory Visit (INDEPENDENT_AMBULATORY_CARE_PROVIDER_SITE_OTHER): Payer: Self-pay | Admitting: Internal Medicine

## 2015-05-14 ENCOUNTER — Ambulatory Visit: Payer: Medicare Other | Admitting: *Deleted

## 2015-05-14 DIAGNOSIS — M546 Pain in thoracic spine: Secondary | ICD-10-CM

## 2015-05-14 DIAGNOSIS — M545 Low back pain, unspecified: Secondary | ICD-10-CM

## 2015-05-14 NOTE — Therapy (Signed)
[facility] [place], [place], [address] Phone: [phone]   Fax:  [phone]  Physical Therapy Treatment  Patient Details  Name: Lori Soto MRN: [id number] Date of Birth: August 27, 1935 Referring Provider: Dr. Laurance Flatten Dr. Brooks/ Dr. Nelva Bush  Encounter Date: 05/14/2015      PT End of Session - 05/14/15 1421    Visit Number 20   Number of Visits 24   Date for PT Re-Evaluation 05/05/15   PT Start Time 9675   PT Stop Time 9163  No ex.s  today as per pt   PT Time Calculation (min) 37 min      Past Medical History  Diagnosis Date  . Ulcerative colitis   . Shingles   . Cataract   . H/O hiatal hernia   . Cancer ([place])     skin cancer on nose  . Anemia   . Scoliosis   . Osteopenia   . Carpal tunnel syndrome, bilateral   . Shingles     Past Surgical History  Procedure Laterality Date  . Colonoscopy    . Upper gastrointestinal endoscopy    . Bunionectomy      Bilateral  . Hernia      Right inguinal  . Abdominal hysterectomy    . Hernia repair  1961    right inguinal hernia  . Cataract extraction w/phaco  05/28/2012    Procedure: CATARACT EXTRACTION PHACO AND INTRAOCULAR LENS PLACEMENT (IOC);  Surgeon: Tonny Branch, MD;  Location: [facility];  Service: Ophthalmology;  Laterality: Right;  CDE=12.84  . Cataract extraction w/phaco  06/07/2012    Procedure: CATARACT EXTRACTION PHACO AND INTRAOCULAR LENS PLACEMENT (IOC);  Surgeon: Tonny Branch, MD;  Location: [facility];  Service: Ophthalmology;  Laterality: Left;  CDE 17.60  . Carpal tunnel release Left 03/10/2015    Procedure: LEFT CARPAL TUNNEL RELEASE;  Surgeon: Daryll Brod, MD;  Location: [place];  Service: Orthopedics;  Laterality: Left;    There were no vitals filed for this visit.  Visit Diagnosis:  Bilateral low back pain without sciatica  Bilateral thoracic back pain      Subjective Assessment - 05/14/15 1419    Subjective Reports that she did  not experience back pain from last treatment for several days. Did exs at home, do STW and traction today   Limitations Standing   How long can you stand comfortably? 30 minutes   Currently in Pain? Yes   Pain Score 5    Pain Location Back   Pain Orientation Lower   Pain Descriptors / Indicators Throbbing   Pain Type Chronic pain   Pain Onset More than a month ago   Pain Frequency Constant   Aggravating Factors  prolonged standing   Pain Relieving Factors STW , Traction                         [facility] Adult PT Treatment/Exercise - 05/14/15 0001    Modalities   Modalities Traction   Traction   Type of Traction Lumbar   Min (lbs) 5   Max (lbs) 85   Hold Time 99   Rest Time 5   Time 15   Manual Therapy   Manual Therapy Myofascial release   Myofascial Release MFR to R SI joint region and B lumbar paraspinals to decrease tightness in prone                  PT Short Term  Goals - 01/15/15 1055    PT SHORT TERM GOAL #1   Title Ind with a HEP.   Time 2   Period Weeks   Status Achieved           PT Long Term Goals - 04/09/15 1307    PT LONG TERM GOAL #1   Title Perform ADL's with pain not > 3/10.   Baseline 5-6/10   Time 6   Period Weeks   Status On-going   PT LONG TERM GOAL #2   Title Stand 45 minutes with pain not > 3-4/10.   Baseline 30-45 min at 09/04/08   Time 6   Period Weeks   Status On-going               Plan - 05/14/15 1425    Clinical Impression Statement Pt presented today with increased pain and tightness due to cleaning her house. STW was performed and notable mm tightness was found in Bilateral LB paras and QLs. Pt felt better and had good TPR and STW and did well again with 85 #s of pelvic traction. LTGs NM today still due to pain   Pt will benefit from skilled therapeutic intervention in order to improve on the following deficits Pain;Decreased activity tolerance   Clinical Impairments Affecting Rehab Potential patient  weight 135#   PT Frequency 2x / week   PT Duration 6 weeks   PT Treatment/Interventions Traction;Moist Heat;Therapeutic exercise;Manual techniques;Electrical Stimulation;Ultrasound   PT Next Visit Plan continue core strengthening; add prone if tolerated, continue traction. KX modifier   Consulted and Agree with Plan of Care Patient        Problem List Patient Active Problem List   Diagnosis Date Noted  . Osteopenia of the elderly 02/19/2014  . DDD (degenerative disc disease), lumbar 12/03/2013  . Scoliosis 12/03/2013  . Chronic LBP 05/14/2013  . IBS (irritable bowel syndrome) 11/08/2012  . UC (ulcerative colitis) ([place]) 04/23/2012  . Palpitation 10/26/2011  . Hypertension 10/26/2011  . Chronic ulcerative colitis ([place]) 03/23/2011    Luisa Louk,CHRIS, PTA 05/14/2015, 2:30 PM  [facility] [place], [place], [address] Phone: [phone]   Fax:  [phone]  Name: Lori Soto MRN: [id number] Date of Birth: Jul 19, 1935

## 2015-05-19 ENCOUNTER — Telehealth (INDEPENDENT_AMBULATORY_CARE_PROVIDER_SITE_OTHER): Payer: Self-pay | Admitting: *Deleted

## 2015-05-19 ENCOUNTER — Encounter (INDEPENDENT_AMBULATORY_CARE_PROVIDER_SITE_OTHER): Payer: Self-pay | Admitting: Internal Medicine

## 2015-05-19 ENCOUNTER — Other Ambulatory Visit (INDEPENDENT_AMBULATORY_CARE_PROVIDER_SITE_OTHER): Payer: Self-pay | Admitting: Internal Medicine

## 2015-05-19 ENCOUNTER — Ambulatory Visit (INDEPENDENT_AMBULATORY_CARE_PROVIDER_SITE_OTHER): Payer: Medicare Other | Admitting: Internal Medicine

## 2015-05-19 VITALS — BP 116/70 | HR 68 | Temp 97.4°F | Resp 18 | Ht 63.0 in | Wt 139.5 lb

## 2015-05-19 DIAGNOSIS — K5190 Ulcerative colitis, unspecified, without complications: Secondary | ICD-10-CM

## 2015-05-19 DIAGNOSIS — K5120 Ulcerative (chronic) proctitis without complications: Secondary | ICD-10-CM

## 2015-05-19 DIAGNOSIS — Z1211 Encounter for screening for malignant neoplasm of colon: Secondary | ICD-10-CM

## 2015-05-19 NOTE — Telephone Encounter (Signed)
Patient needs trilyte 

## 2015-05-19 NOTE — Patient Instructions (Signed)
Colonoscopy to be scheduled in March 2017

## 2015-05-19 NOTE — Progress Notes (Signed)
Presenting complaint;  Follow-up for ulcerative colitis.  Subjective:  Patient is 79 year old Caucasian female who was chronic ulcerative colitis and is here for scheduled visit. She was last seen on 11/17/2014. She has no GI complaints. She has one to 2 formed stools per day. She has very good appetite and has gained 4 pounds since her last visit. She denies abdominal pain melena or rectal bleeding or diarrhea. She has discovered that when she eats chocolate she gets constipated. She had decompression for left carpal tunnel on 03/10/2015 and everything is going well. She continues to have joint pain and back pain. She states she has added a few more OTC medications.   Current Medications: Outpatient Encounter Prescriptions as of 05/19/2015  Medication Sig  . ASACOL HD 800 MG TBEC TAKE 2 TABLETS TWICE A DAY  . Bromelains 500 MG TABS Take 500 mg by mouth daily.  . Calcium Carbonate-Vitamin D (CALCIUM 600+D) 600-400 MG-UNIT per tablet Take 1 tablet by mouth every evening.  [name] 500 MG CAPS Take by mouth.  . Coenzyme Q10 (CO Q-10 PO) Take by mouth 2 (two) times daily. With red yeast rice [employer] Brand  . COLLAGEN PO Take 1 tablet by mouth daily. This is known as 1-2-3  . Cyanocobalamin (VITAMIN B 12 PO) Take 1,000 mcg by mouth daily.   [name] estradiol (ESTRACE) 1 MG tablet Take 1 mg by mouth every other day.   . Ginkgo Biloba 40 MG TABS Take 120 mg by mouth daily.    [name] glucosamine-chondroitin 500-400 MG tablet Take 1 tablet by mouth 2 (two) times daily.    [name] Hyaluronic Acid-Vitamin C (HYALURONIC ACID PO) Take 80 mg by mouth daily.  [name] MILK THISTLE PO Take 100 mg by mouth daily.  . Misc Natural Products (BUTCHERS BROOM PO) Take by mouth daily.    . MULTIPLE VITAMINS PO Take by mouth daily. In the mornings  . Nutritional Supplements (GRAPESEED EXTRACT PO) Take by mouth. 60 mg 1 tab a day  . OAT BRAN SOLUBLE PO Take 1 tablet by mouth daily.  [name] OVER THE COUNTER MEDICATION daily. Vision  Gold Luten 66 mg & Billberry 20 mg  . OVER THE COUNTER MEDICATION Alphalipoic Acid 250 mg daily  . POTASSIUM GLUCONATE PO Take 99 mg by mouth daily. This is Chelated Potassium -[employer]ans Pride  . pyridOXINE (VITAMIN B-6) 100 MG tablet Take 100 mg by mouth daily.   . Turmeric 500 MG CAPS Take by mouth. Patient takes  1 in the morning, 1 at lunch and 1 at bedtime.  . Vitamins C E (CRANBERRY CONCENTRATE PO) Take 2,500 mg by mouth daily.   . Zinc 50 MG CAPS Take 25 mg by mouth daily.   . [DISCONTINUED] traMADol (ULTRAM) 50 MG tablet Take 1 tablet (50 mg total) by mouth every 6 (six) hours as needed. (Patient not taking: Reported on 05/19/2015)   No facility-administered encounter medications on file as of 05/19/2015.     Objective: Blood pressure 116/70, pulse 68, temperature 97.4 F (36.3 C), temperature source Oral, resp. rate 18, height 5' 3"  (1.6 m), weight 139 lb 8 oz (63.277 kg). Patient is alert and in no acute distress. Conjunctiva is pink. Sclera is nonicteric Oropharyngeal mucosa is normal. No neck masses or thyromegaly noted. Cardiac exam with regular rhythm normal S1 and S2. No murmur or gallop noted. Lungs are clear to auscultation. Abdomen is symmetrical soft and nontender without organomegaly or masses.  No LE edema or clubbing noted.  Labs/studies Results: Lab data from 12/05/2014  WBC 6.1, H&H 14.0 and 40.3 and platelet count 293K  Serum creatinine 0.65  Bilirubin 0.4, AP 39, AST 21, ALT 17 and albumin 4.4.   Hemoccults negative on 12/10/2014   Assessment:  #1. Ulcerative colitis. She remains in remission. She is tolerating oral mesalamine without any side effects. Renal function and LFTs are normal. Last colonoscopy was in January 2012 and she is therefore due for surveillance colonoscopy.   Plan:  Surveillance colonoscopy to be scheduled within the next 2-3 months. Continue Asacol HD 1600 mg by mouth twice a day. Office visit in one year.

## 2015-05-20 DIAGNOSIS — G5601 Carpal tunnel syndrome, right upper limb: Secondary | ICD-10-CM | POA: Insufficient documentation

## 2015-05-20 DIAGNOSIS — M19049 Primary osteoarthritis, unspecified hand: Secondary | ICD-10-CM | POA: Insufficient documentation

## 2015-05-21 ENCOUNTER — Ambulatory Visit: Payer: Medicare Other | Admitting: Physical Therapy

## 2015-05-21 DIAGNOSIS — M546 Pain in thoracic spine: Secondary | ICD-10-CM | POA: Diagnosis not present

## 2015-05-21 DIAGNOSIS — M545 Low back pain, unspecified: Secondary | ICD-10-CM

## 2015-05-21 NOTE — Therapy (Signed)
[facility] [place], [place], [address] Phone: [phone]   Fax:  [phone]  Physical Therapy Treatment  Patient Details  Name: Lori Soto MRN: [id number] Date of Birth: 1935/12/02 Referring Provider: Dr. Laurance Flatten Dr. Brooks/ Dr. Nelva Bush  Encounter Date: 05/21/2015      PT End of Session - 05/21/15 1738    Visit Number 21   Number of Visits 24   Date for PT Re-Evaluation 05/05/15   PT Start Time 0205   PT Stop Time 0259   PT Time Calculation (min) 54 min      Past Medical History  Diagnosis Date  . Ulcerative colitis   . Shingles   . Cataract   . H/O hiatal hernia   . Cancer ([place])     skin cancer on nose  . Anemia   . Scoliosis   . Osteopenia   . Carpal tunnel syndrome, bilateral   . Shingles     Past Surgical History  Procedure Laterality Date  . Colonoscopy    . Upper gastrointestinal endoscopy    . Bunionectomy      Bilateral  . Hernia      Right inguinal  . Abdominal hysterectomy    . Hernia repair  1961    right inguinal hernia  . Cataract extraction w/phaco  05/28/2012    Procedure: CATARACT EXTRACTION PHACO AND INTRAOCULAR LENS PLACEMENT (IOC);  Surgeon: Tonny Branch, MD;  Location: [facility];  Service: Ophthalmology;  Laterality: Right;  CDE=12.84  . Cataract extraction w/phaco  06/07/2012    Procedure: CATARACT EXTRACTION PHACO AND INTRAOCULAR LENS PLACEMENT (IOC);  Surgeon: Tonny Branch, MD;  Location: [facility];  Service: Ophthalmology;  Laterality: Left;  CDE 17.60  . Carpal tunnel release Left 03/10/2015    Procedure: LEFT CARPAL TUNNEL RELEASE;  Surgeon: Daryll Brod, MD;  Location: [facility];  Service: Orthopedics;  Laterality: Left;    There were no vitals filed for this visit.  Visit Diagnosis:  Bilateral low back pain without sciatica  Bilateral thoracic back pain      Subjective Assessment - 05/21/15 1758    Subjective Traction has really been helping me lately.   Pain Score 3    Pain Location Back   Pain Orientation Lower   Pain Descriptors / Indicators Throbbing   Pain Type Chronic pain   Pain Onset More than a month ago                         [facility] Adult PT Treatment/Exercise - 05/21/15 0001    Traction   Type of Traction Lumbar   Min (lbs) 5   Max (lbs) 85   Hold Time 99   Rest Time 5   Time 15   Manual Therapy   Myofascial Release MFR and STW to affected lumbar musculature and SIJ x 24 minutes.                  PT Short Term Goals - 01/15/15 1055    PT SHORT TERM GOAL #1   Title Ind with a HEP.   Time 2   Period Weeks   Status Achieved           PT Long Term Goals - 04/09/15 1307    PT LONG TERM GOAL #1   Title Perform ADL's with pain not > 3/10.   Baseline 5-6/10   Time 6   Period Weeks   Status  On-going   PT LONG TERM GOAL #2   Title Stand 45 minutes with pain not > 3-4/10.   Baseline 30-45 min at 09/04/08   Time 6   Period Weeks   Status On-going               Problem List Patient Active Problem List   Diagnosis Date Noted  . Osteopenia of the elderly 02/19/2014  . DDD (degenerative disc disease), lumbar 12/03/2013  . Scoliosis 12/03/2013  . Chronic LBP 05/14/2013  . IBS (irritable bowel syndrome) 11/08/2012  . UC (ulcerative colitis) ([place]) 04/23/2012  . Palpitation 10/26/2011  . Hypertension 10/26/2011  . Chronic ulcerative colitis ([place]) 03/23/2011    Remedios Mckone, [place] MPT 05/21/2015, 6:11 PM  [facility] [address] [place], [place], [address] Phone: [phone]   Fax:  [phone]  Name: Lori Soto MRN: [id number] Date of Birth: 08-21-1935

## 2015-05-26 MED ORDER — PEG 3350-KCL-NA BICARB-NACL 420 G PO SOLR: 4000.0000 mL | Freq: Once | ORAL | Status: DC

## 2015-05-28 ENCOUNTER — Ambulatory Visit: Payer: Medicare Other | Admitting: Physical Therapy

## 2015-05-28 ENCOUNTER — Encounter: Payer: Self-pay | Admitting: Physical Therapy

## 2015-05-28 DIAGNOSIS — M545 Low back pain, unspecified: Secondary | ICD-10-CM

## 2015-05-28 DIAGNOSIS — M546 Pain in thoracic spine: Secondary | ICD-10-CM

## 2015-05-28 NOTE — Therapy (Addendum)
[facility] [place], [place], [address] Phone: [phone]   Fax:  [phone]  Physical Therapy Treatment  Patient Details  Name: Lori Soto MRN: [id number] Date of Birth: 06/09/1935 Referring Provider: Dr. Laurance Flatten Dr. Brooks/ Dr. Nelva Bush  Encounter Date: 05/28/2015      PT End of Session - 05/28/15 1426    Visit Number 22   Number of Visits 24   Date for PT Re-Evaluation 05/05/15   PT Start Time 1359   PT Stop Time 1439   PT Time Calculation (min) 40 min   Activity Tolerance Patient tolerated treatment well   Behavior During Therapy [facility] for tasks assessed/performed      Past Medical History  Diagnosis Date  . Ulcerative colitis   . Shingles   . Cataract   . H/O hiatal hernia   . Cancer ([place])     skin cancer on nose  . Anemia   . Scoliosis   . Osteopenia   . Carpal tunnel syndrome, bilateral   . Shingles     Past Surgical History  Procedure Laterality Date  . Colonoscopy    . Upper gastrointestinal endoscopy    . Bunionectomy      Bilateral  . Hernia      Right inguinal  . Abdominal hysterectomy    . Hernia repair  1961    right inguinal hernia  . Cataract extraction w/phaco  05/28/2012    Procedure: CATARACT EXTRACTION PHACO AND INTRAOCULAR LENS PLACEMENT (IOC);  Surgeon: Tonny Branch, MD;  Location: [facility];  Service: Ophthalmology;  Laterality: Right;  CDE=12.84  . Cataract extraction w/phaco  06/07/2012    Procedure: CATARACT EXTRACTION PHACO AND INTRAOCULAR LENS PLACEMENT (IOC);  Surgeon: Tonny Branch, MD;  Location: [facility];  Service: Ophthalmology;  Laterality: Left;  CDE 17.60  . Carpal tunnel release Left 03/10/2015    Procedure: LEFT CARPAL TUNNEL RELEASE;  Surgeon: Daryll Brod, MD;  Location: [facility];  Service: Orthopedics;  Laterality: Left;    There were no vitals filed for this visit.  Visit Diagnosis:  Bilateral low back pain without sciatica  Bilateral thoracic back  pain      Subjective Assessment - 05/28/15 1413    Subjective patient feels pain level at 3-4 and increase to 7/10 with ADL's   Limitations Standing   How long can you stand comfortably? 30 minutes   Currently in Pain? Yes   Pain Score 7    Pain Location Back   Pain Orientation Lower   Pain Descriptors / Indicators Sore   Pain Type Chronic pain   Pain Onset More than a month ago   Aggravating Factors  increased activity   Pain Relieving Factors at rest                         [facility] Adult PT Treatment/Exercise - 05/28/15 0001    Lumbar Exercises: Supine   Ab Set 20 reps  with 10-20 sec hold   Bent Knee Raise --  draw ins 2x10 each LE with holds   Bridge 20 reps;3 seconds   Straight Leg Raise --  draw ins 2x10 each LE with holds   Other Supine Lumbar Exercises seated scap retraction with red tband x20   Traction   Type of Traction Lumbar   Min (lbs) 5   Max (lbs) 85   Hold Time 99   Rest Time 5   Time 15  PT Education - 05/28/15 1426    Education Details HEP and posture techniques   Person(s) Educated Patient   Methods Demonstration;Explanation;Handout;Verbal cues   Comprehension Verbalized understanding;Returned demonstration          PT Short Term Goals - 01/15/15 1055    PT SHORT TERM GOAL #1   Title Ind with a HEP.   Time 2   Period Weeks   Status Achieved           PT Long Term Goals - 05/28/15 1428    PT LONG TERM GOAL #1   Title Perform ADL's with pain not > 3/10.   Baseline 5-6/10   Time 6   Period Weeks   Status Not Met  up to 7/10 with ADL's   PT LONG TERM GOAL #2   Title Stand 45 minutes with pain not > 3-4/10.   Baseline 30-45 min at 09/04/08   Time 6   Period Weeks   Status Not Met  up to 7/10 with standing               Plan - 05/28/15 1429    Clinical Impression Statement Patient has reported improvement overall and is independent with core strengthening exercises and understands  importance of posture awareness techniques to avoid pain in back with standing, sitting, lifting, ADL's and sleeping. FOTO score 53% limitation. LTG's ongoing due to pain deficit.   Pt will benefit from skilled therapeutic intervention in order to improve on the following deficits Pain;Decreased activity tolerance   Rehab Potential Good   Clinical Impairments Affecting Rehab Potential patient weight 135#   PT Frequency 2x / week   PT Duration 6 weeks   PT Treatment/Interventions Traction;Moist Heat;Therapeutic exercise;Manual techniques;Electrical Stimulation;Ultrasound   PT Next Visit Plan DC per MPT    Consulted and Agree with Plan of Care Patient        Problem List Patient Active Problem List   Diagnosis Date Noted  . Osteopenia of the elderly 02/19/2014  . DDD (degenerative disc disease), lumbar 12/03/2013  . Scoliosis 12/03/2013  . Chronic LBP 05/14/2013  . IBS (irritable bowel syndrome) 11/08/2012  . UC (ulcerative colitis) ([place]) 04/23/2012  . Palpitation 10/26/2011  . Hypertension 10/26/2011  . Chronic ulcerative colitis ([place]) 03/23/2011    Anberlyn Feimster P, PTA 05/28/2015, 2:40 PM  Ladean Raya, PTA 05/28/2015 2:40 PM  [facility] [place], [place], [address] Phone: [phone]   Fax:  [phone]  Name: Lori Soto MRN: [id number] Date of Birth: 05-14-1935  PHYSICAL THERAPY DISCHARGE SUMMARY  Visits from Start of Care: 22  Current functional level related to goals / functional outcomes: Please see above.   Remaining deficits: Continued pain.   Education / Equipment: HEP. Plan: Patient agrees to discharge.  Patient goals were partially met. Patient is being discharged due to meeting the stated rehab goals.  ?????

## 2015-05-28 NOTE — Patient Instructions (Signed)
Bridging  Slowly raise buttocks from floor, keeping stomach tight. Repeat _10___ times per set. Do __2__ sets per session. Do __2__ sessions per day.     Scapular Retraction: Bilateral  Facing anchor, pull arms back, bringing shoulder blades together. Repeat _30___ times per set. Do __2-3__ sets per session. Do _2___ sessions per day.

## 2015-06-11 ENCOUNTER — Encounter: Payer: Self-pay | Admitting: Family Medicine

## 2015-06-11 ENCOUNTER — Ambulatory Visit (INDEPENDENT_AMBULATORY_CARE_PROVIDER_SITE_OTHER): Payer: Medicare Other | Admitting: Family Medicine

## 2015-06-11 VITALS — BP 123/77 | HR 62 | Temp 98.0°F | Ht 63.0 in | Wt 139.0 lb

## 2015-06-11 DIAGNOSIS — M899 Disorder of bone, unspecified: Secondary | ICD-10-CM

## 2015-06-11 DIAGNOSIS — R1310 Dysphagia, unspecified: Secondary | ICD-10-CM

## 2015-06-11 DIAGNOSIS — K51918 Ulcerative colitis, unspecified with other complication: Secondary | ICD-10-CM

## 2015-06-11 DIAGNOSIS — G8929 Other chronic pain: Secondary | ICD-10-CM

## 2015-06-11 DIAGNOSIS — M8580 Other specified disorders of bone density and structure, unspecified site: Secondary | ICD-10-CM

## 2015-06-11 DIAGNOSIS — I10 Essential (primary) hypertension: Secondary | ICD-10-CM

## 2015-06-11 DIAGNOSIS — M545 Low back pain, unspecified: Secondary | ICD-10-CM

## 2015-06-11 DIAGNOSIS — E559 Vitamin D deficiency, unspecified: Secondary | ICD-10-CM | POA: Diagnosis not present

## 2015-06-11 NOTE — Patient Instructions (Addendum)
Medicare Annual Wellness Visit  [facility] and the medical providers at [facility] strive to bring you the best medical care.  In doing so we not only want to address your current medical conditions and concerns but also to detect new conditions early and prevent illness, disease and health-related problems.    Medicare offers a yearly Wellness Visit which allows our clinical staff to assess your need for preventative services including immunizations, lifestyle education, counseling to decrease risk of preventable diseases and screening for fall risk and other medical concerns.    This visit is provided free of charge (no copay) for all Medicare recipients. The clinical pharmacists at [facility] have begun to conduct these Wellness Visits which will also include a thorough review of all your medications.    As you primary medical provider recommend that you make an appointment for your Annual Wellness Visit if you have not done so already this year.  You may set up this appointment before you leave today or you may call back ([phone]) and schedule an appointment.  Please make sure when you call that you mention that you are scheduling your Annual Wellness Visit with the clinical pharmacist so that the appointment may be made for the proper length of time.     Continue current medications. Continue good therapeutic lifestyle changes which include good diet and exercise. Fall precautions discussed with patient. If an FOBT was given today- please return it to our front desk. If you are over 80 years old - you may need Prevnar 38 or the adult Pneumonia vaccine.  **Flu shots are available--- please call and schedule a FLU-CLINIC appointment**  After your visit with [place] today you will receive a survey in the mail or online from [employer] regarding your care with [place]. Please take a moment to fill this out. Your feedback is very  important to [place] as you can help [place] better understand your patient needs as well as improve your experience and satisfaction. WE CARE ABOUT YOU!!!   Return to clinic for fasting lab work We will call the gastroenterologist office and see if he would be willing to do an endoscopy at the time he does your colonoscopy because the difficulty you have with swallowing pills. In the meantime pay more attention to the swallowing problem and call us if anything changes in regard to this Continue to be careful and did not put yourself at risk for falling Continue physical therapy because of all the problems you have with your back Please call the end of March to arrange an appointment with orthopedic surgeon regarding your knees if you're knees are still giving you trouble at that time

## 2015-06-11 NOTE — Progress Notes (Signed)
Subjective:    Patient ID: Lori Soto, female    DOB: 08-11-1935, 80 y.o.   MRN: [id number]  HPI Pt here for follow up and management of chronic medical problems which includes hypertension. She is taking medications regularly. The patient today complains of bilateral knee pain and right hip pain and continued back pain. She says that when she swallows her pills the same to get stuck in her esophagus and throat. The patient is somewhat tearful today in recounting the death of her husband 4 years ago. She denies any chest pain or shortness of breath. She does have trouble swallowing mostly pills and not food or water. She does have a colonoscopy scheduled sometime in early March and we will talk to the gastroenterologist to see if he would consider doing an endoscopy at the same time. She has not seen any blood in the stool recently or had any black tarry bowel movements. She denies any heartburn or indigestion. She is passing her water without problems. She does have a lot of problems with her knees and back and with certain positions with her knees she can have some abdominal pain on the left side. This is most likely coming from the severe scoliosis that she has in her back and the arthritis with this.       Patient Active Problem List   Diagnosis Date Noted  . Osteopenia of the elderly 02/19/2014  . DDD (degenerative disc disease), lumbar 12/03/2013  . Scoliosis 12/03/2013  . Chronic LBP 05/14/2013  . IBS (irritable bowel syndrome) 11/08/2012  . UC (ulcerative colitis) ([place]) 04/23/2012  . Palpitation 10/26/2011  . Hypertension 10/26/2011  . Chronic ulcerative colitis ([place]) 03/23/2011   Outpatient Encounter Prescriptions as of 06/11/2015  Medication Sig  . ASACOL HD 800 MG TBEC TAKE 2 TABLETS TWICE A DAY  . Bromelains 500 MG TABS Take 500 mg by mouth daily.  . Calcium Carbonate-Vitamin D (CALCIUM 600+D) 600-400 MG-UNIT per tablet Take 1 tablet by mouth every evening.  [name]  500 MG CAPS Take by mouth.  . Coenzyme Q10 (CO Q-10 PO) Take by mouth 2 (two) times daily. With red yeast rice [employer] Brand  . COLLAGEN PO Take 1 tablet by mouth daily. This is known as 1-2-3  . Cyanocobalamin (VITAMIN B 12 PO) Take 1,000 mcg by mouth daily.   [name] estradiol (ESTRACE) 1 MG tablet Take 1 mg by mouth every other day.   . Ginkgo Biloba 40 MG TABS Take 120 mg by mouth daily.    [name] glucosamine-chondroitin 500-400 MG tablet Take 1 tablet by mouth 2 (two) times daily.    [name] Hyaluronic Acid-Vitamin C (HYALURONIC ACID PO) Take 80 mg by mouth daily.  [name] MILK THISTLE PO Take 100 mg by mouth daily.  . Misc Natural Products (BUTCHERS BROOM PO) Take by mouth daily.    . MULTIPLE VITAMINS PO Take by mouth daily. In the mornings  . Nutritional Supplements (GRAPESEED EXTRACT PO) Take by mouth. 60 mg 1 tab a day  . OAT BRAN SOLUBLE PO Take 1 tablet by mouth daily.  [name] OVER THE COUNTER MEDICATION daily. Vision Gold Luten 66 mg & Billberry 20 mg  . OVER THE COUNTER MEDICATION Alphalipoic Acid 250 mg daily  . polyethylene glycol-electrolytes (NULYTELY/GOLYTELY) 420 g solution Take 4,000 mLs by mouth once.  [name] POTASSIUM GLUCONATE PO Take 99 mg by mouth daily. This is Chelated Potassium -[employer] Pride  . pyridOXINE (VITAMIN B-6) 100 MG tablet Take 100  mg by mouth daily.   . Turmeric 500 MG CAPS Take by mouth. Patient takes  1 in the morning, 1 at lunch and 1 at bedtime.  . Vitamins C E (CRANBERRY CONCENTRATE PO) Take 2,500 mg by mouth daily.   . Zinc 50 MG CAPS Take 25 mg by mouth daily.    No facility-administered encounter medications on file as of 06/11/2015.     Review of Systems  Constitutional: Negative.   HENT: Positive for trouble swallowing (when taking medications).   Eyes: Negative.   Respiratory: Negative.   Cardiovascular: Negative.   Gastrointestinal: Negative.   Endocrine: Negative.   Genitourinary: Negative.   Musculoskeletal: Positive for back pain and arthralgias  (bilateral knee pain and some right hip pain).  Skin: Negative.   Allergic/Immunologic: Negative.   Neurological: Negative.   Hematological: Negative.   Psychiatric/Behavioral: Negative.        Objective:   Physical Exam  Constitutional: She is oriented to person, place, and time. She appears well-developed and well-nourished. She appears distressed.  HENT:  Head: Normocephalic and atraumatic.  Right Ear: External ear normal.  Left Ear: External ear normal.  Nose: Nose normal.  Mouth/Throat: Oropharynx is clear and moist.  Eyes: Conjunctivae and EOM are normal. Pupils are equal, round, and reactive to light. Right eye exhibits no discharge. Left eye exhibits no discharge. No scleral icterus.  Neck: Normal range of motion. Neck supple. No JVD present. No thyromegaly present.  Cardiovascular: Normal rate, regular rhythm, normal heart sounds and intact distal pulses.   No murmur heard. Pulmonary/Chest: Effort normal and breath sounds normal. No respiratory distress. She has no wheezes. She has no rales.  Clear anteriorly and posteriorly  Abdominal: Soft. Bowel sounds are normal. She exhibits no mass. There is no tenderness. There is no rebound and no guarding.  Musculoskeletal: She exhibits no edema or tenderness.  The patient has a kyphotic posture. She has trouble laying down supine.  Lymphadenopathy:    She has no cervical adenopathy.  Neurological: She is alert and oriented to person, place, and time. She has normal reflexes. No cranial nerve deficit.  Skin: Skin is warm and dry. No rash noted.  Psychiatric: She has a normal mood and affect. Her behavior is normal. Judgment and thought content normal.  Nursing note and vitals reviewed.  BP 123/77 mmHg  Pulse 62  Temp(Src) 98 F (36.7 C) (Oral)  Ht 5' 3"  (1.6 m)  Wt 139 lb (63.05 kg)  BMI 24.63 kg/m2        Assessment & Plan:  1. Essential hypertension -The blood pressure is good today and she will continue with  current treatment - BMP8+EGFR; Future - CBC with Differential/Platelet; Future - Hepatic function panel; Future - NMR, lipoprofile; Future  2. Vitamin D deficiency -Continue with current treatment pending results of lab work - CBC with Differential/Platelet; Future - VITAMIN D 25 Hydroxy (Vit-D Deficiency, Fractures); Future  3. Ulcerative colitis with other complication, unspecified location [facility]) -Follow-up with gastroenterology as planned  4. Osteopenia of the elderly -Continue to be careful do not put yourself at risk for falling and take calcium and vitamin D as directed  5. Chronic LBP -Continue to be careful do not put yourself at risk for falling   6. Trouble swallowing -Consider getting an endoscopy with her colonoscopy--we will discuss this with gastroenterology office - Ambulatory referral to Gastroenterology  Patient Instructions  Medicare Annual Wellness Visit  [facility] and the medical providers at [facility] strive to bring you the best medical care.  In doing so we not only want to address your current medical conditions and concerns but also to detect new conditions early and prevent illness, disease and health-related problems.    Medicare offers a yearly Wellness Visit which allows our clinical staff to assess your need for preventative services including immunizations, lifestyle education, counseling to decrease risk of preventable diseases and screening for fall risk and other medical concerns.    This visit is provided free of charge (no copay) for all Medicare recipients. The clinical pharmacists at [facility] have begun to conduct these Wellness Visits which will also include a thorough review of all your medications.    As you primary medical provider recommend that you make an appointment for your Annual Wellness Visit if you have not done so already this year.  You may set up this  appointment before you leave today or you may call back ([phone]) and schedule an appointment.  Please make sure when you call that you mention that you are scheduling your Annual Wellness Visit with the clinical pharmacist so that the appointment may be made for the proper length of time.     Continue current medications. Continue good therapeutic lifestyle changes which include good diet and exercise. Fall precautions discussed with patient. If an FOBT was given today- please return it to our front desk. If you are over 10 years old - you may need Prevnar 22 or the adult Pneumonia vaccine.  **Flu shots are available--- please call and schedule a FLU-CLINIC appointment**  After your visit with [place] today you will receive a survey in the mail or online from [employer] regarding your care with [place]. Please take a moment to fill this out. Your feedback is very important to [place] as you can help [place] better understand your patient needs as well as improve your experience and satisfaction. WE CARE ABOUT YOU!!!   Return to clinic for fasting lab work We will call the gastroenterologist office and see if he would be willing to do an endoscopy at the time he does your colonoscopy because the difficulty you have with swallowing pills. In the meantime pay more attention to the swallowing problem and call us if anything changes in regard to this Continue to be careful and did not put yourself at risk for falling Continue physical therapy because of all the problems you have with your back Please call the end of March to arrange an appointment with orthopedic surgeon regarding your knees if you're knees are still giving you trouble at that time   Arrie Senate MD

## 2015-06-15 ENCOUNTER — Telehealth (INDEPENDENT_AMBULATORY_CARE_PROVIDER_SITE_OTHER): Payer: Self-pay | Admitting: *Deleted

## 2015-06-15 NOTE — Telephone Encounter (Signed)
We rec'd referral from [place] for patient to have EGD/ED, patient having trouble swallowing pills, NOT food or liquids -- do you want patient to have esophagram first or sch'd EGD/ED -- please advise

## 2015-06-16 NOTE — Telephone Encounter (Signed)
OK to schedule EGD/ED at the time of TCS

## 2015-06-17 ENCOUNTER — Other Ambulatory Visit (INDEPENDENT_AMBULATORY_CARE_PROVIDER_SITE_OTHER): Payer: Self-pay | Admitting: *Deleted

## 2015-06-17 DIAGNOSIS — K51818 Other ulcerative colitis with other complication: Secondary | ICD-10-CM

## 2015-06-17 DIAGNOSIS — R1310 Dysphagia, unspecified: Secondary | ICD-10-CM

## 2015-06-17 NOTE — Telephone Encounter (Signed)
EGD/ED added to TCS, patient aware

## 2015-06-20 ENCOUNTER — Other Ambulatory Visit: Payer: Medicare Other

## 2015-06-20 DIAGNOSIS — I10 Essential (primary) hypertension: Secondary | ICD-10-CM | POA: Diagnosis not present

## 2015-06-20 DIAGNOSIS — E559 Vitamin D deficiency, unspecified: Secondary | ICD-10-CM | POA: Diagnosis not present

## 2015-06-22 LAB — NMR, LIPOPROFILE
CHOLESTEROL: 191 mg/dL (ref 100–199)
HDL CHOLESTEROL BY NMR: 73 mg/dL (ref >39)
HDL Particle Number: 36.6 umol/L (ref >=30.5)
LDL PARTICLE NUMBER: 1482 nmol/L — AB (ref <1000)
LDL SIZE: 21.3 nm (ref >20.5)
LDL-C: 109 mg/dL — ABNORMAL HIGH (ref 0–99)
LP-IR Score: <25 (ref <=45)
Small LDL Particle Number: 436 nmol/L (ref <=527)
TRIGLYCERIDES BY NMR: 45 mg/dL (ref 0–149)

## 2015-06-22 LAB — CBC WITH DIFFERENTIAL/PLATELET
BASOS ABS: 0 10*3/uL (ref 0.0–0.2)
Basos: 1 %
EOS (ABSOLUTE): 0.1 10*3/uL (ref 0.0–0.4)
Eos: 2 %
HEMOGLOBIN: 13.5 g/dL (ref 11.1–15.9)
Hematocrit: 41 % (ref 34.0–46.6)
IMMATURE GRANS (ABS): 0 10*3/uL (ref 0.0–0.1)
Immature Granulocytes: 0 %
LYMPHS: 38 %
Lymphocytes Absolute: 1.6 10*3/uL (ref 0.7–3.1)
MCH: 29.7 pg (ref 26.6–33.0)
MCHC: 32.9 g/dL (ref 31.5–35.7)
MCV: 90 fL (ref 79–97)
MONOCYTES: 9 %
Monocytes Absolute: 0.4 10*3/uL (ref 0.1–0.9)
Neutrophils Absolute: 2.1 10*3/uL (ref 1.4–7.0)
Neutrophils: 50 %
PLATELETS: 283 10*3/uL (ref 150–379)
RBC: 4.55 x10E6/uL (ref 3.77–5.28)
RDW: 14 % (ref 12.3–15.4)
WBC: 4.2 10*3/uL (ref 3.4–10.8)

## 2015-06-22 LAB — BMP8+EGFR
BUN/Creatinine Ratio: 15 (ref 11–26)
BUN: 11 mg/dL (ref 8–27)
CALCIUM: 9.4 mg/dL (ref 8.7–10.3)
CHLORIDE: 103 mmol/L (ref 96–106)
CO2: 25 mmol/L (ref 18–29)
Creatinine, Ser: 0.71 mg/dL (ref 0.57–1.00)
GFR calc Af Amer: 95 mL/min/{1.73_m2} (ref >59)
GFR calc non Af Amer: 82 mL/min/{1.73_m2} (ref >59)
GLUCOSE: 77 mg/dL (ref 65–99)
POTASSIUM: 4.6 mmol/L (ref 3.5–5.2)
Sodium: 144 mmol/L (ref 134–144)

## 2015-06-22 LAB — HEPATIC FUNCTION PANEL
ALBUMIN: 4.2 g/dL (ref 3.5–4.8)
ALK PHOS: 34 IU/L — AB (ref 39–117)
ALT: 15 IU/L (ref 0–32)
AST: 21 IU/L (ref 0–40)
BILIRUBIN, DIRECT: 0.14 mg/dL (ref 0.00–0.40)
Bilirubin Total: 0.4 mg/dL (ref 0.0–1.2)
TOTAL PROTEIN: 6.7 g/dL (ref 6.0–8.5)

## 2015-06-22 LAB — VITAMIN D 25 HYDROXY (VIT D DEFICIENCY, FRACTURES): VIT D 25 HYDROXY: 41.1 ng/mL (ref 30.0–100.0)

## 2015-06-23 ENCOUNTER — Encounter: Payer: Self-pay | Admitting: *Deleted

## 2015-06-30 ENCOUNTER — Telehealth: Payer: Self-pay | Admitting: Family Medicine

## 2015-07-08 ENCOUNTER — Ambulatory Visit (HOSPITAL_COMMUNITY)
Admission: RE | Admit: 2015-07-08 | Discharge: 2015-07-08 | Disposition: A | Discharge status: Home / Self Care | Payer: Medicare Other | Source: Ambulatory Visit | Attending: Internal Medicine | Admitting: Internal Medicine

## 2015-07-08 ENCOUNTER — Encounter (HOSPITAL_COMMUNITY)
Admission: RE | Disposition: A | Discharge status: Home / Self Care | Payer: Self-pay | Source: Ambulatory Visit | Attending: Internal Medicine

## 2015-07-08 ENCOUNTER — Encounter (HOSPITAL_COMMUNITY): Payer: Self-pay | Admitting: *Deleted

## 2015-07-08 DIAGNOSIS — Z1211 Encounter for screening for malignant neoplasm of colon: Secondary | ICD-10-CM | POA: Insufficient documentation

## 2015-07-08 DIAGNOSIS — K5190 Ulcerative colitis, unspecified, without complications: Secondary | ICD-10-CM | POA: Diagnosis not present

## 2015-07-08 DIAGNOSIS — Z801 Family history of malignant neoplasm of trachea, bronchus and lung: Secondary | ICD-10-CM | POA: Diagnosis not present

## 2015-07-08 DIAGNOSIS — K449 Diaphragmatic hernia without obstruction or gangrene: Secondary | ICD-10-CM | POA: Insufficient documentation

## 2015-07-08 DIAGNOSIS — K5730 Diverticulosis of large intestine without perforation or abscess without bleeding: Secondary | ICD-10-CM | POA: Insufficient documentation

## 2015-07-08 DIAGNOSIS — Z79899 Other long term (current) drug therapy: Secondary | ICD-10-CM | POA: Diagnosis not present

## 2015-07-08 DIAGNOSIS — R1310 Dysphagia, unspecified: Secondary | ICD-10-CM | POA: Diagnosis not present

## 2015-07-08 DIAGNOSIS — K51818 Other ulcerative colitis with other complication: Secondary | ICD-10-CM

## 2015-07-08 DIAGNOSIS — D125 Benign neoplasm of sigmoid colon: Secondary | ICD-10-CM | POA: Insufficient documentation

## 2015-07-08 DIAGNOSIS — D120 Benign neoplasm of cecum: Secondary | ICD-10-CM | POA: Diagnosis not present

## 2015-07-08 DIAGNOSIS — Z85828 Personal history of other malignant neoplasm of skin: Secondary | ICD-10-CM | POA: Diagnosis not present

## 2015-07-08 DIAGNOSIS — K5100 Ulcerative (chronic) pancolitis without complications: Secondary | ICD-10-CM | POA: Diagnosis not present

## 2015-07-08 DIAGNOSIS — K222 Esophageal obstruction: Secondary | ICD-10-CM | POA: Insufficient documentation

## 2015-07-08 DIAGNOSIS — Z8042 Family history of malignant neoplasm of prostate: Secondary | ICD-10-CM | POA: Diagnosis not present

## 2015-07-08 DIAGNOSIS — Z800 Family history of malignant neoplasm of digestive organs: Secondary | ICD-10-CM | POA: Diagnosis not present

## 2015-07-08 DIAGNOSIS — K209 Esophagitis, unspecified: Secondary | ICD-10-CM | POA: Diagnosis not present

## 2015-07-08 HISTORY — PX: ESOPHAGOGASTRODUODENOSCOPY: SHX5428

## 2015-07-08 HISTORY — PX: ESOPHAGEAL DILATION: SHX303

## 2015-07-08 HISTORY — PX: COLONOSCOPY: SHX5424

## 2015-07-08 SURGERY — COLONOSCOPY
Anesthesia: Moderate Sedation

## 2015-07-08 MED ORDER — STERILE WATER FOR IRRIGATION IR SOLN
Status: DC | PRN
  Administered 2015-07-08: 10:00:00

## 2015-07-08 MED ORDER — SODIUM CHLORIDE 0.9 % IV SOLN
INTRAVENOUS | Status: DC
  Administered 2015-07-08: 09:00:00 via INTRAVENOUS

## 2015-07-08 MED ORDER — MEPERIDINE HCL 50 MG/ML IJ SOLN
INTRAMUSCULAR | Status: DC | PRN
  Administered 2015-07-08 (×2): 25 mg via INTRAVENOUS

## 2015-07-08 MED ORDER — MIDAZOLAM HCL 5 MG/5ML IJ SOLN
INTRAMUSCULAR | Status: DC
Start: 2015-07-08 — End: 2015-07-08
  Filled 2015-07-08: qty 10

## 2015-07-08 MED ORDER — PANTOPRAZOLE SODIUM 40 MG PO TBEC: 40.0000 mg | DELAYED_RELEASE_TABLET | Freq: Every day | ORAL | Status: DC

## 2015-07-08 MED ORDER — MIDAZOLAM HCL 5 MG/5ML IJ SOLN
INTRAMUSCULAR | Status: DC | PRN
  Administered 2015-07-08 (×4): 1 mg via INTRAVENOUS
  Administered 2015-07-08: 2 mg via INTRAVENOUS

## 2015-07-08 MED ORDER — MEPERIDINE HCL 50 MG/ML IJ SOLN
INTRAMUSCULAR | Status: DC
Start: 2015-07-08 — End: 2015-07-08
  Filled 2015-07-08: qty 1

## 2015-07-08 MED ORDER — BUTAMBEN-TETRACAINE-BENZOCAINE 2-2-14 % EX AERO
INHALATION_SPRAY | CUTANEOUS | Status: DC | PRN
  Administered 2015-07-08: 2 via TOPICAL

## 2015-07-08 NOTE — Op Note (Signed)
[facility] Patient Name: Lori Soto Procedure Date: 07/08/2015 9:17 AM MRN: [id number] Date of Birth: 1935-08-13 Attending MD: Hildred Laser , MD CSN: [id number] Age: 80 Admit Type: Outpatient Procedure:            Upper GI endoscopy Indications:          Dysphagia Providers:            Hildred Laser, MD, Gwenlyn Fudge, RN, Randa Spike,                        Technician Referring MD:         Chipper Herb (Referring MD) Medicines:            Midazolam 6 mg IV, Meperidine 50 mg IV, Cetacaine spray Complications:        No immediate complications. Estimated Blood Loss: Estimated blood loss was minimal. Procedure:            Pre-Anesthesia Assessment:                       - Prior to the procedure, a History and Physical was                        performed, and patient medications and allergies were                        reviewed. The patient's tolerance of previous                        anesthesia was also reviewed. The risks and benefits of                        the procedure and the sedation options and risks were                        discussed with the patient. All questions were                        answered, and informed consent was obtained. Prior                        Anticoagulants: The patient has taken no previous                        anticoagulant or antiplatelet agents. ASA Grade                        Assessment: I - A normal, healthy patient. After                        reviewing the risks and benefits, the patient was                        deemed in satisfactory condition to undergo the                        procedure.                       After obtaining informed consent, the endoscope was  passed under direct vision. Throughout the procedure,                        the patient's blood pressure, pulse, and oxygen                        saturations were monitored continuously. The EG-[id number]                        ([id number])  scope was introduced through the mouth, and                        advanced to the second part of duodenum. The upper GI                        endoscopy was accomplished without difficulty. The                        patient tolerated the procedure well. Scope In: 9:40:59 AM Scope Out: 9:53:58 AM Total Procedure Duration: 0 hours 12 minutes 59 seconds  Findings:      LA Grade A (one or more mucosal breaks less than 5 mm, not extending       between tops of 2 mucosal folds) esophagitis with no bleeding was found       in the lower third of the esophagus. Three biopsies were obtained at the       gastroesophageal junction with cold forceps for evaluation of chronic       inflammation.      A medium-sized hiatal hernia was found. The proximal extent of the       gastric folds (end of tubular esophagus) was 32 cm from the incisors.       The hiatal narrowing was 37 cm from the incisors. The Z-line was 32 cm       from the incisors.      One moderate (circumferential scarring or stenosis; an endoscope may       pass) benign-appearing, intrinsic stenosis was found 32 cm from the       incisors. This measured 9 mm (inner diameter) x 12 cm (in length) and       was traversed. A TTS dilator was passed through the scope. Dilation with       a 15-16.5-18 mm balloon dilator was performed to 18 mm.      The entire examined stomach was normal.      The examined duodenum was normal. Impression:           - LA Grade A esophagitis.                       - Medium-sized hiatal hernia.                       - Benign-appearing esophageal stenosis. Dilated.                       - Normal stomach.                       - Normal examined duodenum.                       - Three biopsies were obtained at  the gastroesophageal                        junction. Moderate Sedation:      Moderate (conscious) sedation was administered by the endoscopy nurse       and supervised by the endoscopist. The following  parameters were       monitored: oxygen saturation, heart rate, blood pressure, and response       to care. Total physician intraservice time was 13 minutes. Recommendation:       - Patient has a contact number available for                        emergencies. The signs and symptoms of potential                        delayed complications were discussed with the patient.                        Return to normal activities tomorrow. Written discharge                        instructions were provided to the patient.                       - Resume previous diet.                       - Use Protonix (pantoprazole) 40 mg PO daily for 4                        months.                       - Discharge patient to home.                       - Return to GI office in 3 months.                       - Await pathology results.                       - Medication reconciliation was performed, and a list                        of the patient's discharge medications was provided to                        the patient. Procedure Code(s):    --- Professional ---                       [phone], Esophagogastroduodenoscopy, flexible, transoral;                        with transendoscopic balloon dilation of esophagus                        (less than 30 mm diameter)                       43239, Esophagogastroduodenoscopy, flexible, transoral;  with biopsy, single or multiple                       99152, Moderate sedation services provided by the same                        physician or other qualified health care professional                        performing the diagnostic or therapeutic service that                        the sedation supports, requiring the presence of an                        independent trained observer to assist in the                        monitoring of the patient's level of consciousness and                        physiological status; initial 15 minutes of                         intraservice time, patient age 47 years or older Diagnosis Code(s):    --- Professional ---                       K20.9, Esophagitis, unspecified                       K44.9, Diaphragmatic hernia without obstruction or                        gangrene                       K22.2, Esophageal obstruction                       R13.10, Dysphagia, unspecified CPT copyright 2016 [employer]. All rights reserved. The codes documented in this report are preliminary and upon coder review may  be revised to meet current compliance requirements. Attending Participation: Hildred Laser, MD Hildred Laser, MD 07/08/2015 10:52:39 AM This report has been signed electronically. Number of Addenda: 0

## 2015-07-08 NOTE — H&P (Signed)
Lori Soto is an 80 y.o. female.   Chief Complaint: Patient is here for EGD, possible EGD and colonoscopy. HPI: Patient is 80 year old Caucasian female who was chronic ulcerative colitis and is here for surveillance colonoscopy. She is on oral mesalamine and remains in remission. Patient also complains of dysphagia primarily to pills. She points to upper sternum area site of bolus obstruction. She has no difficulty with foods. She knows she has hiatal hernia. She denies frequent heartburn nausea vomiting or melena.  Past Medical History  Diagnosis Date  . Ulcerative colitis   . Shingles   . Cataract   . H/O hiatal hernia   . Cancer ([place])     skin cancer on nose  . Anemia   . Scoliosis   . Osteopenia   . Carpal tunnel syndrome, bilateral   . Shingles     Past Surgical History  Procedure Laterality Date  . Colonoscopy    . Upper gastrointestinal endoscopy    . Bunionectomy      Bilateral  . Hernia      Right inguinal  . Abdominal hysterectomy    . Hernia repair  1961    right inguinal hernia  . Cataract extraction w/phaco  05/28/2012    Procedure: CATARACT EXTRACTION PHACO AND INTRAOCULAR LENS PLACEMENT (IOC);  Surgeon: Tonny Branch, MD;  Location: [facility];  Service: Ophthalmology;  Laterality: Right;  CDE=12.84  . Cataract extraction w/phaco  06/07/2012    Procedure: CATARACT EXTRACTION PHACO AND INTRAOCULAR LENS PLACEMENT (IOC);  Surgeon: Tonny Branch, MD;  Location: [facility];  Service: Ophthalmology;  Laterality: Left;  CDE 17.60  . Carpal tunnel release Left 03/10/2015    Procedure: LEFT CARPAL TUNNEL RELEASE;  Surgeon: Daryll Brod, MD;  Location: [facility];  Service: Orthopedics;  Laterality: Left;    Family History  Problem Relation Age of Onset  . Arthritis Mother   . Prostate cancer Father   . Liver cancer Brother   . Lung cancer Brother   . Heart disease Sister     Rheumatic Fever  . Diabetes Sister   . Colon cancer Sister   . GI problems Sister      diverticulitis  . Diabetes Sister   . Neuropathy Sister   . COPD Sister    Social History:  reports that she has never smoked. She has never used smokeless tobacco. She reports that she drinks alcohol. She reports that she does not use illicit drugs.  Allergies:  Allergies  Allergen Reactions  . Penicillins Itching and Swelling    Patient states that she had a rash also Has patient had a PCN reaction causing immediate rash, facial/tongue/throat swelling, SOB or lightheadedness with hypotension: No Has patient had a PCN reaction causing severe rash involving mucus membranes or skin necrosis: No Has patient had a PCN reaction that required hospitalization No Has patient had a PCN reaction occurring within the last 10 years: No If all of the above answers are "NO", then may proceed with Cephalosporin use.   . Influenza Virus Vacc Split Pf Rash    Per Patient she was told by her PCP not to ever take this injection again    Medications Prior to Admission  Medication Sig Dispense Refill  . Bromelains 500 MG TABS Take 500 mg by mouth daily.    . Calcium Carbonate-Vitamin D (CALCIUM 600+D) 600-400 MG-UNIT per tablet Take 1 tablet by mouth every evening.    [name] 500 MG CAPS Take 1 capsule  by mouth daily.     . Coenzyme Q10 (CO Q-10 PO) Take 1 tablet by mouth 2 (two) times daily. With red yeast rice [employer] Brand    . COLLAGEN PO Take 1 tablet by mouth daily. This is known as 1-2-3    . Cyanocobalamin (VITAMIN B 12 PO) Take 1,000 mcg by mouth daily.     [name] estradiol (ESTRACE) 1 MG tablet Take 1 mg by mouth every other day.     . Ginkgo Biloba 40 MG TABS Take 40 mg by mouth daily.     [name] glucosamine-chondroitin 500-400 MG tablet Take 1 tablet by mouth 2 (two) times daily.      [name] Hyaluronic Acid-Vitamin C (HYALURONIC ACID PO) Take 80 mg by mouth daily.    . Mesalamine (ASACOL HD) 800 MG TBEC Take 1-2 tablets by mouth 2 (two) times daily. 2 tablets in the morning and 1 tablet in the  evening.    [name] MILK THISTLE PO Take 100 mg by mouth daily.    . Misc Natural Products (BUTCHERS BROOM PO) Take 1 tablet by mouth daily.     . MULTIPLE VITAMINS PO Take 1 tablet by mouth daily. In the mornings    . Nutritional Supplements (GRAPESEED EXTRACT PO) Take 60 mg by mouth daily.     [name] OAT BRAN SOLUBLE PO Take 1 tablet by mouth daily.    [name] OVER THE COUNTER MEDICATION Take 66 mg by mouth daily. Vision Viviann Spare    . OVER THE COUNTER MEDICATION Take 250 mg by mouth daily. Alphalipoic Acid 250 mg daily    . OVER THE COUNTER MEDICATION Take 20 mg by mouth daily. [place]    . polyethylene glycol-electrolytes (NULYTELY/GOLYTELY) 420 g solution Take 4,000 mLs by mouth once. 4000 mL 0  . POTASSIUM GLUCONATE PO Take 99 mg by mouth daily. This is Chelated Potassium -[employer]    . pyridOXINE (VITAMIN B-6) 100 MG tablet Take 100 mg by mouth daily.     . Turmeric 500 MG CAPS Take 1 capsule by mouth 3 (three) times daily.     . Vitamins C E (CRANBERRY CONCENTRATE PO) Take 2,500 mg by mouth daily.     . Zinc 50 MG CAPS Take 50 mg by mouth daily.     . ASACOL HD 800 MG TBEC TAKE 2 TABLETS TWICE A DAY (Patient not taking: Reported on 06/24/2015) 360 tablet 2    No results found for this or any previous visit (from the past 48 hour(s)). No results found.  ROS  Blood pressure 133/66, pulse 56, temperature 98 F (36.7 C), temperature source Oral, resp. rate 18, height 5' 3"  (1.6 m), weight 135 lb (61.236 kg), SpO2 100 %. Physical Exam  Constitutional: She is oriented to person, place, and time. She appears well-developed and well-nourished.  HENT:  Mouth/Throat: Oropharynx is clear and moist.  Eyes: Conjunctivae are normal. No scleral icterus.  Neck: No thyromegaly present.  Cardiovascular: Normal rate, regular rhythm and normal heart sounds.   No murmur heard. Respiratory: Effort normal and breath sounds normal.  GI: Soft. She exhibits no distension and no mass. There is no tenderness.   Musculoskeletal: She exhibits no edema.  Neurological: She is alert and oriented to person, place, and time.  Skin: Skin is warm and dry.     Assessment/Plan Dysphagia to pills. History of ulcerative colitis. EGD possible EGD and surveillance colonoscopy.  Rogene Houston, MD 07/08/2015, 9:31 AM

## 2015-07-08 NOTE — Discharge Instructions (Signed)
No aspirin or NSAIDs for 1 week. Resume usual medications and diet. Pantoprazole 40 mg by mouth 30 minutes before breakfast daily. No driving for 24 hours. Physician will call with biopsy results.  Gastroesophageal Reflux Disease, Adult Normally, food travels down the esophagus and stays in the stomach to be digested. However, when a person has gastroesophageal reflux disease (GERD), food and stomach acid move back up into the esophagus. When this happens, the esophagus becomes sore and inflamed. Over time, GERD can create small holes (ulcers) in the lining of the esophagus.  CAUSES This condition is caused by a problem with the muscle between the esophagus and the stomach (lower esophageal sphincter, or LES). Normally, the LES muscle closes after food passes through the esophagus to the stomach. When the LES is weakened or abnormal, it does not close properly, and that allows food and stomach acid to go back up into the esophagus. The LES can be weakened by certain dietary substances, medicines, and medical conditions, including:  Tobacco use.  Pregnancy.  Having a hiatal hernia.  Heavy alcohol use.  Certain foods and beverages, such as coffee, chocolate, onions, and peppermint. RISK FACTORS This condition is more likely to develop in:  People who have an increased body weight.  People who have connective tissue disorders.  People who use NSAID medicines. SYMPTOMS Symptoms of this condition include:  Heartburn.  Difficult or painful swallowing.  The feeling of having a lump in the throat.  Abitter taste in the mouth.  Bad breath.  Having a large amount of saliva.  Having an upset or bloated stomach.  Belching.  Chest pain.  Shortness of breath or wheezing.  Ongoing (chronic) cough or a night-time cough.  Wearing away of tooth enamel.  Weight loss. Different conditions can cause chest pain. Make sure to see your health care provider if you experience chest  pain. DIAGNOSIS Your health care provider will take a medical history and perform a physical exam. To determine if you have mild or severe GERD, your health care provider may also monitor how you respond to treatment. You may also have other tests, including:  An endoscopy toexamine your stomach and esophagus with a small camera.  A test thatmeasures the acidity level in your esophagus.  A test thatmeasures how much pressure is on your esophagus.  A barium swallow or modified barium swallow to show the shape, size, and functioning of your esophagus. TREATMENT The goal of treatment is to help relieve your symptoms and to prevent complications. Treatment for this condition may vary depending on how severe your symptoms are. Your health care provider may recommend:  Changes to your diet.  Medicine.  Surgery. HOME CARE INSTRUCTIONS Diet  Follow a diet as recommended by your health care provider. This may involve avoiding foods and drinks such as:  Coffee and tea (with or without caffeine).  Drinks that containalcohol.  Energy drinks and sports drinks.  Carbonated drinks or sodas.  Chocolate and cocoa.  Peppermint and mint flavorings.  Garlic and onions.  Horseradish.  Spicy and acidic foods, including peppers, chili powder, curry powder, vinegar, hot sauces, and barbecue sauce.  Citrus fruit juices and citrus fruits, such as oranges, lemons, and limes.  Tomato-based foods, such as red sauce, chili, salsa, and pizza with red sauce.  Fried and fatty foods, such as donuts, french fries, potato chips, and high-fat dressings.  High-fat meats, such as hot dogs and fatty cuts of red and white meats, such as rib eye steak,  sausage, ham, and bacon.  High-fat dairy items, such as whole milk, butter, and cream cheese.  Eat small, frequent meals instead of large meals.  Avoid drinking large amounts of liquid with your meals.  Avoid eating meals during the 2-3 hours before  bedtime.  Avoid lying down right after you eat.  Do not exercise right after you eat. General Instructions  Pay attention to any changes in your symptoms.  Take over-the-counter and prescription medicines only as told by your health care provider. Do not take aspirin, ibuprofen, or other NSAIDs unless your health care provider told you to do so.  Do not use any tobacco products, including cigarettes, chewing tobacco, and e-cigarettes. If you need help quitting, ask your health care provider.  Wear loose-fitting clothing. Do not wear anything tight around your waist that causes pressure on your abdomen.  Raise (elevate) the head of your bed 6 inches (15cm).  Try to reduce your stress, such as with yoga or meditation. If you need help reducing stress, ask your health care provider.  If you are overweight, reduce your weight to an amount that is healthy for you. Ask your health care provider for guidance about a safe weight loss goal.  Keep all follow-up visits as told by your health care provider. This is important. SEEK MEDICAL CARE IF:  You have new symptoms.  You have unexplained weight loss.  You have difficulty swallowing, or it hurts to swallow.  You have wheezing or a persistent cough.  Your symptoms do not improve with treatment.  You have a hoarse voice. SEEK IMMEDIATE MEDICAL CARE IF:  You have pain in your arms, neck, jaw, teeth, or back.  You feel sweaty, dizzy, or light-headed.  You have chest pain or shortness of breath.  You vomit and your vomit looks like blood or coffee grounds.  You faint.  Your stool is bloody or black.  You cannot swallow, drink, or eat.   This information is not intended to replace advice given to you by your health care provider. Make sure you discuss any questions you have with your health care provider.   Document Released: 01/26/2005 Document Revised: 01/07/2015 Document Reviewed: 08/13/2014 Elsevier Interactive Patient  Education 2016 [employer]  Colon Polyps Polyps are lumps of extra tissue growing inside the body. Polyps can grow in the large intestine (colon). Most colon polyps are noncancerous (benign). However, some colon polyps can become cancerous over time. Polyps that are larger than a pea may be harmful. To be safe, caregivers remove and test all polyps. CAUSES  Polyps form when mutations in the genes cause your cells to grow and divide even though no more tissue is needed. RISK FACTORS There are a number of risk factors that can increase your chances of getting colon polyps. They include:  Being older than 50 years.  Family history of colon polyps or colon cancer.  Long-term colon diseases, such as colitis or Crohn disease.  Being overweight.  Smoking.  Being inactive.  Drinking too much alcohol. SYMPTOMS  Most small polyps do not cause symptoms. If symptoms are present, they may include:  Blood in the stool. The stool may look dark red or black.  Constipation or diarrhea that lasts longer than 1 week. DIAGNOSIS People often do not know they have polyps until their caregiver finds them during a regular checkup. Your caregiver can use 4 tests to check for polyps:  Digital rectal exam. The caregiver wears gloves and feels inside the rectum. This  test would find polyps only in the rectum.  Barium enema. The caregiver puts a liquid called barium into your rectum before taking X-rays of your colon. Barium makes your colon look white. Polyps are dark, so they are easy to see in the X-ray pictures.  Sigmoidoscopy. A thin, flexible tube (sigmoidoscope) is placed into your rectum. The sigmoidoscope has a light and tiny camera in it. The caregiver uses the sigmoidoscope to look at the last third of your colon.  Colonoscopy. This test is like sigmoidoscopy, but the caregiver looks at the entire colon. This is the most common method for finding and removing polyps. TREATMENT  Any polyps  will be removed during a sigmoidoscopy or colonoscopy. The polyps are then tested for cancer. PREVENTION  To help lower your risk of getting more colon polyps:  Eat plenty of fruits and vegetables. Avoid eating fatty foods.  Do not smoke.  Avoid drinking alcohol.  Exercise every day.  Lose weight if recommended by your caregiver.  Eat plenty of calcium and folate. Foods that are rich in calcium include milk, cheese, and broccoli. Foods that are rich in folate include chickpeas, kidney beans, and spinach. HOME CARE INSTRUCTIONS Keep all follow-up appointments as directed by your caregiver. You may need periodic exams to check for polyps. SEEK MEDICAL CARE IF: You notice bleeding during a bowel movement.   This information is not intended to replace advice given to you by your health care provider. Make sure you discuss any questions you have with your health care provider.   Document Released: 01/13/2004 Document Revised: 05/09/2014 Document Reviewed: 06/28/2011 Elsevier Interactive Patient Education 2016 Elsevier Inc.  Hiatal Hernia A hiatal hernia occurs when part of your stomach slides above the muscle that separates your abdomen from your chest (diaphragm). You can be born with a hiatal hernia (congenital), or it may develop over time. In almost all cases of hiatal hernia, only the top part of the stomach pushes through.  Many people have a hiatal hernia with no symptoms. The larger the hernia, the more likely that you will have symptoms. In some cases, a hiatal hernia allows stomach acid to flow back into the tube that carries food from your mouth to your stomach (esophagus). This may cause heartburn symptoms. Severe heartburn symptoms may mean you have developed a condition called gastroesophageal reflux disease (GERD).  CAUSES  Hiatal hernias are caused by a weakness in the opening (hiatus) where your esophagus passes through your diaphragm to attach to the upper part of your  stomach. You may be born with a weakness in your hiatus, or a weakness can develop. RISK FACTORS Older age is a major risk factor for a hiatal hernia. Anything that increases pressure on your diaphragm can also increase your risk of a hiatal hernia. This includes:  Pregnancy.  Excess weight.  Frequent constipation. SIGNS AND SYMPTOMS  People with a hiatal hernia often have no symptoms. If symptoms develop, they are almost always caused by GERD. They may include:  Heartburn.  Belching.  Indigestion.  Trouble swallowing.  Coughing or wheezing.  Sore throat.  Hoarseness.  Chest pain. DIAGNOSIS  A hiatal hernia is sometimes found during an exam for another problem. Your health care provider may suspect a hiatal hernia if you have symptoms of GERD. Tests may be done to diagnose GERD. These may include:  X-rays of your stomach or chest.  An upper gastrointestinal (GI) series. This is an X-ray exam of your GI tract involving the use  of a chalky liquid that you swallow. The liquid shows up clearly on the X-ray.  Endoscopy. This is a procedure to look into your stomach using a thin, flexible tube that has a tiny camera and light on the end of it. TREATMENT  If you have no symptoms, you may not need treatment. If you have symptoms, treatment may include:  Dietary and lifestyle changes to help reduce GERD symptoms.  Medicines. These may include:  Over-the-counter antacids.  Medicines that make your stomach empty more quickly.  Medicines that block the production of stomach acid (H2 blockers).  Stronger medicines to reduce stomach acid (proton pump inhibitors).  You may need surgery to repair the hernia if other treatments are not helping. HOME CARE INSTRUCTIONS   Take all medicines as directed by your health care provider.  Quit smoking, if you smoke.  Try to achieve and maintain a healthy body weight.  Eat frequent small meals instead of three large meals a day. This  keeps your stomach from getting too full.  Eat slowly.  Do not lie down right after eating.  Do noteat 1-2 hours before bed.   Do not drink beverages with caffeine. These include cola, coffee, cocoa, and tea.  Do not drink alcohol.  Avoid foods that can make symptoms of GERD worse. These may include:  Fatty foods.  Citrus fruits.  Other foods and drinks that contain acid.  Avoid putting pressure on your belly. Anything that puts pressure on your belly increases the amount of acid that may be pushed up into your esophagus.   Avoid bending over, especially after eating.  Raise the head of your bed by putting blocks under the legs. This keeps your head and esophagus higher than your stomach.  Do not wear tight clothing around your chest or stomach.  Try not to strain when having a bowel movement, when urinating, or when lifting heavy objects. SEEK MEDICAL CARE IF:  Your symptoms are not controlled with medicines or lifestyle changes.  You are having trouble swallowing.  You have coughing or wheezing that will not go away. SEEK IMMEDIATE MEDICAL CARE IF:  Your pain is getting worse.  Your pain spreads to your arms, neck, jaw, teeth, or back.  You have shortness of breath.  You sweat for no reason.  You feel sick to your stomach (nauseous) or vomit.  You vomit blood.  You have bright red blood in your stools.  You have black, tarry stools.    This information is not intended to replace advice given to you by your health care provider. Make sure you discuss any questions you have with your health care provider.   Document Released: 07/09/2003 Document Revised: 05/09/2014 Document Reviewed: 04/05/2013 Elsevier Interactive Patient Education [employer].

## 2015-07-08 NOTE — Op Note (Signed)
[facility] Patient Name: Lori Soto Procedure Date: 07/08/2015 9:54 AM MRN: [id number] Date of Birth: May 10, 1935 Attending MD: Hildred Laser , MD CSN: [id number] Age: 80 Admit Type: Outpatient Procedure:            Colonoscopy Indications:          High risk colon cancer surveillance: Ulcerative                        pancolitis of 8 (or more) years duration Providers:            Hildred Laser, MD, Gwenlyn Fudge, RN, Randa Spike,                        Technician Referring MD:          Medicines:            Midazolam 4 mg IV Complications:        No immediate complications. Estimated Blood Loss: Estimated blood loss: none. Procedure:            Pre-Anesthesia Assessment:                       - Prior to the procedure, a History and Physical was                        performed, and patient medications and allergies were                        reviewed. The patient's tolerance of previous                        anesthesia was also reviewed. The risks and benefits of                        the procedure and the sedation options and risks were                        discussed with the patient. All questions were                        answered, and informed consent was obtained. Prior                        Anticoagulants: The patient has taken no previous                        anticoagulant or antiplatelet agents. ASA Grade                        Assessment: I - A normal, healthy patient. After                        reviewing the risks and benefits, the patient was                        deemed in satisfactory condition to undergo the                        procedure.  After obtaining informed consent, the colonoscope was                        passed under direct vision. Throughout the procedure,                        the patient's blood pressure, pulse, and oxygen                        saturations were monitored continuously. The [id number]                         ([id number]) scope was introduced through the anus and                        advanced to the the cecum, identified by appendiceal                        orifice and ileocecal valve. The colonoscopy was                        technically difficult and complex due to significant                        looping. Successful completion of the procedure was                        aided by changing the patient to a supine position and                        applying abdominal pressure. The patient tolerated the                        procedure well. The quality of the bowel preparation                        was excellent. The ileocecal valve, appendiceal                        orifice, and rectum were photographed. Scope In: 9:59:20 AM Scope Out: 10:33:19 AM Scope Withdrawal Time: 0 hours 8 minutes 43 seconds  Total Procedure Duration: 0 hours 33 minutes 59 seconds  Findings:      A 5 mm polyp was found in the cecum. The polyp was sessile. The polyp       was removed with a lift and cut technique using a cold snare. Resection       and retrieval were complete.      A 7 mm polyp with ulcerated surface was found in the sigmoid colon. The       polyp was semi-pedunculated. The polyp was removed with a hot snare.       Resection and retrieval were complete. To prevent bleeding after the       polypectomy, one hemostatic clip was successfully placed (MR       conditional). There was no bleeding at the end of the procedure.      Multiple medium-mouthed diverticula were found in the sigmoid colon. Impression:           - One 5 mm polyp in the cecum, removed using lift and  cut and a cold snare. Resected and retrieved.                       - One 7 mm polyp in the sigmoid colon, removed with a                        hot snare. Resected and retrieved. Clip (MR                        conditional) was placed.                       - Diverticulosis in the sigmoid  colon. Moderate Sedation:      Moderate (conscious) sedation was administered by the endoscopy nurse       and supervised by the endoscopist. The following parameters were       monitored: oxygen saturation, heart rate, blood pressure, and response       to care. Total physician intraservice time was 17 minutes. Recommendation:       - Patient has a contact number available for                        emergencies. The signs and symptoms of potential                        delayed complications were discussed with the patient.                        Return to normal activities tomorrow. Written discharge                        instructions were provided to the patient.                       - Resume previous diet.                       - Medication reconciliation was performed, and a list                        of the patient's discharge medications was provided to                        the patient.                       - Repeat colonoscopy in 5 years for surveillance.                       - Await pathology results.                       - Return to GI office in 6 months. Procedure Code(s):    --- Professional ---                       [phone], Colonoscopy, flexible; with removal of tumor(s),                        polyp(s), or other lesion(s) by snare technique  46270, Moderate sedation services provided by the same                        physician or other qualified health care professional                        performing the diagnostic or therapeutic service that                        the sedation supports, requiring the presence of an                        independent trained observer to assist in the                        monitoring of the patient's level of consciousness and                        physiological status; initial 15 minutes of                        intraservice time, patient age 76 years or older Diagnosis Code(s):    --- Professional ---                        K51.00, Ulcerative (chronic) pancolitis without                        complications                       D12.0, Benign neoplasm of cecum                       D12.5, Benign neoplasm of sigmoid colon                       K57.30, Diverticulosis of large intestine without                        perforation or abscess without bleeding CPT copyright 2016 American Medical Association. All rights reserved. The codes documented in this report are preliminary and upon coder review may  be revised to meet current compliance requirements. Attending Participation: Hildred Laser, MD Hildred Laser, MD 07/08/2015 11:03:24 AM This report has been signed electronically. Number of Addenda: 0

## 2015-07-13 ENCOUNTER — Encounter (HOSPITAL_COMMUNITY): Payer: Self-pay | Admitting: Internal Medicine

## 2015-07-31 ENCOUNTER — Other Ambulatory Visit (INDEPENDENT_AMBULATORY_CARE_PROVIDER_SITE_OTHER): Payer: Self-pay | Admitting: *Deleted

## 2015-07-31 MED ORDER — PANTOPRAZOLE SODIUM 40 MG PO TBEC: 40.0000 mg | DELAYED_RELEASE_TABLET | Freq: Every day | ORAL | Status: DC

## 2015-07-31 NOTE — Telephone Encounter (Signed)
Patient states that she needs this Rx sent to [facility]. She was only allowed to get a 1 time fill at a local pharmacy. This has been sent to [facility].

## 2015-08-11 IMAGING — CR DG KNEE 1-2V*L*
2 series · 2 of 2 positions shown · non-contrast
Comparison: None.

CLINICAL DATA: Fell pop with swelling, left knee pain

EXAM:
LEFT KNEE - 1-2 VIEW

[view not recorded (1 of 2)]
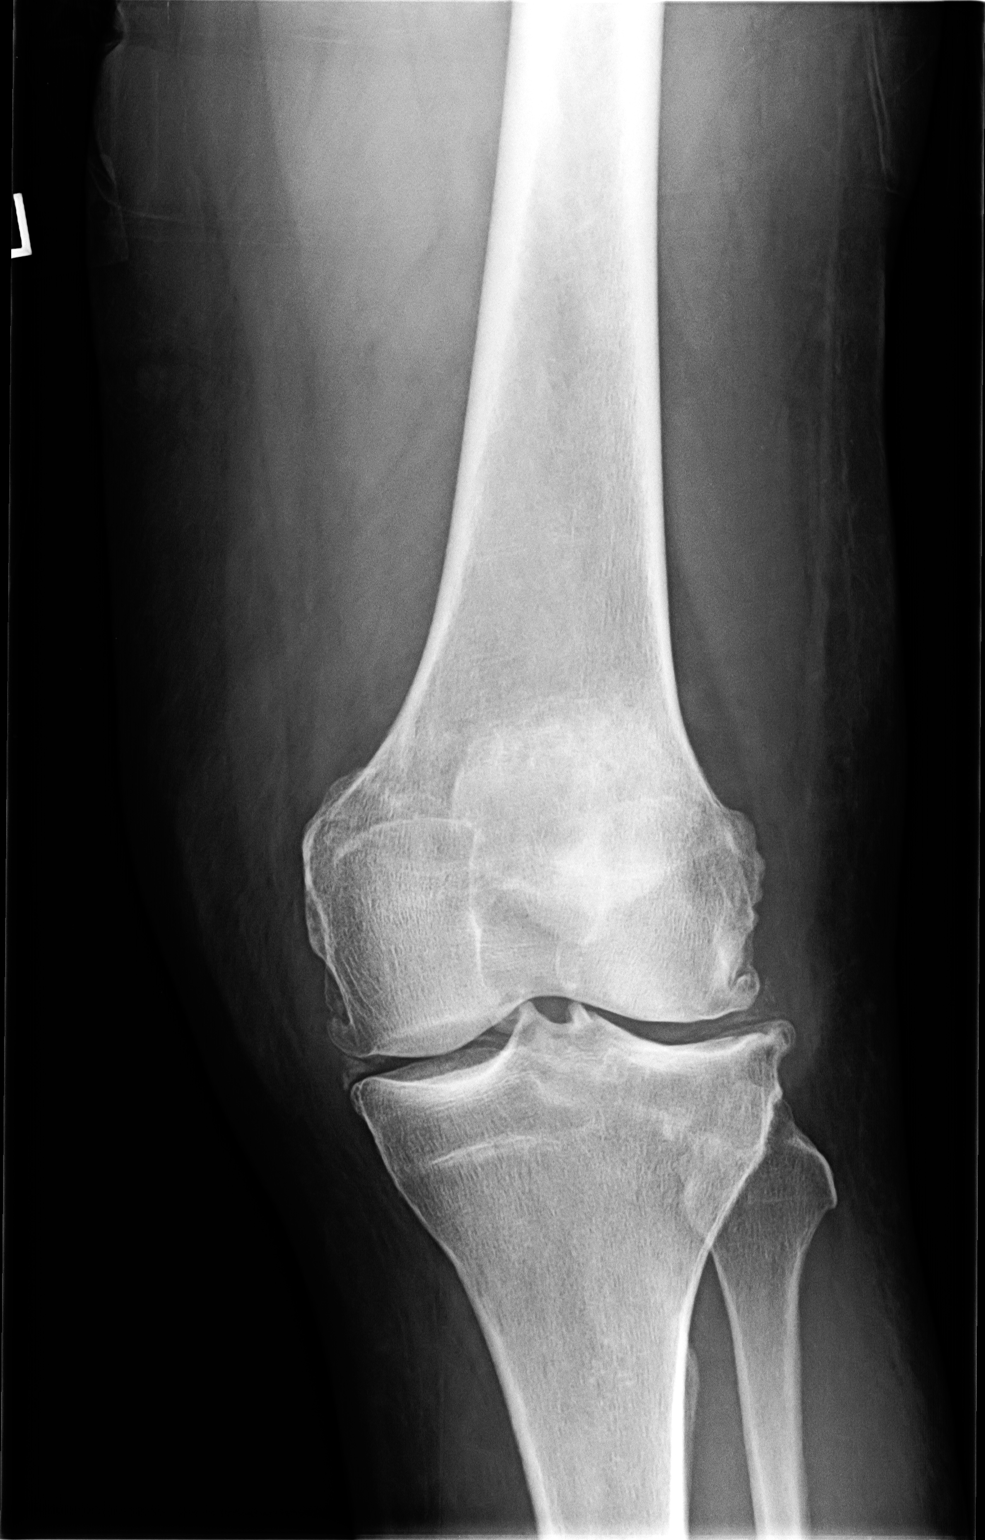

[view not recorded (2 of 2)]
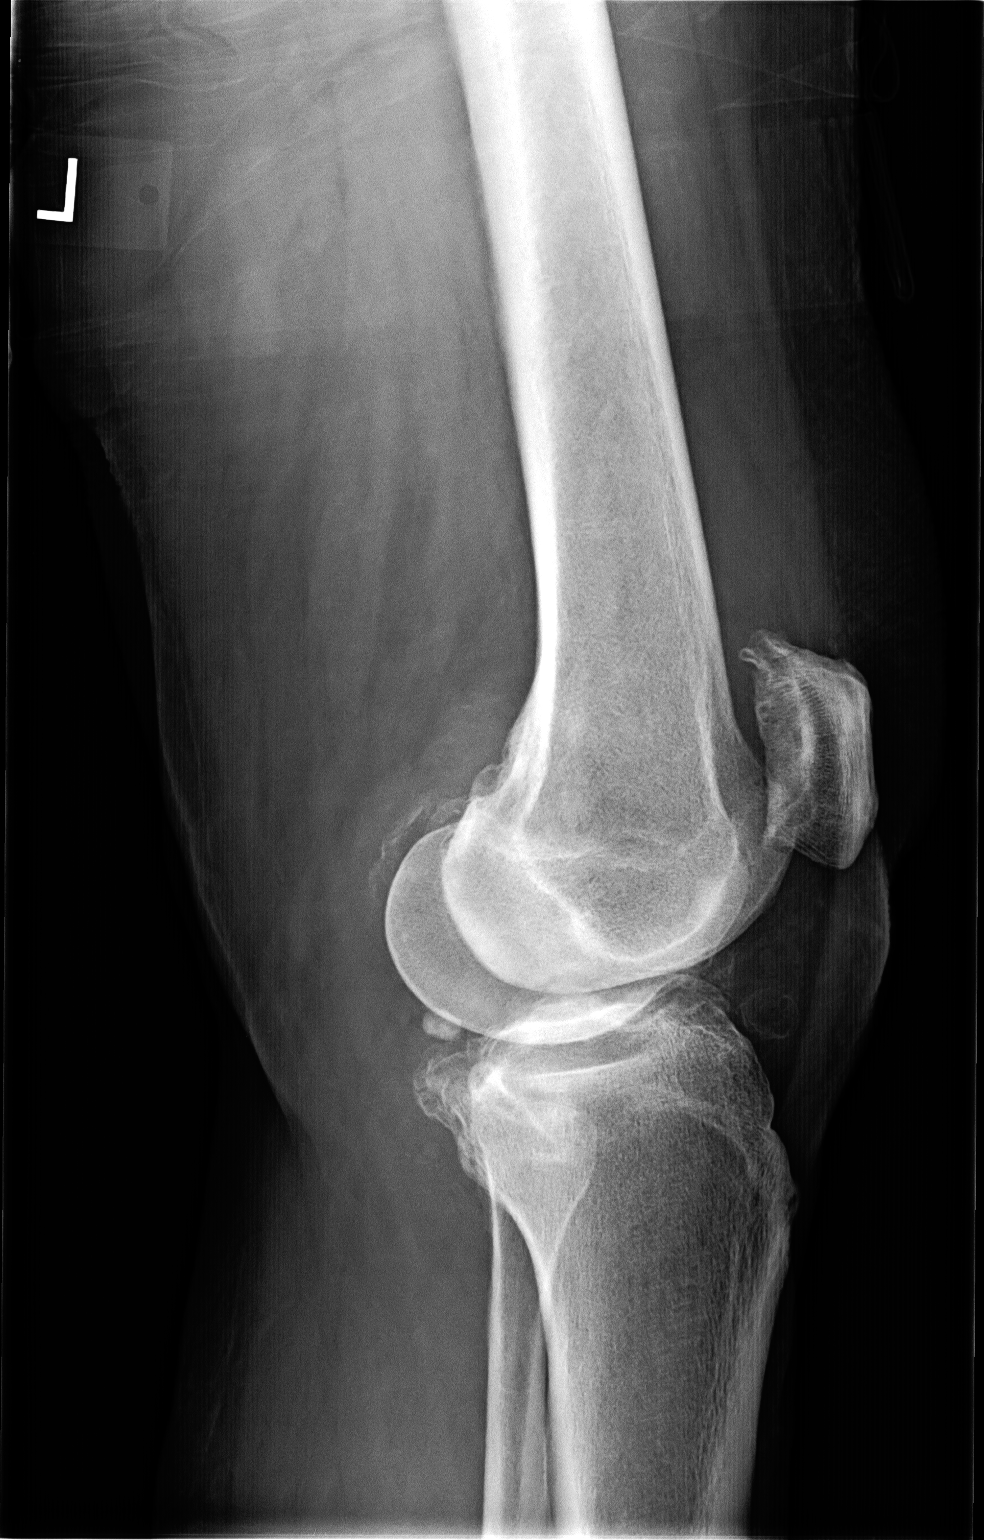

[2 of 2 positions shown; findings below may reference images not displayed]

FINDINGS: There is tricompartmental degenerative joint disease consistent with
the patient's age. There is chondrocalcinosis present which may
indicate CPPD arthropathy. No definite joint effusion is seen. No
acute fracture is noted.
IMPRESSION: Tricompartmental degenerative joint disease. Chondrocalcinosis.
Question CPPD.

## 2015-08-17 DIAGNOSIS — G5601 Carpal tunnel syndrome, right upper limb: Secondary | ICD-10-CM | POA: Diagnosis not present

## 2015-08-17 DIAGNOSIS — M1812 Unilateral primary osteoarthritis of first carpometacarpal joint, left hand: Secondary | ICD-10-CM | POA: Diagnosis not present

## 2015-08-17 DIAGNOSIS — G5602 Carpal tunnel syndrome, left upper limb: Secondary | ICD-10-CM | POA: Diagnosis not present

## 2015-08-17 DIAGNOSIS — M1811 Unilateral primary osteoarthritis of first carpometacarpal joint, right hand: Secondary | ICD-10-CM | POA: Diagnosis not present

## 2015-10-09 NOTE — Telephone Encounter (Signed)
error 

## 2015-11-16 ENCOUNTER — Ambulatory Visit (INDEPENDENT_AMBULATORY_CARE_PROVIDER_SITE_OTHER): Payer: Medicare Other | Admitting: Pharmacist

## 2015-11-16 ENCOUNTER — Encounter: Payer: Self-pay | Admitting: Pharmacist

## 2015-11-16 VITALS — BP 110/64 | HR 68 | Ht 63.0 in | Wt 136.5 lb

## 2015-11-16 DIAGNOSIS — Z0000 Encounter for general adult medical examination without abnormal findings: Secondary | ICD-10-CM | POA: Diagnosis not present

## 2015-11-16 DIAGNOSIS — M8580 Other specified disorders of bone density and structure, unspecified site: Secondary | ICD-10-CM

## 2015-11-16 DIAGNOSIS — M419 Scoliosis, unspecified: Secondary | ICD-10-CM

## 2015-11-16 MED ORDER — CRISABOROLE 2 % EX OINT: 1.0000 [drp] | TOPICAL_OINTMENT | Freq: Every day | CUTANEOUS | Status: DC

## 2015-11-16 NOTE — Progress Notes (Signed)
Patient ID: Lori Soto, female   DOB: 27-May-1935, 80 y.o.   MRN: [id number]    Subjective:   Lori Soto is a 80 y.o. female who presents for a subsequent Medicare Annual Wellness Visit.  Lori Soto is widowed and lives alone in [place], [place].    Review of Systems  Constitutional: Negative.   Eyes: Positive for redness (has red eye lids upon rising each morning).  Respiratory: Negative.   Cardiovascular: Negative.   Gastrointestinal: Negative.  Negative for diarrhea, constipation (no recent change in BM ) and blood in stool.       Bloating   Genitourinary: Negative.   Musculoskeletal: Positive for joint pain.  Skin: Positive for itching (eye lids).  Neurological: Negative.   Endo/Heme/Allergies: Negative.   Psychiatric/Behavioral: Negative.      Current Medications (verified) Outpatient Encounter Prescriptions as of 11/16/2015  Medication Sig  . Bromelains 500 MG TABS Take 500 mg by mouth daily.  . Calcium Carbonate-Vitamin D (CALCIUM 600+D) 600-400 MG-UNIT per tablet Take 1 tablet by mouth every evening.  [name] 500 MG CAPS Take 1 capsule by mouth daily.   . Coenzyme Q10 (CO Q-10 PO) Take 1 tablet by mouth 2 (two) times daily. With red yeast rice Pur[employer] Pride Brand  . COLLAGEN PO Take 1 tablet by mouth daily. This is known as 1-2-3  . Cyanocobalamin (VITAMIN B 12 PO) Take 1,000 mcg by mouth daily.   [name] estradiol (ESTRACE) 1 MG tablet Take 1 mg by mouth every other day.   . Ginkgo Biloba 40 MG TABS Take 40 mg by mouth daily.   [name] glucosamine-chondroitin 500-400 MG tablet Take 1 tablet by mouth 2 (two) times daily.    [name] Hyaluronic Acid-Vitamin C (HYALURONIC ACID PO) Take 80 mg by mouth daily.  . Mesalamine (ASACOL HD) 800 MG TBEC Take 1-2 tablets by mouth 2 (two) times daily. 2 tablets in the morning and 1 tablet in the evening.  [name] MILK THISTLE PO Take 100 mg by mouth daily.  . Misc Natural Products (BUTCHERS BROOM PO) Take 1 tablet by mouth daily.   . MULTIPLE  VITAMINS PO Take 1 tablet by mouth daily. In the mornings  . Nutritional Supplements (GRAPESEED EXTRACT PO) Take 60 mg by mouth daily.   [name] OAT BRAN SOLUBLE PO Take 1 tablet by mouth daily.  [name] OVER THE COUNTER MEDICATION Take 66 mg by mouth daily. Vision Gold Lutein  . OVER THE COUNTER MEDICATION Take 250 mg by mouth daily. Alphalipoic Acid 250 mg daily  . POTASSIUM GLUCONATE PO Take 99 mg by mouth daily. This is Chelated Potassium -Puritans Pride  . pyridOXINE (VITAMIN B-6) 100 MG tablet Take 100 mg by mouth daily.   . Turmeric 500 MG CAPS Take 1 capsule by mouth 3 (three) times daily.   . Vitamins C E (CRANBERRY CONCENTRATE PO) Take 2,500 mg by mouth daily.   . Zinc 50 MG CAPS Take 50 mg by mouth daily.   . pantoprazole (PROTONIX) 40 MG tablet Take 1 tablet (40 mg total) by mouth daily before breakfast.  . [DISCONTINUED] OVER THE COUNTER MEDICATION Take 20 mg by mouth daily. Reported on 11/16/2015   No facility-administered encounter medications on file as of 11/16/2015.    Allergies (verified) Penicillins and Influenza virus vacc split pf   History: Past Medical History  Diagnosis Date  . Ulcerative colitis   . Shingles   . Cataract   . H/O hiatal hernia   . Cancer ([place])  skin cancer on nose  . Anemia   . Scoliosis   . Osteopenia   . Carpal tunnel syndrome, bilateral   . Shingles   . GERD (gastroesophageal reflux disease)   . Esophagitis    Past Surgical History  Procedure Laterality Date  . Colonoscopy    . Upper gastrointestinal endoscopy    . Bunionectomy      Bilateral  . Hernia      Right inguinal  . Abdominal hysterectomy    . Hernia repair  1961    right inguinal hernia  . Cataract extraction w/phaco  05/28/2012    Procedure: CATARACT EXTRACTION PHACO AND INTRAOCULAR LENS PLACEMENT (IOC);  Surgeon: Tonny Branch, MD;  Location: [facility];  Service: Ophthalmology;  Laterality: Right;  CDE=12.84  . Cataract extraction w/phaco  06/07/2012    Procedure: CATARACT  EXTRACTION PHACO AND INTRAOCULAR LENS PLACEMENT (IOC);  Surgeon: Tonny Branch, MD;  Location: [facility];  Service: Ophthalmology;  Laterality: Left;  CDE 17.60  . Carpal tunnel release Left 03/10/2015    Procedure: LEFT CARPAL TUNNEL RELEASE;  Surgeon: Daryll Brod, MD;  Location: [facility];  Service: Orthopedics;  Laterality: Left;  . Colonoscopy N/A 07/08/2015    Procedure: COLONOSCOPY;  Surgeon: Rogene Houston, MD;  Location: [facility];  Service: Endoscopy;  Laterality: N/A;  930  . Esophagogastroduodenoscopy N/A 07/08/2015    Procedure: ESOPHAGOGASTRODUODENOSCOPY (EGD);  Surgeon: Rogene Houston, MD;  [facility];  Service: Endoscopy;  Laterality: N/A;  . Esophageal dilation N/A 07/08/2015    Procedure: ESOPHAGEAL DILATION;  Surgeon: Rogene Houston, MD;  [facility]: [facility];  Service: Endoscopy;  Laterality: N/A;   Family History  Problem Relation Age of Onset  . Arthritis Mother   . Cancer Father     pancreat  . Pancreatic cancer Father   . Liver cancer Brother   . Lung cancer Brother   . Heart disease Sister     Rheumatic Fever  . Diabetes Sister   . Colon cancer Sister   . GI problems Sister     diverticulitis  . Diabetes Sister   . Neuropathy Sister   . COPD Sister   . Cancer Sister     lung and liver   Social History   Occupational History  .     Social History Main Topics  . Smoking status: Never Smoker   . Smokeless tobacco: Never Used     Comment: Never smoker  . Alcohol Use: 0.0 oz/week    0 Standard drinks or equivalent per week     Comment: Very Rarely  . Drug Use: No  . Sexual Activity: Not Currently    Birth Control/ Protection: Surgical    Do you feel safe at home?  Yes  Dietary issues and exercise activities: Current Exercise Habits: The patient does not participate in regular exercise at present   Current Dietary habits:  Excellent diet.  Eats lots of fruits and vegetables.   Limits red meat and tried to choose lean  proteins.  Objective:    Today's Vitals   11/16/15 1533  BP: 110/64  Pulse: 68  Height: 5' 3"  (1.6 m)  Weight: 136 lb 8 oz (61.916 kg)  PainSc: 2   PainLoc: Knee   Body mass index is 24.19 kg/(m^2).  Activities of Daily Living In your present state of health, do you have any difficulty performing the following activities: 11/16/2015 03/10/2015  Hearing? N N  Vision? N N  Difficulty concentrating or making decisions? N N  Walking or climbing stairs? N Y  Dressing or bathing? N N  Doing errands, shopping? N -  Preparing Food and eating ? N -  Using the Toilet? N -  In the past six months, have you accidently leaked urine? N -  Do you have problems with loss of bowel control? N -  Managing your Medications? N -  Managing your Finances? N -  Housekeeping or managing your Housekeeping? N -    Are there smokers in your home (other than you)? No   Cardiac Risk Factors include: advanced age (>69mn, >>94women)  Depression Screen PHQ 2/9 Scores 11/16/2015 06/11/2015 01/23/2015 12/05/2014  PHQ - 2 Score 2 1 1 1   PHQ- 9 Score 3 - - -  Patient continues to attend Grief Sharing group on a regular basis.    Fall Risk Fall Risk  11/16/2015 06/11/2015 12/05/2014 05/13/2014 11/15/2013  Falls in the past year? No No No No No    Cognitive Function: MMSE - Mini Mental State Exam 11/16/2015 01/23/2015  Orientation to time 5 5  Orientation to Place 5 5  Registration 3 3  Attention/ Calculation 5 4  Recall 3 3  Language- name 2 objects 2 2  Language- repeat 1 1  Language- follow 3 step command 3 2  Language- read & follow direction 1 1  Write a sentence 1 1  Copy design 1 1  Total score 30 28    Immunizations and Health Maintenance Immunization History  Administered Date(s) Administered  . Pneumococcal Conjugate-13 02/26/2014  . Tdap 01/23/2015   Health Maintenance Due  Topic Date Due  . MAMMOGRAM  11/14/2015    Patient Care Team: DChipper Herb MD as PCP - General (Family  Medicine) NRogene Houston MD as Consulting Physician (Gastroenterology) FGaynelle Arabian MD as Consulting Physician (Orthopedic Surgery)  Indicate any recent Medical Services you may have received from other than [facility] providers in the past year (date may be approximate).    Assessment:    Annual Wellness Visit   Screening Tests Health Maintenance  Topic Date Due  . MAMMOGRAM  11/14/2015  . INFLUENZA VACCINE  01/01/2016 (Originally 12/01/2015)  . PNA vac Low Risk Adult (2 of 2 - PPSV23) 01/09/2016 (Originally 02/27/2015)  . ZOSTAVAX  05/13/2016 (Originally 12/13/1997)  . DEXA SCAN  02/20/2016  . TETANUS/TDAP  01/22/2025        Plan:   During the course of the visit BS[place]as educated and counseled about the following appropriate screening and preventive services:   Vaccines to include Pneumoccal and Td/Tdap Zostavax not indicated as patient has had shingles;  Also patient does not usually get yearly influenza vaccine as she has a past history of adverse reaction.  Colorectal cancer screening - UTD 07/2015  Patient does not take ASA due to history of blood in stool in 2015  Cardiovascular disease screening - UTD  Diabetes screening - UTD  Bone Denisty / Osteoporosis Screening - UTD - due 01/2016  Mammogram - due now - [facility] Glaucoma screening / Eye Exam - UTD  Nutrition counseling - continue current diet  Continue to remain active.    Advanced Directives - patient has, copy requested  Referral to Physical therapy for scoliosis and back pain  No orders of the defined types were placed in this encounter.    Patient Instructions (the written plan) were given to the patient.  Cherre Robins, [facility]   11/16/2015

## 2015-11-16 NOTE — Patient Instructions (Addendum)
Lori Soto , Thank you for taking time to come for your Medicare Wellness Visit. I appreciate your ongoing commitment to your health goals. Please review the following plan we discussed and let me know if I can assist you in the future.   These are the goals we discussed:  Will send referral to physical therapy for back pain and help with core strength  Due to have mammogram - make sure to call the [facility] for appointment  Increase non-starchy vegetables - carrots, green bean, squash, zucchini, tomatoes, onions, peppers, spinach and other green leafy vegetables, cabbage, lettuce, cucumbers, asparagus, okra (not fried), eggplant Limit sugar and processed foods (cakes, cookies, ice cream, crackers and chips) Increase fresh fruit but limit serving sizes 1/2 cup or about the size of tennis or baseball Limit red meat to no more than 1-2 times per week (serving size about the size of your palm) Choose whole grains / lean proteins - whole wheat bread, quinoa, whole grain rice (1/2 cup), fish, chicken, [place] Avoid sugar and calorie containing beverages - soda, sweet tea and juice.  Choose water or unsweetened tea instead.   This is a list of the screening recommended for you and due dates:  Health Maintenance  Topic Date Due  . Mammogram  11/14/2015  . Flu Shot  Doesn't get due to history of reaction  . Pneumonia vaccines (2 of 2 - PPSV23) completed  . Shingles Vaccine  History of shingles  . DEXA scan (bone density measurement)  02/20/2016 - will send referral for later this year  . Tetanus Vaccine  01/22/2025  *Topic was postponed. The date shown is not the original due date.   Health Maintenance, Female Adopting a healthy lifestyle and getting preventive care can go a long way to promote health and wellness. Talk with your health care provider about what schedule of regular examinations is right for you. This is a good chance for you to check in with your provider about disease  prevention and staying healthy. In between checkups, there are plenty of things you can do on your own. Experts have done a lot of research about which lifestyle changes and preventive measures are most likely to keep you healthy. Ask your health care provider for more information. WEIGHT AND DIET  Eat a healthy diet  Be sure to include plenty of vegetables, fruits, low-fat dairy products, and lean protein.  Do not eat a lot of foods high in solid fats, added sugars, or salt.  Get regular exercise. This is one of the most important things you can do for your health.  Most adults should exercise for at least 150 minutes each week. The exercise should increase your heart rate and make you sweat (moderate-intensity exercise).  Most adults should also do strengthening exercises at least twice a week. This is in addition to the moderate-intensity exercise.  Maintain a healthy weight  Body mass index (BMI) is a measurement that can be used to identify possible weight problems. It estimates body fat based on height and weight. Your health care provider can help determine your BMI and help you achieve or maintain a healthy weight.  For females 46 years of age and older:   A BMI below 18.5 is considered underweight.  A BMI of 18.5 to 24.9 is normal.  A BMI of 25 to 29.9 is considered overweight.  A BMI of 30 and above is considered obese.  Watch levels of cholesterol and blood lipids  You should start  having your blood tested for lipids and cholesterol at 78 years of age, then have this test every 5 years.  You may need to have your cholesterol levels checked more often if:  Your lipid or cholesterol levels are high.  You are older than 78 years of age.  You are at high risk for heart disease.  CANCER SCREENING   Lung Cancer  Lung cancer screening is recommended for adults 65-36 years old who are at high risk for lung cancer because of a history of smoking.  A yearly low-dose  CT scan of the lungs is recommended for people who:  Currently smoke.  Have quit within the past 15 years.  Have at least a 30-pack-year history of smoking. A pack year is smoking an average of one pack of cigarettes a day for 1 year.  Yearly screening should continue until it has been 15 years since you quit.  Yearly screening should stop if you develop a health problem that would prevent you from having lung cancer treatment.  Breast Cancer  Practice breast self-awareness. This means understanding how your breasts normally appear and feel.  It also means doing regular breast self-exams. Let your health care provider know about any changes, no matter how small.  If you are in your 20s or 30s, you should have a clinical breast exam (CBE) by a health care provider every 1-3 years as part of a regular health exam.  If you are 32 or older, have a CBE every year. Also consider having a breast X-ray (mammogram) every year.  If you have a family history of breast cancer, talk to your health care provider about genetic screening.  If you are at high risk for breast cancer, talk to your health care provider about having an MRI and a mammogram every year.  Breast cancer gene (BRCA) assessment is recommended for women who have family members with BRCA-related cancers. BRCA-related cancers include:  Breast.  Ovarian.  Tubal.  Peritoneal cancers.  Results of the assessment will determine the need for genetic counseling and BRCA1 and BRCA2 testing. Cervical Cancer Your health care provider may recommend that you be screened regularly for cancer of the pelvic organs (ovaries, uterus, and vagina). This screening involves a pelvic examination, including checking for microscopic changes to the surface of your cervix (Pap test). You may be encouraged to have this screening done every 3 years, beginning at age 59.  For women ages 64-65, health care providers may recommend pelvic exams and Pap  testing every 3 years, or they may recommend the Pap and pelvic exam, combined with testing for human papilloma virus (HPV), every 5 years. Some types of HPV increase your risk of cervical cancer. Testing for HPV may also be done on women of any age with unclear Pap test results.  Other health care providers may not recommend any screening for nonpregnant women who are considered low risk for pelvic cancer and who do not have symptoms. Ask your health care provider if a screening pelvic exam is right for you.  If you have had past treatment for cervical cancer or a condition that could lead to cancer, you need Pap tests and screening for cancer for at least 20 years after your treatment. If Pap tests have been discontinued, your risk factors (such as having a new sexual partner) need to be reassessed to determine if screening should resume. Some women have medical problems that increase the chance of getting cervical cancer. In these cases, your  health care provider may recommend more frequent screening and Pap tests. Colorectal Cancer  This type of cancer can be detected and often prevented.  Routine colorectal cancer screening usually begins at 78 years of age and continues through 78 years of age.  Your health care provider may recommend screening at an earlier age if you have risk factors for colon cancer.  Your health care provider may also recommend using home test kits to check for hidden blood in the stool.  A small camera at the end of a tube can be used to examine your colon directly (sigmoidoscopy or colonoscopy). This is done to check for the earliest forms of colorectal cancer.  Routine screening usually begins at age 56.  Direct examination of the colon should be repeated every 5-10 years through 78 years of age. However, you may need to be screened more often if early forms of precancerous polyps or small growths are found. Skin Cancer  Check your skin from head to toe  regularly.  Tell your health care provider about any new moles or changes in moles, especially if there is a change in a mole's shape or color.  Also tell your health care provider if you have a mole that is larger than the size of a pencil eraser.  Always use sunscreen. Apply sunscreen liberally and repeatedly throughout the day.  Protect yourself by wearing long sleeves, pants, a wide-brimmed hat, and sunglasses whenever you are outside. HEART DISEASE, DIABETES, AND HIGH BLOOD PRESSURE   High blood pressure causes heart disease and increases the risk of stroke. High blood pressure is more likely to develop in:  People who have blood pressure in the high end of the normal range (130-139/85-89 mm Hg).  People who are overweight or obese.  People who are African American.  If you are 68-69 years of age, have your blood pressure checked every 3-5 years. If you are 89 years of age or older, have your blood pressure checked every year. You should have your blood pressure measured twice--once when you are at a hospital or clinic, and once when you are not at a hospital or clinic. Record the average of the two measurements. To check your blood pressure when you are not at a hospital or clinic, you can use:  An automated blood pressure machine at a pharmacy.  A home blood pressure monitor.  If you are between 74 years and 61 years old, ask your health care provider if you should take aspirin to prevent strokes.  Have regular diabetes screenings. This involves taking a blood sample to check your fasting blood sugar level.  If you are at a normal weight and have a low risk for diabetes, have this test once every three years after 78 years of age.  If you are overweight and have a high risk for diabetes, consider being tested at a younger age or more often. PREVENTING INFECTION  Hepatitis B  If you have a higher risk for hepatitis B, you should be screened for this virus. You are considered  at high risk for hepatitis B if:  You were born in a country where hepatitis B is common. Ask your health care provider which countries are considered high risk.  Your parents were born in a high-risk country, and you have not been immunized against hepatitis B (hepatitis B vaccine).  You have HIV or AIDS.  You use needles to inject street drugs.  You live with someone who has hepatitis B.  You have had sex with someone who has hepatitis B.  You get hemodialysis treatment.  You take certain medicines for conditions, including cancer, organ transplantation, and autoimmune conditions. Hepatitis C  Blood testing is recommended for:  Everyone born from [birth year] through 1963.  Anyone with known risk factors for hepatitis C. Sexually transmitted infections (STIs)  You should be screened for sexually transmitted infections (STIs) including gonorrhea and chlamydia if:  You are sexually active and are younger than 78 years of age.  You are older than 78 years of age and your health care provider tells you that you are at risk for this type of infection.  Your sexual activity has changed since you were last screened and you are at an increased risk for chlamydia or gonorrhea. Ask your health care provider if you are at risk.  If you do not have HIV, but are at risk, it may be recommended that you take a prescription medicine daily to prevent HIV infection. This is called pre-exposure prophylaxis (PrEP). You are considered at risk if:  You are sexually active and do not regularly use condoms or know the HIV status of your partner(s).  You take drugs by injection.  You are sexually active with a partner who has HIV. Talk with your health care provider about whether you are at high risk of being infected with HIV. If you choose to begin PrEP, you should first be tested for HIV. You should then be tested every 3 months for as long as you are taking PrEP.  PREGNANCY   If you are  premenopausal and you may become pregnant, ask your health care provider about preconception counseling.  If you may become pregnant, take 400 to 800 micrograms (mcg) of folic acid every day.  If you want to prevent pregnancy, talk to your health care provider about birth control (contraception). OSTEOPOROSIS AND MENOPAUSE   Osteoporosis is a disease in which the bones lose minerals and strength with aging. This can result in serious bone fractures. Your risk for osteoporosis can be identified using a bone density scan.  If you are 92 years of age or older, or if you are at risk for osteoporosis and fractures, ask your health care provider if you should be screened.  Ask your health care provider whether you should take a calcium or vitamin D supplement to lower your risk for osteoporosis.  Menopause may have certain physical symptoms and risks.  Hormone replacement therapy may reduce some of these symptoms and risks. Talk to your health care provider about whether hormone replacement therapy is right for you.  HOME CARE INSTRUCTIONS   Schedule regular health, dental, and eye exams.  Stay current with your immunizations.   Do not use any tobacco products including cigarettes, chewing tobacco, or electronic cigarettes.  If you are pregnant, do not drink alcohol.  If you are breastfeeding, limit how much and how often you drink alcohol.  Limit alcohol intake to no more than 1 drink per day for nonpregnant women. One drink equals 12 ounces of beer, 5 ounces of wine, or 1 ounces of hard liquor.  Do not use street drugs.  Do not share needles.  Ask your health care provider for help if you need support or information about quitting drugs.  Tell your health care provider if you often feel depressed.  Tell your health care provider if you have ever been abused or do not feel safe at home.   This information is not intended  to replace advice given to you by your health care  provider. Make sure you discuss any questions you have with your health care provider.   Document Released: 11/01/2010 Document Revised: 05/09/2014 Document Reviewed: 03/20/2013 Elsevier Interactive Patient Education [employer].

## 2015-11-23 DIAGNOSIS — Z1231 Encounter for screening mammogram for malignant neoplasm of breast: Secondary | ICD-10-CM | POA: Diagnosis not present

## 2015-11-24 ENCOUNTER — Ambulatory Visit: Payer: Medicare Other | Attending: Family Medicine | Admitting: Physical Therapy

## 2015-11-24 DIAGNOSIS — M546 Pain in thoracic spine: Secondary | ICD-10-CM | POA: Diagnosis not present

## 2015-11-24 DIAGNOSIS — R293 Abnormal posture: Secondary | ICD-10-CM

## 2015-11-24 DIAGNOSIS — M545 Low back pain, unspecified: Secondary | ICD-10-CM

## 2015-11-24 NOTE — Therapy (Signed)
[facility] [place], [place], [address] Phone: [phone]   Fax:  [phone]  Physical Therapy Evaluation  Patient Details  Name: Lori Soto MRN: [id number] Date of Birth: 23-Nov-1935 Referring Provider: Redge Gainer MD  Encounter Date: 11/24/2015      PT End of Session - 11/24/15 1300    Visit Number 1   Number of Visits 24   Date for PT Re-Evaluation 01/23/16   PT Start Time 0953   PT Stop Time 1045   PT Time Calculation (min) 52 min   Activity Tolerance Patient tolerated treatment well   Behavior During Therapy [facility] for tasks assessed/performed      Past Medical History:  Diagnosis Date  . Anemia   . Cancer ([place])    skin cancer on nose  . Carpal tunnel syndrome, bilateral   . Cataract   . Esophagitis   . GERD (gastroesophageal reflux disease)   . H/O hiatal hernia   . Osteopenia   . Scoliosis   . Shingles   . Shingles   . Ulcerative colitis     Past Surgical History:  Procedure Laterality Date  . ABDOMINAL HYSTERECTOMY    . BUNIONECTOMY     Bilateral  . CARPAL TUNNEL RELEASE Left 03/10/2015   Procedure: LEFT CARPAL TUNNEL RELEASE;  Surgeon: Daryll Brod, MD;  Location: [facility];  Service: Orthopedics;  Laterality: Left;  . CATARACT EXTRACTION W/PHACO  05/28/2012   Procedure: CATARACT EXTRACTION PHACO AND INTRAOCULAR LENS PLACEMENT (IOC);  Surgeon: Tonny Branch, MD;  [facility];  Service: Ophthalmology;  Laterality: Right;  CDE=12.84  . CATARACT EXTRACTION W/PHACO  06/07/2012   Procedure: CATARACT EXTRACTION PHACO AND INTRAOCULAR LENS PLACEMENT (IOC);  Surgeon: Tonny Branch, MD;  [facility];  Service: Ophthalmology;  Laterality: Left;  CDE 17.60  . COLONOSCOPY    . COLONOSCOPY N/A 07/08/2015   Procedure: COLONOSCOPY;  Surgeon: Rogene Houston, MD;  [facility];  Service: Endoscopy;  Laterality: N/A;  930  . ESOPHAGEAL DILATION N/A 07/08/2015   Procedure: ESOPHAGEAL  DILATION;  Surgeon: Rogene Houston, MD;  [facility];  Service: Endoscopy;  Laterality: N/A;  . ESOPHAGOGASTRODUODENOSCOPY N/A 07/08/2015   Procedure: ESOPHAGOGASTRODUODENOSCOPY (EGD);  Surgeon: Rogene Houston, MD;  [facility];  Service: Endoscopy;  Laterality: N/A;  . Hernia     Right inguinal  . HERNIA REPAIR  1961   right inguinal hernia  . UPPER GASTROINTESTINAL ENDOSCOPY      There were no vitals filed for this visit.       Subjective Assessment - 11/24/15 1258    Subjective Let's try traction again.  I weight 137#.   Limitations Standing   How long can you stand comfortably? 20 minutes.   Pain Score 6    Pain Location Back   Pain Descriptors / Indicators Aching;Constant   Pain Type Chronic pain   Pain Onset More than a month ago   Pain Frequency Constant   Aggravating Factors  Standing too long.   Pain Relieving Factors Traction.   Effect of Pain on Daily Activities Have to pace myself.            [facility] PT Assessment - 11/24/15 0001      Assessment   Medical Diagnosis Scoliosis.   Referring Provider Redge Gainer MD   Onset Date/Surgical Date --  Ongoing.     Precautions   Precautions None     Restrictions  Weight Bearing Restrictions No     Balance Screen   Has the patient fallen in the past 6 months No   Has the patient had a decrease in activity level because of a fear of falling?  Yes   Is the patient reluctant to leave their home because of a fear of falling?  No     Home Environment   Living Environment Private residence     Prior Function   Level of Independence Independent     Posture/Postural Control   Posture Comments Thoracic convexity on left and right convexity lumbar spine.  Right iliac creast higher.     ROM / Strength   AROM / PROM / Strength AROM;Strength     AROM   Overall AROM Comments 9 degrees of lumbar extension and intervertebral flexion limited by 80% moving into active flexion.     Strength    Overall Strength Comments Normal bilateral LE strength.     Palpation   Palpation comment Diffuse bilateral thoracic and lumbar musculature pain reported but especially in the right lower lumbar region.     Special Tests   Lumbar Tests --  Negative SLR testing.     Ambulation/Gait   Gait Comments The patient walk in spinal flexion due to spinal deformity and "S" curve.                   [facility] Adult PT Treatment/Exercise - 11/24/15 0001      Modalities   Modalities Traction     Traction   Type of Traction Lumbar   Min (lbs) 5   Max (lbs) 70   Hold Time 99   Rest Time 5   Time 15                  PT Short Term Goals - 11/24/15 1422      PT SHORT TERM GOAL #1   Title Ind with a HEP.   Time 2   Period Weeks   Status New           PT Long Term Goals - 11/24/15 1422      PT LONG TERM GOAL #1   Title Perform ADL's with pain not > 3/10.   Time 8   Period Weeks   Status New     PT LONG TERM GOAL #2   Title Stand 45 minutes with pain not > 3-4/10.   Time 8   Period Weeks   Status New               Plan - 11/24/15 1253    Clinical Impression Statement The patint presents to the clinic today with a long standing h/o spinal pain that is progressing and evolving.  She has been seen in the past and soft tissuw work and traction has been helpful.  Patient demonstrated a significant loss of intervertebral movement and her scoliosis is pronounced.  Her resting pain-level is a 6/10 today but can rise easily to 7-8/10 especially if standing too long.  Her most painful area reported today is in her right lumbar and thoracic area.     Rehab Potential Fair   Clinical Impairments Affecting Rehab Potential Ongoing and progressing spinal pain over many years.   PT Frequency 2x / week   PT Duration 8 weeks   PT Treatment/Interventions ADLs/Self Care Home Management;Cryotherapy;Electrical Stimulation;Ultrasound;Traction;Moist Heat;Therapeutic  activities;Therapeutic exercise   PT Next Visit Plan the patient has responded favorably to traction and STW/M in  the past.  Please begin with this.  Can progress into core exercises.   Consulted and Agree with Plan of Care Patient      Patient will benefit from skilled therapeutic intervention in order to improve the following deficits and impairments:  Decreased activity tolerance, Decreased range of motion, Pain (Abnormal posture.)  Visit Diagnosis: Right-sided low back pain without sciatica - Plan: PT plan of care cert/re-cert  Pain in thoracic spine - Plan: PT plan of care cert/re-cert  Abnormal posture - Plan: PT plan of care cert/re-cert      G-Codes - 32/25/67 1303    Functional Assessment Tool Used FOTO...59% limitation.   Functional Limitation Mobility: Walking and moving around   Mobility: Walking and Moving Around Current Status [phone]) At least 40 percent but less than 60 percent impaired, limited or restricted   Mobility: Walking and Moving Around Goal Status [phone]) At least 20 percent but less than 40 percent impaired, limited or restricted       Problem List Patient Active Problem List   Diagnosis Date Noted  . Osteopenia of the elderly 02/19/2014  . DDD (degenerative disc disease), lumbar 12/03/2013  . Scoliosis 12/03/2013  . Chronic LBP 05/14/2013  . IBS (irritable bowel syndrome) 11/08/2012  . UC (ulcerative colitis) ([place]) 04/23/2012  . Palpitation 10/26/2011  . Hypertension 10/26/2011  . Chronic ulcerative colitis ([place]) 03/23/2011    Antia Rahal, [place] MPT 11/24/2015, 2:27 PM  [facility] [address] [place], [place], [address] Phone: [phone]   Fax:  [phone]  Name: Lori Soto MRN: [id number] Date of Birth: 1935-07-15

## 2015-11-30 ENCOUNTER — Ambulatory Visit: Payer: Medicare Other | Admitting: Physical Therapy

## 2015-11-30 ENCOUNTER — Encounter: Payer: Self-pay | Admitting: Physical Therapy

## 2015-11-30 DIAGNOSIS — R293 Abnormal posture: Secondary | ICD-10-CM | POA: Diagnosis not present

## 2015-11-30 DIAGNOSIS — M545 Low back pain, unspecified: Secondary | ICD-10-CM

## 2015-11-30 DIAGNOSIS — M9905 Segmental and somatic dysfunction of pelvic region: Secondary | ICD-10-CM | POA: Diagnosis not present

## 2015-11-30 DIAGNOSIS — M546 Pain in thoracic spine: Secondary | ICD-10-CM | POA: Diagnosis not present

## 2015-11-30 DIAGNOSIS — M5137 Other intervertebral disc degeneration, lumbosacral region: Secondary | ICD-10-CM | POA: Diagnosis not present

## 2015-11-30 NOTE — Therapy (Signed)
[facility] [place], [place], [address] Phone: [phone]   Fax:  [phone]  Physical Therapy Treatment  Patient Details  Name: Matika Bartell MRN: [id number] Date of Birth: [birth year] Referring Provider: Redge Gainer MD  Encounter Date: 11/30/2015      PT End of Session - 11/30/15 1036    Visit Number 2   Number of Visits 24   Date for PT Re-Evaluation 01/23/16   PT Start Time 1036   PT Stop Time 1126   PT Time Calculation (min) 50 min   Activity Tolerance Patient tolerated treatment well   Behavior During Therapy [facility] for tasks assessed/performed      Past Medical History:  Diagnosis Date  . Anemia   . Cancer ([place])    skin cancer on nose  . Carpal tunnel syndrome, bilateral   . Cataract   . Esophagitis   . GERD (gastroesophageal reflux disease)   . H/O hiatal hernia   . Osteopenia   . Scoliosis   . Shingles   . Shingles   . Ulcerative colitis     Past Surgical History:  Procedure Laterality Date  . ABDOMINAL HYSTERECTOMY    . BUNIONECTOMY     Bilateral  . CARPAL TUNNEL RELEASE Left 03/10/2015   Procedure: LEFT CARPAL TUNNEL RELEASE;  Surgeon: Daryll Brod, MD;  Location: [facility];  Service: Orthopedics;  Laterality: Left;  . CATARACT EXTRACTION W/PHACO  05/28/2012   Procedure: CATARACT EXTRACTION PHACO AND INTRAOCULAR LENS PLACEMENT (IOC);  Surgeon: Tonny Branch, MD;  [facility];  Service: Ophthalmology;  Laterality: Right;  CDE=12.84  . CATARACT EXTRACTION W/PHACO  06/07/2012   Procedure: CATARACT EXTRACTION PHACO AND INTRAOCULAR LENS PLACEMENT (IOC);  Surgeon: Tonny Branch, MD;  [facility];  Service: Ophthalmology;  Laterality: Left;  CDE 17.60  . COLONOSCOPY    . COLONOSCOPY N/A 07/08/2015   Procedure: COLONOSCOPY;  Surgeon: Rogene Houston, MD;  [facility];  Service: Endoscopy;  Laterality: N/A;  930  . ESOPHAGEAL DILATION N/A 07/08/2015   Procedure: ESOPHAGEAL  DILATION;  Surgeon: Rogene Houston, MD;  [facility];  Service: Endoscopy;  Laterality: N/A;  . ESOPHAGOGASTRODUODENOSCOPY N/A 07/08/2015   Procedure: ESOPHAGOGASTRODUODENOSCOPY (EGD);  Surgeon: Rogene Houston, MD;  [facility];  Service: Endoscopy;  Laterality: N/A;  . Hernia     Right inguinal  . HERNIA REPAIR  1961   right inguinal hernia  . UPPER GASTROINTESTINAL ENDOSCOPY      There were no vitals filed for this visit.      Subjective Assessment - 11/30/15 1027    Subjective As the day goes on pain increases and pain is in her neck.   Limitations Standing   How long can you stand comfortably? 20 minutes.   Currently in Pain? Yes   Pain Score 4    Pain Location Back   Pain Orientation Lower   Pain Type Chronic pain   Pain Onset More than a month ago            [facility] PT Assessment - 11/30/15 0001      Assessment   Medical Diagnosis Scoliosis.     Precautions   Precautions None     Restrictions   Weight Bearing Restrictions No                     [facility] Adult PT Treatment/Exercise - 11/30/15 0001  Modalities   Modalities Ultrasound;Traction     Ultrasound   Ultrasound Location R lumbar paraspinals   Ultrasound Parameters 1.5 w/cm2, 100%, 1 mhz x10 min   Ultrasound Goals Pain     Traction   Type of Traction Lumbar   Min (lbs) 5   Max (lbs) 70   Hold Time 99   Rest Time 5   Time 15     Manual Therapy   Manual Therapy Soft tissue mobilization   Soft tissue mobilization STW/ MFR to R lumbar paraspinals/QL to decrease tightness while lying in L sidelying to decrease tightness and pain                  PT Short Term Goals - 11/24/15 1422      PT SHORT TERM GOAL #1   Title Ind with a HEP.   Time 2   Period Weeks   Status New           PT Long Term Goals - 11/24/15 1422      PT LONG TERM GOAL #1   Title Perform ADL's with pain not > 3/10.   Time 8   Period Weeks   Status New     PT LONG  TERM GOAL #2   Title Stand 45 minutes with pain not > 3-4/10.   Time 8   Period Weeks   Status New               Plan - 11/30/15 1121    Clinical Impression Statement Patient arrived to treatment with continued low back pain and complaints of lower legs feeling weak after prolonged sitting. Patient presented in clinic with increased tightness along R lumbar paraspinals musculature that may be increased by patient's scoliosis. Normal modality response to [place] following end of [place] session. Mechanical traction maintained at 70# max with no complaints following end of traction per patient report. Patient experienced low back feeling "better" following end of treatment.   Clinical Impairments Affecting Rehab Potential Ongoing and progressing spinal pain over many years.   PT Frequency 2x / week   PT Duration 8 weeks   PT Treatment/Interventions ADLs/Self Care Home Management;Cryotherapy;Electrical Stimulation;Ultrasound;Traction;Moist Heat;Therapeutic activities;Therapeutic exercise   PT Next Visit Plan Continue with US/STW to low back as well as traction per MPT POC.   Consulted and Agree with Plan of Care Patient      Patient will benefit from skilled therapeutic intervention in order to improve the following deficits and impairments:  Decreased activity tolerance, Decreased range of motion, Pain  Visit Diagnosis: Right-sided low back pain without sciatica  Pain in thoracic spine  Abnormal posture     Problem List Patient Active Problem List   Diagnosis Date Noted  . Osteopenia of the elderly 02/19/2014  . DDD (degenerative disc disease), lumbar 12/03/2013  . Scoliosis 12/03/2013  . Chronic LBP 05/14/2013  . IBS (irritable bowel syndrome) 11/08/2012  . UC (ulcerative colitis) ([place]) 04/23/2012  . Palpitation 10/26/2011  . Hypertension 10/26/2011  . Chronic ulcerative colitis ([place]) 03/23/2011    Wynelle Fanny, PTA 11/30/2015, 11:32 AM  [facility] [address] [place], [place], [address] Phone: [phone]   Fax:  [phone]  Name: Angla Delahunt MRN: [id number] Date of Birth: [birth year]

## 2015-12-01 DIAGNOSIS — M9905 Segmental and somatic dysfunction of pelvic region: Secondary | ICD-10-CM | POA: Diagnosis not present

## 2015-12-01 DIAGNOSIS — M5137 Other intervertebral disc degeneration, lumbosacral region: Secondary | ICD-10-CM | POA: Diagnosis not present

## 2015-12-02 ENCOUNTER — Ambulatory Visit: Payer: Medicare Other | Attending: Family Medicine | Admitting: Physical Therapy

## 2015-12-02 DIAGNOSIS — M546 Pain in thoracic spine: Secondary | ICD-10-CM | POA: Diagnosis not present

## 2015-12-02 DIAGNOSIS — M545 Low back pain, unspecified: Secondary | ICD-10-CM

## 2015-12-02 DIAGNOSIS — R293 Abnormal posture: Secondary | ICD-10-CM | POA: Diagnosis not present

## 2015-12-02 NOTE — Therapy (Signed)
[facility] [place], [place], [address] Phone: [phone]   Fax:  [phone]  Physical Therapy Treatment  Patient Details  Name: Lori Soto MRN: [id number] Date of Birth: [birth year] Referring Provider: Redge Gainer MD  Encounter Date: 12/02/2015      PT End of Session - 12/02/15 1404    Visit Number 3   Number of Visits 24   Date for PT Re-Evaluation 01/23/16   PT Start Time 0101   PT Stop Time 0155   PT Time Calculation (min) 54 min   Activity Tolerance Patient tolerated treatment well   Behavior During Therapy [facility] for tasks assessed/performed      Past Medical History:  Diagnosis Date  . Anemia   . Cancer ([place])    skin cancer on nose  . Carpal tunnel syndrome, bilateral   . Cataract   . Esophagitis   . GERD (gastroesophageal reflux disease)   . H/O hiatal hernia   . Osteopenia   . Scoliosis   . Shingles   . Shingles   . Ulcerative colitis     Past Surgical History:  Procedure Laterality Date  . ABDOMINAL HYSTERECTOMY    . BUNIONECTOMY     Bilateral  . CARPAL TUNNEL RELEASE Left 03/10/2015   Procedure: LEFT CARPAL TUNNEL RELEASE;  Surgeon: Daryll Brod, MD;  Location: [place];  Service: Orthopedics;  Laterality: Left;  . CATARACT EXTRACTION W/PHACO  05/28/2012   Procedure: CATARACT EXTRACTION PHACO AND INTRAOCULAR LENS PLACEMENT (IOC);  Surgeon: Tonny Branch, MD;  [facility];  Service: Ophthalmology;  Laterality: Right;  CDE=12.84  . CATARACT EXTRACTION W/PHACO  06/07/2012   Procedure: CATARACT EXTRACTION PHACO AND INTRAOCULAR LENS PLACEMENT (IOC);  Surgeon: Tonny Branch, MD;  [facility];  Service: Ophthalmology;  Laterality: Left;  CDE 17.60  . COLONOSCOPY    . COLONOSCOPY N/A 07/08/2015   Procedure: COLONOSCOPY;  Surgeon: Rogene Houston, MD;  [facility];  Service: Endoscopy;  Laterality: N/A;  930  . ESOPHAGEAL DILATION N/A 07/08/2015   Procedure: ESOPHAGEAL  DILATION;  Surgeon: Rogene Houston, MD;  [facility];  Service: Endoscopy;  Laterality: N/A;  . ESOPHAGOGASTRODUODENOSCOPY N/A 07/08/2015   Procedure: ESOPHAGOGASTRODUODENOSCOPY (EGD);  Surgeon: Rogene Houston, MD;  [facility];  Service: Endoscopy;  Laterality: N/A;  . Hernia     Right inguinal  . HERNIA REPAIR  1961   right inguinal hernia  . UPPER GASTROINTESTINAL ENDOSCOPY      There were no vitals filed for this visit.      Subjective Assessment - 12/02/15 1357    Subjective Doing okay.   Pain Score 4    Pain Location Back   Pain Orientation Lower   Pain Descriptors / Indicators Aching;Constant   Pain Type Chronic pain   Pain Onset More than a month ago    Treatment:  In prone:  STW/M x 25 minutes to right low back and affected thoracic region f/b Int traction at 75# x 15 minutes (99 sec on and 5 sec off).  Patient did great today.                               PT Short Term Goals - 11/24/15 1422      PT SHORT TERM GOAL #1   Title Ind with a HEP.   Time 2   Period Weeks  Status New           PT Long Term Goals - 11/24/15 1422      PT LONG TERM GOAL #1   Title Perform ADL's with pain not > 3/10.   Time 8   Period Weeks   Status New     PT LONG TERM GOAL #2   Title Stand 45 minutes with pain not > 3-4/10.   Time 8   Period Weeks   Status New             Patient will benefit from skilled therapeutic intervention in order to improve the following deficits and impairments:  Decreased activity tolerance, Decreased range of motion, Pain  Visit Diagnosis: Right-sided low back pain without sciatica  Pain in thoracic spine  Abnormal posture     Problem List Patient Active Problem List   Diagnosis Date Noted  . Osteopenia of the elderly 02/19/2014  . DDD (degenerative disc disease), lumbar 12/03/2013  . Scoliosis 12/03/2013  . Chronic LBP 05/14/2013  . IBS (irritable bowel syndrome)  11/08/2012  . UC (ulcerative colitis) ([place]) 04/23/2012  . Palpitation 10/26/2011  . Hypertension 10/26/2011  . Chronic ulcerative colitis ([place]) 03/23/2011    Lori Soto, [place] MPT 12/02/2015, 2:06 PM  [facility] [address] [place], [place], [address] Phone: [phone]   Fax:  [phone]  Name: Lori Soto MRN: [id number] Date of Birth: 02/22/1936

## 2015-12-03 DIAGNOSIS — M5137 Other intervertebral disc degeneration, lumbosacral region: Secondary | ICD-10-CM | POA: Diagnosis not present

## 2015-12-03 DIAGNOSIS — M9905 Segmental and somatic dysfunction of pelvic region: Secondary | ICD-10-CM | POA: Diagnosis not present

## 2015-12-07 ENCOUNTER — Encounter: Payer: Medicare Other | Admitting: Physical Therapy

## 2015-12-07 DIAGNOSIS — M9905 Segmental and somatic dysfunction of pelvic region: Secondary | ICD-10-CM | POA: Diagnosis not present

## 2015-12-07 DIAGNOSIS — M5137 Other intervertebral disc degeneration, lumbosacral region: Secondary | ICD-10-CM | POA: Diagnosis not present

## 2015-12-08 ENCOUNTER — Ambulatory Visit: Payer: Medicare Other | Admitting: Physical Therapy

## 2015-12-08 ENCOUNTER — Encounter: Payer: Self-pay | Admitting: Physical Therapy

## 2015-12-08 DIAGNOSIS — R293 Abnormal posture: Secondary | ICD-10-CM

## 2015-12-08 DIAGNOSIS — M546 Pain in thoracic spine: Secondary | ICD-10-CM

## 2015-12-08 DIAGNOSIS — M545 Low back pain, unspecified: Secondary | ICD-10-CM

## 2015-12-08 NOTE — Therapy (Signed)
[facility] [place], [place], [address] Phone: [phone]   Fax:  [phone]  Physical Therapy Treatment  Patient Details  Name: Lori Soto MRN: [id number] Date of Birth: 03/19/1936 Referring Provider: Redge Gainer MD  Encounter Date: 12/08/2015      PT End of Session - 12/08/15 1425    Visit Number 4   Number of Visits 24   Date for PT Re-Evaluation 01/23/16   PT Start Time 1350   PT Stop Time 1436   PT Time Calculation (min) 46 min   Activity Tolerance Patient tolerated treatment well   Behavior During Therapy [facility] for tasks assessed/performed      Past Medical History:  Diagnosis Date  . Anemia   . Cancer ([place])    skin cancer on nose  . Carpal tunnel syndrome, bilateral   . Cataract   . Esophagitis   . GERD (gastroesophageal reflux disease)   . H/O hiatal hernia   . Osteopenia   . Scoliosis   . Shingles   . Shingles   . Ulcerative colitis     Past Surgical History:  Procedure Laterality Date  . ABDOMINAL HYSTERECTOMY    . BUNIONECTOMY     Bilateral  . CARPAL TUNNEL RELEASE Left 03/10/2015   Procedure: LEFT CARPAL TUNNEL RELEASE;  Surgeon: Daryll Brod, MD;  Location: [facility];  Service: Orthopedics;  Laterality: Left;  . CATARACT EXTRACTION W/PHACO  05/28/2012   Procedure: CATARACT EXTRACTION PHACO AND INTRAOCULAR LENS PLACEMENT (IOC);  Surgeon: Tonny Branch, MD;  [facility];  Service: Ophthalmology;  Laterality: Right;  CDE=12.84  . CATARACT EXTRACTION W/PHACO  06/07/2012   Procedure: CATARACT EXTRACTION PHACO AND INTRAOCULAR LENS PLACEMENT (IOC);  Surgeon: Tonny Branch, MD;  [facility];  Service: Ophthalmology;  Laterality: Left;  CDE 17.60  . COLONOSCOPY    . COLONOSCOPY N/A 07/08/2015   Procedure: COLONOSCOPY;  Surgeon: Rogene Houston, MD;  [facility];  Service: Endoscopy;  Laterality: N/A;  930  . ESOPHAGEAL DILATION N/A 07/08/2015   Procedure: ESOPHAGEAL  DILATION;  Surgeon: Rogene Houston, MD;  [facility];  Service: Endoscopy;  Laterality: N/A;  . ESOPHAGOGASTRODUODENOSCOPY N/A 07/08/2015   Procedure: ESOPHAGOGASTRODUODENOSCOPY (EGD);  Surgeon: Rogene Houston, MD;  [facility];  Service: Endoscopy;  Laterality: N/A;  . Hernia     Right inguinal  . HERNIA REPAIR  1961   right inguinal hernia  . UPPER GASTROINTESTINAL ENDOSCOPY      There were no vitals filed for this visit.      Subjective Assessment - 12/08/15 1424    Subjective Reports that her low back is not bad today as she has been inside doing paperwork for today. Reports no stabbing sensations today.   Limitations Standing   How long can you stand comfortably? 20 minutes.   Currently in Pain? Yes   Pain Score 5    Pain Location Back   Pain Orientation Lower   Pain Type Chronic pain   Pain Onset More than a month ago                         [facility] Adult PT Treatment/Exercise - 12/08/15 0001      Modalities   Modalities Ultrasound;Traction     Ultrasound   Ultrasound Location R Lumbar Paraspinals   Ultrasound Parameters 1.5 w/cm2, 100%, 1 mhz x10 min   Ultrasound Goals Pain  Traction   Type of Traction Lumbar   Min (lbs) 5   Max (lbs) 70   Hold Time 99   Rest Time 5   Time 15     Manual Therapy   Manual Therapy Soft tissue mobilization   Soft tissue mobilization STW/ MFR to R lumbar paraspinals/QL to decrease tightness while lying in L sidelying to decrease tightness and pain                  PT Short Term Goals - 11/24/15 1422      PT SHORT TERM GOAL #1   Title Ind with a HEP.   Time 2   Period Weeks   Status New           PT Long Term Goals - 11/24/15 1422      PT LONG TERM GOAL #1   Title Perform ADL's with pain not > 3/10.   Time 8   Period Weeks   Status New     PT LONG TERM GOAL #2   Title Stand 45 minutes with pain not > 3-4/10.   Time 8   Period Weeks   Status New                Plan - 12/08/15 1451    Clinical Impression Statement Patient arrived today with mid level low back pain as she had not been doing much today due to weather. Patient continues to present with tightness in R QL and patient reported soreness in R low back/ upper glute region. Normal modalities response noted following removal of the modalities. Goals remain on-going at this time secondary to pain experienced by patient.   Rehab Potential Fair   Clinical Impairments Affecting Rehab Potential Ongoing and progressing spinal pain over many years.   PT Frequency 2x / week   PT Duration 8 weeks   PT Treatment/Interventions ADLs/Self Care Home Management;Cryotherapy;Electrical Stimulation;Ultrasound;Traction;Moist Heat;Therapeutic activities;Therapeutic exercise   PT Next Visit Plan Continue with US/STW to low back as well as traction per MPT POC.   Consulted and Agree with Plan of Care Patient      Patient will benefit from skilled therapeutic intervention in order to improve the following deficits and impairments:  Decreased activity tolerance, Decreased range of motion, Pain  Visit Diagnosis: Right-sided low back pain without sciatica  Pain in thoracic spine  Abnormal posture     Problem List Patient Active Problem List   Diagnosis Date Noted  . Osteopenia of the elderly 02/19/2014  . DDD (degenerative disc disease), lumbar 12/03/2013  . Scoliosis 12/03/2013  . Chronic LBP 05/14/2013  . IBS (irritable bowel syndrome) 11/08/2012  . UC (ulcerative colitis) ([place]) 04/23/2012  . Palpitation 10/26/2011  . Hypertension 10/26/2011  . Chronic ulcerative colitis ([place]) 03/23/2011    Wynelle Fanny, PTA 12/08/2015, 2:56 PM  [facility] [place], [place], [address] Phone: [phone]   Fax:  [phone]  Name: Lori Soto MRN: [id number] Date of Birth: Jun 04, 1935

## 2015-12-09 DIAGNOSIS — M9905 Segmental and somatic dysfunction of pelvic region: Secondary | ICD-10-CM | POA: Diagnosis not present

## 2015-12-09 DIAGNOSIS — M5137 Other intervertebral disc degeneration, lumbosacral region: Secondary | ICD-10-CM | POA: Diagnosis not present

## 2015-12-10 DIAGNOSIS — M5137 Other intervertebral disc degeneration, lumbosacral region: Secondary | ICD-10-CM | POA: Diagnosis not present

## 2015-12-10 DIAGNOSIS — M9905 Segmental and somatic dysfunction of pelvic region: Secondary | ICD-10-CM | POA: Diagnosis not present

## 2015-12-11 ENCOUNTER — Encounter: Payer: Self-pay | Admitting: Family Medicine

## 2015-12-11 ENCOUNTER — Ambulatory Visit: Payer: Medicare Other | Admitting: Physical Therapy

## 2015-12-11 ENCOUNTER — Ambulatory Visit (INDEPENDENT_AMBULATORY_CARE_PROVIDER_SITE_OTHER): Payer: Medicare Other

## 2015-12-11 ENCOUNTER — Ambulatory Visit (INDEPENDENT_AMBULATORY_CARE_PROVIDER_SITE_OTHER): Payer: Medicare Other | Admitting: Family Medicine

## 2015-12-11 VITALS — BP 128/84 | HR 66 | Temp 97.8°F | Ht 63.0 in | Wt 135.0 lb

## 2015-12-11 DIAGNOSIS — M419 Scoliosis, unspecified: Secondary | ICD-10-CM | POA: Diagnosis not present

## 2015-12-11 DIAGNOSIS — M545 Low back pain, unspecified: Secondary | ICD-10-CM

## 2015-12-11 DIAGNOSIS — R293 Abnormal posture: Secondary | ICD-10-CM | POA: Diagnosis not present

## 2015-12-11 DIAGNOSIS — I10 Essential (primary) hypertension: Secondary | ICD-10-CM | POA: Diagnosis not present

## 2015-12-11 DIAGNOSIS — M546 Pain in thoracic spine: Secondary | ICD-10-CM

## 2015-12-11 DIAGNOSIS — E559 Vitamin D deficiency, unspecified: Secondary | ICD-10-CM | POA: Diagnosis not present

## 2015-12-11 DIAGNOSIS — R1310 Dysphagia, unspecified: Secondary | ICD-10-CM | POA: Diagnosis not present

## 2015-12-11 DIAGNOSIS — K51918 Ulcerative colitis, unspecified with other complication: Secondary | ICD-10-CM

## 2015-12-11 DIAGNOSIS — M25561 Pain in right knee: Secondary | ICD-10-CM

## 2015-12-11 DIAGNOSIS — G8929 Other chronic pain: Secondary | ICD-10-CM | POA: Diagnosis not present

## 2015-12-11 DIAGNOSIS — M795 Residual foreign body in soft tissue: Secondary | ICD-10-CM | POA: Diagnosis not present

## 2015-12-11 MED ORDER — MUPIROCIN 2 % EX OINT: 1.0000 "application " | TOPICAL_OINTMENT | Freq: Two times a day (BID) | CUTANEOUS | 1 refills | Status: DC

## 2015-12-11 NOTE — Progress Notes (Signed)
Subjective:    Patient ID: Lori Soto, female    DOB: 09/22/1935, 80 y.o.   MRN: [id number]  HPI  Pt here for follow up and management of chronic medical problems which includes hypertension. She is taking medications regularly. The patient does complain of some epigastric pain and a skin lesion on her abdomen. She is due to get lab work today and a chest x-ray today. She does not need any refills. Recheck of her blood pressure was better. The patient denies any chest pain or shortness of breath. She has no trouble with swallowing and is currently taking medication from the gastroenterologist for her stomach. She also has a history of ulcerative colitis. She has irritable bowel syndrome and does see the gastroenterologist regularly. Her bowels are moving regularly and there is no blood in the stool or black tarry bowel movements. She is passing her water without problems. She noticed [place] was [place] the beach in [place] that there was an irritated area on her skin that this is sore and if anything rubs at that it hurts. Years ago in the 1960s she had a right inguinal hernia repaired.    Patient Active Problem List   Diagnosis Date Noted  . Osteopenia of the elderly 02/19/2014  . DDD (degenerative disc disease), lumbar 12/03/2013  . Scoliosis 12/03/2013  . Chronic LBP 05/14/2013  . IBS (irritable bowel syndrome) 11/08/2012  . UC (ulcerative colitis) ([place]) 04/23/2012  . Palpitation 10/26/2011  . Hypertension 10/26/2011  . Chronic ulcerative colitis ([place]) 03/23/2011   Outpatient Encounter Prescriptions as of 12/11/2015  Medication Sig  . Bromelains 500 MG TABS Take 500 mg by mouth daily.  . Calcium Carbonate-Vitamin D (CALCIUM 600+D) 600-400 MG-UNIT per tablet Take 1 tablet by mouth every evening.  [name] 500 MG CAPS Take 1 capsule by mouth daily.   . Coenzyme Q10 (CO Q-10 PO) Take 1 tablet by mouth 2 (two) times daily. With red yeast rice [employer] Brand  . COLLAGEN PO Take 1  tablet by mouth daily. This is known as 1-2-3  . Crisaborole (EUCRISA) 2 % OINT Apply 1 drop topically daily.  . Cyanocobalamin (VITAMIN B 12 PO) Take 1,000 mcg by mouth daily.   [name] estradiol (ESTRACE) 1 MG tablet Take 1 mg by mouth every other day.   . Ginkgo Biloba 40 MG TABS Take 40 mg by mouth daily.   [name] glucosamine-chondroitin 500-400 MG tablet Take 1 tablet by mouth 2 (two) times daily.    [name] Hyaluronic Acid-Vitamin C (HYALURONIC ACID PO) Take 80 mg by mouth daily.  . Mesalamine (ASACOL HD) 800 MG TBEC Take 1-2 tablets by mouth 2 (two) times daily. 2 tablets in the morning and 1 tablet in the evening.  [name] MILK THISTLE PO Take 100 mg by mouth daily.  . Misc Natural Products (BUTCHERS BROOM PO) Take 1 tablet by mouth daily.   . MULTIPLE VITAMINS PO Take 1 tablet by mouth daily. In the mornings  . Nutritional Supplements (GRAPESEED EXTRACT PO) Take 60 mg by mouth daily.   [name] OAT BRAN SOLUBLE PO Take 1 tablet by mouth daily.  [name] OVER THE COUNTER MEDICATION Take 66 mg by mouth daily. Vision Gold Lutein  . OVER THE COUNTER MEDICATION Take 250 mg by mouth daily. Alphalipoic Acid 250 mg daily  . pantoprazole (PROTONIX) 40 MG tablet Take 1 tablet (40 mg total) by mouth daily before breakfast.  . POTASSIUM GLUCONATE PO Take 99 mg by mouth daily. This  is Chelated Potassium -Puritans Pride  . pyridOXINE (VITAMIN B-6) 100 MG tablet Take 100 mg by mouth daily.   . Turmeric 500 MG CAPS Take 1 capsule by mouth 3 (three) times daily.   . Vitamins C E (CRANBERRY CONCENTRATE PO) Take 2,500 mg by mouth daily.   . Zinc 50 MG CAPS Take 50 mg by mouth daily.    No facility-administered encounter medications on file as of 12/11/2015.       Review of Systems  Constitutional: Negative.   HENT: Negative.   Eyes: Negative.   Respiratory: Negative.   Cardiovascular: Negative.   Gastrointestinal: Positive for abdominal pain (possible hernia).  Endocrine: Negative.   Genitourinary: Negative.   Musculoskeletal:  Negative.   Skin: Negative.        Lesion on belly   Allergic/Immunologic: Negative.   Neurological: Negative.   Hematological: Negative.   Psychiatric/Behavioral: Negative.        Objective:   Physical Exam  Constitutional: She is oriented to person, place, and time. She appears well-developed and well-nourished. No distress.  Pleasant and alert  HENT:  Head: Normocephalic and atraumatic.  Right Ear: External ear normal.  Left Ear: External ear normal.  Nose: Nose normal.  Mouth/Throat: Oropharynx is clear and moist. No oropharyngeal exudate.  Eyes: Conjunctivae and EOM are normal. Pupils are equal, round, and reactive to light. Right eye exhibits no discharge. Left eye exhibits no discharge. No scleral icterus.  Neck: Normal range of motion. Neck supple. No thyromegaly present.  No bruits or thyromegaly  Cardiovascular: Normal rate, regular rhythm, normal heart sounds and intact distal pulses.   No murmur heard. The heart has a regular rate and rhythm at 72/m  Pulmonary/Chest: Effort normal and breath sounds normal. No respiratory distress. She has no wheezes. She has no rales. She exhibits no tenderness.  Clear anteriorly and posteriorly  Abdominal: Soft. Bowel sounds are normal. She exhibits no mass. There is no tenderness. There is no rebound and no guarding.  There is some slight abdominal tenderness above the irritated area and this is all just above her inguinal hernia repair. On close examination there was a catgut stitch protruding out of a small ulcer on the abdomen. After checking this more closely this area was cleansed and a pair of tweezers was used to remove this. The patient was instructed to use Bactroban ointment was slight cleaning with peroxide a couple times a day for a week and to call us back if the area remains irritated.  Musculoskeletal: Normal range of motion. She exhibits no edema.  Lymphadenopathy:    She has no cervical adenopathy.  Neurological: She is  alert and oriented to person, place, and time. She has normal reflexes. No cranial nerve deficit.  Skin: Skin is warm and dry. No rash noted.  Psychiatric: She has a normal mood and affect. Her behavior is normal. Judgment and thought content normal.  Nursing note and vitals reviewed.  BP (!) 146/80 (BP Location: Left Arm)   Pulse 71   Temp 97.8 F (36.6 C) (Oral)   Ht 5' 3"  (1.6 m)   Wt 135 lb (61.2 kg)   BMI 23.91 kg/m         Assessment & Plan:  1. Essential hypertension -The systolic blood pressure is slightly elevated initially today but on repeat was good and the patient will continue to watch her sodium intake and continue with current medical therapy. - DG Chest 2 View; Future - BMP8+EGFR; Future - CBC  with Differential/Platelet; Future - Hepatic function panel; Future - NMR, lipoprofile; Future  2. Vitamin D deficiency -Continue with vitamin D replacement pending results of lab work - CBC with Differential/Platelet; Future - VITAMIN D 25 Hydroxy (Vit-D Deficiency, Fractures); Future  3. Trouble swallowing -This was improved with having a stretching of her esophagus by the gastroenterologist and she is doing fairly well with this at the present time.  4. Ulcerative colitis with other complication, unspecified location [facility]) -Continue to follow-up with gastroenterology  5. Chronic LBP -Continue to be careful not put herself at risk for falling  6. Scoliosis -The scoliosis and this patient is severe and she must always be careful not to be in a hurry and to avoid having any falls or accidents.  7. Foreign body (FB) in soft tissue -On closer examination of the skin irritation on her abdomen a suture was sticking out of the sore and this was removed after cleansing with Betadine solution and a dressing was applied afterward. She tolerated this well.  Patient Instructions                       Medicare Annual Wellness Visit  [facility] and the medical providers  at [facility] strive to bring you the best medical care.  In doing so we not only want to address your current medical conditions and concerns but also to detect new conditions early and prevent illness, disease and health-related problems.    Medicare offers a yearly Wellness Visit which allows our clinical staff to assess your need for preventative services including immunizations, lifestyle education, counseling to decrease risk of preventable diseases and screening for fall risk and other medical concerns.    This visit is provided free of charge (no copay) for all Medicare recipients. The clinical pharmacists at [facility] have begun to conduct these Wellness Visits which will also include a thorough review of all your medications.    As you primary medical provider recommend that you make an appointment for your Annual Wellness Visit if you have not done so already this year.  You may set up this appointment before you leave today or you may call back ([phone]) and schedule an appointment.  Please make sure when you call that you mention that you are scheduling your Annual Wellness Visit with the clinical pharmacist so that the appointment may be made for the proper length of time.     Continue current medications. Continue good therapeutic lifestyle changes which include good diet and exercise. Fall precautions discussed with patient. If an FOBT was given today- please return it to our front desk. If you are over 61 years old - you may need Prevnar 72 or the adult Pneumonia vaccine.   After your visit with [place] today you will receive a survey in the mail or online from [employer] regarding your care with [place]. Please take a moment to fill this out. Your feedback is very important to [place] as you can help [place] better understand your patient needs as well as improve your experience and satisfaction. WE CARE ABOUT YOU!!!   Continue to follow-up with  gastroenterology Make an appointment to see the orthopedist as planned Apply antibiotic ointment and cleanse wound where the stitch was removed a couple times a day with peroxide If the area continues to bother you please get back in touch with [place] If any redness develops please get back in touch with [place]  Arrie Senate MD

## 2015-12-11 NOTE — Patient Instructions (Addendum)
Medicare Annual Wellness Visit  [facility] and the medical providers at [facility] strive to bring you the best medical care.  In doing so we not only want to address your current medical conditions and concerns but also to detect new conditions early and prevent illness, disease and health-related problems.    Medicare offers a yearly Wellness Visit which allows our clinical staff to assess your need for preventative services including immunizations, lifestyle education, counseling to decrease risk of preventable diseases and screening for fall risk and other medical concerns.    This visit is provided free of charge (no copay) for all Medicare recipients. The clinical pharmacists at [facility] have begun to conduct these Wellness Visits which will also include a thorough review of all your medications.    As you primary medical provider recommend that you make an appointment for your Annual Wellness Visit if you have not done so already this year.  You may set up this appointment before you leave today or you may call back ([phone]) and schedule an appointment.  Please make sure when you call that you mention that you are scheduling your Annual Wellness Visit with the clinical pharmacist so that the appointment may be made for the proper length of time.     Continue current medications. Continue good therapeutic lifestyle changes which include good diet and exercise. Fall precautions discussed with patient. If an FOBT was given today- please return it to our front desk. If you are over 25 years old - you may need Prevnar 30 or the adult Pneumonia vaccine.   After your visit with [place] today you will receive a survey in the mail or online from [employer] regarding your care with [place]. Please take a moment to fill this out. Your feedback is very important to [place] as you can help [place] better understand your patient needs as well as  improve your experience and satisfaction. WE CARE ABOUT YOU!!!   Continue to follow-up with gastroenterology Make an appointment to see the orthopedist as planned Apply antibiotic ointment and cleanse wound where the stitch was removed a couple times a day with peroxide If the area continues to bother you please get back in touch with [place] If any redness develops please get back in touch with [place]

## 2015-12-11 NOTE — Addendum Note (Signed)
Addended by: Zannie Cove on: 12/11/2015 02:04 PM   Modules accepted: Orders

## 2015-12-11 NOTE — Therapy (Signed)
[facility] [place], [place], [address] Phone: [phone]   Fax:  [phone]  Physical Therapy Treatment  Patient Details  Name: Lori Soto MRN: [id number] Date of Birth: 04-26-1936 Referring Provider: Redge Gainer MD  Encounter Date: 12/11/2015      PT End of Session - 12/11/15 1122    Visit Number 5   Number of Visits 24   Date for PT Re-Evaluation 01/23/16   PT Start Time 1122   PT Stop Time 1215   PT Time Calculation (min) 53 min   Activity Tolerance Patient tolerated treatment well   Behavior During Therapy [facility] for tasks assessed/performed      Past Medical History:  Diagnosis Date  . Anemia   . Cancer ([place])    skin cancer on nose  . Carpal tunnel syndrome, bilateral   . Cataract   . Esophagitis   . GERD (gastroesophageal reflux disease)   . H/O hiatal hernia   . Osteopenia   . Scoliosis   . Shingles   . Shingles   . Ulcerative colitis     Past Surgical History:  Procedure Laterality Date  . ABDOMINAL HYSTERECTOMY    . BUNIONECTOMY     Bilateral  . CARPAL TUNNEL RELEASE Left 03/10/2015   Procedure: LEFT CARPAL TUNNEL RELEASE;  Surgeon: Daryll Brod, MD;  Location: [facility];  Service: Orthopedics;  Laterality: Left;  . CATARACT EXTRACTION W/PHACO  05/28/2012   Procedure: CATARACT EXTRACTION PHACO AND INTRAOCULAR LENS PLACEMENT (IOC);  Surgeon: Tonny Branch, MD;  Location[facility];  Service: Ophthalmology;  Laterality: Right;  CDE=12.84  . CATARACT EXTRACTION W/PHACO  06/07/2012   Procedure: CATARACT EXTRACTION PHACO AND INTRAOCULAR LENS PLACEMENT (IOC);  Surgeon: Tonny Branch, MD;  Location: [facility];  Service: Ophthalmology;  Laterality: Left;  CDE 17.60  . COLONOSCOPY    . COLONOSCOPY N/A 07/08/2015   Procedure: COLONOSCOPY;  Surgeon: Rogene Houston, MD;  [facility];  Service: Endoscopy;  Laterality: N/A;  930  . ESOPHAGEAL DILATION N/A 07/08/2015   Procedure: ESOPHAGEAL  DILATION;  Surgeon: Rogene Houston, MD;  [facility];  Service: Endoscopy;  Laterality: N/A;  . ESOPHAGOGASTRODUODENOSCOPY N/A 07/08/2015   Procedure: ESOPHAGOGASTRODUODENOSCOPY (EGD);  Surgeon: Rogene Houston, MD;  [facility];  Service: Endoscopy;  Laterality: N/A;  . Hernia     Right inguinal  . HERNIA REPAIR  1961   right inguinal hernia  . UPPER GASTROINTESTINAL ENDOSCOPY      There were no vitals filed for this visit.      Subjective Assessment - 12/11/15 1121    Subjective Reports she hasn't done much today as she had an MD visit prior to PT. "It's probably the best its been in a while."   Limitations Standing   How long can you stand comfortably? 20 minutes.   Currently in Pain? Yes   Pain Score 5    Pain Location Back   Pain Orientation Lower   Multiple Pain Sites Yes            [facility] PT Assessment - 12/11/15 0001      Assessment   Medical Diagnosis Scoliosis.     Precautions   Precautions None     Restrictions   Weight Bearing Restrictions No                     [facility] Adult PT Treatment/Exercise - 12/11/15 0001  Modalities   Modalities Ultrasound;Traction     Ultrasound   Ultrasound Location R lumbar paraspinals   Ultrasound Parameters 1.5 w/cm2, 100%, 1 mhz x10 min   Ultrasound Goals Pain     Traction   Type of Traction Lumbar   Min (lbs) 5   Max (lbs) 70   Hold Time 99   Rest Time 5   Time 15     Manual Therapy   Manual Therapy Soft tissue mobilization   Soft tissue mobilization STW/ MFR to R lumbar paraspinals/QL to decrease tightness while lying in L sidelying to decrease tightness and pain                  PT Short Term Goals - 11/24/15 1422      PT SHORT TERM GOAL #1   Title Ind with a HEP.   Time 2   Period Weeks   Status New           PT Long Term Goals - 11/24/15 1422      PT LONG TERM GOAL #1   Title Perform ADL's with pain not > 3/10.   Time 8   Period Weeks    Status New     PT LONG TERM GOAL #2   Title Stand 45 minutes with pain not > 3-4/10.   Time 8   Period Weeks   Status New               Plan - 12/11/15 1233    Clinical Impression Statement Patient arrived to treatment with mid level low back pain as she states she has not had the oppurtunity to do much activity today. Patient continues to present with increased tightness in the R QL during manual therapy in L sidelying. Normal modalities response noted following removal of the modalities. Goals remain on-going at this time secondary to pain that patient experiences. Traction was again maintained at 70# with no complaints following treatment.   Rehab Potential Fair   Clinical Impairments Affecting Rehab Potential Ongoing and progressing spinal pain over many years.   PT Frequency 2x / week   PT Duration 8 weeks   PT Treatment/Interventions ADLs/Self Care Home Management;Cryotherapy;Electrical Stimulation;Ultrasound;Traction;Moist Heat;Therapeutic activities;Therapeutic exercise   PT Next Visit Plan Continue with US/STW to low back as well as traction per MPT POC.   Consulted and Agree with Plan of Care Patient      Patient will benefit from skilled therapeutic intervention in order to improve the following deficits and impairments:  Decreased activity tolerance, Decreased range of motion, Pain  Visit Diagnosis: Right-sided low back pain without sciatica  Pain in thoracic spine  Abnormal posture     Problem List Patient Active Problem List   Diagnosis Date Noted  . Osteopenia of the elderly 02/19/2014  . DDD (degenerative disc disease), lumbar 12/03/2013  . Scoliosis 12/03/2013  . Chronic LBP 05/14/2013  . IBS (irritable bowel syndrome) 11/08/2012  . UC (ulcerative colitis) ([place]) 04/23/2012  . Palpitation 10/26/2011  . Hypertension 10/26/2011  . Chronic ulcerative colitis ([place]) 03/23/2011    Wynelle Fanny, PTA 12/11/2015, 12:42 PM  [facility] [place], [place], [address] Phone: [phone]   Fax:  [phone]  Name: Shakirra Buehler MRN: [id number] Date of Birth: August 25, 1935

## 2015-12-14 DIAGNOSIS — M5137 Other intervertebral disc degeneration, lumbosacral region: Secondary | ICD-10-CM | POA: Diagnosis not present

## 2015-12-14 DIAGNOSIS — M9905 Segmental and somatic dysfunction of pelvic region: Secondary | ICD-10-CM | POA: Diagnosis not present

## 2015-12-16 DIAGNOSIS — M9905 Segmental and somatic dysfunction of pelvic region: Secondary | ICD-10-CM | POA: Diagnosis not present

## 2015-12-16 DIAGNOSIS — M5137 Other intervertebral disc degeneration, lumbosacral region: Secondary | ICD-10-CM | POA: Diagnosis not present

## 2015-12-17 DIAGNOSIS — M5137 Other intervertebral disc degeneration, lumbosacral region: Secondary | ICD-10-CM | POA: Diagnosis not present

## 2015-12-17 DIAGNOSIS — M9905 Segmental and somatic dysfunction of pelvic region: Secondary | ICD-10-CM | POA: Diagnosis not present

## 2015-12-18 ENCOUNTER — Other Ambulatory Visit: Payer: Medicare Other

## 2015-12-18 ENCOUNTER — Ambulatory Visit: Payer: Medicare Other | Admitting: Physical Therapy

## 2015-12-18 DIAGNOSIS — R293 Abnormal posture: Secondary | ICD-10-CM | POA: Diagnosis not present

## 2015-12-18 DIAGNOSIS — E559 Vitamin D deficiency, unspecified: Secondary | ICD-10-CM | POA: Diagnosis not present

## 2015-12-18 DIAGNOSIS — M546 Pain in thoracic spine: Secondary | ICD-10-CM

## 2015-12-18 DIAGNOSIS — M545 Low back pain, unspecified: Secondary | ICD-10-CM

## 2015-12-18 DIAGNOSIS — I10 Essential (primary) hypertension: Secondary | ICD-10-CM

## 2015-12-18 NOTE — Therapy (Signed)
[facility] [place], [place], [address] Phone: [phone]   Fax:  [phone]  Physical Therapy Treatment  Patient Details  Name: Lori Soto MRN: [id number] Date of Birth: 05/15/1935 Referring Provider: Redge Gainer MD  Encounter Date: 12/18/2015      PT End of Session - 12/18/15 1309    Visit Number 6   Number of Visits 24   Date for PT Re-Evaluation 01/23/16   PT Start Time 0946      Past Medical History:  Diagnosis Date  . Anemia   . Cancer ([place])    skin cancer on nose  . Carpal tunnel syndrome, bilateral   . Cataract   . Esophagitis   . GERD (gastroesophageal reflux disease)   . H/O hiatal hernia   . Osteopenia   . Scoliosis   . Shingles   . Shingles   . Ulcerative colitis     Past Surgical History:  Procedure Laterality Date  . ABDOMINAL HYSTERECTOMY    . BUNIONECTOMY     Bilateral  . CARPAL TUNNEL RELEASE Left 03/10/2015   Procedure: LEFT CARPAL TUNNEL RELEASE;  Surgeon: Daryll Brod, MD;  Location: [facility];  Service: Orthopedics;  Laterality: Left;  . CATARACT EXTRACTION W/PHACO  05/28/2012   Procedure: CATARACT EXTRACTION PHACO AND INTRAOCULAR LENS PLACEMENT (IOC);  Surgeon: Tonny Branch, MD;  [facility];  Service: Ophthalmology;  Laterality: Right;  CDE=12.84  . CATARACT EXTRACTION W/PHACO  06/07/2012   Procedure: CATARACT EXTRACTION PHACO AND INTRAOCULAR LENS PLACEMENT (IOC);  Surgeon: Tonny Branch, MD;  [facility];  Service: Ophthalmology;  Laterality: Left;  CDE 17.60  . COLONOSCOPY    . COLONOSCOPY N/A 07/08/2015   Procedure: COLONOSCOPY;  Surgeon: Rogene Houston, MD;  [facility];  Service: Endoscopy;  Laterality: N/A;  930  . ESOPHAGEAL DILATION N/A 07/08/2015   Procedure: ESOPHAGEAL DILATION;  Surgeon: Rogene Houston, MD;  [facility];  Service: Endoscopy;  Laterality: N/A;  . ESOPHAGOGASTRODUODENOSCOPY N/A 07/08/2015   Procedure:  ESOPHAGOGASTRODUODENOSCOPY (EGD);  Surgeon: Rogene Houston, MD;  [facility];  Service: Endoscopy;  Laterality: N/A;  . Hernia     Right inguinal  . HERNIA REPAIR  1961   right inguinal hernia  . UPPER GASTROINTESTINAL ENDOSCOPY      There were no vitals filed for this visit.      Subjective Assessment - 12/18/15 1309    Subjective I'm doing very well.   Pain Score 3    Pain Location Back   Pain Orientation Lower   Pain Descriptors / Indicators Aching;Constant   Pain Type Chronic pain   Pain Onset More than a month ago                         [facility] Adult PT Treatment/Exercise - 12/18/15 0001      Traction   Type of Traction Lumbar   Min (lbs) 5   Max (lbs) 75   Hold Time 99   Rest Time 5   Time 15     Manual Therapy   Soft tissue mobilization In prone STW/M x 23 minutes to affected thoroco-lumbar musculature.                  PT Short Term Goals - 11/24/15 1422      PT SHORT TERM GOAL #1   Title Ind with a HEP.   Time 2  Period Weeks   Status New           PT Long Term Goals - 11/24/15 1422      PT LONG TERM GOAL #1   Title Perform ADL's with pain not > 3/10.   Time 8   Period Weeks   Status New     PT LONG TERM GOAL #2   Title Stand 45 minutes with pain not > 3-4/10.   Time 8   Period Weeks   Status New               Plan - 12/18/15 1311    Clinical Impression Statement Patient doing very well with a lowered pain-level.  She is responding very well to traction and STW/M.  Increased traction to 75# with excellent response.   Rehab Potential Good   Clinical Impairments Affecting Rehab Potential Ongoing and progressing spinal pain over many years.   PT Frequency 2x / week   PT Duration 8 weeks   PT Treatment/Interventions ADLs/Self Care Home Management;Cryotherapy;Electrical Stimulation;Ultrasound;Traction;Moist Heat;Therapeutic activities;Therapeutic exercise   PT Next Visit Plan Continue with  US/STW to low back as well as traction per MPT POC.   Consulted and Agree with Plan of Care Patient      Patient will benefit from skilled therapeutic intervention in order to improve the following deficits and impairments:  Decreased activity tolerance, Decreased range of motion, Pain  Visit Diagnosis: Right-sided low back pain without sciatica  Pain in thoracic spine  Abnormal posture     Problem List Patient Active Problem List   Diagnosis Date Noted  . Osteopenia of the elderly 02/19/2014  . DDD (degenerative disc disease), lumbar 12/03/2013  . Scoliosis 12/03/2013  . Chronic LBP 05/14/2013  . IBS (irritable bowel syndrome) 11/08/2012  . UC (ulcerative colitis) ([place]) 04/23/2012  . Palpitation 10/26/2011  . Hypertension 10/26/2011  . Chronic ulcerative colitis ([place]) 03/23/2011    [place], [place]  MPT 12/18/2015, 1:14 PM  [facility] [address] [place], [place], [address] Phone: [phone]   Fax:  [phone]  Name: Lori Soto MRN: [id number] Date of Birth: 1935-11-22

## 2015-12-19 LAB — HEPATIC FUNCTION PANEL
ALT: 12 IU/L (ref 0–32)
AST: 20 IU/L (ref 0–40)
Albumin: 4.3 g/dL (ref 3.5–4.8)
Alkaline Phosphatase: 38 IU/L — ABNORMAL LOW (ref 39–117)
Bilirubin Total: 0.6 mg/dL (ref 0.0–1.2)
Bilirubin, Direct: 0.13 mg/dL (ref 0.00–0.40)
TOTAL PROTEIN: 7.2 g/dL (ref 6.0–8.5)

## 2015-12-19 LAB — NMR, LIPOPROFILE
Cholesterol: 205 mg/dL — ABNORMAL HIGH (ref 100–199)
HDL Cholesterol by NMR: 65 mg/dL (ref >39)
HDL Particle Number: 36.4 umol/L (ref >=30.5)
LDL Particle Number: 1534 nmol/L — ABNORMAL HIGH (ref <1000)
LDL Size: 21.7 nm (ref >20.5)
LDL-C: 124 mg/dL — ABNORMAL HIGH (ref 0–99)
LP-IR Score: <25 (ref <=45)
Small LDL Particle Number: 307 nmol/L (ref <=527)
Triglycerides by NMR: 79 mg/dL (ref 0–149)

## 2015-12-19 LAB — CBC WITH DIFFERENTIAL/PLATELET
Basophils Absolute: 0 10*3/uL (ref 0.0–0.2)
Basos: 0 %
EOS (ABSOLUTE): 0.1 10*3/uL (ref 0.0–0.4)
Eos: 1 %
Hematocrit: 42.3 % (ref 34.0–46.6)
Hemoglobin: 14.3 g/dL (ref 11.1–15.9)
Immature Grans (Abs): 0 10*3/uL (ref 0.0–0.1)
Immature Granulocytes: 0 %
Lymphocytes Absolute: 1.6 10*3/uL (ref 0.7–3.1)
Lymphs: 30 %
MCH: 29.7 pg (ref 26.6–33.0)
MCHC: 33.8 g/dL (ref 31.5–35.7)
MCV: 88 fL (ref 79–97)
Monocytes Absolute: 0.5 10*3/uL (ref 0.1–0.9)
Monocytes: 9 %
Neutrophils Absolute: 3.1 10*3/uL (ref 1.4–7.0)
Neutrophils: 60 %
Platelets: 286 10*3/uL (ref 150–379)
RBC: 4.81 x10E6/uL (ref 3.77–5.28)
RDW: 13.5 % (ref 12.3–15.4)
WBC: 5.2 10*3/uL (ref 3.4–10.8)

## 2015-12-19 LAB — VITAMIN D 25 HYDROXY (VIT D DEFICIENCY, FRACTURES): Vit D, 25-Hydroxy: 43.7 ng/mL (ref 30.0–100.0)

## 2015-12-19 LAB — BMP8+EGFR
BUN/Creatinine Ratio: 16 (ref 12–28)
BUN: 10 mg/dL (ref 8–27)
CALCIUM: 9.7 mg/dL (ref 8.7–10.3)
CHLORIDE: 100 mmol/L (ref 96–106)
CO2: 28 mmol/L (ref 18–29)
Creatinine, Ser: 0.64 mg/dL (ref 0.57–1.00)
GFR calc Af Amer: 99 mL/min/{1.73_m2} (ref >59)
GFR calc non Af Amer: 86 mL/min/{1.73_m2} (ref >59)
Glucose: 87 mg/dL (ref 65–99)
POTASSIUM: 4.7 mmol/L (ref 3.5–5.2)
Sodium: 141 mmol/L (ref 134–144)

## 2015-12-21 DIAGNOSIS — M9905 Segmental and somatic dysfunction of pelvic region: Secondary | ICD-10-CM | POA: Diagnosis not present

## 2015-12-21 DIAGNOSIS — M5137 Other intervertebral disc degeneration, lumbosacral region: Secondary | ICD-10-CM | POA: Diagnosis not present

## 2015-12-23 ENCOUNTER — Encounter: Payer: Self-pay | Admitting: Physical Therapy

## 2015-12-23 ENCOUNTER — Ambulatory Visit: Payer: Medicare Other | Admitting: Physical Therapy

## 2015-12-23 DIAGNOSIS — M546 Pain in thoracic spine: Secondary | ICD-10-CM | POA: Diagnosis not present

## 2015-12-23 DIAGNOSIS — M545 Low back pain, unspecified: Secondary | ICD-10-CM

## 2015-12-23 DIAGNOSIS — R293 Abnormal posture: Secondary | ICD-10-CM

## 2015-12-23 DIAGNOSIS — M9905 Segmental and somatic dysfunction of pelvic region: Secondary | ICD-10-CM | POA: Diagnosis not present

## 2015-12-23 DIAGNOSIS — M5137 Other intervertebral disc degeneration, lumbosacral region: Secondary | ICD-10-CM | POA: Diagnosis not present

## 2015-12-23 NOTE — Therapy (Signed)
[facility] [place], [place], [address] Phone: [phone]   Fax:  [phone]  Physical Therapy Treatment  Patient Details  Name: Lori Soto MRN: [id number] Date of Birth: 1936/02/22 Referring Provider: Redge Gainer MD  Encounter Date: 12/23/2015      PT End of Session - 12/23/15 1210    Visit Number 7   Number of Visits 24   Date for PT Re-Evaluation 01/23/16   PT Start Time 2197   PT Stop Time 1244   PT Time Calculation (min) 43 min   Activity Tolerance Patient tolerated treatment well   Behavior During Therapy [facility] for tasks assessed/performed      Past Medical History:  Diagnosis Date  . Anemia   . Cancer ([place])    skin cancer on nose  . Carpal tunnel syndrome, bilateral   . Cataract   . Esophagitis   . GERD (gastroesophageal reflux disease)   . H/O hiatal hernia   . Osteopenia   . Scoliosis   . Shingles   . Shingles   . Ulcerative colitis     Past Surgical History:  Procedure Laterality Date  . ABDOMINAL HYSTERECTOMY    . BUNIONECTOMY     Bilateral  . CARPAL TUNNEL RELEASE Left 03/10/2015   Procedure: LEFT CARPAL TUNNEL RELEASE;  Surgeon: Daryll Brod, MD;  Location: [place];  Service: Orthopedics;  Laterality: Left;  . CATARACT EXTRACTION W/PHACO  05/28/2012   Procedure: CATARACT EXTRACTION PHACO AND INTRAOCULAR LENS PLACEMENT (IOC);  Surgeon: Tonny Branch, MD;  [facility];  Service: Ophthalmology;  Laterality: Right;  CDE=12.84  . CATARACT EXTRACTION W/PHACO  06/07/2012   Procedure: CATARACT EXTRACTION PHACO AND INTRAOCULAR LENS PLACEMENT (IOC);  Surgeon: Tonny Branch, MD;  [facility];  Service: Ophthalmology;  Laterality: Left;  CDE 17.60  . COLONOSCOPY    . COLONOSCOPY N/A 07/08/2015   Procedure: COLONOSCOPY;  Surgeon: Rogene Houston, MD;  [facility];  Service: Endoscopy;  Laterality: N/A;  930  . ESOPHAGEAL DILATION N/A 07/08/2015   Procedure: ESOPHAGEAL  DILATION;  Surgeon: Rogene Houston, MD;  [facility];  Service: Endoscopy;  Laterality: N/A;  . ESOPHAGOGASTRODUODENOSCOPY N/A 07/08/2015   Procedure: ESOPHAGOGASTRODUODENOSCOPY (EGD);  Surgeon: Rogene Houston, MD;  [facility];  Service: Endoscopy;  Laterality: N/A;  . Hernia     Right inguinal  . HERNIA REPAIR  1961   right inguinal hernia  . UPPER GASTROINTESTINAL ENDOSCOPY      There were no vitals filed for this visit.      Subjective Assessment - 12/23/15 1203    Subjective Patient feels like theraphy is helping   Limitations Standing   How long can you stand comfortably? 20 minutes.   Currently in Pain? Yes   Pain Score 6    Pain Location Back   Pain Orientation Lower   Pain Descriptors / Indicators Aching   Pain Type Chronic pain   Pain Onset More than a month ago   Pain Frequency Constant   Aggravating Factors  standing   Pain Relieving Factors traction                         [facility] Adult PT Treatment/Exercise - 12/23/15 0001      Ultrasound   Ultrasound Location right lumbar paraspinals   Ultrasound Parameters 1.5w/cm2/50%/58mz x145m     Traction   Type of Traction Lumbar  Min (lbs) 5   Max (lbs) 75   Hold Time 99   Rest Time 5   Time 15     Manual Therapy   Manual Therapy Soft tissue mobilization   Soft tissue mobilization manual and IASTM to right low back paraspinals                PT Education - 12/23/15 1217    Education provided Yes   Education Details HEP   Person(s) Educated Patient   Methods Explanation;Demonstration;Handout   Comprehension Verbalized understanding;Returned demonstration          PT Short Term Goals - 12/23/15 1222      PT SHORT TERM GOAL #1   Title Ind with a HEP.   Period Weeks   Status Achieved  12/23/15           PT Long Term Goals - 11/24/15 1422      PT LONG TERM GOAL #1   Title Perform ADL's with pain not > 3/10.   Time 8   Period Weeks   Status  New     PT LONG TERM GOAL #2   Title Stand 45 minutes with pain not > 3-4/10.   Time 8   Period Weeks   Status New               Plan - 12/23/15 1217    Clinical Impression Statement Patient progressing with reports of relief after treatments thus far. Patient has difficulty with yard work and ADL's due to pain level. Patient was educated on posture awarenes techniques with all positions with handout. Patint is independent with handout. Patient met STG#1 and LTG's ongoing due to pain deficits.   Rehab Potential Good   Clinical Impairments Affecting Rehab Potential Ongoing and progressing spinal pain over many years.   PT Frequency 2x / week   PT Duration 8 weeks   PT Treatment/Interventions ADLs/Self Care Home Management;Cryotherapy;Electrical Stimulation;Ultrasound;Traction;Moist Heat;Therapeutic activities;Therapeutic exercise   PT Next Visit Plan Continue with US/STW to low back as well as traction per MPT POC.   Consulted and Agree with Plan of Care Patient      Patient will benefit from skilled therapeutic intervention in order to improve the following deficits and impairments:  Decreased activity tolerance, Decreased range of motion, Pain  Visit Diagnosis: Right-sided low back pain without sciatica  Pain in thoracic spine  Abnormal posture     Problem List Patient Active Problem List   Diagnosis Date Noted  . Osteopenia of the elderly 02/19/2014  . DDD (degenerative disc disease), lumbar 12/03/2013  . Scoliosis 12/03/2013  . Chronic LBP 05/14/2013  . IBS (irritable bowel syndrome) 11/08/2012  . UC (ulcerative colitis) ([place]) 04/23/2012  . Palpitation 10/26/2011  . Hypertension 10/26/2011  . Chronic ulcerative colitis ([place]) 03/23/2011    Lori Soto, PTA 12/23/2015, 12:49 PM  [facility] [place], [place], [address] Phone: [phone]   Fax:  [phone]  Name: Lori Soto MRN:  [id number] Date of Birth: [birth year]

## 2015-12-23 NOTE — Patient Instructions (Signed)
Pelvic Tilt: Posterior - Legs Bent (Supine)   Tighten stomach and flatten back by rolling pelvis down. Hold _10___ seconds. Relax. Repeat _10-30___ times per set. Do __2__ sets per session. Do _2___ sessions per day.   Brushing Teeth    Place one foot on ledge and one hand on counter. Bend other knee slightly to keep back straight.  Copyright  VHI. All rights reserved.  Refrigerator   Squat with knees apart to reach lower shelves and drawers.   Copyright  VHI. All rights reserved.  Laundry [employer] down and hold basket close to stand. Use leg muscles to do the work.   Copyright  VHI. All rights reserved.  Housework - Vacuuming   Hold the vacuum with arm held at side. Step back and forth to move it, keeping head up. Avoid twisting.   Copyright  VHI. All rights reserved.  Housework - Wiping   Position yourself as close as possible to reach work surface. Avoid straining your back.     Sleeping on Side   Place pillow between knees. Use cervical support under neck and a roll around waist as needed.   Copyright  VHI. All rights reserved.  Log Roll   Lying on back, bend left knee and place left arm across chest. Roll all in one movement to the right. Reverse to roll to the left. Always move as one unit.   Copyright  VHI. All rights reserved.  Stand to Sit / Sit to Stand   To sit: Bend knees to lower self onto front edge of chair, then scoot back on seat. To stand: Reverse sequence by placing one foot forward, and scoot to front of seat. Use rocking motion to stand up.  Copyright  VHI. All rights reserved.  Posture - Standing   Good posture is important. Avoid slouching and forward head thrust. Maintain curve in low back and align ears over shoul- ders, hips over ankles.   Copyright  VHI. All rights reserved.  Posture - Sitting   Sit upright, head facing forward. Try using a roll to support lower back. Keep shoulders relaxed, and avoid  rounded back. Keep hips level with knees. Avoid crossing legs for long periods.

## 2015-12-24 DIAGNOSIS — M5137 Other intervertebral disc degeneration, lumbosacral region: Secondary | ICD-10-CM | POA: Diagnosis not present

## 2015-12-24 DIAGNOSIS — M9905 Segmental and somatic dysfunction of pelvic region: Secondary | ICD-10-CM | POA: Diagnosis not present

## 2015-12-28 ENCOUNTER — Encounter (INDEPENDENT_AMBULATORY_CARE_PROVIDER_SITE_OTHER): Payer: Self-pay | Admitting: Internal Medicine

## 2015-12-28 DIAGNOSIS — M9905 Segmental and somatic dysfunction of pelvic region: Secondary | ICD-10-CM | POA: Diagnosis not present

## 2015-12-28 DIAGNOSIS — M5137 Other intervertebral disc degeneration, lumbosacral region: Secondary | ICD-10-CM | POA: Diagnosis not present

## 2015-12-29 ENCOUNTER — Ambulatory Visit: Payer: Medicare Other | Admitting: Physical Therapy

## 2015-12-29 DIAGNOSIS — R293 Abnormal posture: Secondary | ICD-10-CM | POA: Diagnosis not present

## 2015-12-29 DIAGNOSIS — M545 Low back pain, unspecified: Secondary | ICD-10-CM

## 2015-12-29 DIAGNOSIS — M546 Pain in thoracic spine: Secondary | ICD-10-CM | POA: Diagnosis not present

## 2015-12-29 NOTE — Therapy (Signed)
[facility] [place], [place], [address] Phone: [phone]   Fax:  [phone]  Physical Therapy Treatment  Patient Details  Name: Lori Soto MRN: [id number] Date of Birth: October 17, 1935 Referring Provider: Redge Gainer MD  Encounter Date: 12/29/2015      PT End of Session - 12/29/15 1436    Visit Number 8   Number of Visits 24   Date for PT Re-Evaluation 01/23/16   PT Start Time 3382   PT Stop Time 1526   PT Time Calculation (min) 50 min   Activity Tolerance Patient tolerated treatment well   Behavior During Therapy [facility] for tasks assessed/performed      Past Medical History:  Diagnosis Date  . Anemia   . Cancer ([place])    skin cancer on nose  . Carpal tunnel syndrome, bilateral   . Cataract   . Esophagitis   . GERD (gastroesophageal reflux disease)   . H/O hiatal hernia   . Osteopenia   . Scoliosis   . Shingles   . Shingles   . Ulcerative colitis     Past Surgical History:  Procedure Laterality Date  . ABDOMINAL HYSTERECTOMY    . BUNIONECTOMY     Bilateral  . CARPAL TUNNEL RELEASE Left 03/10/2015   Procedure: LEFT CARPAL TUNNEL RELEASE;  Surgeon: Daryll Brod, MD;  Location: [place];  Service: Orthopedics;  Laterality: Left;  . CATARACT EXTRACTION W/PHACO  05/28/2012   Procedure: CATARACT EXTRACTION PHACO AND INTRAOCULAR LENS PLACEMENT (IOC);  Surgeon: Tonny Branch, MD;  [facility];  Service: Ophthalmology;  Laterality: Right;  CDE=12.84  . CATARACT EXTRACTION W/PHACO  06/07/2012   Procedure: CATARACT EXTRACTION PHACO AND INTRAOCULAR LENS PLACEMENT (IOC);  Surgeon: Tonny Branch, MD;  [facility];  Service: Ophthalmology;  Laterality: Left;  CDE 17.60  . COLONOSCOPY    . COLONOSCOPY N/A 07/08/2015   Procedure: COLONOSCOPY;  Surgeon: Rogene Houston, MD;  [facility];  Service: Endoscopy;  Laterality: N/A;  930  . ESOPHAGEAL DILATION N/A 07/08/2015   Procedure: ESOPHAGEAL  DILATION;  Surgeon: Rogene Houston, MD;  [facility];  Service: Endoscopy;  Laterality: N/A;  . ESOPHAGOGASTRODUODENOSCOPY N/A 07/08/2015   Procedure: ESOPHAGOGASTRODUODENOSCOPY (EGD);  Surgeon: Rogene Houston, MD;  [facility];  Service: Endoscopy;  Laterality: N/A;  . Hernia     Right inguinal  . HERNIA REPAIR  1961   right inguinal hernia  . UPPER GASTROINTESTINAL ENDOSCOPY      There were no vitals filed for this visit.      Subjective Assessment - 12/29/15 1436    Subjective " I can tell some difference." Reports that over the weekend she was 10/10 pain as she was preparing and hosting a house warming party.   Limitations Standing   How long can you stand comfortably? 20 minutes.   Currently in Pain? Yes   Pain Score 7    Pain Location Back   Pain Orientation Lower   Pain Type Chronic pain   Pain Onset More than a month ago                         [facility] Adult PT Treatment/Exercise - 12/29/15 0001      Ultrasound   Ultrasound Location R lumbar paraspinals    Ultrasound Parameters 1.5 w/cm2, 100%, 66mz x10 min   Ultrasound Goals Pain     Traction   Type  of Traction Lumbar   Min (lbs) 5   Max (lbs) 75   Hold Time 99   Rest Time 5   Time 15     Manual Therapy   Manual Therapy Soft tissue mobilization   Soft tissue mobilization STW to R lumbar paraspinals/ QL and L thoracic paraspinals to decrease pain and tightness                  PT Short Term Goals - 12/23/15 1222      PT SHORT TERM GOAL #1   Title Ind with a HEP.   Period Weeks   Status Achieved  12/23/15           PT Long Term Goals - 11/24/15 1422      PT LONG TERM GOAL #1   Title Perform ADL's with pain not > 3/10.   Time 8   Period Weeks   Status New     PT LONG TERM GOAL #2   Title Stand 45 minutes with pain not > 3-4/10.   Time 8   Period Weeks   Status New               Plan - 12/29/15 1531    Clinical Impression Statement  Patient continues to experience increased low back pain with prolonged activity and arrived to treatment today with 7/10 pain. Patient compliant with HEP given during previous treatment per patient report. Minimal increased tightness noted in R lumbar paraspinals and QL and very minimal increased tightness noted in L thoracic paraspinals. Normal modalities response noted following end of modalities and traction at 75#. Goals remain on-going secondary to pain experienced by patient.   Rehab Potential Good   Clinical Impairments Affecting Rehab Potential Ongoing and progressing spinal pain over many years.   PT Frequency 2x / week   PT Duration 8 weeks   PT Treatment/Interventions ADLs/Self Care Home Management;Cryotherapy;Electrical Stimulation;Ultrasound;Traction;Moist Heat;Therapeutic activities;Therapeutic exercise   PT Next Visit Plan Continue with US/STW to low back as well as traction per MPT POC.   Consulted and Agree with Plan of Care Patient      Patient will benefit from skilled therapeutic intervention in order to improve the following deficits and impairments:  Decreased activity tolerance, Decreased range of motion, Pain  Visit Diagnosis: Right-sided low back pain without sciatica  Pain in thoracic spine     Problem List Patient Active Problem List   Diagnosis Date Noted  . Osteopenia of the elderly 02/19/2014  . DDD (degenerative disc disease), lumbar 12/03/2013  . Scoliosis 12/03/2013  . Chronic LBP 05/14/2013  . IBS (irritable bowel syndrome) 11/08/2012  . UC (ulcerative colitis) ([place]) 04/23/2012  . Palpitation 10/26/2011  . Hypertension 10/26/2011  . Chronic ulcerative colitis ([place]) 03/23/2011    Wynelle Fanny, PTA 12/29/2015, 3:39 PM  [facility] [address] [place], [place], [address] Phone: [phone]   Fax:  [phone]  Name: Lori Soto MRN: [id number] Date of Birth: 09-21-1935

## 2015-12-31 DIAGNOSIS — M1711 Unilateral primary osteoarthritis, right knee: Secondary | ICD-10-CM | POA: Diagnosis not present

## 2015-12-31 DIAGNOSIS — M1712 Unilateral primary osteoarthritis, left knee: Secondary | ICD-10-CM | POA: Diagnosis not present

## 2015-12-31 DIAGNOSIS — M170 Bilateral primary osteoarthritis of knee: Secondary | ICD-10-CM | POA: Diagnosis not present

## 2016-01-01 ENCOUNTER — Encounter: Payer: Medicare Other | Admitting: *Deleted

## 2016-01-05 ENCOUNTER — Ambulatory Visit (INDEPENDENT_AMBULATORY_CARE_PROVIDER_SITE_OTHER): Payer: Medicare Other | Admitting: Internal Medicine

## 2016-01-05 ENCOUNTER — Encounter (INDEPENDENT_AMBULATORY_CARE_PROVIDER_SITE_OTHER): Payer: Self-pay | Admitting: Internal Medicine

## 2016-01-05 VITALS — BP 132/68 | Temp 97.7°F | Ht 62.0 in | Wt 134.8 lb

## 2016-01-05 DIAGNOSIS — K2100 Gastro-esophageal reflux disease with esophagitis, without bleeding: Secondary | ICD-10-CM

## 2016-01-05 DIAGNOSIS — K5190 Ulcerative colitis, unspecified, without complications: Secondary | ICD-10-CM | POA: Diagnosis not present

## 2016-01-05 NOTE — Patient Instructions (Signed)
Call if you experience swallowing difficulty or diarrhea relapses.

## 2016-01-05 NOTE — Progress Notes (Signed)
Presenting complaint;  Follow-up for complicated GERD and ulcerative colitis.  Database and Subjective:  Patient is 80 year old Caucasian female was chronic ulcerative colitis and was last seen in January 2017 when she was complaining of solid food dysphagia. She underwent EGD with dilation and surveillance colonoscopy 6 months ago. She had distal esophageal stricture changes of esophagitis and a moderate-sized sliding hiatal hernia. Esophagus was dilated and she's been maintained on PPI. Colonoscopy revealed UC to be in remission. She has sigmoid diverticulosis and she had 2 polyps removed. One was leiomyoma and the other polyp was tubular adenoma.  Issues here for scheduled visit. She states her swallowing has improved a great deal. She is taking pantoprazole about 4 times a week. She is also cutting back on her mesalamine and takes 3 tablets per day. She is having 1-2 formed stools per day. She denies melena or rectal bleeding. She feels mesalamine resulted in constipation and she is better with dose reduction. She is not having any side effects with pantoprazole.    Current Medications: Outpatient Encounter Prescriptions as of 01/05/2016  Medication Sig  . Bromelains 500 MG TABS Take 500 mg by mouth daily.  . Calcium Carbonate-Vitamin D (CALCIUM 600+D) 600-400 MG-UNIT per tablet Take 1 tablet by mouth every evening.  [name] 500 MG CAPS Take 1 capsule by mouth daily.   . Coenzyme Q10 (CO Q-10 PO) Take 1 tablet by mouth 2 (two) times daily. With red yeast rice [employer]  . COLLAGEN PO Take 1 tablet by mouth daily. This is known as 1-2-3  . Crisaborole (EUCRISA) 2 % OINT Apply 1 drop topically daily.  . Cyanocobalamin (VITAMIN B 12 PO) Take 1,000 mcg by mouth daily.   [name] estradiol (ESTRACE) 1 MG tablet Take 1 mg by mouth every other day.   . Ginkgo Biloba 40 MG TABS Take 40 mg by mouth daily.   [name] glucosamine-chondroitin 500-400 MG tablet Take 1 tablet by mouth 2 (two) times  daily.    [name] Hyaluronic Acid-Vitamin C (HYALURONIC ACID PO) Take 80 mg by mouth daily.  . Mesalamine (ASACOL HD) 800 MG TBEC Take 1-2 tablets by mouth 2 (two) times daily. 2 tablets in the morning and 1 tablet in the evening.  [name] MILK THISTLE PO Take 100 mg by mouth daily.  . Misc Natural Products (BUTCHERS BROOM PO) Take 1 tablet by mouth daily.   . MULTIPLE VITAMINS PO Take 1 tablet by mouth daily. In the mornings  . mupirocin ointment (BACTROBAN) 2 % Place 1 application into the nose 2 (two) times daily.  . Nutritional Supplements (GRAPESEED EXTRACT PO) Take 60 mg by mouth daily.   [name] OAT BRAN SOLUBLE PO Take 1 tablet by mouth daily.  [name] OVER THE COUNTER MEDICATION Take 66 mg by mouth daily. Vision Gold Lutein  . OVER THE COUNTER MEDICATION Take 250 mg by mouth daily. Alphalipoic Acid 250 mg daily  . pantoprazole (PROTONIX) 40 MG tablet Take 1 tablet (40 mg total) by mouth daily before breakfast.  . POTASSIUM GLUCONATE PO Take 99 mg by mouth daily. This is Chelated Potassium -[employer]  . pyridOXINE (VITAMIN B-6) 100 MG tablet Take 100 mg by mouth daily.   . Turmeric 500 MG CAPS Take 1 capsule by mouth 3 (three) times daily.   . Vitamins C E (CRANBERRY CONCENTRATE PO) Take 2,500 mg by mouth daily.   . Zinc 50 MG CAPS Take 50 mg by mouth daily.    No facility-administered encounter medications  on file as of 01/05/2016.      Objective: Blood pressure 132/68, temperature 97.7 F (36.5 C), temperature source Oral, height 5' 2"  (1.575 m), weight 134 lb 12.8 oz (61.1 kg). Patient is alert and in no acute distress. Conjunctiva is pink. Sclera is nonicteric Oropharyngeal mucosa is normal. No neck masses or thyromegaly noted. Cardiac exam with regular rhythm normal S1 and S2. No murmur or gallop noted. Lungs are clear to auscultation. Abdomen is symmetrical soft and nontender without organomegaly or masses. No LE edema or clubbing noted.  Labs/studies Results: Lab data from  12/18/2015 WBC 5.2, H&H 14.3 and 42.3 and platelet count 286K. Glucose 87, BUN 10, creatinine 0.64 Serum calcium 9.7. Bilirubin 0.6, AP 38, AST 20, ALT 12, total protein 7.2 and albumin 4.3.  Assessment:  #1. Chronic ulcerative colitis. She remains in remission which was also documented on colonoscopy 6 months ago. She could wait another 4-1/2 years before next colonoscopy unless she is having symptoms. #2. Chronic GERD complicated by distal esophageal stricture which was dilated about 6 months ago. She is doing well with therapy. She can try pantoprazole every other day. However will not change her prescription for now.    Plan:  Patient will call if she develops dysphagia or diarrhea. Decrease Asacol HD to 800 mg by mouth twice a day. Office visit in 6 months.

## 2016-01-12 ENCOUNTER — Ambulatory Visit: Payer: Medicare Other | Attending: Family Medicine | Admitting: *Deleted

## 2016-01-12 DIAGNOSIS — M545 Low back pain, unspecified: Secondary | ICD-10-CM

## 2016-01-12 DIAGNOSIS — M546 Pain in thoracic spine: Secondary | ICD-10-CM | POA: Diagnosis not present

## 2016-01-12 DIAGNOSIS — R293 Abnormal posture: Secondary | ICD-10-CM

## 2016-01-12 NOTE — Therapy (Signed)
[facility] [place], [place], [address] Phone: [phone]   Fax:  [phone]  Physical Therapy Treatment  Patient Details  Name: Lori Soto MRN: [id number] Date of Birth: May 12, 1935 Referring Provider: Redge Gainer MD  Encounter Date: 01/12/2016      PT End of Session - 01/12/16 1341    Visit Number 9   Number of Visits 24   Date for PT Re-Evaluation 01/23/16   PT Start Time 1300   PT Stop Time 2505   PT Time Calculation (min) 49 min      Past Medical History:  Diagnosis Date  . Anemia   . Cancer ([place])    skin cancer on nose  . Carpal tunnel syndrome, bilateral   . Cataract   . Esophagitis   . GERD (gastroesophageal reflux disease)   . H/O hiatal hernia   . Osteopenia   . Scoliosis   . Shingles   . Shingles   . Ulcerative colitis     Past Surgical History:  Procedure Laterality Date  . ABDOMINAL HYSTERECTOMY    . BUNIONECTOMY     Bilateral  . CARPAL TUNNEL RELEASE Left 03/10/2015   Procedure: LEFT CARPAL TUNNEL RELEASE;  Surgeon: Daryll Brod, MD;  Location: [place];  Service: Orthopedics;  Laterality: Left;  . CATARACT EXTRACTION W/PHACO  05/28/2012   Procedure: CATARACT EXTRACTION PHACO AND INTRAOCULAR LENS PLACEMENT (IOC);  Surgeon: Tonny Branch, MD;  [facility];  Service: Ophthalmology;  Laterality: Right;  CDE=12.84  . CATARACT EXTRACTION W/PHACO  06/07/2012   Procedure: CATARACT EXTRACTION PHACO AND INTRAOCULAR LENS PLACEMENT (IOC);  Surgeon: Tonny Branch, MD;  Location: [facility];  Service: Ophthalmology;  Laterality: Left;  CDE 17.60  . COLONOSCOPY    . COLONOSCOPY N/A 07/08/2015   Procedure: COLONOSCOPY;  Surgeon: Rogene Houston, MD;  [facility];  Service: Endoscopy;  Laterality: N/A;  930  . ESOPHAGEAL DILATION N/A 07/08/2015   Procedure: ESOPHAGEAL DILATION;  Surgeon: Rogene Houston, MD;  [facility];  Service: Endoscopy;  Laterality: N/A;  .  ESOPHAGOGASTRODUODENOSCOPY N/A 07/08/2015   Procedure: ESOPHAGOGASTRODUODENOSCOPY (EGD);  Surgeon: Rogene Houston, MD;  [facility];  Service: Endoscopy;  Laterality: N/A;  . Hernia     Right inguinal  . HERNIA REPAIR  1961   right inguinal hernia  . UPPER GASTROINTESTINAL ENDOSCOPY      There were no vitals filed for this visit.      Subjective Assessment - 01/12/16 1304    Subjective LB is very tight today. Pain is 9/10   How long can you stand comfortably? 20 minutes.   Currently in Pain? Yes   Pain Score 9    Pain Location Back   Pain Orientation Lower   Pain Descriptors / Indicators Aching   Pain Type Chronic pain   Pain Onset More than a month ago   Pain Frequency Constant                         [facility] Adult PT Treatment/Exercise - 01/12/16 0001      Ultrasound   Ultrasound Location RT lumbar paras   Ultrasound Parameters 1.5 w/cm2 x12 mins in L sidelying   Ultrasound Goals Pain     Traction   Type of Traction Lumbar   Min (lbs) 5   Max (lbs) 75   Hold Time 99   Rest Time 5   Time  15     Manual Therapy   Manual Therapy Soft tissue mobilization   Soft tissue mobilization STW to R lumbar paraspinals/ QL and L thoracic paraspinals to decrease pain and tightness                  PT Short Term Goals - 12/23/15 1222      PT SHORT TERM GOAL #1   Title Ind with a HEP.   Period Weeks   Status Achieved  12/23/15           PT Long Term Goals - 11/24/15 1422      PT LONG TERM GOAL #1   Title Perform ADL's with pain not > 3/10.   Time 8   Period Weeks   Status New     PT LONG TERM GOAL #2   Title Stand 45 minutes with pain not > 3-4/10.   Time 8   Period Weeks   Status New               Plan - 01/12/16 1343    Clinical Impression Statement Pt did fairly well today with Rx. She responded to [place] and STW to RT LB paras and QL with decreased tightness in these areas. Pelvic traction was performed at 75#s  again with good results. Pt's pai reduced to 4/10 sfter Rx from 9/10     Rehab Potential Good   Clinical Impairments Affecting Rehab Potential Ongoing and progressing spinal pain over many years.   PT Frequency 2x / week   PT Duration 8 weeks   PT Treatment/Interventions ADLs/Self Care Home Management;Cryotherapy;Electrical Stimulation;Ultrasound;Traction;Moist Heat;Therapeutic activities;Therapeutic exercise   PT Next Visit Plan Continue with US/STW to low back as well as traction per MPT POC.   Consulted and Agree with Plan of Care Patient      Patient will benefit from skilled therapeutic intervention in order to improve the following deficits and impairments:  Decreased activity tolerance, Decreased range of motion, Pain  Visit Diagnosis: Right-sided low back pain without sciatica  Pain in thoracic spine  Abnormal posture     Problem List Patient Active Problem List   Diagnosis Date Noted  . Osteopenia of the elderly 02/19/2014  . DDD (degenerative disc disease), lumbar 12/03/2013  . Scoliosis 12/03/2013  . Chronic LBP 05/14/2013  . IBS (irritable bowel syndrome) 11/08/2012  . UC (ulcerative colitis) ([place]) 04/23/2012  . Palpitation 10/26/2011  . Hypertension 10/26/2011  . Chronic ulcerative colitis ([place]) 03/23/2011    Isebella Upshur,CHRIS, PTA 01/12/2016, 3:43 PM  [facility] [place], [place], [address] Phone: [phone]   Fax:  [phone]  Name: Lori Soto MRN: [id number] Date of Birth: Apr 03, 1936

## 2016-01-15 ENCOUNTER — Encounter: Payer: Self-pay | Admitting: Physical Therapy

## 2016-01-15 ENCOUNTER — Ambulatory Visit: Payer: Medicare Other | Admitting: Physical Therapy

## 2016-01-15 DIAGNOSIS — M545 Low back pain, unspecified: Secondary | ICD-10-CM

## 2016-01-15 DIAGNOSIS — M546 Pain in thoracic spine: Secondary | ICD-10-CM | POA: Diagnosis not present

## 2016-01-15 DIAGNOSIS — R293 Abnormal posture: Secondary | ICD-10-CM | POA: Diagnosis not present

## 2016-01-15 NOTE — Therapy (Signed)
[facility] [place], [place], [address] Phone: [phone]   Fax:  [phone]  Physical Therapy Treatment  Patient Details  Name: Lori Soto MRN: [id number] Date of Birth: Jun 09, 1935 Referring Provider: Redge Gainer MD  Encounter Date: 01/15/2016      PT End of Session - 01/15/16 1117    Visit Number 10   Number of Visits 24   Date for PT Re-Evaluation 01/23/16   PT Start Time 1117   PT Stop Time 1203   PT Time Calculation (min) 46 min   Activity Tolerance Patient tolerated treatment well   Behavior During Therapy [facility] for tasks assessed/performed      Past Medical History:  Diagnosis Date  . Anemia   . Cancer ([place])    skin cancer on nose  . Carpal tunnel syndrome, bilateral   . Cataract   . Esophagitis   . GERD (gastroesophageal reflux disease)   . H/O hiatal hernia   . Osteopenia   . Scoliosis   . Shingles   . Shingles   . Ulcerative colitis     Past Surgical History:  Procedure Laterality Date  . ABDOMINAL HYSTERECTOMY    . BUNIONECTOMY     Bilateral  . CARPAL TUNNEL RELEASE Left 03/10/2015   Procedure: LEFT CARPAL TUNNEL RELEASE;  Surgeon: Daryll Brod, MD;  Location: [facility];  Service: Orthopedics;  Laterality: Left;  . CATARACT EXTRACTION W/PHACO  05/28/2012   Procedure: CATARACT EXTRACTION PHACO AND INTRAOCULAR LENS PLACEMENT (IOC);  Surgeon: Tonny Branch, MD;  [facility]: [facility];  Service: Ophthalmology;  Laterality: Right;  CDE=12.84  . CATARACT EXTRACTION W/PHACO  06/07/2012   Procedure: CATARACT EXTRACTION PHACO AND INTRAOCULAR LENS PLACEMENT (IOC);  Surgeon: Tonny Branch, MD;  [facility]: [facility];  Service: Ophthalmology;  Laterality: Left;  CDE 17.60  . COLONOSCOPY    . COLONOSCOPY N/A 07/08/2015   Procedure: COLONOSCOPY;  Surgeon: Rogene Houston, MD;  [facility];  Service: Endoscopy;  Laterality: N/A;  930  . ESOPHAGEAL DILATION N/A 07/08/2015   Procedure: ESOPHAGEAL  DILATION;  Surgeon: Rogene Houston, MD;  [facility];  Service: Endoscopy;  Laterality: N/A;  . ESOPHAGOGASTRODUODENOSCOPY N/A 07/08/2015   Procedure: ESOPHAGOGASTRODUODENOSCOPY (EGD);  Surgeon: Rogene Houston, MD;  [facility];  Service: Endoscopy;  Laterality: N/A;  . Hernia     Right inguinal  . HERNIA REPAIR  1961   right inguinal hernia  . UPPER GASTROINTESTINAL ENDOSCOPY      There were no vitals filed for this visit.      Subjective Assessment - 01/15/16 1116    Subjective Reports feeling better than she did during previous treatment.   Limitations Standing   How long can you stand comfortably? 20 minutes.   Currently in Pain? Yes   Pain Score 8    Pain Location Back   Pain Orientation Lower   Pain Descriptors / Indicators Tightness;Other (Comment)  Stiffness   Pain Type Chronic pain   Pain Onset More than a month ago            [facility] PT Assessment - 01/15/16 0001      Assessment   Medical Diagnosis Scoliosis.     Precautions   Precautions None     Restrictions   Weight Bearing Restrictions No                     [facility] Adult PT Treatment/Exercise - 01/15/16  0001      Modalities   Modalities Ultrasound;Traction     Ultrasound   Ultrasound Location R lumbar paraspinals   Ultrasound Parameters 1.5 w/cm2, 100%, 1 mhz x10 min   Ultrasound Goals Pain     Traction   Type of Traction Lumbar   Min (lbs) 5   Max (lbs) 75   Hold Time 99   Rest Time 5   Time 15     Manual Therapy   Manual Therapy Soft tissue mobilization   Soft tissue mobilization STW to R lumbar paraspinals/ QL to decrease pain and tightness                  PT Short Term Goals - 12/23/15 1222      PT SHORT TERM GOAL #1   Title Ind with a HEP.   Period Weeks   Status Achieved  12/23/15           PT Long Term Goals - 11/24/15 1422      PT LONG TERM GOAL #1   Title Perform ADL's with pain not > 3/10.   Time 8   Period Weeks    Status New     PT LONG TERM GOAL #2   Title Stand 45 minutes with pain not > 3-4/10.   Time 8   Period Weeks   Status New               Plan - 01/15/16 1201    Clinical Impression Statement Patient tolerated today's treatment well with patient indicating that pain was predominately in upper R lumbar paraspinals region. Normal [place] response noted following end of [place] session. Tightness noted throughout R lumbar musculature today. Traction was again maintained at 75# max with no adverse reports following end of treatment.   Rehab Potential Good   Clinical Impairments Affecting Rehab Potential Ongoing and progressing spinal pain over many years.   PT Frequency 2x / week   PT Duration 8 weeks   PT Treatment/Interventions ADLs/Self Care Home Management;Cryotherapy;Electrical Stimulation;Ultrasound;Traction;Moist Heat;Therapeutic activities;Therapeutic exercise   PT Next Visit Plan Continue with US/STW to low back as well as traction per MPT POC.   Consulted and Agree with Plan of Care Patient      Patient will benefit from skilled therapeutic intervention in order to improve the following deficits and impairments:  Decreased activity tolerance, Decreased range of motion, Pain  Visit Diagnosis: Right-sided low back pain without sciatica  Pain in thoracic spine  Abnormal posture     Problem List Patient Active Problem List   Diagnosis Date Noted  . Osteopenia of the elderly 02/19/2014  . DDD (degenerative disc disease), lumbar 12/03/2013  . Scoliosis 12/03/2013  . Chronic LBP 05/14/2013  . IBS (irritable bowel syndrome) 11/08/2012  . UC (ulcerative colitis) ([place]) 04/23/2012  . Palpitation 10/26/2011  . Hypertension 10/26/2011  . Chronic ulcerative colitis ([place]) 03/23/2011    Wynelle Fanny, PTA 01/15/2016, 12:10 PM  [facility] [address] [place], [place], [address] Phone: [phone]   Fax:  [phone]  Name:  Lori Soto MRN: [id number] Date of Birth: Oct 03, 1935

## 2016-01-19 ENCOUNTER — Ambulatory Visit: Payer: Medicare Other | Admitting: Physical Therapy

## 2016-01-19 ENCOUNTER — Encounter: Payer: Self-pay | Admitting: Physical Therapy

## 2016-01-19 DIAGNOSIS — M546 Pain in thoracic spine: Secondary | ICD-10-CM | POA: Diagnosis not present

## 2016-01-19 DIAGNOSIS — M545 Low back pain, unspecified: Secondary | ICD-10-CM

## 2016-01-19 DIAGNOSIS — R293 Abnormal posture: Secondary | ICD-10-CM | POA: Diagnosis not present

## 2016-01-19 NOTE — Therapy (Signed)
[facility] [place], [place], [address] Phone: [phone]   Fax:  [phone]  Physical Therapy Treatment  Patient Details  Name: Lori Soto MRN: [id number] Date of Birth: November 18, 1935 Referring Provider: Redge Gainer MD  Encounter Date: 01/19/2016      PT End of Session - 01/19/16 1519    Visit Number 11   Number of Visits 24   Date for PT Re-Evaluation 01/23/16   PT Start Time 1519   PT Stop Time 1603   PT Time Calculation (min) 44 min   Activity Tolerance Patient tolerated treatment well   Behavior During Therapy [facility] for tasks assessed/performed      Past Medical History:  Diagnosis Date  . Anemia   . Cancer ([place])    skin cancer on nose  . Carpal tunnel syndrome, bilateral   . Cataract   . Esophagitis   . GERD (gastroesophageal reflux disease)   . H/O hiatal hernia   . Osteopenia   . Scoliosis   . Shingles   . Shingles   . Ulcerative colitis     Past Surgical History:  Procedure Laterality Date  . ABDOMINAL HYSTERECTOMY    . BUNIONECTOMY     Bilateral  . CARPAL TUNNEL RELEASE Left 03/10/2015   Procedure: LEFT CARPAL TUNNEL RELEASE;  Surgeon: Daryll Brod, MD;  Location: [facility];  Service: Orthopedics;  Laterality: Left;  . CATARACT EXTRACTION W/PHACO  05/28/2012   Procedure: CATARACT EXTRACTION PHACO AND INTRAOCULAR LENS PLACEMENT (IOC);  Surgeon: Tonny Branch, MD;  [facility];  Service: Ophthalmology;  Laterality: Right;  CDE=12.84  . CATARACT EXTRACTION W/PHACO  06/07/2012   Procedure: CATARACT EXTRACTION PHACO AND INTRAOCULAR LENS PLACEMENT (IOC);  Surgeon: Tonny Branch, MD;  [facility]: [facility];  Service: Ophthalmology;  Laterality: Left;  CDE 17.60  . COLONOSCOPY    . COLONOSCOPY N/A 07/08/2015   Procedure: COLONOSCOPY;  Surgeon: Rogene Houston, MD;  [facility];  Service: Endoscopy;  Laterality: N/A;  930  . ESOPHAGEAL DILATION N/A 07/08/2015   Procedure: ESOPHAGEAL  DILATION;  Surgeon: Rogene Houston, MD;  [facility];  Service: Endoscopy;  Laterality: N/A;  . ESOPHAGOGASTRODUODENOSCOPY N/A 07/08/2015   Procedure: ESOPHAGOGASTRODUODENOSCOPY (EGD);  Surgeon: Rogene Houston, MD;  [facility];  Service: Endoscopy;  Laterality: N/A;  . Hernia     Right inguinal  . HERNIA REPAIR  1961   right inguinal hernia  . UPPER GASTROINTESTINAL ENDOSCOPY      There were no vitals filed for this visit.      Subjective Assessment - 01/19/16 1518    Subjective Reports that her back did fairly well with her increased activity. Used Salonpas patches friday and did rest at times and walked.    Limitations Standing   How long can you stand comfortably? 20 minutes.   Currently in Pain? (P)  Yes   Pain Score (P)  9    Pain Location (P)  Back   Pain Orientation (P)  Lower   Pain Type (P)  Chronic pain   Pain Onset (P)  More than a month ago            [facility] PT Assessment - 01/19/16 0001      Assessment   Medical Diagnosis Scoliosis.     Precautions   Precautions None     Restrictions   Weight Bearing Restrictions No  [facility] Adult PT Treatment/Exercise - 01/19/16 0001      Modalities   Modalities Ultrasound;Traction     Ultrasound   Ultrasound Location R lumbar paraspinals   Ultrasound Parameters 1.5 w/cm2, 100%, 1 mhz x10 min   Ultrasound Goals Pain     Traction   Type of Traction Lumbar   Min (lbs) 5   Max (lbs) 75   Hold Time 99   Rest Time 5   Time 15     Manual Therapy   Manual Therapy Soft tissue mobilization   Soft tissue mobilization STW to R lumbar paraspinals/ QL to decrease pain and tightness                  PT Short Term Goals - 12/23/15 1222      PT SHORT TERM GOAL #1   Title Ind with a HEP.   Period Weeks   Status Achieved  12/23/15           PT Long Term Goals - 11/24/15 1422      PT LONG TERM GOAL #1   Title Perform ADL's with pain not > 3/10.    Time 8   Period Weeks   Status New     PT LONG TERM GOAL #2   Title Stand 45 minutes with pain not > 3-4/10.   Time 8   Period Weeks   Status New               Plan - 01/19/16 1606    Clinical Impression Statement Patient arrived to treatment with increased R low back pain following a very busy weekend for her. Patient presented in treatment with tightness of R QL today but no adverse reports during manual therapy. Normal modalities response noted following removal of the modalities. Patient's goals remain on-going secondary to pain that patient continues to experience.   Rehab Potential Good   Clinical Impairments Affecting Rehab Potential Ongoing and progressing spinal pain over many years.   PT Frequency 2x / week   PT Duration 8 weeks   PT Treatment/Interventions ADLs/Self Care Home Management;Cryotherapy;Electrical Stimulation;Ultrasound;Traction;Moist Heat;Therapeutic activities;Therapeutic exercise   PT Next Visit Plan Continue with US/STW to low back as well as traction per MPT POC.   Consulted and Agree with Plan of Care Patient      Patient will benefit from skilled therapeutic intervention in order to improve the following deficits and impairments:  Decreased activity tolerance, Decreased range of motion, Pain  Visit Diagnosis: Right-sided low back pain without sciatica  Pain in thoracic spine  Abnormal posture     Problem List Patient Active Problem List   Diagnosis Date Noted  . Osteopenia of the elderly 02/19/2014  . DDD (degenerative disc disease), lumbar 12/03/2013  . Scoliosis 12/03/2013  . Chronic LBP 05/14/2013  . IBS (irritable bowel syndrome) 11/08/2012  . UC (ulcerative colitis) ([place]) 04/23/2012  . Palpitation 10/26/2011  . Hypertension 10/26/2011  . Chronic ulcerative colitis ([place]) 03/23/2011    Wynelle Fanny, PTA 01/19/2016, [facility] [address] [place], [place],  [address] Phone: [phone]   Fax:  [phone]  Name: Lori Soto MRN: [id number] Date of Birth: Apr 03, 1936

## 2016-01-22 ENCOUNTER — Ambulatory Visit: Payer: Medicare Other | Admitting: Physical Therapy

## 2016-01-22 ENCOUNTER — Encounter: Payer: Self-pay | Admitting: Physical Therapy

## 2016-01-22 DIAGNOSIS — M545 Low back pain, unspecified: Secondary | ICD-10-CM

## 2016-01-22 DIAGNOSIS — M546 Pain in thoracic spine: Secondary | ICD-10-CM

## 2016-01-22 DIAGNOSIS — R293 Abnormal posture: Secondary | ICD-10-CM

## 2016-01-22 NOTE — Therapy (Signed)
[facility] [place], [place], [address] Phone: [phone]   Fax:  [phone]  Physical Therapy Treatment  Patient Details  Name: Lori Soto MRN: [id number] Date of Birth: [birth year] Referring Provider: Redge Gainer MD  Encounter Date: 01/22/2016      PT End of Session - 01/22/16 1113    Visit Number 12   Number of Visits 24   Date for PT Re-Evaluation 01/23/16   PT Start Time 1118   PT Stop Time 1206   PT Time Calculation (min) 48 min   Activity Tolerance Patient tolerated treatment well   Behavior During Therapy [facility] for tasks assessed/performed      Past Medical History:  Diagnosis Date  . Anemia   . Cancer ([place])    skin cancer on nose  . Carpal tunnel syndrome, bilateral   . Cataract   . Esophagitis   . GERD (gastroesophageal reflux disease)   . H/O hiatal hernia   . Osteopenia   . Scoliosis   . Shingles   . Shingles   . Ulcerative colitis     Past Surgical History:  Procedure Laterality Date  . ABDOMINAL HYSTERECTOMY    . BUNIONECTOMY     Bilateral  . CARPAL TUNNEL RELEASE Left 03/10/2015   Procedure: LEFT CARPAL TUNNEL RELEASE;  Surgeon: Daryll Brod, MD;  Location: [facility];  Service: Orthopedics;  Laterality: Left;  . CATARACT EXTRACTION W/PHACO  05/28/2012   Procedure: CATARACT EXTRACTION PHACO AND INTRAOCULAR LENS PLACEMENT (IOC);  Surgeon: Tonny Branch, MD;  [facility];  Service: Ophthalmology;  Laterality: Right;  CDE=12.84  . CATARACT EXTRACTION W/PHACO  06/07/2012   Procedure: CATARACT EXTRACTION PHACO AND INTRAOCULAR LENS PLACEMENT (IOC);  Surgeon: Tonny Branch, MD;  [facility]: [facility];  Service: Ophthalmology;  Laterality: Left;  CDE 17.60  . COLONOSCOPY    . COLONOSCOPY N/A 07/08/2015   Procedure: COLONOSCOPY;  Surgeon: Rogene Houston, MD;  [facility];  Service: Endoscopy;  Laterality: N/A;  930  . ESOPHAGEAL DILATION N/A 07/08/2015   Procedure: ESOPHAGEAL  DILATION;  Surgeon: Rogene Houston, MD;  [facility];  Service: Endoscopy;  Laterality: N/A;  . ESOPHAGOGASTRODUODENOSCOPY N/A 07/08/2015   Procedure: ESOPHAGOGASTRODUODENOSCOPY (EGD);  Surgeon: Rogene Houston, MD;  [facility];  Service: Endoscopy;  Laterality: N/A;  . Hernia     Right inguinal  . HERNIA REPAIR  1961   right inguinal hernia  . UPPER GASTROINTESTINAL ENDOSCOPY      There were no vitals filed for this visit.      Subjective Assessment - 01/22/16 1113    Subjective Reports that yesterday with cooking all day she had increased pain. Today she feels okay as she has only made phone calls and sat. States that pain averages at 5/10.   Limitations Standing   How long can you stand comfortably? 20 minutes.   Currently in Pain? Yes   Pain Score 5    Pain Location Back   Pain Orientation Lower   Pain Type Chronic pain   Pain Onset More than a month ago            [facility] PT Assessment - 01/22/16 0001      Assessment   Medical Diagnosis Scoliosis.     Precautions   Precautions None     Restrictions   Weight Bearing Restrictions No  [facility] Adult PT Treatment/Exercise - 01/22/16 0001      Modalities   Modalities Ultrasound;Traction     Ultrasound   Ultrasound Location R lumbar paraspinals   Ultrasound Parameters 1.5 w/cm2, 100%, 1 mhz x10 min     Traction   Type of Traction Lumbar   Min (lbs) 5   Max (lbs) 75   Hold Time 99   Rest Time 5   Time 15     Manual Therapy   Manual Therapy Soft tissue mobilization   Soft tissue mobilization STW to R lumbar paraspinals/ QL to decrease pain and tightness                  PT Short Term Goals - 12/23/15 1222      PT SHORT TERM GOAL #1   Title Ind with a HEP.   Period Weeks   Status Achieved  12/23/15           PT Long Term Goals - 01/22/16 1211      PT LONG TERM GOAL #1   Title Perform ADL's with pain not > 3/10.   Time 8   Period  Weeks   Status On-going     PT LONG TERM GOAL #2   Title Stand 45 minutes with pain not > 3-4/10.   Time 8   Period Weeks   Status On-going               Plan - 01/22/16 1202    Clinical Impression Statement Patient arrived to treatment with her average number of pain (5/10) but reported increased pain yesterday with cooking and activity. Tightness predominately noted in R QL today during manual therapy. Normal modalities response noted following removal of the modalities. Goals remain on-going secondary to continued pain. Patient had no adverse response following at traction again at 75#.   Rehab Potential Good   Clinical Impairments Affecting Rehab Potential Ongoing and progressing spinal pain over many years.   PT Frequency 2x / week   PT Duration 8 weeks   PT Treatment/Interventions ADLs/Self Care Home Management;Cryotherapy;Electrical Stimulation;Ultrasound;Traction;Moist Heat;Therapeutic activities;Therapeutic exercise   PT Next Visit Plan Continue with US/STW to low back as well as traction per MPT POC.   Consulted and Agree with Plan of Care Patient      Patient will benefit from skilled therapeutic intervention in order to improve the following deficits and impairments:  Decreased activity tolerance, Decreased range of motion, Pain  Visit Diagnosis: Right-sided low back pain without sciatica  Pain in thoracic spine  Abnormal posture     Problem List Patient Active Problem List   Diagnosis Date Noted  . Osteopenia of the elderly 02/19/2014  . DDD (degenerative disc disease), lumbar 12/03/2013  . Scoliosis 12/03/2013  . Chronic LBP 05/14/2013  . IBS (irritable bowel syndrome) 11/08/2012  . UC (ulcerative colitis) ([place]) 04/23/2012  . Palpitation 10/26/2011  . Hypertension 10/26/2011  . Chronic ulcerative colitis ([place]) 03/23/2011    Wynelle Fanny, PTA 01/22/2016, 12:12 PM  [facility] [place], [place], [address] Phone: [phone]   Fax:  [phone]  Name: Berenice Oehlert MRN: [id number] Date of Birth: 04/20/1936

## 2016-01-26 ENCOUNTER — Ambulatory Visit: Payer: Medicare Other | Admitting: *Deleted

## 2016-01-26 DIAGNOSIS — D235 Other benign neoplasm of skin of trunk: Secondary | ICD-10-CM | POA: Diagnosis not present

## 2016-01-26 DIAGNOSIS — R87622 Low grade squamous intraepithelial lesion on cytologic smear of vagina (LGSIL): Secondary | ICD-10-CM | POA: Diagnosis not present

## 2016-01-26 DIAGNOSIS — L814 Other melanin hyperpigmentation: Secondary | ICD-10-CM | POA: Diagnosis not present

## 2016-01-26 DIAGNOSIS — Z6824 Body mass index (BMI) 24.0-24.9, adult: Secondary | ICD-10-CM | POA: Diagnosis not present

## 2016-01-26 DIAGNOSIS — R293 Abnormal posture: Secondary | ICD-10-CM | POA: Diagnosis not present

## 2016-01-26 DIAGNOSIS — M546 Pain in thoracic spine: Secondary | ICD-10-CM | POA: Diagnosis not present

## 2016-01-26 DIAGNOSIS — M545 Low back pain, unspecified: Secondary | ICD-10-CM

## 2016-01-26 DIAGNOSIS — Z01419 Encounter for gynecological examination (general) (routine) without abnormal findings: Secondary | ICD-10-CM | POA: Diagnosis not present

## 2016-01-26 DIAGNOSIS — D1801 Hemangioma of skin and subcutaneous tissue: Secondary | ICD-10-CM | POA: Diagnosis not present

## 2016-01-26 DIAGNOSIS — L821 Other seborrheic keratosis: Secondary | ICD-10-CM | POA: Diagnosis not present

## 2016-01-26 NOTE — Therapy (Signed)
[facility] [place], [place], [address] Phone: [phone]   Fax:  [phone]  Physical Therapy Treatment  Patient Details  Name: Lori Soto MRN: [id number] Date of Birth: May 20, 1935 Referring Provider: Redge Gainer MD  Encounter Date: 01/26/2016      PT End of Session - 01/26/16 1339    Visit Number 13   Number of Visits 24   Date for PT Re-Evaluation 01/23/16   PT Start Time 1300   PT Stop Time 1350   PT Time Calculation (min) 50 min      Past Medical History:  Diagnosis Date  . Anemia   . Cancer ([place])    skin cancer on nose  . Carpal tunnel syndrome, bilateral   . Cataract   . Esophagitis   . GERD (gastroesophageal reflux disease)   . H/O hiatal hernia   . Osteopenia   . Scoliosis   . Shingles   . Shingles   . Ulcerative colitis     Past Surgical History:  Procedure Laterality Date  . ABDOMINAL HYSTERECTOMY    . BUNIONECTOMY     Bilateral  . CARPAL TUNNEL RELEASE Left 03/10/2015   Procedure: LEFT CARPAL TUNNEL RELEASE;  Surgeon: Daryll Brod, MD;  Location: [facility];  Service: Orthopedics;  Laterality: Left;  . CATARACT EXTRACTION W/PHACO  05/28/2012   Procedure: CATARACT EXTRACTION PHACO AND INTRAOCULAR LENS PLACEMENT (IOC);  Surgeon: Tonny Branch, MD;  [facility];  Service: Ophthalmology;  Laterality: Right;  CDE=12.84  . CATARACT EXTRACTION W/PHACO  06/07/2012   Procedure: CATARACT EXTRACTION PHACO AND INTRAOCULAR LENS PLACEMENT (IOC);  Surgeon: Tonny Branch, MD;  [facility];  Service: Ophthalmology;  Laterality: Left;  CDE 17.60  . COLONOSCOPY    . COLONOSCOPY N/A 07/08/2015   Procedure: COLONOSCOPY;  Surgeon: Rogene Houston, MD;  [facility];  Service: Endoscopy;  Laterality: N/A;  930  . ESOPHAGEAL DILATION N/A 07/08/2015   Procedure: ESOPHAGEAL DILATION;  Surgeon: Rogene Houston, MD;  [facility];  Service: Endoscopy;  Laterality: N/A;  .  ESOPHAGOGASTRODUODENOSCOPY N/A 07/08/2015   Procedure: ESOPHAGOGASTRODUODENOSCOPY (EGD);  Surgeon: Rogene Houston, MD;  [facility];  Service: Endoscopy;  Laterality: N/A;  . Hernia     Right inguinal  . HERNIA REPAIR  1961   right inguinal hernia  . UPPER GASTROINTESTINAL ENDOSCOPY      There were no vitals filed for this visit.      Subjective Assessment - 01/26/16 1257    Limitations Standing   How long can you stand comfortably? 20 minutes.   Pain Location Back   Pain Orientation Lower   Pain Descriptors / Indicators Tightness   Pain Type Chronic pain   Pain Onset More than a month ago   Pain Frequency Constant                         [facility] Adult PT Treatment/Exercise - 01/26/16 0001      Ultrasound   Ultrasound Location RT lumbar paras and QL   Ultrasound Parameters 1.5 w/cm2 x 10 mins in prone   Ultrasound Goals Pain     Traction   Type of Traction Lumbar   Min (lbs) 5   Max (lbs) 75   Hold Time 99   Rest Time 5   Time 15     Manual Therapy   Manual Therapy Soft tissue mobilization   Soft  tissue mobilization STW to R lumbar paraspinals/ QL to decrease pain and tightness                  PT Short Term Goals - 12/23/15 1222      PT SHORT TERM GOAL #1   Title Ind with a HEP.   Period Weeks   Status Achieved  12/23/15           PT Long Term Goals - 01/22/16 1211      PT LONG TERM GOAL #1   Title Perform ADL's with pain not > 3/10.   Time 8   Period Weeks   Status On-going     PT LONG TERM GOAL #2   Title Stand 45 minutes with pain not > 3-4/10.   Time 8   Period Weeks   Status On-going               Plan - 01/26/16 1344    Clinical Impression Statement  Pt arrives to clinic today in 5/10 pain in LB. She did fairly well with Rx today and continues to get relief from PT. Notable tightness in LB paras and QL, but decreased after Rx. Pain was 2-3/10  after traction today. LTGs are ongoing due to ADL,  and walking pain gets higher than 2-3/10 and are ongoing   Rehab Potential Good   Clinical Impairments Affecting Rehab Potential Ongoing and progressing spinal pain over many years.   PT Frequency 2x / week   PT Duration 8 weeks   PT Treatment/Interventions ADLs/Self Care Home Management;Cryotherapy;Electrical Stimulation;Ultrasound;Traction;Moist Heat;Therapeutic activities;Therapeutic exercise   PT Next Visit Plan Continue with US/STW to low back as well as traction per MPT POC.      Patient will benefit from skilled therapeutic intervention in order to improve the following deficits and impairments:  Decreased activity tolerance, Decreased range of motion, Pain  Visit Diagnosis: Right-sided low back pain without sciatica  Pain in thoracic spine  Abnormal posture     Problem List Patient Active Problem List   Diagnosis Date Noted  . Osteopenia of the elderly 02/19/2014  . DDD (degenerative disc disease), lumbar 12/03/2013  . Scoliosis 12/03/2013  . Chronic LBP 05/14/2013  . IBS (irritable bowel syndrome) 11/08/2012  . UC (ulcerative colitis) ([place]) 04/23/2012  . Palpitation 10/26/2011  . Hypertension 10/26/2011  . Chronic ulcerative colitis ([place]) 03/23/2011    RAMSEUR,CHRIS, PTA 01/26/2016, 2:03 PM  [facility] [place], [place], [address] Phone: [phone]   Fax:  [phone]  Name: Cleola Perryman MRN: [id number] Date of Birth: Aug 03, 1935

## 2016-01-29 ENCOUNTER — Ambulatory Visit: Payer: Medicare Other | Admitting: Physical Therapy

## 2016-01-29 ENCOUNTER — Encounter: Payer: Self-pay | Admitting: Physical Therapy

## 2016-01-29 DIAGNOSIS — M546 Pain in thoracic spine: Secondary | ICD-10-CM | POA: Diagnosis not present

## 2016-01-29 DIAGNOSIS — M545 Low back pain, unspecified: Secondary | ICD-10-CM

## 2016-01-29 DIAGNOSIS — R293 Abnormal posture: Secondary | ICD-10-CM

## 2016-01-29 NOTE — Therapy (Signed)
[facility] [place], [place], [address] Phone: [phone]   Fax:  [phone]  Physical Therapy Treatment  Patient Details  Name: Lori Soto MRN: [id number] Date of Birth: May 18, 1935 Referring Provider: Redge Gainer MD  Encounter Date: 01/29/2016      PT End of Session - 01/29/16 1123    Visit Number 14   Number of Visits 24   Date for PT Re-Evaluation 01/23/16   PT Start Time 1324   PT Stop Time 1215   PT Time Calculation (min) 51 min   Activity Tolerance Patient tolerated treatment well   Behavior During Therapy [facility] for tasks assessed/performed      Past Medical History:  Diagnosis Date  . Anemia   . Cancer ([place])    skin cancer on nose  . Carpal tunnel syndrome, bilateral   . Cataract   . Esophagitis   . GERD (gastroesophageal reflux disease)   . H/O hiatal hernia   . Osteopenia   . Scoliosis   . Shingles   . Shingles   . Ulcerative colitis     Past Surgical History:  Procedure Laterality Date  . ABDOMINAL HYSTERECTOMY    . BUNIONECTOMY     Bilateral  . CARPAL TUNNEL RELEASE Left 03/10/2015   Procedure: LEFT CARPAL TUNNEL RELEASE;  Surgeon: Daryll Brod, MD;  Location: [place];  Service: Orthopedics;  Laterality: Left;  . CATARACT EXTRACTION W/PHACO  05/28/2012   Procedure: CATARACT EXTRACTION PHACO AND INTRAOCULAR LENS PLACEMENT (IOC);  Surgeon: Tonny Branch, MD;  [facility];  Service: Ophthalmology;  Laterality: Right;  CDE=12.84  . CATARACT EXTRACTION W/PHACO  06/07/2012   Procedure: CATARACT EXTRACTION PHACO AND INTRAOCULAR LENS PLACEMENT (IOC);  Surgeon: Tonny Branch, MD;  [facility];  Service: Ophthalmology;  Laterality: Left;  CDE 17.60  . COLONOSCOPY    . COLONOSCOPY N/A 07/08/2015   Procedure: COLONOSCOPY;  Surgeon: Rogene Houston, MD;  [facility];  Service: Endoscopy;  Laterality: N/A;  930  . ESOPHAGEAL DILATION N/A 07/08/2015   Procedure: ESOPHAGEAL  DILATION;  Surgeon: Rogene Houston, MD;  [facility];  Service: Endoscopy;  Laterality: N/A;  . ESOPHAGOGASTRODUODENOSCOPY N/A 07/08/2015   Procedure: ESOPHAGOGASTRODUODENOSCOPY (EGD);  Surgeon: Rogene Houston, MD;  [facility];  Service: Endoscopy;  Laterality: N/A;  . Hernia     Right inguinal  . HERNIA REPAIR  1961   right inguinal hernia  . UPPER GASTROINTESTINAL ENDOSCOPY      There were no vitals filed for this visit.      Subjective Assessment - 01/29/16 1122    Subjective Reports she has been standing and completing tasks in the kitchen this morning.   Limitations Standing   How long can you stand comfortably? 20 minutes.   Currently in Pain? Yes   Pain Score 8    Pain Location Back   Pain Orientation Lower   Pain Descriptors / Indicators Tightness   Pain Type Chronic pain   Pain Onset More than a month ago                         [facility] Adult PT Treatment/Exercise - 01/29/16 0001      Modalities   Modalities Ultrasound;Traction     Ultrasound   Ultrasound Location R lumbar paraspinal   Ultrasound Parameters 1.5 w/cm2 ,100%,1 mhz x10 min   Ultrasound Goals Pain     Traction  Type of Traction Lumbar   Min (lbs) 5   Max (lbs) 75   Hold Time 99   Rest Time 5   Time 15     Manual Therapy   Manual Therapy Soft tissue mobilization   Soft tissue mobilization STW to R lumbar paraspinals/ QL to decrease pain and tightness                  PT Short Term Goals - 12/23/15 1222      PT SHORT TERM GOAL #1   Title Ind with a HEP.   Period Weeks   Status Achieved  12/23/15           PT Long Term Goals - 01/22/16 1211      PT LONG TERM GOAL #1   Title Perform ADL's with pain not > 3/10.   Time 8   Period Weeks   Status On-going     PT LONG TERM GOAL #2   Title Stand 45 minutes with pain not > 3-4/10.   Time 8   Period Weeks   Status On-going               Plan - 01/29/16 1207    Clinical  Impression Statement Patient arrived to treatment with increased low back pain as she has done an extended amount of standing in her kitchen today. Tightness located predominately in R QL region and patient reported discomfort into inferior lumbar paraspinals as well. Normal modalities response noted following removal of the modalities. Mechanical lumbar traction maintained at 75# today.   Rehab Potential Good   Clinical Impairments Affecting Rehab Potential Ongoing and progressing spinal pain over many years.   PT Frequency 2x / week   PT Duration 8 weeks   PT Treatment/Interventions ADLs/Self Care Home Management;Cryotherapy;Electrical Stimulation;Ultrasound;Traction;Moist Heat;Therapeutic activities;Therapeutic exercise   PT Next Visit Plan Continue with US/STW to low back as well as traction per MPT POC.   Consulted and Agree with Plan of Care Patient      Patient will benefit from skilled therapeutic intervention in order to improve the following deficits and impairments:  Decreased activity tolerance, Decreased range of motion, Pain  Visit Diagnosis: Right-sided low back pain without sciatica  Pain in thoracic spine  Abnormal posture     Problem List Patient Active Problem List   Diagnosis Date Noted  . Osteopenia of the elderly 02/19/2014  . DDD (degenerative disc disease), lumbar 12/03/2013  . Scoliosis 12/03/2013  . Chronic LBP 05/14/2013  . IBS (irritable bowel syndrome) 11/08/2012  . UC (ulcerative colitis) ([place]) 04/23/2012  . Palpitation 10/26/2011  . Hypertension 10/26/2011  . Chronic ulcerative colitis ([place]) 03/23/2011    Wynelle Fanny, PTA 01/29/2016, 12:19 PM  [facility] [address] [place], [place], [address] Phone: [phone]   Fax:  [phone]  Name: Lori Soto MRN: [id number] Date of Birth: May 29, 1935

## 2016-02-02 ENCOUNTER — Ambulatory Visit: Payer: Medicare Other | Attending: Family Medicine | Admitting: Physical Therapy

## 2016-02-02 DIAGNOSIS — G8929 Other chronic pain: Secondary | ICD-10-CM

## 2016-02-02 DIAGNOSIS — R293 Abnormal posture: Secondary | ICD-10-CM | POA: Diagnosis not present

## 2016-02-02 DIAGNOSIS — M546 Pain in thoracic spine: Secondary | ICD-10-CM | POA: Insufficient documentation

## 2016-02-02 DIAGNOSIS — M545 Low back pain: Secondary | ICD-10-CM | POA: Diagnosis not present

## 2016-02-02 NOTE — Therapy (Signed)
[facility] [place], [place], [address] Phone: [phone]   Fax:  [phone]  Physical Therapy Treatment  Patient Details  Name: Lori Soto MRN: [id number] Date of Birth: Jun 03, 1935 Referring Provider: Redge Gainer MD  Encounter Date: 02/02/2016      PT End of Session - 02/02/16 0948    Visit Number 15   Number of Visits 24   Date for PT Re-Evaluation 01/23/16   PT Start Time 0948   PT Stop Time 1036   PT Time Calculation (min) 48 min   Activity Tolerance Patient tolerated treatment well   Behavior During Therapy [facility] for tasks assessed/performed      Past Medical History:  Diagnosis Date  . Anemia   . Cancer ([place])    skin cancer on nose  . Carpal tunnel syndrome, bilateral   . Cataract   . Esophagitis   . GERD (gastroesophageal reflux disease)   . H/O hiatal hernia   . Osteopenia   . Scoliosis   . Shingles   . Shingles   . Ulcerative colitis     Past Surgical History:  Procedure Laterality Date  . ABDOMINAL HYSTERECTOMY    . BUNIONECTOMY     Bilateral  . CARPAL TUNNEL RELEASE Left 03/10/2015   Procedure: LEFT CARPAL TUNNEL RELEASE;  Surgeon: Daryll Brod, MD;  Location: [place];  Service: Orthopedics;  Laterality: Left;  . CATARACT EXTRACTION W/PHACO  05/28/2012   Procedure: CATARACT EXTRACTION PHACO AND INTRAOCULAR LENS PLACEMENT (IOC);  Surgeon: Tonny Branch, MD;  [facility];  Service: Ophthalmology;  Laterality: Right;  CDE=12.84  . CATARACT EXTRACTION W/PHACO  06/07/2012   Procedure: CATARACT EXTRACTION PHACO AND INTRAOCULAR LENS PLACEMENT (IOC);  Surgeon: Tonny Branch, MD;  [facility];  Service: Ophthalmology;  Laterality: Left;  CDE 17.60  . COLONOSCOPY    . COLONOSCOPY N/A 07/08/2015   Procedure: COLONOSCOPY;  Surgeon: Rogene Houston, MD;  [facility];  Service: Endoscopy;  Laterality: N/A;  930  . ESOPHAGEAL DILATION N/A 07/08/2015   Procedure: ESOPHAGEAL  DILATION;  Surgeon: Rogene Houston, MD;  [facility];  Service: Endoscopy;  Laterality: N/A;  . ESOPHAGOGASTRODUODENOSCOPY N/A 07/08/2015   Procedure: ESOPHAGOGASTRODUODENOSCOPY (EGD);  Surgeon: Rogene Houston, MD;  [facility];  Service: Endoscopy;  Laterality: N/A;  . Hernia     Right inguinal  . HERNIA REPAIR  1961   right inguinal hernia  . UPPER GASTROINTESTINAL ENDOSCOPY      There were no vitals filed for this visit.      Subjective Assessment - 02/02/16 0947    Subjective Reports that it is early so she has some tightness.   Limitations Standing   How long can you stand comfortably? 20 minutes.   Currently in Pain? Yes   Pain Location Back   Pain Orientation Lower   Pain Descriptors / Indicators Tightness   Pain Type Chronic pain   Pain Onset More than a month ago                         [facility] Adult PT Treatment/Exercise - 02/02/16 0001      Ultrasound   Ultrasound Location R lumbar paraspinals/ QL   Ultrasound Parameters 1.5 w/cm2, 100%, 9mz x10 min   Ultrasound Goals Pain     Traction   Type of Traction Lumbar   Min (lbs) 5   Max (lbs) 75  Hold Time 99   Rest Time 5   Time 15     Manual Therapy   Manual Therapy Soft tissue mobilization   Soft tissue mobilization STW to R lumbar paraspinals/ QL to decrease pain and tightness                  PT Short Term Goals - 12/23/15 1222      PT SHORT TERM GOAL #1   Title Ind with a HEP.   Period Weeks   Status Achieved  12/23/15           PT Long Term Goals - 01/22/16 1211      PT LONG TERM GOAL #1   Title Perform ADL's with pain not > 3/10.   Time 8   Period Weeks   Status On-going     PT LONG TERM GOAL #2   Title Stand 45 minutes with pain not > 3-4/10.   Time 8   Period Weeks   Status On-going               Plan - 02/02/16 1020    Clinical Impression Statement Patient arrived to treatment with continued tightness in R low back.  Patient indicated that she had some tightness and discomfort in R upper glute region prior to initiating treatment. Tightness noted in R upper glute, lumbar paraspinals, QL with manual therapy. Normal modalities response noted following today's treatment with traction at 75#.   Rehab Potential Good   Clinical Impairments Affecting Rehab Potential Ongoing and progressing spinal pain over many years.   PT Frequency 2x / week   PT Duration 8 weeks   PT Treatment/Interventions ADLs/Self Care Home Management;Cryotherapy;Electrical Stimulation;Ultrasound;Traction;Moist Heat;Therapeutic activities;Therapeutic exercise   PT Next Visit Plan Continue with US/STW to low back as well as traction per MPT POC.   Consulted and Agree with Plan of Care Patient      Patient will benefit from skilled therapeutic intervention in order to improve the following deficits and impairments:  Decreased activity tolerance, Decreased range of motion, Pain  Visit Diagnosis: Chronic right-sided low back pain without sciatica  Pain in thoracic spine  Abnormal posture     Problem List Patient Active Problem List   Diagnosis Date Noted  . Osteopenia of the elderly 02/19/2014  . DDD (degenerative disc disease), lumbar 12/03/2013  . Scoliosis 12/03/2013  . Chronic LBP 05/14/2013  . IBS (irritable bowel syndrome) 11/08/2012  . UC (ulcerative colitis) ([place]) 04/23/2012  . Palpitation 10/26/2011  . Hypertension 10/26/2011  . Chronic ulcerative colitis ([place]) 03/23/2011    Wynelle Fanny, PTA 02/02/2016, 10:38 AM  [facility] [address] [place], [place], [address] Phone: [phone]   Fax:  [phone]  Name: Sabrinna Yearwood MRN: [id number] Date of Birth: [birth year]

## 2016-02-05 ENCOUNTER — Other Ambulatory Visit (INDEPENDENT_AMBULATORY_CARE_PROVIDER_SITE_OTHER): Payer: Self-pay | Admitting: Internal Medicine

## 2016-02-05 ENCOUNTER — Ambulatory Visit: Payer: Medicare Other | Admitting: *Deleted

## 2016-02-05 DIAGNOSIS — M545 Low back pain: Secondary | ICD-10-CM | POA: Diagnosis not present

## 2016-02-05 DIAGNOSIS — R293 Abnormal posture: Secondary | ICD-10-CM | POA: Diagnosis not present

## 2016-02-05 DIAGNOSIS — G8929 Other chronic pain: Secondary | ICD-10-CM

## 2016-02-05 DIAGNOSIS — M546 Pain in thoracic spine: Secondary | ICD-10-CM

## 2016-02-05 NOTE — Therapy (Signed)
[facility] [place], [place], [address] Phone: [phone]   Fax:  [phone]  Physical Therapy Treatment  Patient Details  Name: Lori Soto MRN: [id number] Date of Birth: 1935-05-16 Referring Provider: Redge Gainer MD  Encounter Date: 02/05/2016      PT End of Session - 02/05/16 1114    Visit Number 16   Number of Visits 24   Date for PT Re-Evaluation 03/23/16   PT Start Time 1030   PT Stop Time 1120   PT Time Calculation (min) 50 min      Past Medical History:  Diagnosis Date  . Anemia   . Cancer ([place])    skin cancer on nose  . Carpal tunnel syndrome, bilateral   . Cataract   . Esophagitis   . GERD (gastroesophageal reflux disease)   . H/O hiatal hernia   . Osteopenia   . Scoliosis   . Shingles   . Shingles   . Ulcerative colitis     Past Surgical History:  Procedure Laterality Date  . ABDOMINAL HYSTERECTOMY    . BUNIONECTOMY     Bilateral  . CARPAL TUNNEL RELEASE Left 03/10/2015   Procedure: LEFT CARPAL TUNNEL RELEASE;  Surgeon: Daryll Brod, MD;  Location: [place];  Service: Orthopedics;  Laterality: Left;  . CATARACT EXTRACTION W/PHACO  05/28/2012   Procedure: CATARACT EXTRACTION PHACO AND INTRAOCULAR LENS PLACEMENT (IOC);  Surgeon: Tonny Branch, MD;  [facility];  Service: Ophthalmology;  Laterality: Right;  CDE=12.84  . CATARACT EXTRACTION W/PHACO  06/07/2012   Procedure: CATARACT EXTRACTION PHACO AND INTRAOCULAR LENS PLACEMENT (IOC);  Surgeon: Tonny Branch, MD;  [facility];  Service: Ophthalmology;  Laterality: Left;  CDE 17.60  . COLONOSCOPY    . COLONOSCOPY N/A 07/08/2015   Procedure: COLONOSCOPY;  Surgeon: Rogene Houston, MD;  [facility];  Service: Endoscopy;  Laterality: N/A;  930  . ESOPHAGEAL DILATION N/A 07/08/2015   Procedure: ESOPHAGEAL DILATION;  Surgeon: Rogene Houston, MD;  [facility];  Service: Endoscopy;  Laterality: N/A;  .  ESOPHAGOGASTRODUODENOSCOPY N/A 07/08/2015   Procedure: ESOPHAGOGASTRODUODENOSCOPY (EGD);  Surgeon: Rogene Houston, MD;  [facility];  Service: Endoscopy;  Laterality: N/A;  . Hernia     Right inguinal  . HERNIA REPAIR  1961   right inguinal hernia  . UPPER GASTROINTESTINAL ENDOSCOPY      There were no vitals filed for this visit.      Subjective Assessment - 02/05/16 1107    Subjective coming 2xweek has really made a difference. I can stand longer with less pain to do my ADL's    Limitations Standing   How long can you stand comfortably? 20 minutes.   Currently in Pain? Yes   Pain Score 6    Pain Location Back   Pain Orientation Lower   Pain Descriptors / Indicators Tightness   Pain Type Chronic pain   Pain Onset More than a month ago   Pain Frequency Constant   Aggravating Factors  Standing   Pain Relieving Factors PT Rxs                         [facility] Adult PT Treatment/Exercise - 02/05/16 0001      Modalities   Modalities Ultrasound;Traction     Traction   Type of Traction Lumbar   Min (lbs) 5   Max (lbs) 75   Hold Time  99   Rest Time 5   Time 15     Manual Therapy   Manual Therapy Soft tissue mobilization   Soft tissue mobilization STW and TPR  to R lumbar paraspinals/ QL to decrease pain and tightness                  PT Short Term Goals - 12/23/15 1222      PT SHORT TERM GOAL #1   Title Ind with a HEP.   Period Weeks   Status Achieved  12/23/15           PT Long Term Goals - 01/22/16 1211      PT LONG TERM GOAL #1   Title Perform ADL's with pain not > 3/10.   Time 8   Period Weeks   Status On-going     PT LONG TERM GOAL #2   Title Stand 45 minutes with pain not > 3-4/10.   Time 8   Period Weeks   Status On-going               Plan - 02/05/16 1116    Clinical Impression Statement Pt did great again with Rx today. She feels that she is able to stand longer now to get ADLs done. She had notable  tightness and soreness in RT side QL and along RT side lumbar paras found during STW.  She did well again with pelvic traction at 75#S  AND IS PROGRESSING TOWARDS GOALS.   Rehab Potential Good   Clinical Impairments Affecting Rehab Potential Ongoing and progressing spinal pain over many years.   PT Frequency 2x / week   PT Duration 8 weeks   PT Next Visit Plan Continue with US/STW to low back as well as traction per MPT POC.   Consulted and Agree with Plan of Care Patient      Patient will benefit from skilled therapeutic intervention in order to improve the following deficits and impairments:  Decreased activity tolerance, Decreased range of motion, Pain  Visit Diagnosis: Chronic right-sided low back pain without sciatica  Pain in thoracic spine  Abnormal posture     Problem List Patient Active Problem List   Diagnosis Date Noted  . Osteopenia of the elderly 02/19/2014  . DDD (degenerative disc disease), lumbar 12/03/2013  . Scoliosis 12/03/2013  . Chronic LBP 05/14/2013  . IBS (irritable bowel syndrome) 11/08/2012  . UC (ulcerative colitis) ([place]) 04/23/2012  . Palpitation 10/26/2011  . Hypertension 10/26/2011  . Chronic ulcerative colitis ([place]) 03/23/2011    RAMSEUR,CHRIS,pta 02/05/2016, 11:29 AM  [facility] [address] [place], [place], [address] Phone: [phone]   Fax:  [phone]  Name: Lori Soto MRN: [id number] Date of Birth: Jan 22, 1936

## 2016-02-08 ENCOUNTER — Encounter: Payer: Self-pay | Admitting: *Deleted

## 2016-02-09 ENCOUNTER — Ambulatory Visit: Payer: Medicare Other | Admitting: Physical Therapy

## 2016-02-09 DIAGNOSIS — M546 Pain in thoracic spine: Secondary | ICD-10-CM | POA: Diagnosis not present

## 2016-02-09 DIAGNOSIS — G8929 Other chronic pain: Secondary | ICD-10-CM

## 2016-02-09 DIAGNOSIS — M545 Low back pain: Secondary | ICD-10-CM | POA: Diagnosis not present

## 2016-02-09 DIAGNOSIS — R293 Abnormal posture: Secondary | ICD-10-CM | POA: Diagnosis not present

## 2016-02-09 NOTE — Patient Instructions (Signed)
Flexion   Sitting on knees, fold body over legs and relax head and arms on floor. Extend arms as far forward as possible. Hold 20-30 seconds. Repeat reaching to the right with both hands and then to the left. Do 2 sessions per day.   Thoracic Self-Mobilization (Sitting)   With small rolled towel at lower ribs level, gently lean back until stretch is felt. Hold _30___ seconds. Relax. Repeat _3___ times per set. Do ____ sets per session. Do __2__ sessions per day.   Thoracic Self-Mobilization Stretch (Supine)   With small rolled towel at lower ribs level, gently lie back until stretch is felt. Hold _1-5 min. Relax.  Do __1-2__ sessions per day.  Madelyn Flavors, PT 02/09/16 3:22 PM [facility] [address] [place], [place], [address] Phone: [phone]   Fax:  [phone]

## 2016-02-09 NOTE — Therapy (Signed)
[facility] [place], [place], [address] Phone: [phone]   Fax:  [phone]  Physical Therapy Treatment  Patient Details  Name: Lori Soto MRN: [id number] Date of Birth: Sep 27, 1935 Referring Provider: Redge Gainer MD  Encounter Date: 02/09/2016      PT End of Session - 02/09/16 1431    Visit Number 17   Number of Visits 24   Date for PT Re-Evaluation 03/23/16   PT Start Time 1432   PT Stop Time 1535   PT Time Calculation (min) 63 min   Activity Tolerance Patient tolerated treatment well   Behavior During Therapy [facility] for tasks assessed/performed      Past Medical History:  Diagnosis Date  . Anemia   . Cancer ([place])    skin cancer on nose  . Carpal tunnel syndrome, bilateral   . Cataract   . Esophagitis   . GERD (gastroesophageal reflux disease)   . H/O hiatal hernia   . Osteopenia   . Scoliosis   . Shingles   . Shingles   . Ulcerative colitis     Past Surgical History:  Procedure Laterality Date  . ABDOMINAL HYSTERECTOMY    . BUNIONECTOMY     Bilateral  . CARPAL TUNNEL RELEASE Left 03/10/2015   Procedure: LEFT CARPAL TUNNEL RELEASE;  Surgeon: Daryll Brod, MD;  Location: [place];  Service: Orthopedics;  Laterality: Left;  . CATARACT EXTRACTION W/PHACO  05/28/2012   Procedure: CATARACT EXTRACTION PHACO AND INTRAOCULAR LENS PLACEMENT (IOC);  Surgeon: Tonny Branch, MD;  [facility];  Service: Ophthalmology;  Laterality: Right;  CDE=12.84  . CATARACT EXTRACTION W/PHACO  06/07/2012   Procedure: CATARACT EXTRACTION PHACO AND INTRAOCULAR LENS PLACEMENT (IOC);  Surgeon: Tonny Branch, MD;  [facility];  Service: Ophthalmology;  Laterality: Left;  CDE 17.60  . COLONOSCOPY    . COLONOSCOPY N/A 07/08/2015   Procedure: COLONOSCOPY;  Surgeon: Rogene Houston, MD;  [facility];  Service: Endoscopy;  Laterality: N/A;  930  . ESOPHAGEAL DILATION N/A 07/08/2015   Procedure: ESOPHAGEAL  DILATION;  Surgeon: Rogene Houston, MD;  [facility];  Service: Endoscopy;  Laterality: N/A;  . ESOPHAGOGASTRODUODENOSCOPY N/A 07/08/2015   Procedure: ESOPHAGOGASTRODUODENOSCOPY (EGD);  Surgeon: Rogene Houston, MD;  [facility];  Service: Endoscopy;  Laterality: N/A;  . Hernia     Right inguinal  . HERNIA REPAIR  1961   right inguinal hernia  . UPPER GASTROINTESTINAL ENDOSCOPY      There were no vitals filed for this visit.      Subjective Assessment - 02/09/16 1432    Subjective It's always at least a 7/10. I can stand up now and make a whole cake without having to take a break.   Limitations Standing   How long can you stand comfortably? 20 minutes.   Currently in Pain? Yes   Pain Score 7    Pain Location Back   Pain Orientation Lower   Pain Descriptors / Indicators Tightness   Pain Type Chronic pain   Pain Onset More than a month ago                         [facility] Adult PT Treatment/Exercise - 02/09/16 0001      Self-Care   Self-Care Other Self-Care Comments   Other Self-Care Comments  discussed Dry Needling with pt and benefits it may provide to her.  Lumbar Exercises: Stretches   Quadruped Mid Back Stretch 4 reps;20 seconds  seated straight and to left and right in chair to plinth     Lumbar Exercises: Standing   Other Standing Lumbar Exercises seated and supine thoracic extension demonstrated and discussed with pt including varying arm positions and thicknes of towel roll   Other Standing Lumbar Exercises also reviewed seated Side stretches     Modalities   Modalities Ultrasound;Traction     Ultrasound   Ultrasound Location R glut med, QL and B lumbar paraspinals   Ultrasound Parameters 1.5 w/cm2 1 mhz cont x 10 min   Ultrasound Goals Pain     Traction   Type of Traction Lumbar   Min (lbs) 5   Max (lbs) 75   Hold Time 99   Rest Time 5   Time 15     Manual Therapy   Manual Therapy Soft tissue mobilization    Soft tissue mobilization STW and TPR  to R Glute med and QL to decrease pain and tightness                PT Education - 02/09/16 1531    Education provided Yes   Education Details HEP and DN education   Person(s) Educated Patient   Methods Explanation;Demonstration;Handout;Tactile cues;Verbal cues   Comprehension Verbalized understanding;Returned demonstration          PT Short Term Goals - 12/23/15 1222      PT SHORT TERM GOAL #1   Title Ind with a HEP.   Period Weeks   Status Achieved  12/23/15           PT Long Term Goals - 02/09/16 1538      PT LONG TERM GOAL #1   Title Perform ADL's with pain not > 3/10.   Baseline 7/10 as of 02/09/16   Time 8   Period Weeks   Status On-going     PT LONG TERM GOAL #2   Title Stand 45 minutes with pain not > 3-4/10.   Baseline able to make a pound cake without resting 7/10 pain   Time 8   Period Weeks   Status On-going               Plan - 02/09/16 1532    Clinical Impression Statement Patient feels she is improving overall. She has a longer standing endurance to perform ADLS and is able to stand straighter, however, she states her pain is always about a 7/10. She continues to have marked tightness and trigger points of R QL, lumbar paraspinals and glut med. Patient may benefit from dry needling to decrease pain and improve function. LTGs are ongoing.   Rehab Potential Good   Clinical Impairments Affecting Rehab Potential Ongoing and progressing spinal pain over many years.   PT Frequency 2x / week   PT Duration 8 weeks   PT Treatment/Interventions ADLs/Self Care Home Management;Cryotherapy;Electrical Stimulation;Ultrasound;Traction;Moist Heat;Therapeutic activities;Therapeutic exercise;Dry needling;Patient/family education;Manual techniques   PT Next Visit Plan Dry Needling if approved, Continue with US/STW to low back as well as traction per MPT POC.   PT Home Exercise Plan childs pose seated in chair;  thoracic extension   Consulted and Agree with Plan of Care Patient      Patient will benefit from skilled therapeutic intervention in order to improve the following deficits and impairments:  Decreased activity tolerance, Decreased range of motion, Pain  Visit Diagnosis: Chronic right-sided low back pain without sciatica - Plan: PT plan  of care cert/re-cert     Problem List Patient Active Problem List   Diagnosis Date Noted  . Osteopenia of the elderly 02/19/2014  . DDD (degenerative disc disease), lumbar 12/03/2013  . Scoliosis 12/03/2013  . Chronic LBP 05/14/2013  . IBS (irritable bowel syndrome) 11/08/2012  . UC (ulcerative colitis) ([place]) 04/23/2012  . Palpitation 10/26/2011  . Hypertension 10/26/2011  . Chronic ulcerative colitis ([place]) 03/23/2011    Madelyn Flavors PT 02/09/2016, 3:44 PM  [facility] [address] [place], [place], [address] Phone: [phone]   Fax:  [phone]  Name: Saesha Llerenas MRN: [id number] Date of Birth: [birth year]

## 2016-02-12 ENCOUNTER — Ambulatory Visit: Payer: Medicare Other | Admitting: Physical Therapy

## 2016-02-12 DIAGNOSIS — M545 Low back pain, unspecified: Secondary | ICD-10-CM

## 2016-02-12 DIAGNOSIS — R293 Abnormal posture: Secondary | ICD-10-CM

## 2016-02-12 DIAGNOSIS — M546 Pain in thoracic spine: Secondary | ICD-10-CM

## 2016-02-12 DIAGNOSIS — G8929 Other chronic pain: Secondary | ICD-10-CM

## 2016-02-12 NOTE — Therapy (Signed)
[facility] [place], [place], [address] Phone: [phone]   Fax:  [phone]  Physical Therapy Treatment  Patient Details  Name: Lori Soto MRN: [id number] Date of Birth: 05/28/1935 Referring Provider: Redge Gainer MD  Encounter Date: 02/12/2016      PT End of Session - 02/12/16 1245    Visit Number 18   Number of Visits 24   Date for PT Re-Evaluation 03/23/16   PT Start Time 1120   PT Stop Time 1211   PT Time Calculation (min) 51 min   Activity Tolerance Patient tolerated treatment well   Behavior During Therapy [facility] for tasks assessed/performed      Past Medical History:  Diagnosis Date  . Anemia   . Cancer ([place])    skin cancer on nose  . Carpal tunnel syndrome, bilateral   . Cataract   . Esophagitis   . GERD (gastroesophageal reflux disease)   . H/O hiatal hernia   . Osteopenia   . Scoliosis   . Shingles   . Shingles   . Ulcerative colitis     Past Surgical History:  Procedure Laterality Date  . ABDOMINAL HYSTERECTOMY    . BUNIONECTOMY     Bilateral  . CARPAL TUNNEL RELEASE Left 03/10/2015   Procedure: LEFT CARPAL TUNNEL RELEASE;  Surgeon: Daryll Brod, MD;  Location: [place];  Service: Orthopedics;  Laterality: Left;  . CATARACT EXTRACTION W/PHACO  05/28/2012   Procedure: CATARACT EXTRACTION PHACO AND INTRAOCULAR LENS PLACEMENT (IOC);  Surgeon: Tonny Branch, MD;  [facility];  Service: Ophthalmology;  Laterality: Right;  CDE=12.84  . CATARACT EXTRACTION W/PHACO  06/07/2012   Procedure: CATARACT EXTRACTION PHACO AND INTRAOCULAR LENS PLACEMENT (IOC);  Surgeon: Tonny Branch, MD;  [facility];  Service: Ophthalmology;  Laterality: Left;  CDE 17.60  . COLONOSCOPY    . COLONOSCOPY N/A 07/08/2015   Procedure: COLONOSCOPY;  Surgeon: Rogene Houston, MD;  [facility];  Service: Endoscopy;  Laterality: N/A;  930  . ESOPHAGEAL DILATION N/A 07/08/2015   Procedure: ESOPHAGEAL  DILATION;  Surgeon: Rogene Houston, MD;  [facility];  Service: Endoscopy;  Laterality: N/A;  . ESOPHAGOGASTRODUODENOSCOPY N/A 07/08/2015   Procedure: ESOPHAGOGASTRODUODENOSCOPY (EGD);  Surgeon: Rogene Houston, MD;  [facility];  Service: Endoscopy;  Laterality: N/A;  . Hernia     Right inguinal  . HERNIA REPAIR  1961   right inguinal hernia  . UPPER GASTROINTESTINAL ENDOSCOPY      There were no vitals filed for this visit.      Subjective Assessment - 02/12/16 1246    Subjective Doing okay today.   Pain Score 6    Pain Location Back   Pain Orientation Lower   Pain Descriptors / Indicators Tightness   Pain Onset More than a month ago     Treatment:  Prone over pillow for STW/M and gentle mobs x 23 minutes f/b intermittent traction at 80# (99 sec on and 5 sec off) x 15 minutes.  Patient did well with treatment today.                              PT Short Term Goals - 12/23/15 1222      PT SHORT TERM GOAL #1   Title Ind with a HEP.   Period Weeks   Status Achieved  12/23/15  PT Long Term Goals - 02/09/16 1538      PT LONG TERM GOAL #1   Title Perform ADL's with pain not > 3/10.   Baseline 7/10 as of 02/09/16   Time 8   Period Weeks   Status On-going     PT LONG TERM GOAL #2   Title Stand 45 minutes with pain not > 3-4/10.   Baseline able to make a pound cake without resting 7/10 pain   Time 8   Period Weeks   Status On-going             Patient will benefit from skilled therapeutic intervention in order to improve the following deficits and impairments:  Decreased activity tolerance, Decreased range of motion, Pain  Visit Diagnosis: Chronic right-sided low back pain without sciatica  Pain in thoracic spine  Abnormal posture     Problem List Patient Active Problem List   Diagnosis Date Noted  . Osteopenia of the elderly 02/19/2014  . DDD (degenerative disc disease), lumbar 12/03/2013   . Scoliosis 12/03/2013  . Chronic LBP 05/14/2013  . IBS (irritable bowel syndrome) 11/08/2012  . UC (ulcerative colitis) ([place]) 04/23/2012  . Palpitation 10/26/2011  . Hypertension 10/26/2011  . Chronic ulcerative colitis ([place]) 03/23/2011    Nitish Roes, [place] MPT 02/12/2016, 12:50 PM  [facility] [address] [place], [place], [address] Phone: [phone]   Fax:  [phone]  Name: Lori Soto MRN: [id number] Date of Birth: [birth year]

## 2016-02-16 ENCOUNTER — Ambulatory Visit: Payer: Medicare Other | Admitting: Physical Therapy

## 2016-02-16 DIAGNOSIS — R293 Abnormal posture: Secondary | ICD-10-CM

## 2016-02-16 DIAGNOSIS — G8929 Other chronic pain: Secondary | ICD-10-CM

## 2016-02-16 DIAGNOSIS — M546 Pain in thoracic spine: Secondary | ICD-10-CM

## 2016-02-16 DIAGNOSIS — M545 Low back pain: Secondary | ICD-10-CM | POA: Diagnosis not present

## 2016-02-16 NOTE — Therapy (Signed)
[facility] [place], [place], [address] Phone: [phone]   Fax:  [phone]  Physical Therapy Treatment  Patient Details  Name: Lori Soto MRN: [id number] Date of Birth: [birth year] Referring Provider: Redge Gainer MD  Encounter Date: 02/16/2016      PT End of Session - 02/16/16 1602    Visit Number 19   Number of Visits 24   Date for PT Re-Evaluation 03/23/16   PT Start Time 0148   PT Stop Time 0236   PT Time Calculation (min) 48 min   Activity Tolerance Patient tolerated treatment well   Behavior During Therapy [facility] for tasks assessed/performed      Past Medical History:  Diagnosis Date  . Anemia   . Cancer ([place])    skin cancer on nose  . Carpal tunnel syndrome, bilateral   . Cataract   . Esophagitis   . GERD (gastroesophageal reflux disease)   . H/O hiatal hernia   . Osteopenia   . Scoliosis   . Shingles   . Shingles   . Ulcerative colitis     Past Surgical History:  Procedure Laterality Date  . ABDOMINAL HYSTERECTOMY    . BUNIONECTOMY     Bilateral  . CARPAL TUNNEL RELEASE Left 03/10/2015   Procedure: LEFT CARPAL TUNNEL RELEASE;  Surgeon: Daryll Brod, MD;  Location: [place];  Service: Orthopedics;  Laterality: Left;  . CATARACT EXTRACTION W/PHACO  05/28/2012   Procedure: CATARACT EXTRACTION PHACO AND INTRAOCULAR LENS PLACEMENT (IOC);  Surgeon: Tonny Branch, MD;  [facility];  Service: Ophthalmology;  Laterality: Right;  CDE=12.84  . CATARACT EXTRACTION W/PHACO  06/07/2012   Procedure: CATARACT EXTRACTION PHACO AND INTRAOCULAR LENS PLACEMENT (IOC);  Surgeon: Tonny Branch, MD;  [facility];  Service: Ophthalmology;  Laterality: Left;  CDE 17.60  . COLONOSCOPY    . COLONOSCOPY N/A 07/08/2015   Procedure: COLONOSCOPY;  Surgeon: Rogene Houston, MD;  [facility];  Service: Endoscopy;  Laterality: N/A;  930  . ESOPHAGEAL DILATION N/A 07/08/2015   Procedure: ESOPHAGEAL  DILATION;  Surgeon: Rogene Houston, MD;  [facility];  Service: Endoscopy;  Laterality: N/A;  . ESOPHAGOGASTRODUODENOSCOPY N/A 07/08/2015   Procedure: ESOPHAGOGASTRODUODENOSCOPY (EGD);  Surgeon: Rogene Houston, MD;  [facility];  Service: Endoscopy;  Laterality: N/A;  . Hernia     Right inguinal  . HERNIA REPAIR  1961   right inguinal hernia  . UPPER GASTROINTESTINAL ENDOSCOPY      There were no vitals filed for this visit.      Subjective Assessment - 02/16/16 1603    Subjective My back has actually done real well today.   Pain Score 4    Pain Location Back   Pain Orientation Lower   Pain Descriptors / Indicators Tightness   Pain Type Chronic pain   Pain Onset More than a month ago    Treatment:  In prone STW/M to affected right low back musculature including right QL release x 23 minutes f/b Int traction (99 sec on and 5 sec off) at 80# x 15 minutes.  Excellent job today.                               PT Short Term Goals - 12/23/15 1222      PT SHORT TERM GOAL #1   Title Ind with a HEP.   Period Weeks  Status Achieved  12/23/15           PT Long Term Goals - 02/09/16 1538      PT LONG TERM GOAL #1   Title Perform ADL's with pain not > 3/10.   Baseline 7/10 as of 02/09/16   Time 8   Period Weeks   Status On-going     PT LONG TERM GOAL #2   Title Stand 45 minutes with pain not > 3-4/10.   Baseline able to make a pound cake without resting 7/10 pain   Time 8   Period Weeks   Status On-going             Patient will benefit from skilled therapeutic intervention in order to improve the following deficits and impairments:  Decreased activity tolerance, Decreased range of motion, Pain  Visit Diagnosis: Chronic right-sided low back pain without sciatica  Pain in thoracic spine  Abnormal posture     Problem List Patient Active Problem List   Diagnosis Date Noted  . Osteopenia of the elderly  02/19/2014  . DDD (degenerative disc disease), lumbar 12/03/2013  . Scoliosis 12/03/2013  . Chronic LBP 05/14/2013  . IBS (irritable bowel syndrome) 11/08/2012  . UC (ulcerative colitis) ([place]) 04/23/2012  . Palpitation 10/26/2011  . Hypertension 10/26/2011  . Chronic ulcerative colitis ([place]) 03/23/2011    Hailly Fess, [place]  MPT 02/16/2016, 4:08 PM  [facility] [address] [place], [place], [address] Phone: [phone]   Fax:  [phone]  Name: Lori Soto MRN: [id number] Date of Birth: 02-Nov-1935

## 2016-02-17 DIAGNOSIS — M1811 Unilateral primary osteoarthritis of first carpometacarpal joint, right hand: Secondary | ICD-10-CM | POA: Diagnosis not present

## 2016-02-17 DIAGNOSIS — G5601 Carpal tunnel syndrome, right upper limb: Secondary | ICD-10-CM | POA: Diagnosis not present

## 2016-02-17 DIAGNOSIS — M1812 Unilateral primary osteoarthritis of first carpometacarpal joint, left hand: Secondary | ICD-10-CM | POA: Diagnosis not present

## 2016-02-17 DIAGNOSIS — G5602 Carpal tunnel syndrome, left upper limb: Secondary | ICD-10-CM | POA: Diagnosis not present

## 2016-02-19 ENCOUNTER — Ambulatory Visit: Payer: Medicare Other | Admitting: *Deleted

## 2016-02-19 DIAGNOSIS — M546 Pain in thoracic spine: Secondary | ICD-10-CM

## 2016-02-19 DIAGNOSIS — M545 Low back pain, unspecified: Secondary | ICD-10-CM

## 2016-02-19 DIAGNOSIS — G8929 Other chronic pain: Secondary | ICD-10-CM | POA: Diagnosis not present

## 2016-02-19 DIAGNOSIS — R293 Abnormal posture: Secondary | ICD-10-CM | POA: Diagnosis not present

## 2016-02-19 NOTE — Therapy (Signed)
[facility] [place], [place], [address] Phone: [phone]   Fax:  [phone]  Physical Therapy Treatment  Patient Details  Name: Lori Soto MRN: [id number] Date of Birth: [birth year] Referring Provider: Redge Gainer MD  Encounter Date: 02/19/2016      PT End of Session - 02/19/16 0904    Visit Number 20   Number of Visits 24   Date for PT Re-Evaluation 03/23/16   Authorization Type total    PT Start Time 0900   PT Stop Time 0951   PT Time Calculation (min) 51 min      Past Medical History:  Diagnosis Date  . Anemia   . Cancer ([place])    skin cancer on nose  . Carpal tunnel syndrome, bilateral   . Cataract   . Esophagitis   . GERD (gastroesophageal reflux disease)   . H/O hiatal hernia   . Osteopenia   . Scoliosis   . Shingles   . Shingles   . Ulcerative colitis     Past Surgical History:  Procedure Laterality Date  . ABDOMINAL HYSTERECTOMY    . BUNIONECTOMY     Bilateral  . CARPAL TUNNEL RELEASE Left 03/10/2015   Procedure: LEFT CARPAL TUNNEL RELEASE;  Surgeon: Daryll Brod, MD;  Location: [place];  Service: Orthopedics;  Laterality: Left;  . CATARACT EXTRACTION W/PHACO  05/28/2012   Procedure: CATARACT EXTRACTION PHACO AND INTRAOCULAR LENS PLACEMENT (IOC);  Surgeon: Tonny Branch, MD;  [facility];  Service: Ophthalmology;  Laterality: Right;  CDE=12.84  . CATARACT EXTRACTION W/PHACO  06/07/2012   Procedure: CATARACT EXTRACTION PHACO AND INTRAOCULAR LENS PLACEMENT (IOC);  Surgeon: Tonny Branch, MD;  [facility]: [facility];  Service: Ophthalmology;  Laterality: Left;  CDE 17.60  . COLONOSCOPY    . COLONOSCOPY N/A 07/08/2015   Procedure: COLONOSCOPY;  Surgeon: Rogene Houston, MD;  [facility];  Service: Endoscopy;  Laterality: N/A;  930  . ESOPHAGEAL DILATION N/A 07/08/2015   Procedure: ESOPHAGEAL DILATION;  Surgeon: Rogene Houston, MD;  [facility];  Service:  Endoscopy;  Laterality: N/A;  . ESOPHAGOGASTRODUODENOSCOPY N/A 07/08/2015   Procedure: ESOPHAGOGASTRODUODENOSCOPY (EGD);  Surgeon: Rogene Houston, MD;  [facility];  Service: Endoscopy;  Laterality: N/A;  . Hernia     Right inguinal  . HERNIA REPAIR  1961   right inguinal hernia  . UPPER GASTROINTESTINAL ENDOSCOPY      There were no vitals filed for this visit.      Subjective Assessment - 02/19/16 0903    Subjective My back has actually done real well today.   Limitations Standing   How long can you stand comfortably? 20 minutes.                         [facility] Adult PT Treatment/Exercise - 02/19/16 0001      Ultrasound   Ultrasound Location RT SIJ and LB paras   Ultrasound Parameters 1.5 w/cm2 x10 mins   Ultrasound Goals Pain     Traction   Type of Traction Lumbar   Min (lbs) 5   Max (lbs) 80   Hold Time 99   Rest Time 5   Time 15     Manual Therapy   Manual Therapy Soft tissue mobilization   Soft tissue mobilization STW and TPR  to R Glute med and QL to decrease pain and tightness  PT Short Term Goals - 12/23/15 1222      PT SHORT TERM GOAL #1   Title Ind with a HEP.   Period Weeks   Status Achieved  12/23/15           PT Long Term Goals - 02/09/16 1538      PT LONG TERM GOAL #1   Title Perform ADL's with pain not > 3/10.   Baseline 7/10 as of 02/09/16   Time 8   Period Weeks   Status On-going     PT LONG TERM GOAL #2   Title Stand 45 minutes with pain not > 3-4/10.   Baseline able to make a pound cake without resting 7/10 pain   Time 8   Period Weeks   Status On-going               Plan - 02/19/16 0946    Clinical Impression Statement Pt did fairly well with Rx today and continues to progress with Rxs. She is able to stand longer now and perform ADLs. She used to have to sit after about 15 mins and now she can stand for 30 and on a good day 45 mins before having to sit. Her FOTO was    Rehab Potential Good   Clinical Impairments Affecting Rehab Potential Ongoing and progressing spinal pain over many years.   PT Frequency 2x / week   PT Duration 8 weeks   PT Treatment/Interventions ADLs/Self Care Home Management;Cryotherapy;Electrical Stimulation;Ultrasound;Traction;Moist Heat;Therapeutic activities;Therapeutic exercise;Dry needling;Patient/family education;Manual techniques   PT Next Visit Plan Dry Needling if approved, Continue with US/STW to low back as well as traction per MPT POC.   PT Home Exercise Plan childs pose seated in chair; thoracic extension   Consulted and Agree with Plan of Care Patient      Patient will benefit from skilled therapeutic intervention in order to improve the following deficits and impairments:  Decreased activity tolerance, Decreased range of motion, Pain  Visit Diagnosis: Chronic right-sided low back pain without sciatica  Pain in thoracic spine  Abnormal posture     Problem List Patient Active Problem List   Diagnosis Date Noted  . Osteopenia of the elderly 02/19/2014  . DDD (degenerative disc disease), lumbar 12/03/2013  . Scoliosis 12/03/2013  . Chronic LBP 05/14/2013  . IBS (irritable bowel syndrome) 11/08/2012  . UC (ulcerative colitis) ([place]) 04/23/2012  . Palpitation 10/26/2011  . Hypertension 10/26/2011  . Chronic ulcerative colitis ([place]) 03/23/2011    RAMSEUR,CHRIS, PTA 02/19/2016, 12:57 PM  [facility] [place], [place], [address] Phone: [phone]   Fax:  [phone]  Name: Lori Soto MRN: [id number] Date of Birth: 21-Oct-1935

## 2016-02-23 ENCOUNTER — Encounter: Payer: Self-pay | Admitting: Physical Therapy

## 2016-02-23 ENCOUNTER — Ambulatory Visit: Payer: Medicare Other | Admitting: Physical Therapy

## 2016-02-23 DIAGNOSIS — R293 Abnormal posture: Secondary | ICD-10-CM | POA: Diagnosis not present

## 2016-02-23 DIAGNOSIS — M546 Pain in thoracic spine: Secondary | ICD-10-CM

## 2016-02-23 DIAGNOSIS — G8929 Other chronic pain: Secondary | ICD-10-CM | POA: Diagnosis not present

## 2016-02-23 DIAGNOSIS — M545 Low back pain, unspecified: Secondary | ICD-10-CM

## 2016-02-23 NOTE — Therapy (Signed)
[facility] [place], [place], [address] Phone: [phone]   Fax:  [phone]  Physical Therapy Treatment  Patient Details  Name: Lori Soto MRN: [id number] Date of Birth: [birth year] Referring Provider: Redge Gainer MD  Encounter Date: 02/23/2016      PT End of Session - 02/23/16 1601    Visit Number 21   Number of Visits 24   Date for PT Re-Evaluation 03/23/16   PT Start Time 7681   PT Stop Time 1658   PT Time Calculation (min) 48 min   Activity Tolerance Patient tolerated treatment well   Behavior During Therapy [facility] for tasks assessed/performed      Past Medical History:  Diagnosis Date  . Anemia   . Cancer ([place])    skin cancer on nose  . Carpal tunnel syndrome, bilateral   . Cataract   . Esophagitis   . GERD (gastroesophageal reflux disease)   . H/O hiatal hernia   . Osteopenia   . Scoliosis   . Shingles   . Shingles   . Ulcerative colitis     Past Surgical History:  Procedure Laterality Date  . ABDOMINAL HYSTERECTOMY    . BUNIONECTOMY     Bilateral  . CARPAL TUNNEL RELEASE Left 03/10/2015   Procedure: LEFT CARPAL TUNNEL RELEASE;  Surgeon: Daryll Brod, MD;  Location: [place];  Service: Orthopedics;  Laterality: Left;  . CATARACT EXTRACTION W/PHACO  05/28/2012   Procedure: CATARACT EXTRACTION PHACO AND INTRAOCULAR LENS PLACEMENT (IOC);  Surgeon: Tonny Branch, MD;  [facility];  Service: Ophthalmology;  Laterality: Right;  CDE=12.84  . CATARACT EXTRACTION W/PHACO  06/07/2012   Procedure: CATARACT EXTRACTION PHACO AND INTRAOCULAR LENS PLACEMENT (IOC);  Surgeon: Tonny Branch, MD;  [facility]: [facility];  Service: Ophthalmology;  Laterality: Left;  CDE 17.60  . COLONOSCOPY    . COLONOSCOPY N/A 07/08/2015   Procedure: COLONOSCOPY;  Surgeon: Rogene Houston, MD;  [facility];  Service: Endoscopy;  Laterality: N/A;  930  . ESOPHAGEAL DILATION N/A 07/08/2015   Procedure: ESOPHAGEAL  DILATION;  Surgeon: Rogene Houston, MD;  [facility];  Service: Endoscopy;  Laterality: N/A;  . ESOPHAGOGASTRODUODENOSCOPY N/A 07/08/2015   Procedure: ESOPHAGOGASTRODUODENOSCOPY (EGD);  Surgeon: Rogene Houston, MD;  [facility];  Service: Endoscopy;  Laterality: N/A;  . Hernia     Right inguinal  . HERNIA REPAIR  1961   right inguinal hernia  . UPPER GASTROINTESTINAL ENDOSCOPY      There were no vitals filed for this visit.      Subjective Assessment - 02/23/16 1601    Subjective Reports that she is "not as good as in the mornings." Reports increased activity over the weekend at family function and having knee pain as well. Reports that she put ice on her knees but asked if ice would be okay for her back as well.   Limitations Standing   How long can you stand comfortably? 20 minutes.   Currently in Pain? Yes   Pain Score 10-Worst pain ever   Pain Location Back   Pain Orientation Lower;Right   Pain Descriptors / Indicators Discomfort   Pain Type Chronic pain   Pain Onset More than a month ago                         [facility] Adult PT Treatment/Exercise - 02/23/16 0001      Modalities  Modalities Ultrasound;Traction     Ultrasound   Ultrasound Location R lumbar paraspinals   Ultrasound Parameters 1.5 w/cm2, 100%, 1 mhz x10 min   Ultrasound Goals Pain     Traction   Type of Traction Lumbar   Min (lbs) 5   Max (lbs) 80   Hold Time 99   Rest Time 5   Time 15     Manual Therapy   Manual Therapy Soft tissue mobilization   Soft tissue mobilization STW to R lumbar paraspinals and QL to decrease pain                  PT Short Term Goals - 12/23/15 1222      PT SHORT TERM GOAL #1   Title Ind with a HEP.   Period Weeks   Status Achieved  12/23/15           PT Long Term Goals - 02/09/16 1538      PT LONG TERM GOAL #1   Title Perform ADL's with pain not > 3/10.   Baseline 7/10 as of 02/09/16   Time 8   Period  Weeks   Status On-going     PT LONG TERM GOAL #2   Title Stand 45 minutes with pain not > 3-4/10.   Baseline able to make a pound cake without resting 7/10 pain   Time 8   Period Weeks   Status On-going               Plan - 03/12/2016 1704    Clinical Impression Statement Patient tolerated today's treatment well although she arrived to treatment with increased R low back pain. Increased tightness and inflammation was noted in mid R lumbar paraspinals and QL region with patient noting increased sensitivity to manual therapy to R lumbar paraspinals. Mechanical lumbar traction maintained at 80#. Normal modalities response noted following removal of the modalities. Patient was educated that if she saw fit to put ice on her back she could for 10-15 minutes at a time.   Rehab Potential Good   Clinical Impairments Affecting Rehab Potential Ongoing and progressing spinal pain over many years.   PT Frequency 2x / week   PT Duration 8 weeks   PT Treatment/Interventions ADLs/Self Care Home Management;Cryotherapy;Electrical Stimulation;Ultrasound;Traction;Moist Heat;Therapeutic activities;Therapeutic exercise;Dry needling;Patient/family education;Manual techniques   PT Next Visit Plan Dry Needling if approved, Continue with US/STW to low back as well as traction per MPT POC.   PT Home Exercise Plan childs pose seated in chair; thoracic extension   Consulted and Agree with Plan of Care Patient      Patient will benefit from skilled therapeutic intervention in order to improve the following deficits and impairments:  Decreased activity tolerance, Decreased range of motion, Pain  Visit Diagnosis: Chronic right-sided low back pain without sciatica  Pain in thoracic spine  Abnormal posture       G-Codes - March 12, 2016 1213    Functional Assessment Tool Used 20th visit FOTO 58% limited  Taken on 02-19-16    Functional Limitation Mobility: Walking and moving around   Mobility: Walking and  Moving Around Current Status [phone]) At least 40 percent but less than 60 percent impaired, limited or restricted   Mobility: Walking and Moving Around Goal Status [phone]) At least 20 percent but less than 40 percent impaired, limited or restricted      Problem List Patient Active Problem List   Diagnosis Date Noted  . Osteopenia of the elderly 02/19/2014  . DDD (  degenerative disc disease), lumbar 12/03/2013  . Scoliosis 12/03/2013  . Chronic LBP 05/14/2013  . IBS (irritable bowel syndrome) 11/08/2012  . UC (ulcerative colitis) ([place]) 04/23/2012  . Palpitation 10/26/2011  . Hypertension 10/26/2011  . Chronic ulcerative colitis ([place]) 03/23/2011    Wynelle Fanny, PTA 02/23/2016, 5:51 PM  [facility] [address] [place], [place], [address] Phone: [phone]   Fax:  [phone]  Name: Lori Soto MRN: [id number] Date of Birth: 1935-07-11

## 2016-02-23 NOTE — Therapy (Signed)
[facility] [place], [place], [address] Phone: [phone]   Fax:  [phone]  Physical Therapy Treatment  Patient Details  Name: Lori Soto MRN: [id number] Date of Birth: 1936-04-20 Referring Provider: Redge Gainer MD  Encounter Date: 02/19/2016    Past Medical History:  Diagnosis Date  . Anemia   . Cancer ([place])    skin cancer on nose  . Carpal tunnel syndrome, bilateral   . Cataract   . Esophagitis   . GERD (gastroesophageal reflux disease)   . H/O hiatal hernia   . Osteopenia   . Scoliosis   . Shingles   . Shingles   . Ulcerative colitis     Past Surgical History:  Procedure Laterality Date  . ABDOMINAL HYSTERECTOMY    . BUNIONECTOMY     Bilateral  . CARPAL TUNNEL RELEASE Left 03/10/2015   Procedure: LEFT CARPAL TUNNEL RELEASE;  Surgeon: Daryll Brod, MD;  Location: [place];  Service: Orthopedics;  Laterality: Left;  . CATARACT EXTRACTION W/PHACO  05/28/2012   Procedure: CATARACT EXTRACTION PHACO AND INTRAOCULAR LENS PLACEMENT (IOC);  Surgeon: Tonny Branch, MD;  [facility];  Service: Ophthalmology;  Laterality: Right;  CDE=12.84  . CATARACT EXTRACTION W/PHACO  06/07/2012   Procedure: CATARACT EXTRACTION PHACO AND INTRAOCULAR LENS PLACEMENT (IOC);  Surgeon: Tonny Branch, MD;  [facility];  Service: Ophthalmology;  Laterality: Left;  CDE 17.60  . COLONOSCOPY    . COLONOSCOPY N/A 07/08/2015   Procedure: COLONOSCOPY;  Surgeon: Rogene Houston, MD;  [facility];  Service: Endoscopy;  Laterality: N/A;  930  . ESOPHAGEAL DILATION N/A 07/08/2015   Procedure: ESOPHAGEAL DILATION;  Surgeon: Rogene Houston, MD;  [facility];  Service: Endoscopy;  Laterality: N/A;  . ESOPHAGOGASTRODUODENOSCOPY N/A 07/08/2015   Procedure: ESOPHAGOGASTRODUODENOSCOPY (EGD);  Surgeon: Rogene Houston, MD;  [facility];  Service: Endoscopy;  Laterality: N/A;  . Hernia     Right  inguinal  . HERNIA REPAIR  1961   right inguinal hernia  . UPPER GASTROINTESTINAL ENDOSCOPY      There were no vitals filed for this visit.                                 PT Short Term Goals - 12/23/15 1222      PT SHORT TERM GOAL #1   Title Ind with a HEP.   Period Weeks   Status Achieved  12/23/15           PT Long Term Goals - 02/09/16 1538      PT LONG TERM GOAL #1   Title Perform ADL's with pain not > 3/10.   Baseline 7/10 as of 02/09/16   Time 8   Period Weeks   Status On-going     PT LONG TERM GOAL #2   Title Stand 45 minutes with pain not > 3-4/10.   Baseline able to make a pound cake without resting 7/10 pain   Time 8   Period Weeks   Status On-going             Patient will benefit from skilled therapeutic intervention in order to improve the following deficits and impairments:  Decreased activity tolerance, Decreased range of motion, Pain  Visit Diagnosis: Chronic right-sided low back pain without sciatica  Pain in thoracic spine  Abnormal posture  G-Codes - 02/23/16 1213    Functional Assessment Tool Used 20th visit FOTO 58% limited  Taken on 02-19-16       Problem List Patient Active Problem List   Diagnosis Date Noted  . Osteopenia of the elderly 02/19/2014  . DDD (degenerative disc disease), lumbar 12/03/2013  . Scoliosis 12/03/2013  . Chronic LBP 05/14/2013  . IBS (irritable bowel syndrome) 11/08/2012  . UC (ulcerative colitis) ([place]) 04/23/2012  . Palpitation 10/26/2011  . Hypertension 10/26/2011  . Chronic ulcerative colitis ([place]) 03/23/2011    RAMSEUR,CHRIS, PTA 02/23/2016, 12:16 PM  [facility] [place], [place], [address] Phone: [phone]   Fax:  [phone]  Name: [place] MRN: [id number] Date of Birth: [birth year]

## 2016-03-01 ENCOUNTER — Encounter: Payer: Medicare Other | Admitting: Physical Therapy

## 2016-03-04 ENCOUNTER — Ambulatory Visit: Payer: Medicare Other | Attending: Family Medicine | Admitting: *Deleted

## 2016-03-04 DIAGNOSIS — G8929 Other chronic pain: Secondary | ICD-10-CM | POA: Insufficient documentation

## 2016-03-04 DIAGNOSIS — M25561 Pain in right knee: Secondary | ICD-10-CM | POA: Diagnosis not present

## 2016-03-04 DIAGNOSIS — M546 Pain in thoracic spine: Secondary | ICD-10-CM | POA: Diagnosis not present

## 2016-03-04 DIAGNOSIS — R293 Abnormal posture: Secondary | ICD-10-CM | POA: Diagnosis not present

## 2016-03-04 DIAGNOSIS — M545 Low back pain: Secondary | ICD-10-CM | POA: Diagnosis not present

## 2016-03-04 DIAGNOSIS — M25562 Pain in left knee: Secondary | ICD-10-CM | POA: Insufficient documentation

## 2016-03-04 NOTE — Therapy (Signed)
[facility] [place], [place], [address] Phone: [phone]   Fax:  [phone]  Physical Therapy Treatment  Patient Details  Name: Lori Soto MRN: [id number] Date of Birth: 01-21-1936 Referring Provider: Redge Gainer MD  Encounter Date: 03/04/2016      PT End of Session - 03/04/16 1124    Visit Number 22   Number of Visits 24   Date for PT Re-Evaluation 03/23/16   PT Start Time 1115   PT Stop Time 1201   PT Time Calculation (min) 46 min      Past Medical History:  Diagnosis Date  . Anemia   . Cancer ([place])    skin cancer on nose  . Carpal tunnel syndrome, bilateral   . Cataract   . Esophagitis   . GERD (gastroesophageal reflux disease)   . H/O hiatal hernia   . Osteopenia   . Scoliosis   . Shingles   . Shingles   . Ulcerative colitis     Past Surgical History:  Procedure Laterality Date  . ABDOMINAL HYSTERECTOMY    . BUNIONECTOMY     Bilateral  . CARPAL TUNNEL RELEASE Left 03/10/2015   Procedure: LEFT CARPAL TUNNEL RELEASE;  Surgeon: Daryll Brod, MD;  Location: [facility];  Service: Orthopedics;  Laterality: Left;  . CATARACT EXTRACTION W/PHACO  05/28/2012   Procedure: CATARACT EXTRACTION PHACO AND INTRAOCULAR LENS PLACEMENT (IOC);  Surgeon: Tonny Branch, MD;  [facility]: [facility];  Service: Ophthalmology;  Laterality: Right;  CDE=12.84  . CATARACT EXTRACTION W/PHACO  06/07/2012   Procedure: CATARACT EXTRACTION PHACO AND INTRAOCULAR LENS PLACEMENT (IOC);  Surgeon: Tonny Branch, MD;  [facility]: [facility];  Service: Ophthalmology;  Laterality: Left;  CDE 17.60  . COLONOSCOPY    . COLONOSCOPY N/A 07/08/2015   Procedure: COLONOSCOPY;  Surgeon: Rogene Houston, MD;  [facility];  Service: Endoscopy;  Laterality: N/A;  930  . ESOPHAGEAL DILATION N/A 07/08/2015   Procedure: ESOPHAGEAL DILATION;  Surgeon: Rogene Houston, MD;  [facility];  Service: Endoscopy;  Laterality: N/A;  .  ESOPHAGOGASTRODUODENOSCOPY N/A 07/08/2015   Procedure: ESOPHAGOGASTRODUODENOSCOPY (EGD);  Surgeon: Rogene Houston, MD;  [facility];  Service: Endoscopy;  Laterality: N/A;  . Hernia     Right inguinal  . HERNIA REPAIR  1961   right inguinal hernia  . UPPER GASTROINTESTINAL ENDOSCOPY      There were no vitals filed for this visit.                       [facility] Adult PT Treatment/Exercise - 03/04/16 0001      Modalities   Modalities Ultrasound;Traction     Ultrasound   Ultrasound Location RT LB paras   Ultrasound Parameters 1.5 w/cm2 x 10 mins   Ultrasound Goals Pain     Traction   Type of Traction Lumbar   Min (lbs) 5   Max (lbs) 80   Hold Time 99   Rest Time 5   Time 15     Manual Therapy   Manual Therapy Soft tissue mobilization   Soft tissue mobilization STW to R lumbar paraspinals and QL to decrease pain                  PT Short Term Goals - 12/23/15 1222      PT SHORT TERM GOAL #1   Title Ind with a HEP.   Period Weeks  Status Achieved  12/23/15           PT Long Term Goals - 02/09/16 1538      PT LONG TERM GOAL #1   Title Perform ADL's with pain not > 3/10.   Baseline 7/10 as of 02/09/16   Time 8   Period Weeks   Status On-going     PT LONG TERM GOAL #2   Title Stand 45 minutes with pain not > 3-4/10.   Baseline able to make a pound cake without resting 7/10 pain   Time 8   Period Weeks   Status On-going               Plan - 03/04/16 1125    Clinical Impression Statement Pt did great today with Rx. Her pain  was 7-8/10 apon arrival, but was 3-4/10 after traction. Traction was performed at 80#s again and tolerated well. Tight RT side LB paras and QL noted during STW.   Rehab Potential Good   PT Frequency 2x / week   PT Duration 8 weeks   PT Treatment/Interventions ADLs/Self Care Home Management;Cryotherapy;Electrical Stimulation;Ultrasound;Traction;Moist Heat;Therapeutic activities;Therapeutic  exercise;Dry needling;Patient/family education;Manual techniques   PT Next Visit Plan Dry Needling if approved, Continue with US/STW to low back as well as traction per MPT POC.   PT Home Exercise Plan childs pose seated in chair; thoracic extension   Consulted and Agree with Plan of Care Patient      Patient will benefit from skilled therapeutic intervention in order to improve the following deficits and impairments:  Decreased activity tolerance, Decreased range of motion, Pain  Visit Diagnosis: Chronic right-sided low back pain without sciatica  Pain in thoracic spine  Abnormal posture     Problem List Patient Active Problem List   Diagnosis Date Noted  . Osteopenia of the elderly 02/19/2014  . DDD (degenerative disc disease), lumbar 12/03/2013  . Scoliosis 12/03/2013  . Chronic LBP 05/14/2013  . IBS (irritable bowel syndrome) 11/08/2012  . UC (ulcerative colitis) ([place]) 04/23/2012  . Palpitation 10/26/2011  . Hypertension 10/26/2011  . Chronic ulcerative colitis ([place]) 03/23/2011    RAMSEUR,CHRIS, PTA 03/04/2016, 12:08 PM  [facility] [place], [place], [address] Phone: [phone]   Fax:  [phone]  Name: Lori Soto MRN: [id number] Date of Birth: 03-Jan-1936

## 2016-03-11 ENCOUNTER — Encounter: Payer: Self-pay | Admitting: Physical Therapy

## 2016-03-11 ENCOUNTER — Ambulatory Visit: Payer: Medicare Other | Admitting: Physical Therapy

## 2016-03-11 DIAGNOSIS — R293 Abnormal posture: Secondary | ICD-10-CM | POA: Diagnosis not present

## 2016-03-11 DIAGNOSIS — M545 Low back pain, unspecified: Secondary | ICD-10-CM

## 2016-03-11 DIAGNOSIS — M25562 Pain in left knee: Secondary | ICD-10-CM | POA: Diagnosis not present

## 2016-03-11 DIAGNOSIS — G8929 Other chronic pain: Secondary | ICD-10-CM

## 2016-03-11 DIAGNOSIS — M546 Pain in thoracic spine: Secondary | ICD-10-CM | POA: Diagnosis not present

## 2016-03-11 DIAGNOSIS — M25561 Pain in right knee: Secondary | ICD-10-CM | POA: Diagnosis not present

## 2016-03-11 NOTE — Therapy (Signed)
[facility] [place], [place], [address] Phone: [phone]   Fax:  [phone]  Physical Therapy Treatment  Patient Details  Name: Lori Soto MRN: [id number] Date of Birth: 1935/07/18 Referring Provider: Redge Gainer MD  Encounter Date: 03/11/2016      PT End of Session - 03/11/16 1114    Visit Number 23   Number of Visits 24   Date for PT Re-Evaluation 03/23/16   PT Start Time 1120   PT Stop Time 1207   PT Time Calculation (min) 47 min   Activity Tolerance Patient tolerated treatment well   Behavior During Therapy [facility] for tasks assessed/performed      Past Medical History:  Diagnosis Date  . Anemia   . Cancer ([place])    skin cancer on nose  . Carpal tunnel syndrome, bilateral   . Cataract   . Esophagitis   . GERD (gastroesophageal reflux disease)   . H/O hiatal hernia   . Osteopenia   . Scoliosis   . Shingles   . Shingles   . Ulcerative colitis     Past Surgical History:  Procedure Laterality Date  . ABDOMINAL HYSTERECTOMY    . BUNIONECTOMY     Bilateral  . CARPAL TUNNEL RELEASE Left 03/10/2015   Procedure: LEFT CARPAL TUNNEL RELEASE;  Surgeon: Daryll Brod, MD;  Location: [facility];  Service: Orthopedics;  Laterality: Left;  . CATARACT EXTRACTION W/PHACO  05/28/2012   Procedure: CATARACT EXTRACTION PHACO AND INTRAOCULAR LENS PLACEMENT (IOC);  Surgeon: Tonny Branch, MD;  [facility]: [facility];  Service: Ophthalmology;  Laterality: Right;  CDE=12.84  . CATARACT EXTRACTION W/PHACO  06/07/2012   Procedure: CATARACT EXTRACTION PHACO AND INTRAOCULAR LENS PLACEMENT (IOC);  Surgeon: Tonny Branch, MD;  [facility];  Service: Ophthalmology;  Laterality: Left;  CDE 17.60  . COLONOSCOPY    . COLONOSCOPY N/A 07/08/2015   Procedure: COLONOSCOPY;  Surgeon: Rogene Houston, MD;  [facility];  Service: Endoscopy;  Laterality: N/A;  930  . ESOPHAGEAL DILATION N/A 07/08/2015   Procedure: ESOPHAGEAL  DILATION;  Surgeon: Rogene Houston, MD;  [facility];  Service: Endoscopy;  Laterality: N/A;  . ESOPHAGOGASTRODUODENOSCOPY N/A 07/08/2015   Procedure: ESOPHAGOGASTRODUODENOSCOPY (EGD);  Surgeon: Rogene Houston, MD;  [facility];  Service: Endoscopy;  Laterality: N/A;  . Hernia     Right inguinal  . HERNIA REPAIR  1961   right inguinal hernia  . UPPER GASTROINTESTINAL ENDOSCOPY      There were no vitals filed for this visit.      Subjective Assessment - 03/11/16 1114    Subjective Has been standing most of the morning making bread.   Limitations Standing   How long can you stand comfortably? 20 minutes.   Currently in Pain? Yes   Pain Score 8    Pain Location Back   Pain Orientation Lower;Right   Pain Descriptors / Indicators Discomfort   Pain Type Chronic pain   Pain Onset More than a month ago                         [facility] Adult PT Treatment/Exercise - 03/11/16 0001      Modalities   Modalities Ultrasound;Traction     Ultrasound   Ultrasound Location R lumbar paraspinals   Ultrasound Parameters 1.5 w/cm2, 100%, 1 mhz x10 min   Ultrasound Goals Pain     Traction   Type  of Traction Lumbar   Min (lbs) 5   Max (lbs) 80   Hold Time 99   Rest Time 5   Time 15     Manual Therapy   Manual Therapy Soft tissue mobilization   Soft tissue mobilization STW to R lumbar paraspinals and QL to decrease pain                  PT Short Term Goals - 12/23/15 1222      PT SHORT TERM GOAL #1   Title Ind with a HEP.   Period Weeks   Status Achieved  12/23/15           PT Long Term Goals - 02/09/16 1538      PT LONG TERM GOAL #1   Title Perform ADL's with pain not > 3/10.   Baseline 7/10 as of 02/09/16   Time 8   Period Weeks   Status On-going     PT LONG TERM GOAL #2   Title Stand 45 minutes with pain not > 3-4/10.   Baseline able to make a pound cake without resting 7/10 pain   Time 8   Period Weeks   Status  On-going               Plan - 03/11/16 1201    Clinical Impression Statement Patient presented in clinic again with increased R low back pain after standing for most of the morning. Patient sensitve to manual therapy in musculature just superior to R iliac crest but able to tolerate manual therapy and overall treatment well. Normal modalities response noted and traction tolerated well at 80# again today.    Rehab Potential Good   Clinical Impairments Affecting Rehab Potential Ongoing and progressing spinal pain over many years.   PT Frequency 2x / week   PT Duration 8 weeks   PT Treatment/Interventions ADLs/Self Care Home Management;Cryotherapy;Electrical Stimulation;Ultrasound;Traction;Moist Heat;Therapeutic activities;Therapeutic exercise;Dry needling;Patient/family education;Manual techniques   PT Next Visit Plan D/C next treatment.   PT Home Exercise Plan childs pose seated in chair; thoracic extension   Consulted and Agree with Plan of Care Patient      Patient will benefit from skilled therapeutic intervention in order to improve the following deficits and impairments:  Decreased activity tolerance, Decreased range of motion, Pain  Visit Diagnosis: Chronic right-sided low back pain without sciatica  Pain in thoracic spine  Abnormal posture     Problem List Patient Active Problem List   Diagnosis Date Noted  . Osteopenia of the elderly 02/19/2014  . DDD (degenerative disc disease), lumbar 12/03/2013  . Scoliosis 12/03/2013  . Chronic LBP 05/14/2013  . IBS (irritable bowel syndrome) 11/08/2012  . UC (ulcerative colitis) ([place]) 04/23/2012  . Palpitation 10/26/2011  . Hypertension 10/26/2011  . Chronic ulcerative colitis ([place]) 03/23/2011    Wynelle Fanny, PTA 03/11/2016, 12:08 PM  [facility] [address] [place], [place], [address] Phone: [phone]   Fax:  [phone]  Name: Lori Soto MRN:  [id number] Date of Birth: August 24, 1935

## 2016-03-15 ENCOUNTER — Ambulatory Visit: Payer: Medicare Other | Admitting: Physical Therapy

## 2016-03-15 DIAGNOSIS — G8929 Other chronic pain: Secondary | ICD-10-CM | POA: Diagnosis not present

## 2016-03-15 DIAGNOSIS — M545 Low back pain, unspecified: Secondary | ICD-10-CM

## 2016-03-15 DIAGNOSIS — M25561 Pain in right knee: Secondary | ICD-10-CM | POA: Diagnosis not present

## 2016-03-15 DIAGNOSIS — R293 Abnormal posture: Secondary | ICD-10-CM | POA: Diagnosis not present

## 2016-03-15 DIAGNOSIS — M25562 Pain in left knee: Secondary | ICD-10-CM | POA: Diagnosis not present

## 2016-03-15 DIAGNOSIS — M546 Pain in thoracic spine: Secondary | ICD-10-CM | POA: Diagnosis not present

## 2016-03-15 NOTE — Therapy (Signed)
[facility] [place], [place], [address] Phone: [phone]   Fax:  [phone]  Physical Therapy Treatment  Patient Details  Name: Lori Soto MRN: [id number] Date of Birth: March 12, 1936 Referring Provider: Redge Gainer MD  Encounter Date: 03/15/2016      PT End of Session - 03/15/16 1038    Visit Number 24   Number of Visits 30   Date for PT Re-Evaluation 04/26/16   Authorization Type --   PT Start Time 1038   PT Stop Time 1136   PT Time Calculation (min) 58 min   Activity Tolerance Patient tolerated treatment well   Behavior During Therapy [facility] for tasks assessed/performed      Past Medical History:  Diagnosis Date  . Anemia   . Cancer ([place])    skin cancer on nose  . Carpal tunnel syndrome, bilateral   . Cataract   . Esophagitis   . GERD (gastroesophageal reflux disease)   . H/O hiatal hernia   . Osteopenia   . Scoliosis   . Shingles   . Shingles   . Ulcerative colitis     Past Surgical History:  Procedure Laterality Date  . ABDOMINAL HYSTERECTOMY    . BUNIONECTOMY     Bilateral  . CARPAL TUNNEL RELEASE Left 03/10/2015   Procedure: LEFT CARPAL TUNNEL RELEASE;  Surgeon: Daryll Brod, MD;  Location: [facility];  Service: Orthopedics;  Laterality: Left;  . CATARACT EXTRACTION W/PHACO  05/28/2012   Procedure: CATARACT EXTRACTION PHACO AND INTRAOCULAR LENS PLACEMENT (IOC);  Surgeon: Tonny Branch, MD;  [facility];  Service: Ophthalmology;  Laterality: Right;  CDE=12.84  . CATARACT EXTRACTION W/PHACO  06/07/2012   Procedure: CATARACT EXTRACTION PHACO AND INTRAOCULAR LENS PLACEMENT (IOC);  Surgeon: Tonny Branch, MD;  [facility];  Service: Ophthalmology;  Laterality: Left;  CDE 17.60  . COLONOSCOPY    . COLONOSCOPY N/A 07/08/2015   Procedure: COLONOSCOPY;  Surgeon: Rogene Houston, MD;  [facility];  Service: Endoscopy;  Laterality: N/A;  930  . ESOPHAGEAL DILATION N/A 07/08/2015    Procedure: ESOPHAGEAL DILATION;  Surgeon: Rogene Houston, MD;  [facility];  Service: Endoscopy;  Laterality: N/A;  . ESOPHAGOGASTRODUODENOSCOPY N/A 07/08/2015   Procedure: ESOPHAGOGASTRODUODENOSCOPY (EGD);  Surgeon: Rogene Houston, MD;  [facility];  Service: Endoscopy;  Laterality: N/A;  . Hernia     Right inguinal  . HERNIA REPAIR  1961   right inguinal hernia  . UPPER GASTROINTESTINAL ENDOSCOPY      There were no vitals filed for this visit.      Subjective Assessment - 03/15/16 1041    Subjective Patient reports the backs of her knees are hurting today. The back is not bad as it's early in the morning. The afternoon is usually worse.   Limitations Standing   How long can you stand comfortably? 45-60 minutes. as of 03/15/16 (but doesn't last if I stop traction)   Currently in Pain? Yes   Pain Score 6    Pain Location Back   Pain Orientation Right;Lower   Pain Descriptors / Indicators Discomfort   Pain Type Chronic pain   Pain Onset More than a month ago                         [facility] Adult PT Treatment/Exercise - 03/15/16 0001      Modalities   Modalities Moist Heat;Traction  Moist Heat Therapy   Number Minutes Moist Heat 15 Minutes   Moist Heat Location Lumbar Spine     Traction   Type of Traction Lumbar   Min (lbs) 5   Max (lbs) 80   Hold Time 99   Rest Time 5   Time 15     Manual Therapy   Manual Therapy Soft tissue mobilization   Soft tissue mobilization to B lumbar and gluteals          Trigger Point Dry Needling - 03/15/16 1230    Consent Given? Yes   Education Handout Provided Yes   Muscles Treated Upper Body Quadratus Lumborum  B   Muscles Treated Lower Body Gluteus minimus;Gluteus maximus;Piriformis   Gluteus Maximus Response Twitch response elicited;Palpable increased muscle length  B   Gluteus Minimus Response Twitch response elicited;Palpable increased muscle length  B   Piriformis Response  Twitch response elicited;Palpable increased muscle length  R              PT Education - 03/15/16 1231    Education provided Yes   Education Details Reviewed DN and aftercare   Person(s) Educated Patient   Methods Explanation;Handout   Comprehension Verbalized understanding          PT Short Term Goals - 12/23/15 1222      PT SHORT TERM GOAL #1   Title Ind with a HEP.   Period Weeks   Status Achieved  12/23/15           PT Long Term Goals - 03/15/16 1043      PT LONG TERM GOAL #1   Title Perform ADL's with pain not > 3/10.   Baseline 8/10 or more by lunch time   Time 8   Period Weeks   Status On-going     PT LONG TERM GOAL #2   Title Stand 45 minutes with pain not > 3-4/10.   Baseline able to make a pound cake without resting 7/10 pain   Time 8   Period Weeks   Status On-going               Plan - 03/15/16 1233    Clinical Impression Statement Patient presented today to clinic with continued c/o low back pain R >L. She states that her pain is not that bad since it is morning and she has not stood much yet. She reports that she has made improvements since her evaluation, but that without traction her pain returns to levels that significantly affect her ability to perform ADLS. PT performed trigger point dry needling today to address TPs in her low back and gluteals. Patient also c/o B knee pain which may benefit from DN as well. Goals are ongoing.   Rehab Potential Good   Clinical Impairments Affecting Rehab Potential Ongoing and progressing spinal pain over many years.   PT Frequency 2x / week   PT Duration 6 weeks   PT Treatment/Interventions ADLs/Self Care Home Management;Cryotherapy;Electrical Stimulation;Ultrasound;Traction;Moist Heat;Therapeutic activities;Therapeutic exercise;Dry needling;Patient/family education;Manual techniques   PT Next Visit Plan Assess DN and continue if indicated. Continue traction.       Patient will benefit from  skilled therapeutic intervention in order to improve the following deficits and impairments:  Decreased activity tolerance, Decreased range of motion, Pain  Visit Diagnosis: Chronic right-sided low back pain without sciatica - Plan: PT plan of care cert/re-cert  Chronic pain of left knee - Plan: PT plan of care cert/re-cert  Chronic pain of right knee -  Plan: PT plan of care cert/re-cert     Problem List Patient Active Problem List   Diagnosis Date Noted  . Osteopenia of the elderly 02/19/2014  . DDD (degenerative disc disease), lumbar 12/03/2013  . Scoliosis 12/03/2013  . Chronic LBP 05/14/2013  . IBS (irritable bowel syndrome) 11/08/2012  . UC (ulcerative colitis) ([place]) 04/23/2012  . Palpitation 10/26/2011  . Hypertension 10/26/2011  . Chronic ulcerative colitis ([place]) 03/23/2011    Madelyn Flavors PT 03/15/2016, 1:07 PM  [facility] [address] [place], [place], [address] Phone: [phone]   Fax:  [phone]  Name: Ambrosia Wisnewski MRN: [id number] Date of Birth: [birth year]

## 2016-03-15 NOTE — Patient Instructions (Signed)
Trigger Point Dry Needling  . What is Trigger Point Dry Needling (DN)? o DN is a physical therapy technique used to treat muscle pain and dysfunction. Specifically, DN helps deactivate muscle trigger points (muscle knots).  o A thin filiform needle is used to penetrate the skin and stimulate the underlying trigger point. The goal is for a local twitch response (LTR) to occur and for the trigger point to relax. No medication of any kind is injected during the procedure.   . What Does Trigger Point Dry Needling Feel Like?  o The procedure feels different for each individual patient. Some patients report that they do not actually feel the needle enter the skin and overall the process is not painful. Very mild bleeding may occur. However, many patients feel a deep cramping in the muscle in which the needle was inserted. This is the local twitch response.   [name] How Will I feel after the treatment? o Soreness is normal, and the onset of soreness may not occur for a few hours. Typically this soreness does not last longer than two days.  o Bruising is uncommon, however; ice can be used to decrease any possible bruising.  o In rare cases feeling tired or nauseous after the treatment is normal. In addition, your symptoms may get worse before they get better, this period will typically not last longer than 24 hours.   . What Can I do After My Treatment? o Increase your hydration by drinking more water for the next 24 hours. o You may place ice or heat on the areas treated that have become sore, however, do not use heat on inflamed or bruised areas. Heat often brings more relief post needling. o You can continue your regular activities, but vigorous activity is not recommended initially after the treatment for 24 hours. o DN is best combined with other physical therapy such as strengthening, stretching, and other therapies.    Precautions:  In some cases, dry needling is done over the lung field. While rare,  there is a risk of pneumothorax (punctured lung). Because of this, if you ever experience shortness of breath on exertion, difficulty taking a deep breath, chest pain or a dry cough following dry needling, you should report to an emergency room and tell them that you have been dry needled over the thorax.  Madelyn Flavors, PT 03/15/16 12:31 PM; [facility] [place], [place], [address] Phone: [phone]   Fax:  [phone]

## 2016-03-16 ENCOUNTER — Telehealth: Payer: Self-pay | Admitting: Family Medicine

## 2016-03-16 ENCOUNTER — Ambulatory Visit: Payer: Medicare Other | Admitting: Physical Therapy

## 2016-03-16 DIAGNOSIS — M25561 Pain in right knee: Secondary | ICD-10-CM

## 2016-03-16 NOTE — Telephone Encounter (Signed)
Patient states she wanted to see ortho when they come to [facility] for her right knee pain. Referral placed.

## 2016-03-17 ENCOUNTER — Other Ambulatory Visit: Payer: Self-pay | Admitting: Orthopedic Surgery

## 2016-03-17 ENCOUNTER — Encounter (INDEPENDENT_AMBULATORY_CARE_PROVIDER_SITE_OTHER): Payer: Medicare Other

## 2016-03-17 ENCOUNTER — Ambulatory Visit (INDEPENDENT_AMBULATORY_CARE_PROVIDER_SITE_OTHER): Payer: Medicare Other

## 2016-03-17 DIAGNOSIS — M25562 Pain in left knee: Secondary | ICD-10-CM

## 2016-03-17 DIAGNOSIS — M25561 Pain in right knee: Secondary | ICD-10-CM

## 2016-03-17 DIAGNOSIS — M1711 Unilateral primary osteoarthritis, right knee: Secondary | ICD-10-CM | POA: Diagnosis not present

## 2016-03-17 DIAGNOSIS — M170 Bilateral primary osteoarthritis of knee: Secondary | ICD-10-CM | POA: Diagnosis not present

## 2016-03-17 DIAGNOSIS — M1712 Unilateral primary osteoarthritis, left knee: Secondary | ICD-10-CM | POA: Diagnosis not present

## 2016-03-18 DIAGNOSIS — C44311 Basal cell carcinoma of skin of nose: Secondary | ICD-10-CM | POA: Diagnosis not present

## 2016-03-22 ENCOUNTER — Ambulatory Visit: Payer: Medicare Other | Admitting: Physical Therapy

## 2016-03-22 DIAGNOSIS — G8929 Other chronic pain: Secondary | ICD-10-CM | POA: Diagnosis not present

## 2016-03-22 DIAGNOSIS — M25562 Pain in left knee: Secondary | ICD-10-CM | POA: Diagnosis not present

## 2016-03-22 DIAGNOSIS — M545 Low back pain, unspecified: Secondary | ICD-10-CM

## 2016-03-22 DIAGNOSIS — M546 Pain in thoracic spine: Secondary | ICD-10-CM | POA: Diagnosis not present

## 2016-03-22 DIAGNOSIS — R293 Abnormal posture: Secondary | ICD-10-CM | POA: Diagnosis not present

## 2016-03-22 DIAGNOSIS — M25561 Pain in right knee: Secondary | ICD-10-CM | POA: Diagnosis not present

## 2016-03-22 NOTE — Therapy (Signed)
[facility] [place], [place], [address] Phone: [phone]   Fax:  [phone]  Physical Therapy Treatment  Patient Details  Name: Lori Soto MRN: [id number] Date of Birth: 09-14-1935 Referring Provider: Redge Gainer MD  Encounter Date: 03/22/2016      PT End of Session - 03/22/16 0955    Visit Number 25   Number of Visits 30   Date for PT Re-Evaluation 04/26/16   PT Start Time 0954   PT Stop Time 1047   PT Time Calculation (min) 53 min   Activity Tolerance Patient tolerated treatment well   Behavior During Therapy [facility] for tasks assessed/performed      Past Medical History:  Diagnosis Date  . Anemia   . Cancer ([place])    skin cancer on nose  . Carpal tunnel syndrome, bilateral   . Cataract   . Esophagitis   . GERD (gastroesophageal reflux disease)   . H/O hiatal hernia   . Osteopenia   . Scoliosis   . Shingles   . Shingles   . Ulcerative colitis     Past Surgical History:  Procedure Laterality Date  . ABDOMINAL HYSTERECTOMY    . BUNIONECTOMY     Bilateral  . CARPAL TUNNEL RELEASE Left 03/10/2015   Procedure: LEFT CARPAL TUNNEL RELEASE;  Surgeon: Daryll Brod, MD;  Location: [place];  Service: Orthopedics;  Laterality: Left;  . CATARACT EXTRACTION W/PHACO  05/28/2012   Procedure: CATARACT EXTRACTION PHACO AND INTRAOCULAR LENS PLACEMENT (IOC);  Surgeon: Tonny Branch, MD;  [facility];  Service: Ophthalmology;  Laterality: Right;  CDE=12.84  . CATARACT EXTRACTION W/PHACO  06/07/2012   Procedure: CATARACT EXTRACTION PHACO AND INTRAOCULAR LENS PLACEMENT (IOC);  Surgeon: Tonny Branch, MD;  [facility];  Service: Ophthalmology;  Laterality: Left;  CDE 17.60  . COLONOSCOPY    . COLONOSCOPY N/A 07/08/2015   Procedure: COLONOSCOPY;  Surgeon: Rogene Houston, MD;  [facility];  Service: Endoscopy;  Laterality: N/A;  930  . ESOPHAGEAL DILATION N/A 07/08/2015   Procedure: ESOPHAGEAL  DILATION;  Surgeon: Rogene Houston, MD;  [facility];  Service: Endoscopy;  Laterality: N/A;  . ESOPHAGOGASTRODUODENOSCOPY N/A 07/08/2015   Procedure: ESOPHAGOGASTRODUODENOSCOPY (EGD);  Surgeon: Rogene Houston, MD;  [facility];  Service: Endoscopy;  Laterality: N/A;  . Hernia     Right inguinal  . HERNIA REPAIR  1961   right inguinal hernia  . UPPER GASTROINTESTINAL ENDOSCOPY      There were no vitals filed for this visit.      Subjective Assessment - 03/22/16 0956    Subjective Patient reports that her back felt looser after the DN. She had increased R knee pain and swelling last week and received a prednisone injection.    Limitations Standing   How long can you stand comfortably? 45-60 minutes. as of 03/15/16 (but doesn't last if I stop traction)   Currently in Pain? Yes   Pain Score 5    Pain Location Back   Pain Orientation Right;Lower   Pain Descriptors / Indicators Discomfort   Pain Type Chronic pain   Pain Onset More than a month ago   Multiple Pain Sites Yes   Pain Score 8   Pain Location Knee   Pain Orientation Left  Rt knee had injection 03/17/16   Pain Descriptors / Indicators Sharp   Pain Type Chronic pain   Pain Onset More than a month ago  Pain Frequency Constant   Aggravating Factors  prolonged standing   Pain Relieving Factors rest and ice (sometimes                         [facility] Adult PT Treatment/Exercise - 03/22/16 0001      Modalities   Modalities Traction     Traction   Type of Traction Lumbar   Min (lbs) 5   Max (lbs) 80   Hold Time 99   Rest Time 5   Time 15     Manual Therapy   Manual Therapy Soft tissue mobilization   Soft tissue mobilization to L gastroc, HS and quad          Trigger Point Dry Needling - 03/22/16 1205    Consent Given? Yes   Education Handout Provided No   Muscles Treated Lower Body Gastrocnemius;Hamstring;Quadriceps   Quadriceps Response Twitch response  elicited;Palpable increased muscle length  L; distally   Hamstring Response Twitch response elicited;Palpable increased muscle length  L   Gastrocnemius Response Twitch response elicited;Palpable increased muscle length  L                PT Short Term Goals - 12/23/15 1222      PT SHORT TERM GOAL #1   Title Ind with a HEP.   Period Weeks   Status Achieved  12/23/15           PT Long Term Goals - 03/22/16 1208      PT LONG TERM GOAL #1   Title Perform ADL's with pain not > 3/10.   Baseline 8/10 or more by lunch time   Time 8   Period Weeks   Status On-going     PT LONG TERM GOAL #2   Title Stand 45 minutes with pain not > 3-4/10.   Baseline able to make a pound cake without resting 7/10 pain   Time 8   Period Weeks   Status On-going     PT LONG TERM GOAL #3   Title Patient to report decreased B knee pain by 25% with ADLS   Time 6   Period Weeks   Status New             Patient will benefit from skilled therapeutic intervention in order to improve the following deficits and impairments:     Visit Diagnosis: Chronic pain of left knee  Chronic right-sided low back pain without sciatica     Problem List Patient Active Problem List   Diagnosis Date Noted  . Osteopenia of the elderly 02/19/2014  . DDD (degenerative disc disease), lumbar 12/03/2013  . Scoliosis 12/03/2013  . Chronic LBP 05/14/2013  . IBS (irritable bowel syndrome) 11/08/2012  . UC (ulcerative colitis) ([place]) 04/23/2012  . Palpitation 10/26/2011  . Hypertension 10/26/2011  . Chronic ulcerative colitis ([place]) 03/23/2011    Madelyn Flavors PT 03/22/2016, 12:13 PM  [facility] [address] [place], [place], [address] Phone: [phone]   Fax:  [phone]  Name: Lori Soto MRN: [id number] Date of Birth: 22-Aug-1935

## 2016-03-29 ENCOUNTER — Other Ambulatory Visit: Payer: Self-pay | Admitting: Pharmacist

## 2016-03-29 DIAGNOSIS — M8588 Other specified disorders of bone density and structure, other site: Secondary | ICD-10-CM

## 2016-03-30 ENCOUNTER — Ambulatory Visit: Payer: Medicare Other | Admitting: Physical Therapy

## 2016-03-30 ENCOUNTER — Ambulatory Visit (INDEPENDENT_AMBULATORY_CARE_PROVIDER_SITE_OTHER): Payer: Medicare Other

## 2016-03-30 ENCOUNTER — Encounter: Payer: Self-pay | Admitting: Physical Therapy

## 2016-03-30 DIAGNOSIS — M546 Pain in thoracic spine: Secondary | ICD-10-CM | POA: Diagnosis not present

## 2016-03-30 DIAGNOSIS — M8588 Other specified disorders of bone density and structure, other site: Secondary | ICD-10-CM

## 2016-03-30 DIAGNOSIS — M545 Low back pain: Secondary | ICD-10-CM

## 2016-03-30 DIAGNOSIS — M25562 Pain in left knee: Secondary | ICD-10-CM | POA: Diagnosis not present

## 2016-03-30 DIAGNOSIS — M25561 Pain in right knee: Secondary | ICD-10-CM | POA: Diagnosis not present

## 2016-03-30 DIAGNOSIS — G8929 Other chronic pain: Secondary | ICD-10-CM

## 2016-03-30 DIAGNOSIS — R293 Abnormal posture: Secondary | ICD-10-CM | POA: Diagnosis not present

## 2016-03-30 NOTE — Therapy (Signed)
[facility] [place], [place], [address] Phone: [phone]   Fax:  [phone]  Physical Therapy Treatment  Patient Details  Name: Lori Soto MRN: [id number] Date of Birth: 15-Mar-1936 Referring Provider: Redge Gainer MD  Encounter Date: 03/30/2016      PT End of Session - 03/30/16 1121    Visit Number 26   Number of Visits 30   Date for PT Re-Evaluation 04/26/16   PT Start Time 1117   PT Stop Time 1159   PT Time Calculation (min) 42 min   Activity Tolerance Patient tolerated treatment well   Behavior During Therapy [facility] for tasks assessed/performed      Past Medical History:  Diagnosis Date  . Anemia   . Cancer ([place])    skin cancer on nose  . Carpal tunnel syndrome, bilateral   . Cataract   . Esophagitis   . GERD (gastroesophageal reflux disease)   . H/O hiatal hernia   . Osteopenia   . Scoliosis   . Shingles   . Shingles   . Ulcerative colitis     Past Surgical History:  Procedure Laterality Date  . ABDOMINAL HYSTERECTOMY    . BUNIONECTOMY     Bilateral  . CARPAL TUNNEL RELEASE Left 03/10/2015   Procedure: LEFT CARPAL TUNNEL RELEASE;  Surgeon: Daryll Brod, MD;  Location: [place];  Service: Orthopedics;  Laterality: Left;  . CATARACT EXTRACTION W/PHACO  05/28/2012   Procedure: CATARACT EXTRACTION PHACO AND INTRAOCULAR LENS PLACEMENT (IOC);  Surgeon: Tonny Branch, MD;  [facility];  Service: Ophthalmology;  Laterality: Right;  CDE=12.84  . CATARACT EXTRACTION W/PHACO  06/07/2012   Procedure: CATARACT EXTRACTION PHACO AND INTRAOCULAR LENS PLACEMENT (IOC);  Surgeon: Tonny Branch, MD;  [facility];  Service: Ophthalmology;  Laterality: Left;  CDE 17.60  . COLONOSCOPY    . COLONOSCOPY N/A 07/08/2015   Procedure: COLONOSCOPY;  Surgeon: Rogene Houston, MD;  [facility];  Service: Endoscopy;  Laterality: N/A;  930  . ESOPHAGEAL DILATION N/A 07/08/2015   Procedure: ESOPHAGEAL  DILATION;  Surgeon: Rogene Houston, MD;  [facility];  Service: Endoscopy;  Laterality: N/A;  . ESOPHAGOGASTRODUODENOSCOPY N/A 07/08/2015   Procedure: ESOPHAGOGASTRODUODENOSCOPY (EGD);  Surgeon: Rogene Houston, MD;  [facility];  Service: Endoscopy;  Laterality: N/A;  . Hernia     Right inguinal  . HERNIA REPAIR  1961   right inguinal hernia  . UPPER GASTROINTESTINAL ENDOSCOPY      There were no vitals filed for this visit.      Subjective Assessment - 03/30/16 1116    Subjective Patient reported some increased tighness and muscle spasm in right low back area   Limitations Standing   How long can you stand comfortably? 45-60 minutes. as of 03/15/16 (but doesn't last if I stop traction)   Currently in Pain? Yes   Pain Score 6    Pain Location Back   Pain Orientation Right;Lower   Pain Descriptors / Indicators Spasm;Tightness   Pain Type Chronic pain   Pain Onset More than a month ago   Pain Frequency Constant   Aggravating Factors  standing or prolong activity   Pain Relieving Factors at rest                         [facility] Adult PT Treatment/Exercise - 03/30/16 0001      Traction   Type of Traction  Lumbar   Min (lbs) 5   Max (lbs) 80   Hold Time 99   Rest Time 5   Time 15     Manual Therapy   Manual Therapy Soft tissue mobilization   Soft tissue mobilization right low back paraspinal and glute                   PT Short Term Goals - 12/23/15 1222      PT SHORT TERM GOAL #1   Title Ind with a HEP.   Period Weeks   Status Achieved  12/23/15           PT Long Term Goals - 03/22/16 1208      PT LONG TERM GOAL #1   Title Perform ADL's with pain not > 3/10.   Baseline 8/10 or more by lunch time   Time 8   Period Weeks   Status On-going     PT LONG TERM GOAL #2   Title Stand 45 minutes with pain not > 3-4/10.   Baseline able to make a pound cake without resting 7/10 pain   Time 8   Period Weeks   Status  On-going     PT LONG TERM GOAL #3   Title Patient to report decreased B knee pain by 25% with ADLS   Time 6   Period Weeks   Status New               Plan - 03/30/16 1140    Clinical Impression Statement Patient has reported ongoing pain and muscle tightness in right low back. Patient feels temporary relief after tharapy. Patient reported 85%-90% improvement overall. Patient unable to stand for prolong time or perform ADL's due to ongoing pain increase. Patient goals ongoing due to pain deficts.   Rehab Potential Good   Clinical Impairments Affecting Rehab Potential Ongoing and progressing spinal pain over many years.   PT Frequency 2x / week   PT Duration 6 weeks   PT Treatment/Interventions ADLs/Self Care Home Management;Cryotherapy;Electrical Stimulation;Ultrasound;Traction;Moist Heat;Therapeutic activities;Therapeutic exercise;Dry needling;Patient/family education;Manual techniques   PT Next Visit Plan cont with POC per MPT   Consulted and Agree with Plan of Care Patient      Patient will benefit from skilled therapeutic intervention in order to improve the following deficits and impairments:  Decreased activity tolerance, Decreased range of motion, Pain  Visit Diagnosis: Chronic pain of left knee  Chronic right-sided low back pain without sciatica     Problem List Patient Active Problem List   Diagnosis Date Noted  . Osteopenia of the elderly 02/19/2014  . DDD (degenerative disc disease), lumbar 12/03/2013  . Scoliosis 12/03/2013  . Chronic LBP 05/14/2013  . IBS (irritable bowel syndrome) 11/08/2012  . UC (ulcerative colitis) ([place]) 04/23/2012  . Palpitation 10/26/2011  . Hypertension 10/26/2011  . Chronic ulcerative colitis ([place]) 03/23/2011    Amadea Keagy P, PTA 03/30/2016, 12:05 PM  [facility] [place], [place], [address] Phone: [phone]   Fax:  [phone]  Name: Lori Soto MRN: [id number] Date of Birth: 06-Nov-1935

## 2016-04-04 ENCOUNTER — Telehealth (INDEPENDENT_AMBULATORY_CARE_PROVIDER_SITE_OTHER): Payer: Self-pay | Admitting: Internal Medicine

## 2016-04-04 ENCOUNTER — Other Ambulatory Visit (INDEPENDENT_AMBULATORY_CARE_PROVIDER_SITE_OTHER): Payer: Self-pay | Admitting: *Deleted

## 2016-04-04 DIAGNOSIS — T184XXA Foreign body in colon, initial encounter: Secondary | ICD-10-CM

## 2016-04-04 NOTE — Telephone Encounter (Signed)
Forwarded this to Lelon Frohlich to address for review.

## 2016-04-04 NOTE — Telephone Encounter (Signed)
Patient aware can go anytime for KUB

## 2016-04-04 NOTE — Telephone Encounter (Signed)
Per D.Rehman - will order KUB - single plain film to make sure that the clips have passed. Patient will be made aware.

## 2016-04-04 NOTE — Telephone Encounter (Signed)
Patient called, stated that when Dr. Laural Golden did her last colonoscopy to not have a MRI done until the clamps he put in dissolved.  She wants to know if they have had time to dissolve.  [phone]

## 2016-04-07 ENCOUNTER — Ambulatory Visit: Payer: Medicare Other | Attending: Family Medicine | Admitting: Physical Therapy

## 2016-04-07 DIAGNOSIS — M25562 Pain in left knee: Secondary | ICD-10-CM | POA: Diagnosis not present

## 2016-04-07 DIAGNOSIS — G8929 Other chronic pain: Secondary | ICD-10-CM

## 2016-04-07 DIAGNOSIS — M545 Low back pain, unspecified: Secondary | ICD-10-CM

## 2016-04-07 DIAGNOSIS — R293 Abnormal posture: Secondary | ICD-10-CM

## 2016-04-07 NOTE — Therapy (Signed)
[facility] [place], [place], [address] Phone: [phone]   Fax:  [phone]  Physical Therapy Treatment  Patient Details  Name: Lori Soto MRN: [id number] Date of Birth: [birth year] Referring Provider: Redge Gainer MD  Encounter Date: 04/07/2016      PT End of Session - 04/07/16 1819    Visit Number 27   Number of Visits 30   Date for PT Re-Evaluation 04/26/16   PT Start Time 0322   PT Stop Time 0412   PT Time Calculation (min) 50 min   Activity Tolerance Patient tolerated treatment well   Behavior During Therapy [facility] for tasks assessed/performed      Past Medical History:  Diagnosis Date  . Anemia   . Cancer ([place])    skin cancer on nose  . Carpal tunnel syndrome, bilateral   . Cataract   . Esophagitis   . GERD (gastroesophageal reflux disease)   . H/O hiatal hernia   . Osteopenia   . Scoliosis   . Shingles   . Shingles   . Ulcerative colitis     Past Surgical History:  Procedure Laterality Date  . ABDOMINAL HYSTERECTOMY    . BUNIONECTOMY     Bilateral  . CARPAL TUNNEL RELEASE Left 03/10/2015   Procedure: LEFT CARPAL TUNNEL RELEASE;  Surgeon: Daryll Brod, MD;  Location: [facility];  Service: Orthopedics;  Laterality: Left;  . CATARACT EXTRACTION W/PHACO  05/28/2012   Procedure: CATARACT EXTRACTION PHACO AND INTRAOCULAR LENS PLACEMENT (IOC);  Surgeon: Tonny Branch, MD;  [facility];  Service: Ophthalmology;  Laterality: Right;  CDE=12.84  . CATARACT EXTRACTION W/PHACO  06/07/2012   Procedure: CATARACT EXTRACTION PHACO AND INTRAOCULAR LENS PLACEMENT (IOC);  Surgeon: Tonny Branch, MD;  [facility];  Service: Ophthalmology;  Laterality: Left;  CDE 17.60  . COLONOSCOPY    . COLONOSCOPY N/A 07/08/2015   Procedure: COLONOSCOPY;  Surgeon: Rogene Houston, MD;  [facility];  Service: Endoscopy;  Laterality: N/A;  930  . ESOPHAGEAL DILATION N/A 07/08/2015   Procedure: ESOPHAGEAL  DILATION;  Surgeon: Rogene Houston, MD;  [facility];  Service: Endoscopy;  Laterality: N/A;  . ESOPHAGOGASTRODUODENOSCOPY N/A 07/08/2015   Procedure: ESOPHAGOGASTRODUODENOSCOPY (EGD);  Surgeon: Rogene Houston, MD;  [facility];  Service: Endoscopy;  Laterality: N/A;  . Hernia     Right inguinal  . HERNIA REPAIR  1961   right inguinal hernia  . UPPER GASTROINTESTINAL ENDOSCOPY      There were no vitals filed for this visit.      Subjective Assessment - 04/07/16 1819    Subjective I fel about 75% better overall.                         [facility] Adult PT Treatment/Exercise - 04/07/16 0001      Modalities   Modalities Electrical Stimulation;Moist Heat     Moist Heat Therapy   Number Minutes Moist Heat 15 Minutes   Moist Heat Location --  RT LB.     Acupuncturist Location --  RT LB.   Electrical Stimulation Action Pre-mod constant.   Electrical Stimulation Goals Pain     Traction   Type of Traction Lumbar   Min (lbs) 5   Max (lbs) 85   Hold Time 99   Rest Time 5   Time 15     Manual Therapy  Manual Therapy Soft tissue mobilization   Soft tissue mobilization RT LB QL release x 8 minutes.                  PT Short Term Goals - 12/23/15 1222      PT SHORT TERM GOAL #1   Title Ind with a HEP.   Period Weeks   Status Achieved  12/23/15           PT Long Term Goals - 03/22/16 1208      PT LONG TERM GOAL #1   Title Perform ADL's with pain not > 3/10.   Baseline 8/10 or more by lunch time   Time 8   Period Weeks   Status On-going     PT LONG TERM GOAL #2   Title Stand 45 minutes with pain not > 3-4/10.   Baseline able to make a pound cake without resting 7/10 pain   Time 8   Period Weeks   Status On-going     PT LONG TERM GOAL #3   Title Patient to report decreased B knee pain by 25% with ADLS   Time 6   Period Weeks   Status New               Plan -  04/07/16 1820    Clinical Impression Statement Patient stating she feels 75% better overall.  CC today was localized to her right QL.  Patient tolerated traction at 85# today very well.      Patient will benefit from skilled therapeutic intervention in order to improve the following deficits and impairments:  Decreased activity tolerance, Decreased range of motion, Pain  Visit Diagnosis: Chronic right-sided low back pain without sciatica  Abnormal posture     Problem List Patient Active Problem List   Diagnosis Date Noted  . Osteopenia of the elderly 02/19/2014  . DDD (degenerative disc disease), lumbar 12/03/2013  . Scoliosis 12/03/2013  . Chronic LBP 05/14/2013  . IBS (irritable bowel syndrome) 11/08/2012  . UC (ulcerative colitis) ([place]) 04/23/2012  . Palpitation 10/26/2011  . Hypertension 10/26/2011  . Chronic ulcerative colitis ([place]) 03/23/2011    [place], [place] MPT 04/07/2016, 6:25 PM  [facility] [address] [place], [place], [address] Phone: [phone]   Fax:  [phone]  Name: Lori Soto MRN: [id number] Date of Birth: [birth year]

## 2016-04-12 ENCOUNTER — Encounter: Payer: Self-pay | Admitting: Physical Therapy

## 2016-04-12 ENCOUNTER — Ambulatory Visit: Payer: Medicare Other | Admitting: Physical Therapy

## 2016-04-12 DIAGNOSIS — R293 Abnormal posture: Secondary | ICD-10-CM | POA: Diagnosis not present

## 2016-04-12 DIAGNOSIS — M545 Low back pain: Secondary | ICD-10-CM | POA: Diagnosis not present

## 2016-04-12 DIAGNOSIS — G8929 Other chronic pain: Secondary | ICD-10-CM

## 2016-04-12 DIAGNOSIS — M25562 Pain in left knee: Secondary | ICD-10-CM | POA: Diagnosis not present

## 2016-04-12 NOTE — Therapy (Signed)
[facility] [place], [place], [address] Phone: [phone]   Fax:  [phone]  Physical Therapy Treatment  Patient Details  Name: Lori Soto MRN: [id number] Date of Birth: [birth year] Referring Provider: Redge Gainer MD  Encounter Date: 04/12/2016      PT End of Session - 04/12/16 1353    Visit Number 28   Number of Visits 30   Date for PT Re-Evaluation 04/26/16   PT Start Time 6962   PT Stop Time 1437   PT Time Calculation (min) 44 min   Activity Tolerance Patient tolerated treatment well   Behavior During Therapy [facility] for tasks assessed/performed      Past Medical History:  Diagnosis Date  . Anemia   . Cancer ([place])    skin cancer on nose  . Carpal tunnel syndrome, bilateral   . Cataract   . Esophagitis   . GERD (gastroesophageal reflux disease)   . H/O hiatal hernia   . Osteopenia   . Scoliosis   . Shingles   . Shingles   . Ulcerative colitis     Past Surgical History:  Procedure Laterality Date  . ABDOMINAL HYSTERECTOMY    . BUNIONECTOMY     Bilateral  . CARPAL TUNNEL RELEASE Left 03/10/2015   Procedure: LEFT CARPAL TUNNEL RELEASE;  Surgeon: Daryll Brod, MD;  Location: [place];  Service: Orthopedics;  Laterality: Left;  . CATARACT EXTRACTION W/PHACO  05/28/2012   Procedure: CATARACT EXTRACTION PHACO AND INTRAOCULAR LENS PLACEMENT (IOC);  Surgeon: Tonny Branch, MD;  [facility];  Service: Ophthalmology;  Laterality: Right;  CDE=12.84  . CATARACT EXTRACTION W/PHACO  06/07/2012   Procedure: CATARACT EXTRACTION PHACO AND INTRAOCULAR LENS PLACEMENT (IOC);  Surgeon: Tonny Branch, MD;  [facility];  Service: Ophthalmology;  Laterality: Left;  CDE 17.60  . COLONOSCOPY    . COLONOSCOPY N/A 07/08/2015   Procedure: COLONOSCOPY;  Surgeon: Rogene Houston, MD;  [facility];  Service: Endoscopy;  Laterality: N/A;  930  . ESOPHAGEAL DILATION N/A 07/08/2015   Procedure: ESOPHAGEAL  DILATION;  Surgeon: Rogene Houston, MD;  [facility];  Service: Endoscopy;  Laterality: N/A;  . ESOPHAGOGASTRODUODENOSCOPY N/A 07/08/2015   Procedure: ESOPHAGOGASTRODUODENOSCOPY (EGD);  Surgeon: Rogene Houston, MD;  [facility];  Service: Endoscopy;  Laterality: N/A;  . Hernia     Right inguinal  . HERNIA REPAIR  1961   right inguinal hernia  . UPPER GASTROINTESTINAL ENDOSCOPY      There were no vitals filed for this visit.      Subjective Assessment - 04/12/16 1351    Subjective Reports increased pain yesterday with increased activity and used Salonpas. Reports she was busy today prior to treatment and reports pain is starting.   Limitations Standing   How long can you stand comfortably? 45-60 minutes. as of 03/15/16 (but doesn't last if I stop traction)   Currently in Pain? Yes   Pain Score 7    Pain Location Back   Pain Orientation Right;Lower;Left   Pain Descriptors / Indicators Tightness   Pain Type Chronic pain   Pain Radiating Towards down R hip   Pain Onset More than a month ago            [facility] PT Assessment - 04/12/16 0001      Assessment   Medical Diagnosis Scoliosis.     Precautions   Precautions None     Restrictions   Weight  Bearing Restrictions No                     [facility] Adult PT Treatment/Exercise - 04/12/16 0001      Modalities   Modalities Ultrasound;Traction     Ultrasound   Ultrasound Location R low back/ superior glute   Ultrasound Parameters 1.5 w/cm2, 100%, 1 mhz x10 min   Ultrasound Goals Pain     Traction   Type of Traction Lumbar   Min (lbs) 5   Max (lbs) 85   Hold Time 99   Rest Time 5   Time 15     Manual Therapy   Manual Therapy Soft tissue mobilization   Soft tissue mobilization STW to R superior glute/QL/ lumbar paraspinals in L sidelying to decrease tightness and pain                  PT Short Term Goals - 12/23/15 1222      PT SHORT TERM GOAL #1   Title Ind with a  HEP.   Period Weeks   Status Achieved  12/23/15           PT Long Term Goals - 03/22/16 1208      PT LONG TERM GOAL #1   Title Perform ADL's with pain not > 3/10.   Baseline 8/10 or more by lunch time   Time 8   Period Weeks   Status On-going     PT LONG TERM GOAL #2   Title Stand 45 minutes with pain not > 3-4/10.   Baseline able to make a pound cake without resting 7/10 pain   Time 8   Period Weeks   Status On-going     PT LONG TERM GOAL #3   Title Patient to report decreased B knee pain by 25% with ADLS   Time 6   Period Weeks   Status New               Plan - 04/12/16 1438    Clinical Impression Statement Patient presented in clinic with continued reports of R low back pain but especially into R hip today. Patient tolerated treatment well and reported experiencing benefit from manual therapy during the STW. TIghtness was palpated in R superior glute region and QL region. Normal modalities response noted following removal of the modalities.   Rehab Potential Good   Clinical Impairments Affecting Rehab Potential Ongoing and progressing spinal pain over many years.   PT Frequency 2x / week   PT Duration 6 weeks   PT Treatment/Interventions ADLs/Self Care Home Management;Cryotherapy;Electrical Stimulation;Ultrasound;Traction;Moist Heat;Therapeutic activities;Therapeutic exercise;Dry needling;Patient/family education;Manual techniques   PT Next Visit Plan cont with POC per MPT   PT Home Exercise Plan childs pose seated in chair; thoracic extension   Consulted and Agree with Plan of Care Patient      Patient will benefit from skilled therapeutic intervention in order to improve the following deficits and impairments:  Decreased activity tolerance, Decreased range of motion, Pain  Visit Diagnosis: Chronic right-sided low back pain without sciatica  Abnormal posture     Problem List Patient Active Problem List   Diagnosis Date Noted  . Osteopenia of the  elderly 02/19/2014  . DDD (degenerative disc disease), lumbar 12/03/2013  . Scoliosis 12/03/2013  . Chronic LBP 05/14/2013  . IBS (irritable bowel syndrome) 11/08/2012  . UC (ulcerative colitis) ([place]) 04/23/2012  . Palpitation 10/26/2011  . Hypertension 10/26/2011  . Chronic ulcerative colitis ([place]) 03/23/2011  Wynelle Fanny, PTA 04/12/2016, 2:42 PM  [facility] [address] [place], [place], [address] Phone: [phone]   Fax:  [phone]  Name: Lori Soto MRN: [id number] Date of Birth: 1935/08/30

## 2016-04-19 ENCOUNTER — Ambulatory Visit: Payer: Medicare Other | Admitting: Physical Therapy

## 2016-04-21 ENCOUNTER — Ambulatory Visit: Payer: Medicare Other | Admitting: Physical Therapy

## 2016-04-21 ENCOUNTER — Encounter: Payer: Self-pay | Admitting: Physical Therapy

## 2016-04-21 DIAGNOSIS — M25562 Pain in left knee: Secondary | ICD-10-CM

## 2016-04-21 DIAGNOSIS — R293 Abnormal posture: Secondary | ICD-10-CM | POA: Diagnosis not present

## 2016-04-21 DIAGNOSIS — M545 Low back pain: Principal | ICD-10-CM

## 2016-04-21 DIAGNOSIS — G8929 Other chronic pain: Secondary | ICD-10-CM | POA: Diagnosis not present

## 2016-04-21 DIAGNOSIS — M1712 Unilateral primary osteoarthritis, left knee: Secondary | ICD-10-CM | POA: Diagnosis not present

## 2016-04-21 DIAGNOSIS — M1711 Unilateral primary osteoarthritis, right knee: Secondary | ICD-10-CM | POA: Diagnosis not present

## 2016-04-21 NOTE — Therapy (Signed)
[facility] [place], [place], [address] Phone: [phone]   Fax:  [phone]  Physical Therapy Treatment  Patient Details  Name: Lori Soto MRN: [id number] Date of Birth: 05/22/1935 Referring Provider: Redge Gainer MD  Encounter Date: 04/21/2016      PT End of Session - 04/21/16 1114    Visit Number 29   Number of Visits 30   Date for PT Re-Evaluation 04/26/16   PT Start Time 1105   PT Stop Time 1145   PT Time Calculation (min) 40 min   Activity Tolerance Patient tolerated treatment well   Behavior During Therapy [facility] for tasks assessed/performed      Past Medical History:  Diagnosis Date  . Anemia   . Cancer ([place])    skin cancer on nose  . Carpal tunnel syndrome, bilateral   . Cataract   . Esophagitis   . GERD (gastroesophageal reflux disease)   . H/O hiatal hernia   . Osteopenia   . Scoliosis   . Shingles   . Shingles   . Ulcerative colitis     Past Surgical History:  Procedure Laterality Date  . ABDOMINAL HYSTERECTOMY    . BUNIONECTOMY     Bilateral  . CARPAL TUNNEL RELEASE Left 03/10/2015   Procedure: LEFT CARPAL TUNNEL RELEASE;  Surgeon: Daryll Brod, MD;  Location: [facility];  Service: Orthopedics;  Laterality: Left;  . CATARACT EXTRACTION W/PHACO  05/28/2012   Procedure: CATARACT EXTRACTION PHACO AND INTRAOCULAR LENS PLACEMENT (IOC);  Surgeon: Tonny Branch, MD;  Location: [facility];  Service: Ophthalmology;  Laterality: Right;  CDE=12.84  . CATARACT EXTRACTION W/PHACO  06/07/2012   Procedure: CATARACT EXTRACTION PHACO AND INTRAOCULAR LENS PLACEMENT (IOC);  Surgeon: Tonny Branch, MD;  Location: [facility];  Service: Ophthalmology;  Laterality: Left;  CDE 17.60  . COLONOSCOPY    . COLONOSCOPY N/A 07/08/2015   Procedure: COLONOSCOPY;  Surgeon: Rogene Houston, MD;  Location: [facility];  Service: Endoscopy;  Laterality: N/A;  930  . ESOPHAGEAL DILATION N/A 07/08/2015   Procedure: ESOPHAGEAL  DILATION;  Surgeon: Rogene Houston, MD;  [facility];  Service: Endoscopy;  Laterality: N/A;  . ESOPHAGOGASTRODUODENOSCOPY N/A 07/08/2015   Procedure: ESOPHAGOGASTRODUODENOSCOPY (EGD);  Surgeon: Rogene Houston, MD;  [facility];  Service: Endoscopy;  Laterality: N/A;  . Hernia     Right inguinal  . HERNIA REPAIR  1961   right inguinal hernia  . UPPER GASTROINTESTINAL ENDOSCOPY      There were no vitals filed for this visit.      Subjective Assessment - 04/21/16 1108    Subjective Patient has reported ongoing soreness   Limitations Standing   How long can you stand comfortably? 45-60 minutes. as of 03/15/16 (but doesn't last if I stop traction)   Currently in Pain? Yes   Pain Score 5    Pain Location Back   Pain Orientation Right;Left;Lower   Pain Descriptors / Indicators Tightness   Pain Type Chronic pain   Pain Onset More than a month ago   Aggravating Factors  prolong activity                         [facility] Adult PT Treatment/Exercise - 04/21/16 0001      Ultrasound   Ultrasound Location rt low back   Ultrasound Parameters 1.5w/cm2/50%/57mz x170m   Ultrasound Goals Pain     Traction   Type of  Traction Lumbar   Min (lbs) 5   Max (lbs) 85   Hold Time 99   Rest Time 5   Time 15     Manual Therapy   Manual Therapy Soft tissue mobilization   Soft tissue mobilization gentle STW to right low back                  PT Short Term Goals - 12/23/15 1222      PT SHORT TERM GOAL #1   Title Ind with a HEP.   Period Weeks   Status Achieved  12/23/15           PT Long Term Goals - 04/21/16 1112      PT LONG TERM GOAL #1   Title Perform ADL's with pain not > 3/10.   Baseline 8/10 or more by lunch time   Time 8   Period Weeks   Status Not Met  5-10/10 04/21/16     PT LONG TERM GOAL #2   Title Stand 45 minutes with pain not > 3-4/10.   Baseline able to make a pound cake without resting 7/10 pain   Time 8    Period Weeks   Status Not Met  5-10/10 04/21/16     PT LONG TERM GOAL #3   Title Patient to report decreased B knee pain by 25% with ADLS   Time 6   Period Weeks   Status Not Met  no improvement 04/21/16               Plan - 04/21/16 1120    Clinical Impression Statement Patient unable to meet goals due to ongoing pain. Patient reported pain in low back esp right side with any standing esp up to 10/10 after 45 min. Patient has little pain at rest yet any standing increases pain. Patient went to MD to drain fluid off knee today. Patient reports relief after therapy each visit although temporary.    Rehab Potential Good   Clinical Impairments Affecting Rehab Potential Ongoing and progressing spinal pain over many years.   PT Frequency 2x / week   PT Duration 6 weeks   PT Treatment/Interventions ADLs/Self Care Home Management;Cryotherapy;Electrical Stimulation;Ultrasound;Traction;Moist Heat;Therapeutic activities;Therapeutic exercise;Dry needling;Patient/family education;Manual techniques   PT Next Visit Plan DC   Consulted and Agree with Plan of Care Patient      Patient will benefit from skilled therapeutic intervention in order to improve the following deficits and impairments:  Decreased activity tolerance, Decreased range of motion, Pain  Visit Diagnosis: Chronic right-sided low back pain without sciatica  Abnormal posture  Chronic pain of left knee     Problem List Patient Active Problem List   Diagnosis Date Noted  . Osteopenia of the elderly 02/19/2014  . DDD (degenerative disc disease), lumbar 12/03/2013  . Scoliosis 12/03/2013  . Chronic LBP 05/14/2013  . IBS (irritable bowel syndrome) 11/08/2012  . UC (ulcerative colitis) ([place]) 04/23/2012  . Palpitation 10/26/2011  . Hypertension 10/26/2011  . Chronic ulcerative colitis ([place]) 03/23/2011    Ladean Raya, PTA 04/21/16 3:22 PM  [facility] [place], [place], [address] Phone: [phone]   Fax:  [phone]  Name: Lori Soto MRN: [id number] Date of Birth: 07-07-1935  PHYSICAL THERAPY DISCHARGE SUMMARY  Visits from Start of Care: 29.  Current functional level related to goals / functional outcomes: See above.   Remaining deficits: See above.   Education / Equipment: HEP. Plan: Patient agrees to discharge.  Patient goals were partially met. Patient is being discharged due to lack of progress.  ?????         Mali Applegate MPT

## 2016-06-08 ENCOUNTER — Telehealth: Payer: Self-pay | Admitting: Family Medicine

## 2016-06-08 NOTE — Telephone Encounter (Signed)
Pt aware of DEXA results and was given the reason we could not use her back for diagnosis purposes

## 2016-06-09 ENCOUNTER — Encounter: Payer: Self-pay | Admitting: Family Medicine

## 2016-06-09 ENCOUNTER — Ambulatory Visit (INDEPENDENT_AMBULATORY_CARE_PROVIDER_SITE_OTHER): Payer: Medicare Other | Admitting: Family Medicine

## 2016-06-09 ENCOUNTER — Ambulatory Visit (INDEPENDENT_AMBULATORY_CARE_PROVIDER_SITE_OTHER): Payer: Medicare Other

## 2016-06-09 ENCOUNTER — Encounter (INDEPENDENT_AMBULATORY_CARE_PROVIDER_SITE_OTHER): Payer: Self-pay

## 2016-06-09 VITALS — BP 126/86 | HR 69 | Temp 97.6°F | Ht 62.0 in | Wt 137.0 lb

## 2016-06-09 DIAGNOSIS — I10 Essential (primary) hypertension: Secondary | ICD-10-CM

## 2016-06-09 DIAGNOSIS — K51918 Ulcerative colitis, unspecified with other complication: Secondary | ICD-10-CM

## 2016-06-09 DIAGNOSIS — M4125 Other idiopathic scoliosis, thoracolumbar region: Secondary | ICD-10-CM

## 2016-06-09 DIAGNOSIS — M8580 Other specified disorders of bone density and structure, unspecified site: Secondary | ICD-10-CM | POA: Diagnosis not present

## 2016-06-09 DIAGNOSIS — E559 Vitamin D deficiency, unspecified: Secondary | ICD-10-CM

## 2016-06-09 NOTE — Progress Notes (Signed)
Subjective:    Patient ID: Lori Soto, female    DOB: [birth year], 81 y.o.   MRN: [id number]  HPI Pt here for follow up and management of chronic medical problems which includes hypertension. She is taking medication regularly.The patient today complains of soreness in the upper abdomen. She is in need of a chest x-ray which she will get today and she'll be given an FOBT to return. She will also get her lab work today. The patient today denies any chest pain pressure or tightness or shortness of breath. She denies any problems with her stomach other than when she wears the brace to support her back at times that she does have some epigastric discomfort. Movements are good and there is no blood in the stool or black tarry bowel movements. She is passing her water without problems.     Patient Active Problem List   Diagnosis Date Noted  . Osteopenia of the elderly 02/19/2014  . DDD (degenerative disc disease), lumbar 12/03/2013  . Scoliosis 12/03/2013  . Chronic LBP 05/14/2013  . IBS (irritable bowel syndrome) 11/08/2012  . UC (ulcerative colitis) ([place]) 04/23/2012  . Palpitation 10/26/2011  . Hypertension 10/26/2011  . Chronic ulcerative colitis ([place]) 03/23/2011   Outpatient Encounter Prescriptions as of 06/09/2016  Medication Sig  . ASACOL HD 800 MG TBEC TAKE 2 TABLETS TWICE A DAY (Patient taking differently: take 2 tabs QD)  . Bromelains 500 MG TABS Take 500 mg by mouth daily.  . Calcium Carbonate-Vitamin D (CALCIUM 600+D) 600-400 MG-UNIT per tablet Take 1 tablet by mouth every evening.  [name] 500 MG CAPS Take 1 capsule by mouth daily.   . Coenzyme Q10 (CO Q-10 PO) Take 1 tablet by mouth 2 (two) times daily. With red yeast rice Pur[employer] Brand  . COLLAGEN PO Take 1 tablet by mouth daily. This is known as 1-2-3  . Crisaborole (EUCRISA) 2 % OINT Apply 1 drop topically daily.  . Cyanocobalamin (VITAMIN B 12 PO) Take 1,000 mcg by mouth daily.   [name] estradiol (ESTRACE) 1 MG  tablet Take 1 mg by mouth every other day.   . Ginkgo Biloba 40 MG TABS Take 40 mg by mouth daily.   [name] glucosamine-chondroitin 500-400 MG tablet Take 1 tablet by mouth 2 (two) times daily.    [name] Hyaluronic Acid-Vitamin C (HYALURONIC ACID PO) Take 80 mg by mouth daily.  [name] MILK THISTLE PO Take 100 mg by mouth daily.  . Misc Natural Products (BUTCHERS BROOM PO) Take 1 tablet by mouth daily.   . MULTIPLE VITAMINS PO Take 1 tablet by mouth daily. In the mornings  . Nutritional Supplements (GRAPESEED EXTRACT PO) Take 60 mg by mouth daily.   [name] OAT BRAN SOLUBLE PO Take 1 tablet by mouth daily.  [name] OVER THE COUNTER MEDICATION Take 66 mg by mouth daily. Vision Gold Lutein  . OVER THE COUNTER MEDICATION Take 250 mg by mouth daily. Alphalipoic Acid 250 mg daily  . pantoprazole (PROTONIX) 40 MG tablet Take 1 tablet (40 mg total) by mouth daily before breakfast.  . POTASSIUM GLUCONATE PO Take 99 mg by mouth daily. This is Chelated Potassium -Puritans Pride  . pyridOXINE (VITAMIN B-6) 100 MG tablet Take 100 mg by mouth daily.   . Turmeric 500 MG CAPS Take 1 capsule by mouth 3 (three) times daily.   . Vitamins C E (CRANBERRY CONCENTRATE PO) Take 2,500 mg by mouth daily.   . Zinc 50 MG CAPS Take 50 mg by  mouth daily.   . mupirocin ointment (BACTROBAN) 2 % Place 1 application into the nose 2 (two) times daily. (Patient not taking: Reported on 06/09/2016)   No facility-administered encounter medications on file as of 06/09/2016.      Review of Systems  Constitutional: Negative.   HENT: Negative.   Eyes: Negative.   Respiratory: Negative.   Cardiovascular: Negative.   Gastrointestinal: Positive for abdominal pain (upper abdominal).  Endocrine: Negative.   Genitourinary: Negative.   Musculoskeletal: Negative.   Skin: Negative.   Allergic/Immunologic: Negative.   Neurological: Negative.   Hematological: Negative.   Psychiatric/Behavioral: Negative.        Objective:   Physical Exam  Constitutional: She  is oriented to person, place, and time. She appears well-developed and well-nourished. No distress.  The patient is small framed pleasant and alert  HENT:  Head: Normocephalic and atraumatic.  Right Ear: External ear normal.  Left Ear: External ear normal.  Nose: Nose normal.  Mouth/Throat: Oropharynx is clear and moist. No oropharyngeal exudate.  Eyes: Conjunctivae and EOM are normal. Pupils are equal, round, and reactive to light. Right eye exhibits no discharge. Left eye exhibits no discharge. No scleral icterus.  Neck: Normal range of motion. Neck supple. No thyromegaly present.  No bruits thyromegaly or anterior cervical adenopathy  Cardiovascular: Normal rate, regular rhythm, normal heart sounds and intact distal pulses.   No murmur heard. The heart is regular at 72/m  Pulmonary/Chest: Effort normal and breath sounds normal. No respiratory distress. She has no wheezes. She has no rales.  Clear anteriorly and posteriorly  Abdominal: Soft. Bowel sounds are normal. She exhibits no mass. There is no tenderness. There is no rebound and no guarding.  No abdominal tenderness masses or organ enlargement or bruits no inguinal adenopathy  Musculoskeletal: Normal range of motion. She exhibits no edema.  The patient is somewhat hesitant with her gait and with her laying down supine on the table due to her back.  Lymphadenopathy:    She has no cervical adenopathy.  Neurological: She is alert and oriented to person, place, and time. She has normal reflexes. No cranial nerve deficit.  Skin: Skin is warm and dry. No rash noted.  Psychiatric: She has a normal mood and affect. Her behavior is normal. Judgment and thought content normal.  Nursing note and vitals reviewed.  BP 126/86 (BP Location: Left Arm)   Pulse 69   Temp 97.6 F (36.4 C) (Oral)   Ht _0  (1.575 m)   Wt 137 lb (62.1 kg)   BMI 25.06 kg/m   Chest x-ray with results pending      Assessment & Plan:  1. Essential  hypertension -The blood pressure is good today and she will continue with current treatment - CBC with Differential/Platelet - BMP8+EGFR - Lipid panel - Hepatic function panel - DG Chest 2 View; Future  2. Vitamin D deficiency -Continue with current treatment pending results of lab work - CBC with Differential/Platelet - VITAMIN D 25 Hydroxy (Vit-D Deficiency, Fractures)  3. Ulcerative colitis with other complication, unspecified location [facility]) -Continue follow-up with gastroenterology  4. Adult idiopathic scoliosis of thoracolumbar region -The patient has had the scoliosis for years.  5. Osteopenia of the elderly -Continue with calcium and vitamin D. -Continue to make all efforts at keeping herself from falling  Patient Instructions                       Medicare Annual Wellness Visit  [facility] and the medical providers at [facility] strive to bring you the best medical care.  In doing so we not only want to address your current medical conditions and concerns but also to detect new conditions early and prevent illness, disease and health-related problems.    Medicare offers a yearly Wellness Visit which allows our clinical staff to assess your need for preventative services including immunizations, lifestyle education, counseling to decrease risk of preventable diseases and screening for fall risk and other medical concerns.    This visit is provided free of charge (no copay) for all Medicare recipients. The clinical pharmacists at [facility] have begun to conduct these Wellness Visits which will also include a thorough review of all your medications.    As you primary medical provider recommend that you make an appointment for your Annual Wellness Visit if you have not done so already this year.  You may set up this appointment before you leave today or you may call back ([phone]) and schedule an appointment.  Please make sure  when you call that you mention that you are scheduling your Annual Wellness Visit with the clinical pharmacist so that the appointment may be made for the proper length of time.     Continue current medications. Continue good therapeutic lifestyle changes which include good diet and exercise. Fall precautions discussed with patient. If an FOBT was given today- please return it to our front desk. If you are over 90 years old - you may need Prevnar 61 or the adult Pneumonia vaccine.  **Flu shots are available--- please call and schedule a FLU-CLINIC appointment**  After your visit with [place] today you will receive a survey in the mail or online from [employer] regarding your care with [place]. Please take a moment to fill this out. Your feedback is very important to [place] as you can help [place] better understand your patient needs as well as improve your experience and satisfaction. WE CARE ABOUT YOU!!!   The patient should make every effort to not put herself at risk for falling She should avoid climbing We will call with lab work results FOBT results and chest x-ray results as soon as those results become available    Arrie Senate MD

## 2016-06-09 NOTE — Patient Instructions (Addendum)
Medicare Annual Wellness Visit  [facility] and the medical providers at [facility] strive to bring you the best medical care.  In doing so we not only want to address your current medical conditions and concerns but also to detect new conditions early and prevent illness, disease and health-related problems.    Medicare offers a yearly Wellness Visit which allows our clinical staff to assess your need for preventative services including immunizations, lifestyle education, counseling to decrease risk of preventable diseases and screening for fall risk and other medical concerns.    This visit is provided free of charge (no copay) for all Medicare recipients. The clinical pharmacists at [facility] have begun to conduct these Wellness Visits which will also include a thorough review of all your medications.    As you primary medical provider recommend that you make an appointment for your Annual Wellness Visit if you have not done so already this year.  You may set up this appointment before you leave today or you may call back ([phone]) and schedule an appointment.  Please make sure when you call that you mention that you are scheduling your Annual Wellness Visit with the clinical pharmacist so that the appointment may be made for the proper length of time.     Continue current medications. Continue good therapeutic lifestyle changes which include good diet and exercise. Fall precautions discussed with patient. If an FOBT was given today- please return it to our front desk. If you are over 26 years old - you may need Prevnar 25 or the adult Pneumonia vaccine.  **Flu shots are available--- please call and schedule a FLU-CLINIC appointment**  After your visit with [place] today you will receive a survey in the mail or online from [employer] regarding your care with [place]. Please take a moment to fill this out. Your feedback is very  important to [place] as you can help [place] better understand your patient needs as well as improve your experience and satisfaction. WE CARE ABOUT YOU!!!   The patient should make every effort to not put herself at risk for falling She should avoid climbing We will call with lab work results FOBT results and chest x-ray results as soon as those results become available

## 2016-06-10 LAB — BMP8+EGFR
BUN/Creatinine Ratio: 13 (ref 12–28)
BUN: 9 mg/dL (ref 8–27)
CALCIUM: 10 mg/dL (ref 8.7–10.3)
CHLORIDE: 100 mmol/L (ref 96–106)
CO2: 30 mmol/L — ABNORMAL HIGH (ref 18–29)
CREATININE: 0.67 mg/dL (ref 0.57–1.00)
GFR calc Af Amer: 97 mL/min/{1.73_m2} (ref >59)
GFR calc non Af Amer: 84 mL/min/{1.73_m2} (ref >59)
GLUCOSE: 92 mg/dL (ref 65–99)
Potassium: 4.9 mmol/L (ref 3.5–5.2)
Sodium: 142 mmol/L (ref 134–144)

## 2016-06-10 LAB — CBC WITH DIFFERENTIAL/PLATELET
BASOS: 1 %
Basophils Absolute: 0 10*3/uL (ref 0.0–0.2)
EOS (ABSOLUTE): 0.1 10*3/uL (ref 0.0–0.4)
Eos: 1 %
HEMOGLOBIN: 13.5 g/dL (ref 11.1–15.9)
Hematocrit: 41.4 % (ref 34.0–46.6)
IMMATURE GRANS (ABS): 0 10*3/uL (ref 0.0–0.1)
IMMATURE GRANULOCYTES: 0 %
Lymphocytes Absolute: 1.8 10*3/uL (ref 0.7–3.1)
Lymphs: 35 %
MCH: 29 pg (ref 26.6–33.0)
MCHC: 32.6 g/dL (ref 31.5–35.7)
MCV: 89 fL (ref 79–97)
MONOCYTES: 9 %
Monocytes Absolute: 0.5 10*3/uL (ref 0.1–0.9)
NEUTROS ABS: 2.9 10*3/uL (ref 1.4–7.0)
NEUTROS PCT: 54 %
PLATELETS: 264 10*3/uL (ref 150–379)
RBC: 4.65 x10E6/uL (ref 3.77–5.28)
RDW: 13.7 % (ref 12.3–15.4)
WBC: 5.3 10*3/uL (ref 3.4–10.8)

## 2016-06-10 LAB — LIPID PANEL
CHOLESTEROL TOTAL: 201 mg/dL — AB (ref 100–199)
Chol/HDL Ratio: 3 ratio units (ref 0.0–4.4)
HDL: 68 mg/dL (ref >39)
LDL CALC: 118 mg/dL — AB (ref 0–99)
Triglycerides: 76 mg/dL (ref 0–149)
VLDL CHOLESTEROL CAL: 15 mg/dL (ref 5–40)

## 2016-06-10 LAB — HEPATIC FUNCTION PANEL
ALK PHOS: 36 IU/L — AB (ref 39–117)
ALT: 12 IU/L (ref 0–32)
AST: 21 IU/L (ref 0–40)
Albumin: 4.3 g/dL (ref 3.5–4.8)
BILIRUBIN, DIRECT: 0.16 mg/dL (ref 0.00–0.40)
Bilirubin Total: 0.5 mg/dL (ref 0.0–1.2)
Total Protein: 7 g/dL (ref 6.0–8.5)

## 2016-06-10 LAB — VITAMIN D 25 HYDROXY (VIT D DEFICIENCY, FRACTURES): Vit D, 25-Hydroxy: 44 ng/mL (ref 30.0–100.0)

## 2016-06-14 ENCOUNTER — Ambulatory Visit: Payer: Medicare Other | Admitting: Family Medicine

## 2016-06-21 ENCOUNTER — Other Ambulatory Visit: Payer: Medicare Other

## 2016-06-21 DIAGNOSIS — Z1212 Encounter for screening for malignant neoplasm of rectum: Secondary | ICD-10-CM | POA: Diagnosis not present

## 2016-06-24 LAB — FECAL OCCULT BLOOD, IMMUNOCHEMICAL: Fecal Occult Bld: NEGATIVE

## 2016-07-05 ENCOUNTER — Ambulatory Visit (INDEPENDENT_AMBULATORY_CARE_PROVIDER_SITE_OTHER): Payer: Medicare Other | Admitting: Internal Medicine

## 2016-07-05 ENCOUNTER — Encounter (INDEPENDENT_AMBULATORY_CARE_PROVIDER_SITE_OTHER): Payer: Self-pay | Admitting: Internal Medicine

## 2016-07-05 DIAGNOSIS — K5100 Ulcerative (chronic) pancolitis without complications: Secondary | ICD-10-CM | POA: Diagnosis not present

## 2016-07-05 DIAGNOSIS — K2100 Gastro-esophageal reflux disease with esophagitis, without bleeding: Secondary | ICD-10-CM

## 2016-07-05 MED ORDER — MESALAMINE 800 MG PO TBEC: DELAYED_RELEASE_TABLET | ORAL | 3 refills | Status: DC

## 2016-07-05 MED ORDER — DOCUSATE SODIUM 100 MG PO CAPS: 100.0000 mg | ORAL_CAPSULE | Freq: Two times a day (BID) | ORAL | 0 refills | Status: DC

## 2016-07-05 NOTE — Patient Instructions (Signed)
Continue high fiber diet. Colace 200 mg by mouth daily at bedtime. Call if this combination does not ameliorate constipation.

## 2016-07-05 NOTE — Progress Notes (Signed)
Presenting complaint;  Follow-up for ulcerative colitis and GERD.  Database and Subjective:  Patient is 81 year old Caucasian female who has chronic ulcerative colitis and GERD complicated by esophageal stricture who presents for scheduled visit. She was last seen 6 months ago. She had EGD with ED and colonoscopy 1 year ago. He was noted to have esophagitis and stricture at GE junction which was dilated. She was felt to have moderate size hiatal hernia. Patient states she had routine blood work by Dr. Laurance Flatten and was normal. She also had chest film on 06/09/2016 and was told that she had large hiatal hernia. She believes hernia has increased in size because she has lost height.  This is heartburn is well controlled with therapy. She denies dysphagia. She is also not having nausea vomiting. She feels PPIs working. She states she developed constipation and decided to back off on oral mesalamine but it did not help. She is trying to control her bowels by eating or fruits and wedge tables. At times she feels as if she needs to have a bowel movement but she has no results. She denies melena or rectal bleeding.    Current Medications: Outpatient Encounter Prescriptions as of 07/05/2016  Medication Sig  . acetaminophen (TYLENOL 8 HOUR ARTHRITIS PAIN) 650 MG CR tablet Take 650 mg by mouth. Patient states that she takes prn for arthritis in her knees.  . ASACOL HD 800 MG TBEC TAKE 2 TABLETS TWICE A DAY (Patient taking differently: take 2 tabs QD)  . Bromelains 500 MG TABS Take 500 mg by mouth daily.  . Calcium Carbonate-Vitamin D (CALCIUM 600+D) 600-400 MG-UNIT per tablet Take 1 tablet by mouth every evening.  [name] 500 MG CAPS Take 1 capsule by mouth daily.   . Coenzyme Q10 (CO Q-10 PO) Take 1 tablet by mouth daily. With red yeast rice [employer] Brand  . COLLAGEN PO Take 1 tablet by mouth daily. This is known as 1-2-3  . Cyanocobalamin (VITAMIN B 12 PO) Take 1,000 mcg by mouth daily.   [name]  estradiol (ESTRACE) 1 MG tablet Take 1 mg by mouth every other day.   . Ginkgo Biloba 40 MG TABS Take 40 mg by mouth daily.   [name] glucosamine-chondroitin 500-400 MG tablet Take 1 tablet by mouth 2 (two) times daily.    [name] Hyaluronic Acid-Vitamin C (HYALURONIC ACID PO) Take 80 mg by mouth daily.  [name] MILK THISTLE PO Take 100 mg by mouth daily.  . Misc Natural Products (BUTCHERS BROOM PO) Take 1 tablet by mouth daily.   . MULTIPLE VITAMINS PO Take 1 tablet by mouth daily. In the mornings  . Nutritional Supplements (GRAPESEED EXTRACT PO) Take 60 mg by mouth daily.   [name] OAT BRAN SOLUBLE PO Take 1 tablet by mouth daily.  [name] OVER THE COUNTER MEDICATION Take 66 mg by mouth daily. Vision Gold Lutein  . OVER THE COUNTER MEDICATION Take 250 mg by mouth daily. Alphalipoic Acid 250 mg daily  . pantoprazole (PROTONIX) 40 MG tablet Take 1 tablet (40 mg total) by mouth daily before breakfast.  . POTASSIUM GLUCONATE PO Take 99 mg by mouth daily. This is Chelated Potassium -Puritans Pride  . pyridOXINE (VITAMIN B-6) 100 MG tablet Take 100 mg by mouth daily.   . Turmeric 500 MG CAPS Take 1 capsule by mouth 2 (two) times daily.   . Vitamins C E (CRANBERRY CONCENTRATE PO) Take 2,500 mg by mouth daily.   . Zinc 50 MG CAPS Take 50 mg by  mouth daily.   . [DISCONTINUED] Crisaborole (EUCRISA) 2 % OINT Apply 1 drop topically daily. (Patient not taking: Reported on 07/05/2016)  . [DISCONTINUED] mupirocin ointment (BACTROBAN) 2 % Place 1 application into the nose 2 (two) times daily. (Patient not taking: Reported on 06/09/2016)   No facility-administered encounter medications on file as of 07/05/2016.      Objective: Blood pressure 140/74, pulse 68, temperature 97.8 F (36.6 C), temperature source Oral, resp. rate 18, height 5' 2"  (1.575 m), weight 139 lb (63 kg). Patient is alert and in no acute distress. Conjunctiva is pink. Sclera is nonicteric Oropharyngeal mucosa is normal. No neck masses or thyromegaly noted. Cardiac  exam with regular rhythm normal S1 and S2. No murmur or gallop noted. Lungs are clear to auscultation. Abdomen is symmetrical soft and nontender without organomegaly or masses.  No LE edema or clubbing noted.  Labs/studies Results:   Chest film from 06/09/2016 reviewed. She has large hiatal hernia.  Lab data from 06/09/2016  WBC 5.3, H&H 18.5 and 41.4 and platelet count 264K. BUN 9 and creatinine 0.67 Serum calcium 10.0. Bilirubin 0.5, AP 36, AST 21, ALT 12, total protein 7.0 and albumin of 4.3.  Assessment:  #1. Chronic ulcerative colitis. She underwent surveillance colonoscopy in March last year and documented to be in remission. He should not decrease oral mesalamine dose as it would increase her risk of relapse. I do not believe constipation is secondary to this medication. #2. Chronic GERD complicated by distal esophageal stricture. I agree hiatal hernia may be increased in size is of loss of height. However symptoms are well controlled and I would not recommend anti-reflux surgery.   Plan:  Increase Asacol HD dose to 1600 mg by mouth daily. High fiber diet. Colace 200 mg by mouth daily at bedtime. Patient will call office if above therapy does not ameliorate constipation. Office visit in 6 months.

## 2016-07-06 ENCOUNTER — Other Ambulatory Visit (INDEPENDENT_AMBULATORY_CARE_PROVIDER_SITE_OTHER): Payer: Self-pay | Admitting: *Deleted

## 2016-07-06 DIAGNOSIS — K5120 Ulcerative (chronic) proctitis without complications: Secondary | ICD-10-CM

## 2016-07-06 MED ORDER — MESALAMINE 800 MG PO TBEC: DELAYED_RELEASE_TABLET | ORAL | 3 refills | Status: DC

## 2016-08-17 DIAGNOSIS — G5601 Carpal tunnel syndrome, right upper limb: Secondary | ICD-10-CM | POA: Diagnosis not present

## 2016-08-17 DIAGNOSIS — M180 Bilateral primary osteoarthritis of first carpometacarpal joints: Secondary | ICD-10-CM | POA: Diagnosis not present

## 2016-08-18 DIAGNOSIS — M9905 Segmental and somatic dysfunction of pelvic region: Secondary | ICD-10-CM | POA: Diagnosis not present

## 2016-08-18 DIAGNOSIS — M5137 Other intervertebral disc degeneration, lumbosacral region: Secondary | ICD-10-CM | POA: Diagnosis not present

## 2016-08-31 ENCOUNTER — Telehealth: Payer: Self-pay | Admitting: Family Medicine

## 2016-08-31 DIAGNOSIS — M546 Pain in thoracic spine: Secondary | ICD-10-CM

## 2016-08-31 DIAGNOSIS — M419 Scoliosis, unspecified: Secondary | ICD-10-CM

## 2016-08-31 NOTE — Telephone Encounter (Signed)
Patient wants to have a referral for physical therapy to help with her back. She would like to go next door.  She hopes exercising will not bother her hiatel hernia. Please advise if therapy will tell her other exercises to do or will you give her suggestions to help with the weight around her stomach?

## 2016-08-31 NOTE — Telephone Encounter (Signed)
Please do a referral for physical therapy. Please let them know that she has severe scoliosis of the thoracic lumbar spine. The scoliosis is aggravating the appearance of her abdominal area. There may not be a lot and we can do about that appearance. Hopefully with physical therapy they can help strengthen her back so that she is less apt to fall and create more problems.

## 2016-09-01 NOTE — Telephone Encounter (Signed)
Referral placed and pt was left a detailed message that pt with arrange and call her

## 2016-09-06 ENCOUNTER — Ambulatory Visit: Payer: Medicare Other | Admitting: Physical Therapy

## 2016-09-07 ENCOUNTER — Ambulatory Visit: Payer: Medicare Other | Attending: Family Medicine | Admitting: Physical Therapy

## 2016-09-07 DIAGNOSIS — R293 Abnormal posture: Secondary | ICD-10-CM

## 2016-09-07 DIAGNOSIS — G8929 Other chronic pain: Secondary | ICD-10-CM | POA: Insufficient documentation

## 2016-09-07 DIAGNOSIS — M546 Pain in thoracic spine: Secondary | ICD-10-CM | POA: Insufficient documentation

## 2016-09-07 DIAGNOSIS — M545 Low back pain: Secondary | ICD-10-CM | POA: Diagnosis not present

## 2016-09-07 NOTE — Therapy (Signed)
[facility] [place], [place], [address] Phone: [phone]   Fax:  [phone]  Physical Therapy Evaluation  Patient Details  Name: Lori Soto MRN: [id number] Date of Birth: 01-04-1936 Referring Provider: Rosana Berger MD  Encounter Date: 09/07/2016      PT End of Session - 09/07/16 2021    Visit Number 1   Number of Visits 16   Date for PT Re-Evaluation 11/06/16   PT Start Time 1117   PT Stop Time 1207   PT Time Calculation (min) 50 min   Activity Tolerance Patient tolerated treatment well   Behavior During Therapy [facility] for tasks assessed/performed      Past Medical History:  Diagnosis Date  . Anemia   . Cancer ([place])    skin cancer on nose  . Carpal tunnel syndrome, bilateral   . Cataract   . Esophagitis   . GERD (gastroesophageal reflux disease)   . H/O hiatal hernia   . Osteopenia   . Scoliosis   . Shingles   . Shingles   . Ulcerative colitis     Past Surgical History:  Procedure Laterality Date  . ABDOMINAL HYSTERECTOMY    . BUNIONECTOMY     Bilateral  . CARPAL TUNNEL RELEASE Left 03/10/2015   Procedure: LEFT CARPAL TUNNEL RELEASE;  Surgeon: Daryll Brod, MD;  Location: [facility];  Service: Orthopedics;  Laterality: Left;  . CATARACT EXTRACTION W/PHACO  05/28/2012   Procedure: CATARACT EXTRACTION PHACO AND INTRAOCULAR LENS PLACEMENT (IOC);  Surgeon: Tonny Branch, MD;  [facility];  Service: Ophthalmology;  Laterality: Right;  CDE=12.84  . CATARACT EXTRACTION W/PHACO  06/07/2012   Procedure: CATARACT EXTRACTION PHACO AND INTRAOCULAR LENS PLACEMENT (IOC);  Surgeon: Tonny Branch, MD;  [facility];  Service: Ophthalmology;  Laterality: Left;  CDE 17.60  . COLONOSCOPY    . COLONOSCOPY N/A 07/08/2015   Procedure: COLONOSCOPY;  Surgeon: Rogene Houston, MD;  [facility];  Service: Endoscopy;  Laterality: N/A;  930  . ESOPHAGEAL DILATION N/A 07/08/2015   Procedure: ESOPHAGEAL  DILATION;  Surgeon: Rogene Houston, MD;  [facility];  Service: Endoscopy;  Laterality: N/A;  . ESOPHAGOGASTRODUODENOSCOPY N/A 07/08/2015   Procedure: ESOPHAGOGASTRODUODENOSCOPY (EGD);  Surgeon: Rogene Houston, MD;  [facility];  Service: Endoscopy;  Laterality: N/A;  . Hernia     Right inguinal  . HERNIA REPAIR  1961   right inguinal hernia  . UPPER GASTROINTESTINAL ENDOSCOPY      There were no vitals filed for this visit.       Subjective Assessment - 09/07/16 2041    Pertinent History Thoracic and lumbar scoliosis.  Small umbilical hernia that she has had for years.            [facility] PT Assessment - 09/07/16 0001      Assessment   Medical Diagnosis Thoracic scoloisis.   Referring Provider Rosana Berger MD     Precautions   Precautions None     Restrictions   Weight Bearing Restrictions No     Balance Screen   Has the patient fallen in the past 6 months No   Has the patient had a decrease in activity level because of a fear of falling?  No   Is the patient reluctant to leave their home because of a fear of falling?  No     Home Ec[employer] residence     Prior  Function   Level of Independence Independent     Posture/Postural Control   Posture/Postural Control Postural limitations   Postural Limitations Rounded Shoulders;Forward head;Decreased lumbar lordosis;Flexed trunk   Posture Comments Scolosis with convexity on left in thoracic spine and convexity to right in lumbar spine.     ROM / Strength   AROM / PROM / Strength AROM;Strength     AROM   Overall AROM Comments Active lumbar extension= 12 degrees and fingertip to floor measurement is full range.     Strength   Overall Strength Comments essentially normal bilateral LE strength.       Palpation   Palpation comment Diffuse c/o thoracic pain.  The patient is extremely taut to palpation over her right erector spinae and QL musculature.     Special Tests     Special Tests --  (-) SLR testing.  (=) leg lengths.     Ambulation/Gait   Gait Comments the patient walks in some forward flexion and sidebending.                   [facility] Adult PT Treatment/Exercise - 09/07/16 0001      Modalities   Modalities Traction     Traction   Type of Traction Lumbar   Min (lbs) 5   Max (lbs) 70   Hold Time 99   Rest Time 5   Time 15                  PT Short Term Goals - 09/07/16 28-Jun-2043      PT SHORT TERM GOAL #1   Title Ind with a HEP.   Time 2   Period Weeks   Status New           PT Long Term Goals - 09/07/16 06/28/2043      PT LONG TERM GOAL #1   Title Perform ADL's with pain not > 3/10.   Time 8   Period Weeks   Status New     PT LONG TERM GOAL #2   Title Stand 45 minutes with pain not > 3-4/10.   Time 8   Period Weeks   Status New               Plan - 09/07/16 Jun 27, 2049    Clinical Impression Statement The patient has a long h/o spinal pain that has been evoving and worsening.  She has severe postural abnormalities due to scoliosis.  Her impairments limite her ability to perform her ADL's due to at times very high pain-levels.  Patient will benefit from skilled physical therapy.   Rehab Potential Good   PT Frequency 2x / week   PT Duration 8 weeks   PT Treatment/Interventions ADLs/Self Care Home Management;Cryotherapy;Electrical Stimulation;Ultrasound;Traction;Moist Heat;Therapeutic activities;Therapeutic exercise;Manual techniques;Patient/family education;Passive range of motion   PT Next Visit Plan STW/M and dry needling; int lumbar traction; modalities as needed.   Consulted and Agree with Plan of Care Patient      Patient will benefit from skilled therapeutic intervention in order to improve the following deficits and impairments:  Decreased activity tolerance, Decreased range of motion, Pain, Postural dysfunction  Visit Diagnosis: Chronic bilateral low back pain without sciatica - Plan: PT plan of  care cert/re-cert  Pain in thoracic spine - Plan: PT plan of care cert/re-cert  Abnormal posture - Plan: PT plan of care cert/re-cert      G-Codes - 96/22/29 06-27-44    Functional Assessment Tool Used (Outpatient Only) FOTO...64% limitation.  Functional Limitation Mobility: Walking and moving around   Mobility: Walking and Moving Around Current Status [phone]) At least 60 percent but less than 80 percent impaired, limited or restricted   Mobility: Walking and Moving Around Goal Status [phone]) At least 40 percent but less than 60 percent impaired, limited or restricted       Problem List Patient Active Problem List   Diagnosis Date Noted  . Osteopenia of the elderly 02/19/2014  . DDD (degenerative disc disease), lumbar 12/03/2013  . Scoliosis 12/03/2013  . Chronic LBP 05/14/2013  . IBS (irritable bowel syndrome) 11/08/2012  . UC (ulcerative colitis) ([place]) 04/23/2012  . Palpitation 10/26/2011  . Hypertension 10/26/2011  . Chronic ulcerative colitis ([place]) 03/23/2011    Lori Soto, Lori Soto 09/07/2016, 8:59 PM  [facility] [address] [place], [place], [address] Phone: [phone]   Fax:  [phone]  Name: Lori Soto MRN: [id number] Date of Birth: 1935-07-04

## 2016-09-14 ENCOUNTER — Encounter: Payer: Self-pay | Admitting: Physical Therapy

## 2016-09-14 ENCOUNTER — Ambulatory Visit: Payer: Medicare Other | Admitting: Physical Therapy

## 2016-09-14 DIAGNOSIS — G8929 Other chronic pain: Secondary | ICD-10-CM

## 2016-09-14 DIAGNOSIS — M546 Pain in thoracic spine: Secondary | ICD-10-CM | POA: Diagnosis not present

## 2016-09-14 DIAGNOSIS — M545 Low back pain: Secondary | ICD-10-CM | POA: Diagnosis not present

## 2016-09-14 DIAGNOSIS — R293 Abnormal posture: Secondary | ICD-10-CM

## 2016-09-14 NOTE — Therapy (Signed)
[facility] [place], [place], [address] Phone: [phone]   Fax:  [phone]  Physical Therapy Treatment  Patient Details  Name: Lori Soto MRN: [id number] Date of Birth: 10/29/1935 Referring Provider: Rosana Berger MD  Encounter Date: 09/14/2016      PT End of Session - 09/14/16 1350    Visit Number 2   Number of Visits 16   Date for PT Re-Evaluation 11/06/16   PT Start Time 1350   PT Stop Time 1439   PT Time Calculation (min) 49 min   Activity Tolerance Patient tolerated treatment well   Behavior During Therapy [facility] for tasks assessed/performed      Past Medical History:  Diagnosis Date  . Anemia   . Cancer ([place])    skin cancer on nose  . Carpal tunnel syndrome, bilateral   . Cataract   . Esophagitis   . GERD (gastroesophageal reflux disease)   . H/O hiatal hernia   . Osteopenia   . Scoliosis   . Shingles   . Shingles   . Ulcerative colitis     Past Surgical History:  Procedure Laterality Date  . ABDOMINAL HYSTERECTOMY    . BUNIONECTOMY     Bilateral  . CARPAL TUNNEL RELEASE Left 03/10/2015   Procedure: LEFT CARPAL TUNNEL RELEASE;  Surgeon: Daryll Brod, MD;  Location: [facility];  Service: Orthopedics;  Laterality: Left;  . CATARACT EXTRACTION W/PHACO  05/28/2012   Procedure: CATARACT EXTRACTION PHACO AND INTRAOCULAR LENS PLACEMENT (IOC);  Surgeon: Tonny Branch, MD;  [facility];  Service: Ophthalmology;  Laterality: Right;  CDE=12.84  . CATARACT EXTRACTION W/PHACO  06/07/2012   Procedure: CATARACT EXTRACTION PHACO AND INTRAOCULAR LENS PLACEMENT (IOC);  Surgeon: Tonny Branch, MD;  [facility];  Service: Ophthalmology;  Laterality: Left;  CDE 17.60  . COLONOSCOPY    . COLONOSCOPY N/A 07/08/2015   Procedure: COLONOSCOPY;  Surgeon: Rogene Houston, MD;  [facility];  Service: Endoscopy;  Laterality: N/A;  930  . ESOPHAGEAL DILATION N/A 07/08/2015   Procedure: ESOPHAGEAL  DILATION;  Surgeon: Rogene Houston, MD;  [facility];  Service: Endoscopy;  Laterality: N/A;  . ESOPHAGOGASTRODUODENOSCOPY N/A 07/08/2015   Procedure: ESOPHAGOGASTRODUODENOSCOPY (EGD);  Surgeon: Rogene Houston, MD;  [facility];  Service: Endoscopy;  Laterality: N/A;  . Hernia     Right inguinal  . HERNIA REPAIR  1961   right inguinal hernia  . UPPER GASTROINTESTINAL ENDOSCOPY      There were no vitals filed for this visit.      Subjective Assessment - 09/14/16 1349    Subjective Reports that she has been running errands this morning so pain has increased. Has been gardening but her tire raised beds are at waist height.   Pertinent History Thoracic and lumbar scoliosis.  Small umbilical hernia that she has had for years.   Patient Stated Goals Reduce my pain.   Currently in Pain? Yes   Pain Score 9    Pain Location Back   Pain Orientation Right;Left;Lower   Pain Type Chronic pain   Pain Onset More than a month ago            [facility] PT Assessment - 09/14/16 0001      Assessment   Medical Diagnosis Thoracic scoloisis.     Precautions   Precautions None     Restrictions   Weight Bearing Restrictions No  [facility] Adult PT Treatment/Exercise - 09/14/16 0001      Modalities   Modalities Ultrasound;Traction     Ultrasound   Ultrasound Location R low back/ glute   Ultrasound Parameters 1.5 w/cm2, 100%, 1 mhz x10 min   Ultrasound Goals Pain     Traction   Type of Traction Lumbar   Min (lbs) 5   Max (lbs) 70   Hold Time 99   Rest Time 5   Time 15     Manual Therapy   Manual Therapy Soft tissue mobilization   Soft tissue mobilization STW to R low back/ glute to reduce tone and pain                   PT Short Term Goals - 09/07/16 2045      PT SHORT TERM GOAL #1   Title Ind with a HEP.   Time 2   Period Weeks   Status New           PT Long Term Goals - 09/07/16 2045      PT LONG TERM  GOAL #1   Title Perform ADL's with pain not > 3/10.   Time 8   Period Weeks   Status New     PT LONG TERM GOAL #2   Title Stand 45 minutes with pain not > 3-4/10.   Time 8   Period Weeks   Status New               Plan - 09/14/16 1439    Clinical Impression Statement Patient presented in clinic with reports of increased LBP secondary to increased activity and worsening scoliosis per patient report. Tightness palpated throughout R low back and glute region today. Lumbar traction initiated at 70# per MPT discretion with normal response from patient following treatment.   Rehab Potential Good   PT Frequency 2x / week   PT Duration 8 weeks   PT Treatment/Interventions ADLs/Self Care Home Management;Cryotherapy;Electrical Stimulation;Ultrasound;Traction;Moist Heat;Therapeutic activities;Therapeutic exercise;Manual techniques;Patient/family education;Passive range of motion   PT Next Visit Plan Continue with manual therapy and lumbar traction per MPT POC. Provide HEP for core/lumbar exercises in coming visits.   Consulted and Agree with Plan of Care Patient      Patient will benefit from skilled therapeutic intervention in order to improve the following deficits and impairments:  Decreased activity tolerance, Decreased range of motion, Pain, Postural dysfunction  Visit Diagnosis: Chronic bilateral low back pain without sciatica  Pain in thoracic spine  Abnormal posture     Problem List Patient Active Problem List   Diagnosis Date Noted  . Osteopenia of the elderly 02/19/2014  . DDD (degenerative disc disease), lumbar 12/03/2013  . Scoliosis 12/03/2013  . Chronic LBP 05/14/2013  . IBS (irritable bowel syndrome) 11/08/2012  . UC (ulcerative colitis) ([place]) 04/23/2012  . Palpitation 10/26/2011  . Hypertension 10/26/2011  . Chronic ulcerative colitis ([place]) 03/23/2011    Wynelle Fanny, PTA 09/14/2016, 2:47 PM  [facility] [place], [place], [address] Phone: [phone]   Fax:  [phone]  Name: Lori Soto MRN: [id number] Date of Birth: Dec 23, 1935

## 2016-09-19 ENCOUNTER — Encounter: Payer: Self-pay | Admitting: Physical Therapy

## 2016-09-19 ENCOUNTER — Ambulatory Visit: Payer: Medicare Other | Admitting: Physical Therapy

## 2016-09-19 DIAGNOSIS — M546 Pain in thoracic spine: Secondary | ICD-10-CM

## 2016-09-19 DIAGNOSIS — G8929 Other chronic pain: Secondary | ICD-10-CM | POA: Diagnosis not present

## 2016-09-19 DIAGNOSIS — R293 Abnormal posture: Secondary | ICD-10-CM

## 2016-09-19 DIAGNOSIS — M545 Low back pain: Principal | ICD-10-CM

## 2016-09-19 NOTE — Therapy (Signed)
[facility] [place], [place], [address] Phone: [phone]   Fax:  [phone]  Physical Therapy Treatment  Patient Details  Name: Lori Soto MRN: [id number] Date of Birth: [birth year] Referring Provider: Rosana Berger MD  Encounter Date: 09/19/2016      PT End of Session - 09/19/16 1356    Visit Number 3   Number of Visits 16   Date for PT Re-Evaluation 11/06/16   PT Start Time 3220   PT Stop Time 1436   PT Time Calculation (min) 40 min   Activity Tolerance Patient tolerated treatment well   Behavior During Therapy [facility] for tasks assessed/performed      Past Medical History:  Diagnosis Date  . Anemia   . Cancer ([place])    skin cancer on nose  . Carpal tunnel syndrome, bilateral   . Cataract   . Esophagitis   . GERD (gastroesophageal reflux disease)   . H/O hiatal hernia   . Osteopenia   . Scoliosis   . Shingles   . Shingles   . Ulcerative colitis     Past Surgical History:  Procedure Laterality Date  . ABDOMINAL HYSTERECTOMY    . BUNIONECTOMY     Bilateral  . CARPAL TUNNEL RELEASE Left 03/10/2015   Procedure: LEFT CARPAL TUNNEL RELEASE;  Surgeon: Daryll Brod, MD;  Location: [facility];  Service: Orthopedics;  Laterality: Left;  . CATARACT EXTRACTION W/PHACO  05/28/2012   Procedure: CATARACT EXTRACTION PHACO AND INTRAOCULAR LENS PLACEMENT (IOC);  Surgeon: Tonny Branch, MD;  [facility];  Service: Ophthalmology;  Laterality: Right;  CDE=12.84  . CATARACT EXTRACTION W/PHACO  06/07/2012   Procedure: CATARACT EXTRACTION PHACO AND INTRAOCULAR LENS PLACEMENT (IOC);  Surgeon: Tonny Branch, MD;  [facility];  Service: Ophthalmology;  Laterality: Left;  CDE 17.60  . COLONOSCOPY    . COLONOSCOPY N/A 07/08/2015   Procedure: COLONOSCOPY;  Surgeon: Rogene Houston, MD;  [facility];  Service: Endoscopy;  Laterality: N/A;  930  . ESOPHAGEAL DILATION N/A 07/08/2015   Procedure: ESOPHAGEAL  DILATION;  Surgeon: Rogene Houston, MD;  [facility];  Service: Endoscopy;  Laterality: N/A;  . ESOPHAGOGASTRODUODENOSCOPY N/A 07/08/2015   Procedure: ESOPHAGOGASTRODUODENOSCOPY (EGD);  Surgeon: Rogene Houston, MD;  [facility];  Service: Endoscopy;  Laterality: N/A;  . Hernia     Right inguinal  . HERNIA REPAIR  1961   right inguinal hernia  . UPPER GASTROINTESTINAL ENDOSCOPY      There were no vitals filed for this visit.      Subjective Assessment - 09/19/16 1355    Subjective Reports that she rested yesterday afternoon and did not do anything much this morning. Reports that she woke with LBP.   Pertinent History Thoracic and lumbar scoliosis.  Small umbilical hernia that she has had for years.   Patient Stated Goals Reduce my pain.   Currently in Pain? Yes   Pain Score 10-Worst pain ever   Pain Location Back   Pain Orientation Right;Left;Lower   Pain Descriptors / Indicators Discomfort;Constant   Pain Type Chronic pain   Pain Onset More than a month ago   Pain Frequency Constant            [facility] PT Assessment - 09/19/16 0001      Assessment   Medical Diagnosis Thoracic scoloisis.     Precautions   Precautions None     Restrictions   Weight Bearing Restrictions  No                     [facility] Adult PT Treatment/Exercise - 09/19/16 0001      Modalities   Modalities Ultrasound;Traction     Ultrasound   Ultrasound Location R low back/ glute   Ultrasound Parameters 1.5 w/cm2, 100%,1 mhz x10 min   Ultrasound Goals Pain     Traction   Type of Traction Lumbar   Min (lbs) 5   Max (lbs) 70   Hold Time 99   Rest Time 5   Time 15     Manual Therapy   Manual Therapy Soft tissue mobilization   Soft tissue mobilization STW to R low back/ glute to reduce tone and pain                   PT Short Term Goals - 09/07/16 2045      PT SHORT TERM GOAL #1   Title Ind with a HEP.   Time 2   Period Weeks   Status New            PT Long Term Goals - 09/07/16 2045      PT LONG TERM GOAL #1   Title Perform ADL's with pain not > 3/10.   Time 8   Period Weeks   Status New     PT LONG TERM GOAL #2   Title Stand 45 minutes with pain not > 3-4/10.   Time 8   Period Weeks   Status New               Plan - 09/19/16 1427    Clinical Impression Statement Patient presented in clinic with increased low back pain for unknown reason as she has not been completing errands. Patient able to tolerate treatment and STW well with no reports of any pain or tenderness from patient. Patient experienced relief of LBP per patient report following [place] and manual therapy. Traction maintained at 70# today.    Rehab Potential Good   PT Frequency 2x / week   PT Duration 8 weeks   PT Treatment/Interventions ADLs/Self Care Home Management;Cryotherapy;Electrical Stimulation;Ultrasound;Traction;Moist Heat;Therapeutic activities;Therapeutic exercise;Manual techniques;Patient/family education;Passive range of motion   PT Next Visit Plan Continue with manual therapy and lumbar traction per MPT POC. Provide HEP for core/lumbar exercises in coming visits.   Consulted and Agree with Plan of Care Patient      Patient will benefit from skilled therapeutic intervention in order to improve the following deficits and impairments:  Decreased activity tolerance, Decreased range of motion, Pain, Postural dysfunction  Visit Diagnosis: Chronic bilateral low back pain without sciatica  Pain in thoracic spine  Abnormal posture     Problem List Patient Active Problem List   Diagnosis Date Noted  . Osteopenia of the elderly 02/19/2014  . DDD (degenerative disc disease), lumbar 12/03/2013  . Scoliosis 12/03/2013  . Chronic LBP 05/14/2013  . IBS (irritable bowel syndrome) 11/08/2012  . UC (ulcerative colitis) ([place]) 04/23/2012  . Palpitation 10/26/2011  . Hypertension 10/26/2011  . Chronic ulcerative colitis ([place]) 03/23/2011     Wynelle Fanny, PTA 09/19/2016, 3:11 PM  [facility] [address] [place], [place], [address] Phone: [phone]   Fax:  [phone]  Name: Lori Soto MRN: [id number] Date of Birth: 1935-07-16

## 2016-09-21 ENCOUNTER — Encounter: Payer: Self-pay | Admitting: Physical Therapy

## 2016-09-21 ENCOUNTER — Ambulatory Visit: Payer: Medicare Other | Admitting: Physical Therapy

## 2016-09-21 DIAGNOSIS — M545 Low back pain, unspecified: Secondary | ICD-10-CM

## 2016-09-21 DIAGNOSIS — R293 Abnormal posture: Secondary | ICD-10-CM

## 2016-09-21 DIAGNOSIS — M546 Pain in thoracic spine: Secondary | ICD-10-CM

## 2016-09-21 DIAGNOSIS — G8929 Other chronic pain: Secondary | ICD-10-CM | POA: Diagnosis not present

## 2016-09-21 NOTE — Patient Instructions (Addendum)
Scapular Retraction: Abduction (Standing)    With arms elevated and elbows bent to 90, pinch shoulder blades together and press arms back. Repeat _10___ times per set. Do __2-3__ sets per session. Do ___2-4_ sessions per day.   ROM: Posterior Glide    Shift body weight down between arms until stretch is felt. Hold __5__ seconds. Repeat __5__ times per set. Do __1__ sets per session. Do __1__ sessions per day.   Wall Angel   Begin standing against the wall. Aim to make contact with your wrists, elbows, and shoulders .   Slowly sweep your arms overhead against the wall as if you're making a snow angel. Try to keep your shoulders down as you raise your arms up. x5 3xday  Pelvic Tilt: Posterior - Legs Bent (Supine)   Tighten stomach and flatten back by rolling pelvis down. Hold _10___ seconds. Relax. Repeat _10-30___ times per set. Do __2__ sets per session. Do _2___ sessions per day.

## 2016-09-21 NOTE — Therapy (Signed)
[facility] [place], [place], [address] Phone: [phone]   Fax:  [phone]  Physical Therapy Treatment  Patient Details  Name: Lori Soto MRN: [id number] Date of Birth: [birth year] Referring Provider: Rosana Berger MD  Encounter Date: 09/21/2016      PT End of Session - 09/21/16 1459    Visit Number 4   Number of Visits 16   Date for PT Re-Evaluation 11/06/16   PT Start Time 1749   PT Stop Time 1530   PT Time Calculation (min) 39 min   Activity Tolerance Patient tolerated treatment well   Behavior During Therapy [facility] for tasks assessed/performed      Past Medical History:  Diagnosis Date  . Anemia   . Cancer ([place])    skin cancer on nose  . Carpal tunnel syndrome, bilateral   . Cataract   . Esophagitis   . GERD (gastroesophageal reflux disease)   . H/O hiatal hernia   . Osteopenia   . Scoliosis   . Shingles   . Shingles   . Ulcerative colitis     Past Surgical History:  Procedure Laterality Date  . ABDOMINAL HYSTERECTOMY    . BUNIONECTOMY     Bilateral  . CARPAL TUNNEL RELEASE Left 03/10/2015   Procedure: LEFT CARPAL TUNNEL RELEASE;  Surgeon: Daryll Brod, MD;  Location: [place];  Service: Orthopedics;  Laterality: Left;  . CATARACT EXTRACTION W/PHACO  05/28/2012   Procedure: CATARACT EXTRACTION PHACO AND INTRAOCULAR LENS PLACEMENT (IOC);  Surgeon: Tonny Branch, MD;  [facility];  Service: Ophthalmology;  Laterality: Right;  CDE=12.84  . CATARACT EXTRACTION W/PHACO  06/07/2012   Procedure: CATARACT EXTRACTION PHACO AND INTRAOCULAR LENS PLACEMENT (IOC);  Surgeon: Tonny Branch, MD;  [facility];  Service: Ophthalmology;  Laterality: Left;  CDE 17.60  . COLONOSCOPY    . COLONOSCOPY N/A 07/08/2015   Procedure: COLONOSCOPY;  Surgeon: Rogene Houston, MD;  [facility];  Service: Endoscopy;  Laterality: N/A;  930  . ESOPHAGEAL DILATION N/A 07/08/2015   Procedure: ESOPHAGEAL  DILATION;  Surgeon: Rogene Houston, MD;  [facility];  Service: Endoscopy;  Laterality: N/A;  . ESOPHAGOGASTRODUODENOSCOPY N/A 07/08/2015   Procedure: ESOPHAGOGASTRODUODENOSCOPY (EGD);  Surgeon: Rogene Houston, MD;  [facility];  Service: Endoscopy;  Laterality: N/A;  . Hernia     Right inguinal  . HERNIA REPAIR  1961   right inguinal hernia  . UPPER GASTROINTESTINAL ENDOSCOPY      There were no vitals filed for this visit.      Subjective Assessment - 09/21/16 1455    Subjective Patient reported doing good after last treatment, yet temporary relief   Pertinent History Thoracic and lumbar scoliosis.  Small umbilical hernia that she has had for years.   Patient Stated Goals Reduce my pain.   Pain Score 10-Worst pain ever   Pain Location Back   Pain Orientation Right;Left;Lower   Pain Descriptors / Indicators Discomfort;Sore;Aching   Pain Type Chronic pain   Pain Onset More than a month ago   Pain Frequency Constant   Aggravating Factors  prolong activity   Pain Relieving Factors traction                         [facility] Adult PT Treatment/Exercise - 09/21/16 0001      Ultrasound   Ultrasound Location right low back/glut   Ultrasound Parameters 1.5w/cm2/50%/75mzx10min  Ultrasound Goals Pain     Traction   Type of Traction Lumbar   Min (lbs) 5   Max (lbs) 70   Hold Time 99   Rest Time 5   Time 15     Manual Therapy   Manual Therapy Soft tissue mobilization   Soft tissue mobilization STW to R low back/ glute to reduce tone and pain                 PT Education - 09/21/16 1520    Education provided Yes   Education Details HEP   Person(s) Educated Patient   Methods Demonstration;Explanation;Handout   Comprehension Verbalized understanding;Returned demonstration          PT Short Term Goals - 09/21/16 1520      PT SHORT TERM GOAL #1   Title Ind with a HEP.   Time 2   Period Weeks   Status Achieved            PT Long Term Goals - 09/21/16 1520      PT LONG TERM GOAL #1   Title Perform ADL's with pain not > 3/10.   Baseline 8/10 or more by lunch time   Time 8   Period Weeks   Status On-going     PT LONG TERM GOAL #2   Title Stand 45 minutes with pain not > 3-4/10.   Baseline able to make a pound cake without resting 7/10 pain   Time 8   Period Weeks   Status On-going     PT LONG TERM GOAL #3   Title Patient to report decreased B knee pain by 25% with ADLS   Period Weeks   Status On-going               Plan - 09/21/16 1520    Clinical Impression Statement Patient tolerated treatment well today. Patient had no colmplaints durning or after treatment. Patient had palpable pain in right low back glut area today. HEP given for core activation technique and upper back stretching to help decrease pain and forward flexed posture. Patient met STG #1 and LTG's ongoing due to pain deficts.    Rehab Potential Good   PT Frequency 2x / week   PT Duration 8 weeks   PT Treatment/Interventions ADLs/Self Care Home Management;Cryotherapy;Electrical Stimulation;Ultrasound;Traction;Moist Heat;Therapeutic activities;Therapeutic exercise;Manual techniques;Patient/family education;Passive range of motion   PT Next Visit Plan Continue with manual therapy and lumbar traction per MPT POC   Consulted and Agree with Plan of Care Patient      Patient will benefit from skilled therapeutic intervention in order to improve the following deficits and impairments:  Decreased activity tolerance, Decreased range of motion, Pain, Postural dysfunction  Visit Diagnosis: Chronic bilateral low back pain without sciatica  Pain in thoracic spine  Abnormal posture     Problem List Patient Active Problem List   Diagnosis Date Noted  . Osteopenia of the elderly 02/19/2014  . DDD (degenerative disc disease), lumbar 12/03/2013  . Scoliosis 12/03/2013  . Chronic LBP 05/14/2013  . IBS (irritable bowel  syndrome) 11/08/2012  . UC (ulcerative colitis) ([place]) 04/23/2012  . Palpitation 10/26/2011  . Hypertension 10/26/2011  . Chronic ulcerative colitis ([place]) 03/23/2011    Lori Soto, PTA 09/21/2016, 3:33 PM  [facility] [place], [place], [address] Phone: [phone]   Fax:  [phone]  Name: Lori Soto MRN: [id number] Date of Birth: Nov 25, 1935

## 2016-09-27 ENCOUNTER — Ambulatory Visit: Payer: Medicare Other | Admitting: Physical Therapy

## 2016-09-27 DIAGNOSIS — M545 Low back pain: Secondary | ICD-10-CM | POA: Diagnosis not present

## 2016-09-27 DIAGNOSIS — G8929 Other chronic pain: Secondary | ICD-10-CM

## 2016-09-27 DIAGNOSIS — M546 Pain in thoracic spine: Secondary | ICD-10-CM | POA: Diagnosis not present

## 2016-09-27 DIAGNOSIS — R293 Abnormal posture: Secondary | ICD-10-CM | POA: Diagnosis not present

## 2016-09-27 NOTE — Therapy (Signed)
[facility] [place], [place], [address] Phone: [phone]   Fax:  [phone]  Physical Therapy Treatment  Patient Details  Name: Lori Soto MRN: [id number] Date of Birth: [birth year] Referring Provider: Rosana Berger MD  Encounter Date: 09/27/2016      PT End of Session - 09/27/16 1558    Visit Number 5   Number of Visits 16   Date for PT Re-Evaluation 11/06/16   PT Start Time 0238   PT Stop Time 0325   PT Time Calculation (min) 47 min   Activity Tolerance Patient tolerated treatment well   Behavior During Therapy [facility] for tasks assessed/performed      Past Medical History:  Diagnosis Date  . Anemia   . Cancer ([place])    skin cancer on nose  . Carpal tunnel syndrome, bilateral   . Cataract   . Esophagitis   . GERD (gastroesophageal reflux disease)   . H/O hiatal hernia   . Osteopenia   . Scoliosis   . Shingles   . Shingles   . Ulcerative colitis     Past Surgical History:  Procedure Laterality Date  . ABDOMINAL HYSTERECTOMY    . BUNIONECTOMY     Bilateral  . CARPAL TUNNEL RELEASE Left 03/10/2015   Procedure: LEFT CARPAL TUNNEL RELEASE;  Surgeon: Daryll Brod, MD;  Location: [facility];  Service: Orthopedics;  Laterality: Left;  . CATARACT EXTRACTION W/PHACO  05/28/2012   Procedure: CATARACT EXTRACTION PHACO AND INTRAOCULAR LENS PLACEMENT (IOC);  Surgeon: Tonny Branch, MD;  [facility];  Service: Ophthalmology;  Laterality: Right;  CDE=12.84  . CATARACT EXTRACTION W/PHACO  06/07/2012   Procedure: CATARACT EXTRACTION PHACO AND INTRAOCULAR LENS PLACEMENT (IOC);  Surgeon: Tonny Branch, MD;  [facility];  Service: Ophthalmology;  Laterality: Left;  CDE 17.60  . COLONOSCOPY    . COLONOSCOPY N/A 07/08/2015   Procedure: COLONOSCOPY;  Surgeon: Rogene Houston, MD;  [facility];  Service: Endoscopy;  Laterality: N/A;  930  . ESOPHAGEAL DILATION N/A 07/08/2015   Procedure: ESOPHAGEAL  DILATION;  Surgeon: Rogene Houston, MD;  [facility];  Service: Endoscopy;  Laterality: N/A;  . ESOPHAGOGASTRODUODENOSCOPY N/A 07/08/2015   Procedure: ESOPHAGOGASTRODUODENOSCOPY (EGD);  Surgeon: Rogene Houston, MD;  [facility];  Service: Endoscopy;  Laterality: N/A;  . Hernia     Right inguinal  . HERNIA REPAIR  1961   right inguinal hernia  . UPPER GASTROINTESTINAL ENDOSCOPY      There were no vitals filed for this visit.      Subjective Assessment - 09/27/16 1558    Subjective Both sides of my back is hurting today.  I'm trying to do the exercises.   Patient Stated Goals Reduce my pain.   Pain Score 9    Pain Location Back   Pain Orientation Right;Left;Lower   Pain Descriptors / Indicators Aching;Dull;Discomfort   Pain Onset More than a month ago                         [facility] Adult PT Treatment/Exercise - 09/27/16 0001      Traction   Type of Traction Lumbar   Min (lbs) 5   Max (lbs) 75   Hold Time 99   Rest Time 5   Time 15     Manual Therapy   Manual Therapy Scapular mobilization   Soft tissue mobilization In prone over pillow:  23 minutes with bilateral QL release technique and gentle PA mobs over lower thoracic and lumbar vertebrae.                  PT Short Term Goals - 09/21/16 1520      PT SHORT TERM GOAL #1   Title Ind with a HEP.   Time 2   Period Weeks   Status Achieved           PT Long Term Goals - 09/21/16 1520      PT LONG TERM GOAL #1   Title Perform ADL's with pain not > 3/10.   Baseline 8/10 or more by lunch time   Time 8   Period Weeks   Status On-going     PT LONG TERM GOAL #2   Title Stand 45 minutes with pain not > 3-4/10.   Baseline able to make a pound cake without resting 7/10 pain   Time 8   Period Weeks   Status On-going     PT LONG TERM GOAL #3   Title Patient to report decreased B knee pain by 25% with ADLS   Period Weeks   Status On-going                Plan - 09/27/16 1603    Clinical Impression Statement Patient responded well to 5# increase in traction today.      Patient will benefit from skilled therapeutic intervention in order to improve the following deficits and impairments:  Decreased activity tolerance, Decreased range of motion, Pain, Postural dysfunction  Visit Diagnosis: Chronic bilateral low back pain without sciatica  Pain in thoracic spine  Abnormal posture     Problem List Patient Active Problem List   Diagnosis Date Noted  . Osteopenia of the elderly 02/19/2014  . DDD (degenerative disc disease), lumbar 12/03/2013  . Scoliosis 12/03/2013  . Chronic LBP 05/14/2013  . IBS (irritable bowel syndrome) 11/08/2012  . UC (ulcerative colitis) ([place]) 04/23/2012  . Palpitation 10/26/2011  . Hypertension 10/26/2011  . Chronic ulcerative colitis ([place]) 03/23/2011    Deanne Bedgood, [place] MPT 09/27/2016, 4:16 PM  [facility] [address] [place], [place], [address] Phone: [phone]   Fax:  [phone]  Name: Lori Soto MRN: [id number] Date of Birth: [birth year]

## 2016-09-30 ENCOUNTER — Ambulatory Visit: Payer: Medicare Other | Attending: Family Medicine | Admitting: Physical Therapy

## 2016-09-30 DIAGNOSIS — M545 Low back pain: Secondary | ICD-10-CM | POA: Diagnosis not present

## 2016-09-30 DIAGNOSIS — M546 Pain in thoracic spine: Secondary | ICD-10-CM | POA: Insufficient documentation

## 2016-09-30 DIAGNOSIS — R293 Abnormal posture: Secondary | ICD-10-CM | POA: Insufficient documentation

## 2016-09-30 DIAGNOSIS — G8929 Other chronic pain: Secondary | ICD-10-CM | POA: Insufficient documentation

## 2016-09-30 NOTE — Therapy (Signed)
[facility] [place], [place], [address] Phone: [phone]   Fax:  [phone]  Physical Therapy Treatment  Patient Details  Name: Lori Soto MRN: [id number] Date of Birth: [birth year] Referring Provider: Rosana Berger MD  Encounter Date: 09/30/2016      PT End of Session - 09/30/16 1036    Visit Number 6   Number of Visits 16   Date for PT Re-Evaluation 11/06/16   PT Start Time 8003   PT Stop Time 1128  delay with traction as two patients needed it.   PT Time Calculation (min) 53 min   Activity Tolerance Patient tolerated treatment well   Behavior During Therapy [facility] for tasks assessed/performed      Past Medical History:  Diagnosis Date  . Anemia   . Cancer ([place])    skin cancer on nose  . Carpal tunnel syndrome, bilateral   . Cataract   . Esophagitis   . GERD (gastroesophageal reflux disease)   . H/O hiatal hernia   . Osteopenia   . Scoliosis   . Shingles   . Shingles   . Ulcerative colitis     Past Surgical History:  Procedure Laterality Date  . ABDOMINAL HYSTERECTOMY    . BUNIONECTOMY     Bilateral  . CARPAL TUNNEL RELEASE Left 03/10/2015   Procedure: LEFT CARPAL TUNNEL RELEASE;  Surgeon: Daryll Brod, MD;  Location: [facility];  Service: Orthopedics;  Laterality: Left;  . CATARACT EXTRACTION W/PHACO  05/28/2012   Procedure: CATARACT EXTRACTION PHACO AND INTRAOCULAR LENS PLACEMENT (IOC);  Surgeon: Tonny Branch, MD;  [facility];  Service: Ophthalmology;  Laterality: Right;  CDE=12.84  . CATARACT EXTRACTION W/PHACO  06/07/2012   Procedure: CATARACT EXTRACTION PHACO AND INTRAOCULAR LENS PLACEMENT (IOC);  Surgeon: Tonny Branch, MD;  [facility];  Service: Ophthalmology;  Laterality: Left;  CDE 17.60  . COLONOSCOPY    . COLONOSCOPY N/A 07/08/2015   Procedure: COLONOSCOPY;  Surgeon: Rogene Houston, MD;  [facility];  Service: Endoscopy;  Laterality: N/A;  930  . ESOPHAGEAL  DILATION N/A 07/08/2015   Procedure: ESOPHAGEAL DILATION;  Surgeon: Rogene Houston, MD;  [facility];  Service: Endoscopy;  Laterality: N/A;  . ESOPHAGOGASTRODUODENOSCOPY N/A 07/08/2015   Procedure: ESOPHAGOGASTRODUODENOSCOPY (EGD);  Surgeon: Rogene Houston, MD;  [facility];  Service: Endoscopy;  Laterality: N/A;  . Hernia     Right inguinal  . HERNIA REPAIR  1961   right inguinal hernia  . UPPER GASTROINTESTINAL ENDOSCOPY      There were no vitals filed for this visit.      Subjective Assessment - 09/30/16 1036    Subjective Patient reports she has not been standing yet today so it's not too bad.   Pertinent History Thoracic and lumbar scoliosis.  Small umbilical hernia that she has had for years.   Patient Stated Goals Reduce my pain.   Currently in Pain? Yes   Pain Score 7    Pain Location Back   Pain Orientation Right;Left;Lower   Pain Descriptors / Indicators Aching;Dull   Pain Type Chronic pain   Pain Onset More than a month ago   Pain Frequency Constant   Aggravating Factors  prolonged activity   Pain Relieving Factors traction                         [facility] Adult PT Treatment/Exercise - 09/30/16 0001  Self-Care   Self-Care Other Self-Care Comments   Other Self-Care Comments  MFR with tennis ball to R thoracic, lumbar and gluteals     Modalities   Modalities Ultrasound;Traction     Ultrasound   Ultrasound Location R low back   Ultrasound Parameters 1.5 w/cm2 1 mhz cont x 8 min   Ultrasound Goals Pain     Traction   Type of Traction Lumbar   Min (lbs) 5   Max (lbs) 75   Hold Time 99   Rest Time 5   Time 15     Manual Therapy   Manual Therapy Soft tissue mobilization   Soft tissue mobilization in prone over pillow to B lumbar paraspinals and gluteals                  PT Short Term Goals - 09/21/16 1520      PT SHORT TERM GOAL #1   Title Ind with a HEP.   Time 2   Period Weeks   Status Achieved            PT Long Term Goals - 09/21/16 1520      PT LONG TERM GOAL #1   Title Perform ADL's with pain not > 3/10.   Baseline 8/10 or more by lunch time   Time 8   Period Weeks   Status On-going     PT LONG TERM GOAL #2   Title Stand 45 minutes with pain not > 3-4/10.   Baseline able to make a pound cake without resting 7/10 pain   Time 8   Period Weeks   Status On-going     PT LONG TERM GOAL #3   Title Patient to report decreased B knee pain by 25% with ADLS   Period Weeks   Status On-going               Plan - 09/30/16 1224    Clinical Impression Statement Patient continues to report relief with traction and STW. No change in goal status today.   Rehab Potential Good   PT Frequency 2x / week   PT Duration 8 weeks   PT Treatment/Interventions ADLs/Self Care Home Management;Cryotherapy;Electrical Stimulation;Ultrasound;Traction;Moist Heat;Therapeutic activities;Therapeutic exercise;Manual techniques;Patient/family education;Passive range of motion   PT Next Visit Plan Continue with manual therapy and lumbar traction per MPT POC   PT Home Exercise Plan self care MFR   Consulted and Agree with Plan of Care Patient      Patient will benefit from skilled therapeutic intervention in order to improve the following deficits and impairments:  Decreased activity tolerance, Decreased range of motion, Pain, Postural dysfunction  Visit Diagnosis: Chronic bilateral low back pain without sciatica  Pain in thoracic spine     Problem List Patient Active Problem List   Diagnosis Date Noted  . Osteopenia of the elderly 02/19/2014  . DDD (degenerative disc disease), lumbar 12/03/2013  . Scoliosis 12/03/2013  . Chronic LBP 05/14/2013  . IBS (irritable bowel syndrome) 11/08/2012  . UC (ulcerative colitis) ([place]) 04/23/2012  . Palpitation 10/26/2011  . Hypertension 10/26/2011  . Chronic ulcerative colitis ([place]) 03/23/2011    Madelyn Flavors PT 09/30/2016, 12:28  PM  [facility] [address] [place], [place], [address] Phone: [phone]   Fax:  [phone]  Name: Lori Soto MRN: [id number] Date of Birth: Aug 02, 1935

## 2016-10-03 ENCOUNTER — Ambulatory Visit: Payer: Medicare Other | Admitting: Physical Therapy

## 2016-10-03 ENCOUNTER — Encounter: Payer: Self-pay | Admitting: Physical Therapy

## 2016-10-03 DIAGNOSIS — M545 Low back pain: Principal | ICD-10-CM

## 2016-10-03 DIAGNOSIS — M546 Pain in thoracic spine: Secondary | ICD-10-CM

## 2016-10-03 DIAGNOSIS — G8929 Other chronic pain: Secondary | ICD-10-CM

## 2016-10-03 DIAGNOSIS — R293 Abnormal posture: Secondary | ICD-10-CM

## 2016-10-03 NOTE — Therapy (Signed)
[facility] [place], [place], [address] Phone: [phone]   Fax:  [phone]  Physical Therapy Treatment  Patient Details  Name: Lori Soto MRN: [id number] Date of Birth: 1935-06-22 Referring Provider: Rosana Berger MD  Encounter Date: 10/03/2016      PT End of Session - 10/03/16 1458    Visit Number 7   Number of Visits 16   Date for PT Re-Evaluation 11/06/16   PT Start Time 7121   PT Stop Time 1534   PT Time Calculation (min) 41 min   Activity Tolerance Patient tolerated treatment well   Behavior During Therapy [facility] for tasks assessed/performed      Past Medical History:  Diagnosis Date  . Anemia   . Cancer ([place])    skin cancer on nose  . Carpal tunnel syndrome, bilateral   . Cataract   . Esophagitis   . GERD (gastroesophageal reflux disease)   . H/O hiatal hernia   . Osteopenia   . Scoliosis   . Shingles   . Shingles   . Ulcerative colitis     Past Surgical History:  Procedure Laterality Date  . ABDOMINAL HYSTERECTOMY    . BUNIONECTOMY     Bilateral  . CARPAL TUNNEL RELEASE Left 03/10/2015   Procedure: LEFT CARPAL TUNNEL RELEASE;  Surgeon: Daryll Brod, MD;  Location: [facility];  Service: Orthopedics;  Laterality: Left;  . CATARACT EXTRACTION W/PHACO  05/28/2012   Procedure: CATARACT EXTRACTION PHACO AND INTRAOCULAR LENS PLACEMENT (IOC);  Surgeon: Tonny Branch, MD;  [facility];  Service: Ophthalmology;  Laterality: Right;  CDE=12.84  . CATARACT EXTRACTION W/PHACO  06/07/2012   Procedure: CATARACT EXTRACTION PHACO AND INTRAOCULAR LENS PLACEMENT (IOC);  Surgeon: Tonny Branch, MD;  [facility];  Service: Ophthalmology;  Laterality: Left;  CDE 17.60  . COLONOSCOPY    . COLONOSCOPY N/A 07/08/2015   Procedure: COLONOSCOPY;  Surgeon: Rogene Houston, MD;  [facility];  Service: Endoscopy;  Laterality: N/A;  930  . ESOPHAGEAL DILATION N/A 07/08/2015   Procedure: ESOPHAGEAL  DILATION;  Surgeon: Rogene Houston, MD;  [facility];  Service: Endoscopy;  Laterality: N/A;  . ESOPHAGOGASTRODUODENOSCOPY N/A 07/08/2015   Procedure: ESOPHAGOGASTRODUODENOSCOPY (EGD);  Surgeon: Rogene Houston, MD;  [facility];  Service: Endoscopy;  Laterality: N/A;  . Hernia     Right inguinal  . HERNIA REPAIR  1961   right inguinal hernia  . UPPER GASTROINTESTINAL ENDOSCOPY      There were no vitals filed for this visit.      Subjective Assessment - 10/03/16 1454    Subjective Patient did a lot of standing over weekend and did garding   Pertinent History Thoracic and lumbar scoliosis.  Small umbilical hernia that she has had for years.   Patient Stated Goals Reduce my pain.   Currently in Pain? Yes   Pain Score 9    Pain Location Back   Pain Orientation Right;Left;Lower   Pain Descriptors / Indicators Aching   Pain Type Chronic pain   Pain Onset More than a month ago   Pain Frequency Constant   Aggravating Factors  prolong activity   Pain Relieving Factors traction                         [facility] Adult PT Treatment/Exercise - 10/03/16 0001      Ultrasound   Ultrasound Location r low back/glut  Ultrasound Parameters 1.5w/cm2/50%/22mz x136m   Ultrasound Goals Pain     Traction   Type of Traction Lumbar   Min (lbs) 5   Max (lbs) 75   Hold Time 99   Rest Time 5   Time 15     Manual Therapy   Manual Therapy Soft tissue mobilization   Soft tissue mobilization  B lumbar paraspinals and gluteals                  PT Short Term Goals - 10/03/16 1519      PT SHORT TERM GOAL #1   Title Ind with a HEP.   Time 2   Period Weeks   Status Achieved           PT Long Term Goals - 10/03/16 1519      PT LONG TERM GOAL #1   Title Perform ADL's with pain not > 3/10.   Baseline 8/10 or more by lunch time   Time 8   Period Weeks   Status On-going     PT LONG TERM GOAL #2   Title Stand 45 minutes with pain not >  3-4/10.   Baseline able to make a pound cake without resting 7/10 pain   Time 8   Period Weeks   Status On-going               Plan - 10/03/16 1501    Clinical Impression Statement Patient continues to respond well to treatment and feels like therapy helps her maintain. Patient continues to perform moderate ADL's and yard work which increases her pain. Educated patient on HEP daily and posture techniques to help strengthen back and avoid increased pain. Patient unable to meet any further goals due to ongoing high pain reported after ADL's or increased activity.   Rehab Potential Good   PT Frequency 2x / week   PT Duration 8 weeks   PT Treatment/Interventions ADLs/Self Care Home Management;Cryotherapy;Electrical Stimulation;Ultrasound;Traction;Moist Heat;Therapeutic activities;Therapeutic exercise;Manual techniques;Patient/family education;Passive range of motion   PT Next Visit Plan Continue with manual therapy and lumbar traction per MPT POC   Consulted and Agree with Plan of Care Patient      Patient will benefit from skilled therapeutic intervention in order to improve the following deficits and impairments:  Decreased activity tolerance, Decreased range of motion, Pain, Postural dysfunction  Visit Diagnosis: Chronic bilateral low back pain without sciatica  Pain in thoracic spine  Abnormal posture     Problem List Patient Active Problem List   Diagnosis Date Noted  . Osteopenia of the elderly 02/19/2014  . DDD (degenerative disc disease), lumbar 12/03/2013  . Scoliosis 12/03/2013  . Chronic LBP 05/14/2013  . IBS (irritable bowel syndrome) 11/08/2012  . [place]  . Palpitation 10/26/2011  . Hypertension 10/26/2011  . [place]ative [place]    Shawnta Schlegel P, PTA 10/03/2016, 3:34 PM  [id number]: [phone]  Fax:  [phone]: BoIyannah BlakeRN: [id number] of Birth: [birth year]

## 2016-10-07 ENCOUNTER — Ambulatory Visit: Payer: Medicare Other | Admitting: *Deleted

## 2016-10-07 DIAGNOSIS — R293 Abnormal posture: Secondary | ICD-10-CM | POA: Diagnosis not present

## 2016-10-07 DIAGNOSIS — G8929 Other chronic pain: Secondary | ICD-10-CM

## 2016-10-07 DIAGNOSIS — M546 Pain in thoracic spine: Secondary | ICD-10-CM

## 2016-10-07 DIAGNOSIS — M545 Low back pain: Secondary | ICD-10-CM | POA: Diagnosis not present

## 2016-10-07 NOTE — Therapy (Signed)
[facility] [place], [place], [address] Phone: [phone]   Fax:  [phone]  Physical Therapy Treatment  Patient Details  Name: Lori Soto MRN: [id number] Date of Birth: 13-Jun-1935 Referring Provider: Rosana Berger MD  Encounter Date: 10/07/2016      PT End of Session - 10/07/16 1210    Visit Number 8   Number of Visits 16   Date for PT Re-Evaluation 11/06/16   PT Start Time 1120   PT Stop Time 1209   PT Time Calculation (min) 49 min      Past Medical History:  Diagnosis Date  . Anemia   . Cancer ([place])    skin cancer on nose  . Carpal tunnel syndrome, bilateral   . Cataract   . Esophagitis   . GERD (gastroesophageal reflux disease)   . H/O hiatal hernia   . Osteopenia   . Scoliosis   . Shingles   . Shingles   . Ulcerative colitis     Past Surgical History:  Procedure Laterality Date  . ABDOMINAL HYSTERECTOMY    . BUNIONECTOMY     Bilateral  . CARPAL TUNNEL RELEASE Left 03/10/2015   Procedure: LEFT CARPAL TUNNEL RELEASE;  Surgeon: Daryll Brod, MD;  Location: [place];  Service: Orthopedics;  Laterality: Left;  . CATARACT EXTRACTION W/PHACO  05/28/2012   Procedure: CATARACT EXTRACTION PHACO AND INTRAOCULAR LENS PLACEMENT (IOC);  Surgeon: Tonny Branch, MD;  [facility];  Service: Ophthalmology;  Laterality: Right;  CDE=12.84  . CATARACT EXTRACTION W/PHACO  06/07/2012   Procedure: CATARACT EXTRACTION PHACO AND INTRAOCULAR LENS PLACEMENT (IOC);  Surgeon: Tonny Branch, MD;  [facility];  Service: Ophthalmology;  Laterality: Left;  CDE 17.60  . COLONOSCOPY    . COLONOSCOPY N/A 07/08/2015   Procedure: COLONOSCOPY;  Surgeon: Rogene Houston, MD;  [facility];  Service: Endoscopy;  Laterality: N/A;  930  . ESOPHAGEAL DILATION N/A 07/08/2015   Procedure: ESOPHAGEAL DILATION;  Surgeon: Rogene Houston, MD;  [facility];  Service: Endoscopy;  Laterality: N/A;  .  ESOPHAGOGASTRODUODENOSCOPY N/A 07/08/2015   Procedure: ESOPHAGOGASTRODUODENOSCOPY (EGD);  Surgeon: Rogene Houston, MD;  [facility];  Service: Endoscopy;  Laterality: N/A;  . Hernia     Right inguinal  . HERNIA REPAIR  1961   right inguinal hernia  . UPPER GASTROINTESTINAL ENDOSCOPY      There were no vitals filed for this visit.                       [facility] Adult PT Treatment/Exercise - 10/07/16 0001      Modalities   Modalities Ultrasound;Traction     Ultrasound   Ultrasound Location LT LB paras   Ultrasound Parameters 1.5 w/cm2 x 10 mins LT sidelying   Ultrasound Goals Pain     Traction   Type of Traction Lumbar   Min (lbs) 5   Max (lbs) 75   Hold Time 99   Rest Time 5   Time 15     Manual Therapy   Manual Therapy Soft tissue mobilization   Soft tissue mobilization  B lumbar paraspinals and RT QL with Pt LT sidelying                  PT Short Term Goals - 10/03/16 1519      PT SHORT TERM GOAL #1   Title Ind with a HEP.   Time  2   Period Weeks   Status Achieved           PT Long Term Goals - 10/03/16 1519      PT LONG TERM GOAL #1   Title Perform ADL's with pain not > 3/10.   Baseline 8/10 or more by lunch time   Time 8   Period Weeks   Status On-going     PT LONG TERM GOAL #2   Title Stand 45 minutes with pain not > 3-4/10.   Baseline able to make a pound cake without resting 7/10 pain   Time 8   Period Weeks   Status On-going               Plan - 10/07/16 1210    Clinical Impression Statement Pt arrived to clinic dpoing fairly well today, but with more pain on LT than RT. She did well with [place] and STW to both sides  LB with RT QL being the most taut and sore.  Traction was performed at 75#s again and tolerated well. LTGs are ongoing at this time.   Rehab Potential Good   PT Frequency 2x / week   PT Duration 8 weeks   PT Treatment/Interventions ADLs/Self Care Home Management;Cryotherapy;Electrical  Stimulation;Ultrasound;Traction;Moist Heat;Therapeutic activities;Therapeutic exercise;Manual techniques;Patient/family education;Passive range of motion   PT Next Visit Plan Continue with manual therapy and lumbar traction per MPT POC   PT Home Exercise Plan self care MFR   Consulted and Agree with Plan of Care Patient      Patient will benefit from skilled therapeutic intervention in order to improve the following deficits and impairments:  Decreased activity tolerance, Decreased range of motion, Pain, Postural dysfunction  Visit Diagnosis: Chronic bilateral low back pain without sciatica  Pain in thoracic spine  Abnormal posture  Chronic right-sided low back pain without sciatica     Problem List Patient Active Problem List   Diagnosis Date Noted  . Osteopenia of the elderly 02/19/2014  . DDD (degenerative disc disease), lumbar 12/03/2013  . Scoliosis 12/03/2013  . Chronic LBP 05/14/2013  . IBS (irritable bowel syndrome) 11/08/2012  . UC (ulcerative colitis) ([place]) 04/23/2012  . Palpitation 10/26/2011  . Hypertension 10/26/2011  . Chronic ulcerative colitis ([place]) 03/23/2011    Jaylah Goodlow,CHRIS, PTA 10/07/2016, 12:25 PM  [facility] [address] [place], [place], [address] Phone: [phone]   Fax:  [phone]  Name: Lori Soto MRN: [id number] Date of Birth: 06/03/1935

## 2016-10-10 ENCOUNTER — Ambulatory Visit: Payer: Medicare Other | Admitting: *Deleted

## 2016-10-10 DIAGNOSIS — G8929 Other chronic pain: Secondary | ICD-10-CM | POA: Diagnosis not present

## 2016-10-10 DIAGNOSIS — R293 Abnormal posture: Secondary | ICD-10-CM

## 2016-10-10 DIAGNOSIS — M545 Low back pain: Secondary | ICD-10-CM | POA: Diagnosis not present

## 2016-10-10 DIAGNOSIS — M546 Pain in thoracic spine: Secondary | ICD-10-CM

## 2016-10-10 NOTE — Therapy (Signed)
[facility] [place], [place], [address] Phone: [phone]   Fax:  [phone]  Physical Therapy Treatment  Patient Details  Name: Lori Soto MRN: [id number] Date of Birth: [birth year] Referring Provider: Rosana Berger MD  Encounter Date: 10/10/2016      PT End of Session - 10/10/16 1514    Visit Number 9   Number of Visits 16   Date for PT Re-Evaluation 11/06/16   Authorization Type Gcode 10th visit   PT Start Time 1430   PT Stop Time 1520   PT Time Calculation (min) 50 min      Past Medical History:  Diagnosis Date  . Anemia   . Cancer ([place])    skin cancer on nose  . Carpal tunnel syndrome, bilateral   . Cataract   . Esophagitis   . GERD (gastroesophageal reflux disease)   . H/O hiatal hernia   . Osteopenia   . Scoliosis   . Shingles   . Shingles   . Ulcerative colitis     Past Surgical History:  Procedure Laterality Date  . ABDOMINAL HYSTERECTOMY    . BUNIONECTOMY     Bilateral  . CARPAL TUNNEL RELEASE Left 03/10/2015   Procedure: LEFT CARPAL TUNNEL RELEASE;  Surgeon: Daryll Brod, MD;  Location: [place];  Service: Orthopedics;  Laterality: Left;  . CATARACT EXTRACTION W/PHACO  05/28/2012   Procedure: CATARACT EXTRACTION PHACO AND INTRAOCULAR LENS PLACEMENT (IOC);  Surgeon: Tonny Branch, MD;  [facility];  Service: Ophthalmology;  Laterality: Right;  CDE=12.84  . CATARACT EXTRACTION W/PHACO  06/07/2012   Procedure: CATARACT EXTRACTION PHACO AND INTRAOCULAR LENS PLACEMENT (IOC);  Surgeon: Tonny Branch, MD;  [facility];  Service: Ophthalmology;  Laterality: Left;  CDE 17.60  . COLONOSCOPY    . COLONOSCOPY N/A 07/08/2015   Procedure: COLONOSCOPY;  Surgeon: Rogene Houston, MD;  [facility];  Service: Endoscopy;  Laterality: N/A;  930  . ESOPHAGEAL DILATION N/A 07/08/2015   Procedure: ESOPHAGEAL DILATION;  Surgeon: Rogene Houston, MD;  [facility];  Service:  Endoscopy;  Laterality: N/A;  . ESOPHAGOGASTRODUODENOSCOPY N/A 07/08/2015   Procedure: ESOPHAGOGASTRODUODENOSCOPY (EGD);  Surgeon: Rogene Houston, MD;  [facility];  Service: Endoscopy;  Laterality: N/A;  . Hernia     Right inguinal  . HERNIA REPAIR  1961   right inguinal hernia  . UPPER GASTROINTESTINAL ENDOSCOPY      There were no vitals filed for this visit.                       [facility] Adult PT Treatment/Exercise - 10/10/16 0001      Modalities   Modalities Ultrasound;Traction     Ultrasound   Ultrasound Location Bil LB paras with Pt prone   Ultrasound Parameters 1.5 w/cm2 x 10 mins   Ultrasound Goals Pain     Traction   Type of Traction Lumbar   Min (lbs) 5   Max (lbs) 75   Hold Time 99   Rest Time 5   Time 15     Manual Therapy   Manual Therapy Soft tissue mobilization   Soft tissue mobilization  B lumbar paraspinals and RT QL with Pt LT sidelying                  PT Short Term Goals - 10/03/16 1519      PT SHORT TERM GOAL #1  Title Ind with a HEP.   Time 2   Period Weeks   Status Achieved           PT Long Term Goals - 10/03/16 1519      PT LONG TERM GOAL #1   Title Perform ADL's with pain not > 3/10.   Baseline 8/10 or more by lunch time   Time 8   Period Weeks   Status On-going     PT LONG TERM GOAL #2   Title Stand 45 minutes with pain not > 3-4/10.   Baseline able to make a pound cake without resting 7/10 pain   Time 8   Period Weeks   Status On-going               Plan - 10/10/16 1509    Clinical Impression Statement Pt arrived to clinic doing better from last Rx with decreased BP and knee pain. Her pain level was back up today due to planting some plants and other ADLs. She did well with [place] and STW today with good relases in RT QL. Traction was performed at 75#s again and tolerated well.   Rehab Potential Good   PT Frequency 2x / week   PT Duration 8 weeks   PT Next Visit Plan Continue  with manual therapy and lumbar traction per MPT POC   PT Home Exercise Plan self care MFR   Consulted and Agree with Plan of Care Patient      Patient will benefit from skilled therapeutic intervention in order to improve the following deficits and impairments:  Decreased activity tolerance, Decreased range of motion, Pain, Postural dysfunction  Visit Diagnosis: Chronic bilateral low back pain without sciatica  Pain in thoracic spine  Abnormal posture     Problem List Patient Active Problem List   Diagnosis Date Noted  . Osteopenia of the elderly 02/19/2014  . DDD (degenerative disc disease), lumbar 12/03/2013  . Scoliosis 12/03/2013  . Chronic LBP 05/14/2013  . IBS (irritable bowel syndrome) 11/08/2012  . UC (ulcerative colitis) ([place]) 04/23/2012  . Palpitation 10/26/2011  . Hypertension 10/26/2011  . Chronic ulcerative colitis ([place]) 03/23/2011    Ezzie Senat,CHRIS, PTA 10/10/2016, 3:27 PM  [facility] [place], [place], [address] Phone: [phone]   Fax:  [phone]  Name: Lori Soto MRN: [id number] Date of Birth: 06/10/1935

## 2016-10-14 ENCOUNTER — Ambulatory Visit: Payer: Medicare Other | Admitting: Physical Therapy

## 2016-10-14 DIAGNOSIS — M545 Low back pain: Secondary | ICD-10-CM | POA: Diagnosis not present

## 2016-10-14 DIAGNOSIS — G8929 Other chronic pain: Secondary | ICD-10-CM

## 2016-10-14 DIAGNOSIS — R293 Abnormal posture: Secondary | ICD-10-CM | POA: Diagnosis not present

## 2016-10-14 DIAGNOSIS — M546 Pain in thoracic spine: Secondary | ICD-10-CM | POA: Diagnosis not present

## 2016-10-14 NOTE — Therapy (Signed)
[facility] [place], [place], [address] Phone: [phone]   Fax:  [phone]  Physical Therapy Treatment  Patient Details  Name: Lori Soto MRN: [id number] Date of Birth: November 30, 1935 Referring Provider: Rosana Berger MD  Encounter Date: 10/14/2016      PT End of Session - 10/14/16 1146    Visit Number 10   Number of Visits 16   Date for PT Re-Evaluation 11/06/16   Authorization Type Gcode 10th visit   PT Start Time 1117      Past Medical History:  Diagnosis Date  . Anemia   . Cancer ([place])    skin cancer on nose  . Carpal tunnel syndrome, bilateral   . Cataract   . Esophagitis   . GERD (gastroesophageal reflux disease)   . H/O hiatal hernia   . Osteopenia   . Scoliosis   . Shingles   . Shingles   . Ulcerative colitis     Past Surgical History:  Procedure Laterality Date  . ABDOMINAL HYSTERECTOMY    . BUNIONECTOMY     Bilateral  . CARPAL TUNNEL RELEASE Left 03/10/2015   Procedure: LEFT CARPAL TUNNEL RELEASE;  Surgeon: Daryll Brod, MD;  Location: [place];  Service: Orthopedics;  Laterality: Left;  . CATARACT EXTRACTION W/PHACO  05/28/2012   Procedure: CATARACT EXTRACTION PHACO AND INTRAOCULAR LENS PLACEMENT (IOC);  Surgeon: Tonny Branch, MD;  [facility];  Service: Ophthalmology;  Laterality: Right;  CDE=12.84  . CATARACT EXTRACTION W/PHACO  06/07/2012   Procedure: CATARACT EXTRACTION PHACO AND INTRAOCULAR LENS PLACEMENT (IOC);  Surgeon: Tonny Branch, MD;  [facility];  Service: Ophthalmology;  Laterality: Left;  CDE 17.60  . COLONOSCOPY    . COLONOSCOPY N/A 07/08/2015   Procedure: COLONOSCOPY;  Surgeon: Rogene Houston, MD;  [facility];  Service: Endoscopy;  Laterality: N/A;  930  . ESOPHAGEAL DILATION N/A 07/08/2015   Procedure: ESOPHAGEAL DILATION;  Surgeon: Rogene Houston, MD;  [facility];  Service: Endoscopy;  Laterality: N/A;  .  ESOPHAGOGASTRODUODENOSCOPY N/A 07/08/2015   Procedure: ESOPHAGOGASTRODUODENOSCOPY (EGD);  Surgeon: Rogene Houston, MD;  [facility];  Service: Endoscopy;  Laterality: N/A;  . Hernia     Right inguinal  . HERNIA REPAIR  1961   right inguinal hernia  . UPPER GASTROINTESTINAL ENDOSCOPY      There were no vitals filed for this visit.      Subjective Assessment - 10/14/16 1204    Subjective Patient states she is hurting a lot today due to standing for three hours this morning.   Pertinent History Thoracic and lumbar scoliosis.  Small umbilical hernia that she has had for years.   Patient Stated Goals Reduce my pain.   Currently in Pain? Yes   Pain Score 9    Pain Location Back   Pain Orientation Right;Left;Lower   Pain Descriptors / Indicators Aching   Pain Type Chronic pain   Pain Onset More than a month ago   Pain Frequency Constant   Aggravating Factors  prolonged standing/activity   Pain Relieving Factors traction            [facility] PT Assessment - 10/14/16 0001      Observation/Other Assessments   Focus on Therapeutic Outcomes (FOTO)  60% limited                     [facility] Adult PT Treatment/Exercise - 10/14/16 0001  Self-Care   Self-Care Other Self-Care Comments   Other Self-Care Comments  discussed possible use of inversion table as alternative to traction. PT suggested she discuss with her physician.     Modalities   Modalities Ultrasound;Traction     Ultrasound   Ultrasound Location B lumbar paraspinals   Ultrasound Parameters 1.5 w/cm2 1 Mhz cont x 10 min   Ultrasound Goals Pain     Traction   Type of Traction Lumbar   Min (lbs) 5   Max (lbs) 75   Hold Time 99   Rest Time 5   Time 15     Manual Therapy   Manual Therapy Soft tissue mobilization   Soft tissue mobilization 0 B lumbar paraspinals and B QL with Pt prone                  PT Short Term Goals - 10/03/16 1519      PT SHORT TERM GOAL #1   Title Ind  with a HEP.   Time 2   Period Weeks   Status Achieved           PT Long Term Goals - 10/31/2016 1147      PT LONG TERM GOAL #1   Title Perform ADL's with pain not > 3/10.   Baseline 8/10 or more by lunch time   Time 8   Period Weeks   Status On-going     PT LONG TERM GOAL #2   Title Stand 45 minutes with pain not > 3-4/10.   Baseline limit is generally 2 hours and then must sit; standing 3 hours in morning it's 9/10   Period Weeks   Status On-going             Patient will benefit from skilled therapeutic intervention in order to improve the following deficits and impairments:     Visit Diagnosis: Chronic bilateral low back pain without sciatica       G-Codes - Oct 31, 2016 1213    Functional Assessment Tool Used (Outpatient Only) FOTO...60% limitation.   Functional Limitation Mobility: Walking and moving around   Mobility: Walking and Moving Around Current Status [phone]) At least 60 percent but less than 80 percent impaired, limited or restricted   Mobility: Walking and Moving Around Goal Status [phone]) At least 40 percent but less than 60 percent impaired, limited or restricted      Problem List Patient Active Problem List   Diagnosis Date Noted  . Osteopenia of the elderly 02/19/2014  . DDD (degenerative disc disease), lumbar 12/03/2013  . Scoliosis 12/03/2013  . Chronic LBP 05/14/2013  . IBS (irritable bowel syndrome) 11/08/2012  . UC (ulcerative colitis) ([place]) 04/23/2012  . Palpitation 10/26/2011  . Hypertension 10/26/2011  . Chronic ulcerative colitis ([place]) 03/23/2011    Madelyn Flavors PT Oct 31, 2016, 12:19 PM  [facility] [address] [place], [place], [address] Phone: [phone]   Fax:  [phone]  Name: Lori Soto MRN: [id number] Date of Birth: [birth year]

## 2016-10-24 ENCOUNTER — Ambulatory Visit: Payer: Medicare Other | Admitting: Physical Therapy

## 2016-10-24 ENCOUNTER — Encounter: Payer: Self-pay | Admitting: Physical Therapy

## 2016-10-24 DIAGNOSIS — M545 Low back pain: Principal | ICD-10-CM

## 2016-10-24 DIAGNOSIS — R293 Abnormal posture: Secondary | ICD-10-CM

## 2016-10-24 DIAGNOSIS — G8929 Other chronic pain: Secondary | ICD-10-CM | POA: Diagnosis not present

## 2016-10-24 DIAGNOSIS — M546 Pain in thoracic spine: Secondary | ICD-10-CM

## 2016-10-24 NOTE — Therapy (Signed)
[facility] [place], [place], [address] Phone: [phone]   Fax:  [phone]  Physical Therapy Treatment  Patient Details  Name: Lori Soto MRN: [id number] Date of Birth: [birth year] Referring Provider: Rosana Berger MD  Encounter Date: 10/24/2016      PT End of Session - 10/24/16 1530    Visit Number 11   Number of Visits 16   Date for PT Re-Evaluation 11/06/16   Authorization Type Gcode 10th visit   PT Start Time 0229   PT Stop Time 0320   PT Time Calculation (min) 51 min   Activity Tolerance Patient tolerated treatment well   Behavior During Therapy [facility] for tasks assessed/performed      Past Medical History:  Diagnosis Date  . Anemia   . Cancer ([place])    skin cancer on nose  . Carpal tunnel syndrome, bilateral   . Cataract   . Esophagitis   . GERD (gastroesophageal reflux disease)   . H/O hiatal hernia   . Osteopenia   . Scoliosis   . Shingles   . Shingles   . Ulcerative colitis     Past Surgical History:  Procedure Laterality Date  . ABDOMINAL HYSTERECTOMY    . BUNIONECTOMY     Bilateral  . CARPAL TUNNEL RELEASE Left 03/10/2015   Procedure: LEFT CARPAL TUNNEL RELEASE;  Surgeon: Daryll Brod, MD;  Location: [place];  Service: Orthopedics;  Laterality: Left;  . CATARACT EXTRACTION W/PHACO  05/28/2012   Procedure: CATARACT EXTRACTION PHACO AND INTRAOCULAR LENS PLACEMENT (IOC);  Surgeon: Tonny Branch, MD;  [facility];  Service: Ophthalmology;  Laterality: Right;  CDE=12.84  . CATARACT EXTRACTION W/PHACO  06/07/2012   Procedure: CATARACT EXTRACTION PHACO AND INTRAOCULAR LENS PLACEMENT (IOC);  Surgeon: Tonny Branch, MD;  [facility];  Service: Ophthalmology;  Laterality: Left;  CDE 17.60  . COLONOSCOPY    . COLONOSCOPY N/A 07/08/2015   Procedure: COLONOSCOPY;  Surgeon: Rogene Houston, MD;  [facility];  Service: Endoscopy;  Laterality: N/A;  930  . ESOPHAGEAL DILATION  N/A 07/08/2015   Procedure: ESOPHAGEAL DILATION;  Surgeon: Rogene Houston, MD;  [facility];  Service: Endoscopy;  Laterality: N/A;  . ESOPHAGOGASTRODUODENOSCOPY N/A 07/08/2015   Procedure: ESOPHAGOGASTRODUODENOSCOPY (EGD);  Surgeon: Rogene Houston, MD;  [facility];  Service: Endoscopy;  Laterality: N/A;  . Hernia     Right inguinal  . HERNIA REPAIR  1961   right inguinal hernia  . UPPER GASTROINTESTINAL ENDOSCOPY      There were no vitals filed for this visit.      Subjective Assessment - 10/24/16 1454    Subjective Had to [place] the [place] early due to a death in the family.  Patient using an OTC pain patch in her right low back region today.   Patient Stated Goals Reduce my pain.   Pain Score 9    Pain Location Back   Pain Orientation Right   Pain Descriptors / Indicators Aching;Throbbing   Pain Type Chronic pain   Pain Onset More than a month ago                         [facility] Adult PT Treatment/Exercise - 10/24/16 0001      Modalities   Modalities Electrical Stimulation;Traction;Ultrasound     Electrical Stimulation   Electrical Stimulation Location Right lumbar region.   Electrical Stimulation Action Pre-mod.  Electrical Stimulation Parameters Constant 80-150 Hz x 20 minutes.   Electrical Stimulation Goals Pain     Traction   Type of Traction Lumbar   Min (lbs) 5   Max (lbs) 80   Hold Time 99   Rest Time 5   Time 15     Manual Therapy   Manual Therapy Soft tissue mobilization   Manual therapy comments In prone;  Right QL release x 9 minutes.                  PT Short Term Goals - 10/03/16 1519      PT SHORT TERM GOAL #1   Title Ind with a HEP.   Time 2   Period Weeks   Status Achieved           PT Long Term Goals - 10/14/16 1147      PT LONG TERM GOAL #1   Title Perform ADL's with pain not > 3/10.   Baseline 8/10 or more by lunch time   Time 8   Period Weeks   Status On-going     PT  LONG TERM GOAL #2   Title Stand 45 minutes with pain not > 3-4/10.   Baseline limit is generally 2 hours and then must sit; standing 3 hours in morning it's 9/10   Period Weeks   Status On-going               Plan - 10/24/16 1531    Clinical Impression Statement Patient responded well to increased weight of traction today.      Patient will benefit from skilled therapeutic intervention in order to improve the following deficits and impairments:  Decreased activity tolerance, Decreased range of motion, Pain, Postural dysfunction  Visit Diagnosis: Chronic bilateral low back pain without sciatica  Pain in thoracic spine  Abnormal posture     Problem List Patient Active Problem List   Diagnosis Date Noted  . Osteopenia of the elderly 02/19/2014  . DDD (degenerative disc disease), lumbar 12/03/2013  . Scoliosis 12/03/2013  . Chronic LBP 05/14/2013  . IBS (irritable bowel syndrome) 11/08/2012  . UC (ulcerative colitis) ([place]) 04/23/2012  . Palpitation 10/26/2011  . Hypertension 10/26/2011  . Chronic ulcerative colitis ([place]) 03/23/2011    Lori Soto, [place] MPT 10/24/2016, 3:46 PM  [facility] [address] [place], [place], [address] Phone: [phone]   Fax:  [phone]  Name: Lori Soto MRN: [id number] Date of Birth: 1935/11/30

## 2016-10-28 ENCOUNTER — Ambulatory Visit: Payer: Medicare Other | Admitting: Physical Therapy

## 2016-10-28 DIAGNOSIS — G8929 Other chronic pain: Secondary | ICD-10-CM | POA: Diagnosis not present

## 2016-10-28 DIAGNOSIS — M545 Low back pain, unspecified: Secondary | ICD-10-CM

## 2016-10-28 DIAGNOSIS — M546 Pain in thoracic spine: Secondary | ICD-10-CM | POA: Diagnosis not present

## 2016-10-28 DIAGNOSIS — R293 Abnormal posture: Secondary | ICD-10-CM | POA: Diagnosis not present

## 2016-10-28 NOTE — Therapy (Signed)
[facility] [place], [place], [address] Phone: [phone]   Fax:  [phone]  Physical Therapy Treatment  Patient Details  Name: Lori Soto MRN: [id number] Date of Birth: 12-14-1935 Referring Provider: Rosana Berger MD  Encounter Date: 10/28/2016      PT End of Session - 10/28/16 1120    Visit Number 12   Number of Visits 16   Date for PT Re-Evaluation 11/06/16   Authorization Type Gcode 20th visit   PT Start Time 1118   PT Stop Time 1218   PT Time Calculation (min) 60 min   Activity Tolerance Patient tolerated treatment well   Behavior During Therapy [facility] for tasks assessed/performed      Past Medical History:  Diagnosis Date  . Anemia   . Cancer ([place])    skin cancer on nose  . Carpal tunnel syndrome, bilateral   . Cataract   . Esophagitis   . GERD (gastroesophageal reflux disease)   . H/O hiatal hernia   . Osteopenia   . Scoliosis   . Shingles   . Shingles   . Ulcerative colitis     Past Surgical History:  Procedure Laterality Date  . ABDOMINAL HYSTERECTOMY    . BUNIONECTOMY     Bilateral  . CARPAL TUNNEL RELEASE Left 03/10/2015   Procedure: LEFT CARPAL TUNNEL RELEASE;  Surgeon: Daryll Brod, MD;  Location: [facility];  Service: Orthopedics;  Laterality: Left;  . CATARACT EXTRACTION W/PHACO  05/28/2012   Procedure: CATARACT EXTRACTION PHACO AND INTRAOCULAR LENS PLACEMENT (IOC);  Surgeon: Tonny Branch, MD;  [facility];  Service: Ophthalmology;  Laterality: Right;  CDE=12.84  . CATARACT EXTRACTION W/PHACO  06/07/2012   Procedure: CATARACT EXTRACTION PHACO AND INTRAOCULAR LENS PLACEMENT (IOC);  Surgeon: Tonny Branch, MD;  [facility];  Service: Ophthalmology;  Laterality: Left;  CDE 17.60  . COLONOSCOPY    . COLONOSCOPY N/A 07/08/2015   Procedure: COLONOSCOPY;  Surgeon: Rogene Houston, MD;  [facility];  Service: Endoscopy;  Laterality: N/A;  930  . ESOPHAGEAL DILATION  N/A 07/08/2015   Procedure: ESOPHAGEAL DILATION;  Surgeon: Rogene Houston, MD;  [facility];  Service: Endoscopy;  Laterality: N/A;  . ESOPHAGOGASTRODUODENOSCOPY N/A 07/08/2015   Procedure: ESOPHAGOGASTRODUODENOSCOPY (EGD);  Surgeon: Rogene Houston, MD;  [facility];  Service: Endoscopy;  Laterality: N/A;  . Hernia     Right inguinal  . HERNIA REPAIR  1961   right inguinal hernia  . UPPER GASTROINTESTINAL ENDOSCOPY      There were no vitals filed for this visit.      Subjective Assessment - 10/28/16 1121    Subjective Patient reports her back is better than last visit but still 7-8/10.   Patient Stated Goals Reduce my pain.   Currently in Pain? Yes   Pain Score 7    Pain Location Back   Pain Orientation Right   Pain Descriptors / Indicators Aching;Throbbing   Pain Type Chronic pain   Pain Onset More than a month ago   Pain Frequency Constant   Aggravating Factors  pronlonged                          [facility] Adult PT Treatment/Exercise - 10/28/16 0001      Modalities   Modalities Electrical Stimulation;Traction     Electrical Stimulation   Electrical Stimulation Location B lumbar premod 80-150 Hz x 15 min  Electrical Stimulation Goals Pain     Traction   Type of Traction Lumbar   Min (lbs) 5   Max (lbs) 80   Hold Time 99   Rest Time 5   Time 15     Manual Therapy   Manual Therapy Soft tissue mobilization   Soft tissue mobilization B lumbar paraspinals, superior gluteals and B QL with Pt prone                  PT Short Term Goals - 10/03/16 1519      PT SHORT TERM GOAL #1   Title Ind with a HEP.   Time 2   Period Weeks   Status Achieved           PT Long Term Goals - 10/14/16 1147      PT LONG TERM GOAL #1   Title Perform ADL's with pain not > 3/10.   Baseline 8/10 or more by lunch time   Time 8   Period Weeks   Status On-going     PT LONG TERM GOAL #2   Title Stand 45 minutes with pain not >  3-4/10.   Baseline limit is generally 2 hours and then must sit; standing 3 hours in morning it's 9/10   Period Weeks   Status On-going               Plan - 10/28/16 1141    Clinical Impression Statement Patient continues to report relief with traction. Goals remain ongoing however. She had increased tone in left gluteals today.   Rehab Potential Good   PT Frequency 2x / week   PT Duration 8 weeks   PT Treatment/Interventions ADLs/Self Care Home Management;Cryotherapy;Electrical Stimulation;Ultrasound;Traction;Moist Heat;Therapeutic activities;Therapeutic exercise;Manual techniques;Patient/family education;Passive range of motion   PT Next Visit Plan Continue with manual therapy and lumbar traction per MPT POC   Consulted and Agree with Plan of Care Patient      Patient will benefit from skilled therapeutic intervention in order to improve the following deficits and impairments:  Decreased activity tolerance, Decreased range of motion, Pain, Postural dysfunction  Visit Diagnosis: Chronic bilateral low back pain without sciatica     Problem List Patient Active Problem List   Diagnosis Date Noted  . Osteopenia of the elderly 02/19/2014  . DDD (degenerative disc disease), lumbar 12/03/2013  . Scoliosis 12/03/2013  . Chronic LBP 05/14/2013  . IBS (irritable bowel syndrome) 11/08/2012  . UC (ulcerative colitis) ([place]) 04/23/2012  . Palpitation 10/26/2011  . Hypertension 10/26/2011  . Chronic ulcerative colitis ([place]) 03/23/2011    Madelyn Flavors PT 10/28/2016, 12:11 PM  [facility] [address] [place], [place], [address] Phone: [phone]   Fax:  [phone]  Name: Lori Soto MRN: [id number] Date of Birth: [birth year]

## 2016-10-31 ENCOUNTER — Ambulatory Visit: Payer: Medicare Other | Attending: Family Medicine | Admitting: Physical Therapy

## 2016-10-31 ENCOUNTER — Encounter: Payer: Self-pay | Admitting: Physical Therapy

## 2016-10-31 DIAGNOSIS — M546 Pain in thoracic spine: Secondary | ICD-10-CM | POA: Insufficient documentation

## 2016-10-31 DIAGNOSIS — R293 Abnormal posture: Secondary | ICD-10-CM | POA: Insufficient documentation

## 2016-10-31 DIAGNOSIS — G8929 Other chronic pain: Secondary | ICD-10-CM | POA: Insufficient documentation

## 2016-10-31 DIAGNOSIS — M545 Low back pain: Secondary | ICD-10-CM | POA: Insufficient documentation

## 2016-10-31 NOTE — Therapy (Signed)
[facility] [place], [place], [address] Phone: [phone]   Fax:  [phone]  Physical Therapy Treatment  Patient Details  Name: Lori Soto MRN: [id number] Date of Birth: 1936/03/17 Referring Provider: Rosana Berger MD  Encounter Date: 10/31/2016      PT End of Session - 10/31/16 1440    Visit Number 13   Number of Visits 16   Date for PT Re-Evaluation 11/06/16   Authorization Type Gcode 20th visit   PT Start Time 1438   PT Stop Time 1536  3 units secondary to machine set up   PT Time Calculation (min) 58 min   Activity Tolerance Patient tolerated treatment well   Behavior During Therapy [facility] for tasks assessed/performed      Past Medical History:  Diagnosis Date  . Anemia   . Cancer ([place])    skin cancer on nose  . Carpal tunnel syndrome, bilateral   . Cataract   . Esophagitis   . GERD (gastroesophageal reflux disease)   . H/O hiatal hernia   . Osteopenia   . Scoliosis   . Shingles   . Shingles   . Ulcerative colitis     Past Surgical History:  Procedure Laterality Date  . ABDOMINAL HYSTERECTOMY    . BUNIONECTOMY     Bilateral  . CARPAL TUNNEL RELEASE Left 03/10/2015   Procedure: LEFT CARPAL TUNNEL RELEASE;  Surgeon: Daryll Brod, MD;  Location: [facility];  Service: Orthopedics;  Laterality: Left;  . CATARACT EXTRACTION W/PHACO  05/28/2012   Procedure: CATARACT EXTRACTION PHACO AND INTRAOCULAR LENS PLACEMENT (IOC);  Surgeon: Tonny Branch, MD;  [facility];  Service: Ophthalmology;  Laterality: Right;  CDE=12.84  . CATARACT EXTRACTION W/PHACO  06/07/2012   Procedure: CATARACT EXTRACTION PHACO AND INTRAOCULAR LENS PLACEMENT (IOC);  Surgeon: Tonny Branch, MD;  [facility];  Service: Ophthalmology;  Laterality: Left;  CDE 17.60  . COLONOSCOPY    . COLONOSCOPY N/A 07/08/2015   Procedure: COLONOSCOPY;  Surgeon: Rogene Houston, MD;  [facility];  Service: Endoscopy;   Laterality: N/A;  930  . ESOPHAGEAL DILATION N/A 07/08/2015   Procedure: ESOPHAGEAL DILATION;  Surgeon: Rogene Houston, MD;  [facility];  Service: Endoscopy;  Laterality: N/A;  . ESOPHAGOGASTRODUODENOSCOPY N/A 07/08/2015   Procedure: ESOPHAGOGASTRODUODENOSCOPY (EGD);  Surgeon: Rogene Houston, MD;  [facility];  Service: Endoscopy;  Laterality: N/A;  . Hernia     Right inguinal  . HERNIA REPAIR  1961   right inguinal hernia  . UPPER GASTROINTESTINAL ENDOSCOPY      There were no vitals filed for this visit.      Subjective Assessment - 10/31/16 1529    Subjective Reports that she is really surprised her back isn't hurting more today as she did a lot of standing this weekend.   Pertinent History Thoracic and lumbar scoliosis.  Small umbilical hernia that she has had for years.   Patient Stated Goals Reduce my pain.   Currently in Pain? Yes   Pain Score 6    Pain Location Back   Pain Orientation Lower;Right   Pain Descriptors / Indicators Discomfort   Pain Type Chronic pain   Pain Onset More than a month ago                         [facility] Adult PT Treatment/Exercise - 10/31/16 0001      Modalities  Modalities Electrical Stimulation;Traction;Moist Heat     Moist Heat Therapy   Number Minutes Moist Heat 15 Minutes   Moist Heat Location Lumbar Spine     Electrical Stimulation   Electrical Stimulation Location B lumbar paraspinals   Electrical Stimulation Action Pre-Mod   Electrical Stimulation Parameters 80-150 hz x15 min   Electrical Stimulation Goals Pain     Traction   Type of Traction Lumbar   Min (lbs) 5   Max (lbs) 80   Hold Time 99   Rest Time 5   Time 15     Manual Therapy   Manual Therapy Soft tissue mobilization   Soft tissue mobilization STW to B lumbar paraspinals, Gluts to reduce pain and tightness in prone                  PT Short Term Goals - 10/03/16 1519      PT SHORT TERM GOAL #1   Title Ind  with a HEP.   Time 2   Period Weeks   Status Achieved           PT Long Term Goals - 10/14/16 1147      PT LONG TERM GOAL #1   Title Perform ADL's with pain not > 3/10.   Baseline 8/10 or more by lunch time   Time 8   Period Weeks   Status On-going     PT LONG TERM GOAL #2   Title Stand 45 minutes with pain not > 3-4/10.   Baseline limit is generally 2 hours and then must sit; standing 3 hours in morning it's 9/10   Period Weeks   Status On-going               Plan - 10/31/16 1530    Clinical Impression Statement Patient presented in clinic with continued R low back pain with radiation into buttock region today but was pleased that intensity was not has high as she has experienced prior to today. Patient reported experiencing a feeling of tightness in R glute region today and discomfort surrounding R iliac crest/ PSIS region today. Normal modalities response to all modalities conducted today.   Rehab Potential Good   PT Frequency 2x / week   PT Duration 8 weeks   PT Treatment/Interventions ADLs/Self Care Home Management;Cryotherapy;Electrical Stimulation;Ultrasound;Traction;Moist Heat;Therapeutic activities;Therapeutic exercise;Manual techniques;Patient/family education;Passive range of motion   PT Next Visit Plan Continue with manual therapy and lumbar traction per MPT POC   PT Home Exercise Plan self care MFR   Consulted and Agree with Plan of Care Patient      Patient will benefit from skilled therapeutic intervention in order to improve the following deficits and impairments:  Decreased activity tolerance, Decreased range of motion, Pain, Postural dysfunction  Visit Diagnosis: Chronic bilateral low back pain without sciatica  Pain in thoracic spine  Abnormal posture     Problem List Patient Active Problem List   Diagnosis Date Noted  . Osteopenia of the elderly 02/19/2014  . DDD (degenerative disc disease), lumbar 12/03/2013  . Scoliosis 12/03/2013   . Chronic LBP 05/14/2013  . IBS (irritable bowel syndrome) 11/08/2012  . UC (ulcerative colitis) ([place]) 04/23/2012  . Palpitation 10/26/2011  . Hypertension 10/26/2011  . Chronic ulcerative colitis ([place]) 03/23/2011    Wynelle Fanny, PTA 10/31/2016, 3:42 PM  [facility] [place], [place], [address] Phone: [phone]   Fax:  [phone]  Name: Sherena Machorro MRN: [id number] Date of Birth:  05/31/1935   

## 2016-11-04 ENCOUNTER — Ambulatory Visit: Payer: Medicare Other | Admitting: Physical Therapy

## 2016-11-04 DIAGNOSIS — R293 Abnormal posture: Secondary | ICD-10-CM

## 2016-11-04 DIAGNOSIS — M546 Pain in thoracic spine: Secondary | ICD-10-CM

## 2016-11-04 DIAGNOSIS — G8929 Other chronic pain: Secondary | ICD-10-CM | POA: Diagnosis not present

## 2016-11-04 DIAGNOSIS — M545 Low back pain, unspecified: Secondary | ICD-10-CM

## 2016-11-04 NOTE — Therapy (Signed)
[facility] [place], [place], [address] Phone: [phone]   Fax:  [phone]  Physical Therapy Treatment  Patient Details  Name: Lori Soto MRN: [id number] Date of Birth: May 28, 1935 Referring Provider: Rosana Berger MD  Encounter Date: 11/04/2016      PT End of Session - 11/04/16 0936    Visit Number 14   Number of Visits 16   Date for PT Re-Evaluation 11/06/16   Authorization Type Gcode 20th visit.Marland KitchenMarland KitchenMarland KitchenKX modifier.   PT Start Time [phone]  Late arrival.   PT Stop Time 0951   PT Time Calculation (min) 41 min      Past Medical History:  Diagnosis Date  . Anemia   . Cancer ([place])    skin cancer on nose  . Carpal tunnel syndrome, bilateral   . Cataract   . Esophagitis   . GERD (gastroesophageal reflux disease)   . H/O hiatal hernia   . Osteopenia   . Scoliosis   . Shingles   . Shingles   . Ulcerative colitis     Past Surgical History:  Procedure Laterality Date  . ABDOMINAL HYSTERECTOMY    . BUNIONECTOMY     Bilateral  . CARPAL TUNNEL RELEASE Left 03/10/2015   Procedure: LEFT CARPAL TUNNEL RELEASE;  Surgeon: Daryll Brod, MD;  Location: [facility];  Service: Orthopedics;  Laterality: Left;  . CATARACT EXTRACTION W/PHACO  05/28/2012   Procedure: CATARACT EXTRACTION PHACO AND INTRAOCULAR LENS PLACEMENT (IOC);  Surgeon: Tonny Branch, MD;  [facility];  Service: Ophthalmology;  Laterality: Right;  CDE=12.84  . CATARACT EXTRACTION W/PHACO  06/07/2012   Procedure: CATARACT EXTRACTION PHACO AND INTRAOCULAR LENS PLACEMENT (IOC);  Surgeon: Tonny Branch, MD;  [facility];  Service: Ophthalmology;  Laterality: Left;  CDE 17.60  . COLONOSCOPY    . COLONOSCOPY N/A 07/08/2015   Procedure: COLONOSCOPY;  Surgeon: Rogene Houston, MD;  [facility];  Service: Endoscopy;  Laterality: N/A;  930  . ESOPHAGEAL DILATION N/A 07/08/2015   Procedure: ESOPHAGEAL DILATION;  Surgeon: Rogene Houston, MD;   [facility];  Service: Endoscopy;  Laterality: N/A;  . ESOPHAGOGASTRODUODENOSCOPY N/A 07/08/2015   Procedure: ESOPHAGOGASTRODUODENOSCOPY (EGD);  Surgeon: Rogene Houston, MD;  [facility];  Service: Endoscopy;  Laterality: N/A;  . Hernia     Right inguinal  . HERNIA REPAIR  1961   right inguinal hernia  . UPPER GASTROINTESTINAL ENDOSCOPY      There were no vitals filed for this visit.      Subjective Assessment - 11/04/16 0934    Subjective I have felt better and pulled weeds on Wednesday.   Pertinent History Thoracic and lumbar scoliosis.  Small umbilical hernia that she has had for years.   Patient Stated Goals Reduce my pain.   Pain Score 4    Pain Location Back   Pain Orientation Right   Pain Descriptors / Indicators Aching   Pain Onset More than a month ago                         [facility] Adult PT Treatment/Exercise - 11/04/16 0001      Traction   Type of Traction Lumbar   Max (lbs) 85   Hold Time 99   Rest Time 5   Time 20     Manual Therapy   Manual Therapy Soft tissue mobilization   Soft tissue mobilization In prone over  pillow for comfort:  Deep STW/M for right QL release x 15 minutes.                  PT Short Term Goals - 10/03/16 1519      PT SHORT TERM GOAL #1   Title Ind with a HEP.   Time 2   Period Weeks   Status Achieved           PT Long Term Goals - 10/14/16 1147      PT LONG TERM GOAL #1   Title Perform ADL's with pain not > 3/10.   Baseline 8/10 or more by lunch time   Time 8   Period Weeks   Status On-going     PT LONG TERM GOAL #2   Title Stand 45 minutes with pain not > 3-4/10.   Baseline limit is generally 2 hours and then must sit; standing 3 hours in morning it's 9/10   Period Weeks   Status On-going               Plan - 11/04/16 6237    Clinical Impression Statement Patient did great with right QL release and increase traction weight today.      Patient will  benefit from skilled therapeutic intervention in order to improve the following deficits and impairments:  Decreased activity tolerance, Decreased range of motion, Pain, Postural dysfunction  Visit Diagnosis: Chronic bilateral low back pain without sciatica  Pain in thoracic spine  Abnormal posture     Problem List Patient Active Problem List   Diagnosis Date Noted  . Osteopenia of the elderly 02/19/2014  . DDD (degenerative disc disease), lumbar 12/03/2013  . Scoliosis 12/03/2013  . Chronic LBP 05/14/2013  . IBS (irritable bowel syndrome) 11/08/2012  . UC (ulcerative colitis) ([place]) 04/23/2012  . Palpitation 10/26/2011  . Hypertension 10/26/2011  . Chronic ulcerative colitis ([place]) 03/23/2011    Larae Caison, [place] MPT 11/04/2016, 10:20 AM  [facility] [address] [place], [place], [address] Phone: [phone]   Fax:  [phone]  Name: Lori Soto MRN: [id number] Date of Birth: 06/07/1935

## 2016-11-07 ENCOUNTER — Ambulatory Visit: Payer: Medicare Other | Admitting: *Deleted

## 2016-11-07 DIAGNOSIS — M546 Pain in thoracic spine: Secondary | ICD-10-CM | POA: Diagnosis not present

## 2016-11-07 DIAGNOSIS — M545 Low back pain, unspecified: Secondary | ICD-10-CM

## 2016-11-07 DIAGNOSIS — R293 Abnormal posture: Secondary | ICD-10-CM

## 2016-11-07 DIAGNOSIS — G8929 Other chronic pain: Secondary | ICD-10-CM | POA: Diagnosis not present

## 2016-11-07 NOTE — Therapy (Signed)
[facility] [place], [place], [address] Phone: [phone]   Fax:  [phone]  Physical Therapy Treatment  Patient Details  Name: Lori Soto MRN: [id number] Date of Birth: [birth year] Referring Provider: Rosana Berger MD  Encounter Date: 11/07/2016      PT End of Session - 11/07/16 1506    Visit Number 15   Number of Visits 16   Date for PT Re-Evaluation 11/06/16   Authorization Type Gcode 20th visit.Marland KitchenMarland KitchenMarland KitchenKX modifier.   PT Start Time 1430   PT Stop Time 1520   PT Time Calculation (min) 50 min      Past Medical History:  Diagnosis Date  . Anemia   . Cancer ([place])    skin cancer on nose  . Carpal tunnel syndrome, bilateral   . Cataract   . Esophagitis   . GERD (gastroesophageal reflux disease)   . H/O hiatal hernia   . Osteopenia   . Scoliosis   . Shingles   . Shingles   . Ulcerative colitis     Past Surgical History:  Procedure Laterality Date  . ABDOMINAL HYSTERECTOMY    . BUNIONECTOMY     Bilateral  . CARPAL TUNNEL RELEASE Left 03/10/2015   Procedure: LEFT CARPAL TUNNEL RELEASE;  Surgeon: Daryll Brod, MD;  Location: [facility];  Service: Orthopedics;  Laterality: Left;  . CATARACT EXTRACTION W/PHACO  05/28/2012   Procedure: CATARACT EXTRACTION PHACO AND INTRAOCULAR LENS PLACEMENT (IOC);  Surgeon: Tonny Branch, MD;  [facility];  Service: Ophthalmology;  Laterality: Right;  CDE=12.84  . CATARACT EXTRACTION W/PHACO  06/07/2012   Procedure: CATARACT EXTRACTION PHACO AND INTRAOCULAR LENS PLACEMENT (IOC);  Surgeon: Tonny Branch, MD;  [facility];  Service: Ophthalmology;  Laterality: Left;  CDE 17.60  . COLONOSCOPY    . COLONOSCOPY N/A 07/08/2015   Procedure: COLONOSCOPY;  Surgeon: Rogene Houston, MD;  [facility];  Service: Endoscopy;  Laterality: N/A;  930  . ESOPHAGEAL DILATION N/A 07/08/2015   Procedure: ESOPHAGEAL DILATION;  Surgeon: Rogene Houston, MD;  [facility];  Service: Endoscopy;  Laterality: N/A;  . ESOPHAGOGASTRODUODENOSCOPY N/A 07/08/2015   Procedure: ESOPHAGOGASTRODUODENOSCOPY (EGD);  Surgeon: Rogene Houston, MD;  [facility];  Service: Endoscopy;  Laterality: N/A;  . Hernia     Right inguinal  . HERNIA REPAIR  1961   right inguinal hernia  . UPPER GASTROINTESTINAL ENDOSCOPY      There were no vitals filed for this visit.                       [facility] Adult PT Treatment/Exercise - 11/07/16 0001      Traction   Type of Traction Lumbar   Min (lbs) 5   Max (lbs) 85   Hold Time 99   Rest Time 5   Time 15     Manual Therapy   Manual Therapy Soft tissue mobilization   Soft tissue mobilization In prone over pillow for comfort:  Deep STW/M for Bil  QL's and LB paras x 24 mins                  PT Short Term Goals - 10/03/16 1519      PT SHORT TERM GOAL #1   Title Ind with a HEP.   Time 2   Period Weeks   Status Achieved           PT Long  Term Goals - 10/14/16 1147      PT LONG TERM GOAL #1   Title Perform ADL's with pain not > 3/10.   Baseline 8/10 or more by lunch time   Time 8   Period Weeks   Status On-going     PT LONG TERM GOAL #2   Title Stand 45 minutes with pain not > 3-4/10.   Baseline limit is generally 2 hours and then must sit; standing 3 hours in morning it's 9/10   Period Weeks   Status On-going               Plan - 11/07/16 1512    Clinical Impression Statement Pt arrived to clinic today with increased tightness and pain  LB paras. She did well withSTW with good releases to  Bil QL'sand had less pain after. Pelvic traction was performed at 85#s again and tolerated well. Pt continues to progrees with skilled PT and is closer to meeting LTG's , but NM yet due to pain greater than 3-4/10.    Rehab Potential Good   PT Frequency 2x / week   PT Duration 8 weeks   PT Treatment/Interventions ADLs/Self Care Home Management;Cryotherapy;Electrical  Stimulation;Ultrasound;Traction;Moist Heat;Therapeutic activities;Therapeutic exercise;Manual techniques;Patient/family education;Passive range of motion   PT Next Visit Plan Continue with manual therapy and lumbar traction per MPT POC   PT Home Exercise Plan self care MFR      Patient will benefit from skilled therapeutic intervention in order to improve the following deficits and impairments:  Decreased activity tolerance, Decreased range of motion, Pain, Postural dysfunction  Visit Diagnosis: Chronic bilateral low back pain without sciatica  Pain in thoracic spine  Abnormal posture     Problem List Patient Active Problem List   Diagnosis Date Noted  . Osteopenia of the elderly 02/19/2014  . DDD (degenerative disc disease), lumbar 12/03/2013  . Scoliosis 12/03/2013  . Chronic LBP 05/14/2013  . IBS (irritable bowel syndrome) 11/08/2012  . UC (ulcerative colitis) ([place]) 04/23/2012  . Palpitation 10/26/2011  . Hypertension 10/26/2011  . Chronic ulcerative colitis ([place]) 03/23/2011    Lori Soto,CHRIS, PTA 11/07/2016, 4:11 PM  [facility] [address] [place], [place], [address] Phone: [phone]   Fax:  [phone]  Name: Lori Soto MRN: [id number] Date of Birth: 15-Jan-1936

## 2016-11-08 ENCOUNTER — Encounter: Payer: Medicare Other | Admitting: Physical Therapy

## 2016-11-11 ENCOUNTER — Ambulatory Visit: Payer: Medicare Other | Admitting: Physical Therapy

## 2016-11-11 DIAGNOSIS — R293 Abnormal posture: Secondary | ICD-10-CM | POA: Diagnosis not present

## 2016-11-11 DIAGNOSIS — M546 Pain in thoracic spine: Secondary | ICD-10-CM | POA: Diagnosis not present

## 2016-11-11 DIAGNOSIS — M545 Low back pain, unspecified: Secondary | ICD-10-CM

## 2016-11-11 DIAGNOSIS — G8929 Other chronic pain: Secondary | ICD-10-CM | POA: Diagnosis not present

## 2016-11-11 NOTE — Therapy (Signed)
[facility] [place], [place], [address] Phone: [phone]   Fax:  [phone]  Physical Therapy Treatment  Patient Details  Name: Lori Soto MRN: [id number] Date of Birth: 10-Oct-1935 Referring Provider: Rosana Berger MD  Encounter Date: 11/11/2016      PT End of Session - 11/11/16 1204    Visit Number 16   Number of Visits 24   Date for PT Re-Evaluation 01/05/17   Authorization Type Gcode 20th visit.Marland KitchenMarland KitchenMarland KitchenKX modifier.   PT Start Time 1030   PT Stop Time 1119   PT Time Calculation (min) 49 min   Activity Tolerance Patient tolerated treatment well   Behavior During Therapy WFL for tasks assessed/performed      Past Medical History:  Diagnosis Date  . Anemia   . Cancer ([place])    skin cancer on nose  . Carpal tunnel syndrome, bilateral   . Cataract   . Esophagitis   . GERD (gastroesophageal reflux disease)   . H/O hiatal hernia   . Osteopenia   . Scoliosis   . Shingles   . Shingles   . Ulcerative colitis     Past Surgical History:  Procedure Laterality Date  . ABDOMINAL HYSTERECTOMY    . BUNIONECTOMY     Bilateral  . CARPAL TUNNEL RELEASE Left 03/10/2015   Procedure: LEFT CARPAL TUNNEL RELEASE;  Surgeon: Daryll Brod, MD;  Location: [facility];  Service: Orthopedics;  Laterality: Left;  . CATARACT EXTRACTION W/PHACO  05/28/2012   Procedure: CATARACT EXTRACTION PHACO AND INTRAOCULAR LENS PLACEMENT (IOC);  Surgeon: Tonny Branch, MD;  [facility];  Service: Ophthalmology;  Laterality: Right;  CDE=12.84  . CATARACT EXTRACTION W/PHACO  06/07/2012   Procedure: CATARACT EXTRACTION PHACO AND INTRAOCULAR LENS PLACEMENT (IOC);  Surgeon: Tonny Branch, MD;  [facility]: [facility];  Service: Ophthalmology;  Laterality: Left;  CDE 17.60  . COLONOSCOPY    . COLONOSCOPY N/A 07/08/2015   Procedure: COLONOSCOPY;  Surgeon: Rogene Houston, MD;  [facility];  Service: Endoscopy;  Laterality: N/A;  930  .  ESOPHAGEAL DILATION N/A 07/08/2015   Procedure: ESOPHAGEAL DILATION;  Surgeon: Rogene Houston, MD;  [facility];  Service: Endoscopy;  Laterality: N/A;  . ESOPHAGOGASTRODUODENOSCOPY N/A 07/08/2015   Procedure: ESOPHAGOGASTRODUODENOSCOPY (EGD);  Surgeon: Rogene Houston, MD;  [facility];  Service: Endoscopy;  Laterality: N/A;  . Hernia     Right inguinal  . HERNIA REPAIR  1961   right inguinal hernia  . UPPER GASTROINTESTINAL ENDOSCOPY      There were no vitals filed for this visit.      Subjective Assessment - 11/11/16 1204    Subjective I haven't done much today so I'm feeling pretty good.   Pain Score 4    Pain Location Back   Pain Orientation Right   Pain Descriptors / Indicators Aching   Pain Type Chronic pain   Pain Onset More than a month ago                         [facility] Adult PT Treatment/Exercise - 11/11/16 0001      Traction   Type of Traction Lumbar   Min (lbs) 5   Max (lbs) 85   Hold Time 99   Rest Time 5   Time 15     Manual Therapy   Manual Therapy Soft tissue mobilization   Soft tissue mobilization In prone:  Deep STW/M to bilateral lumbar erector spinae and QL musculature x 23 minutes right > left.                  PT Short Term Goals - 10/03/16 1519      PT SHORT TERM GOAL #1   Title Ind with a HEP.   Time 2   Period Weeks   Status Achieved           PT Long Term Goals - 10/14/16 1147      PT LONG TERM GOAL #1   Title Perform ADL's with pain not > 3/10.   Baseline 8/10 or more by lunch time   Time 8   Period Weeks   Status On-going     PT LONG TERM GOAL #2   Title Stand 45 minutes with pain not > 3-4/10.   Baseline limit is generally 2 hours and then must sit; standing 3 hours in morning it's 9/10   Period Weeks   Status On-going               Plan - 11/11/16 1207    Clinical Impression Statement Very good response to treatment today.  Her right QL still has a great deal of  tone.      Patient will benefit from skilled therapeutic intervention in order to improve the following deficits and impairments:  Decreased activity tolerance, Decreased range of motion, Pain, Postural dysfunction  Visit Diagnosis: Chronic bilateral low back pain without sciatica  Pain in thoracic spine  Abnormal posture  Chronic right-sided low back pain without sciatica     Problem List Patient Active Problem List   Diagnosis Date Noted  . Osteopenia of the elderly 02/19/2014  . DDD (degenerative disc disease), lumbar 12/03/2013  . Scoliosis 12/03/2013  . Chronic LBP 05/14/2013  . IBS (irritable bowel syndrome) 11/08/2012  . UC (ulcerative colitis) ([place]) 04/23/2012  . Palpitation 10/26/2011  . Hypertension 10/26/2011  . Chronic ulcerative colitis ([place]) 03/23/2011    Hetal Proano, [place] MPT 11/11/2016, 12:09 PM  [facility] [address] [place], [place], [address] Phone: [phone]   Fax:  [phone]  Name: Dyani Babel MRN: [id number] Date of Birth: [birth year]

## 2016-11-14 ENCOUNTER — Ambulatory Visit: Payer: Medicare Other | Admitting: Physical Therapy

## 2016-11-14 DIAGNOSIS — M545 Low back pain: Secondary | ICD-10-CM | POA: Diagnosis not present

## 2016-11-14 DIAGNOSIS — G8929 Other chronic pain: Secondary | ICD-10-CM | POA: Diagnosis not present

## 2016-11-14 DIAGNOSIS — M546 Pain in thoracic spine: Secondary | ICD-10-CM

## 2016-11-14 DIAGNOSIS — R293 Abnormal posture: Secondary | ICD-10-CM | POA: Diagnosis not present

## 2016-11-14 NOTE — Therapy (Signed)
[facility] [place], [place], [address] Phone: [phone]   Fax:  [phone]  Physical Therapy Treatment  Patient Details  Name: Lori Soto MRN: [id number] Date of Birth: 1936/02/02 Referring Provider: Rosana Berger MD  Encounter Date: 11/14/2016      PT End of Session - 11/14/16 0955    Visit Number 17   Number of Visits 24   Date for PT Re-Evaluation 01/05/17   Authorization Type Gcode 20th visit.Marland KitchenMarland KitchenMarland KitchenKX modifier.   PT Start Time [phone]   PT Stop Time 1030  Late arrival.   PT Time Calculation (min) 39 min   Activity Tolerance Patient tolerated treatment well   Behavior During Therapy WFL for tasks assessed/performed      Past Medical History:  Diagnosis Date  . Anemia   . Cancer ([place])    skin cancer on nose  . Carpal tunnel syndrome, bilateral   . Cataract   . Esophagitis   . GERD (gastroesophageal reflux disease)   . H/O hiatal hernia   . Osteopenia   . Scoliosis   . Shingles   . Shingles   . Ulcerative colitis     Past Surgical History:  Procedure Laterality Date  . ABDOMINAL HYSTERECTOMY    . BUNIONECTOMY     Bilateral  . CARPAL TUNNEL RELEASE Left 03/10/2015   Procedure: LEFT CARPAL TUNNEL RELEASE;  Surgeon: Daryll Brod, MD;  Location: [facility];  Service: Orthopedics;  Laterality: Left;  . CATARACT EXTRACTION W/PHACO  05/28/2012   Procedure: CATARACT EXTRACTION PHACO AND INTRAOCULAR LENS PLACEMENT (IOC);  Surgeon: Tonny Branch, MD;  [facility];  Service: Ophthalmology;  Laterality: Right;  CDE=12.84  . CATARACT EXTRACTION W/PHACO  06/07/2012   Procedure: CATARACT EXTRACTION PHACO AND INTRAOCULAR LENS PLACEMENT (IOC);  Surgeon: Tonny Branch, MD;  [facility];  Service: Ophthalmology;  Laterality: Left;  CDE 17.60  . COLONOSCOPY    . COLONOSCOPY N/A 07/08/2015   Procedure: COLONOSCOPY;  Surgeon: Rogene Houston, MD;  [facility];  Service: Endoscopy;  Laterality:  N/A;  930  . ESOPHAGEAL DILATION N/A 07/08/2015   Procedure: ESOPHAGEAL DILATION;  Surgeon: Rogene Houston, MD;  [facility];  Service: Endoscopy;  Laterality: N/A;  . ESOPHAGOGASTRODUODENOSCOPY N/A 07/08/2015   Procedure: ESOPHAGOGASTRODUODENOSCOPY (EGD);  Surgeon: Rogene Houston, MD;  [facility];  Service: Endoscopy;  Laterality: N/A;  . Hernia     Right inguinal  . HERNIA REPAIR  1961   right inguinal hernia  . UPPER GASTROINTESTINAL ENDOSCOPY      There were no vitals filed for this visit.      Subjective Assessment - 11/14/16 0955    Subjective Feeling stiff on the right side today.   Pain Score 6    Pain Location Back   Pain Orientation Right   Pain Descriptors / Indicators Aching   Pain Type Chronic pain   Pain Onset More than a month ago     Treatment:  Int traction at 85# with 99 sec hold and 5 sec rest) f/b STW/M right QL release x 15 minutes.  Patient felt better after treatment.                              PT Short Term Goals - 10/03/16 1519      PT SHORT TERM GOAL #1   Title Ind with a HEP.   Time  2   Period Weeks   Status Achieved           PT Long Term Goals - 10/14/16 1147      PT LONG TERM GOAL #1   Title Perform ADL's with pain not > 3/10.   Baseline 8/10 or more by lunch time   Time 8   Period Weeks   Status On-going     PT LONG TERM GOAL #2   Title Stand 45 minutes with pain not > 3-4/10.   Baseline limit is generally 2 hours and then must sit; standing 3 hours in morning it's 9/10   Period Weeks   Status On-going             Patient will benefit from skilled therapeutic intervention in order to improve the following deficits and impairments:     Visit Diagnosis: Chronic bilateral low back pain without sciatica  Pain in thoracic spine  Abnormal posture     Problem List Patient Active Problem List   Diagnosis Date Noted  . Osteopenia of the elderly 02/19/2014  . DDD  (degenerative disc disease), lumbar 12/03/2013  . Scoliosis 12/03/2013  . Chronic LBP 05/14/2013  . IBS (irritable bowel syndrome) 11/08/2012  . UC (ulcerative colitis) ([place]) 04/23/2012  . Palpitation 10/26/2011  . Hypertension 10/26/2011  . Chronic ulcerative colitis ([place]) 03/23/2011    Lori Soto, [place] MPT 11/14/2016, 10:31 AM  [facility] [address] [place], [place], [address] Phone: [phone]   Fax:  [phone]  Name: Lori Soto MRN: [id number] Date of Birth: 06-09-1935

## 2016-11-17 ENCOUNTER — Encounter: Payer: Self-pay | Admitting: Pharmacist

## 2016-11-17 ENCOUNTER — Other Ambulatory Visit: Payer: Self-pay | Admitting: Pharmacist

## 2016-11-17 ENCOUNTER — Ambulatory Visit (INDEPENDENT_AMBULATORY_CARE_PROVIDER_SITE_OTHER): Payer: Medicare Other | Admitting: Pharmacist

## 2016-11-17 VITALS — BP 138/80 | HR 72 | Ht 62.0 in | Wt 137.0 lb

## 2016-11-17 DIAGNOSIS — I10 Essential (primary) hypertension: Secondary | ICD-10-CM

## 2016-11-17 DIAGNOSIS — Z780 Asymptomatic menopausal state: Secondary | ICD-10-CM

## 2016-11-17 DIAGNOSIS — Z79899 Other long term (current) drug therapy: Secondary | ICD-10-CM

## 2016-11-17 DIAGNOSIS — R945 Abnormal results of liver function studies: Secondary | ICD-10-CM

## 2016-11-17 DIAGNOSIS — R7989 Other specified abnormal findings of blood chemistry: Secondary | ICD-10-CM

## 2016-11-17 DIAGNOSIS — H9193 Unspecified hearing loss, bilateral: Secondary | ICD-10-CM

## 2016-11-17 DIAGNOSIS — Z0000 Encounter for general adult medical examination without abnormal findings: Secondary | ICD-10-CM | POA: Diagnosis not present

## 2016-11-17 DIAGNOSIS — Z8679 Personal history of other diseases of the circulatory system: Secondary | ICD-10-CM

## 2016-11-17 NOTE — Patient Instructions (Addendum)
  Ms. Yodice , Thank you for taking time to come for your Medicare Wellness Visit. I appreciate your ongoing commitment to your health goals. Please review the following plan we discussed and let me know if I can assist you in the future.   These are the goals we discussed: Look for copy of [place] (important to know where these are kept - you can also bring copy to our office to be placed in our file / electronic chart)   Try to do stretches and exercises form handout given.    This is a list of the screening recommended for you and due dates:  Health Maintenance  Topic Date Due  . Mammogram  11/24/2016 - scheduled for 11/24/2016  . Flu Shot  11/30/2016  . DEXA scan (bone density measurement)  03/02/2018  . Tetanus Vaccine  01/22/2025  . Pneumonia vaccines  Completed  *Topic was postponed. The date shown is not the original due date.

## 2016-11-17 NOTE — Progress Notes (Signed)
Patient ID: Lori Soto, female   DOB: Nov 10, 1935, 81 y.o.   MRN: [id number]     Subjective:   Lori Soto is a 81 y.o. female who presents for a subsequent Medicare Annual Wellness Visit.  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  Marital history: widowed;  Has 3 children; 8 grandchildren and 2 great grand children Enjoys spending time with grandchildren, sewing, reading, volunteering with church.  Alcohol/Tobacco/Substances: rare EtOH use; no tobacco or substance use.  [name]'s main health concern is that she is getting shorter and has a fat roll.  She wants to know what type of exercises she can do.    Current Medications (verified) Outpatient Encounter Prescriptions as of 11/17/2016  Medication Sig  . acetaminophen (TYLENOL 8 HOUR ARTHRITIS PAIN) 650 MG CR tablet Take 650 mg by mouth every 8 (eight) hours as needed. Patient states that she takes prn for arthritis in her knees.   . Bromelains 500 MG TABS Take 500 mg by mouth daily.  . Calcium Carbonate-Vitamin D (CALCIUM 600+D) 600-400 MG-UNIT per tablet Take 1 tablet by mouth every evening.  [name] 500 MG CAPS Take 1 capsule by mouth daily.   . Coenzyme Q10 (CO Q-10 PO) Take 1 tablet by mouth daily. With red yeast rice [employer] Brand  . COLLAGEN PO Take 1 tablet by mouth daily. This is known as 1-2-3  . Cyanocobalamin (VITAMIN B 12 PO) Take 1,000 mcg by mouth daily.   [name] estradiol (ESTRACE) 1 MG tablet Take 1 mg by mouth every other day.   . Ginkgo Biloba 40 MG TABS Take 40 mg by mouth daily.   [name] glucosamine-chondroitin 500-400 MG tablet Take 1 tablet by mouth 2 (two) times daily.    [name] Hyaluronic Acid-Vitamin C (HYALURONIC ACID PO) Take 80 mg by mouth daily.  . Mesalamine (ASACOL HD) 800 MG TBEC Patient is to take 1 tablet by mouth twice a day. (Patient taking differently: Take 2 tablets by mouth daily.  Patient is to take 1 tablet by mouth twice a day.)  . MILK THISTLE PO Take 100 mg by mouth daily.  . Misc Natural Products (BUTCHERS BROOM PO) Take 1 tablet by mouth daily.   . MULTIPLE VITAMINS PO Take 1 tablet by mouth daily. In the mornings  . Nutritional Supplements (GRAPESEED EXTRACT PO) Take 60 mg by mouth daily.   [name] OAT BRAN SOLUBLE PO Take 1 tablet by mouth daily.  [name] OVER THE COUNTER MEDICATION Take 66 mg by mouth daily. Vision Gold Lutein  . OVER THE COUNTER MEDICATION Take 250 mg by mouth daily. Alphalipoic Acid 250 mg daily  . pantoprazole (PROTONIX) 40 MG tablet Take 1 tablet (40 mg total) by mouth daily before breakfast. (Patient taking differently: Take 40 mg by mouth every other day. )  . POTASSIUM GLUCONATE PO Take 99 mg by mouth daily. This is Chelated Potassium -[employer] Pride  . pyridOXINE (VITAMIN B-6) 100 MG tablet Take 100 mg by mouth daily.   . Turmeric 500 MG CAPS Take 1 capsule by mouth 2 (two) times daily.   . Vitamins C E (CRANBERRY CONCENTRATE PO) Take 2,500 mg by mouth daily.   . Zinc 50 MG CAPS Take 50 mg by mouth daily.   [name] docusate sodium (COLACE) 100 MG capsule Take 1 capsule (100 mg total) by mouth 2 (two) times daily. (Patient not taking: Reported on 11/17/2016)  No facility-administered encounter medications on file as of 11/17/2016.     Allergies (verified) Penicillins and Influenza virus vacc split pf   History: Past Medical History:  Diagnosis Date  . Anemia   . Arthritis   . Cancer ([place])    skin cancer on nose  . Carpal tunnel syndrome, bilateral   . Cataract   . Esophagitis   . GERD (gastroesophageal reflux disease)   . H/O hiatal hernia   . Osteopenia   . Scoliosis   . Shingles   . Shingles   . Ulcerative colitis    Past Surgical History:  Procedure Laterality Date  . ABDOMINAL HYSTERECTOMY    . BUNIONECTOMY     Bilateral  . CARPAL TUNNEL RELEASE Left 03/10/2015   Procedure: LEFT CARPAL TUNNEL RELEASE;  Surgeon: Daryll Brod, MD;   Location: [facility];  Service: Orthopedics;  Laterality: Left;  . CATARACT EXTRACTION W/PHACO  05/28/2012   Procedure: CATARACT EXTRACTION PHACO AND INTRAOCULAR LENS PLACEMENT (IOC);  Surgeon: Tonny Branch, MD;  [facility];  Service: Ophthalmology;  Laterality: Right;  CDE=12.84  . CATARACT EXTRACTION W/PHACO  06/07/2012   Procedure: CATARACT EXTRACTION PHACO AND INTRAOCULAR LENS PLACEMENT (IOC);  Surgeon: Tonny Branch, MD;  [facility];  Service: Ophthalmology;  Laterality: Left;  CDE 17.60  . COLONOSCOPY    . COLONOSCOPY N/A 07/08/2015   Procedure: COLONOSCOPY;  Surgeon: Rogene Houston, MD;  [facility];  Service: Endoscopy;  Laterality: N/A;  930  . ESOPHAGEAL DILATION N/A 07/08/2015   Procedure: ESOPHAGEAL DILATION;  Surgeon: Rogene Houston, MD;  [facility];  Service: Endoscopy;  Laterality: N/A;  . ESOPHAGOGASTRODUODENOSCOPY N/A 07/08/2015   Procedure: ESOPHAGOGASTRODUODENOSCOPY (EGD);  Surgeon: Rogene Houston, MD;  [facility];  Service: Endoscopy;  Laterality: N/A;  . Hernia     Right inguinal  . HERNIA REPAIR  1961   right inguinal hernia  . UPPER GASTROINTESTINAL ENDOSCOPY     Family History  Problem Relation Age of Onset  . Arthritis Mother   . Cancer Father        pancreat  . Pancreatic cancer Father   . Cancer Brother        lung and liver  . Liver cancer Brother   . Lung cancer Brother   . Heart disease Sister        Rheumatic Fever  . Peptic Ulcer Sister   . Diabetes Sister   . Colon cancer Sister   . Edema Sister   . Congestive Heart Failure Sister   . GI problems Sister        diverticulitis  . Diabetes Sister   . Neuropathy Sister   . COPD Sister    Social History   Occupational History  .  Retired   Social History Main Topics  . Smoking status: Never Smoker  . Smokeless tobacco: Never Used     Comment: Never smoker  . Alcohol use 0.0 oz/week     Comment: Very Rarely  . Drug use: No  . Sexual  activity: Not Currently    Birth control/ protection: Surgical    Do you feel safe at home?  Yes Are there smokers in your home (other than you)? No  Dietary issues and exercise activities: Current Exercise Habits: Home exercise routine, Type of exercise: strength training/weights, Time (Minutes): 10, Frequency (Times/Week): 3, Weekly Exercise (Minutes/Week): 30, Intensity: Mild   Going to physical therapy regularly for scoliosis / traction  therapy  Current Dietary habits:  Eats lots of vegetable and fruits.  She is conscious to avoid fried foods, sodium and high fat foods.  She eats regular meals and rarely snacks. Drinks non sugar beverages or water.     Objective:    Today's Vitals   11/17/16 1431  BP: 138/80  Pulse: 72  Weight: 137 lb (62.1 kg)  Height: 5' 2"  (1.575 m)  PainSc: 4    Body mass index is 25.06 kg/m.  Activities of Daily Living In your present state of health, do you have any difficulty performing the following activities: 11/17/2016  Hearing? Y - decreased hearing on phone  Vision? N  Difficulty concentrating or making decisions? N  Walking or climbing stairs? N  Dressing or bathing? N  Doing errands, shopping? N  Preparing Food and eating ? N  Using the Toilet? N  In the past six months, have you accidently leaked urine? N  Do you have problems with loss of bowel control? N  Managing your Medications? N  Managing your Finances? N  Housekeeping or managing your Housekeeping? N  Some recent data might be hidden     Cardiac Risk Factors include: advanced age (>30mn, >>71women)  Depression Screen PHQ 2/9 Scores 11/17/2016 06/09/2016 12/11/2015 11/16/2015  PHQ - 2 Score 1 0 1 2  PHQ- 9 Score - - - 3     Fall Risk Fall Risk  11/17/2016 06/09/2016 12/11/2015 11/16/2015 06/11/2015  Falls in the past year? No No No No No    Cognitive Function: MMSE - Mini Mental State Exam 11/17/2016 11/16/2015 01/23/2015  Orientation to time 5 5 5   Orientation to Place 5 5 5    Registration 3 3 3   Attention/ Calculation 5 5 4   Recall 3 3 3   Language- name 2 objects 2 2 2   Language- repeat 1 1 1   Language- follow 3 step command 3 3 2   Language- read & follow direction 1 1 1   Write a sentence 1 1 1   Copy design 1 1 1   Total score 30 30 28     Immunizations and Health Maintenance Immunization History  Administered Date(s) Administered  . Pneumococcal Conjugate-13 02/26/2014  . Pneumococcal Polysaccharide-23 03/20/2012  . Tdap 01/23/2015   There are no preventive care reminders to display for this patient.  Patient Care Team: MChipper Herb MD as PCP - General (Family Medicine) RRogene Houston MD as Consulting Physician (Gastroenterology) AGaynelle Arabian MD as Consulting Physician (Orthopedic Surgery)  Indicate any recent Medical Services you may have received from other than [facility] providers in the past year (date may be approximate).    Assessment:    Annual Wellness Visit  Decreased hearing   Screening Tests Health Maintenance  Topic Date Due  . MAMMOGRAM  11/24/2016 (Originally 11/16/2016)  . INFLUENZA VACCINE  11/30/2016  . DEXA SCAN  03/02/2018  . TETANUS/TDAP  01/22/2025  . PNA vac Low Risk Adult  Completed        Plan:   During the course of the visit BCaliforniawas educated and counseled about the following appropriate screening and preventive services:   Vaccines to include Pneumoccal, Influenza, Td and Shingles - patient cannot received influenza due to past adverse reaction.  Discussed Shingrix vaccine - we do not have in stock currently bust she will consider  Colorectal cancer screening - UTD  Cardiovascular disease screening - last EKG was 2014;  Patient requested appt with cardiologist because has been several years  Diabetes  screening -  UTD  Bone Denisty / Osteoporosis Screening - UTD  Mammogram - scheduled for next week  Glaucoma screening /  Eye Exam 0 UTd  Nutrition counseling - continue current diet  Advanced  Directives - copy requested  Physical Activity - given stretches for scoliosis and also core strengthen exercises she can do at home.   Patient request appt with audiologist for hearing assessment due to decreased hearing on phone  appt made for labs 2 days prior to appt with PCP in August - labs ordered / future    Patient Instructions (the written plan) were given to the patient.   Cherre Robins, PharmD   11/17/2016

## 2016-11-18 ENCOUNTER — Ambulatory Visit: Payer: Medicare Other | Admitting: Physical Therapy

## 2016-11-18 ENCOUNTER — Encounter: Payer: Self-pay | Admitting: Physical Therapy

## 2016-11-18 ENCOUNTER — Other Ambulatory Visit: Payer: Self-pay | Admitting: *Deleted

## 2016-11-18 DIAGNOSIS — R293 Abnormal posture: Secondary | ICD-10-CM | POA: Diagnosis not present

## 2016-11-18 DIAGNOSIS — G8929 Other chronic pain: Secondary | ICD-10-CM | POA: Diagnosis not present

## 2016-11-18 DIAGNOSIS — M545 Low back pain, unspecified: Secondary | ICD-10-CM

## 2016-11-18 DIAGNOSIS — M546 Pain in thoracic spine: Secondary | ICD-10-CM | POA: Diagnosis not present

## 2016-11-18 DIAGNOSIS — Z79899 Other long term (current) drug therapy: Secondary | ICD-10-CM

## 2016-11-18 DIAGNOSIS — I10 Essential (primary) hypertension: Secondary | ICD-10-CM

## 2016-11-18 NOTE — Therapy (Signed)
[facility] [place], [place], [address] Phone: [phone]   Fax:  [phone]  Physical Therapy Treatment  Patient Details  Name: Lori Soto MRN: [id number] Date of Birth: 06/23/1935 Referring Provider: Rosana Berger MD  Encounter Date: 11/18/2016      PT End of Session - 11/18/16 1322    Visit Number 18   Number of Visits 24   Date for PT Re-Evaluation 01/05/17   Authorization Type Gcode 20th visit.Marland KitchenMarland KitchenMarland KitchenKX modifier.   PT Start Time 1200   PT Stop Time 1251   PT Time Calculation (min) 51 min   Activity Tolerance Patient tolerated treatment well   Behavior During Therapy WFL for tasks assessed/performed      Past Medical History:  Diagnosis Date  . Anemia   . Arthritis   . Cancer ([place])    skin cancer on nose  . Carpal tunnel syndrome, bilateral   . Cataract   . Esophagitis   . GERD (gastroesophageal reflux disease)   . H/O hiatal hernia   . Osteopenia   . Scoliosis   . Shingles   . Shingles   . Ulcerative colitis     Past Surgical History:  Procedure Laterality Date  . ABDOMINAL HYSTERECTOMY    . BUNIONECTOMY     Bilateral  . CARPAL TUNNEL RELEASE Left 03/10/2015   Procedure: LEFT CARPAL TUNNEL RELEASE;  Surgeon: Daryll Brod, MD;  Location: [place];  Service: Orthopedics;  Laterality: Left;  . CATARACT EXTRACTION W/PHACO  05/28/2012   Procedure: CATARACT EXTRACTION PHACO AND INTRAOCULAR LENS PLACEMENT (IOC);  Surgeon: Tonny Branch, MD;  [facility];  Service: Ophthalmology;  Laterality: Right;  CDE=12.84  . CATARACT EXTRACTION W/PHACO  06/07/2012   Procedure: CATARACT EXTRACTION PHACO AND INTRAOCULAR LENS PLACEMENT (IOC);  Surgeon: Tonny Branch, MD;  [facility];  Service: Ophthalmology;  Laterality: Left;  CDE 17.60  . COLONOSCOPY    . COLONOSCOPY N/A 07/08/2015   Procedure: COLONOSCOPY;  Surgeon: Rogene Houston, MD;  [facility];  Service: Endoscopy;  Laterality: N/A;   930  . ESOPHAGEAL DILATION N/A 07/08/2015   Procedure: ESOPHAGEAL DILATION;  Surgeon: Rogene Houston, MD;  [facility];  Service: Endoscopy;  Laterality: N/A;  . ESOPHAGOGASTRODUODENOSCOPY N/A 07/08/2015   Procedure: ESOPHAGOGASTRODUODENOSCOPY (EGD);  Surgeon: Rogene Houston, MD;  [facility];  Service: Endoscopy;  Laterality: N/A;  . Hernia     Right inguinal  . HERNIA REPAIR  1961   right inguinal hernia  . UPPER GASTROINTESTINAL ENDOSCOPY      There were no vitals filed for this visit.      Subjective Assessment - 11/18/16 1319    Subjective Same thing on the right.   Pain Score 4    Pain Location Back   Pain Orientation Lower;Right   Pain Descriptors / Indicators Aching;Discomfort;Dull   Pain Type Chronic pain   Pain Onset More than a month ago      Treatment;  In prone:  U/S to right low back at 1.50 W/CM2 x 12 minutes f/b STW/M and TP release x 11 minutes f/b int traction at 85# x 15 minutes.                             PT Short Term Goals - 10/03/16 1519      PT SHORT TERM GOAL #1   Title Ind with a HEP.  Time 2   Period Weeks   Status Achieved           PT Long Term Goals - 10/14/16 1147      PT LONG TERM GOAL #1   Title Perform ADL's with pain not > 3/10.   Baseline 8/10 or more by lunch time   Time 8   Period Weeks   Status On-going     PT LONG TERM GOAL #2   Title Stand 45 minutes with pain not > 3-4/10.   Baseline limit is generally 2 hours and then must sit; standing 3 hours in morning it's 9/10   Period Weeks   Status On-going             Patient will benefit from skilled therapeutic intervention in order to improve the following deficits and impairments:     Visit Diagnosis: Chronic bilateral low back pain without sciatica     Problem List Patient Active Problem List   Diagnosis Date Noted  . Osteopenia of the elderly 02/19/2014  . DDD (degenerative disc disease), lumbar  12/03/2013  . Scoliosis 12/03/2013  . Chronic LBP 05/14/2013  . IBS (irritable bowel syndrome) 11/08/2012  . UC (ulcerative colitis) ([place]) 04/23/2012  . Palpitation 10/26/2011  . Hypertension 10/26/2011  . Chronic ulcerative colitis ([place]) 03/23/2011    Kiree Dejarnette, [place]  MPT 11/18/2016, 1:23 PM  [facility] [address] [place], [place], [address] Phone: [phone]   Fax:  [phone]  Name: Lori Soto MRN: [id number] Date of Birth: 26-Apr-1936

## 2016-11-21 ENCOUNTER — Encounter: Payer: Self-pay | Admitting: Physical Therapy

## 2016-11-21 ENCOUNTER — Ambulatory Visit: Payer: Medicare Other | Admitting: Physical Therapy

## 2016-11-21 DIAGNOSIS — G8929 Other chronic pain: Secondary | ICD-10-CM | POA: Diagnosis not present

## 2016-11-21 DIAGNOSIS — M546 Pain in thoracic spine: Secondary | ICD-10-CM | POA: Diagnosis not present

## 2016-11-21 DIAGNOSIS — R293 Abnormal posture: Secondary | ICD-10-CM

## 2016-11-21 DIAGNOSIS — M545 Low back pain: Principal | ICD-10-CM

## 2016-11-21 NOTE — Therapy (Signed)
[facility] [place], [place], [address] Phone: [phone]   Fax:  [phone]  Physical Therapy Treatment  Patient Details  Name: Lori Soto MRN: [id number] Date of Birth: [birth year] Referring Provider: Rosana Berger MD  Encounter Date: 11/21/2016      PT End of Session - 11/21/16 1435    Visit Number 19   Number of Visits 24   Date for PT Re-Evaluation 01/05/17   Authorization Type Gcode 20th visit.Marland KitchenMarland KitchenMarland KitchenKX modifier.   PT Start Time 1435   PT Stop Time 1526   PT Time Calculation (min) 51 min   Activity Tolerance Patient tolerated treatment well   Behavior During Therapy WFL for tasks assessed/performed      Past Medical History:  Diagnosis Date  . Anemia   . Arthritis   . Cancer ([place])    skin cancer on nose  . Carpal tunnel syndrome, bilateral   . Cataract   . Esophagitis   . GERD (gastroesophageal reflux disease)   . H/O hiatal hernia   . Osteopenia   . Scoliosis   . Shingles   . Shingles   . Ulcerative colitis     Past Surgical History:  Procedure Laterality Date  . ABDOMINAL HYSTERECTOMY    . BUNIONECTOMY     Bilateral  . CARPAL TUNNEL RELEASE Left 03/10/2015   Procedure: LEFT CARPAL TUNNEL RELEASE;  Surgeon: Daryll Brod, MD;  Location: [place];  Service: Orthopedics;  Laterality: Left;  . CATARACT EXTRACTION W/PHACO  05/28/2012   Procedure: CATARACT EXTRACTION PHACO AND INTRAOCULAR LENS PLACEMENT (IOC);  Surgeon: Tonny Branch, MD;  [facility];  Service: Ophthalmology;  Laterality: Right;  CDE=12.84  . CATARACT EXTRACTION W/PHACO  06/07/2012   Procedure: CATARACT EXTRACTION PHACO AND INTRAOCULAR LENS PLACEMENT (IOC);  Surgeon: Tonny Branch, MD;  [facility];  Service: Ophthalmology;  Laterality: Left;  CDE 17.60  . COLONOSCOPY    . COLONOSCOPY N/A 07/08/2015   Procedure: COLONOSCOPY;  Surgeon: Rogene Houston, MD;  [facility];  Service: Endoscopy;  Laterality: N/A;   930  . ESOPHAGEAL DILATION N/A 07/08/2015   Procedure: ESOPHAGEAL DILATION;  Surgeon: Rogene Houston, MD;  [facility];  Service: Endoscopy;  Laterality: N/A;  . ESOPHAGOGASTRODUODENOSCOPY N/A 07/08/2015   Procedure: ESOPHAGOGASTRODUODENOSCOPY (EGD);  Surgeon: Rogene Houston, MD;  [facility];  Service: Endoscopy;  Laterality: N/A;  . Hernia     Right inguinal  . HERNIA REPAIR  1961   right inguinal hernia  . UPPER GASTROINTESTINAL ENDOSCOPY      There were no vitals filed for this visit.      Subjective Assessment - 11/21/16 1434    Subjective Reports she has beening running errands all weekend and is very tired.   Pertinent History Thoracic and lumbar scoliosis.  Small umbilical hernia that she has had for years.   Patient Stated Goals Reduce my pain.   Currently in Pain? Yes   Pain Score 10-Worst pain ever   Pain Location Back   Pain Orientation Right;Lower   Pain Descriptors / Indicators Discomfort   Pain Type Chronic pain   Pain Onset More than a month ago            [facility] PT Assessment - 11/21/16 0001      Assessment   Medical Diagnosis Thoracic scoloisis.     Precautions   Precautions None     Restrictions   Weight Bearing Restrictions No  [facility] Adult PT Treatment/Exercise - 11/21/16 0001      Modalities   Modalities Electrical Stimulation;Ultrasound;Traction     Ultrasound   Ultrasound Location R lumbar paraspinals   Ultrasound Parameters 1.5 w/cm2 ,100%, 1 mhz x10 min   Ultrasound Goals Pain     Traction   Type of Traction Lumbar   Min (lbs) 5   Max (lbs) 85   Hold Time 99   Rest Time 5   Time 15     Manual Therapy   Manual Therapy Soft tissue mobilization   Soft tissue mobilization STW to R lumbar paraspinals, QL, glutes to promote reduction of pain and tightness in L sidelying                  PT Short Term Goals - 10/03/16 1519      PT SHORT TERM GOAL #1   Title Ind  with a HEP.   Time 2   Period Weeks   Status Achieved           PT Long Term Goals - 10/14/16 1147      PT LONG TERM GOAL #1   Title Perform ADL's with pain not > 3/10.   Baseline 8/10 or more by lunch time   Time 8   Period Weeks   Status On-going     PT LONG TERM GOAL #2   Title Stand 45 minutes with pain not > 3-4/10.   Baseline limit is generally 2 hours and then must sit; standing 3 hours in morning it's 9/10   Period Weeks   Status On-going               Plan - 11/21/16 1520    Clinical Impression Statement Patient arrived 10/10 low back pain as she has been very busy over the weekend. Patient observed as standing with lumbar flexion secondary to pain. Tightness palpated in R low back region with patient reporting soreness intially with manual therapy. Traction and all modalities continued to reduce patient's pain with normal response following the modalities.   Rehab Potential Good   PT Frequency 2x / week   PT Duration 8 weeks   PT Treatment/Interventions ADLs/Self Care Home Management;Cryotherapy;Electrical Stimulation;Ultrasound;Traction;Moist Heat;Therapeutic activities;Therapeutic exercise;Manual techniques;Patient/family education;Passive range of motion   PT Next Visit Plan Continue with manual therapy and lumbar traction per MPT POC   PT Home Exercise Plan self care MFR   Consulted and Agree with Plan of Care Patient      Patient will benefit from skilled therapeutic intervention in order to improve the following deficits and impairments:  Decreased activity tolerance, Decreased range of motion, Pain, Postural dysfunction  Visit Diagnosis: Chronic bilateral low back pain without sciatica  Pain in thoracic spine  Abnormal posture     Problem List Patient Active Problem List   Diagnosis Date Noted  . Osteopenia of the elderly 02/19/2014  . DDD (degenerative disc disease), lumbar 12/03/2013  . Scoliosis 12/03/2013  . Chronic LBP 05/14/2013   . IBS (irritable bowel syndrome) 11/08/2012  . UC (ulcerative colitis) ([place]) 04/23/2012  . Palpitation 10/26/2011  . Hypertension 10/26/2011  . Chronic ulcerative colitis ([place]) 03/23/2011    Wynelle Fanny, PTA 11/21/2016, 3:38 PM  [facility] [address] [place], [place], [address] Phone: [phone]   Fax:  [phone]  Name: Lori Soto MRN: [id number] Date of Birth: 1935/07/15

## 2016-11-24 DIAGNOSIS — Z1231 Encounter for screening mammogram for malignant neoplasm of breast: Secondary | ICD-10-CM | POA: Diagnosis not present

## 2016-11-25 ENCOUNTER — Encounter: Payer: Self-pay | Admitting: Physical Therapy

## 2016-11-25 ENCOUNTER — Ambulatory Visit: Payer: Medicare Other | Admitting: Physical Therapy

## 2016-11-25 DIAGNOSIS — M545 Low back pain: Secondary | ICD-10-CM | POA: Diagnosis not present

## 2016-11-25 DIAGNOSIS — R293 Abnormal posture: Secondary | ICD-10-CM | POA: Diagnosis not present

## 2016-11-25 DIAGNOSIS — G8929 Other chronic pain: Secondary | ICD-10-CM | POA: Diagnosis not present

## 2016-11-25 DIAGNOSIS — M546 Pain in thoracic spine: Secondary | ICD-10-CM | POA: Diagnosis not present

## 2016-11-25 NOTE — Therapy (Signed)
[facility] [place], [place], [address] Phone: [phone]   Fax:  [phone]  Physical Therapy Treatment  Patient Details  Name: Lori Soto MRN: [id number] Date of Birth: 1936/02/18 Referring Provider: Rosana Berger MD  Encounter Date: 11/25/2016      PT End of Session - 11/25/16 1213    Visit Number 20   Number of Visits 24   Date for PT Re-Evaluation 01/05/17   Authorization Type Gcode 20th visit.Marland KitchenMarland KitchenMarland KitchenKX modifier.   PT Start Time 1040   PT Stop Time 1137  Extended time secondary to traction machine set up    PT Time Calculation (min) 57 min   Activity Tolerance Patient tolerated treatment well   Behavior During Therapy [facility] for tasks assessed/performed      Past Medical History:  Diagnosis Date  . Anemia   . Arthritis   . Cancer ([place])    skin cancer on nose  . Carpal tunnel syndrome, bilateral   . Cataract   . Esophagitis   . GERD (gastroesophageal reflux disease)   . H/O hiatal hernia   . Osteopenia   . Scoliosis   . Shingles   . Shingles   . Ulcerative colitis     Past Surgical History:  Procedure Laterality Date  . ABDOMINAL HYSTERECTOMY    . BUNIONECTOMY     Bilateral  . CARPAL TUNNEL RELEASE Left 03/10/2015   Procedure: LEFT CARPAL TUNNEL RELEASE;  Surgeon: Daryll Brod, MD;  Location: [place];  Service: Orthopedics;  Laterality: Left;  . CATARACT EXTRACTION W/PHACO  05/28/2012   Procedure: CATARACT EXTRACTION PHACO AND INTRAOCULAR LENS PLACEMENT (IOC);  Surgeon: Tonny Branch, MD;  [facility];  Service: Ophthalmology;  Laterality: Right;  CDE=12.84  . CATARACT EXTRACTION W/PHACO  06/07/2012   Procedure: CATARACT EXTRACTION PHACO AND INTRAOCULAR LENS PLACEMENT (IOC);  Surgeon: Tonny Branch, MD;  [facility];  Service: Ophthalmology;  Laterality: Left;  CDE 17.60  . COLONOSCOPY    . COLONOSCOPY N/A 07/08/2015   Procedure: COLONOSCOPY;  Surgeon: Rogene Houston, MD;  [facility];  Service: Endoscopy;  Laterality: N/A;  930  . ESOPHAGEAL DILATION N/A 07/08/2015   Procedure: ESOPHAGEAL DILATION;  Surgeon: Rogene Houston, MD;  [facility];  Service: Endoscopy;  Laterality: N/A;  . ESOPHAGOGASTRODUODENOSCOPY N/A 07/08/2015   Procedure: ESOPHAGOGASTRODUODENOSCOPY (EGD);  Surgeon: Rogene Houston, MD;  [facility];  Service: Endoscopy;  Laterality: N/A;  . Hernia     Right inguinal  . HERNIA REPAIR  1961   right inguinal hernia  . UPPER GASTROINTESTINAL ENDOSCOPY      There were no vitals filed for this visit.      Subjective Assessment - 11/25/16 1209    Subjective Reports that she is still very busy running errands and staying with family.   Pertinent History Thoracic and lumbar scoliosis.  Small umbilical hernia that she has had for years.   Patient Stated Goals Reduce my pain.   Currently in Pain? Yes   Pain Score 9    Pain Location Back   Pain Orientation Right;Lower   Pain Descriptors / Indicators Discomfort;Tightness   Pain Type Chronic pain   Pain Onset More than a month ago            [facility] PT Assessment - 11/25/16 0001      Assessment   Medical Diagnosis Thoracic scoloisis.     Precautions   Precautions None  Restrictions   Weight Bearing Restrictions No                     [facility] Adult PT Treatment/Exercise - 11/25/16 0001      Ultrasound   Ultrasound Location R low back/glute   Ultrasound Parameters 1.5 w/cm2, 100%, 1 mhz x10 min   Ultrasound Goals Pain     Traction   Type of Traction Lumbar   Min (lbs) 5   Max (lbs) 85   Hold Time 99   Rest Time 5   Time 15     Manual Therapy   Manual Therapy Soft tissue mobilization   Soft tissue mobilization STW to R lumbar paraspinals, QL, glutes to promote reduction of pain and tightness in L sidelying                  PT Short Term Goals - 10/03/16 1519      PT SHORT TERM GOAL #1   Title Ind with a HEP.   Time 2    Period Weeks   Status Achieved           PT Long Term Goals - 10/14/16 1147      PT LONG TERM GOAL #1   Title Perform ADL's with pain not > 3/10.   Baseline 8/10 or more by lunch time   Time 8   Period Weeks   Status On-going     PT LONG TERM GOAL #2   Title Stand 45 minutes with pain not > 3-4/10.   Baseline limit is generally 2 hours and then must sit; standing 3 hours in morning it's 9/10   Period Weeks   Status On-going               Plan - 11/25/16 1214    Clinical Impression Statement Patient continues to present in clinic with continued high level R low back pain secondary to stressful time and busy schedule. Tightness still present throughout R lumbar paraspinals and QL as well as R glutes. No complaints of soreness with manual therapy reported by patient. Normal traction response with more erect stance and less pain following end of traction session.   Rehab Potential Good   PT Frequency 2x / week   PT Duration 8 weeks   PT Treatment/Interventions ADLs/Self Care Home Management;Cryotherapy;Electrical Stimulation;Ultrasound;Traction;Moist Heat;Therapeutic activities;Therapeutic exercise;Manual techniques;Patient/family education;Passive range of motion   PT Next Visit Plan Continue with manual therapy and lumbar traction per MPT POC   PT Home Exercise Plan self care MFR   Consulted and Agree with Plan of Care Patient      Patient will benefit from skilled therapeutic intervention in order to improve the following deficits and impairments:  Decreased activity tolerance, Decreased range of motion, Pain, Postural dysfunction  Visit Diagnosis: Chronic bilateral low back pain without sciatica  Pain in thoracic spine  Abnormal posture     Problem List Patient Active Problem List   Diagnosis Date Noted  . Osteopenia of the elderly 02/19/2014  . DDD (degenerative disc disease), lumbar 12/03/2013  . Scoliosis 12/03/2013  . Chronic LBP 05/14/2013  . IBS  (irritable bowel syndrome) 11/08/2012  . UC (ulcerative colitis) ([place]) 04/23/2012  . Palpitation 10/26/2011  . Hypertension 10/26/2011  . Chronic ulcerative colitis ([place]) 03/23/2011    Wynelle Fanny, PTA 11/25/2016, 12:21 PM  [facility] [address] [place], [place], [address] Phone: [phone]   Fax:  [phone]  Name: Lori Soto  MRN: [id number] Date of Birth: [birth year]

## 2016-12-05 ENCOUNTER — Ambulatory Visit: Payer: Medicare Other | Attending: Family Medicine | Admitting: Physical Therapy

## 2016-12-05 DIAGNOSIS — G8929 Other chronic pain: Secondary | ICD-10-CM | POA: Insufficient documentation

## 2016-12-05 DIAGNOSIS — M546 Pain in thoracic spine: Secondary | ICD-10-CM

## 2016-12-05 DIAGNOSIS — M545 Low back pain: Secondary | ICD-10-CM | POA: Insufficient documentation

## 2016-12-05 DIAGNOSIS — R293 Abnormal posture: Secondary | ICD-10-CM

## 2016-12-05 NOTE — Therapy (Signed)
[facility] [place], [place], [address] Phone: [phone]   Fax:  [phone]  Physical Therapy Treatment  Patient Details  Name: Lori Soto MRN: [id number] Date of Birth: [birth year] Referring Provider: Rosana Berger MD  Encounter Date: 12/05/2016      PT End of Session - 12/05/16 1434    Visit Number 21   Number of Visits 24   Date for PT Re-Evaluation 01/05/17   Authorization Type Gcode 20th visit.Marland KitchenMarland KitchenMarland KitchenKX modifier.   PT Start Time 1435   PT Stop Time 1522   PT Time Calculation (min) 47 min   Activity Tolerance Patient tolerated treatment well   Behavior During Therapy WFL for tasks assessed/performed      Past Medical History:  Diagnosis Date  . Anemia   . Arthritis   . Cancer ([place])    skin cancer on nose  . Carpal tunnel syndrome, bilateral   . Cataract   . Esophagitis   . GERD (gastroesophageal reflux disease)   . H/O hiatal hernia   . Osteopenia   . Scoliosis   . Shingles   . Shingles   . Ulcerative colitis     Past Surgical History:  Procedure Laterality Date  . ABDOMINAL HYSTERECTOMY    . BUNIONECTOMY     Bilateral  . CARPAL TUNNEL RELEASE Left 03/10/2015   Procedure: LEFT CARPAL TUNNEL RELEASE;  Surgeon: Daryll Brod, MD;  Location: [place];  Service: Orthopedics;  Laterality: Left;  . CATARACT EXTRACTION W/PHACO  05/28/2012   Procedure: CATARACT EXTRACTION PHACO AND INTRAOCULAR LENS PLACEMENT (IOC);  Surgeon: Tonny Branch, MD;  [facility];  Service: Ophthalmology;  Laterality: Right;  CDE=12.84  . CATARACT EXTRACTION W/PHACO  06/07/2012   Procedure: CATARACT EXTRACTION PHACO AND INTRAOCULAR LENS PLACEMENT (IOC);  Surgeon: Tonny Branch, MD;  [facility];  Service: Ophthalmology;  Laterality: Left;  CDE 17.60  . COLONOSCOPY    . COLONOSCOPY N/A 07/08/2015   Procedure: COLONOSCOPY;  Surgeon: Rogene Houston, MD;  [facility];  Service: Endoscopy;  Laterality: N/A;   930  . ESOPHAGEAL DILATION N/A 07/08/2015   Procedure: ESOPHAGEAL DILATION;  Surgeon: Rogene Houston, MD;  [facility];  Service: Endoscopy;  Laterality: N/A;  . ESOPHAGOGASTRODUODENOSCOPY N/A 07/08/2015   Procedure: ESOPHAGOGASTRODUODENOSCOPY (EGD);  Surgeon: Rogene Houston, MD;  [facility];  Service: Endoscopy;  Laterality: N/A;  . Hernia     Right inguinal  . HERNIA REPAIR  1961   right inguinal hernia  . UPPER GASTROINTESTINAL ENDOSCOPY      There were no vitals filed for this visit.      Subjective Assessment - 12/05/16 1434    Subjective Reports doing alright at the beach last week and did okay with steps.   Pertinent History Thoracic and lumbar scoliosis.  Small umbilical hernia that she has had for years.   Patient Stated Goals Reduce my pain.   Currently in Pain? Yes   Pain Score 7    Pain Location Back   Pain Orientation Right;Lower   Pain Descriptors / Indicators Discomfort;Tightness   Pain Type Chronic pain   Pain Onset More than a month ago            [facility] PT Assessment - 12/05/16 0001      Assessment   Medical Diagnosis Thoracic scoloisis.     Precautions   Precautions None     Restrictions   Weight Bearing Restrictions No  [facility] Adult PT Treatment/Exercise - 12/05/16 0001      Modalities   Modalities Ultrasound;Traction     Ultrasound   Ultrasound Location R low back/ glute   Ultrasound Parameters 1.5 w./cm2, 100%, 1 mhz x10 min   Ultrasound Goals Pain     Traction   Type of Traction Lumbar   Min (lbs) 5   Max (lbs) 85   Hold Time 99   Rest Time 5   Time 15     Manual Therapy   Manual Therapy Soft tissue mobilization   Soft tissue mobilization STW to R lumbar paraspinals, QL, glutes to promote reduction of pain and tightness in L sidelying                  PT Short Term Goals - 10/03/16 1519      PT SHORT TERM GOAL #1   Title Ind with a HEP.   Time 2   Period  Weeks   Status Achieved           PT Long Term Goals - 10/14/16 1147      PT LONG TERM GOAL #1   Title Perform ADL's with pain not > 3/10.   Baseline 8/10 or more by lunch time   Time 8   Period Weeks   Status On-going     PT LONG TERM GOAL #2   Title Stand 45 minutes with pain not > 3-4/10.   Baseline limit is generally 2 hours and then must sit; standing 3 hours in morning it's 9/10   Period Weeks   Status On-going               Plan - 12/05/16 1515    Clinical Impression Statement Patient presented in clinic with continued tightness particularly to R QL region and glutes. Normal [place] response noted following end of the session. No complaints of soreness or pain with manual therapy today. Traction continued at 85# today with no complaints from patient.   Rehab Potential Good   PT Frequency 2x / week   PT Duration 8 weeks   PT Treatment/Interventions ADLs/Self Care Home Management;Cryotherapy;Electrical Stimulation;Ultrasound;Traction;Moist Heat;Therapeutic activities;Therapeutic exercise;Manual techniques;Patient/family education;Passive range of motion   PT Next Visit Plan Continue with manual therapy and lumbar traction per MPT POC   PT Home Exercise Plan self care MFR   Consulted and Agree with Plan of Care Patient      Patient will benefit from skilled therapeutic intervention in order to improve the following deficits and impairments:  Decreased activity tolerance, Decreased range of motion, Pain, Postural dysfunction  Visit Diagnosis: Chronic bilateral low back pain without sciatica  Pain in thoracic spine  Abnormal posture     Problem List Patient Active Problem List   Diagnosis Date Noted  . Osteopenia of the elderly 02/19/2014  . DDD (degenerative disc disease), lumbar 12/03/2013  . Scoliosis 12/03/2013  . Chronic LBP 05/14/2013  . IBS (irritable bowel syndrome) 11/08/2012  . UC (ulcerative colitis) ([place]) 04/23/2012  . Palpitation 10/26/2011   . Hypertension 10/26/2011  . Chronic ulcerative colitis ([place]) 03/23/2011    Wynelle Fanny, PTA 12/05/2016, 3:26 PM  [facility] [address] [place], [place], [address] Phone: [phone]   Fax:  [phone]  Name: Ersel Wadleigh MRN: [id number] Date of Birth: Sep 29, 1935

## 2016-12-08 ENCOUNTER — Ambulatory Visit: Payer: Medicare Other | Admitting: Physical Therapy

## 2016-12-08 ENCOUNTER — Encounter: Payer: Self-pay | Admitting: Physical Therapy

## 2016-12-08 DIAGNOSIS — R293 Abnormal posture: Secondary | ICD-10-CM | POA: Diagnosis not present

## 2016-12-08 DIAGNOSIS — M546 Pain in thoracic spine: Secondary | ICD-10-CM

## 2016-12-08 DIAGNOSIS — M545 Low back pain, unspecified: Secondary | ICD-10-CM

## 2016-12-08 DIAGNOSIS — G8929 Other chronic pain: Secondary | ICD-10-CM | POA: Diagnosis not present

## 2016-12-08 NOTE — Therapy (Signed)
[facility] [place], [place], [address] Phone: [phone]   Fax:  [phone]  Physical Therapy Treatment  Patient Details  Name: Lori Soto MRN: [id number] Date of Birth: 10-20-1935 Referring Provider: Rosana Berger MD  Encounter Date: 12/08/2016      PT End of Session - 12/08/16 1345    Visit Number 22   Number of Visits 24   Date for PT Re-Evaluation 01/05/17   Authorization Type Gcode 20th visit.Marland KitchenMarland KitchenMarland KitchenKX modifier.   PT Start Time 1304   PT Stop Time 1355   PT Time Calculation (min) 51 min   Activity Tolerance Patient tolerated treatment well   Behavior During Therapy WFL for tasks assessed/performed      Past Medical History:  Diagnosis Date  . Anemia   . Arthritis   . Cancer ([place])    skin cancer on nose  . Carpal tunnel syndrome, bilateral   . Cataract   . Esophagitis   . GERD (gastroesophageal reflux disease)   . H/O hiatal hernia   . Osteopenia   . Scoliosis   . Shingles   . Shingles   . Ulcerative colitis     Past Surgical History:  Procedure Laterality Date  . ABDOMINAL HYSTERECTOMY    . BUNIONECTOMY     Bilateral  . CARPAL TUNNEL RELEASE Left 03/10/2015   Procedure: LEFT CARPAL TUNNEL RELEASE;  Surgeon: Daryll Brod, MD;  Location: [facility];  Service: Orthopedics;  Laterality: Left;  . CATARACT EXTRACTION W/PHACO  05/28/2012   Procedure: CATARACT EXTRACTION PHACO AND INTRAOCULAR LENS PLACEMENT (IOC);  Surgeon: Tonny Branch, MD;  [facility];  Service: Ophthalmology;  Laterality: Right;  CDE=12.84  . CATARACT EXTRACTION W/PHACO  06/07/2012   Procedure: CATARACT EXTRACTION PHACO AND INTRAOCULAR LENS PLACEMENT (IOC);  Surgeon: Tonny Branch, MD;  Location: [facility];  Service: Ophthalmology;  Laterality: Left;  CDE 17.60  . COLONOSCOPY    . COLONOSCOPY N/A 07/08/2015   Procedure: COLONOSCOPY;  Surgeon: Rogene Houston, MD;  [facility];  Service: Endoscopy;  Laterality: N/A;   930  . ESOPHAGEAL DILATION N/A 07/08/2015   Procedure: ESOPHAGEAL DILATION;  Surgeon: Rogene Houston, MD;  [facility];  Service: Endoscopy;  Laterality: N/A;  . ESOPHAGOGASTRODUODENOSCOPY N/A 07/08/2015   Procedure: ESOPHAGOGASTRODUODENOSCOPY (EGD);  Surgeon: Rogene Houston, MD;  [facility];  Service: Endoscopy;  Laterality: N/A;  . Hernia     Right inguinal  . HERNIA REPAIR  1961   right inguinal hernia  . UPPER GASTROINTESTINAL ENDOSCOPY      There were no vitals filed for this visit.      Subjective Assessment - 12/08/16 1343    Subjective Reports that she has been doing meals on wheels today and states that she doesn't lift anything she just has to get up and down a lot.   Pertinent History Thoracic and lumbar scoliosis.  Small umbilical hernia that she has had for years.   Patient Stated Goals Reduce my pain.   Currently in Pain? Yes   Pain Score 8    Pain Location Back   Pain Orientation Right;Lower   Pain Descriptors / Indicators Discomfort   Pain Type Chronic pain   Pain Onset More than a month ago            [facility] PT Assessment - 12/08/16 0001      Assessment   Medical Diagnosis Thoracic scoloisis.     Precautions  Precautions None     Restrictions   Weight Bearing Restrictions No                     [facility] Adult PT Treatment/Exercise - 12/08/16 0001      Modalities   Modalities Ultrasound;Traction     Ultrasound   Ultrasound Location R low back   Ultrasound Parameters 1.5 w/cm2, 100%, 1 mhz x10 min   Ultrasound Goals Pain     Traction   Type of Traction Lumbar   Min (lbs) 5   Max (lbs) 85   Hold Time 99   Rest Time 5   Time 15     Manual Therapy   Manual Therapy Soft tissue mobilization   Soft tissue mobilization STW to R lumbar paraspinals, QL, glutes to promote reduction of pain and tightness in L sidelying                  PT Short Term Goals - 10/03/16 1519      PT SHORT TERM GOAL #1    Title Ind with a HEP.   Time 2   Period Weeks   Status Achieved           PT Long Term Goals - 10/14/16 1147      PT LONG TERM GOAL #1   Title Perform ADL's with pain not > 3/10.   Baseline 8/10 or more by lunch time   Time 8   Period Weeks   Status On-going     PT LONG TERM GOAL #2   Title Stand 45 minutes with pain not > 3-4/10.   Baseline limit is generally 2 hours and then must sit; standing 3 hours in morning it's 9/10   Period Weeks   Status On-going               Plan - 12/08/16 1345    Clinical Impression Statement Patient presented in clinic with increased pain secondary to increased activity already today. Increased tightness palpated in R QL and superior glutes with moderate release with manual therapy. Normal modalities response noted following removal of the modalities. Traction maintained at 85# today.   Rehab Potential Good   PT Frequency 2x / week   PT Duration 8 weeks   PT Treatment/Interventions ADLs/Self Care Home Management;Cryotherapy;Electrical Stimulation;Ultrasound;Traction;Moist Heat;Therapeutic activities;Therapeutic exercise;Manual techniques;Patient/family education;Passive range of motion   PT Next Visit Plan Continue with manual therapy and lumbar traction per MPT POC   PT Home Exercise Plan self care MFR   Consulted and Agree with Plan of Care Patient      Patient will benefit from skilled therapeutic intervention in order to improve the following deficits and impairments:  Decreased activity tolerance, Decreased range of motion, Pain, Postural dysfunction  Visit Diagnosis: Chronic bilateral low back pain without sciatica  Pain in thoracic spine  Abnormal posture     Problem List Patient Active Problem List   Diagnosis Date Noted  . Osteopenia of the elderly 02/19/2014  . DDD (degenerative disc disease), lumbar 12/03/2013  . Scoliosis 12/03/2013  . Chronic LBP 05/14/2013  . IBS (irritable bowel syndrome) 11/08/2012  . UC  (ulcerative colitis) ([place]) 04/23/2012  . Palpitation 10/26/2011  . Hypertension 10/26/2011  . Chronic ulcerative colitis ([place]) 03/23/2011    Wynelle Fanny, PTA 12/08/2016, 2:12 PM  [facility] [address] [place], [place], [address] Phone: [phone]   Fax:  [phone]  Name: Berdella Bacot MRN: [id number] Date  of Birth: [birth year]

## 2016-12-09 ENCOUNTER — Encounter: Payer: Medicare Other | Admitting: Physical Therapy

## 2016-12-13 ENCOUNTER — Ambulatory Visit: Payer: Medicare Other | Admitting: Family Medicine

## 2016-12-20 ENCOUNTER — Ambulatory Visit: Payer: Medicare Other | Admitting: Physical Therapy

## 2016-12-20 ENCOUNTER — Encounter: Payer: Self-pay | Admitting: Physical Therapy

## 2016-12-20 ENCOUNTER — Other Ambulatory Visit: Payer: Medicare Other

## 2016-12-20 DIAGNOSIS — M545 Low back pain, unspecified: Secondary | ICD-10-CM

## 2016-12-20 DIAGNOSIS — R7989 Other specified abnormal findings of blood chemistry: Secondary | ICD-10-CM | POA: Diagnosis not present

## 2016-12-20 DIAGNOSIS — R945 Abnormal results of liver function studies: Secondary | ICD-10-CM

## 2016-12-20 DIAGNOSIS — M546 Pain in thoracic spine: Secondary | ICD-10-CM | POA: Diagnosis not present

## 2016-12-20 DIAGNOSIS — Z79899 Other long term (current) drug therapy: Secondary | ICD-10-CM | POA: Diagnosis not present

## 2016-12-20 DIAGNOSIS — R293 Abnormal posture: Secondary | ICD-10-CM

## 2016-12-20 DIAGNOSIS — I10 Essential (primary) hypertension: Secondary | ICD-10-CM | POA: Diagnosis not present

## 2016-12-20 DIAGNOSIS — G8929 Other chronic pain: Secondary | ICD-10-CM | POA: Diagnosis not present

## 2016-12-20 NOTE — Therapy (Signed)
[facility] [place], [place], [address] Phone: [phone]   Fax:  [phone]  Physical Therapy Treatment  Patient Details  Name: Lori Soto MRN: [id number] Date of Birth: 05-Jul-1935 Referring Provider: Rosana Berger MD  Encounter Date: 12/20/2016      PT End of Session - 12/20/16 0943    Visit Number 23   Number of Visits 24   Date for PT Re-Evaluation 01/05/17   Authorization Type Gcode 20th visit.Marland KitchenMarland KitchenMarland KitchenKX modifier.   PT Start Time [phone]   PT Stop Time 1038   PT Time Calculation (min) 47 min   Activity Tolerance Patient tolerated treatment well   Behavior During Therapy WFL for tasks assessed/performed      Past Medical History:  Diagnosis Date  . Anemia   . Arthritis   . Cancer ([place])    skin cancer on nose  . Carpal tunnel syndrome, bilateral   . Cataract   . Esophagitis   . GERD (gastroesophageal reflux disease)   . H/O hiatal hernia   . Osteopenia   . Scoliosis   . Shingles   . Shingles   . Ulcerative colitis     Past Surgical History:  Procedure Laterality Date  . ABDOMINAL HYSTERECTOMY    . BUNIONECTOMY     Bilateral  . CARPAL TUNNEL RELEASE Left 03/10/2015   Procedure: LEFT CARPAL TUNNEL RELEASE;  Surgeon: Daryll Brod, MD;  Location: [facility];  Service: Orthopedics;  Laterality: Left;  . CATARACT EXTRACTION W/PHACO  05/28/2012   Procedure: CATARACT EXTRACTION PHACO AND INTRAOCULAR LENS PLACEMENT (IOC);  Surgeon: Tonny Branch, MD;  [facility];  Service: Ophthalmology;  Laterality: Right;  CDE=12.84  . CATARACT EXTRACTION W/PHACO  06/07/2012   Procedure: CATARACT EXTRACTION PHACO AND INTRAOCULAR LENS PLACEMENT (IOC);  Surgeon: Tonny Branch, MD;  [facility];  Service: Ophthalmology;  Laterality: Left;  CDE 17.60  . COLONOSCOPY    . COLONOSCOPY N/A 07/08/2015   Procedure: COLONOSCOPY;  Surgeon: Rogene Houston, MD;  [facility];  Service: Endoscopy;  Laterality: N/A;   930  . ESOPHAGEAL DILATION N/A 07/08/2015   Procedure: ESOPHAGEAL DILATION;  Surgeon: Rogene Houston, MD;  [facility];  Service: Endoscopy;  Laterality: N/A;  . ESOPHAGOGASTRODUODENOSCOPY N/A 07/08/2015   Procedure: ESOPHAGOGASTRODUODENOSCOPY (EGD);  Surgeon: Rogene Houston, MD;  [facility];  Service: Endoscopy;  Laterality: N/A;  . Hernia     Right inguinal  . HERNIA REPAIR  1961   right inguinal hernia  . UPPER GASTROINTESTINAL ENDOSCOPY      There were no vitals filed for this visit.      Subjective Assessment - 12/20/16 0947    Subjective Reports that she did not do much yesterday or this morning so pain is not too bad. Reports stiffness from cervical to lumbar spine but did exercises prior to coming to clinic.   Pertinent History Thoracic and lumbar scoliosis.  Small umbilical hernia that she has had for years.   Patient Stated Goals Reduce my pain.   Currently in Pain? Yes   Pain Score 6    Pain Location Back   Pain Orientation Right;Lower   Pain Descriptors / Indicators Tightness   Pain Type Chronic pain   Pain Onset More than a month ago            [facility] PT Assessment - 12/20/16 0001      Assessment   Medical Diagnosis Thoracic scoloisis.  Precautions   Precautions None     Restrictions   Weight Bearing Restrictions No                     [facility] Adult PT Treatment/Exercise - 12/20/16 0001      Modalities   Modalities Ultrasound;Traction     Ultrasound   Ultrasound Location R QL   Ultrasound Parameters 1.5 w/cm2, 100%, 1 mhz x10 min   Ultrasound Goals Pain     Traction   Type of Traction Lumbar   Min (lbs) 5   Max (lbs) 85   Hold Time 99   Rest Time 5   Time 15     Manual Therapy   Manual Therapy Soft tissue mobilization   Soft tissue mobilization STW to R lumbar paraspinals, QL to promote reduction of pain and tightness in prone                  PT Short Term Goals - 10/03/16 1519      PT  SHORT TERM GOAL #1   Title Ind with a HEP.   Time 2   Period Weeks   Status Achieved           PT Long Term Goals - 10/14/16 1147      PT LONG TERM GOAL #1   Title Perform ADL's with pain not > 3/10.   Baseline 8/10 or more by lunch time   Time 8   Period Weeks   Status On-going     PT LONG TERM GOAL #2   Title Stand 45 minutes with pain not > 3-4/10.   Baseline limit is generally 2 hours and then must sit; standing 3 hours in morning it's 9/10   Period Weeks   Status On-going               Plan - 12/20/16 1050    Clinical Impression Statement Patient presented in clinic with mid level R low back pain especially along the R low back pain. Normal modalities response with no negetive response following traction at 85#. Tightness palpated predominately along R lumbar paraspinals along the spine and QL which patient reported soreness in R QL as well.    Rehab Potential Good   PT Frequency 2x / week   PT Duration 8 weeks   PT Treatment/Interventions ADLs/Self Care Home Management;Cryotherapy;Electrical Stimulation;Ultrasound;Traction;Moist Heat;Therapeutic activities;Therapeutic exercise;Manual techniques;Patient/family education;Passive range of motion   PT Next Visit Plan Continue with manual therapy and lumbar traction per MPT POC   PT Home Exercise Plan self care MFR   Consulted and Agree with Plan of Care Patient      Patient will benefit from skilled therapeutic intervention in order to improve the following deficits and impairments:  Decreased activity tolerance, Decreased range of motion, Pain, Postural dysfunction  Visit Diagnosis: Chronic bilateral low back pain without sciatica  Pain in thoracic spine  Abnormal posture     Problem List Patient Active Problem List   Diagnosis Date Noted  . Osteopenia of the elderly 02/19/2014  . DDD (degenerative disc disease), lumbar 12/03/2013  . Scoliosis 12/03/2013  . Chronic LBP 05/14/2013  . IBS (irritable  bowel syndrome) 11/08/2012  . UC (ulcerative colitis) ([place]) 04/23/2012  . Palpitation 10/26/2011  . Hypertension 10/26/2011  . Chronic ulcerative colitis ([place]) 03/23/2011    Wynelle Fanny, PTA 12/20/2016, 10:54 AM  [facility] [address] [place], [place], [address] Phone: [phone]   Fax:  [phone]  Name: Lori Soto MRN: [id number] Date of Birth: 1935-10-02

## 2016-12-21 LAB — CMP14+EGFR
ALK PHOS: 34 IU/L — AB (ref 39–117)
ALT: 12 IU/L (ref 0–32)
AST: 19 IU/L (ref 0–40)
Albumin/Globulin Ratio: 1.6 (ref 1.2–2.2)
Albumin: 4.2 g/dL (ref 3.5–4.8)
BUN/Creatinine Ratio: 13 (ref 12–28)
BUN: 10 mg/dL (ref 8–27)
Bilirubin Total: 0.5 mg/dL (ref 0.0–1.2)
CO2: 26 mmol/L (ref 20–29)
Calcium: 9.9 mg/dL (ref 8.7–10.3)
Chloride: 105 mmol/L (ref 96–106)
Creatinine, Ser: 0.75 mg/dL (ref 0.57–1.00)
GFR calc non Af Amer: 76 mL/min/{1.73_m2} (ref >59)
GFR, EST AFRICAN AMERICAN: 88 mL/min/{1.73_m2} (ref >59)
GLUCOSE: 84 mg/dL (ref 65–99)
Globulin, Total: 2.7 g/dL (ref 1.5–4.5)
Potassium: 5.2 mmol/L (ref 3.5–5.2)
Sodium: 145 mmol/L — ABNORMAL HIGH (ref 134–144)
TOTAL PROTEIN: 6.9 g/dL (ref 6.0–8.5)

## 2016-12-21 LAB — LIPID PANEL
CHOLESTEROL TOTAL: 189 mg/dL (ref 100–199)
Chol/HDL Ratio: 2.7 ratio (ref 0.0–4.4)
HDL: 69 mg/dL (ref >39)
LDL Calculated: 107 mg/dL — ABNORMAL HIGH (ref 0–99)
TRIGLYCERIDES: 66 mg/dL (ref 0–149)
VLDL CHOLESTEROL CAL: 13 mg/dL (ref 5–40)

## 2016-12-21 LAB — MAGNESIUM: Magnesium: 1.9 mg/dL (ref 1.6–2.3)

## 2016-12-22 ENCOUNTER — Ambulatory Visit: Payer: Medicare Other | Admitting: Physical Therapy

## 2016-12-22 ENCOUNTER — Encounter: Payer: Self-pay | Admitting: Family Medicine

## 2016-12-22 ENCOUNTER — Encounter: Payer: Self-pay | Admitting: Physical Therapy

## 2016-12-22 ENCOUNTER — Ambulatory Visit (INDEPENDENT_AMBULATORY_CARE_PROVIDER_SITE_OTHER): Payer: Medicare Other | Admitting: Family Medicine

## 2016-12-22 VITALS — BP 143/75 | HR 58 | Temp 97.2°F | Ht 62.0 in | Wt 137.0 lb

## 2016-12-22 DIAGNOSIS — G8929 Other chronic pain: Secondary | ICD-10-CM | POA: Diagnosis not present

## 2016-12-22 DIAGNOSIS — M545 Low back pain, unspecified: Secondary | ICD-10-CM

## 2016-12-22 DIAGNOSIS — R002 Palpitations: Secondary | ICD-10-CM

## 2016-12-22 DIAGNOSIS — K5100 Ulcerative (chronic) pancolitis without complications: Secondary | ICD-10-CM

## 2016-12-22 DIAGNOSIS — M419 Scoliosis, unspecified: Secondary | ICD-10-CM

## 2016-12-22 DIAGNOSIS — Z8249 Family history of ischemic heart disease and other diseases of the circulatory system: Secondary | ICD-10-CM | POA: Diagnosis not present

## 2016-12-22 DIAGNOSIS — R293 Abnormal posture: Secondary | ICD-10-CM

## 2016-12-22 DIAGNOSIS — E559 Vitamin D deficiency, unspecified: Secondary | ICD-10-CM

## 2016-12-22 DIAGNOSIS — M546 Pain in thoracic spine: Secondary | ICD-10-CM | POA: Diagnosis not present

## 2016-12-22 DIAGNOSIS — I10 Essential (primary) hypertension: Secondary | ICD-10-CM

## 2016-12-22 NOTE — Addendum Note (Signed)
Addended by: Zannie Cove on: 12/22/2016 03:44 PM   Modules accepted: Orders

## 2016-12-22 NOTE — Patient Instructions (Addendum)
Medicare Annual Wellness Visit  [facility] and the medical providers at [facility] strive to bring you the best medical care.  In doing so we not only want to address your current medical conditions and concerns but also to detect new conditions early and prevent illness, disease and health-related problems.    Medicare offers a yearly Wellness Visit which allows our clinical staff to assess your need for preventative services including immunizations, lifestyle education, counseling to decrease risk of preventable diseases and screening for fall risk and other medical concerns.    This visit is provided free of charge (no copay) for all Medicare recipients. The clinical pharmacists at [facility] have begun to conduct these Wellness Visits which will also include a thorough review of all your medications.    As you primary medical provider recommend that you make an appointment for your Annual Wellness Visit if you have not done so already this year.  You may set up this appointment before you leave today or you may call back ([phone]) and schedule an appointment.  Please make sure when you call that you mention that you are scheduling your Annual Wellness Visit with the clinical pharmacist so that the appointment may be made for the proper length of time.     Continue current medications. Continue good therapeutic lifestyle changes which include good diet and exercise. Fall precautions discussed with patient. If an FOBT was given today- please return it to our front desk. If you are over 67 years old - you may need Prevnar 26 or the adult Pneumonia vaccine.  **Flu shots are available--- please call and schedule a FLU-CLINIC appointment**  After your visit with [place] today you will receive a survey in the mail or online from [employer] regarding your care with [place]. Please take a moment to fill this out. Your feedback is very  important to [place] as you can help [place] better understand your patient needs as well as improve your experience and satisfaction. WE CARE ABOUT YOU!!!   Continue to follow-up with the gastroenterologist Continue physical therapy to help her chronic low back pain Continue to drink plenty of fluids and stay well hydrated Always be careful do not put yourself at risk for falling Try to get some outside blood pressure readings and bring these by for review and continue to watch sodium intake We will arrange for you to get an appointment with the cardiologist

## 2016-12-22 NOTE — Progress Notes (Signed)
Subjective:    Patient ID: Lori Soto, female    DOB: Nov 11, 1935, 81 y.o.   MRN: [id number]  HPI  Pt here for follow up and management of chronic medical problems which includes hypertension. She is taking medication regularly.The patient has a history of chronic low back pain with severe scoliosis. She has been getting physical therapy with several visits [place] to help this. Her blood pressure today on 2 occasions was slightly elevated. She does complain of some low abdominal cramping at times. She had a sigmoidoscopy in March of this year. She has a history of ulcerative colitis. The patient is pleasant and alert and says the physical therapy has helped her tremendously with topical treatment massage and traction. She has a few more visits scheduled. As mentioned she has severe scoliosis and arthritis in her back surgery glad this has helped her. She denies any chest pain but does have occasional palpitations and is concerned somewhat about this because there is a history of hardening of the arteries in her family. She denies any shortness of breath. She does have an upcoming appointment with the gastroenterologist in September because of her history of ulcerative colitis and most recently 6 months ago did have an endoscopy and dilatation. She denies any nausea vomiting diarrhea blood in the stool or black tarry bowel movements. She is passing her water okay without problems.    Patient Active Problem List   Diagnosis Date Noted  . Osteopenia of the elderly 02/19/2014  . DDD (degenerative disc disease), lumbar 12/03/2013  . Scoliosis 12/03/2013  . Chronic LBP 05/14/2013  . IBS (irritable bowel syndrome) 11/08/2012  . UC (ulcerative colitis) ([place]) 04/23/2012  . Palpitation 10/26/2011  . Hypertension 10/26/2011  . Chronic ulcerative colitis ([place]) 03/23/2011   Outpatient Encounter Prescriptions as of 12/22/2016  Medication Sig  . acetaminophen (TYLENOL 8 HOUR ARTHRITIS PAIN) 650 MG  CR tablet Take 650 mg by mouth every 8 (eight) hours as needed. Patient states that she takes prn for arthritis in her knees.   . Bromelains 500 MG TABS Take 500 mg by mouth daily.  . Calcium Carbonate-Vitamin D (CALCIUM 600+D) 600-400 MG-UNIT per tablet Take 1 tablet by mouth every evening.  [name] 500 MG CAPS Take 1 capsule by mouth daily.   . Coenzyme Q10 (CO Q-10 PO) Take 1 tablet by mouth daily. With red yeast rice [employer] Brand  . COLLAGEN PO Take 1 tablet by mouth daily. This is known as 1-2-3  . Cyanocobalamin (VITAMIN B 12 PO) Take 1,000 mcg by mouth daily.   [name] docusate sodium (COLACE) 100 MG capsule Take 1 capsule (100 mg total) by mouth 2 (two) times daily.  [name] estradiol (ESTRACE) 1 MG tablet Take 1 mg by mouth every other day.   . Ginkgo Biloba 40 MG TABS Take 40 mg by mouth daily.   [name] glucosamine-chondroitin 500-400 MG tablet Take 1 tablet by mouth 2 (two) times daily.    [name] Hyaluronic Acid-Vitamin C (HYALURONIC ACID PO) Take 80 mg by mouth daily.  . Mesalamine (ASACOL HD) 800 MG TBEC Patient is to take 1 tablet by mouth twice a day. (Patient taking differently: Take 2 tablets by mouth daily. Patient is to take 1 tablet by mouth twice a day.)  . MILK THISTLE PO Take 100 mg by mouth daily.  . Misc Natural Products (BUTCHERS BROOM PO) Take 1 tablet by mouth daily.   . MULTIPLE VITAMINS PO Take 1 tablet by mouth daily. In  the mornings  . Nutritional Supplements (GRAPESEED EXTRACT PO) Take 60 mg by mouth daily.   [name] OAT BRAN SOLUBLE PO Take 1 tablet by mouth daily.  [name] OVER THE COUNTER MEDICATION Take 66 mg by mouth daily. Vision Gold Lutein  . OVER THE COUNTER MEDICATION Take 250 mg by mouth daily. Alphalipoic Acid 250 mg daily  . pantoprazole (PROTONIX) 40 MG tablet Take 1 tablet (40 mg total) by mouth daily before breakfast. (Patient taking differently: Take 40 mg by mouth every other day. )  . POTASSIUM GLUCONATE PO Take 99 mg by mouth daily. This is Chelated Potassium  -[employer] Pride  . pyridOXINE (VITAMIN B-6) 100 MG tablet Take 100 mg by mouth daily.   . Turmeric 500 MG CAPS Take 1 capsule by mouth 2 (two) times daily.   . Vitamins C E (CRANBERRY CONCENTRATE PO) Take 2,500 mg by mouth daily.   . Zinc 50 MG CAPS Take 50 mg by mouth daily.    No facility-administered encounter medications on file as of 12/22/2016.      Review of Systems  Constitutional: Negative.   HENT: Negative.   Respiratory: Negative.   Cardiovascular: Negative.   Gastrointestinal: Negative.        Lower left abdominal cramps at times - spoke with GI about this  Endocrine: Negative.   Genitourinary: Negative.   Musculoskeletal: Negative.   Skin: Negative.   Allergic/Immunologic: Negative.   Neurological: Negative.   Hematological: Negative.   Psychiatric/Behavioral: Negative.        Objective:   Physical Exam  Constitutional: She is oriented to person, place, and time. She appears well-developed and well-nourished. No distress.  Patient is pleasant and relaxed and in good spirits and feeling much better since she's had her physical therapy  HENT:  Head: Normocephalic and atraumatic.  Right Ear: External ear normal.  Left Ear: External ear normal.  Nose: Nose normal.  Mouth/Throat: Oropharynx is clear and moist.  Eyes: Pupils are equal, round, and reactive to light. Conjunctivae and EOM are normal. Right eye exhibits no discharge. Left eye exhibits no discharge. No scleral icterus.  She gets her eye exam regularly  Neck: Normal range of motion. Neck supple. No thyromegaly present.  No bruits thyromegaly or anterior cervical adenopathy  Cardiovascular: Normal rate, regular rhythm, normal heart sounds and intact distal pulses.   No murmur heard. The heart is regular at 60/m  Pulmonary/Chest: Effort normal and breath sounds normal. No respiratory distress. She has no wheezes. She has no rales.  Clear anteriorly and posteriorly  Abdominal: Soft. Bowel sounds are normal.  She exhibits no mass. There is no tenderness. There is no rebound and no guarding.  No abdominal tenderness liver or spleen enlargement bruits or inguinal adenopathy  Musculoskeletal: Normal range of motion. She exhibits no edema.  The patient moves slowly because of her scoliosis so as not to fall. As mentioned earlier she is doing much better with her getting physical therapy.  Lymphadenopathy:    She has no cervical adenopathy.  Neurological: She is alert and oriented to person, place, and time. She has normal reflexes. No cranial nerve deficit.  Skin: Skin is warm and dry. No rash noted.  Psychiatric: She has a normal mood and affect. Her behavior is normal. Judgment and thought content normal.  Nursing note and vitals reviewed.   BP (!) 144/79 (BP Location: Left Arm)   Pulse (!) 58   Temp (!) 97.2 F (36.2 C) (Oral)   Ht 5' 2"  (  1.575 m)   Wt 137 lb (62.1 kg)   BMI 25.06 kg/m        Assessment & Plan:  1. Essential hypertension -The blood pressure slightly up on 3 different occasions today. She says at home it usually runs in the 128 range over the 70s. She will bring some readings back for [place] to review and 3-4 weeks and have the nurse recheck her blood pressure here. She will continue to watch her sodium intake.  2. Vitamin D deficiency -Continue current treatment  3. Scoliosis of thoracic spine, unspecified scoliosis type -Continue physical therapy and fall prevention  4. Ulcerative pancolitis without complication ([place]) -Follow-up with Dr. Laural Golden every 6 months as planned  5. Palpitations -Appointment with cardiology for evaluation  Patient Instructions                       Medicare Annual Wellness Visit  [facility] and the medical providers at [facility] strive to bring you the best medical care.  In doing so we not only want to address your current medical conditions and concerns but also to detect new conditions early and prevent illness,  disease and health-related problems.    Medicare offers a yearly Wellness Visit which allows our clinical staff to assess your need for preventative services including immunizations, lifestyle education, counseling to decrease risk of preventable diseases and screening for fall risk and other medical concerns.    This visit is provided free of charge (no copay) for all Medicare recipients. The clinical pharmacists at [facility] have begun to conduct these Wellness Visits which will also include a thorough review of all your medications.    As you primary medical provider recommend that you make an appointment for your Annual Wellness Visit if you have not done so already this year.  You may set up this appointment before you leave today or you may call back ([phone]) and schedule an appointment.  Please make sure when you call that you mention that you are scheduling your Annual Wellness Visit with the clinical pharmacist so that the appointment may be made for the proper length of time.     Continue current medications. Continue good therapeutic lifestyle changes which include good diet and exercise. Fall precautions discussed with patient. If an FOBT was given today- please return it to our front desk. If you are over 48 years old - you may need Prevnar 75 or the adult Pneumonia vaccine.  **Flu shots are available--- please call and schedule a FLU-CLINIC appointment**  After your visit with [place] today you will receive a survey in the mail or online from [employer] regarding your care with [place]. Please take a moment to fill this out. Your feedback is very important to [place] as you can help [place] better understand your patient needs as well as improve your experience and satisfaction. WE CARE ABOUT YOU!!!   Continue to follow-up with the gastroenterologist Continue physical therapy to help her chronic low back pain Continue to drink plenty of fluids and stay well hydrated Always  be careful do not put yourself at risk for falling Try to get some outside blood pressure readings and bring these by for review and continue to watch sodium intake We will arrange for you to get an appointment with the cardiologist   Arrie Senate MD

## 2016-12-22 NOTE — Therapy (Signed)
[facility] [place], [place], [address] Phone: [phone]   Fax:  [phone]  Physical Therapy Treatment  Patient Details  Name: Lori Soto MRN: [id number] Date of Birth: 1935-11-07 Referring Provider: Rosana Berger MD  Encounter Date: 12/22/2016      PT End of Session - 12/22/16 1347    Visit Number 24   Number of Visits 24   Date for PT Re-Evaluation 01/05/17   Authorization Type Gcode 20th visit.Marland KitchenMarland KitchenMarland KitchenKX modifier.   PT Start Time 1353  2 units secondary to needing to leave for appointment   PT Stop Time 1437   PT Time Calculation (min) 44 min   Activity Tolerance Patient tolerated treatment well   Behavior During Therapy [facility] for tasks assessed/performed      Past Medical History:  Diagnosis Date  . Anemia   . Arthritis   . Cancer ([place])    skin cancer on nose  . Carpal tunnel syndrome, bilateral   . Cataract   . Esophagitis   . GERD (gastroesophageal reflux disease)   . H/O hiatal hernia   . Osteopenia   . Scoliosis   . Shingles   . Shingles   . Ulcerative colitis     Past Surgical History:  Procedure Laterality Date  . ABDOMINAL HYSTERECTOMY    . BUNIONECTOMY     Bilateral  . CARPAL TUNNEL RELEASE Left 03/10/2015   Procedure: LEFT CARPAL TUNNEL RELEASE;  Surgeon: Daryll Brod, MD;  Location: [facility];  Service: Orthopedics;  Laterality: Left;  . CATARACT EXTRACTION W/PHACO  05/28/2012   Procedure: CATARACT EXTRACTION PHACO AND INTRAOCULAR LENS PLACEMENT (IOC);  Surgeon: Tonny Branch, MD;  Location: [facility];  Service: Ophthalmology;  Laterality: Right;  CDE=12.84  . CATARACT EXTRACTION W/PHACO  06/07/2012   Procedure: CATARACT EXTRACTION PHACO AND INTRAOCULAR LENS PLACEMENT (IOC);  Surgeon: Tonny Branch, MD;  Location: [facility];  Service: Ophthalmology;  Laterality: Left;  CDE 17.60  . COLONOSCOPY    . COLONOSCOPY N/A 07/08/2015   Procedure: COLONOSCOPY;  Surgeon: Rogene Houston, MD;   [facility];  Service: Endoscopy;  Laterality: N/A;  930  . ESOPHAGEAL DILATION N/A 07/08/2015   Procedure: ESOPHAGEAL DILATION;  Surgeon: Rogene Houston, MD;  [facility];  Service: Endoscopy;  Laterality: N/A;  . ESOPHAGOGASTRODUODENOSCOPY N/A 07/08/2015   Procedure: ESOPHAGOGASTRODUODENOSCOPY (EGD);  Surgeon: Rogene Houston, MD;  [facility];  Service: Endoscopy;  Laterality: N/A;  . Hernia     Right inguinal  . HERNIA REPAIR  1961   right inguinal hernia  . UPPER GASTROINTESTINAL ENDOSCOPY      There were no vitals filed for this visit.      Subjective Assessment - 12/22/16 1347    Subjective Reports that she was tired from a church function and had increased LBP during it. Reports a numb and stiff sensation today in low back.   Pertinent History Thoracic and lumbar scoliosis.  Small umbilical hernia that she has had for years.   Patient Stated Goals Reduce my pain.   Currently in Pain? Yes   Pain Score 8    Pain Location Back   Pain Orientation Right;Lower            [facility] PT Assessment - 12/22/16 0001      Assessment   Medical Diagnosis Thoracic scoloisis.     Precautions   Precautions None     Restrictions   Weight Bearing Restrictions No  [facility] Adult PT Treatment/Exercise - 12/22/16 0001      Modalities   Modalities Ultrasound;Traction     Ultrasound   Ultrasound Location R QL   Ultrasound Parameters 1.5 w/cm2,100%, 1 mhz x10 min   Ultrasound Goals Pain     Traction   Type of Traction Lumbar   Min (lbs) 5   Max (lbs) 85   Hold Time 99   Rest Time 5   Time 15     Manual Therapy   Manual Therapy Soft tissue mobilization   Soft tissue mobilization STW to R lumbar paraspinals, QL to promote reduction of pain and tightness in prone                  PT Short Term Goals - 10/03/16 1519      PT SHORT TERM GOAL #1   Title Ind with a HEP.   Time 2   Period Weeks   Status  Achieved           PT Long Term Goals - 10/14/16 1147      PT LONG TERM GOAL #1   Title Perform ADL's with pain not > 3/10.   Baseline 8/10 or more by lunch time   Time 8   Period Weeks   Status On-going     PT LONG TERM GOAL #2   Title Stand 45 minutes with pain not > 3-4/10.   Baseline limit is generally 2 hours and then must sit; standing 3 hours in morning it's 9/10   Period Weeks   Status On-going               Plan - 12/22/16 1512    Clinical Impression Statement Patient presented in clinic with continued increased LBP from prolonged activity yesterday. Patient had no complaints of discomfort or tenderness during any point in today's treatment. Traction was completed at 85# as well. Tightness still predominately along R QL region.   Rehab Potential Good   PT Frequency 2x / week   PT Duration 8 weeks   PT Treatment/Interventions ADLs/Self Care Home Management;Cryotherapy;Electrical Stimulation;Ultrasound;Traction;Moist Heat;Therapeutic activities;Therapeutic exercise;Manual techniques;Patient/family education;Passive range of motion   PT Next Visit Plan Continue with manual therapy and lumbar traction per MPT POC   PT Home Exercise Plan self care MFR   Consulted and Agree with Plan of Care Patient      Patient will benefit from skilled therapeutic intervention in order to improve the following deficits and impairments:  Decreased activity tolerance, Decreased range of motion, Pain, Postural dysfunction  Visit Diagnosis: Chronic bilateral low back pain without sciatica  Abnormal posture     Problem List Patient Active Problem List   Diagnosis Date Noted  . Osteopenia of the elderly 02/19/2014  . DDD (degenerative disc disease), lumbar 12/03/2013  . Scoliosis 12/03/2013  . Chronic LBP 05/14/2013  . IBS (irritable bowel syndrome) 11/08/2012  . UC (ulcerative colitis) ([place]) 04/23/2012  . Palpitation 10/26/2011  . Hypertension 10/26/2011  . Chronic  ulcerative colitis ([place]) 03/23/2011    Ahmed Prima, PTA 12/22/16 3:16 PM  [facility] [place], [place], [address] Phone: [phone]   Fax:  [phone]  Name: Evonne Rinks MRN: [id number] Date of Birth: 07/08/1935

## 2016-12-27 ENCOUNTER — Ambulatory Visit: Payer: Medicare Other | Admitting: Physical Therapy

## 2016-12-27 ENCOUNTER — Encounter: Payer: Self-pay | Admitting: Physical Therapy

## 2016-12-27 DIAGNOSIS — R293 Abnormal posture: Secondary | ICD-10-CM | POA: Diagnosis not present

## 2016-12-27 DIAGNOSIS — M546 Pain in thoracic spine: Secondary | ICD-10-CM

## 2016-12-27 DIAGNOSIS — G8929 Other chronic pain: Secondary | ICD-10-CM

## 2016-12-27 DIAGNOSIS — M545 Low back pain: Principal | ICD-10-CM

## 2016-12-27 NOTE — Therapy (Signed)
[place] S[place], [place], [address] Phone: [phone]   Fax:  [phone]  Physical Therapy Treatment  Patient Details  Name: Lori Soto MRN: [id number] Date of Birth: [birth year] Referring Provider: Rosana Berger MD  Encounter Date: 12/27/2016      PT End of Session - 12/27/16 1117    Visit Number 25   Number of Visits 30   Date for PT Re-Evaluation 01/26/17   Authorization Type Gcode 20th visit.Marland KitchenMarland KitchenMarland KitchenKX modifier.   PT Start Time 1118   PT Stop Time 1207   PT Time Calculation (min) 49 min   Activity Tolerance Patient tolerated treatment well   Behavior During Therapy WFL for tasks assessed/performed      Past Medical History:  Diagnosis Date  . Anemia   . Arthritis   . Cancer ([place])    skin cancer on nose  . Carpal tunnel syndrome, bilateral   . Cataract   . Esophagitis   . GERD (gastroesophageal reflux disease)   . H/O hiatal hernia   . Osteopenia   . Scoliosis   . Shingles   . Shingles   . Ulcerative colitis     Past Surgical History:  Procedure Laterality Date  . ABDOMINAL HYSTERECTOMY    . BUNIONECTOMY     Bilateral  . CARPAL TUNNEL RELEASE Left 03/10/2015   Procedure: LEFT CARPAL TUNNEL RELEASE;  Surgeon: Daryll Brod, MD;  Location: [facility];  Service: Orthopedics;  Laterality: Left;  . CATARACT EXTRACTION W/PHACO  05/28/2012   Procedure: CATARACT EXTRACTION PHACO AND INTRAOCULAR LENS PLACEMENT (IOC);  Surgeon: Tonny Branch, MD;  [facility];  Service: Ophthalmology;  Laterality: Right;  CDE=12.84  . CATARACT EXTRACTION W/PHACO  06/07/2012   Procedure: CATARACT EXTRACTION PHACO AND INTRAOCULAR LENS PLACEMENT (IOC);  Surgeon: Tonny Branch, MD;  [facility];  Service: Ophthalmology;  Laterality: Left;  CDE 17.60  . COLONOSCOPY    . COLONOSCOPY N/A 07/08/2015   Procedure: COLONOSCOPY;  Surgeon: Rogene Houston, MD;  [facility];  Service: Endoscopy;  Laterality: N/A;   930  . ESOPHAGEAL DILATION N/A 07/08/2015   Procedure: ESOPHAGEAL DILATION;  Surgeon: Rogene Houston, MD;  [facility];  Service: Endoscopy;  Laterality: N/A;  . ESOPHAGOGASTRODUODENOSCOPY N/A 07/08/2015   Procedure: ESOPHAGOGASTRODUODENOSCOPY (EGD);  Surgeon: Rogene Houston, MD;  [facility];  Service: Endoscopy;  Laterality: N/A;  . Hernia     Right inguinal  . HERNIA REPAIR  1961   right inguinal hernia  . UPPER GASTROINTESTINAL ENDOSCOPY      There were no vitals filed for this visit.      Subjective Assessment - 12/27/16 1116    Subjective Reports that upon waking this morning she felt good but went and picked in her garden and now has pain.   Pertinent History Thoracic and lumbar scoliosis.  Small umbilical hernia that she has had for years.   Patient Stated Goals Reduce my pain.   Currently in Pain? Yes   Pain Score 7    Pain Location Back   Pain Orientation Right;Lower   Pain Descriptors / Indicators Discomfort   Pain Type Chronic pain   Pain Onset More than a month ago            [facility] PT Assessment - 12/27/16 0001      Assessment   Medical Diagnosis Thoracic scoloisis.     Precautions   Precautions None  Restrictions   Weight Bearing Restrictions No                     [facility] Adult PT Treatment/Exercise - 12/27/16 0001      Modalities   Modalities Ultrasound;Traction     Ultrasound   Ultrasound Location R QL   Ultrasound Parameters 1.5 w/cm2, 100%, 1 mhz x10 min   Ultrasound Goals Pain     Traction   Type of Traction Lumbar   Min (lbs) 5   Max (lbs) 85   Hold Time 99   Rest Time 5   Time 15     Manual Therapy   Manual Therapy Soft tissue mobilization   Soft tissue mobilization STW to R lumbar paraspinals, QL to promote reduction of pain and tightness in prone                  PT Short Term Goals - 10/03/16 1519      PT SHORT TERM GOAL #1   Title Ind with a HEP.   Time 2   Period Weeks    Status Achieved           PT Long Term Goals - 10/14/16 1147      PT LONG TERM GOAL #1   Title Perform ADL's with pain not > 3/10.   Baseline 8/10 or more by lunch time   Time 8   Period Weeks   Status On-going     PT LONG TERM GOAL #2   Title Stand 45 minutes with pain not > 3-4/10.   Baseline limit is generally 2 hours and then must sit; standing 3 hours in morning it's 9/10   Period Weeks   Status On-going               Plan - 12/27/16 1154    Clinical Impression Statement Patient presented in clinic with continued R low back pain that was exaggerated by bending to pick items in her garden. Continued palpable muscle tightness palpated in R QL especially today. Normal modalities response noted today and still positive response with 85# traction.   Rehab Potential Good   PT Frequency 2x / week   PT Duration 8 weeks   PT Treatment/Interventions ADLs/Self Care Home Management;Cryotherapy;Electrical Stimulation;Ultrasound;Traction;Moist Heat;Therapeutic activities;Therapeutic exercise;Manual techniques;Patient/family education;Passive range of motion   PT Next Visit Plan Continue with manual therapy and lumbar traction per MPT POC   PT Home Exercise Plan self care MFR   Consulted and Agree with Plan of Care Patient      Patient will benefit from skilled therapeutic intervention in order to improve the following deficits and impairments:  Decreased activity tolerance, Decreased range of motion, Pain, Postural dysfunction  Visit Diagnosis: Chronic bilateral low back pain without sciatica  Abnormal posture  Pain in thoracic spine     Problem List Patient Active Problem List   Diagnosis Date Noted  . Osteopenia of the elderly 02/19/2014  . DDD (degenerative disc disease), lumbar 12/03/2013  . Scoliosis 12/03/2013  . Chronic LBP 05/14/2013  . IBS (irritable bowel syndrome) 11/08/2012  . UC (ulcerative colitis) ([place]) 04/23/2012  . Palpitation 10/26/2011  .  Hypertension 10/26/2011  . Chronic ulcerative colitis ([place]) 03/23/2011    Wynelle Fanny, PTA 12/27/2016, 12:11 PM  [facility] [address] [place], [place], [address] Phone: [phone]   Fax:  [phone]  Name: Lori Soto MRN: [id number] Date of Birth: Jun 11, 1935

## 2016-12-29 DIAGNOSIS — H903 Sensorineural hearing loss, bilateral: Secondary | ICD-10-CM | POA: Diagnosis not present

## 2017-01-03 ENCOUNTER — Ambulatory Visit: Payer: Medicare Other | Attending: Family Medicine | Admitting: Physical Therapy

## 2017-01-03 ENCOUNTER — Encounter: Payer: Self-pay | Admitting: Physical Therapy

## 2017-01-03 DIAGNOSIS — M546 Pain in thoracic spine: Secondary | ICD-10-CM | POA: Diagnosis not present

## 2017-01-03 DIAGNOSIS — G8929 Other chronic pain: Secondary | ICD-10-CM | POA: Insufficient documentation

## 2017-01-03 DIAGNOSIS — R293 Abnormal posture: Secondary | ICD-10-CM | POA: Diagnosis not present

## 2017-01-03 DIAGNOSIS — M545 Low back pain: Secondary | ICD-10-CM | POA: Insufficient documentation

## 2017-01-03 NOTE — Therapy (Signed)
[facility] [place], [place], [address] Phone: [phone]   Fax:  [phone]  Physical Therapy Treatment  Patient Details  Name: Charlcie Prisco MRN: [id number] Date of Birth: 23-Feb-1936 Referring Provider: Rosana Berger MD  Encounter Date: 01/03/2017      PT End of Session - 01/03/17 1116    Visit Number 26   Number of Visits 30   Date for PT Re-Evaluation 01/26/17   Authorization Type Gcode 20th visit.Marland KitchenMarland KitchenMarland KitchenKX modifier.   PT Start Time 1116   PT Stop Time 1204   PT Time Calculation (min) 48 min   Activity Tolerance Patient tolerated treatment well   Behavior During Therapy WFL for tasks assessed/performed      Past Medical History:  Diagnosis Date  . Anemia   . Arthritis   . Cancer ([place])    skin cancer on nose  . Carpal tunnel syndrome, bilateral   . Cataract   . Esophagitis   . GERD (gastroesophageal reflux disease)   . H/O hiatal hernia   . Osteopenia   . Scoliosis   . Shingles   . Shingles   . Ulcerative colitis     Past Surgical History:  Procedure Laterality Date  . ABDOMINAL HYSTERECTOMY    . BUNIONECTOMY     Bilateral  . CARPAL TUNNEL RELEASE Left 03/10/2015   Procedure: LEFT CARPAL TUNNEL RELEASE;  Surgeon: Daryll Brod, MD;  Location: [place];  Service: Orthopedics;  Laterality: Left;  . CATARACT EXTRACTION W/PHACO  05/28/2012   Procedure: CATARACT EXTRACTION PHACO AND INTRAOCULAR LENS PLACEMENT (IOC);  Surgeon: Tonny Branch, MD;  [facility];  Service: Ophthalmology;  Laterality: Right;  CDE=12.84  . CATARACT EXTRACTION W/PHACO  06/07/2012   Procedure: CATARACT EXTRACTION PHACO AND INTRAOCULAR LENS PLACEMENT (IOC);  Surgeon: Tonny Branch, MD;  [facility];  Service: Ophthalmology;  Laterality: Left;  CDE 17.60  . COLONOSCOPY    . COLONOSCOPY N/A 07/08/2015   Procedure: COLONOSCOPY;  Surgeon: Rogene Houston, MD;  [facility];  Service: Endoscopy;  Laterality: N/A;   930  . ESOPHAGEAL DILATION N/A 07/08/2015   Procedure: ESOPHAGEAL DILATION;  Surgeon: Rogene Houston, MD;  [facility];  Service: Endoscopy;  Laterality: N/A;  . ESOPHAGOGASTRODUODENOSCOPY N/A 07/08/2015   Procedure: ESOPHAGOGASTRODUODENOSCOPY (EGD);  Surgeon: Rogene Houston, MD;  [facility];  Service: Endoscopy;  Laterality: N/A;  . Hernia     Right inguinal  . HERNIA REPAIR  1961   right inguinal hernia  . UPPER GASTROINTESTINAL ENDOSCOPY      There were no vitals filed for this visit.      Subjective Assessment - 01/03/17 1116    Subjective Reports being stiff as she went one week without PT.   Pertinent History Thoracic and lumbar scoliosis.  Small umbilical hernia that she has had for years.   Patient Stated Goals Reduce my pain.   Currently in Pain? Yes   Pain Score 7    Pain Location Back   Pain Orientation Right;Lower   Pain Descriptors / Indicators Discomfort   Pain Type Chronic pain   Pain Onset More than a month ago            [facility] PT Assessment - 01/03/17 0001      Assessment   Medical Diagnosis Thoracic scoloisis.     Precautions   Precautions None     Restrictions   Weight Bearing Restrictions No  [facility] Adult PT Treatment/Exercise - 01/03/17 0001      Modalities   Modalities Ultrasound;Traction     Ultrasound   Ultrasound Location R QL   Ultrasound Parameters 1.5 w/cm2, 100%, 1 mhz x10 min   Ultrasound Goals Pain     Traction   Type of Traction Lumbar   Min (lbs) 5   Max (lbs) 85   Hold Time 99   Rest Time 5   Time 15     Manual Therapy   Manual Therapy Soft tissue mobilization   Soft tissue mobilization STW to R lumbar paraspinals, QL to promote reduction of pain and tightness in prone                  PT Short Term Goals - 10/03/16 1519      PT SHORT TERM GOAL #1   Title Ind with a HEP.   Time 2   Period Weeks   Status Achieved           PT Long Term  Goals - 10/14/16 1147      PT LONG TERM GOAL #1   Title Perform ADL's with pain not > 3/10.   Baseline 8/10 or more by lunch time   Time 8   Period Weeks   Status On-going     PT LONG TERM GOAL #2   Title Stand 45 minutes with pain not > 3-4/10.   Baseline limit is generally 2 hours and then must sit; standing 3 hours in morning it's 9/10   Period Weeks   Status On-going               Plan - 01/03/17 1156    Clinical Impression Statement Patient presented in clinic with low back stiffness and continued concentration of pain in R low back. Patient still very tight in R QL and patient experienced an area of soreness upon contact in the superior region of R QL. Normal modalities response and good response noted with 85# max traction today as well.      Rehab Potential Good   PT Frequency 2x / week   PT Duration 8 weeks   PT Treatment/Interventions ADLs/Self Care Home Management;Cryotherapy;Electrical Stimulation;Ultrasound;Traction;Moist Heat;Therapeutic activities;Therapeutic exercise;Manual techniques;Patient/family education;Passive range of motion   PT Next Visit Plan Continue with manual therapy and lumbar traction per MPT POC   PT Home Exercise Plan self care MFR   Consulted and Agree with Plan of Care Patient      Patient will benefit from skilled therapeutic intervention in order to improve the following deficits and impairments:  Decreased activity tolerance, Decreased range of motion, Pain, Postural dysfunction  Visit Diagnosis: Chronic bilateral low back pain without sciatica  Abnormal posture  Pain in thoracic spine     Problem List Patient Active Problem List   Diagnosis Date Noted  . Osteopenia of the elderly 02/19/2014  . DDD (degenerative disc disease), lumbar 12/03/2013  . Scoliosis 12/03/2013  . Chronic LBP 05/14/2013  . IBS (irritable bowel syndrome) 11/08/2012  . UC (ulcerative colitis) ([place]) 04/23/2012  . Palpitation 10/26/2011  .  Hypertension 10/26/2011  . Chronic ulcerative colitis ([place]) 03/23/2011    Wynelle Fanny, PTA 01/03/2017, 12:10 PM  [facility] [address] [place], [place], [address] Phone: [phone]   Fax:  [phone]  Name: Marranda Arakelian MRN: [id number] Date of Birth: October 27, 1935

## 2017-01-10 ENCOUNTER — Encounter: Payer: Self-pay | Admitting: Physical Therapy

## 2017-01-10 ENCOUNTER — Encounter (INDEPENDENT_AMBULATORY_CARE_PROVIDER_SITE_OTHER): Payer: Self-pay | Admitting: Internal Medicine

## 2017-01-10 ENCOUNTER — Ambulatory Visit: Payer: Medicare Other | Admitting: Physical Therapy

## 2017-01-10 ENCOUNTER — Ambulatory Visit (INDEPENDENT_AMBULATORY_CARE_PROVIDER_SITE_OTHER): Payer: Medicare Other | Admitting: Internal Medicine

## 2017-01-10 VITALS — BP 118/74 | HR 64 | Temp 98.3°F | Resp 18 | Ht 62.0 in | Wt 136.2 lb

## 2017-01-10 DIAGNOSIS — K5100 Ulcerative (chronic) pancolitis without complications: Secondary | ICD-10-CM

## 2017-01-10 DIAGNOSIS — K2100 Gastro-esophageal reflux disease with esophagitis, without bleeding: Secondary | ICD-10-CM

## 2017-01-10 DIAGNOSIS — R293 Abnormal posture: Secondary | ICD-10-CM

## 2017-01-10 DIAGNOSIS — G8929 Other chronic pain: Secondary | ICD-10-CM | POA: Diagnosis not present

## 2017-01-10 DIAGNOSIS — M545 Low back pain: Principal | ICD-10-CM

## 2017-01-10 DIAGNOSIS — M546 Pain in thoracic spine: Secondary | ICD-10-CM

## 2017-01-10 MED ORDER — MESALAMINE 800 MG PO TBEC: 1600.0000 mg | DELAYED_RELEASE_TABLET | Freq: Every day | ORAL | 3 refills | Status: DC

## 2017-01-10 NOTE — Progress Notes (Signed)
Presenting complaint;  Follow-up for ulcerative colitis and GERD.  Database and Subjective:  Lori Soto is 81 year old Caucasian female who is here for scheduled visit. She was last seen on 07/05/2016. She has over 10 year history of ulcerative colitis. Last surveillance colonoscopy was in March 2017. She also had EGD with ED at the time of colonoscopy because of dysphagia. She was found to have erosive reflux esophagitis with a stricture and moderate hiatal hernia. Stricture was dilated.  She feels well. She has one to 2 formed stools per day. She does not recall the last time she had diarrhea or rectal bleeding. She also denies melena. She has very good appetite. She feels heartburn is well controlled with therapy. She denies dysphagia nausea or vomiting.    Current Medications: Outpatient Encounter Prescriptions as of 01/10/2017  Medication Sig  . acetaminophen (TYLENOL 8 HOUR ARTHRITIS PAIN) 650 MG CR tablet Take 650 mg by mouth every 8 (eight) hours as needed. Patient states that she takes prn for arthritis in her knees.   . Bromelains 500 MG TABS Take 500 mg by mouth daily.  . Calcium Carbonate-Vitamin D (CALCIUM 600+D) 600-400 MG-UNIT per tablet Take 1 tablet by mouth every evening.  [name] 500 MG CAPS Take 1 capsule by mouth daily.   . Coenzyme Q10 (CO Q-10 PO) Take 1 tablet by mouth daily. With red yeast rice [employer] Brand  . COLLAGEN PO Take 1 tablet by mouth daily. This is known as 1-2-3  . Cyanocobalamin (VITAMIN B 12 PO) Take 1,000 mcg by mouth daily.   [name] docusate sodium (COLACE) 100 MG capsule Take 1 capsule (100 mg total) by mouth 2 (two) times daily.  [name] estradiol (ESTRACE) 1 MG tablet Take 1 mg by mouth every other day.   . Ginkgo Biloba 40 MG TABS Take 40 mg by mouth daily.   [name] glucosamine-chondroitin 500-400 MG tablet Take 1 tablet by mouth 2 (two) times daily.    [name] Hyaluronic Acid-Vitamin C (HYALURONIC ACID PO) Take 80 mg by mouth daily.  . Mesalamine (ASACOL HD)  800 MG TBEC Patient is to take 1 tablet by mouth twice a day. (Patient taking differently: Take 2 tablets by mouth daily. Patient is to take 1 tablet by mouth twice a day.)  . MILK THISTLE PO Take 100 mg by mouth daily.  . Misc Natural Products (BUTCHERS BROOM PO) Take 1 tablet by mouth daily.   . MULTIPLE VITAMINS PO Take 1 tablet by mouth daily. In the mornings  . Nutritional Supplements (GRAPESEED EXTRACT PO) Take 60 mg by mouth daily.   [name] OAT BRAN SOLUBLE PO Take 1 tablet by mouth daily.  [name] OVER THE COUNTER MEDICATION Take 66 mg by mouth daily. Vision Gold Lutein  . OVER THE COUNTER MEDICATION Take 250 mg by mouth daily. Alphalipoic Acid 250 mg daily  . pantoprazole (PROTONIX) 40 MG tablet Take 1 tablet (40 mg total) by mouth daily before breakfast. (Patient taking differently: Take 40 mg by mouth every other day. )  . POTASSIUM GLUCONATE PO Take 99 mg by mouth daily. This is Chelated Potassium -[employer]  . pyridOXINE (VITAMIN B-6) 100 MG tablet Take 100 mg by mouth daily.   . Turmeric 500 MG CAPS Take 1 capsule by mouth 2 (two) times daily.   . Vitamins C E (CRANBERRY CONCENTRATE PO) Take 2,500 mg by mouth daily.   . Zinc 50 MG CAPS Take 50 mg by mouth daily.    No facility-administered encounter medications  on file as of 01/10/2017.     Objective: Blood pressure 118/74, pulse 64, temperature 98.3 F (36.8 C), temperature source Oral, resp. rate 18, height 5' 2"  (1.575 m), weight 136 lb 3.2 oz (61.8 kg). Patient is alert and in no acute distress. Conjunctiva is pink. Sclera is nonicteric Oropharyngeal mucosa is normal. No neck masses or thyromegaly noted. Cardiac exam with regular rhythm normal S1 and S2. No murmur or gallop noted. Lungs are clear to auscultation. Abdomen is symmetrical soft and nontender without organomegaly or masses. No LE edema or clubbing noted.  Labs/studies Results: Lab data from 12/20/2016 reviewed  Glucose 84, BUN 10, creatinine 0.75, glucose 145.   Serum sodium 145, potassium 5.2, chloride 105, CO2 26  Bilirubin 0.5, AP 34, AST 19, ALT 12, total protein 6.9 and albumin of 4.2.  Calcium 9.9.   Assessment:  #1. Chronic ulcerative colitis. She is in remission. She is up-to-date on surveillance colonoscopy which was last year.  #2. Erosive GERD complicated by esophageal stricture. She also has a moderate-sized sliding hiatal hernia. She is doing well with therapy.  Plan:  New prescription given for Asacol HD 2 tablets every morning. 3 month supply with 3 refills. Patient will try decreasing pantoprazole to every other day starting in January 2019. She can go back to daily dose if symptoms relapse. Office visit in one year.

## 2017-01-10 NOTE — Therapy (Signed)
[facility] [place], [place], [address] Phone: [phone]   Fax:  [phone]  Physical Therapy Treatment  Patient Details  Name: Lori Soto MRN: [id number] Date of Birth: 19-Sep-1935 Referring Provider: Rosana Berger MD  Encounter Date: 01/10/2017      PT End of Session - 01/10/17 1120    Visit Number 27   Number of Visits 30   Date for PT Re-Evaluation 01/26/17   Authorization Type Gcode 20th visit.Marland KitchenMarland KitchenMarland KitchenKX modifier.   PT Start Time 1120   PT Stop Time 1209   PT Time Calculation (min) 49 min   Activity Tolerance Patient tolerated treatment well   Behavior During Therapy WFL for tasks assessed/performed      Past Medical History:  Diagnosis Date  . Anemia   . Arthritis   . Cancer ([place])    skin cancer on nose  . Carpal tunnel syndrome, bilateral   . Cataract   . Esophagitis   . GERD (gastroesophageal reflux disease)   . H/O hiatal hernia   . Osteopenia   . Scoliosis   . Shingles   . Shingles   . Ulcerative colitis     Past Surgical History:  Procedure Laterality Date  . ABDOMINAL HYSTERECTOMY    . BUNIONECTOMY     Bilateral  . CARPAL TUNNEL RELEASE Left 03/10/2015   Procedure: LEFT CARPAL TUNNEL RELEASE;  Surgeon: Daryll Brod, MD;  Location: [place];  Service: Orthopedics;  Laterality: Left;  . CATARACT EXTRACTION W/PHACO  05/28/2012   Procedure: CATARACT EXTRACTION PHACO AND INTRAOCULAR LENS PLACEMENT (IOC);  Surgeon: Tonny Branch, MD;  [facility];  Service: Ophthalmology;  Laterality: Right;  CDE=12.84  . CATARACT EXTRACTION W/PHACO  06/07/2012   Procedure: CATARACT EXTRACTION PHACO AND INTRAOCULAR LENS PLACEMENT (IOC);  Surgeon: Tonny Branch, MD;  [facility];  Service: Ophthalmology;  Laterality: Left;  CDE 17.60  . COLONOSCOPY    . COLONOSCOPY N/A 07/08/2015   Procedure: COLONOSCOPY;  Surgeon: Rogene Houston, MD;  [facility];  Service: Endoscopy;  Laterality: N/A;   930  . ESOPHAGEAL DILATION N/A 07/08/2015   Procedure: ESOPHAGEAL DILATION;  Surgeon: Rogene Houston, MD;  [facility];  Service: Endoscopy;  Laterality: N/A;  . ESOPHAGOGASTRODUODENOSCOPY N/A 07/08/2015   Procedure: ESOPHAGOGASTRODUODENOSCOPY (EGD);  Surgeon: Rogene Houston, MD;  [facility];  Service: Endoscopy;  Laterality: N/A;  . Hernia     Right inguinal  . HERNIA REPAIR  1961   right inguinal hernia  . UPPER GASTROINTESTINAL ENDOSCOPY      There were no vitals filed for this visit.      Subjective Assessment - 01/10/17 1119    Subjective Reports she has done a lot of sitting and resting while sitting with her friend at the hospital.   Pertinent History Thoracic and lumbar scoliosis.  Small umbilical hernia that she has had for years.   Patient Stated Goals Reduce my pain.   Currently in Pain? Yes   Pain Score 7    Pain Location Back   Pain Orientation Right;Lower   Pain Descriptors / Indicators Discomfort   Pain Type Chronic pain   Pain Onset More than a month ago            [facility] PT Assessment - 01/10/17 0001      Assessment   Medical Diagnosis Thoracic scoloisis.     Precautions   Precautions None     Restrictions  Weight Bearing Restrictions No                     [facility] Adult PT Treatment/Exercise - 01/10/17 0001      Modalities   Modalities Ultrasound;Traction     Ultrasound   Ultrasound Location R QL   Ultrasound Parameters 1.5 w/cm2, 100%, 1 mhz x10 min   Ultrasound Goals Pain     Traction   Type of Traction Lumbar   Min (lbs) 5   Max (lbs) 85   Hold Time 99   Rest Time 5   Time 15     Manual Therapy   Manual Therapy Soft tissue mobilization   Soft tissue mobilization STW to R lumbar paraspinals, QL to promote reduction of pain and tightness in prone                  PT Short Term Goals - 10/03/16 1519      PT SHORT TERM GOAL #1   Title Ind with a HEP.   Time 2   Period Weeks    Status Achieved           PT Long Term Goals - 10/14/16 1147      PT LONG TERM GOAL #1   Title Perform ADL's with pain not > 3/10.   Baseline 8/10 or more by lunch time   Time 8   Period Weeks   Status On-going     PT LONG TERM GOAL #2   Title Stand 45 minutes with pain not > 3-4/10.   Baseline limit is generally 2 hours and then must sit; standing 3 hours in morning it's 9/10   Period Weeks   Status On-going               Plan - 01/10/17 1206    Clinical Impression Statement Patient tolerated today's treatment well although R QL and lumbar paraspinals were sore and tender to palpation. R QL still tight upon palpation today. Normal modalities response noted following removal of the modalities.   Rehab Potential Good   PT Frequency 2x / week   PT Duration 8 weeks   PT Treatment/Interventions ADLs/Self Care Home Management;Cryotherapy;Electrical Stimulation;Ultrasound;Traction;Moist Heat;Therapeutic activities;Therapeutic exercise;Manual techniques;Patient/family education;Passive range of motion   PT Next Visit Plan Continue with manual therapy and lumbar traction per MPT POC. Patient requests for gym equiptment education next treatment.   PT Home Exercise Plan self care MFR   Consulted and Agree with Plan of Care Patient      Patient will benefit from skilled therapeutic intervention in order to improve the following deficits and impairments:  Decreased activity tolerance, Decreased range of motion, Pain, Postural dysfunction  Visit Diagnosis: Chronic bilateral low back pain without sciatica  Abnormal posture  Pain in thoracic spine     Problem List Patient Active Problem List   Diagnosis Date Noted  . Osteopenia of the elderly 02/19/2014  . DDD (degenerative disc disease), lumbar 12/03/2013  . Scoliosis 12/03/2013  . Chronic LBP 05/14/2013  . IBS (irritable bowel syndrome) 11/08/2012  . UC (ulcerative colitis) ([place]) 04/23/2012  . Palpitation 10/26/2011   . Hypertension 10/26/2011  . Chronic ulcerative colitis ([place]) 03/23/2011    Wynelle Fanny, PTA 01/10/2017, 12:13 PM  [facility] [address] [place], [place], [address] Phone: [phone]   Fax:  [phone]  Name: Leonila Speranza MRN: [id number] Date of Birth: 03-03-1936

## 2017-01-10 NOTE — Patient Instructions (Signed)
Can try pantoprazole every other day starting in January 2019.

## 2017-01-12 DIAGNOSIS — H35033 Hypertensive retinopathy, bilateral: Secondary | ICD-10-CM | POA: Diagnosis not present

## 2017-01-12 DIAGNOSIS — H524 Presbyopia: Secondary | ICD-10-CM | POA: Diagnosis not present

## 2017-01-17 ENCOUNTER — Encounter: Payer: Self-pay | Admitting: Physical Therapy

## 2017-01-17 ENCOUNTER — Ambulatory Visit: Payer: Medicare Other | Admitting: Physical Therapy

## 2017-01-17 DIAGNOSIS — R293 Abnormal posture: Secondary | ICD-10-CM

## 2017-01-17 DIAGNOSIS — M546 Pain in thoracic spine: Secondary | ICD-10-CM

## 2017-01-17 DIAGNOSIS — G8929 Other chronic pain: Secondary | ICD-10-CM

## 2017-01-17 DIAGNOSIS — M545 Low back pain: Secondary | ICD-10-CM | POA: Diagnosis not present

## 2017-01-17 NOTE — Therapy (Signed)
[facility] [place], [place], [address] Phone: [phone]   Fax:  [phone]  Physical Therapy Treatment  Patient Details  Name: Lori Soto MRN: [id number] Date of Birth: 07-Feb-1936 Referring Provider: Rosana Berger MD  Encounter Date: 01/17/2017      PT End of Session - 01/17/17 1119    Visit Number 28   Number of Visits 30   Date for PT Re-Evaluation 01/26/17   Authorization Type Gcode 20th visit.Marland KitchenMarland KitchenMarland KitchenKX modifier.   PT Start Time 1117   PT Stop Time 1208   PT Time Calculation (min) 51 min   Activity Tolerance Patient tolerated treatment well   Behavior During Therapy WFL for tasks assessed/performed      Past Medical History:  Diagnosis Date  . Anemia   . Arthritis   . Cancer ([place])    skin cancer on nose  . Carpal tunnel syndrome, bilateral   . Cataract   . Esophagitis   . GERD (gastroesophageal reflux disease)   . H/O hiatal hernia   . Osteopenia   . Scoliosis   . Shingles   . Shingles   . Ulcerative colitis     Past Surgical History:  Procedure Laterality Date  . ABDOMINAL HYSTERECTOMY    . BUNIONECTOMY     Bilateral  . CARPAL TUNNEL RELEASE Left 03/10/2015   Procedure: LEFT CARPAL TUNNEL RELEASE;  Surgeon: Daryll Brod, MD;  Location: [place];  Service: Orthopedics;  Laterality: Left;  . CATARACT EXTRACTION W/PHACO  05/28/2012   Procedure: CATARACT EXTRACTION PHACO AND INTRAOCULAR LENS PLACEMENT (IOC);  Surgeon: Tonny Branch, MD;  [facility];  Service: Ophthalmology;  Laterality: Right;  CDE=12.84  . CATARACT EXTRACTION W/PHACO  06/07/2012   Procedure: CATARACT EXTRACTION PHACO AND INTRAOCULAR LENS PLACEMENT (IOC);  Surgeon: Tonny Branch, MD;  [facility];  Service: Ophthalmology;  Laterality: Left;  CDE 17.60  . COLONOSCOPY    . COLONOSCOPY N/A 07/08/2015   Procedure: COLONOSCOPY;  Surgeon: Rogene Houston, MD;  [facility];  Service: Endoscopy;  Laterality: N/A;   930  . ESOPHAGEAL DILATION N/A 07/08/2015   Procedure: ESOPHAGEAL DILATION;  Surgeon: Rogene Houston, MD;  [facility];  Service: Endoscopy;  Laterality: N/A;  . ESOPHAGOGASTRODUODENOSCOPY N/A 07/08/2015   Procedure: ESOPHAGOGASTRODUODENOSCOPY (EGD);  Surgeon: Rogene Houston, MD;  [facility];  Service: Endoscopy;  Laterality: N/A;  . Hernia     Right inguinal  . HERNIA REPAIR  1961   right inguinal hernia  . UPPER GASTROINTESTINAL ENDOSCOPY      There were no vitals filed for this visit.      Subjective Assessment - 01/17/17 1115    Subjective Reports greater stiffness in low back today. Interested in trying gym equipment.   Pertinent History Thoracic and lumbar scoliosis.  Small umbilical hernia that she has had for years.   Patient Stated Goals Reduce my pain.   Currently in Pain? Yes   Pain Score 7    Pain Location Back   Pain Orientation Right;Lower   Pain Descriptors / Indicators Discomfort   Pain Type Chronic pain   Pain Onset More than a month ago            [facility] PT Assessment - 01/17/17 0001      Assessment   Medical Diagnosis Thoracic scoloisis.     Precautions   Precautions None     Restrictions   Weight Bearing Restrictions No  [facility] Adult PT Treatment/Exercise - 01/17/17 0001      Exercises   Exercises Knee/Hip;Lumbar     Lumbar Exercises: Stretches   Standing Side Bend 4 reps;10 seconds  to L for R low back stretch     Lumbar Exercises: Aerobic   Stationary Bike Nustep L3 x4.56 min     Lumbar Exercises: Machines for Strengthening   Cybex Lumbar Extension x20 reps with prolonged holds for stretch 50#      Lumbar Exercises: Seated   Other Seated Lumbar Exercises Row pink XTS x20 reps seated, seated snow angels with vertical bolster x20 reps   Other Seated Lumbar Exercises X to V with vertical bolster x20 reps     Modalities   Modalities Traction     Traction   Type of Traction  Lumbar   Min (lbs) 5   Max (lbs) 85   Hold Time 99   Rest Time 5   Time 15     Manual Therapy   Manual Therapy Soft tissue mobilization   Soft tissue mobilization STW to B lumbar paraspinals, QL to promote reduction of pain and tightness in prone                PT Education - 01/17/17 1200    Education provided Yes   Education Details HEP- lumbar ext, side bend stretch, snow angels, thoracolumbar stretch, row with yellow theraband   Person(s) Educated Patient   Methods Explanation;Handout   Comprehension Verbalized understanding          PT Short Term Goals - 10/03/16 1519      PT SHORT TERM GOAL #1   Title Ind with a HEP.   Time 2   Period Weeks   Status Achieved           PT Long Term Goals - 10/14/16 1147      PT LONG TERM GOAL #1   Title Perform ADL's with pain not > 3/10.   Baseline 8/10 or more by lunch time   Time 8   Period Weeks   Status On-going     PT LONG TERM GOAL #2   Title Stand 45 minutes with pain not > 3-4/10.   Baseline limit is generally 2 hours and then must sit; standing 3 hours in morning it's 9/10   Period Weeks   Status On-going               Plan - 01/17/17 1202    Clinical Impression Statement Patient tolerated today's treatment fairly well today as she was interested in gym equipment today. Patient completed exercises with greater caution to not exaggerate B knee pain. Vertical bolster utilized for posture enforcement. Patient provided new HEP for home with education regarding technique and well as parameters. Traction maintained at 85# with no complaints following end of session.   Rehab Potential Good   PT Frequency 2x / week   PT Duration 8 weeks   PT Treatment/Interventions ADLs/Self Care Home Management;Cryotherapy;Electrical Stimulation;Ultrasound;Traction;Moist Heat;Therapeutic activities;Therapeutic exercise;Manual techniques;Patient/family education;Passive range of motion   PT Next Visit Plan Continue  with manual therapy and lumbar traction per MPT POC. Patient requests for gym equiptment education next treatment.   PT Home Exercise Plan self care MFR   Consulted and Agree with Plan of Care Patient      Patient will benefit from skilled therapeutic intervention in order to improve the following deficits and impairments:  Decreased activity tolerance, Decreased range of motion, Pain, Postural dysfunction  Visit  Diagnosis: Chronic bilateral low back pain without sciatica  Abnormal posture  Pain in thoracic spine     Problem List Patient Active Problem List   Diagnosis Date Noted  . Osteopenia of the elderly 02/19/2014  . DDD (degenerative disc disease), lumbar 12/03/2013  . Scoliosis 12/03/2013  . Chronic LBP 05/14/2013  . IBS (irritable bowel syndrome) 11/08/2012  . UC (ulcerative colitis) ([place]) 04/23/2012  . Palpitation 10/26/2011  . Hypertension 10/26/2011  . Chronic ulcerative colitis ([place]) 03/23/2011    Wynelle Fanny, PTA 01/17/2017, 12:13 PM  [facility] [address] [place], [place], [address] Phone: [phone]   Fax:  [phone]  Name: Lori Soto MRN: [id number] Date of Birth: 1935/12/17

## 2017-01-17 NOTE — Patient Instructions (Addendum)
Backward Bend (Standing)    Arch backward to make hollow of back deeper. Hold __5__ seconds. Repeat __10__ times per set. Do _2___ sets per session. Do __2-3__ sessions per day.  http://orth.exer.us/178   Copyright  VHI. All rights reserved.  Thoracolumbar Side-Bend: Single Arm (Standing)    Reach over head to other side with right arm until stretch is felt. Hold _30___ seconds. Relax. Repeat __3__ times per set. Do ____ sets per session. Do __2-3__ sessions per day.  http://orth.exer.us/262   Copyright  VHI. All rights reserved.  Mid-Back Stretch    Push chest toward floor, reaching forward as far as possible. Hold _30___ seconds. Repeat __3__ times per set. Do ____ sets per session. Do __2-3__ sessions per day.  http://orth.exer.us/130   Copyright  VHI. All rights reserved.  Thoracic Self-Mobilization (Sitting)    With small rolled towel at lower ribs level, gently lean back until stretch is felt and move your arms like making a snow angel. Relax. Repeat _10___ times per set. Do __2__ sets per session. Do __2-3__ sessions per day.  http://orth.exer.us/998   Copyright  VHI. All rights reserved.  Scapular Retraction: Bilateral    Facing anchor, pull arms back, bringing shoulder blades together. Repeat __10__ times per set. Do __2__ sets per session. Do _2-3___ sessions per day.  http://orth.exer.us/176   Copyright  VHI. All rights reserved.

## 2017-01-24 ENCOUNTER — Ambulatory Visit: Payer: Medicare Other | Admitting: Physical Therapy

## 2017-01-24 ENCOUNTER — Encounter: Payer: Self-pay | Admitting: Physical Therapy

## 2017-01-24 DIAGNOSIS — G8929 Other chronic pain: Secondary | ICD-10-CM | POA: Diagnosis not present

## 2017-01-24 DIAGNOSIS — M545 Low back pain, unspecified: Secondary | ICD-10-CM

## 2017-01-24 DIAGNOSIS — R293 Abnormal posture: Secondary | ICD-10-CM | POA: Diagnosis not present

## 2017-01-24 DIAGNOSIS — M546 Pain in thoracic spine: Secondary | ICD-10-CM

## 2017-01-24 NOTE — Therapy (Signed)
[facility] [place], [place], [address] Phone: [phone]   Fax:  [phone]  Physical Therapy Treatment  Patient Details  Name: Lori Soto MRN: [id number] Date of Birth: Mar 01, 1936 Referring Provider: Rosana Berger MD  Encounter Date: 01/24/2017      PT End of Session - 01/24/17 1119    Visit Number 29   Number of Visits 30   Date for PT Re-Evaluation 01/26/17   Authorization Type Gcode 20th visit.Marland KitchenMarland KitchenMarland KitchenKX modifier.   PT Start Time 1118   PT Stop Time 1208   PT Time Calculation (min) 50 min   Activity Tolerance Patient tolerated treatment well   Behavior During Therapy WFL for tasks assessed/performed      Past Medical History:  Diagnosis Date  . Anemia   . Arthritis   . Cancer ([place])    skin cancer on nose  . Carpal tunnel syndrome, bilateral   . Cataract   . Esophagitis   . GERD (gastroesophageal reflux disease)   . H/O hiatal hernia   . Osteopenia   . Scoliosis   . Shingles   . Shingles   . Ulcerative colitis     Past Surgical History:  Procedure Laterality Date  . ABDOMINAL HYSTERECTOMY    . BUNIONECTOMY     Bilateral  . CARPAL TUNNEL RELEASE Left 03/10/2015   Procedure: LEFT CARPAL TUNNEL RELEASE;  Surgeon: Daryll Brod, MD;  Location: [facility];  Service: Orthopedics;  Laterality: Left;  . CATARACT EXTRACTION W/PHACO  05/28/2012   Procedure: CATARACT EXTRACTION PHACO AND INTRAOCULAR LENS PLACEMENT (IOC);  Surgeon: Tonny Branch, MD;  Location: [facility];  Service: Ophthalmology;  Laterality: Right;  CDE=12.84  . CATARACT EXTRACTION W/PHACO  06/07/2012   Procedure: CATARACT EXTRACTION PHACO AND INTRAOCULAR LENS PLACEMENT (IOC);  Surgeon: Tonny Branch, MD;  Location: [facility];  Service: Ophthalmology;  Laterality: Left;  CDE 17.60  . COLONOSCOPY    . COLONOSCOPY N/A 07/08/2015   Procedure: COLONOSCOPY;  Surgeon: Rogene Houston, MD;  [facility];  Service: Endoscopy;  Laterality: N/A;   930  . ESOPHAGEAL DILATION N/A 07/08/2015   Procedure: ESOPHAGEAL DILATION;  Surgeon: Rogene Houston, MD;  [facility];  Service: Endoscopy;  Laterality: N/A;  . ESOPHAGOGASTRODUODENOSCOPY N/A 07/08/2015   Procedure: ESOPHAGOGASTRODUODENOSCOPY (EGD);  Surgeon: Rogene Houston, MD;  [facility];  Service: Endoscopy;  Laterality: N/A;  . Hernia     Right inguinal  . HERNIA REPAIR  1961   right inguinal hernia  . UPPER GASTROINTESTINAL ENDOSCOPY      There were no vitals filed for this visit.      Subjective Assessment - 01/24/17 1119    Subjective Reports her back isn't good today.   Pertinent History Thoracic and lumbar scoliosis.  Small umbilical hernia that she has had for years.   Patient Stated Goals Reduce my pain.   Currently in Pain? Yes   Pain Score 8    Pain Location Back   Pain Orientation Lower;Right   Pain Descriptors / Indicators Tightness   Pain Type Chronic pain   Pain Onset More than a month ago            [facility] PT Assessment - 01/24/17 0001      Assessment   Medical Diagnosis Thoracic scoloisis.     Precautions   Precautions None     Restrictions   Weight Bearing Restrictions No  [facility] Adult PT Treatment/Exercise - 01/24/17 0001      Lumbar Exercises: Machines for Strengthening   Cybex Lumbar Extension x5 min     Lumbar Exercises: Seated   Other Seated Lumbar Exercises Row with vertical bolster x20 reps, snow angels x20 reps with vertical bolster   Other Seated Lumbar Exercises X to V with vertical bolster x20 reps, B horizontal abduction red theraband x20 reps     Traction   Type of Traction Lumbar   Min (lbs) 5   Max (lbs) 85   Hold Time 99   Rest Time 5   Time 15     Manual Therapy   Manual Therapy Soft tissue mobilization   Soft tissue mobilization STW to R lumbar paraspinals, QL to promote reduction of pain and tightness in prone                  PT Short Term Goals  - 10/03/16 1519      PT SHORT TERM GOAL #1   Title Ind with a HEP.   Time 2   Period Weeks   Status Achieved           PT Long Term Goals - 10/14/16 1147      PT LONG TERM GOAL #1   Title Perform ADL's with pain not > 3/10.   Baseline 8/10 or more by lunch time   Time 8   Period Weeks   Status On-going     PT LONG TERM GOAL #2   Title Stand 45 minutes with pain not > 3-4/10.   Baseline limit is generally 2 hours and then must sit; standing 3 hours in morning it's 9/10   Period Weeks   Status On-going               Plan - 01/24/17 1219    Clinical Impression Statement Patient presented in clinic with increased low back pain today due to ADL completion this morning and busy schedule. Patient guided through back strengthening and postural strengthening. Vertical bolster utilized for tactile cueing for proper posture. Tightness palpated in R QL today. Patient reports compliance with HEP at home. Traction maintained at 85# today without complaint from patient.    Rehab Potential Good   PT Frequency 2x / week   PT Duration 8 weeks   PT Treatment/Interventions ADLs/Self Care Home Management;Cryotherapy;Electrical Stimulation;Ultrasound;Traction;Moist Heat;Therapeutic activities;Therapeutic exercise;Manual techniques;Patient/family education;Passive range of motion   PT Next Visit Plan Continue with manual therapy and lumbar traction per MPT POC. Patient requests for gym equiptment education next treatment with lat pulldown machine.   PT Home Exercise Plan self care MFR   Consulted and Agree with Plan of Care Patient      Patient will benefit from skilled therapeutic intervention in order to improve the following deficits and impairments:  Decreased activity tolerance, Decreased range of motion, Pain, Postural dysfunction  Visit Diagnosis: Chronic bilateral low back pain without sciatica  Abnormal posture  Pain in thoracic spine     Problem List Patient Active  Problem List   Diagnosis Date Noted  . Osteopenia of the elderly 02/19/2014  . DDD (degenerative disc disease), lumbar 12/03/2013  . Scoliosis 12/03/2013  . Chronic LBP 05/14/2013  . IBS (irritable bowel syndrome) 11/08/2012  . UC (ulcerative colitis) ([place]) 04/23/2012  . Palpitation 10/26/2011  . Hypertension 10/26/2011  . Chronic ulcerative colitis ([place]) 03/23/2011    Wynelle Fanny, PTA 01/24/2017, 12:22 PM  [facility] [address]  [place], [place], [address] Phone: [phone]   Fax:  [phone]  Name: Lori Soto MRN: [id number] Date of Birth: 15-Jul-1935

## 2017-01-31 ENCOUNTER — Ambulatory Visit: Payer: Medicare Other | Attending: Family Medicine | Admitting: Physical Therapy

## 2017-01-31 ENCOUNTER — Encounter: Payer: Self-pay | Admitting: Physical Therapy

## 2017-01-31 DIAGNOSIS — M25562 Pain in left knee: Secondary | ICD-10-CM | POA: Diagnosis not present

## 2017-01-31 DIAGNOSIS — R293 Abnormal posture: Secondary | ICD-10-CM | POA: Diagnosis not present

## 2017-01-31 DIAGNOSIS — M546 Pain in thoracic spine: Secondary | ICD-10-CM | POA: Insufficient documentation

## 2017-01-31 DIAGNOSIS — G8929 Other chronic pain: Secondary | ICD-10-CM | POA: Insufficient documentation

## 2017-01-31 DIAGNOSIS — M25561 Pain in right knee: Secondary | ICD-10-CM | POA: Insufficient documentation

## 2017-01-31 DIAGNOSIS — M545 Low back pain, unspecified: Secondary | ICD-10-CM

## 2017-01-31 NOTE — Therapy (Signed)
[facility] [place], [place], [address] Phone: [phone]   Fax:  [phone]  Physical Therapy Treatment Discharge Summary  Patient Details  Name: Lori Soto MRN: [id number] Date of Birth: May 27, 1935 Referring Provider: Rosana Berger MD  Encounter Date: 01/31/2017    Past Medical History:  Diagnosis Date  . Anemia   . Arthritis   . Cancer ([place])    skin cancer on nose  . Carpal tunnel syndrome, bilateral   . Cataract   . Esophagitis   . GERD (gastroesophageal reflux disease)   . H/O hiatal hernia   . Osteopenia   . Scoliosis   . Shingles   . Shingles   . Ulcerative colitis     Past Surgical History:  Procedure Laterality Date  . ABDOMINAL HYSTERECTOMY    . BUNIONECTOMY     Bilateral  . CARPAL TUNNEL RELEASE Left 03/10/2015   Procedure: LEFT CARPAL TUNNEL RELEASE;  Surgeon: Daryll Brod, MD;  Location: [facility];  Service: Orthopedics;  Laterality: Left;  . CATARACT EXTRACTION W/PHACO  05/28/2012   Procedure: CATARACT EXTRACTION PHACO AND INTRAOCULAR LENS PLACEMENT (IOC);  Surgeon: Tonny Branch, MD;  Location: [facility];  Service: Ophthalmology;  Laterality: Right;  CDE=12.84  . CATARACT EXTRACTION W/PHACO  06/07/2012   Procedure: CATARACT EXTRACTION PHACO AND INTRAOCULAR LENS PLACEMENT (IOC);  Surgeon: Tonny Branch, MD;  [facility]: [facility];  Service: Ophthalmology;  Laterality: Left;  CDE 17.60  . COLONOSCOPY    . COLONOSCOPY N/A 07/08/2015   Procedure: COLONOSCOPY;  Surgeon: Rogene Houston, MD;  [facility];  Service: Endoscopy;  Laterality: N/A;  930  . ESOPHAGEAL DILATION N/A 07/08/2015   Procedure: ESOPHAGEAL DILATION;  Surgeon: Rogene Houston, MD;  [facility];  Service: Endoscopy;  Laterality: N/A;  . ESOPHAGOGASTRODUODENOSCOPY N/A 07/08/2015   Procedure: ESOPHAGOGASTRODUODENOSCOPY (EGD);  Surgeon: Rogene Houston, MD;  [facility];  Service: Endoscopy;  Laterality:  N/A;  . Hernia     Right inguinal  . HERNIA REPAIR  1961   right inguinal hernia  . UPPER GASTROINTESTINAL ENDOSCOPY      There were no vitals filed for this visit.      Subjective Assessment - 01/31/17 1121    Subjective Pt reporting after doing back extension last visit it put too much stress on her knees.    Pertinent History Thoracic and lumbar scoliosis.  Small umbilical hernia that she has had for years.   Patient Stated Goals Reduce my pain.   Pain Score 8    Pain Location Back   Pain Orientation Lower;Right   Pain Descriptors / Indicators Tightness   Pain Type Chronic pain   Pain Onset More than a month ago   Pain Frequency Constant   Aggravating Factors  prolonged   Pain Relieving Factors traction            [facility] PT Assessment - 01/31/17 0001      Assessment   Medical Diagnosis Thoracic scoloisis.     Precautions   Precautions None     Restrictions   Weight Bearing Restrictions No     Prior Function   Level of Independence Independent     Cognition   Overall Cognitive Status Within Functional Limits for tasks assessed     Observation/Other Assessments   Focus on Therapeutic Outcomes (FOTO)  55% limitation  pt was 64% limitation at her initial evaluation     Strength   Overall  Strength Comments Pt with bilateral LE strengthe grossly 4/5.      Palpation   Palpation comment Pt with tenderness noted in R QL     Ambulation/Gait   Gait Comments Pt amb with upright posture and step through gait pattern.                      [facility] Adult PT Treatment/Exercise - 01/31/17 0001      Lumbar Exercises: Machines for Strengthening   Cybex Lumbar Extension flexion x 20 with 40#   Other Lumbar Machine Exercise Lat Pull downs: 20# x 20 reps     Lumbar Exercises: Seated   Other Seated Lumbar Exercises Row with vertical bolster x20 reps, snow angels x20 reps with vertical bolster   Other Seated Lumbar Exercises X to V with vertical bolster x20  reps, B horizontal abduction red theraband x20 reps     Modalities   Modalities Traction     Traction   Type of Traction Lumbar   Min (lbs) 5   Max (lbs) 85   Hold Time 99   Rest Time 5   Time 20                PT Education - 01/31/17 1130    Education provided Yes   Education Details Edu on machines in gym  and discussed    Person(s) Educated Patient   Methods Explanation;Demonstration   Comprehension Returned demonstration;Verbalized understanding          PT Short Term Goals - 01/31/17 1149      PT SHORT TERM GOAL #1   Title Ind with a HEP.   Status Achieved           PT Long Term Goals - 01/31/17 1145      PT LONG TERM GOAL #1   Title Perform ADL's with pain not > 3/10.   Baseline Pt reporting pain never gets lower than a 5/10.    Status Not Met     PT LONG TERM GOAL #2   Baseline Pt reporting pain never gets under a 5/10. pt reporting unable to stand still for 45. Pt reporting constantly having to move.    Time 8   Period Weeks   Status Not Met     PT LONG TERM GOAL #3   Title Patient to report decreased B knee pain by 25% with ADLS   Baseline Pt reporting first thing in the morning having no knee pain with ADL's, but as the afternoon progresses pt reporting knee pain intermittently.   Time 6   Period Weeks   Status Achieved               Plan - 01/31/17 1228    Clinical Impression Statement Patient presented today afte 30 therapy visits still complaining of 8/10 back pain and intermittent bilateral knee pain. pt reporting following therapy treatments and traction pt reporting relief that day, but pain continues to retroduce it's self within 24-48 hours. Pt feels like therapy has significantly helped her bilateral knee pain where she is able to perform ADL's with pain less than 3/10. Pt has met 2/3 goals set at her initial evaluation. Pt also has improved her FOTO score from 64% limitation to 55% limitation. Pt to be discharged today due  to visit limitation.     PT Frequency 2x / week   PT Duration 8 weeks   PT Treatment/Interventions ADLs/Self Care Home Management;Cryotherapy;Electrical Stimulation;Ultrasound;Traction;Moist Heat;Therapeutic activities;Therapeutic exercise;Manual  techniques;Patient/family education;Passive range of motion   PT Next Visit Plan Continue with manual therapy and lumbar traction per MPT POC. Patient requests for gym equiptment education next treatment with lat pulldown machine.   PT Home Exercise Plan posture correction, machine usage so pt can join our gym program.    Consulted and Agree with Plan of Care Patient      Patient will benefit from skilled therapeutic intervention in order to improve the following deficits and impairments:  Decreased activity tolerance, Decreased range of motion, Pain, Postural dysfunction  Visit Diagnosis: Chronic bilateral low back pain without sciatica  Abnormal posture  Pain in thoracic spine  Chronic right-sided low back pain without sciatica  Chronic pain of left knee  Chronic pain of right knee  PHYSICAL THERAPY DISCHARGE SUMMARY  Visits from Start of Care: 30  Current functional level related to goals / functional outcomes: See above   Remaining deficits: See above   Education / Equipment: HEP, posture edu, gym program Plan: Patient agrees to discharge.  Patient goals were partially met. Patient is being discharged due to financial reasons.  ?????          G-Codes - 02-15-17 1221    Functional Assessment Tool Used (Outpatient Only) FOTO...59% limitation....[name]10th visit., FOTO 55% limitation at Discharge, clinical assessment   Functional Limitation Mobility: Walking and moving around   Mobility: Walking and Moving Around Current Status [phone]) At least 40 percent but less than 60 percent impaired, limited or restricted   Mobility: Walking and Moving Around Goal Status [phone]) At least 40 percent but less than 60 percent impaired, limited  or restricted   Mobility: Walking and Moving Around Discharge Status [phone]) At least 40 percent but less than 60 percent impaired, limited or restricted      Problem List Patient Active Problem List   Diagnosis Date Noted  . Osteopenia of the elderly 02/19/2014  . DDD (degenerative disc disease), lumbar 12/03/2013  . Scoliosis 12/03/2013  . Chronic LBP 05/14/2013  . IBS (irritable bowel syndrome) 11/08/2012  . UC (ulcerative colitis) ([place]) 04/23/2012  . Palpitation 10/26/2011  . Hypertension 10/26/2011  . Chronic ulcerative colitis ([place]) 03/23/2011    Oretha Caprice, MPT 2017/02/15, 12:37 PM  [facility] [place], [place], [address] Phone: [phone]   Fax:  [phone]  Name: Rahel Carlton MRN: [id number] Date of Birth: [birth year]

## 2017-02-15 DIAGNOSIS — M1811 Unilateral primary osteoarthritis of first carpometacarpal joint, right hand: Secondary | ICD-10-CM | POA: Diagnosis not present

## 2017-02-15 DIAGNOSIS — G5601 Carpal tunnel syndrome, right upper limb: Secondary | ICD-10-CM | POA: Diagnosis not present

## 2017-03-29 ENCOUNTER — Encounter: Payer: Self-pay | Admitting: Cardiology

## 2017-03-29 DIAGNOSIS — Z85828 Personal history of other malignant neoplasm of skin: Secondary | ICD-10-CM | POA: Diagnosis not present

## 2017-03-29 DIAGNOSIS — L249 Irritant contact dermatitis, unspecified cause: Secondary | ICD-10-CM | POA: Diagnosis not present

## 2017-04-04 ENCOUNTER — Encounter: Payer: Self-pay | Admitting: Cardiology

## 2017-04-04 NOTE — Progress Notes (Signed)
Cardiology Office Note   Date:  04/05/2017   ID:  Lori Soto, DOB 01-Mar-1936, MRN [id number]  PCP:  Chipper Herb, MD  Cardiologist:   Minus Breeding, MD  Referring:  Chipper Herb, MD  Chief Complaint  Patient presents with  . Palpitations      History of Present Illness: Lori Soto is a 81 y.o. female who presents for evaluation of palpitations.   I saw her for evaluation of palpitations in 2014.  She has a heart murmur and an echo in 2011 was normal.  If the palpitations she is presenting for now actually happened in April.  He felt her heart racing and she was dizzy.  She was getting ready for church on Sunday when this happened.  She sat down and symptoms lasted for about 10 minutes.  She recovered and went on to church.  Has not had any episodes since then.  She is active.  She does some arm exercises at physical therapy.  She does stairs.  She does her household chores and shots.  She is limited slightly by knee pain.  The patient denies any new symptoms such as chest discomfort, neck or arm discomfort. There has been no new shortness of breath, PND or orthopnea. There have been no reported palpitations, presyncope or syncope.     Past Medical History:  Diagnosis Date  . Anemia   . Arthritis   . Cancer ([place])    skin cancer on nose  . Carpal tunnel syndrome, bilateral   . Cataract   . Esophagitis   . GERD (gastroesophageal reflux disease)   . H/O hiatal hernia   . Osteopenia   . Scoliosis   . Shingles   . Ulcerative colitis     Past Surgical History:  Procedure Laterality Date  . ABDOMINAL HYSTERECTOMY    . BUNIONECTOMY     Bilateral  . CARPAL TUNNEL RELEASE Left 03/10/2015   Procedure: LEFT CARPAL TUNNEL RELEASE;  Surgeon: Daryll Brod, MD;  Location: [facility];  Service: Orthopedics;  Laterality: Left;  . CATARACT EXTRACTION W/PHACO  05/28/2012   Procedure: CATARACT EXTRACTION PHACO AND INTRAOCULAR LENS PLACEMENT (IOC);  Surgeon:  Tonny Branch, MD;  Location: [facility];  Service: Ophthalmology;  Laterality: Right;  CDE=12.84  . CATARACT EXTRACTION W/PHACO  06/07/2012   Procedure: CATARACT EXTRACTION PHACO AND INTRAOCULAR LENS PLACEMENT (IOC);  Surgeon: Tonny Branch, MD;  Location: [facility];  Service: Ophthalmology;  Laterality: Left;  CDE 17.60  . COLONOSCOPY    . COLONOSCOPY N/A 07/08/2015   Procedure: COLONOSCOPY;  Surgeon: Rogene Houston, MD;  [facility];  Service: Endoscopy;  Laterality: N/A;  930  . ESOPHAGEAL DILATION N/A 07/08/2015   Procedure: ESOPHAGEAL DILATION;  Surgeon: Rogene Houston, MD;  [facility];  Service: Endoscopy;  Laterality: N/A;  . ESOPHAGOGASTRODUODENOSCOPY N/A 07/08/2015   Procedure: ESOPHAGOGASTRODUODENOSCOPY (EGD);  Surgeon: Rogene Houston, MD;  [facility];  Service: Endoscopy;  Laterality: N/A;  . Hernia     Right inguinal  . HERNIA REPAIR  1961   right inguinal hernia  . UPPER GASTROINTESTINAL ENDOSCOPY       Current Outpatient Medications  Medication Sig Dispense Refill  . acetaminophen (TYLENOL 8 HOUR ARTHRITIS PAIN) 650 MG CR tablet Take 650 mg by mouth every 8 (eight) hours as needed. Patient states that she takes prn for arthritis in her knees.     . Bromelains 500 MG TABS  Take 500 mg by mouth daily.    . Calcium Carbonate-Vitamin D (CALCIUM 600+D) 600-400 MG-UNIT per tablet Take 1 tablet by mouth every evening.    [name] 500 MG CAPS Take 1 capsule by mouth daily.     . Coenzyme Q10 (CO Q-10 PO) Take 1 tablet by mouth daily. With red yeast rice [employer]    . COLLAGEN PO Take 1 tablet by mouth daily. This is known as 1-2-3    . Cyanocobalamin (VITAMIN B 12 PO) Take 1,000 mcg by mouth daily.     [name] docusate sodium (COLACE) 100 MG capsule Take 1 capsule (100 mg total) by mouth 2 (two) times daily. 1 capsule 0  . estradiol (ESTRACE) 1 MG tablet Take 1 mg by mouth every other day.     . Ginkgo Biloba 40 MG TABS Take 40 mg by mouth daily.     [name]  glucosamine-chondroitin 500-400 MG tablet Take 1 tablet by mouth 2 (two) times daily.      [name] Hyaluronic Acid-Vitamin C (HYALURONIC ACID PO) Take 80 mg by mouth daily.    . Mesalamine (ASACOL HD) 800 MG TBEC Take 2 tablets (1,600 mg total) by mouth daily. 180 tablet 3  . MILK THISTLE PO Take 100 mg by mouth daily.    . Misc Natural Products (BUTCHERS BROOM PO) Take 1 tablet by mouth daily.     . MULTIPLE VITAMINS PO Take 1 tablet by mouth daily. In the mornings    . Nutritional Supplements (GRAPESEED EXTRACT PO) Take 60 mg by mouth daily.     [name] OAT BRAN SOLUBLE PO Take 1 tablet by mouth daily.    [name] OVER THE COUNTER MEDICATION Take 66 mg by mouth daily. Vision Gold Lutein    . OVER THE COUNTER MEDICATION Take 250 mg by mouth daily. Alphalipoic Acid 250 mg daily    . pantoprazole (PROTONIX) 40 MG tablet Take 1 tablet (40 mg total) by mouth daily before breakfast. (Patient taking differently: Take 40 mg by mouth every other day. ) 90 tablet 3  . POTASSIUM GLUCONATE PO Take 99 mg by mouth daily. This is Chelated Potassium -[employer]    . pyridOXINE (VITAMIN B-6) 100 MG tablet Take 100 mg by mouth daily.     . Turmeric 500 MG CAPS Take 1 capsule by mouth 2 (two) times daily.     . Vitamins C E (CRANBERRY CONCENTRATE PO) Take 2,500 mg by mouth daily.     . Zinc 50 MG CAPS Take 50 mg by mouth daily.      No current facility-administered medications for this visit.     Allergies:   Penicillins and Influenza virus vacc split pf    Social History:  The patient  reports that  has never smoked. she has never used smokeless tobacco. She reports that she drinks alcohol. She reports that she does not use drugs.   Family History:  The patient's family history includes Arthritis in her mother; COPD in her sister; Cancer in her brother and father; Colon cancer in her sister; Congestive Heart Failure in her sister; Diabetes in her sister and sister; Edema in her sister; GI problems in her sister; Heart  disease in her sister; Liver cancer in her brother; Lung cancer in her brother; Neuropathy in her sister; Pancreatic cancer in her father; Peptic Ulcer in her sister.    ROS:  Please see the history of present illness.   Otherwise, review of systems are positive  for none.   All other systems are reviewed and negative.    PHYSICAL EXAM: VS:  BP (!) 157/84   Pulse 62   Ht 5' 2"  (1.575 m)   Wt 136 lb (61.7 kg)   SpO2 97%   BMI 24.87 kg/m  , BMI Body mass index is 24.87 kg/m. GENERAL:  Well appearing HEENT:  Pupils equal round and reactive, fundi not visualized, oral mucosa unremarkable NECK:  No jugular venous distention, waveform within normal limits, carotid upstroke brisk and symmetric, no bruits, no thyromegaly LYMPHATICS:  No cervical, inguinal adenopathy LUNGS:  Clear to auscultation bilaterally BACK:  No CVA tenderness CHEST:  Unremarkable HEART:  PMI not displaced or sustained,S1 and S2 within normal limits, no S3, no S4, no clicks, no rubs, no murmurs ABD:  Flat, positive bowel sounds normal in frequency in pitch, no bruits, no rebound, no guarding, no midline pulsatile mass, no hepatomegaly, no splenomegaly EXT:  2 plus pulses throughout, no edema, no cyanosis no clubbing SKIN:  No rashes no nodules NEURO:  Cranial nerves II through XII grossly intact, motor grossly intact throughout PSYCH:  Cognitively intact, oriented to person place and time    EKG:  EKG is ordered today. The ekg ordered today demonstrates sinus rhythm, rate 62, rightward axis, intervals within.   Recent Labs: 06/09/2016: Hemoglobin 13.5; Platelets 264 12/20/2016: ALT 12; BUN 10; Creatinine, Ser 0.75; Magnesium 1.9; Potassium 5.2; Sodium 145    Lipid Panel    Component Value Date/Time   CHOL 189 12/20/2016 0906   TRIG 66 12/20/2016 0906   TRIG 79 12/18/2015 1105   HDL 69 12/20/2016 0906   HDL 65 12/18/2015 1105   CHOLHDL 2.7 12/20/2016 0906   LDLCALC 107 (H) 12/20/2016 0906   LDLCALC 109 (H)  11/29/2013 0822      Wt Readings from Last 3 Encounters:  04/05/17 136 lb (61.7 kg)  01/10/17 136 lb 3.2 oz (61.8 kg)  12/22/16 137 lb (62.1 kg)      Other studies Reviewed: Additional studies/ records that were reviewed today include: None. Review of the above records demonstrates:  Please see elsewhere in the note.     ASSESSMENT AND PLAN:  PALPITATIONS: She had an isolated episode of these.  She otherwise has had no symptoms.  He is physically active and does not have significant risk factors.  Her exam is unremarkable.  The EKG is essentially unremarkable.  I would not suggest further cardiovascular testing based on this unless she has recurrent symptoms.  She will let me know if these recur.   Current medicines are reviewed at length with the patient today.  The patient does not have concerns regarding medicines.  The following changes have been made:  no change  Labs/ tests ordered today include: None  Orders Placed This Encounter  Procedures  . EKG 12-Lead     Disposition:   FU with me as needed.      Signed, Minus Breeding, MD  04/05/2017 3:26 PM    [facility]

## 2017-04-05 ENCOUNTER — Encounter: Payer: Self-pay | Admitting: Cardiology

## 2017-04-05 ENCOUNTER — Ambulatory Visit (INDEPENDENT_AMBULATORY_CARE_PROVIDER_SITE_OTHER): Payer: Medicare Other | Admitting: Cardiology

## 2017-04-05 VITALS — BP 157/84 | HR 62 | Ht 62.0 in | Wt 136.0 lb

## 2017-04-05 DIAGNOSIS — I10 Essential (primary) hypertension: Secondary | ICD-10-CM | POA: Diagnosis not present

## 2017-04-05 DIAGNOSIS — E785 Hyperlipidemia, unspecified: Secondary | ICD-10-CM

## 2017-04-05 NOTE — Patient Instructions (Signed)
Medication Instructions:  The current medical regimen is effective;  continue present plan and medications.  Follow-Up: Follow up as needed with Dr Percival Spanish.  Thank you for choosing [facility]!!

## 2017-06-06 ENCOUNTER — Telehealth: Payer: Self-pay | Admitting: Family Medicine

## 2017-06-06 NOTE — Telephone Encounter (Signed)
Pt aware either way is fine

## 2017-06-22 ENCOUNTER — Encounter: Payer: Self-pay | Admitting: Family Medicine

## 2017-06-22 ENCOUNTER — Ambulatory Visit (INDEPENDENT_AMBULATORY_CARE_PROVIDER_SITE_OTHER): Payer: Medicare Other | Admitting: Family Medicine

## 2017-06-22 VITALS — BP 142/83 | HR 65 | Temp 98.5°F | Ht 62.0 in | Wt 135.0 lb

## 2017-06-22 DIAGNOSIS — K5190 Ulcerative colitis, unspecified, without complications: Secondary | ICD-10-CM

## 2017-06-22 DIAGNOSIS — M5136 Other intervertebral disc degeneration, lumbar region: Secondary | ICD-10-CM

## 2017-06-22 DIAGNOSIS — Z8249 Family history of ischemic heart disease and other diseases of the circulatory system: Secondary | ICD-10-CM | POA: Diagnosis not present

## 2017-06-22 DIAGNOSIS — I10 Essential (primary) hypertension: Secondary | ICD-10-CM | POA: Diagnosis not present

## 2017-06-22 DIAGNOSIS — M51369 Other intervertebral disc degeneration, lumbar region without mention of lumbar back pain or lower extremity pain: Secondary | ICD-10-CM

## 2017-06-22 DIAGNOSIS — E559 Vitamin D deficiency, unspecified: Secondary | ICD-10-CM | POA: Diagnosis not present

## 2017-06-22 DIAGNOSIS — M419 Scoliosis, unspecified: Secondary | ICD-10-CM | POA: Diagnosis not present

## 2017-06-22 NOTE — Progress Notes (Signed)
Subjective:    Patient ID: Lori Soto, female    DOB: 1936-04-26, 82 y.o.   MRN: [id number]  HPI Pt here for follow up and management of chronic medical problems which includes hypertension. She is taking medication regularly.  The patient has no specific complaints today.  She is followed regularly by the cardiologist.  She goes every 2 years to her gynecologist, Dr. Adah Perl.  She is also followed regularly by the gastroenterologist.  She is due to return in FOBT and get lab work today.  She brings in outside blood pressures for review and the majority of these are running between 120 and 130s over the upper 60s and low 80s.  These will be scanned into the record.  The patient has chronic low back pain secondary to degenerative disc disease and has severe scoliosis.  She also has ulcerative colitis.  She takes a lot of over-the-counter medicines and vitamins.  She takes Asacol for her ulcerative colitis Tylenol if needed for pain Colace as a stool softener and Estrace 1 mg.  She also takes glucosamine chondroitin.  She is pleasant and doing well.  She sees the gastroenterologist for her colitis on a yearly basis now over the last visit being in January of this year.  She also saw the cardiologist in December and he told her only to come back if needed.  She denies any chest pain or shortness of breath.  She still has occasional episodes or rare episodes when she will feel like her heart is racing and she feels a little bit lightheaded and this is concerning to her.  I told her it would be hard to capture this unless she went to emergency room or somewhere that could do an EKG.  Also told her there are She can put on her phone that may can monitor the heart rate and she should look into that so she has more episodes she can try to figure out what this irregularity is.  Also told her she should check her pulse and check the rate and regularity of her pulse rate and she will try to do this.  She only  has 1 cup of coffee that half and half in the morning and does not drink any other caffeine during the day.  She denies any nausea vomiting diarrhea blood in the stool or black tarry bowel movements and says she is passing her water without problems.  She denies any shortness of breath.  She is up-to-date on her pelvic exams.     Patient Active Problem List   Diagnosis Date Noted  . Osteopenia of the elderly 02/19/2014  . DDD (degenerative disc disease), lumbar 12/03/2013  . Scoliosis 12/03/2013  . Chronic LBP 05/14/2013  . IBS (irritable bowel syndrome) 11/08/2012  . UC (ulcerative colitis) ([place]) 04/23/2012  . Palpitation 10/26/2011  . Hypertension 10/26/2011  . Chronic ulcerative colitis ([place]) 03/23/2011   Outpatient Encounter Medications as of 06/22/2017  Medication Sig  . acetaminophen (TYLENOL 8 HOUR ARTHRITIS PAIN) 650 MG CR tablet Take 650 mg by mouth every 8 (eight) hours as needed. Patient states that she takes prn for arthritis in her knees.   . Bromelains 500 MG TABS Take 500 mg by mouth daily.  . Calcium Carbonate-Vitamin D (CALCIUM 600+D) 600-400 MG-UNIT per tablet Take 1 tablet by mouth every evening.  [name] 500 MG CAPS Take 1 capsule by mouth daily.   . Coenzyme Q10 (CO Q-10 PO) Take 1 tablet by mouth  daily. With red yeast rice [employer] Brand  . COLLAGEN PO Take 1 tablet by mouth daily. This is known as 1-2-3  . Cyanocobalamin (VITAMIN B 12 PO) Take 1,000 mcg by mouth daily.   [name] docusate sodium (COLACE) 100 MG capsule Take 1 capsule (100 mg total) by mouth 2 (two) times daily.  [name] estradiol (ESTRACE) 1 MG tablet Take 1 mg by mouth every other day.   . Ginkgo Biloba 40 MG TABS Take 40 mg by mouth daily.   [name] glucosamine-chondroitin 500-400 MG tablet Take 1 tablet by mouth 2 (two) times daily.    [name] Hyaluronic Acid-Vitamin C (HYALURONIC ACID PO) Take 80 mg by mouth daily.  . Mesalamine (ASACOL HD) 800 MG TBEC Take 2 tablets (1,600 mg total) by mouth daily.  [name] MILK  THISTLE PO Take 100 mg by mouth daily.  . Misc Natural Products (BUTCHERS BROOM PO) Take 1 tablet by mouth daily.   . MULTIPLE VITAMINS PO Take 1 tablet by mouth daily. In the mornings  . Nutritional Supplements (GRAPESEED EXTRACT PO) Take 60 mg by mouth daily.   [name] OAT BRAN SOLUBLE PO Take 1 tablet by mouth daily.  [name] OVER THE COUNTER MEDICATION Take 66 mg by mouth daily. Vision Gold Lutein  . OVER THE COUNTER MEDICATION Take 250 mg by mouth daily. Alphalipoic Acid 250 mg daily  . pantoprazole (PROTONIX) 40 MG tablet Take 1 tablet (40 mg total) by mouth daily before breakfast. (Patient taking differently: Take 40 mg by mouth every other day. )  . POTASSIUM GLUCONATE PO Take 99 mg by mouth daily. This is Chelated Potassium -[employer]  . pyridOXINE (VITAMIN B-6) 100 MG tablet Take 100 mg by mouth daily.   . Turmeric 500 MG CAPS Take 1 capsule by mouth 2 (two) times daily.   . Vitamins C E (CRANBERRY CONCENTRATE PO) Take 2,500 mg by mouth daily.   . Zinc 50 MG CAPS Take 50 mg by mouth daily.    No facility-administered encounter medications on file as of 06/22/2017.      Review of Systems  Constitutional: Negative.   HENT: Negative.   Eyes: Negative.   Respiratory: Negative.   Cardiovascular: Negative.   Gastrointestinal: Negative.   Endocrine: Negative.   Genitourinary: Negative.   Musculoskeletal: Negative.   Skin: Negative.   Allergic/Immunologic: Negative.   Neurological: Negative.   Hematological: Negative.   Psychiatric/Behavioral: Negative.        Objective:   Physical Exam  Constitutional: She is oriented to person, place, and time. She appears well-developed and well-nourished. No distress.  HENT:  Head: Normocephalic and atraumatic.  Right Ear: External ear normal.  Left Ear: External ear normal.  Nose: Nose normal.  Mouth/Throat: Oropharynx is clear and moist. No oropharyngeal exudate.  Eyes: Conjunctivae and EOM are normal. Pupils are equal, round, and reactive  to light. Right eye exhibits no discharge. Left eye exhibits no discharge. No scleral icterus.  Neck: Normal range of motion. Neck supple. No JVD present. No thyromegaly present.  No bruits thyromegaly or anterior cervical adenopathy  Cardiovascular: Normal rate, regular rhythm, normal heart sounds and intact distal pulses.  No murmur heard. Heart is regular today at 72/min  Pulmonary/Chest: Effort normal and breath sounds normal. No respiratory distress. She has no wheezes. She has no rales.  Clear anteriorly and posteriorly  Abdominal: Soft. Bowel sounds are normal. She exhibits no mass. There is no tenderness. There is no rebound and no guarding.  No abdominal tenderness  masses organ enlargement bruits or inguinal adenopathy  Musculoskeletal: Normal range of motion. She exhibits no edema.  Severe scoliosis in the thoracic - lumbar area  Lymphadenopathy:    She has no cervical adenopathy.  Neurological: She is alert and oriented to person, place, and time. She has normal reflexes. No cranial nerve deficit.  Skin: Skin is warm and dry. No rash noted.  Psychiatric: She has a normal mood and affect. Her behavior is normal. Judgment and thought content normal.  Nursing note and vitals reviewed.  BP (!) 142/83 (BP Location: Left Arm)   Pulse 65   Temp 98.5 F (36.9 C) (Oral)   Ht 5' 2"  (1.575 m)   Wt 135 lb (61.2 kg)   BMI 24.69 kg/m         Assessment & Plan:  1. Essential hypertension -The blood pressure is good she will continue with current treatment as well as monitoring blood pressures from home - BMP8+EGFR - CBC with Differential/Platelet - Lipid panel - Hepatic function panel  2. Vitamin D deficiency -Continue current treatment pending results of lab work - CBC with Differential/Platelet - VITAMIN D 25 Hydroxy (Vit-D Deficiency, Fractures)  3. Family history of heart disease -The patient will continue to check blood pressures watch her diet and monitor her pulse  rate and regularity if she has continued episodes of lightheadedness. - CBC with Differential/Platelet - Lipid panel  4. DDD (degenerative disc disease), lumbar -Continue to be careful not put herself at risk for falling and avoid heavy lifting pushing pulling  5. Ulcerative colitis without complications, unspecified location [facility]) -Follow-up with gastroenterologist yearly as planned  6. Scoliosis of thoracic spine, unspecified scoliosis type -Stay active physically keep weight down and do physical therapy at home as currently doing  Patient Instructions                       Medicare Annual Wellness Visit  [facility] and the medical providers at [facility] strive to bring you the best medical care.  In doing so we not only want to address your current medical conditions and concerns but also to detect new conditions early and prevent illness, disease and health-related problems.    Medicare offers a yearly Wellness Visit which allows our clinical staff to assess your need for preventative services including immunizations, lifestyle education, counseling to decrease risk of preventable diseases and screening for fall risk and other medical concerns.    This visit is provided free of charge (no copay) for all Medicare recipients. The clinical pharmacists at [facility] have begun to conduct these Wellness Visits which will also include a thorough review of all your medications.    As you primary medical provider recommend that you make an appointment for your Annual Wellness Visit if you have not done so already this year.  You may set up this appointment before you leave today or you may call back ([phone]) and schedule an appointment.  Please make sure when you call that you mention that you are scheduling your Annual Wellness Visit with the clinical pharmacist so that the appointment may be made for the proper length of time.     Continue  current medications. Continue good therapeutic lifestyle changes which include good diet and exercise. Fall precautions discussed with patient. If an FOBT was given today- please return it to our front desk. If you are over 1 years old - you may need Prevnar 83  or the adult Pneumonia vaccine.  **Flu shots are available--- please call and schedule a FLU-CLINIC appointment**  After your visit with [place] today you will receive a survey in the mail or online from [employer] regarding your care with [place]. Please take a moment to fill this out. Your feedback is very important to [place] as you can help [place] better understand your patient needs as well as improve your experience and satisfaction. WE CARE ABOUT YOU!!!   The patient should consider getting an app for her phone that can monitor her heart irregularity and possibly even do an EKG She should avoid caffeine as much as possible as she is currently doing She should follow-up with the gastroenterologist as he recommends She should stay well-hydrated and drink plenty of fluids Continue to monitor blood pressures at home and bring these readings into each visit  Arrie Senate MD

## 2017-06-22 NOTE — Patient Instructions (Addendum)
Medicare Annual Wellness Visit  [facility] and the medical providers at [facility] strive to bring you the best medical care.  In doing so we not only want to address your current medical conditions and concerns but also to detect new conditions early and prevent illness, disease and health-related problems.    Medicare offers a yearly Wellness Visit which allows our clinical staff to assess your need for preventative services including immunizations, lifestyle education, counseling to decrease risk of preventable diseases and screening for fall risk and other medical concerns.    This visit is provided free of charge (no copay) for all Medicare recipients. The clinical pharmacists at [facility] have begun to conduct these Wellness Visits which will also include a thorough review of all your medications.    As you primary medical provider recommend that you make an appointment for your Annual Wellness Visit if you have not done so already this year.  You may set up this appointment before you leave today or you may call back ([phone]) and schedule an appointment.  Please make sure when you call that you mention that you are scheduling your Annual Wellness Visit with the clinical pharmacist so that the appointment may be made for the proper length of time.     Continue current medications. Continue good therapeutic lifestyle changes which include good diet and exercise. Fall precautions discussed with patient. If an FOBT was given today- please return it to our front desk. If you are over 41 years old - you may need Prevnar 39 or the adult Pneumonia vaccine.  **Flu shots are available--- please call and schedule a FLU-CLINIC appointment**  After your visit with [place] today you will receive a survey in the mail or online from [employer] regarding your care with [place]. Please take a moment to fill this out. Your feedback is very  important to [place] as you can help [place] better understand your patient needs as well as improve your experience and satisfaction. WE CARE ABOUT YOU!!!   The patient should consider getting an app for her phone that can monitor her heart irregularity and possibly even do an EKG She should avoid caffeine as much as possible as she is currently doing She should follow-up with the gastroenterologist as he recommends She should stay well-hydrated and drink plenty of fluids Continue to monitor blood pressures at home and bring these readings into each visit

## 2017-06-23 LAB — BMP8+EGFR
BUN/Creatinine Ratio: 9 — ABNORMAL LOW (ref 12–28)
BUN: 7 mg/dL — AB (ref 8–27)
CO2: 28 mmol/L (ref 20–29)
CREATININE: 0.78 mg/dL (ref 0.57–1.00)
Calcium: 9.9 mg/dL (ref 8.7–10.3)
Chloride: 101 mmol/L (ref 96–106)
GFR, EST AFRICAN AMERICAN: 84 mL/min/{1.73_m2} (ref >59)
GFR, EST NON AFRICAN AMERICAN: 73 mL/min/{1.73_m2} (ref >59)
Glucose: 92 mg/dL (ref 65–99)
Potassium: 4.7 mmol/L (ref 3.5–5.2)
Sodium: 142 mmol/L (ref 134–144)

## 2017-06-23 LAB — CBC WITH DIFFERENTIAL/PLATELET
BASOS: 1 %
Basophils Absolute: 0 10*3/uL (ref 0.0–0.2)
EOS (ABSOLUTE): 0.1 10*3/uL (ref 0.0–0.4)
Eos: 1 %
HEMOGLOBIN: 13.4 g/dL (ref 11.1–15.9)
Hematocrit: 40.5 % (ref 34.0–46.6)
IMMATURE GRANS (ABS): 0 10*3/uL (ref 0.0–0.1)
Immature Granulocytes: 0 %
LYMPHS: 31 %
Lymphocytes Absolute: 1.7 10*3/uL (ref 0.7–3.1)
MCH: 29.2 pg (ref 26.6–33.0)
MCHC: 33.1 g/dL (ref 31.5–35.7)
MCV: 88 fL (ref 79–97)
MONOCYTES: 8 %
Monocytes Absolute: 0.4 10*3/uL (ref 0.1–0.9)
NEUTROS PCT: 59 %
Neutrophils Absolute: 3.4 10*3/uL (ref 1.4–7.0)
PLATELETS: 270 10*3/uL (ref 150–379)
RBC: 4.59 x10E6/uL (ref 3.77–5.28)
RDW: 14 % (ref 12.3–15.4)
WBC: 5.7 10*3/uL (ref 3.4–10.8)

## 2017-06-23 LAB — VITAMIN D 25 HYDROXY (VIT D DEFICIENCY, FRACTURES): VIT D 25 HYDROXY: 44.3 ng/mL (ref 30.0–100.0)

## 2017-06-23 LAB — HEPATIC FUNCTION PANEL
ALT: 16 IU/L (ref 0–32)
AST: 23 IU/L (ref 0–40)
Albumin: 4.5 g/dL (ref 3.5–4.8)
Alkaline Phosphatase: 40 IU/L (ref 39–117)
BILIRUBIN, DIRECT: 0.16 mg/dL (ref 0.00–0.40)
Bilirubin Total: 0.5 mg/dL (ref 0.0–1.2)
Total Protein: 7.4 g/dL (ref 6.0–8.5)

## 2017-06-23 LAB — LIPID PANEL
Chol/HDL Ratio: 2.6 ratio (ref 0.0–4.4)
Cholesterol, Total: 194 mg/dL (ref 100–199)
HDL: 75 mg/dL (ref >39)
LDL CALC: 102 mg/dL — AB (ref 0–99)
Triglycerides: 83 mg/dL (ref 0–149)
VLDL CHOLESTEROL CAL: 17 mg/dL (ref 5–40)

## 2017-06-26 ENCOUNTER — Ambulatory Visit: Payer: Medicare Other | Admitting: Family Medicine

## 2017-08-07 ENCOUNTER — Other Ambulatory Visit (INDEPENDENT_AMBULATORY_CARE_PROVIDER_SITE_OTHER): Payer: Self-pay | Admitting: *Deleted

## 2017-08-07 MED ORDER — PANTOPRAZOLE SODIUM 40 MG PO TBEC: 40.0000 mg | DELAYED_RELEASE_TABLET | Freq: Every day | ORAL | 3 refills | Status: DC

## 2017-08-16 DIAGNOSIS — G5601 Carpal tunnel syndrome, right upper limb: Secondary | ICD-10-CM | POA: Diagnosis not present

## 2017-09-20 DIAGNOSIS — G5601 Carpal tunnel syndrome, right upper limb: Secondary | ICD-10-CM | POA: Diagnosis not present

## 2017-09-20 DIAGNOSIS — M542 Cervicalgia: Secondary | ICD-10-CM | POA: Diagnosis not present

## 2017-09-22 ENCOUNTER — Other Ambulatory Visit: Payer: Self-pay | Admitting: Orthopedic Surgery

## 2017-10-25 ENCOUNTER — Ambulatory Visit: Payer: Medicare Other | Admitting: Family Medicine

## 2017-10-25 ENCOUNTER — Other Ambulatory Visit: Payer: Self-pay

## 2017-10-25 ENCOUNTER — Encounter (HOSPITAL_BASED_OUTPATIENT_CLINIC_OR_DEPARTMENT_OTHER): Payer: Self-pay | Admitting: *Deleted

## 2017-10-30 ENCOUNTER — Encounter: Payer: Self-pay | Admitting: Family Medicine

## 2017-10-30 ENCOUNTER — Ambulatory Visit (INDEPENDENT_AMBULATORY_CARE_PROVIDER_SITE_OTHER): Payer: Medicare Other | Admitting: Family Medicine

## 2017-10-30 VITALS — BP 133/77 | HR 64 | Temp 97.5°F | Ht 62.0 in | Wt 136.0 lb

## 2017-10-30 DIAGNOSIS — E559 Vitamin D deficiency, unspecified: Secondary | ICD-10-CM | POA: Diagnosis not present

## 2017-10-30 DIAGNOSIS — M5136 Other intervertebral disc degeneration, lumbar region: Secondary | ICD-10-CM | POA: Diagnosis not present

## 2017-10-30 DIAGNOSIS — I10 Essential (primary) hypertension: Secondary | ICD-10-CM

## 2017-10-30 DIAGNOSIS — K5190 Ulcerative colitis, unspecified, without complications: Secondary | ICD-10-CM

## 2017-10-30 DIAGNOSIS — M419 Scoliosis, unspecified: Secondary | ICD-10-CM

## 2017-10-30 DIAGNOSIS — Z8249 Family history of ischemic heart disease and other diseases of the circulatory system: Secondary | ICD-10-CM

## 2017-10-30 DIAGNOSIS — M51369 Other intervertebral disc degeneration, lumbar region without mention of lumbar back pain or lower extremity pain: Secondary | ICD-10-CM

## 2017-10-30 NOTE — Patient Instructions (Addendum)
Medicare Annual Wellness Visit  [place] and the medical providers at [facility] strive to bring you the best medical care.  In doing so we not only want to address your current medical conditions and concerns but also to detect new conditions early and prevent illness, disease and health-related problems.    Medicare offers a yearly Wellness Visit which allows our clinical staff to assess your need for preventative services including immunizations, lifestyle education, counseling to decrease risk of preventable diseases and screening for fall risk and other medical concerns.    This visit is provided free of charge (no copay) for all Medicare recipients. The clinical pharmacists at [facility] have begun to conduct these Wellness Visits which will also include a thorough review of all your medications.    As you primary medical provider recommend that you make an appointment for your Annual Wellness Visit if you have not done so already this year.  You may set up this appointment before you leave today or you may call back ([phone]) and schedule an appointment.  Please make sure when you call that you mention that you are scheduling your Annual Wellness Visit with the clinical pharmacist so that the appointment may be made for the proper length of time.     Continue current medications. Continue good therapeutic lifestyle changes which include good diet and exercise. Fall precautions discussed with patient. If an FOBT was given today- please return it to our front desk. If you are over 46 years old - you may need Prevnar 27 or the adult Pneumonia vaccine.  **Flu shots are available--- please call and schedule a FLU-CLINIC appointment**  After your visit with [place] today you will receive a survey in the mail or online from [employer] regarding your care with [place]. Please take a moment to fill this out. Your feedback is very  important to [place] as you can help [place] better understand your patient needs as well as improve your experience and satisfaction. WE CARE ABOUT YOU!!!   You need to follow-up with gastroenterology on a yearly basis Follow-up with cardiology as needed Follow through with orthopedic surgery tomorrow Call us when you are released and we will arrange next-door to have some traction on your back to see if it will help control some of the pain secondary to the severe scoliosis This summer drink plenty of water Return to the office for fasting lab work

## 2017-10-30 NOTE — Progress Notes (Signed)
Subjective:    Patient ID: Lori Soto, female    DOB: 1936/03/07, 82 y.o.   MRN: [id number]  HPI Pt here for follow up and management of chronic medical problems which includes hypertension. She is taking medication regularly.  The patient is scheduled for right carpal tunnel surgery tomorrow with Dr. Daryll Brod.  She does not need any refills.  Her vital signs are stable.  Well overall.  She denies any chest pain pressure tightness or shortness of breath.  She saw the cardiologist in December 18 and he told her that she did not need to come back in when she had any problem so he was pleased with with her at that visit.  She denies any trouble with her stomach currently other than her ongoing ulcerative colitis and the family history of colon cancer and she sees the gastroenterologist once yearly.  She is passing her water without problems.    Patient Active Problem List   Diagnosis Date Noted  . Osteopenia of the elderly 02/19/2014  . DDD (degenerative disc disease), lumbar 12/03/2013  . Scoliosis 12/03/2013  . Chronic LBP 05/14/2013  . IBS (irritable bowel syndrome) 11/08/2012  . UC (ulcerative colitis) ([place]) 04/23/2012  . Palpitation 10/26/2011  . Hypertension 10/26/2011  . Chronic ulcerative colitis ([place]) 03/23/2011   Outpatient Encounter Medications as of 10/30/2017  Medication Sig  . acetaminophen (TYLENOL 8 HOUR ARTHRITIS PAIN) 650 MG CR tablet Take 650 mg by mouth every 8 (eight) hours as needed. Patient states that she takes prn for arthritis in her knees.   . Bromelains 500 MG TABS Take 500 mg by mouth daily.  . Calcium Carbonate-Vitamin D (CALCIUM 600+D) 600-400 MG-UNIT per tablet Take 1 tablet by mouth every evening.  [name] 500 MG CAPS Take 1 capsule by mouth daily.   . Coenzyme Q10 (CO Q-10 PO) Take 1 tablet by mouth daily. With red yeast rice [employer] Brand  . COLLAGEN PO Take 1 tablet by mouth daily. This is known as 1-2-3  . Cyanocobalamin (VITAMIN B 12  PO) Take 1,000 mcg by mouth daily.   [name] estradiol (ESTRACE) 1 MG tablet Take 1 mg by mouth every other day.   . Ginkgo Biloba 40 MG TABS Take 40 mg by mouth daily.   [name] glucosamine-chondroitin 500-400 MG tablet Take 1 tablet by mouth 2 (two) times daily.    [name] Hyaluronic Acid-Vitamin C (HYALURONIC ACID PO) Take 80 mg by mouth daily.  . Mesalamine (ASACOL HD) 800 MG TBEC Take 2 tablets (1,600 mg total) by mouth daily.  [name] MILK THISTLE PO Take 100 mg by mouth daily.  . MULTIPLE VITAMINS PO Take 1 tablet by mouth daily. In the mornings  . Nutritional Supplements (GRAPESEED EXTRACT PO) Take 60 mg by mouth daily.   [name] OAT BRAN SOLUBLE PO Take 1 tablet by mouth daily.  [name] OVER THE COUNTER MEDICATION Take 66 mg by mouth daily. Vision Gold Lutein  . OVER THE COUNTER MEDICATION Take 250 mg by mouth daily. Alphalipoic Acid 250 mg daily  . pantoprazole (PROTONIX) 40 MG tablet Take 1 tablet (40 mg total) by mouth daily before breakfast.  . POTASSIUM GLUCONATE PO Take 99 mg by mouth daily. This is Chelated Potassium -[employer]  . pyridOXINE (VITAMIN B-6) 100 MG tablet Take 100 mg by mouth daily.   . Turmeric 500 MG CAPS Take 1 capsule by mouth 2 (two) times daily.   . Vitamins C E (CRANBERRY CONCENTRATE PO) Take 2,500  mg by mouth daily.   . Zinc 50 MG CAPS Take 50 mg by mouth daily.    No facility-administered encounter medications on file as of 10/30/2017.      Review of Systems  Constitutional: Negative.   HENT: Negative.   Eyes: Negative.   Respiratory: Negative.   Cardiovascular: Negative.   Gastrointestinal: Negative.   Endocrine: Negative.   Genitourinary: Negative.   Musculoskeletal: Positive for arthralgias (right hand pain - having surgery tomorrow).  Skin: Negative.   Allergic/Immunologic: Negative.   Neurological: Negative.   Hematological: Negative.   Psychiatric/Behavioral: Negative.        Objective:   Physical Exam  Constitutional: She is oriented to person, place, and time.  She appears well-developed and well-nourished. No distress.  The patient is pleasant and relaxed and looks great for her age despite her severe scoliosis and pain that she has to do with with that.  HENT:  Head: Normocephalic and atraumatic.  Right Ear: External ear normal.  Left Ear: External ear normal.  Nose: Nose normal.  Mouth/Throat: Oropharynx is clear and moist. No oropharyngeal exudate.  Eyes: Pupils are equal, round, and reactive to light. Conjunctivae and EOM are normal. Right eye exhibits no discharge. Left eye exhibits no discharge. No scleral icterus.  Neck: Normal range of motion. Neck supple. No thyromegaly present.  Cardiovascular: Normal rate, regular rhythm, normal heart sounds and intact distal pulses.  No murmur heard. The heart is regular at 72/min  Pulmonary/Chest: Effort normal and breath sounds normal. She has no wheezes. She has no rales.  Lungs are clear anteriorly and posteriorly  Abdominal: Soft. Bowel sounds are normal. She exhibits no mass. There is no tenderness.  The abdomen is soft without masses tenderness organ enlargement or bruit  Musculoskeletal: She exhibits deformity. She exhibits no edema or tenderness.  Patient has severe thoracic go lumbar scoliosis.  She moves slowly so as not to fall.  Lymphadenopathy:    She has no cervical adenopathy.  Neurological: She is alert and oriented to person, place, and time. She has normal reflexes. No cranial nerve deficit.  Skin: Skin is warm and dry. No rash noted.  Psychiatric: She has a normal mood and affect. Her behavior is normal. Judgment and thought content normal.  The patient has normal mood affect and behavior.  Nursing note and vitals reviewed.  BP 133/77 (BP Location: Left Arm)   Pulse 64   Temp (!) 97.5 F (36.4 C) (Oral)   Ht 5' 2"  (1.575 m)   Wt 136 lb (61.7 kg)   BMI 24.87 kg/m         Assessment & Plan:  1. Essential hypertension -Blood pressure is good today and she will continue  with current treatment - BMP8+EGFR; Future - Lipid panel; Future - Hepatic function panel; Future - CBC with Differential/Platelet; Future  2. Vitamin D deficiency -Continue with vitamin D replacement pending results of lab work - VITAMIN D 25 Hydroxy (Vit-D Deficiency, Fractures); Future - CBC with Differential/Platelet; Future  3. Family history of heart disease -Follow-up with cardiology every couple of years - Lipid panel; Future - CBC with Differential/Platelet; Future  4. DDD (degenerative disc disease), lumbar -Consider traction when patient heals from the upcoming carpal tunnel surgery tomorrow.  5. Scoliosis of thoracic spine, unspecified scoliosis type -Continue to be careful not put herself at risk for falling and consider traction when recovered from carpal tunnel surgery  6. Ulcerative colitis without complications, unspecified location [facility]) -Follow-up with gastroenterology yearly -  return the fobt  Patient Instructions                       Medicare Annual Wellness Visit  [place] and the medical providers at [facility] strive to bring you the best medical care.  In doing so we not only want to address your current medical conditions and concerns but also to detect new conditions early and prevent illness, disease and health-related problems.    Medicare offers a yearly Wellness Visit which allows our clinical staff to assess your need for preventative services including immunizations, lifestyle education, counseling to decrease risk of preventable diseases and screening for fall risk and other medical concerns.    This visit is provided free of charge (no copay) for all Medicare recipients. The clinical pharmacists at [facility] have begun to conduct these Wellness Visits which will also include a thorough review of all your medications.    As you primary medical provider recommend that you make an appointment for  your Annual Wellness Visit if you have not done so already this year.  You may set up this appointment before you leave today or you may call back ([phone]) and schedule an appointment.  Please make sure when you call that you mention that you are scheduling your Annual Wellness Visit with the clinical pharmacist so that the appointment may be made for the proper length of time.     Continue current medications. Continue good therapeutic lifestyle changes which include good diet and exercise. Fall precautions discussed with patient. If an FOBT was given today- please return it to our front desk. If you are over 73 years old - you may need Prevnar 17 or the adult Pneumonia vaccine.  **Flu shots are available--- please call and schedule a FLU-CLINIC appointment**  After your visit with [place] today you will receive a survey in the mail or online from [employer] regarding your care with [place]. Please take a moment to fill this out. Your feedback is very important to [place] as you can help [place] better understand your patient needs as well as improve your experience and satisfaction. WE CARE ABOUT YOU!!!   You need to follow-up with gastroenterology on a yearly basis Follow-up with cardiology as needed Follow through with orthopedic surgery tomorrow Call us when you are released and we will arrange next-door to have some traction on your back to see if it will help control some of the pain secondary to the severe scoliosis This summer drink plenty of water Return to the office for fasting lab work    Arrie Senate MD

## 2017-10-31 ENCOUNTER — Other Ambulatory Visit: Payer: Self-pay

## 2017-10-31 ENCOUNTER — Encounter (HOSPITAL_BASED_OUTPATIENT_CLINIC_OR_DEPARTMENT_OTHER): Payer: Self-pay

## 2017-10-31 ENCOUNTER — Ambulatory Visit (HOSPITAL_BASED_OUTPATIENT_CLINIC_OR_DEPARTMENT_OTHER): Payer: Medicare Other | Admitting: Anesthesiology

## 2017-10-31 ENCOUNTER — Ambulatory Visit (HOSPITAL_BASED_OUTPATIENT_CLINIC_OR_DEPARTMENT_OTHER)
Admission: RE | Admit: 2017-10-31 | Discharge: 2017-10-31 | Disposition: A | Discharge status: Home / Self Care | Payer: Medicare Other | Source: Ambulatory Visit | Attending: Orthopedic Surgery | Admitting: Orthopedic Surgery

## 2017-10-31 ENCOUNTER — Encounter (HOSPITAL_BASED_OUTPATIENT_CLINIC_OR_DEPARTMENT_OTHER)
Admission: RE | Disposition: A | Discharge status: Home / Self Care | Payer: Self-pay | Source: Ambulatory Visit | Attending: Orthopedic Surgery

## 2017-10-31 DIAGNOSIS — I10 Essential (primary) hypertension: Secondary | ICD-10-CM | POA: Diagnosis not present

## 2017-10-31 DIAGNOSIS — Z79899 Other long term (current) drug therapy: Secondary | ICD-10-CM | POA: Diagnosis not present

## 2017-10-31 DIAGNOSIS — K219 Gastro-esophageal reflux disease without esophagitis: Secondary | ICD-10-CM | POA: Diagnosis not present

## 2017-10-31 DIAGNOSIS — G5601 Carpal tunnel syndrome, right upper limb: Secondary | ICD-10-CM | POA: Insufficient documentation

## 2017-10-31 DIAGNOSIS — Z8711 Personal history of peptic ulcer disease: Secondary | ICD-10-CM | POA: Diagnosis not present

## 2017-10-31 HISTORY — PX: CARPAL TUNNEL RELEASE: SHX101

## 2017-10-31 SURGERY — CARPAL TUNNEL RELEASE
Anesthesia: Regional | Site: Wrist | Laterality: Right

## 2017-10-31 MED ORDER — VANCOMYCIN HCL IN DEXTROSE 1-5 GM/200ML-% IV SOLN
1000.0000 mg | INTRAVENOUS | Status: AC
  Administered 2017-10-31: 1000 mg via INTRAVENOUS

## 2017-10-31 MED ORDER — TRAMADOL HCL 50 MG PO TABS: 50.0000 mg | ORAL_TABLET | Freq: Four times a day (QID) | ORAL | 0 refills | Status: DC | PRN

## 2017-10-31 MED ORDER — FENTANYL CITRATE (PF) 100 MCG/2ML IJ SOLN
INTRAMUSCULAR | Status: AC
  Filled 2017-10-31: qty 2

## 2017-10-31 MED ORDER — SCOPOLAMINE 1 MG/3DAYS TD PT72: 1.0000 | MEDICATED_PATCH | Freq: Once | TRANSDERMAL | Status: DC | PRN

## 2017-10-31 MED ORDER — MIDAZOLAM HCL 2 MG/2ML IJ SOLN: 1.0000 mg | INTRAMUSCULAR | Status: DC | PRN

## 2017-10-31 MED ORDER — FENTANYL CITRATE (PF) 100 MCG/2ML IJ SOLN
25.0000 ug | INTRAMUSCULAR | Status: DC | PRN
  Administered 2017-10-31: 25 ug via INTRAVENOUS

## 2017-10-31 MED ORDER — MIDAZOLAM HCL 2 MG/2ML IJ SOLN
INTRAMUSCULAR | Status: AC
Start: 2017-10-31 — End: ?
  Filled 2017-10-31: qty 2

## 2017-10-31 MED ORDER — ONDANSETRON HCL 4 MG/2ML IJ SOLN
INTRAMUSCULAR | Status: DC | PRN
  Administered 2017-10-31: 4 mg via INTRAVENOUS

## 2017-10-31 MED ORDER — SUCCINYLCHOLINE CHLORIDE 200 MG/10ML IV SOSY
PREFILLED_SYRINGE | INTRAVENOUS | Status: AC
  Filled 2017-10-31: qty 10

## 2017-10-31 MED ORDER — BUPIVACAINE HCL (PF) 0.25 % IJ SOLN
INTRAMUSCULAR | Status: DC | PRN
  Administered 2017-10-31: 8 mL

## 2017-10-31 MED ORDER — FENTANYL CITRATE (PF) 100 MCG/2ML IJ SOLN
50.0000 ug | INTRAMUSCULAR | Status: DC | PRN
  Administered 2017-10-31 (×2): 25 ug via INTRAVENOUS

## 2017-10-31 MED ORDER — LIDOCAINE HCL (CARDIAC) PF 100 MG/5ML IV SOSY
PREFILLED_SYRINGE | INTRAVENOUS | Status: AC
  Filled 2017-10-31: qty 5

## 2017-10-31 MED ORDER — CHLORHEXIDINE GLUCONATE 4 % EX LIQD: 60.0000 mL | Freq: Once | CUTANEOUS | Status: DC

## 2017-10-31 MED ORDER — DEXAMETHASONE SODIUM PHOSPHATE 10 MG/ML IJ SOLN
INTRAMUSCULAR | Status: AC
  Filled 2017-10-31: qty 1

## 2017-10-31 MED ORDER — ONDANSETRON HCL 4 MG/2ML IJ SOLN
INTRAMUSCULAR | Status: AC
  Filled 2017-10-31: qty 2

## 2017-10-31 MED ORDER — PHENYLEPHRINE 40 MCG/ML (10ML) SYRINGE FOR IV PUSH (FOR BLOOD PRESSURE SUPPORT)
PREFILLED_SYRINGE | INTRAVENOUS | Status: AC
  Filled 2017-10-31: qty 10

## 2017-10-31 MED ORDER — LACTATED RINGERS IV SOLN
INTRAVENOUS | Status: DC
  Administered 2017-10-31: 09:00:00 via INTRAVENOUS

## 2017-10-31 MED ORDER — PROPOFOL 10 MG/ML IV BOLUS
INTRAVENOUS | Status: DC | PRN
  Administered 2017-10-31: 20 mg via INTRAVENOUS

## 2017-10-31 MED ORDER — SODIUM CHLORIDE 0.9 % IJ SOLN
INTRAMUSCULAR | Status: AC
Start: 2017-10-31 — End: ?
  Filled 2017-10-31: qty 10

## 2017-10-31 MED ORDER — VANCOMYCIN HCL IN DEXTROSE 1-5 GM/200ML-% IV SOLN
INTRAVENOUS | Status: AC
  Filled 2017-10-31: qty 200

## 2017-10-31 SURGICAL SUPPLY — 36 items
BLADE SURG 15 STRL LF DISP TIS (BLADE) ×1 IMPLANT
BLADE SURG 15 STRL SS (BLADE) ×3
BNDG CMPR 9X4 STRL LF SNTH (GAUZE/BANDAGES/DRESSINGS)
BNDG COHESIVE 3X5 TAN STRL LF (GAUZE/BANDAGES/DRESSINGS) ×3 IMPLANT
BNDG ESMARK 4X9 LF (GAUZE/BANDAGES/DRESSINGS) IMPLANT
BNDG GAUZE ELAST 4 BULKY (GAUZE/BANDAGES/DRESSINGS) ×3 IMPLANT
CHLORAPREP W/TINT 26ML (MISCELLANEOUS) ×3 IMPLANT
CORD BIPOLAR FORCEPS 12FT (ELECTRODE) ×3 IMPLANT
COVER BACK TABLE 60X90IN (DRAPES) ×3 IMPLANT
COVER MAYO STAND STRL (DRAPES) ×3 IMPLANT
CUFF TOURNIQUET SINGLE 18IN (TOURNIQUET CUFF) ×3 IMPLANT
DRAPE EXTREMITY T 121X128X90 (DRAPE) ×3 IMPLANT
DRAPE SURG 17X23 STRL (DRAPES) ×3 IMPLANT
DRSG PAD ABDOMINAL 8X10 ST (GAUZE/BANDAGES/DRESSINGS) ×3 IMPLANT
GAUZE SPONGE 4X4 12PLY STRL (GAUZE/BANDAGES/DRESSINGS) ×3 IMPLANT
GAUZE XEROFORM 1X8 LF (GAUZE/BANDAGES/DRESSINGS) ×3 IMPLANT
GLOVE BIOGEL PI IND STRL 7.0 (GLOVE) IMPLANT
GLOVE BIOGEL PI IND STRL 8.5 (GLOVE) ×1 IMPLANT
GLOVE BIOGEL PI INDICATOR 7.0 (GLOVE) ×4
GLOVE BIOGEL PI INDICATOR 8.5 (GLOVE) ×2
GLOVE ECLIPSE 6.5 STRL STRAW (GLOVE) ×2 IMPLANT
GLOVE SURG ORTHO 8.0 STRL STRW (GLOVE) ×3 IMPLANT
GOWN STRL REUS W/ TWL LRG LVL3 (GOWN DISPOSABLE) ×1 IMPLANT
GOWN STRL REUS W/TWL LRG LVL3 (GOWN DISPOSABLE) ×3
GOWN STRL REUS W/TWL XL LVL3 (GOWN DISPOSABLE) ×3 IMPLANT
NDL PRECISIONGLIDE 27X1.5 (NEEDLE) IMPLANT
NEEDLE PRECISIONGLIDE 27X1.5 (NEEDLE) ×3 IMPLANT
NS IRRIG 1000ML POUR BTL (IV SOLUTION) ×3 IMPLANT
PACK BASIN DAY SURGERY FS (CUSTOM PROCEDURE TRAY) ×3 IMPLANT
STOCKINETTE 4X48 STRL (DRAPES) ×3 IMPLANT
SUT ETHILON 4 0 PS 2 18 (SUTURE) ×3 IMPLANT
SUT VICRYL 4-0 PS2 18IN ABS (SUTURE) IMPLANT
SYR BULB 3OZ (MISCELLANEOUS) ×3 IMPLANT
SYR CONTROL 10ML LL (SYRINGE) ×2 IMPLANT
TOWEL GREEN STERILE FF (TOWEL DISPOSABLE) ×3 IMPLANT
UNDERPAD 30X30 (UNDERPADS AND DIAPERS) ×3 IMPLANT

## 2017-10-31 NOTE — H&P (Signed)
Lori Soto is an 82 y.o. female.   Chief Complaint:nubness right HPI: Lori Soto is a 82 yo female complaining of a burning pain in her right hand  up at night with numbness and tingling to the median nerve distribution. States that it is getting progressively worse. The pain is moderate in nature. Is primarily in the morning. She states running water under it helps. She does have a history of arthritis.She has a had had a carpal tunnel release on her left side in the past. She was referred to Dr. Thereasa Parkin for nerve conductions.Her nerve conductions are reviewed with her. She shows no response in the median nerve sensory component. Her motor delay is 7.2.     She has no history of thyroid problems diabetes or gout. Family history is positive for diabetes high blood pressure and arthritis.      Past Medical History:  Diagnosis Date  . Anemia   . Arthritis   . Cancer ([place])    skin cancer on nose  . Carpal tunnel syndrome, bilateral   . Cataract   . Esophagitis   . GERD (gastroesophageal reflux disease)   . H/O hiatal hernia   . Osteopenia   . Scoliosis   . Shingles   . Ulcerative colitis     Past Surgical History:  Procedure Laterality Date  . ABDOMINAL HYSTERECTOMY    . BUNIONECTOMY     Bilateral  . CARPAL TUNNEL RELEASE Left 03/10/2015   Procedure: LEFT CARPAL TUNNEL RELEASE;  Surgeon: Daryll Brod, MD;  Location: [facility];  Service: Orthopedics;  Laterality: Left;  . CATARACT EXTRACTION W/PHACO  05/28/2012   Procedure: CATARACT EXTRACTION PHACO AND INTRAOCULAR LENS PLACEMENT (IOC);  Surgeon: Tonny Branch, MD;  Location: [facility];  Service: Ophthalmology;  Laterality: Right;  CDE=12.84  . CATARACT EXTRACTION W/PHACO  06/07/2012   Procedure: CATARACT EXTRACTION PHACO AND INTRAOCULAR LENS PLACEMENT (IOC);  Surgeon: Tonny Branch, MD;  Location: [facility];  Service: Ophthalmology;  Laterality: Left;  CDE 17.60  . COLONOSCOPY    . COLONOSCOPY N/A 07/08/2015   Procedure:  COLONOSCOPY;  Surgeon: Rogene Houston, MD;  [facility];  Service: Endoscopy;  Laterality: N/A;  930  . ESOPHAGEAL DILATION N/A 07/08/2015   Procedure: ESOPHAGEAL DILATION;  Surgeon: Rogene Houston, MD;  [facility];  Service: Endoscopy;  Laterality: N/A;  . ESOPHAGOGASTRODUODENOSCOPY N/A 07/08/2015   Procedure: ESOPHAGOGASTRODUODENOSCOPY (EGD);  Surgeon: Rogene Houston, MD;  [facility];  Service: Endoscopy;  Laterality: N/A;  . Hernia     Right inguinal  . HERNIA REPAIR  1961   right inguinal hernia  . UPPER GASTROINTESTINAL ENDOSCOPY      Family History  Problem Relation Age of Onset  . Arthritis Mother   . Cancer Father        pancreat  . Pancreatic cancer Father   . Cancer Brother        lung and liver  . Liver cancer Brother   . Lung cancer Brother   . Heart disease Sister        Rheumatic Fever  . Peptic Ulcer Sister   . Diabetes Sister   . Colon cancer Sister   . Edema Sister   . Congestive Heart Failure Sister   . GI problems Sister        diverticulitis  . Diabetes Sister   . Neuropathy Sister   . COPD Sister    Social History:  reports that she  has never smoked. She has never used smokeless tobacco. She reports that she drinks alcohol. She reports that she does not use drugs.  Allergies:  Allergies  Allergen Reactions  . Penicillins Itching and Swelling    Patient states that she had a rash also Has patient had a PCN reaction causing immediate rash, facial/tongue/throat swelling, SOB or lightheadedness with hypotension: No Has patient had a PCN reaction causing severe rash involving mucus membranes or skin necrosis: No Has patient had a PCN reaction that required hospitalization No Has patient had a PCN reaction occurring within the last 10 years: No If all of the above answers are "NO", then may proceed with Cephalosporin use.   . Influenza Virus Vacc Split Pf Rash    Per Patient she was told by her PCP not to ever take  this injection again    No medications prior to admission.    No results found for this or any previous visit (from the past 48 hour(s)).  No results found.   Pertinent items are noted in HPI.  Height 5' 2"  (1.575 m), weight 60.8 kg (134 lb).  General appearance: alert, cooperative and appears stated age Head: Normocephalic, without obvious abnormality Neck: no JVD Resp: clear to auscultation bilaterally Cardio: regular rate and rhythm, S1, S2 normal, no murmur, click, rub or gallop GI: soft, non-tender; bowel sounds normal; no masses,  no organomegaly Extremities: numbness right hand Pulses: 2+ and symmetric Skin: Skin color, texture, turgor normal. No rashes or lesions Neurologic: Grossly normal Incision/Wound: na  Assessment/Plan Diagnosis: carpal tunnel syndrome right hand Plan: We have discussed with her the possible release of the carpal canal on her right side.. Peri-and postoperative course are discussed along with risks and complications. She is aware that there is no guarantee to the surgery the possibility of infection recurrence injury to arteries nerves tendons complete relief symptoms and dystrophy. He would like to proceed. She is scheduled for right carpal tunnel release in outpatient under regional anesthesia.         [name] R 10/31/2017, 4:21 AM

## 2017-10-31 NOTE — Anesthesia Preprocedure Evaluation (Signed)
Anesthesia Evaluation  Patient identified by MRN, date of birth, ID band Patient awake    Reviewed: Allergy & Precautions, NPO status , Patient's Chart, lab work & pertinent test results  Airway Mallampati: II  TM Distance: >3 FB Neck ROM: Full    Dental  (+) Partial Upper   Pulmonary neg pulmonary ROS,    Pulmonary exam normal        Cardiovascular hypertension,  Rhythm:Regular Rate:Normal     Neuro/Psych  Neuromuscular disease    GI/Hepatic Neg liver ROS, hiatal hernia, PUD, GERD  Medicated,  Endo/Other  negative endocrine ROS  Renal/GU negative Renal ROS     Musculoskeletal  (+) Arthritis , Osteoarthritis,    Abdominal Normal abdominal exam  (+)   Peds  Hematology  (+) anemia ,   Anesthesia Other Findings   Reproductive/Obstetrics                             Anesthesia Physical Anesthesia Plan  ASA: II  Anesthesia Plan: Bier Block and Bier Block-LIDOCAINE ONLY   Post-op Pain Management:    Induction: Intravenous  PONV Risk Score and Plan: 3 and Ondansetron  Airway Management Planned: Simple Face Mask  Additional Equipment: None  Intra-op Plan:   Post-operative Plan:   Informed Consent: I have reviewed the patients History and Physical, chart, labs and discussed the procedure including the risks, benefits and alternatives for the proposed anesthesia with the patient or authorized representative who has indicated his/her understanding and acceptance.   Dental advisory given  Plan Discussed with: CRNA  Anesthesia Plan Comments:         Anesthesia Quick Evaluation

## 2017-10-31 NOTE — Op Note (Signed)
Preoperative diagnosis: Carpal tunnel syndrome right hand  Postoperative diagnosis: Same  Operation: Carpal tunnel release right hand  Surgeon: Daryll Brod  Assistant: None  Anesthesia: Forearm IV regional with local infiltration IV sedation  Place of surgery: [facility]  History: The patient is a 82 year old female with a history of bilateral carpal tunnel syndrome she has undergone release of her left side in the past and is admitted now for release of her right pre-peri-and postoperative course were discussed along with risks and complications.  She is aware there is no guarantee to the surgery the possibility of infection recurrence injury to arteries nerves tendons complete relief symptoms dystrophy.  Nerve conductions are positive.  Procedure: Patient is brought to the operating room where a forearm-based IV regional anesthetic was carried out without difficulty under the direction the anesthesia department.  She was prepped using ChloraPrep in the supine position with the right arm free.  A three-minute dry time was allowed timeout taken to confirm patient procedure.  A longitudinal incision was made the right palm carried down through subcutaneous tissue.  Bleeders were electrocauterized with bipolar.  The palmar fascia was split.  The superficial palmar arch was identified.  Flexor tendon of the ring little finger was identified.  Retractors were placed retracted median nerve radially the ulnar nerve ulnarly.  The flexor retinaculum and then released on its ulnar border.  A right angled saw retractor was placed between skin and forearm fascia.  Deep structures dissected free with blunt dissection.  Blunt scissor was then used to dissect the proximal aspect of the flexor retinaculum distal forearm fascia for approximately 2 cm proximal to the wrist crease under direct vision.  The canal was explored.  Area compression of the nerve was apparent.  Motor branch entered from the ulnar  side of the nerve through the flexor retinaculum was found to be entirely intact.  Wound was copiously irrigated with saline.  The skin was closed interrupted 4 nylon sutures.  A local infiltration quarter percent bupivacaine without epinephrine was given approximately 8 cc was used.  A sterile compressive dressing with the fingers free was applied.  Inflation of the tourniquet all fingers immediately pink.  She was taken to the recovery room for observation in satisfactory condition.  She will be discharged home to return to [facility] in 1 week on Tylenol extra strength and tramadol for breakthrough.

## 2017-10-31 NOTE — Brief Op Note (Signed)
10/31/2017  10:12 AM  PATIENT:  [name]  82 y.o. female  PRE-OPERATIVE DIAGNOSIS:  RIGHT CARPAL TUNNEL SYNDROME  POST-OPERATIVE DIAGNOSIS:  RIGHT CARPAL TUNNEL SYNDROME  PROCEDURE:  Procedure(s): RIGHT CARPAL TUNNEL RELEASE (Right)  SURGEON:  Surgeon(s) and Role:    * Daryll Brod, MD - Primary  PHYSICIAN ASSISTANT:   ASSISTANTS: none   ANESTHESIA:   local, regional and IV sedation  EBL:  1 mL   BLOOD ADMINISTERED:none  DRAINS: none   LOCAL MEDICATIONS USED:  BUPIVICAINE   SPECIMEN:  No Specimen  DISPOSITION OF SPECIMEN:  N/A  COUNTS:  YES  TOURNIQUET:   Total Tourniquet Time Documented: Forearm (Right) - 19 minutes Total: Forearm (Right) - 19 minutes   DICTATION: .Viviann Spare Dictation  PLAN OF CARE: Discharge to home after PACU  PATIENT DISPOSITION:  PACU - hemodynamically stable.

## 2017-10-31 NOTE — Addendum Note (Signed)
Addended by: Zannie Cove on: 10/31/2017 02:59 PM   Modules accepted: Orders

## 2017-10-31 NOTE — Anesthesia Postprocedure Evaluation (Signed)
Anesthesia Post Note  Patient: Lori Soto  Procedure(s) Performed: RIGHT CARPAL TUNNEL RELEASE (Right Wrist)     Patient location during evaluation: PACU Anesthesia Type: Bier Block Level of consciousness: awake and alert Pain management: pain level controlled Vital Signs Assessment: post-procedure vital signs reviewed and stable Respiratory status: spontaneous breathing, nonlabored ventilation, respiratory function stable and patient connected to nasal cannula oxygen Cardiovascular status: stable and blood pressure returned to baseline Postop Assessment: no apparent nausea or vomiting Anesthetic complications: no    Last Vitals:  Vitals:   10/31/17 1115 10/31/17 1133  BP: 135/67 (!) 155/80  Pulse: (!) 52 (!) 59  Resp: 13 20  Temp:  36.5 C  SpO2: 98% 100%    Last Pain:  Vitals:   10/31/17 1133  TempSrc: Oral  PainSc: 0-No pain                 Effie Berkshire

## 2017-10-31 NOTE — Discharge Instructions (Addendum)
[facility] Instructions Hand Surgery  Wound Care: Keep your hand elevated above the level of your heart.  Do not allow it to dangle by your side.  Keep the dressing dry and do not remove it unless your doctor advises you to do so.  He will usually change it at the time of your post-op visit.  Moving your fingers is advised to stimulate circulation but will depend on the site of your surgery.  If you have a splint applied, your doctor will advise you regarding movement.  Activity: Do not drive or operate machinery today.  Rest today and then you may return to your normal activity and work as indicated by your physician.  Diet:  Drink liquids today or eat a light diet.  You may resume a regular diet tomorrow.    General expectations: Pain for two to three days. Fingers may become slightly swollen.  Call your doctor if any of the following occur: Severe pain not relieved by pain medication. Elevated temperature. Dressing soaked with blood. Inability to move fingers. White or bluish color to fingers.   Post Anesthesia Home Care Instructions  Activity: Get plenty of rest for the remainder of the day. A responsible individual must stay with you for 24 hours following the procedure.  For the next 24 hours, DO NOT: -Drive a car -Paediatric nurse -Drink alcoholic beverages -Take any medication unless instructed by your physician -Make any legal decisions or sign important papers.  Meals: Start with liquid foods such as gelatin or soup. Progress to regular foods as tolerated. Avoid greasy, spicy, heavy foods. If nausea and/or vomiting occur, drink only clear liquids until the nausea and/or vomiting subsides. Call your physician if vomiting continues.  Special Instructions/Symptoms: Your throat may feel dry or sore from the anesthesia or the breathing tube placed in your throat during surgery. If this causes discomfort, gargle with warm salt water. The discomfort should disappear within  24 hours.  If you had a scopolamine patch placed behind your ear for the management of post- operative nausea and/or vomiting:  1. The medication in the patch is effective for 72 hours, after which it should be removed.  Wrap patch in a tissue and discard in the trash. Wash hands thoroughly with soap and water. 2. You may remove the patch earlier than 72 hours if you experience unpleasant side effects which may include dry mouth, dizziness or visual disturbances. 3. Avoid touching the patch. Wash your hands with soap and water after contact with the patch.    Post Anesthesia Home Care Instructions  Activity: Get plenty of rest for the remainder of the day. A responsible individual must stay with you for 24 hours following the procedure.  For the next 24 hours, DO NOT: -Drive a car -Paediatric nurse -Drink alcoholic beverages -Take any medication unless instructed by your physician -Make any legal decisions or sign important papers.  Meals: Start with liquid foods such as gelatin or soup. Progress to regular foods as tolerated. Avoid greasy, spicy, heavy foods. If nausea and/or vomiting occur, drink only clear liquids until the nausea and/or vomiting subsides. Call your physician if vomiting continues.  Special Instructions/Symptoms: Your throat may feel dry or sore from the anesthesia or the breathing tube placed in your throat during surgery. If this causes discomfort, gargle with warm salt water. The discomfort should disappear within 24 hours.  If you had a scopolamine patch placed behind your ear for the management of post- operative nausea and/or vomiting:  1. The medication in the patch is effective for 72 hours, after which it should be removed.  Wrap patch in a tissue and discard in the trash. Wash hands thoroughly with soap and water. 2. You may remove the patch earlier than 72 hours if you experience unpleasant side effects which may include dry mouth, dizziness or visual  disturbances. 3. Avoid touching the patch. Wash your hands with soap and water after contact with the patch.

## 2017-10-31 NOTE — Transfer of Care (Signed)
Immediate Anesthesia Transfer of Care Note  Patient: Jolayne Haines Suzzanne Cloud  Procedure(s) Performed: RIGHT CARPAL TUNNEL RELEASE (Right Wrist)  Patient Location: PACU  Anesthesia Type:MAC and Bier block  Level of Consciousness: awake, alert  and oriented  Airway & Oxygen Therapy: Patient Spontanous Breathing and Patient connected to face mask oxygen  Post-op Assessment: Report given to RN and Post -op Vital signs reviewed and stable  Post vital signs: Reviewed and stable  Last Vitals:  Vitals Value Taken Time  BP 142/77 10/31/2017 10:17 AM  Temp    Pulse 57 10/31/2017 10:18 AM  Resp 15 10/31/2017 10:18 AM  SpO2 96 % 10/31/2017 10:18 AM  Vitals shown include unvalidated device data.  Last Pain:  Vitals:   10/31/17 0925  TempSrc: Oral  PainSc: 3       Patients Stated Pain Goal: 0 (34/28/76 8115)  Complications: No apparent anesthesia complications

## 2017-11-01 ENCOUNTER — Encounter (HOSPITAL_BASED_OUTPATIENT_CLINIC_OR_DEPARTMENT_OTHER): Payer: Self-pay | Admitting: Orthopedic Surgery

## 2017-11-21 ENCOUNTER — Ambulatory Visit (INDEPENDENT_AMBULATORY_CARE_PROVIDER_SITE_OTHER): Payer: Medicare Other | Admitting: *Deleted

## 2017-11-21 VITALS — BP 133/73 | HR 60 | Temp 97.0°F | Ht 62.0 in | Wt 134.0 lb

## 2017-11-21 DIAGNOSIS — I10 Essential (primary) hypertension: Secondary | ICD-10-CM

## 2017-11-21 DIAGNOSIS — Z0000 Encounter for general adult medical examination without abnormal findings: Secondary | ICD-10-CM | POA: Diagnosis not present

## 2017-11-21 DIAGNOSIS — E559 Vitamin D deficiency, unspecified: Secondary | ICD-10-CM

## 2017-11-21 DIAGNOSIS — Z1211 Encounter for screening for malignant neoplasm of colon: Secondary | ICD-10-CM | POA: Diagnosis not present

## 2017-11-21 DIAGNOSIS — Z8249 Family history of ischemic heart disease and other diseases of the circulatory system: Secondary | ICD-10-CM | POA: Diagnosis not present

## 2017-11-21 NOTE — Patient Instructions (Signed)
  Ms. Fake , Thank you for taking time to come for your Medicare Wellness Visit. I appreciate your ongoing commitment to your health goals. Please review the following plan we discussed and let me know if I can assist you in the future.   These are the goals we discussed: Goals    . Prevent falls     Stay active Follow up with scoliosis specialist        This is a list of the screening recommended for you and due dates:  Health Maintenance  Topic Date Due  . Flu Shot  12/23/2019*  . Mammogram  11/24/2017  . DEXA scan (bone density measurement)  03/02/2018  . Tetanus Vaccine  01/22/2025  . Pneumonia vaccines  Completed  *Topic was postponed. The date shown is not the original due date.     Keep follow up with Dr Laurance Flatten and other specialist Stay active and try to prevent falls

## 2017-11-21 NOTE — Progress Notes (Signed)
Subjective:   Lori Soto is a 82 y.o. female who presents for Medicare Annual (Subsequent) preventive examination. 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 For exercise, she does yard work, stretches and walks 2 flights of stairs. She states that her diet is healthy and she typically gets in 2 meals a day with a healthy snack. She is very active in her church. She lives at home alone and she has 3 children, 8 grandchildren and 2 great-grands. She currently does not have any pets. Fall hazards and risks were discussed today. She states that her health is about the same as a year ago.   Cardiac Risk Factors include: advanced age (>50mn, >>16women)     Objective:     Vitals: BP 133/73 (BP Location: Left Arm)   Pulse 60   Temp (!) 97 F (36.1 C) (Oral)   Ht 5' 2"  (1.575 m)   Wt 134 lb (60.8 kg)   BMI 24.51 kg/m   Body mass index is 24.51 kg/m.  Advanced Directives 11/21/2017 10/31/2017 10/25/2017 01/31/2017 11/17/2016 01/05/2016 11/24/2015  Does Patient Have a Medical Advance Directive? Yes Yes Yes No Yes Yes Yes  Type of Advance Directive Living will;Healthcare Power of Attorney Living will Living will - [place] - -  Does patient want to make changes to medical advance directive? No - Patient declined No - Patient declined No - Patient declined - No - Patient declined - -  Copy of [place]? No - copy requested No - copy requested No - copy requested - No - copy requested - -  Pre-existing out of facility DNR order (yellow form or pink MOST form) - - - - - - -    Tobacco Social History   Tobacco Use  Smoking Status Never Smoker  Smokeless Tobacco Never Used  Tobacco Comment   Never smoker     Counseling given: Not Answered Comment: Never smoker   Past Medical History:  Diagnosis Date  . Anemia   . Arthritis   . [place]  . Carpal tunnel  syndrome, bilateral   . Cataract   . Esophagitis   . GERD (gastroesophageal reflux disease)   . H/O hiatal hernia   . Osteopenia   . Scoliosis   . Shingles   . Ulcerative colitis    Past Surgical History:  Procedure Laterality Date  . ABDOMINAL HYSTERECTOMY    . BUNIONECTOMY     Bilateral  . CARPAL TUNNEL RELEASE Left 03/10/2015   Procedure: LEFT CARPAL TUNNEL RELEASE;  Surgeon: GDaryll Brod MD;  [facility]  Service: Orthopedics;  Laterality: Left;  . CARPAL TUNNEL RELEASE Right 10/31/2017   Procedure: RIGHT CARPAL TUNNEL RELEASE;  Surgeon: KDaryll Brod MD;  [place] [facility]  Service: Orthopedics;  Laterality: Right;  . CATARACT EXTRACTION W/PHACO  05/28/2012   Procedure: CATARACT EXTRACTION PHACO AND INTRAOCULAR LENS PLACEMENT (IOC);  Surgeon: KTonny Branch MD;  [facility];  Service: Ophthalmology;  Laterality: Right;  CDE=12.84  . CATARACT EXTRACTION W/PHACO  06/07/2012   Procedure: CATARACT EXTRACTION PHACO AND INTRAOCULAR LENS PLACEMENT (IOC);  Surgeon: KTonny Branch MD;  Location: [facility];  Service: Ophthalmology;  Laterality: Left;  CDE 17.60  . COLONOSCOPY    . COLONOSCOPY N/A 07/08/2015   Procedure: COLONOSCOPY;  Surgeon: NRogene Houston MD;  [facility];  Service: Endoscopy;  Laterality: N/A;  930  . ESOPHAGEAL DILATION N/A 07/08/2015   Procedure: ESOPHAGEAL DILATION;  Surgeon: Rogene Houston, MD;  [facility];  Service: Endoscopy;  Laterality: N/A;  . ESOPHAGOGASTRODUODENOSCOPY N/A 07/08/2015   Procedure: ESOPHAGOGASTRODUODENOSCOPY (EGD);  Surgeon: Rogene Houston, MD;  [facility];  Service: Endoscopy;  Laterality: N/A;  . Hernia     Right inguinal  . HERNIA REPAIR  1961   right inguinal hernia  . UPPER GASTROINTESTINAL ENDOSCOPY     Family History  Problem Relation Age of Onset  . Arthritis Mother   . Heart disease Mother   . Cancer Father        pancreat  . Pancreatic cancer Father   .  Arthritis Father        back issues   . Cancer Brother        lung and liver  . Liver cancer Brother   . Lung cancer Brother   . Heart disease Sister        Rheumatic Fever  . Peptic Ulcer Sister   . Diabetes Sister   . Colon cancer Sister   . Edema Sister   . GI problems Sister        diverticulitis  . Diabetes Sister   . Neuropathy Sister   . COPD Sister   . Hiatal hernia Sister   . GI problems Sister    Social History   Socioeconomic History  . Marital status: Widowed    Spouse name: Not on file  . Number of children: 3  . Years of education: Not on file  . Highest education level: Not on file  Occupational History    Employer: RETIRED    Comment: accounting  Social Needs  . Financial resource strain: Not on file  . Food insecurity:    Worry: Not on file    Inability: Not on file  . Transportation needs:    Medical: Not on file    Non-medical: Not on file  Tobacco Use  . Smoking status: Never Smoker  . Smokeless tobacco: Never Used  . Tobacco comment: Never smoker  Substance and Sexual Activity  . Alcohol use: Yes    Alcohol/week: 0.0 oz    Comment: Very Rarely  . Drug use: No  . Sexual activity: Not Currently    Birth control/protection: Surgical  Lifestyle  . Physical activity:    Days per week: Not on file    Minutes per session: Not on file  . Stress: Not on file  Relationships  . Social connections:    Talks on phone: Not on file    Gets together: Not on file    Attends religious service: Not on file    Active member of club or organization: Not on file    Attends meetings of clubs or organizations: Not on file    Relationship status: Not on file  Other Topics Concern  . Not on file  Social History Narrative   Lives alone.    Outpatient Encounter Medications as of 11/21/2017  Medication Sig  . acetaminophen (TYLENOL 8 HOUR ARTHRITIS PAIN) 650 MG CR tablet Take 650 mg by mouth every 8 (eight) hours as needed. Patient states that she takes  prn for arthritis in her knees.   . Bromelains 500 MG TABS Take 500 mg by mouth daily.  . Calcium Carbonate-Vitamin D (CALCIUM 600+D) 600-400 MG-UNIT per tablet Take 1 tablet by mouth every evening.  [name]  500 MG CAPS Take 1 capsule by mouth daily.   . Coenzyme Q10 (CO Q-10 PO) Take 1 tablet by mouth daily. With red yeast rice [employer] Brand  . COLLAGEN PO Take 1 tablet by mouth daily. This is known as 1-2-3  . Cyanocobalamin (VITAMIN B 12 PO) Take 1,000 mcg by mouth daily.   [name] estradiol (ESTRACE) 1 MG tablet Take 1 mg by mouth every other day.   . Ginkgo Biloba 40 MG TABS Take 40 mg by mouth daily.   [name] glucosamine-chondroitin 500-400 MG tablet Take 1 tablet by mouth 2 (two) times daily.    [name] Hyaluronic Acid-Vitamin C (HYALURONIC ACID PO) Take 80 mg by mouth daily.  . Mesalamine (ASACOL HD) 800 MG TBEC Take 2 tablets (1,600 mg total) by mouth daily.  [name] MILK THISTLE PO Take 100 mg by mouth daily.  . MULTIPLE VITAMINS PO Take 1 tablet by mouth daily. In the mornings  . Nutritional Supplements (GRAPESEED EXTRACT PO) Take 60 mg by mouth daily.   [name] OAT BRAN SOLUBLE PO Take 1 tablet by mouth daily.  [name] OVER THE COUNTER MEDICATION Take 66 mg by mouth daily. Vision Gold Lutein  . OVER THE COUNTER MEDICATION Take 250 mg by mouth daily. Alphalipoic Acid 250 mg daily  . pantoprazole (PROTONIX) 40 MG tablet Take 1 tablet (40 mg total) by mouth daily before breakfast.  . POTASSIUM GLUCONATE PO Take 99 mg by mouth daily. This is Chelated Potassium -[employer]  . pyridOXINE (VITAMIN B-6) 100 MG tablet Take 100 mg by mouth daily.   . traMADol (ULTRAM) 50 MG tablet Take 1 tablet (50 mg total) by mouth every 6 (six) hours as needed.  . Turmeric 500 MG CAPS Take 1 capsule by mouth 2 (two) times daily.   . Vitamins C E (CRANBERRY CONCENTRATE PO) Take 2,500 mg by mouth daily.   . Zinc 50 MG CAPS Take 50 mg by mouth daily.    No facility-administered encounter medications on file as of 11/21/2017.       Activities of Daily Living In your present state of health, do you have any difficulty performing the following activities: 11/21/2017 10/31/2017  Hearing? N N  Vision? N N  Difficulty concentrating or making decisions? N N  Walking or climbing stairs? N N  Dressing or bathing? N N  Doing errands, shopping? N -  Preparing Food and eating ? N -  Using the Toilet? N -  In the past six months, have you accidently leaked urine? N -  Do you have problems with loss of bowel control? N -  Managing your Medications? N -  Managing your Finances? N -  Housekeeping or managing your Housekeeping? N -  Some recent data might be hidden    Patient Care Team: Chipper Herb, MD as PCP - General (Family Medicine) Rogene Houston, MD as Consulting Physician (Gastroenterology) Gaynelle Arabian, MD as Consulting Physician (Orthopedic Surgery) Daryll Brod, MD as Consulting Physician (Orthopedic Surgery) Minus Breeding, MD as Consulting Physician (Cardiology) Particia Nearing, OD (Optometry)    Assessment:   This is a routine wellness examination for Lori Soto.  Exercise Activities and Dietary recommendations Current Exercise Habits: Home exercise routine, Type of exercise: walking;stretching(yard and stairs ), Time (Minutes): 30, Frequency (Times/Week): 7, Weekly Exercise (Minutes/Week): 210, Intensity: Mild, Exercise limited by: None identified  Goals    . Prevent falls     Stay active Follow up with scoliosis specialist  Fall Risk Fall Risk  11/21/2017 10/30/2017 06/22/2017 12/22/2016 11/17/2016  Falls in the past year? No No No No No   Is the patient's home free of loose throw rugs in walkways, pet beds, electrical cords, etc?   Fall hazards and risks were discussed today  Depression Screen PHQ 2/9 Scores 11/21/2017 10/30/2017 06/22/2017 12/22/2016  PHQ - 2 Score 0 1 1 0  PHQ- 9 Score - - - -     Cognitive Function MMSE - Mini Mental State Exam 11/21/2017 11/17/2016 11/16/2015 01/23/2015   Orientation to time 5 5 5 5   Orientation to Place 5 5 5 5   Registration 3 3 3 3   Attention/ Calculation 5 5 5 4   Recall 3 3 3 3   Language- name 2 objects 2 2 2 2   Language- repeat 1 1 1 1   Language- follow 3 step command 3 3 3 2   Language- read & follow direction 1 1 1 1   Write a sentence 1 1 1 1   Copy design 1 1 1 1   Total score 30 30 30 28         Immunization History  Administered Date(s) Administered  . Pneumococcal Conjugate-13 02/26/2014  . Pneumococcal Polysaccharide-23 03/20/2012  . Tdap 01/23/2015    Qualifies for Shingles Vaccine?refused   Screening Tests Health Maintenance  Topic Date Due  . INFLUENZA VACCINE  12/23/2019 (Originally 11/30/2017)  . MAMMOGRAM  11/24/2017  . DEXA SCAN  03/02/2018  . TETANUS/TDAP  01/22/2025  . PNA vac Low Risk Adult  Completed    Cancer Screenings: Lung: Low Dose CT Chest recommended if Age 80-80 years, 30 pack-year currently smoking OR have quit w/in 15years. Patient does qualify. Breast:  Up to date on Mammogram? Yes   Up to date of Bone Density/Dexa? Yes Colorectal: brought in today  Additional Screenings: declined Hepatitis C Screening:      Plan:   pt is to keep follow up with Dr Laurance Flatten and other specialist.  She is to stay active and try to prevent falls.  I have personally reviewed and noted the following in the patient's chart:   . Medical and social history . Use of alcohol, tobacco or illicit drugs  . Current medications and supplements . Functional ability and status . Nutritional status . Physical activity . Advanced directives . List of other physicians . Hospitalizations, surgeries, and ER visits in previous 12 months . Vitals . Screenings to include cognitive, depression, and falls . Referrals and appointments  In addition, I have reviewed and discussed with patient certain preventive protocols, quality metrics, and best practice recommendations. A written personalized care plan for preventive  services as well as general preventive health recommendations were provided to patient.     [place] Dec, [place]  [id number]/[id number]

## 2017-11-22 ENCOUNTER — Other Ambulatory Visit: Payer: Self-pay

## 2017-11-22 DIAGNOSIS — E875 Hyperkalemia: Secondary | ICD-10-CM

## 2017-11-22 LAB — CBC WITH DIFFERENTIAL/PLATELET
Basophils Absolute: 0 10*3/uL (ref 0.0–0.2)
Basos: 1 %
EOS (ABSOLUTE): 0.1 10*3/uL (ref 0.0–0.4)
EOS: 2 %
HEMOGLOBIN: 13.6 g/dL (ref 11.1–15.9)
Hematocrit: 40.6 % (ref 34.0–46.6)
Immature Grans (Abs): 0 10*3/uL (ref 0.0–0.1)
Immature Granulocytes: 0 %
LYMPHS: 34 %
Lymphocytes Absolute: 1.7 10*3/uL (ref 0.7–3.1)
MCH: 29.9 pg (ref 26.6–33.0)
MCHC: 33.5 g/dL (ref 31.5–35.7)
MCV: 89 fL (ref 79–97)
Monocytes Absolute: 0.6 10*3/uL (ref 0.1–0.9)
Monocytes: 12 %
NEUTROS ABS: 2.6 10*3/uL (ref 1.4–7.0)
Neutrophils: 51 %
Platelets: 259 10*3/uL (ref 150–450)
RBC: 4.55 x10E6/uL (ref 3.77–5.28)
RDW: 13.8 % (ref 12.3–15.4)
WBC: 4.9 10*3/uL (ref 3.4–10.8)

## 2017-11-22 LAB — BMP8+EGFR
BUN/Creatinine Ratio: 12 (ref 12–28)
BUN: 11 mg/dL (ref 8–27)
CO2: 28 mmol/L (ref 20–29)
Calcium: 9.9 mg/dL (ref 8.7–10.3)
Chloride: 102 mmol/L (ref 96–106)
Creatinine, Ser: 0.89 mg/dL (ref 0.57–1.00)
GFR calc Af Amer: 71 mL/min/{1.73_m2} (ref >59)
GFR calc non Af Amer: 62 mL/min/{1.73_m2} (ref >59)
GLUCOSE: 89 mg/dL (ref 65–99)
POTASSIUM: 5.4 mmol/L — AB (ref 3.5–5.2)
SODIUM: 144 mmol/L (ref 134–144)

## 2017-11-22 LAB — HEPATIC FUNCTION PANEL
ALT: 15 IU/L (ref 0–32)
AST: 26 IU/L (ref 0–40)
Albumin: 4.4 g/dL (ref 3.5–4.8)
Alkaline Phosphatase: 39 IU/L (ref 39–117)
Bilirubin Total: 0.6 mg/dL (ref 0.0–1.2)
Bilirubin, Direct: 0.19 mg/dL (ref 0.00–0.40)
Total Protein: 7 g/dL (ref 6.0–8.5)

## 2017-11-22 LAB — LIPID PANEL
Chol/HDL Ratio: 2.9 ratio (ref 0.0–4.4)
Cholesterol, Total: 191 mg/dL (ref 100–199)
HDL: 67 mg/dL (ref >39)
LDL CALC: 115 mg/dL — AB (ref 0–99)
Triglycerides: 43 mg/dL (ref 0–149)
VLDL CHOLESTEROL CAL: 9 mg/dL (ref 5–40)

## 2017-11-22 LAB — VITAMIN D 25 HYDROXY (VIT D DEFICIENCY, FRACTURES): Vit D, 25-Hydroxy: 48.6 ng/mL (ref 30.0–100.0)

## 2017-11-23 DIAGNOSIS — M4186 Other forms of scoliosis, lumbar region: Secondary | ICD-10-CM | POA: Diagnosis not present

## 2017-11-23 DIAGNOSIS — M47816 Spondylosis without myelopathy or radiculopathy, lumbar region: Secondary | ICD-10-CM | POA: Diagnosis not present

## 2017-11-23 DIAGNOSIS — M419 Scoliosis, unspecified: Secondary | ICD-10-CM | POA: Diagnosis not present

## 2017-11-24 LAB — FECAL OCCULT BLOOD, IMMUNOCHEMICAL: FECAL OCCULT BLD: NEGATIVE

## 2017-12-02 ENCOUNTER — Encounter (HOSPITAL_COMMUNITY): Payer: Self-pay | Admitting: Emergency Medicine

## 2017-12-02 ENCOUNTER — Emergency Department (HOSPITAL_COMMUNITY)
Admission: EM | Admit: 2017-12-02 | Discharge: 2017-12-02 | Disposition: A | Discharge status: Home / Self Care | Payer: Medicare Other | Attending: Emergency Medicine | Admitting: Emergency Medicine

## 2017-12-02 ENCOUNTER — Other Ambulatory Visit: Payer: Self-pay

## 2017-12-02 DIAGNOSIS — Z79899 Other long term (current) drug therapy: Secondary | ICD-10-CM | POA: Diagnosis not present

## 2017-12-02 DIAGNOSIS — Z85828 Personal history of other malignant neoplasm of skin: Secondary | ICD-10-CM | POA: Diagnosis not present

## 2017-12-02 DIAGNOSIS — R002 Palpitations: Secondary | ICD-10-CM | POA: Diagnosis not present

## 2017-12-02 DIAGNOSIS — R000 Tachycardia, unspecified: Secondary | ICD-10-CM | POA: Diagnosis present

## 2017-12-02 LAB — CBC
HCT: 42.3 % (ref 36.0–46.0)
Hemoglobin: 14.2 g/dL (ref 12.0–15.0)
MCH: 30 pg (ref 26.0–34.0)
MCHC: 33.6 g/dL (ref 30.0–36.0)
MCV: 89.4 fL (ref 78.0–100.0)
PLATELETS: 249 10*3/uL (ref 150–400)
RBC: 4.73 MIL/uL (ref 3.87–5.11)
RDW: 13.5 % (ref 11.5–15.5)
WBC: 6.3 10*3/uL (ref 4.0–10.5)

## 2017-12-02 LAB — BASIC METABOLIC PANEL
Anion gap: 11 (ref 5–15)
BUN: 10 mg/dL (ref 8–23)
CALCIUM: 9.4 mg/dL (ref 8.9–10.3)
CHLORIDE: 103 mmol/L (ref 98–111)
CO2: 24 mmol/L (ref 22–32)
CREATININE: 0.76 mg/dL (ref 0.44–1.00)
GFR calc Af Amer: >60 mL/min (ref >60)
GFR calc non Af Amer: >60 mL/min (ref >60)
Glucose, Bld: 112 mg/dL — ABNORMAL HIGH (ref 70–99)
Potassium: 3.9 mmol/L (ref 3.5–5.1)
SODIUM: 138 mmol/L (ref 135–145)

## 2017-12-02 LAB — TSH: TSH: 1.357 u[IU]/mL (ref 0.350–4.500)

## 2017-12-02 LAB — TROPONIN I

## 2017-12-02 NOTE — ED Triage Notes (Signed)
Patient c/o tachycardia. Patient states at a distant cousins funeral. Per patient she didn't sleep well. Denies any chest pain or shortness of breath. Patient fills like she has indigestion from a fried pie she ate earlier.

## 2017-12-02 NOTE — ED Notes (Signed)
Out of bed to bathroom  Returned to bed and continues in SR  No arrythmia noted

## 2017-12-02 NOTE — ED Notes (Signed)
Pt reports stressful week  At funeral today and felt heart racing  Hx of single event arrythmia earlier this year   Recent carpal tunnel surgery

## 2017-12-02 NOTE — Discharge Instructions (Addendum)
Follow-up with local cardiologist to discuss wearing a monitor. Return to the ER or see a clinician if you develop chest pain, shortness of breath, passout or other concerns.

## 2017-12-02 NOTE — ED Notes (Signed)
Dr. Z at bedside.

## 2017-12-02 NOTE — ED Provider Notes (Signed)
[facility] EMERGENCY DEPARTMENT Provider Note   CSN: [id number] Arrival date & time: 12/02/17  1251     History   Chief Complaint Chief Complaint  Patient presents with  . Tachycardia    HPI Lori Soto is a 82 y.o. female.  Patient with history of anemia, ulcerative colitis presents after episode of feeling her heart racing earlier today.  Patient's had significant stress with death in the family, funeral service and was feeling intermittent heart racing sensations.  No syncope chest pain or shortness of breath.  No blood clot history, no recent surgeries.  No active bleeding.  Symptoms resolved.  No thyroid history known or weight loss.     Past Medical History:  Diagnosis Date  . Anemia   . Arthritis   . Cancer ([place])    skin cancer on nose  . Carpal tunnel syndrome, bilateral   . Cataract   . Esophagitis   . GERD (gastroesophageal reflux disease)   . H/O hiatal hernia   . Osteopenia   . Scoliosis   . Shingles   . Ulcerative colitis     Patient Active Problem List   Diagnosis Date Noted  . Osteopenia of the elderly 02/19/2014  . DDD (degenerative disc disease), lumbar 12/03/2013  . Scoliosis 12/03/2013  . Chronic LBP 05/14/2013  . IBS (irritable bowel syndrome) 11/08/2012  . UC (ulcerative colitis) ([place]) 04/23/2012  . Palpitation 10/26/2011  . Hypertension 10/26/2011  . Chronic ulcerative colitis ([place]) 03/23/2011    Past Surgical History:  Procedure Laterality Date  . ABDOMINAL HYSTERECTOMY    . BUNIONECTOMY     Bilateral  . CARPAL TUNNEL RELEASE Left 03/10/2015   Procedure: LEFT CARPAL TUNNEL RELEASE;  Surgeon: Daryll Brod, MD;  Location: [place];  Service: Orthopedics;  Laterality: Left;  . CARPAL TUNNEL RELEASE Right 10/31/2017   Procedure: RIGHT CARPAL TUNNEL RELEASE;  Surgeon: Daryll Brod, MD;  Location: [place];  Service: Orthopedics;  Laterality: Right;  . CATARACT EXTRACTION W/PHACO  05/28/2012   Procedure:  CATARACT EXTRACTION PHACO AND INTRAOCULAR LENS PLACEMENT (IOC);  Surgeon: Tonny Branch, MD;  Location: [facility];  Service: Ophthalmology;  Laterality: Right;  CDE=12.84  . CATARACT EXTRACTION W/PHACO  06/07/2012   Procedure: CATARACT EXTRACTION PHACO AND INTRAOCULAR LENS PLACEMENT (IOC);  Surgeon: Tonny Branch, MD;  Location: [facility];  Service: Ophthalmology;  Laterality: Left;  CDE 17.60  . COLONOSCOPY    . COLONOSCOPY N/A 07/08/2015   Procedure: COLONOSCOPY;  Surgeon: Rogene Houston, MD;  [facility];  Service: Endoscopy;  Laterality: N/A;  930  . ESOPHAGEAL DILATION N/A 07/08/2015   Procedure: ESOPHAGEAL DILATION;  Surgeon: Rogene Houston, MD;  [facility];  Service: Endoscopy;  Laterality: N/A;  . ESOPHAGOGASTRODUODENOSCOPY N/A 07/08/2015   Procedure: ESOPHAGOGASTRODUODENOSCOPY (EGD);  Surgeon: Rogene Houston, MD;  [facility];  Service: Endoscopy;  Laterality: N/A;  . Hernia     Right inguinal  . HERNIA REPAIR  1961   right inguinal hernia  . UPPER GASTROINTESTINAL ENDOSCOPY       OB History   None      Home Medications    Prior to Admission medications   Medication Sig Start Date End Date Taking? Authorizing Provider  acetaminophen (TYLENOL 8 HOUR ARTHRITIS PAIN) 650 MG CR tablet Take 650 mg by mouth every 8 (eight) hours as needed. Patient states that she takes prn for arthritis in her knees.    Yes [provider]  Bromelains 500 MG TABS Take 500 mg by mouth daily.   Yes [provider]  Calcium Carbonate-Vitamin D (CALCIUM 600+D) 600-400 MG-UNIT per tablet Take 1 tablet by mouth every evening. 01/23/15  Yes Eckard, Tammy, PharmD  Cayenne 500 MG CAPS Take 1 capsule by mouth daily.    Yes [provider]  Coenzyme Q10 (CO Q-10 PO) Take 1 tablet by mouth daily. With red yeast rice [employer] Brand   Yes [provider]  COLLAGEN PO Take 1 tablet by mouth daily. This is known as 1-2-3   Yes [provider]    Cyanocobalamin (VITAMIN B 12 PO) Take 1,000 mcg by mouth daily.    Yes [provider]  estradiol (ESTRACE) 1 MG tablet Take 1 mg by mouth every other day.    Yes [provider]  Ginkgo Biloba 40 MG TABS Take 40 mg by mouth daily.    Yes [provider]  glucosamine-chondroitin 500-400 MG tablet Take 1 tablet by mouth 2 (two) times daily.     Yes [provider]  Hyaluronic Acid-Vitamin C (HYALURONIC ACID PO) Take 80 mg by mouth daily.   Yes [provider]  Mesalamine (ASACOL HD) 800 MG TBEC Take 2 tablets (1,600 mg total) by mouth daily. 01/10/17  Yes Rehman, Mechele Dawley, MD  MILK THISTLE PO Take 100 mg by mouth daily.   Yes [provider]  MULTIPLE VITAMINS PO Take 1 tablet by mouth daily. In the mornings   Yes [provider]  Nutritional Supplements (GRAPESEED EXTRACT PO) Take 60 mg by mouth daily.    Yes [provider]  OAT BRAN SOLUBLE PO Take 1 tablet by mouth daily.   Yes [provider]  OVER THE COUNTER MEDICATION Take 66 mg by mouth daily. Vision Gold Lutein   Yes [provider]  OVER THE COUNTER MEDICATION Take 250 mg by mouth daily. Alphalipoic Acid 250 mg daily   Yes [provider]  pantoprazole (PROTONIX) 40 MG tablet Take 1 tablet (40 mg total) by mouth daily before breakfast. 08/07/17  Yes Rehman, Mechele Dawley, MD  POTASSIUM GLUCONATE PO Take 99 mg by mouth daily. This is Chelated Potassium -Puritans Pride   Yes [provider]  pyridOXINE (VITAMIN B-6) 100 MG tablet Take 100 mg by mouth daily.    Yes [provider]  Turmeric 500 MG CAPS Take 1 capsule by mouth 2 (two) times daily.    Yes [provider]  Vitamins C E (CRANBERRY CONCENTRATE PO) Take 2,500 mg by mouth daily.    Yes [provider]  Zinc 50 MG CAPS Take 50 mg by mouth daily.    Yes [provider]  traMADol (ULTRAM) 50 MG tablet Take 1 tablet (50 mg total) by mouth every 6  (six) hours as needed. Patient not taking: Reported on 12/02/2017 10/31/17   Daryll Brod, MD    Family History Family History  Problem Relation Age of Onset  . Arthritis Mother   . Heart disease Mother   . Cancer Father        pancreat  . Pancreatic cancer Father   . Arthritis Father        back issues   . Cancer Brother        lung and liver  . Liver cancer Brother   . Lung cancer Brother   . Heart disease Sister        Rheumatic Fever  . Peptic  Ulcer Sister   . Diabetes Sister   . Colon cancer Sister   . Edema Sister   . GI problems Sister        diverticulitis  . Diabetes Sister   . Neuropathy Sister   . COPD Sister   . Hiatal hernia Sister   . GI problems Sister     Social History Social History   Tobacco Use  . Smoking status: Never Smoker  . Smokeless tobacco: Never Used  . Tobacco comment: Never smoker  Substance Use Topics  . Alcohol use: Yes    Alcohol/week: 0.0 oz    Comment: Very Rarely  . Drug use: No     Allergies   Penicillins; Tape; and Influenza virus vacc split pf   Review of Systems Review of Systems  Constitutional: Negative for chills and fever.  HENT: Negative for congestion.   Eyes: Negative for visual disturbance.  Respiratory: Negative for shortness of breath.   Cardiovascular: Positive for palpitations. Negative for chest pain.  Gastrointestinal: Negative for abdominal pain and vomiting.  Genitourinary: Negative for dysuria and flank pain.  Musculoskeletal: Negative for back pain, neck pain and neck stiffness.  Skin: Negative for rash.  Neurological: Negative for light-headedness and headaches.     Physical Exam Updated Vital Signs BP 111/76   Pulse 80   Temp 97.9 F (36.6 C) (Oral)   Resp 17   Ht 5' 0.25" (1.53 m)   Wt 60.8 kg (134 lb)   SpO2 100%   BMI 25.95 kg/m   Physical Exam  Constitutional: She is oriented to person, place, and time. She appears well-developed and well-nourished.  HENT:  Head: Normocephalic  and atraumatic.  Eyes: Conjunctivae are normal. Right eye exhibits no discharge. Left eye exhibits no discharge.  Neck: Normal range of motion. Neck supple. No tracheal deviation present.  Cardiovascular: Normal rate and regular rhythm.  Pulmonary/Chest: Effort normal and breath sounds normal.  Abdominal: Soft. She exhibits no distension. There is no tenderness. There is no guarding.  Musculoskeletal: She exhibits no edema.  Neurological: She is alert and oriented to person, place, and time. No cranial nerve deficit.  Skin: Skin is warm. No rash noted.  Psychiatric: She has a normal mood and affect.  Nursing note and vitals reviewed.    ED Treatments / Results  Labs (all labs ordered are listed, but only abnormal results are displayed) Labs Reviewed  BASIC METABOLIC PANEL - Abnormal; Notable for the following components:      Result Value   Glucose, Bld 112 (*)    All other components within normal limits  CBC  TROPONIN I  TSH    EKG EKG Interpretation  Date/Time:  Saturday December 02 2017 13:01:09 EDT Ventricular Rate:  81 PR Interval:    QRS Duration: 83 QT Interval:  369 QTC Calculation: 429 R Axis:   88 Text Interpretation:  Sinus rhythm Probable left atrial enlargement Minimal ST depression, diffuse leads Confirmed by Elnora Morrison [id number]) on 12/02/2017 1:32:01 PM   Radiology No results found.  Procedures Procedures (including critical care time)  Medications Ordered in ED Medications - No data to display   Initial Impression / Assessment and Plan / ED Course  I have reviewed the triage vital signs and the nursing notes.  Pertinent labs & imaging results that were available during my care of the patient were reviewed by me and considered in my medical decision making (see chart for details).    Well-appearing female presents after episodes  of palpitations which have resolved.  EKG reassuring no signs of dangerous arrhythmia, sinus reviewed.  Blood work  reviewed no significant anemia or electrode abnormalities. Troponin neg, no CP today or sob.   Pt no sxs. Stable for outpt fup with cardiology.   Results and differential diagnosis were discussed with the patient/parent/guardian. Xrays were independently reviewed by myself.  Close follow up outpatient was discussed, comfortable with the plan.   Medications - No data to display  Vitals:   12/02/17 1257 12/02/17 1301 12/02/17 1330 12/02/17 1400  BP:  127/82 110/75 111/76  Pulse:  80 79 80  Resp:  20 15 17   Temp:  97.9 F (36.6 C)    TempSrc:  Oral    SpO2:  98% 97% 100%  Weight: 60.8 kg (134 lb)     Height: 5' 0.25" (1.53 m)       Final diagnoses:  Palpitations     Final Clinical Impressions(s) / ED Diagnoses   Final diagnoses:  Palpitations    ED Discharge Orders    None       Elnora Morrison, MD 12/02/17 1541

## 2017-12-02 NOTE — ED Notes (Signed)
Dr Earnest Conroy at [facility]

## 2017-12-02 NOTE — ED Notes (Signed)
Water to room

## 2017-12-04 DIAGNOSIS — M47816 Spondylosis without myelopathy or radiculopathy, lumbar region: Secondary | ICD-10-CM | POA: Diagnosis not present

## 2017-12-06 ENCOUNTER — Telehealth: Payer: Self-pay | Admitting: Family Medicine

## 2017-12-06 NOTE — Telephone Encounter (Signed)
PT is wanting to speak to Roselyn Reef about some issues she had over weekend. Pt didn't want to tell me anything

## 2017-12-06 NOTE — Telephone Encounter (Signed)
Spoke with patient and tried to get her to give me information to relay to either you or Dr. Laurance Flatten.  She would not tell me anything, only that she had some things she needed to speak with you or him about only.

## 2017-12-06 NOTE — Telephone Encounter (Signed)
Went to spine specialist on MON - this was 2 nd visit = 1st injection. Back is better, no help to knees. Just FYI.    Sister found she has bone cancer-  Nephew passed away Family friend passed away - Jul 25, 2022 = felt heart palpitations while at funeral. She pulled over = happened again on the way home  Went to [facility] - all checked out WNL - could have been anxiety.

## 2017-12-15 ENCOUNTER — Other Ambulatory Visit: Payer: Medicare Other

## 2017-12-15 DIAGNOSIS — E875 Hyperkalemia: Secondary | ICD-10-CM | POA: Diagnosis not present

## 2017-12-15 LAB — BMP8+EGFR
BUN/Creatinine Ratio: 10 — ABNORMAL LOW (ref 12–28)
BUN: 9 mg/dL (ref 8–27)
CALCIUM: 9.6 mg/dL (ref 8.7–10.3)
CO2: 27 mmol/L (ref 20–29)
CREATININE: 0.9 mg/dL (ref 0.57–1.00)
Chloride: 103 mmol/L (ref 96–106)
GFR calc Af Amer: 70 mL/min/{1.73_m2} (ref >59)
GFR, EST NON AFRICAN AMERICAN: 61 mL/min/{1.73_m2} (ref >59)
GLUCOSE: 78 mg/dL (ref 65–99)
Potassium: 4.8 mmol/L (ref 3.5–5.2)
SODIUM: 144 mmol/L (ref 134–144)

## 2017-12-18 DIAGNOSIS — M47816 Spondylosis without myelopathy or radiculopathy, lumbar region: Secondary | ICD-10-CM | POA: Diagnosis not present

## 2018-01-04 DIAGNOSIS — Z1231 Encounter for screening mammogram for malignant neoplasm of breast: Secondary | ICD-10-CM | POA: Diagnosis not present

## 2018-01-04 LAB — HM MAMMOGRAPHY

## 2018-01-09 ENCOUNTER — Ambulatory Visit (INDEPENDENT_AMBULATORY_CARE_PROVIDER_SITE_OTHER): Payer: Medicare Other | Admitting: Internal Medicine

## 2018-02-06 DIAGNOSIS — M545 Low back pain: Secondary | ICD-10-CM | POA: Diagnosis not present

## 2018-02-06 DIAGNOSIS — M4696 Unspecified inflammatory spondylopathy, lumbar region: Secondary | ICD-10-CM | POA: Diagnosis not present

## 2018-02-11 ENCOUNTER — Other Ambulatory Visit (INDEPENDENT_AMBULATORY_CARE_PROVIDER_SITE_OTHER): Payer: Self-pay | Admitting: Internal Medicine

## 2018-02-27 ENCOUNTER — Encounter (INDEPENDENT_AMBULATORY_CARE_PROVIDER_SITE_OTHER): Payer: Self-pay | Admitting: Internal Medicine

## 2018-02-27 ENCOUNTER — Ambulatory Visit (INDEPENDENT_AMBULATORY_CARE_PROVIDER_SITE_OTHER): Payer: Medicare Other | Admitting: Internal Medicine

## 2018-02-27 VITALS — BP 110/84 | HR 64 | Temp 98.3°F | Resp 18 | Ht 62.0 in | Wt 136.8 lb

## 2018-02-27 DIAGNOSIS — K5100 Ulcerative (chronic) pancolitis without complications: Secondary | ICD-10-CM

## 2018-02-27 MED ORDER — MESALAMINE 400 MG PO CPDR
800.0000 mg | DELAYED_RELEASE_CAPSULE | Freq: Every day | ORAL | 3 refills | Status: DC
Start: 2018-02-27 — End: 2019-02-13

## 2018-02-27 NOTE — Progress Notes (Signed)
Presenting complaint;  Follow-up for ulcerative colitis.  Subjective:  Patient is 82 year old Caucasian female who has several year history of ulcerative colitis who is here for yearly visit.  She states she is doing well on Asacol.  She is concerned because her co-pay has increased by over $500 for every 3 months.  She has been advised to be switched to another medication.  However she was not given a list of preferred medications. She denies nausea vomiting abdominal pain melena or rectal bleeding.  She generally has 1 formed stool daily.  She feels heartburn is well controlled with PPI and she is not having any dysphagia. She states she had blood work by Dr. Morrie Sheldon about 3 months ago and CBC metabolic 7 was normal.  She also had normal LFTs in July 2019. She states she also had fecal occult blood test and was negative.  Current Medications: Outpatient Encounter Medications as of 02/27/2018  Medication Sig  . acetaminophen (TYLENOL 8 HOUR ARTHRITIS PAIN) 650 MG CR tablet Take 650 mg by mouth every 8 (eight) hours as needed. Patient states that she takes prn for arthritis in her knees.   . ASACOL HD 800 MG TBEC TAKE 2 TABLETS ( 1600 MG ) DAILY  . Bromelains 500 MG TABS Take 500 mg by mouth daily.  . Calcium Carbonate-Vitamin D (CALCIUM 600+D) 600-400 MG-UNIT per tablet Take 1 tablet by mouth every evening.  [name] 500 MG CAPS Take 1 capsule by mouth daily.   . Coenzyme Q10 (CO Q-10 PO) Take 1 tablet by mouth daily. With red yeast rice [employer] Brand  . COLLAGEN PO Take 1 tablet by mouth daily. This is known as 1-2-3  . Cyanocobalamin (VITAMIN B 12 PO) Take 1,000 mcg by mouth daily.   . Ginkgo Biloba 40 MG TABS Take 40 mg by mouth daily.   [name] glucosamine-chondroitin 500-400 MG tablet Take 1 tablet by mouth 2 (two) times daily.    [name] Hyaluronic Acid-Vitamin C (HYALURONIC ACID PO) Take 80 mg by mouth daily.  [name] MILK THISTLE PO Take 100 mg by mouth daily.  . MULTIPLE VITAMINS PO Take  1 tablet by mouth daily. In the mornings  . Nutritional Supplements (GRAPESEED EXTRACT PO) Take 60 mg by mouth daily.   [name] OAT BRAN SOLUBLE PO Take 1 tablet by mouth daily.  [name] OVER THE COUNTER MEDICATION Take 66 mg by mouth daily. Vision Gold Lutein  . OVER THE COUNTER MEDICATION Take 250 mg by mouth daily. Alphalipoic Acid 250 mg daily  . pantoprazole (PROTONIX) 40 MG tablet Take 1 tablet (40 mg total) by mouth daily before breakfast.  . POTASSIUM GLUCONATE PO Take 99 mg by mouth daily. This is Chelated Potassium -Puritans Pride  . pyridOXINE (VITAMIN B-6) 100 MG tablet Take 100 mg by mouth daily.   . Turmeric 500 MG CAPS Take 1 capsule by mouth 3 (three) times daily.   . Vitamins C E (CRANBERRY CONCENTRATE PO) Take 2,500 mg by mouth daily.   . Zinc 50 MG CAPS Take 50 mg by mouth daily.   . [DISCONTINUED] estradiol (ESTRACE) 1 MG tablet Take 1 mg by mouth every other day.   . [DISCONTINUED] traMADol (ULTRAM) 50 MG tablet Take 1 tablet (50 mg total) by mouth every 6 (six) hours as needed. (Patient not taking: Reported on 12/02/2017)   No facility-administered encounter medications on file as of 02/27/2018.      Objective: Blood pressure 110/84, pulse 64, temperature 98.3 F (36.8 C),  temperature source Oral, resp. rate 18, height 5' 2"  (1.575 m), weight 136 lb 12.8 oz (62.1 kg). Patient is alert and in no acute distress. Conjunctiva is pink. Sclera is nonicteric Oropharyngeal mucosa is normal. No neck masses or thyromegaly noted. Cardiac exam with regular rhythm normal S1 and S2. No murmur or gallop noted. Lungs are clear to auscultation. Abdomen is symmetrical soft and nontender with organomegaly or masses. No LE edema or clubbing noted.  Labs/studies Results: Lab data from 12/02/2017 WBC 6.3, H&H 14.2 and 42.3 and platelet count 240 9K.   BUN was 10 and creatinine 0.76.  Assessment:  #1.  Chronic ulcerative colitis.  She is in remission.  Current mesalamine cost has gone up  significantly.  Therefore will switch her to another oral mesalamine which hopefully would be much cheaper. Patient's last surveillance colonoscopy was in March 2017 and she was in endoscopic remission as well.  Plan:  Discontinue Asacol HD. Begin Delzicol 18 mg p.o. Daily. Patient will call with progress report in few weeks. Office visit in 1 year.

## 2018-02-27 NOTE — Patient Instructions (Signed)
Notify if you experience any side effects with Delzicol.

## 2018-03-05 ENCOUNTER — Ambulatory Visit: Payer: Medicare Other | Admitting: Family Medicine

## 2018-03-14 ENCOUNTER — Encounter: Payer: Self-pay | Admitting: Family Medicine

## 2018-03-14 ENCOUNTER — Ambulatory Visit (INDEPENDENT_AMBULATORY_CARE_PROVIDER_SITE_OTHER): Payer: Medicare Other | Admitting: Family Medicine

## 2018-03-14 VITALS — BP 143/72 | HR 59 | Temp 97.5°F | Ht 62.0 in | Wt 139.0 lb

## 2018-03-14 DIAGNOSIS — M5136 Other intervertebral disc degeneration, lumbar region: Secondary | ICD-10-CM | POA: Diagnosis not present

## 2018-03-14 DIAGNOSIS — Z8249 Family history of ischemic heart disease and other diseases of the circulatory system: Secondary | ICD-10-CM | POA: Diagnosis not present

## 2018-03-14 DIAGNOSIS — I10 Essential (primary) hypertension: Secondary | ICD-10-CM

## 2018-03-14 DIAGNOSIS — E559 Vitamin D deficiency, unspecified: Secondary | ICD-10-CM

## 2018-03-14 DIAGNOSIS — R002 Palpitations: Secondary | ICD-10-CM

## 2018-03-14 DIAGNOSIS — R232 Flushing: Secondary | ICD-10-CM

## 2018-03-14 DIAGNOSIS — K5100 Ulcerative (chronic) pancolitis without complications: Secondary | ICD-10-CM | POA: Diagnosis not present

## 2018-03-14 NOTE — Patient Instructions (Addendum)
Medicare Annual Wellness Visit  [facility] and the medical providers at [facility] strive to bring you the best medical care.  In doing so we not only want to address your current medical conditions and concerns but also to detect new conditions early and prevent illness, disease and health-related problems.    Medicare offers a yearly Wellness Visit which allows our clinical staff to assess your need for preventative services including immunizations, lifestyle education, counseling to decrease risk of preventable diseases and screening for fall risk and other medical concerns.    This visit is provided free of charge (no copay) for all Medicare recipients. The clinical pharmacists at [facility] have begun to conduct these Wellness Visits which will also include a thorough review of all your medications.    As you primary medical provider recommend that you make an appointment for your Annual Wellness Visit if you have not done so already this year.  You may set up this appointment before you leave today or you may call back ([phone]) and schedule an appointment.  Please make sure when you call that you mention that you are scheduling your Annual Wellness Visit with the clinical pharmacist so that the appointment may be made for the proper length of time.     Continue current medications. Continue good therapeutic lifestyle changes which include good diet and exercise. Fall precautions discussed with patient. If an FOBT was given today- please return it to our front desk. If you are over 72 years old - you may need Prevnar 58 or the adult Pneumonia vaccine.  **Flu shots are available--- please call and schedule a FLU-CLINIC appointment**  After your visit with [place] today you will receive a survey in the mail or online from [employer] regarding your care with [place]. Please take a moment to fill this out. Your feedback is very  important to [place] as you can help [place] better understand your patient needs as well as improve your experience and satisfaction. WE CARE ABOUT YOU!!!   Decrease caffeine as much as possible We will arrange for you to see the cardiologist again and response to the episode that happened this summer and he may want to consider giving you a pill in the pocket for episodes of palpitation. Follow-up with gastroenterology as needed Recognize stressful situations and try to get more rest during those stressful times. Try black cohosh and see if this helps the hot flashes.  If it does not after 3 to 4 weeks get back in touch with [place]. Continue to follow-up with back surgeon regarding back pain

## 2018-03-14 NOTE — Progress Notes (Signed)
Subjective:    Patient ID: Lori Soto, female    DOB: 04-22-1936, 82 y.o.   MRN: [id number]  HPI Pt here for follow up and management of chronic medical problems which includes hypertension. She is taking medication regularly.  The patient is doing well overall but does complain of hot flashes and one more episode of palpitations.  She did go to the hospital have this evaluated.  She will get lab work today.  The EKG done there was reassuring of no signs of any dangerous arrhythmia.  Troponin was negative and the patient was not having any chest pain or shortness of breath.  She is also subsequently seen the gastroenterologist because of her ulcerated pancolitis.  She is seen visits to the orthopedic surgeon about her carpal tunnel syndrome.  The patient today denies any chest pain pressure tightness or shortness of breath.  She denies any trouble with swallowing heartburn indigestion nausea vomiting or blood in the stool.  She did have a recent work-up in the hospital for the palpitations and the EKG and troponin levels were negative.  She is recently seen the gastroenterologist and he plans to see her back again in about a year for her colitis issues.  She is having problems with insurance paying for medications that she is been on for years.  He is passing her water without problems.     Patient Active Problem List   Diagnosis Date Noted  . Osteopenia of the elderly 02/19/2014  . DDD (degenerative disc disease), lumbar 12/03/2013  . Scoliosis 12/03/2013  . Chronic LBP 05/14/2013  . IBS (irritable bowel syndrome) 11/08/2012  . UC (ulcerative colitis) ([place]) 04/23/2012  . Palpitation 10/26/2011  . Hypertension 10/26/2011  . Chronic ulcerative colitis ([place]) 03/23/2011   Outpatient Encounter Medications as of 03/14/2018  Medication Sig  . acetaminophen (TYLENOL 8 HOUR ARTHRITIS PAIN) 650 MG CR tablet Take 650 mg by mouth every 8 (eight) hours as needed. Patient states that she takes  prn for arthritis in her knees.   . Bromelains 500 MG TABS Take 500 mg by mouth daily.  . Calcium Carbonate-Vitamin D (CALCIUM 600+D) 600-400 MG-UNIT per tablet Take 1 tablet by mouth every evening.  [name] 500 MG CAPS Take 1 capsule by mouth daily.   . Coenzyme Q10 (CO Q-10 PO) Take 1 tablet by mouth daily. With red yeast rice [employer]  . COLLAGEN PO Take 1 tablet by mouth daily. This is known as 1-2-3  . Cyanocobalamin (VITAMIN B 12 PO) Take 1,000 mcg by mouth daily.   . Ginkgo Biloba 40 MG TABS Take 40 mg by mouth daily.   [name] glucosamine-chondroitin 500-400 MG tablet Take 1 tablet by mouth 2 (two) times daily.    [name] Hyaluronic Acid-Vitamin C (HYALURONIC ACID PO) Take 80 mg by mouth daily.  . Mesalamine (ASACOL) 400 MG CPDR DR capsule Take 2 capsules (800 mg total) by mouth daily.  [name] MILK THISTLE PO Take 100 mg by mouth daily.  . MULTIPLE VITAMINS PO Take 1 tablet by mouth daily. In the mornings  . Nutritional Supplements (GRAPESEED EXTRACT PO) Take 60 mg by mouth daily.   [name] OAT BRAN SOLUBLE PO Take 1 tablet by mouth daily.  [name] OVER THE COUNTER MEDICATION Take 66 mg by mouth daily. Vision Gold Lutein  . OVER THE COUNTER MEDICATION Take 250 mg by mouth daily. Alphalipoic Acid 250 mg daily  . pantoprazole (PROTONIX) 40 MG tablet Take 1 tablet (40 mg total)  by mouth daily before breakfast.  . POTASSIUM GLUCONATE PO Take 99 mg by mouth daily. This is Chelated Potassium -Puritans Pride  . pyridOXINE (VITAMIN B-6) 100 MG tablet Take 100 mg by mouth daily.   . Turmeric 500 MG CAPS Take 1 capsule by mouth 3 (three) times daily.   . Vitamins C E (CRANBERRY CONCENTRATE PO) Take 2,500 mg by mouth daily.   . Zinc 50 MG CAPS Take 50 mg by mouth daily.    No facility-administered encounter medications on file as of 03/14/2018.      Review of Systems  Constitutional: Negative.   HENT: Negative.   Eyes: Negative.   Respiratory: Negative.   Cardiovascular: Positive for palpitations  (episode in Aug 2019 - went to ER ).  Gastrointestinal: Negative.   Endocrine: Negative.        Having "hot flashes" at times   Genitourinary: Negative.   Musculoskeletal: Negative.   Skin: Negative.   Allergic/Immunologic: Negative.   Neurological: Negative.   Hematological: Negative.   Psychiatric/Behavioral: Negative.        Objective:   Physical Exam  Constitutional: She is oriented to person, place, and time. She appears well-developed and well-nourished. No distress.  The patient is pleasant looks great and feels great.  She has been under a lot of stress and strain especially with several deaths in her family and this is when she had the episode of palpitations which she has not had since.  HENT:  Head: Normocephalic and atraumatic.  Right Ear: External ear normal.  Left Ear: External ear normal.  Nose: Nose normal.  Mouth/Throat: Oropharynx is clear and moist.  Eyes: Pupils are equal, round, and reactive to light. Conjunctivae and EOM are normal. Right eye exhibits no discharge. Left eye exhibits no discharge. No scleral icterus.  Neck: Normal range of motion. Neck supple. No thyromegaly present.  No bruits thyromegaly or anterior cervical adenopathy  Cardiovascular: Normal rate, regular rhythm, normal heart sounds and intact distal pulses.  No murmur heard. The heart is regular today at 72/min with good pedal pulses and no edema  Pulmonary/Chest: Effort normal and breath sounds normal. She has no wheezes. She has no rales.  Clear anteriorly and posteriorly  Abdominal: Soft. Bowel sounds are normal. She exhibits no mass. There is no tenderness. There is no rebound.  No abdominal tenderness organ enlargement bruits.  No masses.  Musculoskeletal: Normal range of motion. She exhibits tenderness and deformity. She exhibits no edema.  Severe kyphoscoliosis with low back pain associated with this.  Lymphadenopathy:    She has no cervical adenopathy.  Neurological: She is alert  and oriented to person, place, and time. She has normal reflexes. No cranial nerve deficit.  Skin: Skin is warm and dry. No rash noted.  Psychiatric: She has a normal mood and affect. Her behavior is normal. Judgment and thought content normal.  The patient's mood affect and behavior are all normal for her.  She has a positive outlook and keeps her regular visits with her doctors.  Nursing note and vitals reviewed.   BP (!) 143/72 (BP Location: Left Arm)   Pulse (!) 59   Temp (!) 97.5 F (36.4 C) (Oral)   Ht 5' 2" (1.575 m)   Wt 139 lb (63 kg)   BMI 25.42 kg/m        Assessment & Plan:  1. Essential hypertension -The patient is doing well today and her blood pressure is slightly elevated this morning. - BMP8+EGFR - CBC  with Differential/Platelet - Hepatic function panel  2. Vitamin D deficiency -Continue with vitamin D replacement pending results of lab work - CBC with Differential/Platelet - VITAMIN D 25 Hydroxy (Vit-D Deficiency, Fractures)  3. Family history of heart disease -Follow-up with cardiology as planned especially in light of recent bouts of palpitations. - CBC with Differential/Platelet - Lipid panel  4. Palpitations -Schedule visit with cardiology for follow-up - BMP8+EGFR - CBC with Differential/Platelet  5. Hot flashes -Try black cohosh.  Patient has had according to her hysterectomy and oophorectomy.  She will get back in touch with [place] after 3 to 4 weeks if the hot flashes continue.  6. DDD (degenerative disc disease), lumbar -Follow-up with surgical specialist as planned  7. Ulcerative pancolitis without complication [facility]) -Follow-up with gastroenterology as planned  Patient Instructions                       Medicare Annual Wellness Visit  [facility] and the medical providers at [facility] strive to bring you the best medical care.  In doing so we not only want to address your current medical conditions and concerns but  also to detect new conditions early and prevent illness, disease and health-related problems.    Medicare offers a yearly Wellness Visit which allows our clinical staff to assess your need for preventative services including immunizations, lifestyle education, counseling to decrease risk of preventable diseases and screening for fall risk and other medical concerns.    This visit is provided free of charge (no copay) for all Medicare recipients. The clinical pharmacists at [facility] have begun to conduct these Wellness Visits which will also include a thorough review of all your medications.    As you primary medical provider recommend that you make an appointment for your Annual Wellness Visit if you have not done so already this year.  You may set up this appointment before you leave today or you may call back ([phone]) and schedule an appointment.  Please make sure when you call that you mention that you are scheduling your Annual Wellness Visit with the clinical pharmacist so that the appointment may be made for the proper length of time.     Continue current medications. Continue good therapeutic lifestyle changes which include good diet and exercise. Fall precautions discussed with patient. If an FOBT was given today- please return it to our front desk. If you are over 72 years old - you may need Prevnar 47 or the adult Pneumonia vaccine.  **Flu shots are available--- please call and schedule a FLU-CLINIC appointment**  After your visit with [place] today you will receive a survey in the mail or online from [employer] regarding your care with [place]. Please take a moment to fill this out. Your feedback is very important to [place] as you can help [place] better understand your patient needs as well as improve your experience and satisfaction. WE CARE ABOUT YOU!!!   Decrease caffeine as much as possible We will arrange for you to see the cardiologist again and response to the episode  that happened this summer and he may want to consider giving you a pill in the pocket for episodes of palpitation. Follow-up with gastroenterology as needed Recognize stressful situations and try to get more rest during those stressful times. Try black cohosh and see if this helps the hot flashes.  If it does not after 3 to 4 weeks get back in touch with  [place]. Continue to follow-up with back surgeon regarding back pain  Arrie Senate MD

## 2018-03-15 LAB — HEPATIC FUNCTION PANEL
ALBUMIN: 4.5 g/dL (ref 3.5–4.7)
ALT: 17 IU/L (ref 0–32)
AST: 22 IU/L (ref 0–40)
Alkaline Phosphatase: 37 IU/L — ABNORMAL LOW (ref 39–117)
Bilirubin Total: 0.4 mg/dL (ref 0.0–1.2)
Bilirubin, Direct: 0.15 mg/dL (ref 0.00–0.40)
Total Protein: 7 g/dL (ref 6.0–8.5)

## 2018-03-15 LAB — BMP8+EGFR
BUN / CREAT RATIO: 10 — AB (ref 12–28)
BUN: 7 mg/dL — AB (ref 8–27)
CO2: 27 mmol/L (ref 20–29)
CREATININE: 0.67 mg/dL (ref 0.57–1.00)
Calcium: 9.8 mg/dL (ref 8.7–10.3)
Chloride: 103 mmol/L (ref 96–106)
GFR calc non Af Amer: 83 mL/min/{1.73_m2} (ref >59)
GFR, EST AFRICAN AMERICAN: 96 mL/min/{1.73_m2} (ref >59)
Glucose: 93 mg/dL (ref 65–99)
Potassium: 5.3 mmol/L — ABNORMAL HIGH (ref 3.5–5.2)
Sodium: 144 mmol/L (ref 134–144)

## 2018-03-15 LAB — LIPID PANEL
CHOL/HDL RATIO: 3 ratio (ref 0.0–4.4)
Cholesterol, Total: 203 mg/dL — ABNORMAL HIGH (ref 100–199)
HDL: 68 mg/dL (ref >39)
LDL Calculated: 115 mg/dL — ABNORMAL HIGH (ref 0–99)
TRIGLYCERIDES: 101 mg/dL (ref 0–149)
VLDL CHOLESTEROL CAL: 20 mg/dL (ref 5–40)

## 2018-03-15 LAB — CBC WITH DIFFERENTIAL/PLATELET
Basophils Absolute: 0 10*3/uL (ref 0.0–0.2)
Basos: 1 %
EOS (ABSOLUTE): 0 10*3/uL (ref 0.0–0.4)
Eos: 1 %
HEMATOCRIT: 40.2 % (ref 34.0–46.6)
HEMOGLOBIN: 13.3 g/dL (ref 11.1–15.9)
IMMATURE GRANS (ABS): 0 10*3/uL (ref 0.0–0.1)
Immature Granulocytes: 0 %
LYMPHS ABS: 1.6 10*3/uL (ref 0.7–3.1)
Lymphs: 31 %
MCH: 29.8 pg (ref 26.6–33.0)
MCHC: 33.1 g/dL (ref 31.5–35.7)
MCV: 90 fL (ref 79–97)
MONOCYTES: 9 %
Monocytes Absolute: 0.5 10*3/uL (ref 0.1–0.9)
NEUTROS ABS: 3 10*3/uL (ref 1.4–7.0)
Neutrophils: 58 %
Platelets: 240 10*3/uL (ref 150–450)
RBC: 4.46 x10E6/uL (ref 3.77–5.28)
RDW: 12.7 % (ref 12.3–15.4)
WBC: 5.1 10*3/uL (ref 3.4–10.8)

## 2018-03-15 LAB — VITAMIN D 25 HYDROXY (VIT D DEFICIENCY, FRACTURES): Vit D, 25-Hydroxy: 42.1 ng/mL (ref 30.0–100.0)

## 2018-04-09 DIAGNOSIS — M47816 Spondylosis without myelopathy or radiculopathy, lumbar region: Secondary | ICD-10-CM | POA: Diagnosis not present

## 2018-04-11 ENCOUNTER — Other Ambulatory Visit: Payer: Medicare Other

## 2018-04-11 DIAGNOSIS — E875 Hyperkalemia: Secondary | ICD-10-CM

## 2018-04-12 LAB — BMP8+EGFR
BUN / CREAT RATIO: 18 (ref 12–28)
BUN: 15 mg/dL (ref 8–27)
CO2: 27 mmol/L (ref 20–29)
CREATININE: 0.82 mg/dL (ref 0.57–1.00)
Calcium: 9.5 mg/dL (ref 8.7–10.3)
Chloride: 102 mmol/L (ref 96–106)
GFR calc Af Amer: 78 mL/min/{1.73_m2} (ref >59)
GFR, EST NON AFRICAN AMERICAN: 68 mL/min/{1.73_m2} (ref >59)
Glucose: 87 mg/dL (ref 65–99)
Potassium: 4.3 mmol/L (ref 3.5–5.2)
Sodium: 141 mmol/L (ref 134–144)

## 2018-04-30 DIAGNOSIS — M47816 Spondylosis without myelopathy or radiculopathy, lumbar region: Secondary | ICD-10-CM | POA: Diagnosis not present

## 2018-05-07 NOTE — Progress Notes (Signed)
Cardiology Office Note   Date:  05/09/2018   ID:  Nakyra Bourn, DOB 12/16/1935, MRN [id number]  PCP:  Chipper Herb, MD  Cardiologist:   Minus Breeding, MD  Referring:  Chipper Herb, MD  Chief Complaint  Patient presents with  . Dizziness      History of Present Illness: Lori Soto is a 83 y.o. female who presents for evaluation of palpitations.   I saw her for evaluation of palpitations in 2014.  She has a heart murmur and an echo in 2011 was normal.  Since I last saw her she was in the ED in August.  I reviewed these records for this visit.   There were no abnormalities noted at that presentation.   Since I last saw her.  She continues to get episodes of hot flashes.  She says this is similar to the event that she had in August.  At that time she was at a funeral and there was a lot of stress around that and another death in her family.  She was standing and felt lightheaded and presyncopal and hot.  She sat down and it took quite a while to pass.  She was essentially resolved by the time she went to the emergency room.  I do note that her blood pressure was slightly low in the 128N systolic.  She is had other episodes of feeling hot but she does not notice her blood pressure to be particularly notable.  She is not describing tachypalpitations.  She is not sure how often this is happening.  She is not had any frank syncope or syncope.  She has no new shortness of breath, PND or orthopnea.  Past Medical History:  Diagnosis Date  . Anemia   . Arthritis   . Cancer ([place])    skin cancer on nose  . Carpal tunnel syndrome, bilateral   . Cataract   . Esophagitis   . GERD (gastroesophageal reflux disease)   . H/O hiatal hernia   . Osteopenia   . Scoliosis   . Shingles   . Ulcerative colitis     Past Surgical History:  Procedure Laterality Date  . ABDOMINAL HYSTERECTOMY    . BUNIONECTOMY     Bilateral  . CARPAL TUNNEL RELEASE Left 03/10/2015   Procedure: LEFT  CARPAL TUNNEL RELEASE;  Surgeon: Daryll Brod, MD;  Location: [facility];  Service: Orthopedics;  Laterality: Left;  . CARPAL TUNNEL RELEASE Right 10/31/2017   Procedure: RIGHT CARPAL TUNNEL RELEASE;  Surgeon: Daryll Brod, MD;  Location: [facility];  Service: Orthopedics;  Laterality: Right;  . CATARACT EXTRACTION W/PHACO  05/28/2012   Procedure: CATARACT EXTRACTION PHACO AND INTRAOCULAR LENS PLACEMENT (IOC);  Surgeon: Tonny Branch, MD;  Location: [facility];  Service: Ophthalmology;  Laterality: Right;  CDE=12.84  . CATARACT EXTRACTION W/PHACO  06/07/2012   Procedure: CATARACT EXTRACTION PHACO AND INTRAOCULAR LENS PLACEMENT (IOC);  Surgeon: Tonny Branch, MD;  Location: [facility];  Service: Ophthalmology;  Laterality: Left;  CDE 17.60  . COLONOSCOPY    . COLONOSCOPY N/A 07/08/2015   Procedure: COLONOSCOPY;  Surgeon: Rogene [place], MD;  [facility];  Service: Endoscopy;  Laterality: N/A;  930  . ESOPHAGEAL DILATION N/A 07/08/2015   Procedure: ESOPHAGEAL DILATION;  Surgeon: Rogene [place], MD;  [facility];  Service: Endoscopy;  Laterality: N/A;  . ESOPHAGOGASTRODUODENOSCOPY N/A 07/08/2015   Procedure: ESOPHAGOGASTRODUODENOSCOPY (EGD);  Surgeon: Rogene [place], MD;  [facility];  Service: Endoscopy;  Laterality: N/A;  . Hernia     Right inguinal  . HERNIA REPAIR  1961   right inguinal hernia  . UPPER GASTROINTESTINAL ENDOSCOPY       Current Outpatient Medications  Medication Sig Dispense Refill  . acetaminophen (TYLENOL 8 HOUR ARTHRITIS PAIN) 650 MG CR tablet Take 650 mg by mouth every 8 (eight) hours as needed. Patient states that she takes prn for arthritis in her knees.     . Bromelains 500 MG TABS Take 500 mg by mouth daily.    . Calcium Carbonate-Vitamin D (CALCIUM 600+D) 600-400 MG-UNIT per tablet Take 1 tablet by mouth every evening.    [name] 500 MG CAPS Take 1 capsule by mouth daily.     . Coenzyme Q10 (CO Q-10 PO) Take 1 tablet by  mouth daily. With red yeast rice [employer] Brand    . COLLAGEN PO Take 1 tablet by mouth daily. This is known as 1-2-3    . Cyanocobalamin (VITAMIN B 12 PO) Take 1,000 mcg by mouth daily.     . Ginkgo Biloba 40 MG TABS Take 40 mg by mouth daily.     [name] glucosamine-chondroitin 500-400 MG tablet Take 1 tablet by mouth 2 (two) times daily.      [name] Hyaluronic Acid-Vitamin C (HYALURONIC ACID PO) Take 80 mg by mouth daily.    . Mesalamine (ASACOL) 400 MG CPDR DR capsule Take 2 capsules (800 mg total) by mouth daily. 180 capsule 3  . MILK THISTLE PO Take 100 mg by mouth daily.    . MULTIPLE VITAMINS PO Take 1 tablet by mouth daily. In the mornings    . Nutritional Supplements (GRAPESEED EXTRACT PO) Take 60 mg by mouth daily.     [name] OAT BRAN SOLUBLE PO Take 1 tablet by mouth daily.    [name] OVER THE COUNTER MEDICATION Take 66 mg by mouth daily. Vision Gold Lutein    . OVER THE COUNTER MEDICATION Take 250 mg by mouth daily. Alphalipoic Acid 250 mg daily    . pantoprazole (PROTONIX) 40 MG tablet Take 40 mg by mouth daily as needed.    [name] POTASSIUM GLUCONATE PO Take 99 mg by mouth daily. This is Chelated Potassium -[employer] Pride    . pyridOXINE (VITAMIN B-6) 100 MG tablet Take 100 mg by mouth daily.     . Turmeric 500 MG CAPS Take 1 capsule by mouth 3 (three) times daily.     . Vitamins C E (CRANBERRY CONCENTRATE PO) Take 2,500 mg by mouth daily.     . Zinc 50 MG CAPS Take 50 mg by mouth daily.      No current facility-administered medications for this visit.     Allergies:   Penicillins; Tape; and Influenza virus vacc split pf    ROS:  Please see the history of present illness.   Otherwise, review of systems are positive for none.   All other systems are reviewed and negative.    PHYSICAL EXAM: VS:  BP 130/84   Pulse 80   Ht 5' 0.25" (1.53 m)   Wt 135 lb (61.2 kg)   BMI 26.15 kg/m  , BMI Body mass index is 26.15 kg/m. GENERAL:  Well appearing NECK:  No jugular venous distention, waveform  within normal limits, carotid upstroke brisk and symmetric, no bruits, no thyromegaly LUNGS:  Clear to auscultation bilaterally BACK:  No CVA tenderness CHEST:  Unremarkable HEART:  PMI not displaced  or sustained,S1 and S2 within normal limits, no S3, no S4, no clicks, no rubs, no murmurs ABD:  Flat, positive bowel sounds normal in frequency in pitch, no bruits, no rebound, no guarding, no midline pulsatile mass, no hepatomegaly, no splenomegaly EXT:  2 plus pulses throughout, no edema, no cyanosis no clubbing   EKG:  EKG is  ordered today. The ekg ordered today demonstrates sinus rhythm, rate 65, rightward axis, intervals within. Poor anterior R wave progression  Recent Labs: 12/02/2017: TSH 1.357 03/14/2018: ALT 17; Hemoglobin 13.3; Platelets 240 04/11/2018: BUN 15; Creatinine, Ser 0.82; Potassium 4.3; Sodium 141    Lipid Panel    Component Value Date/Time   CHOL 203 (H) 03/14/2018 1147   TRIG 101 03/14/2018 1147   TRIG 79 12/18/2015 1105   HDL 68 03/14/2018 1147   HDL 65 12/18/2015 1105   CHOLHDL 3.0 03/14/2018 1147   LDLCALC 115 (H) 03/14/2018 1147   LDLCALC 109 (H) 11/29/2013 0822      Wt Readings from Last 3 Encounters:  05/09/18 135 lb (61.2 kg)  03/14/18 139 lb (63 kg)  02/27/18 136 lb 12.8 oz (62.1 kg)      Other studies Reviewed: Additional studies/ records that were reviewed today include: ED records. Review of the above records demonstrates:     ASSESSMENT AND PLAN:  PALPITATIONS:    She is not sure that she is feeling palpitations with these events and she is not sure how frequently they happen.  At this point I suspect they are vagal.  She was not orthostatic in the office.  I will check an event monitor if she thinks these are happening with enough frequency to justify that.  She is not sure what they are happening at least monthly.    PRESYNCOPE: Again I do not have an etiology for these identified.  Of asked her to call 911 if she is having a significant  episode so we can get heart rate blood pressure and blood sugar checked.  Otherwise I will monitor this as above if she calls me back and says she wants to wear an event monitor.   Current medicines are reviewed at length with the patient today.  The patient does not have concerns regarding medicines.  The following changes have been made:  None  Labs/ tests ordered today include: None  No orders of the defined types were placed in this encounter.    Disposition:   FU with me in six months.    Signed, Minus Breeding, MD  05/09/2018 12:28 PM    [facility]

## 2018-05-09 ENCOUNTER — Encounter: Payer: Self-pay | Admitting: Cardiology

## 2018-05-09 ENCOUNTER — Ambulatory Visit (INDEPENDENT_AMBULATORY_CARE_PROVIDER_SITE_OTHER): Payer: Medicare Other | Admitting: Cardiology

## 2018-05-09 VITALS — BP 130/84 | HR 80 | Ht 60.25 in | Wt 135.0 lb

## 2018-05-09 DIAGNOSIS — R42 Dizziness and giddiness: Secondary | ICD-10-CM | POA: Diagnosis not present

## 2018-05-09 DIAGNOSIS — M180 Bilateral primary osteoarthritis of first carpometacarpal joints: Secondary | ICD-10-CM | POA: Diagnosis not present

## 2018-05-09 DIAGNOSIS — R002 Palpitations: Secondary | ICD-10-CM | POA: Diagnosis not present

## 2018-05-09 NOTE — Patient Instructions (Signed)
Medication Instructions:  The current medical regimen is effective;  continue present plan and medications.  If you need a refill on your cardiac medications before your next appointment, please call your pharmacy.   Follow-Up: Follow up in 6 months with Dr. Percival Spanish.  You will receive a letter in the mail 2 months before you are due.  Please call us when you receive this letter to schedule your follow up appointment.  Thank you for choosing [facility]!!

## 2018-05-21 NOTE — Addendum Note (Signed)
Addended by: Shellia Cleverly on: 05/21/2018 09:12 AM   Modules accepted: Orders

## 2018-05-29 DIAGNOSIS — M47816 Spondylosis without myelopathy or radiculopathy, lumbar region: Secondary | ICD-10-CM | POA: Diagnosis not present

## 2018-05-29 DIAGNOSIS — M4186 Other forms of scoliosis, lumbar region: Secondary | ICD-10-CM | POA: Diagnosis not present

## 2018-06-26 ENCOUNTER — Other Ambulatory Visit: Payer: Self-pay

## 2018-06-26 ENCOUNTER — Ambulatory Visit: Payer: Medicare Other | Attending: Orthopaedic Surgery | Admitting: Physical Therapy

## 2018-06-26 ENCOUNTER — Encounter: Payer: Self-pay | Admitting: Physical Therapy

## 2018-06-26 DIAGNOSIS — G8929 Other chronic pain: Secondary | ICD-10-CM | POA: Diagnosis not present

## 2018-06-26 DIAGNOSIS — R293 Abnormal posture: Secondary | ICD-10-CM | POA: Insufficient documentation

## 2018-06-26 DIAGNOSIS — M546 Pain in thoracic spine: Secondary | ICD-10-CM | POA: Insufficient documentation

## 2018-06-26 DIAGNOSIS — M6281 Muscle weakness (generalized): Secondary | ICD-10-CM | POA: Diagnosis not present

## 2018-06-26 DIAGNOSIS — M545 Low back pain: Secondary | ICD-10-CM | POA: Insufficient documentation

## 2018-06-26 NOTE — Addendum Note (Signed)
Addended by: Gabriela Eves on: 06/26/2018 09:49 PM   Modules accepted: Orders

## 2018-06-26 NOTE — Therapy (Signed)
[facility] [place], [place], [address] Phone: [phone]   Fax:  [phone]  Physical Therapy Evaluation  Patient Details  Name: Lori Soto MRN: [id number] Date of Birth: [birth year] Referring Provider (PT): Beau Fanny, [place]   Encounter Date: 06/26/2018  PT End of Session - 06/26/18 1330    Visit Number  1    Number of Visits  12    Date for PT Re-Evaluation  08/14/18    PT Start Time  1116    PT Stop Time  1206    PT Time Calculation (min)  50 min    Activity Tolerance  Patient tolerated treatment well    Behavior During Therapy  [facility] for tasks assessed/performed       Past Medical History:  Diagnosis Date  . Anemia   . Arthritis   . Cancer ([place])    skin cancer on nose  . Carpal tunnel syndrome, bilateral   . Cataract   . Esophagitis   . GERD (gastroesophageal reflux disease)   . H/O hiatal hernia   . Osteopenia   . Scoliosis   . Shingles   . Ulcerative colitis     Past Surgical History:  Procedure Laterality Date  . ABDOMINAL HYSTERECTOMY    . BUNIONECTOMY     Bilateral  . CARPAL TUNNEL RELEASE Left 03/10/2015   Procedure: LEFT CARPAL TUNNEL RELEASE;  Surgeon: Daryll Brod, MD;  Location: [place];  Service: Orthopedics;  Laterality: Left;  . CARPAL TUNNEL RELEASE Right 10/31/2017   Procedure: RIGHT CARPAL TUNNEL RELEASE;  Surgeon: Daryll Brod, MD;  Location: [place];  Service: Orthopedics;  Laterality: Right;  . CATARACT EXTRACTION W/PHACO  05/28/2012   Procedure: CATARACT EXTRACTION PHACO AND INTRAOCULAR LENS PLACEMENT (IOC);  Surgeon: Tonny Branch, MD;  [facility];  Service: Ophthalmology;  Laterality: Right;  CDE=12.84  . CATARACT EXTRACTION W/PHACO  06/07/2012   Procedure: CATARACT EXTRACTION PHACO AND INTRAOCULAR LENS PLACEMENT (IOC);  Surgeon: Tonny Branch, MD;  [facility];  Service: Ophthalmology;  Laterality: Left;  CDE 17.60  . COLONOSCOPY    .  COLONOSCOPY N/A 07/08/2015   Procedure: COLONOSCOPY;  Surgeon: Rogene Houston, MD;  [facility];  Service: Endoscopy;  Laterality: N/A;  930  . ESOPHAGEAL DILATION N/A 07/08/2015   Procedure: ESOPHAGEAL DILATION;  Surgeon: Rogene Houston, MD;  [facility];  Service: Endoscopy;  Laterality: N/A;  . ESOPHAGOGASTRODUODENOSCOPY N/A 07/08/2015   Procedure: ESOPHAGOGASTRODUODENOSCOPY (EGD);  Surgeon: Rogene Houston, MD;  [facility];  Service: Endoscopy;  Laterality: N/A;  . Hernia     Right inguinal  . HERNIA REPAIR  1961   right inguinal hernia  . UPPER GASTROINTESTINAL ENDOSCOPY      There were no vitals filed for this visit.   Subjective Assessment - 06/26/18 2120    Subjective  Patient arrives to physical therapy with reports of low back pain that has been causing difficulties while performing ADLs. Patient states pain is chronic but recently had a "flare up" where her right side of her back was painful with severe pain.  She reported getting an injection "in her nerve" to which has helped reduce her pain but has not alleviated it. Patient reports pain at worst is 10/10 and describes pain as burning stinging and sharp. Patient reports pain at best is 1-2/10. Patient's goals are to decrease pain, improve mobility, and improve ability to perform home tasks.  Pertinent History  Scoliosis, hiatial hernia, osteopenia, allergy to tape    Limitations  Sitting;House hold activities;Standing;Walking    How long can you stand comfortably?  20 minutes    How long can you walk comfortably?  20-30 minutes max    Diagnostic tests  x-ray    Patient Stated Goals  improve ability to perform home activities with less pain    Currently in Pain?  Yes    Pain Score  5     Pain Location  Back    Pain Orientation  Lower    Pain Descriptors / Indicators  Shooting;Sharp;Aching;Constant    Pain Type  Chronic pain    Pain Onset  More than a month ago    Pain Frequency  Constant     Aggravating Factors   increased movement    Pain Relieving Factors  rest, tylenol PRN    Effect of Pain on Daily Activities  difficulties with ADLs         [facility] PT Assessment - 06/26/18 0001      Assessment   Medical Diagnosis  spondylosis without myelopathy or radiculopathy    Referring Provider (PT)  Beau Fanny, PA-C    Onset Date/Surgical Date  --   chronic, ongoing   Next MD Visit  End of March    Prior Therapy  yes      Precautions   Precautions  None      Restrictions   Weight Bearing Restrictions  No      Balance Screen   Has the patient fallen in the past 6 months  No    Has the patient had a decrease in activity level because of a fear of falling?   No    Is the patient reluctant to leave their home because of a fear of falling?   No      Home Environment   Living Environment  Private residence    Living Arrangements  Alone    Home Layout  Multi-level      Prior Function   Level of Independence  Independent      Posture/Postural Control   Posture/Postural Control  Postural limitations    Postural Limitations  Rounded Shoulders;Forward head;Increased thoracic kyphosis;Flexed trunk   significant scoliosis     ROM / Strength   AROM / PROM / Strength  AROM;Strength      AROM   Overall AROM   Deficits    AROM Assessment Site  Lumbar    Lumbar Flexion  Finger tips to floor    Lumbar - Right Side Bend  21.5" finger tip to floor    Lumbar - Left Side Bend  19.5" finger tip to floor      Strength   Strength Assessment Site  Hip;Knee    Right/Left Hip  Right;Left    Right Hip Flexion  3+/5    Right Hip Extension  3+/5    Right Hip ABduction  3+/5    Left Hip Flexion  3+/5    Left Hip Extension  4-/5    Left Hip ABduction  4-/5    Right/Left Knee  Right;Left    Right Knee Flexion  4/5    Right Knee Extension  4/5    Left Knee Flexion  4/5    Left Knee Extension  4/5      Palpation   Palpation comment  Tenderness to palpation to right apex of  curvature in thoracolumbar spine, increased tenderness to right glute,  Objective measurements completed on examination: See above findings.              PT Education - 06/26/18 1331    Education Details  draw ins, lower trunk rotation, double knee to chest    Person(s) Educated  Patient    Methods  Explanation;Demonstration;Handout    Comprehension  Verbalized understanding;Returned demonstration          PT Long Term Goals - 06/26/18 1333      PT LONG TERM GOAL #1   Title  Patient will be independent with HEP    Baseline       Time  6    Period  Weeks    Status  New      PT LONG TERM GOAL #2   Title  Patient will report ability to perfrom ADLs with low back pain less than or equal to 4/10    Baseline       Time  6    Period  Weeks    Status  New      PT LONG TERM GOAL #3   Title  Patient will demonstrate 4/5 or greater bilateral LE MMT to improve stability during functional tasks.    Baseline       Time  6      PT LONG TERM GOAL #4   Title  Patient will report ability to stand for 20 minutes or greater with low back pain less than or equal to 5/10 to improve ability to perform home tasks.     Time  6    Period  Weeks    Status  New             Plan - 06/26/18 2123    Clinical Impression Statement  Patient is an 83 year old female who presents to physical therapy with low back pain, bilateral LE weakness, decreased thoracolumbar ROM, and tenderness to palpation to R QL. Patient noted with significant scoliosis and kyphosis and noted with tenderness especially to the right apex of thoracolumbar curve. Patient ambulates with a lateral trunk lean to the left, decreased stride length, and with flexed trunk. Patient would benefit from skilled physical therapy to address deficits and address patient's goals.     History and Personal Factors relevant to plan of care:  scoliosis, hiatial hernia, osteopenia    Clinical Presentation   Evolving    Clinical Decision Making  Low    Rehab Potential  Good    PT Frequency  2x / week    PT Duration  6 weeks    PT Treatment/Interventions  Electrical Stimulation;ADLs/Self Care Home Management;Cryotherapy;Traction;Ultrasound;Moist Heat;Iontophoresis 31m/ml Dexamethasone;Gait training;Stair training;Manual techniques;Dry needling;Passive range of motion;Balance training;Neuromuscular re-education;Therapeutic exercise;Therapeutic activities;Patient/family education    PT Next Visit Plan  nustep (if tolerable for knees) lumbar traction, modalities for pain relief.    PT Home Exercise Plan  see patient education section    Consulted and Agree with Plan of Care  Patient       Patient will benefit from skilled therapeutic intervention in order to improve the following deficits and impairments:  Abnormal gait, Pain, Postural dysfunction, Decreased activity tolerance, Decreased range of motion, Decreased strength  Visit Diagnosis: Chronic bilateral low back pain without sciatica  Abnormal posture  Muscle weakness (generalized)     Problem List Patient Active Problem List   Diagnosis Date Noted  . Light headedness 05/09/2018  . Osteopenia of the elderly 02/19/2014  . DDD (degenerative disc disease), lumbar 12/03/2013  .  Scoliosis 12/03/2013  . Chronic LBP 05/14/2013  . IBS (irritable bowel syndrome) 11/08/2012  . UC (ulcerative colitis) ([place]) 04/23/2012  . Palpitation 10/26/2011  . Hypertension 10/26/2011  . Chronic ulcerative colitis ([place]) 03/23/2011    Gabriela Eves, PT, DPT 06/26/2018, 9:42 PM  [facility] [place], [place], [address] Phone: [phone]   Fax:  [phone]  Name: Lori Soto MRN: [id number] Date of Birth: 06-28-1935

## 2018-06-28 ENCOUNTER — Ambulatory Visit: Payer: Medicare Other | Admitting: *Deleted

## 2018-06-28 DIAGNOSIS — R293 Abnormal posture: Secondary | ICD-10-CM | POA: Diagnosis not present

## 2018-06-28 DIAGNOSIS — M546 Pain in thoracic spine: Secondary | ICD-10-CM

## 2018-06-28 DIAGNOSIS — G8929 Other chronic pain: Secondary | ICD-10-CM

## 2018-06-28 DIAGNOSIS — M6281 Muscle weakness (generalized): Secondary | ICD-10-CM | POA: Diagnosis not present

## 2018-06-28 DIAGNOSIS — M545 Low back pain: Principal | ICD-10-CM

## 2018-06-28 NOTE — Therapy (Signed)
[facility] [place], [place], [address] Phone: [phone]   Fax:  [phone]  Physical Therapy Treatment  Patient Details  Name: Lori Soto MRN: [id number] Date of Birth: [birth year] Referring Provider (PT): Beau Fanny, [place]   Encounter Date: 06/28/2018  PT End of Session - 06/28/18 1813    Visit Number  2    Number of Visits  12    Date for PT Re-Evaluation  08/14/18    PT Start Time  7412    PT Stop Time  8786    PT Time Calculation (min)  49 min       Past Medical History:  Diagnosis Date  . Anemia   . Arthritis   . Cancer ([place])    skin cancer on nose  . Carpal tunnel syndrome, bilateral   . Cataract   . Esophagitis   . GERD (gastroesophageal reflux disease)   . H/O hiatal hernia   . Osteopenia   . Scoliosis   . Shingles   . Ulcerative colitis     Past Surgical History:  Procedure Laterality Date  . ABDOMINAL HYSTERECTOMY    . BUNIONECTOMY     Bilateral  . CARPAL TUNNEL RELEASE Left 03/10/2015   Procedure: LEFT CARPAL TUNNEL RELEASE;  Surgeon: Daryll Brod, MD;  Location: [place];  Service: Orthopedics;  Laterality: Left;  . CARPAL TUNNEL RELEASE Right 10/31/2017   Procedure: RIGHT CARPAL TUNNEL RELEASE;  Surgeon: Daryll Brod, MD;  Location: [place];  Service: Orthopedics;  Laterality: Right;  . CATARACT EXTRACTION W/PHACO  05/28/2012   Procedure: CATARACT EXTRACTION PHACO AND INTRAOCULAR LENS PLACEMENT (IOC);  Surgeon: Tonny Branch, MD;  [facility];  Service: Ophthalmology;  Laterality: Right;  CDE=12.84  . CATARACT EXTRACTION W/PHACO  06/07/2012   Procedure: CATARACT EXTRACTION PHACO AND INTRAOCULAR LENS PLACEMENT (IOC);  Surgeon: Tonny Branch, MD;  [facility];  Service: Ophthalmology;  Laterality: Left;  CDE 17.60  . COLONOSCOPY    . COLONOSCOPY N/A 07/08/2015   Procedure: COLONOSCOPY;  Surgeon: Rogene Houston, MD;  [facility];  Service:  Endoscopy;  Laterality: N/A;  930  . ESOPHAGEAL DILATION N/A 07/08/2015   Procedure: ESOPHAGEAL DILATION;  Surgeon: Rogene Houston, MD;  [facility];  Service: Endoscopy;  Laterality: N/A;  . ESOPHAGOGASTRODUODENOSCOPY N/A 07/08/2015   Procedure: ESOPHAGOGASTRODUODENOSCOPY (EGD);  Surgeon: Rogene Houston, MD;  [facility];  Service: Endoscopy;  Laterality: N/A;  . Hernia     Right inguinal  . HERNIA REPAIR  1961   right inguinal hernia  . UPPER GASTROINTESTINAL ENDOSCOPY      There were no vitals filed for this visit.  Subjective Assessment - 06/28/18 1515    Pertinent History  Scoliosis, hiatial hernia, osteopenia, allergy to tape    Limitations  Sitting;House hold activities;Standing;Walking    How long can you stand comfortably?  20 minutes    How long can you walk comfortably?  20-30 minutes max    Diagnostic tests  x-ray    Patient Stated Goals  improve ability to perform home activities with less pain    Currently in Pain?  Yes    Pain Location  Back    Pain Orientation  Lower    Pain Onset  More than a month ago                       [facility] Adult PT Treatment/Exercise -  06/28/18 0001      Modalities   Modalities  Electrical Stimulation;Moist Heat;Ultrasound;Traction      Moist Heat Therapy   Number Minutes Moist Heat  15 Minutes    Moist Heat Location  Lumbar Spine      Electrical Stimulation   Electrical Stimulation Location  bil.thoracolumbar paras IFC 80--150hz  x 15 mins    Electrical Stimulation Goals  Pain      Traction   Type of Traction  Lumbar    Min (lbs)  5    Max (lbs)  60    Hold Time  99    Rest Time  10    Time  15      Manual Therapy   Manual Therapy  Soft tissue mobilization    Soft tissue mobilization  STW to R lumbar paraspinals, QL to promote reduction of pain and tightness in LT sidelying                  PT Long Term Goals - 06/26/18 1333      PT LONG TERM GOAL #1   Title  Patient will  be independent with HEP    Baseline       Time  6    Period  Weeks    Status  New      PT LONG TERM GOAL #2   Title  Patient will report ability to perfrom ADLs with low back pain less than or equal to 4/10    Baseline       Time  6    Period  Weeks    Status  New      PT LONG TERM GOAL #3   Title  Patient will demonstrate 4/5 or greater bilateral LE MMT to improve stability during functional tasks.    Baseline       Time  6      PT LONG TERM GOAL #4   Title  Patient will report ability to stand for 20 minutes or greater with low back pain less than or equal to 5/10 to improve ability to perform home tasks.     Time  6    Period  Weeks    Status  New            Plan - 06/28/18 1517    Clinical Impression Statement  Pt arrived today with soreness and tightnness in thoracolumbar area. She reports of knee pain and not wanting to perform Nustep. Traction started out at 60#s today and Pt did well. Normal modality response today    Clinical Presentation  Evolving    Rehab Potential  Good    PT Frequency  2x / week    PT Duration  6 weeks    PT Treatment/Interventions  Electrical Stimulation;ADLs/Self Care Home Management;Cryotherapy;Traction;Ultrasound;Moist Heat;Iontophoresis 28m/ml Dexamethasone;Gait training;Stair training;Manual techniques;Dry needling;Passive range of motion;Balance training;Neuromuscular re-education;Therapeutic exercise;Therapeutic activities;Patient/family education    PT Next Visit Plan  nustep (if tolerable for knees) lumbar traction, modalities for pain relief.  Assess traction    PT Home Exercise Plan  see patient education section       Patient will benefit from skilled therapeutic intervention in order to improve the following deficits and impairments:  Abnormal gait, Pain, Postural dysfunction, Decreased activity tolerance, Decreased range of motion, Decreased strength  Visit Diagnosis: Chronic bilateral low back pain without  sciatica  Abnormal posture  Muscle weakness (generalized)  Pain in thoracic spine     Problem List Patient Active Problem  List   Diagnosis Date Noted  . Light headedness 05/09/2018  . Osteopenia of the elderly 02/19/2014  . DDD (degenerative disc disease), lumbar 12/03/2013  . Scoliosis 12/03/2013  . Chronic LBP 05/14/2013  . IBS (irritable bowel syndrome) 11/08/2012  . UC (ulcerative colitis) ([place]) 04/23/2012  . Palpitation 10/26/2011  . Hypertension 10/26/2011  . Chronic ulcerative colitis ([place]) 03/23/2011    Cindy Fullman,CHRIS, PTA 06/28/2018, 6:19 PM  [facility] [place], [place], [address] Phone: [phone]   Fax:  [phone]  Name: Lori Soto MRN: [id number] Date of Birth: 1936/02/06

## 2018-07-02 ENCOUNTER — Ambulatory Visit: Payer: Medicare Other | Attending: Orthopaedic Surgery | Admitting: Physical Therapy

## 2018-07-02 ENCOUNTER — Encounter: Payer: Self-pay | Admitting: Physical Therapy

## 2018-07-02 DIAGNOSIS — M6281 Muscle weakness (generalized): Secondary | ICD-10-CM | POA: Insufficient documentation

## 2018-07-02 DIAGNOSIS — R293 Abnormal posture: Secondary | ICD-10-CM | POA: Diagnosis not present

## 2018-07-02 DIAGNOSIS — G8929 Other chronic pain: Secondary | ICD-10-CM | POA: Diagnosis not present

## 2018-07-02 DIAGNOSIS — M545 Low back pain: Secondary | ICD-10-CM | POA: Diagnosis not present

## 2018-07-02 NOTE — Therapy (Signed)
[place], [place], [address] Phone: [phone]   Fax:  [phone]  Physical Therapy Treatment  Patient Details  Name: Lori Soto MRN: [id number] Date of Birth: 1936/03/02 Referring Provider (PT): Beau Fanny, [place]   Encounter Date: 07/02/2018  PT End of Session - 07/02/18 1507    Visit Number  3    Number of Visits  12    Date for PT Re-Evaluation  08/14/18    PT Start Time  3419    PT Stop Time  1533    PT Time Calculation (min)  59 min    Activity Tolerance  Patient tolerated treatment well    Behavior During Therapy  [facility] for tasks assessed/performed       Past Medical History:  Diagnosis Date  . Anemia   . Arthritis   . Cancer ([place])    skin cancer on nose  . Carpal tunnel syndrome, bilateral   . Cataract   . Esophagitis   . GERD (gastroesophageal reflux disease)   . H/O hiatal hernia   . Osteopenia   . Scoliosis   . Shingles   . Ulcerative colitis     Past Surgical History:  Procedure Laterality Date  . ABDOMINAL HYSTERECTOMY    . BUNIONECTOMY     Bilateral  . CARPAL TUNNEL RELEASE Left 03/10/2015   Procedure: LEFT CARPAL TUNNEL RELEASE;  Surgeon: Daryll Brod, MD;  Location: [place];  Service: Orthopedics;  Laterality: Left;  . CARPAL TUNNEL RELEASE Right 10/31/2017   Procedure: RIGHT CARPAL TUNNEL RELEASE;  Surgeon: Daryll Brod, MD;  Location: [place];  Service: Orthopedics;  Laterality: Right;  . CATARACT EXTRACTION W/PHACO  05/28/2012   Procedure: CATARACT EXTRACTION PHACO AND INTRAOCULAR LENS PLACEMENT (IOC);  Surgeon: Tonny Branch, MD;  [facility];  Service: Ophthalmology;  Laterality: Right;  CDE=12.84  . CATARACT EXTRACTION W/PHACO  06/07/2012   Procedure: CATARACT EXTRACTION PHACO AND INTRAOCULAR LENS PLACEMENT (IOC);  Surgeon: Tonny Branch, MD;  [facility];  Service: Ophthalmology;  Laterality: Left;  CDE 17.60  . COLONOSCOPY    .  COLONOSCOPY N/A 07/08/2015   Procedure: COLONOSCOPY;  Surgeon: Rogene Houston, MD;  [facility];  Service: Endoscopy;  Laterality: N/A;  930  . ESOPHAGEAL DILATION N/A 07/08/2015   Procedure: ESOPHAGEAL DILATION;  Surgeon: Rogene Houston, MD;  [facility];  Service: Endoscopy;  Laterality: N/A;  . ESOPHAGOGASTRODUODENOSCOPY N/A 07/08/2015   Procedure: ESOPHAGOGASTRODUODENOSCOPY (EGD);  Surgeon: Rogene Houston, MD;  [facility];  Service: Endoscopy;  Laterality: N/A;  . Hernia     Right inguinal  . HERNIA REPAIR  1961   right inguinal hernia  . UPPER GASTROINTESTINAL ENDOSCOPY      There were no vitals filed for this visit.  Subjective Assessment - 07/02/18 1504    Subjective  Patient reported she has been running so many errands that she has not even thought about her back.    Pertinent History  Scoliosis, hiatial hernia, osteopenia, allergy to tape    Limitations  Sitting;House hold activities;Standing;Walking    How long can you stand comfortably?  20 minutes    How long can you walk comfortably?  20-30 minutes max    Diagnostic tests  x-ray    Patient Stated Goals  improve ability to perform home activities with less pain    Currently in Pain?  Yes   did not provide pain scale  [facility] PT Assessment - 07/02/18 0001      Assessment   Medical Diagnosis  spondylosis without myelopathy or radiculopathy    Referring Provider (PT)  Beau Fanny, PA-C    Next MD Visit  End of March    Prior Therapy  yes      Precautions   Precautions  None      Restrictions   Weight Bearing Restrictions  No                   [facility] Adult PT Treatment/Exercise - 07/02/18 0001      Modalities   Modalities  Electrical Stimulation;Moist Heat;Ultrasound;Traction      Moist Heat Therapy   Number Minutes Moist Heat  15 Minutes    Moist Heat Location  Lumbar Spine      Electrical Stimulation   Electrical Stimulation Location  bilateral  thoracolumbar paraspinals    Electrical Stimulation Action  IFC    Electrical Stimulation Parameters  80-150 hz x15 mins    Electrical Stimulation Goals  Pain      Traction   Type of Traction  Lumbar    Min (lbs)  5    Max (lbs)  60    Hold Time  99    Rest Time  10    Time  15      Manual Therapy   Manual Therapy  Soft tissue mobilization    Soft tissue mobilization  STW to R lumbar paraspinals, QL to promote reduction of pain and tightness in LT sidelying                  PT Long Term Goals - 06/26/18 1333      PT LONG TERM GOAL #1   Title  Patient will be independent with HEP    Baseline       Time  6    Period  Weeks    Status  New      PT LONG TERM GOAL #2   Title  Patient will report ability to perfrom ADLs with low back pain less than or equal to 4/10    Baseline       Time  6    Period  Weeks    Status  New      PT LONG TERM GOAL #3   Title  Patient will demonstrate 4/5 or greater bilateral LE MMT to improve stability during functional tasks.    Baseline       Time  6      PT LONG TERM GOAL #4   Title  Patient will report ability to stand for 20 minutes or greater with low back pain less than or equal to 5/10 to improve ability to perform home tasks.     Time  6    Period  Weeks    Status  New            Plan - 07/02/18 1507    Clinical Impression Statement  Patient was able to tolerate treatment well. Patinet noted increased tension in right QL which reduced after STW/M. Patient tolerated traction at 60# today and had no adverse affects last visit or this visit. Increase weight next visit if tolerated.    Stability/Clinical Decision Making  Evolving/Moderate complexity    Clinical Decision Making  Low    Rehab Potential  Good    PT Frequency  2x / week    PT Duration  6 weeks  PT Treatment/Interventions  Electrical Stimulation;ADLs/Self Care Home Management;Cryotherapy;Traction;Ultrasound;Moist Heat;Iontophoresis 61m/ml  Dexamethasone;Gait training;Stair training;Manual techniques;Dry needling;Passive range of motion;Balance training;Neuromuscular re-education;Therapeutic exercise;Therapeutic activities;Patient/family education    PT Next Visit Plan   lumbar traction, STW/M modalities for pain relief.  Assess traction    Consulted and Agree with Plan of Care  Patient       Patient will benefit from skilled therapeutic intervention in order to improve the following deficits and impairments:  Abnormal gait, Pain, Postural dysfunction, Decreased activity tolerance, Decreased range of motion, Decreased strength  Visit Diagnosis: Chronic bilateral low back pain without sciatica  Abnormal posture     Problem List Patient Active Problem List   Diagnosis Date Noted  . Light headedness 05/09/2018  . Osteopenia of the elderly 02/19/2014  . DDD (degenerative disc disease), lumbar 12/03/2013  . Scoliosis 12/03/2013  . Chronic LBP 05/14/2013  . IBS (irritable bowel syndrome) 11/08/2012  . [place] 04/[place]  . Palpitation 10/26/2011  . Hypertension 10/26/2011  . [place] 03/23/2011    KGabriela Eves PT, DPT 07/02/2018, 7:58 PM  [facility] [address] [place] [address]Phone: [phone]  Fax:  [phone] Name: BArista KettlewellMRN: [id number] of Birth: [birth year]

## 2018-07-04 ENCOUNTER — Ambulatory Visit: Payer: Medicare Other | Admitting: Physical Therapy

## 2018-07-04 DIAGNOSIS — M545 Low back pain: Secondary | ICD-10-CM | POA: Diagnosis not present

## 2018-07-04 DIAGNOSIS — G8929 Other chronic pain: Secondary | ICD-10-CM

## 2018-07-04 DIAGNOSIS — M6281 Muscle weakness (generalized): Secondary | ICD-10-CM | POA: Diagnosis not present

## 2018-07-04 DIAGNOSIS — R293 Abnormal posture: Secondary | ICD-10-CM | POA: Diagnosis not present

## 2018-07-04 NOTE — Therapy (Signed)
[facility] [place], [place], [address] Phone: [phone]   Fax:  [phone]  Physical Therapy Treatment  Patient Details  Name: Lori Soto MRN: [id number] Date of Birth: [birth year] Referring Provider (PT): Beau Fanny, [place]   Encounter Date: 07/04/2018  PT End of Session - 07/04/18 1433    Visit Number  4    Number of Visits  12    Date for PT Re-Evaluation  08/14/18    PT Start Time  8889    PT Stop Time  1539    PT Time Calculation (min)  57 min    Activity Tolerance  Patient tolerated treatment well    Behavior During Therapy  [facility] for tasks assessed/performed       Past Medical History:  Diagnosis Date  . Anemia   . Arthritis   . Cancer ([place])    skin cancer on nose  . Carpal tunnel syndrome, bilateral   . Cataract   . Esophagitis   . GERD (gastroesophageal reflux disease)   . H/O hiatal hernia   . Osteopenia   . Scoliosis   . Shingles   . Ulcerative colitis     Past Surgical History:  Procedure Laterality Date  . ABDOMINAL HYSTERECTOMY    . BUNIONECTOMY     Bilateral  . CARPAL TUNNEL RELEASE Left 03/10/2015   Procedure: LEFT CARPAL TUNNEL RELEASE;  Surgeon: Daryll Brod, MD;  Location: [place];  Service: Orthopedics;  Laterality: Left;  . CARPAL TUNNEL RELEASE Right 10/31/2017   Procedure: RIGHT CARPAL TUNNEL RELEASE;  Surgeon: Daryll Brod, MD;  Location: [place];  Service: Orthopedics;  Laterality: Right;  . CATARACT EXTRACTION W/PHACO  05/28/2012   Procedure: CATARACT EXTRACTION PHACO AND INTRAOCULAR LENS PLACEMENT (IOC);  Surgeon: Tonny Branch, MD;  [facility];  Service: Ophthalmology;  Laterality: Right;  CDE=12.84  . CATARACT EXTRACTION W/PHACO  06/07/2012   Procedure: CATARACT EXTRACTION PHACO AND INTRAOCULAR LENS PLACEMENT (IOC);  Surgeon: Tonny Branch, MD;  [facility];  Service: Ophthalmology;  Laterality: Left;  CDE 17.60  . COLONOSCOPY    .  COLONOSCOPY N/A 07/08/2015   Procedure: COLONOSCOPY;  Surgeon: Rogene Houston, MD;  [facility];  Service: Endoscopy;  Laterality: N/A;  930  . ESOPHAGEAL DILATION N/A 07/08/2015   Procedure: ESOPHAGEAL DILATION;  Surgeon: Rogene Houston, MD;  [facility];  Service: Endoscopy;  Laterality: N/A;  . ESOPHAGOGASTRODUODENOSCOPY N/A 07/08/2015   Procedure: ESOPHAGOGASTRODUODENOSCOPY (EGD);  Surgeon: Rogene Houston, MD;  [facility];  Service: Endoscopy;  Laterality: N/A;  . Hernia     Right inguinal  . HERNIA REPAIR  1961   right inguinal hernia  . UPPER GASTROINTESTINAL ENDOSCOPY      There were no vitals filed for this visit.  Subjective Assessment - 07/04/18 1432    Subjective  Reports changing sheets on the bed for her friend today.    Pertinent History  Scoliosis, hiatial hernia, osteopenia, allergy to tape    Limitations  Sitting;House hold activities;Standing;Walking    How long can you stand comfortably?  20 minutes    How long can you walk comfortably?  20-30 minutes max    Diagnostic tests  x-ray    Patient Stated Goals  improve ability to perform home activities with less pain    Currently in Pain?  Yes    Pain Score  4     Pain Location  Back  Pain Orientation  Lower;Right    Pain Descriptors / Indicators  Tightness    Pain Type  Chronic pain    Pain Onset  More than a month ago         [facility] PT Assessment - 07/04/18 0001      Assessment   Medical Diagnosis  spondylosis without myelopathy or radiculopathy    Referring Provider (PT)  Beau Fanny, PA-C    Next MD Visit  End of March    Prior Therapy  yes      Precautions   Precautions  None      Restrictions   Weight Bearing Restrictions  No                   [facility] Adult PT Treatment/Exercise - 07/04/18 0001      Modalities   Modalities  Electrical Stimulation;Moist Heat;Ultrasound;Traction      Moist Heat Therapy   Number Minutes Moist Heat  15 Minutes     Moist Heat Location  Lumbar Spine      Electrical Stimulation   Electrical Stimulation Location  bilateral thoracolumbar paraspinals    Electrical Stimulation Action  IFC    Electrical Stimulation Parameters  80-150 hz x15 min    Electrical Stimulation Goals  Pain      Traction   Type of Traction  Lumbar    Min (lbs)  5    Max (lbs)  60    Hold Time  99    Rest Time  10    Time  15      Manual Therapy   Manual Therapy  Soft tissue mobilization    Soft tissue mobilization  STW to R lumbar paraspinals, QL to promote reduction of pain and tightness in LT sidelying                  PT Long Term Goals - 06/26/18 1333      PT LONG TERM GOAL #1   Title  Patient will be independent with HEP    Baseline       Time  6    Period  Weeks    Status  New      PT LONG TERM GOAL #2   Title  Patient will report ability to perfrom ADLs with low back pain less than or equal to 4/10    Baseline       Time  6    Period  Weeks    Status  New      PT LONG TERM GOAL #3   Title  Patient will demonstrate 4/5 or greater bilateral LE MMT to improve stability during functional tasks.    Baseline       Time  6      PT LONG TERM GOAL #4   Title  Patient will report ability to stand for 20 minutes or greater with low back pain less than or equal to 5/10 to improve ability to perform home tasks.     Time  6    Period  Weeks    Status  New            Plan - 07/04/18 1531    Clinical Impression Statement  Patient presented in clinic with continued increased LBP due to pronounced scoliosis curvature in lumbar spine. Muscle tightness palpable along R lumbar paraspinals. Normal modalities response noted following end of all modalities.    Stability/Clinical Decision Making  Evolving/Moderate complexity  Rehab Potential  Good    PT Frequency  2x / week    PT Duration  6 weeks    PT Treatment/Interventions  Electrical Stimulation;ADLs/Self Care Home  Management;Cryotherapy;Traction;Ultrasound;Moist Heat;Iontophoresis 103m/ml Dexamethasone;Gait training;Stair training;Manual techniques;Dry needling;Passive range of motion;Balance training;Neuromuscular re-education;Therapeutic exercise;Therapeutic activities;Patient/family education    PT Next Visit Plan   lumbar traction, STW/M modalities for pain relief.  Assess traction    PT Home Exercise Plan  see patient education section    Consulted and Agree with Plan of Care  Patient       Patient will benefit from skilled therapeutic intervention in order to improve the following deficits and impairments:  Abnormal gait, Pain, Postural dysfunction, Decreased activity tolerance, Decreased range of motion, Decreased strength  Visit Diagnosis: Chronic bilateral low back pain without sciatica  Abnormal posture  Muscle weakness (generalized)     Problem List Patient Active Problem List   Diagnosis Date Noted  . Light headedness 05/09/2018  . Osteopenia of the elderly 02/19/2014  . DDD (degenerative disc disease), lumbar 12/03/2013  . Scoliosis 12/03/2013  . Chronic LBP 05/14/2013  . IBS (irritable bowel syndrome) 11/08/2012  . UC (ulcerative colitis) ([place] 04/23/2012  . Palpitation 10/26/2011  . Hypertension 10/26/2011  . Chronic ulcerative colitis ([place] 03/23/2011    KStandley Brooking PTA 07/04/2018, 3:44 PM  [facility] [address] [place] [address]Phone: [phone]  Fax:  [phone] Name: Lori HitzMRN: [id number] of Birth: 801/28/1937

## 2018-07-10 ENCOUNTER — Ambulatory Visit: Payer: Medicare Other | Admitting: Physical Therapy

## 2018-07-10 DIAGNOSIS — G8929 Other chronic pain: Secondary | ICD-10-CM | POA: Diagnosis not present

## 2018-07-10 DIAGNOSIS — M6281 Muscle weakness (generalized): Secondary | ICD-10-CM | POA: Diagnosis not present

## 2018-07-10 DIAGNOSIS — R293 Abnormal posture: Secondary | ICD-10-CM | POA: Diagnosis not present

## 2018-07-10 DIAGNOSIS — M545 Low back pain: Principal | ICD-10-CM

## 2018-07-10 NOTE — Therapy (Signed)
[facility] [place], [place], [address] Phone: [phone]   Fax:  [phone]  Physical Therapy Treatment  Patient Details  Name: Lori Soto MRN: [id number] Date of Birth: October 21, 1935 Referring Provider (PT): Beau Fanny, [place]   Encounter Date: 07/10/2018  PT End of Session - 07/10/18 1402    Visit Number  5    Number of Visits  12    Date for PT Re-Evaluation  08/14/18    PT Start Time  1404   Late arrival   PT Stop Time  1443    PT Time Calculation (min)  39 min    Activity Tolerance  Patient tolerated treatment well    Behavior During Therapy  [facility] for tasks assessed/performed       Past Medical History:  Diagnosis Date  . Anemia   . Arthritis   . Cancer ([place])    skin cancer on nose  . Carpal tunnel syndrome, bilateral   . Cataract   . Esophagitis   . GERD (gastroesophageal reflux disease)   . H/O hiatal hernia   . Osteopenia   . Scoliosis   . Shingles   . Ulcerative colitis     Past Surgical History:  Procedure Laterality Date  . ABDOMINAL HYSTERECTOMY    . BUNIONECTOMY     Bilateral  . CARPAL TUNNEL RELEASE Left 03/10/2015   Procedure: LEFT CARPAL TUNNEL RELEASE;  Surgeon: Daryll Brod, MD;  Location: [place];  Service: Orthopedics;  Laterality: Left;  . CARPAL TUNNEL RELEASE Right 10/31/2017   Procedure: RIGHT CARPAL TUNNEL RELEASE;  Surgeon: Daryll Brod, MD;  Location: [place];  Service: Orthopedics;  Laterality: Right;  . CATARACT EXTRACTION W/PHACO  05/28/2012   Procedure: CATARACT EXTRACTION PHACO AND INTRAOCULAR LENS PLACEMENT (IOC);  Surgeon: Tonny Branch, MD;  [facility];  Service: Ophthalmology;  Laterality: Right;  CDE=12.84  . CATARACT EXTRACTION W/PHACO  06/07/2012   Procedure: CATARACT EXTRACTION PHACO AND INTRAOCULAR LENS PLACEMENT (IOC);  Surgeon: Tonny Branch, MD;  [facility];  Service: Ophthalmology;  Laterality: Left;  CDE 17.60  .  COLONOSCOPY    . COLONOSCOPY N/A 07/08/2015   Procedure: COLONOSCOPY;  Surgeon: Rogene Houston, MD;  [facility];  Service: Endoscopy;  Laterality: N/A;  930  . ESOPHAGEAL DILATION N/A 07/08/2015   Procedure: ESOPHAGEAL DILATION;  Surgeon: Rogene Houston, MD;  [facility];  Service: Endoscopy;  Laterality: N/A;  . ESOPHAGOGASTRODUODENOSCOPY N/A 07/08/2015   Procedure: ESOPHAGOGASTRODUODENOSCOPY (EGD);  Surgeon: Rogene Houston, MD;  [facility];  Service: Endoscopy;  Laterality: N/A;  . Hernia     Right inguinal  . HERNIA REPAIR  1961   right inguinal hernia  . UPPER GASTROINTESTINAL ENDOSCOPY      There were no vitals filed for this visit.  Subjective Assessment - 07/10/18 1401    Subjective  Reports pretty good today as she hasn't done much.    Pertinent History  Scoliosis, hiatial hernia, osteopenia, allergy to tape    Limitations  Sitting;House hold activities;Standing;Walking    How long can you stand comfortably?  20 minutes    How long can you walk comfortably?  20-30 minutes max    Diagnostic tests  x-ray    Patient Stated Goals  improve ability to perform home activities with less pain    Currently in Pain?  Yes    Pain Score  5     Pain Location  Back    Pain Orientation  Lower;Left    Pain Descriptors / Indicators  Discomfort    Pain Type  Chronic pain    Pain Onset  More than a month ago    Pain Frequency  Constant         [facility] PT Assessment - 07/10/18 0001      Assessment   Medical Diagnosis  spondylosis without myelopathy or radiculopathy    Referring Provider (PT)  Beau Fanny, PA-C    Next MD Visit  End of March    Prior Therapy  yes      Precautions   Precautions  None      Restrictions   Weight Bearing Restrictions  No                   [facility] Adult PT Treatment/Exercise - 07/10/18 0001      Modalities   Modalities  Electrical Stimulation;Moist Heat;Traction      Moist Heat Therapy   Number  Minutes Moist Heat  15 Minutes    Moist Heat Location  Lumbar Spine      Electrical Stimulation   Electrical Stimulation Location  B thoracolumbar paraspinals    Electrical Stimulation Action  IFC    Electrical Stimulation Parameters  80-150 hz x15 min    Electrical Stimulation Goals  Pain      Traction   Type of Traction  Lumbar    Min (lbs)  5    Max (lbs)  65    Hold Time  99    Rest Time  10    Time  15                  PT Long Term Goals - 07/10/18 1417      PT LONG TERM GOAL #1   Title  Patient will be independent with HEP    Baseline       Time  6    Period  Weeks    Status  On-going      PT LONG TERM GOAL #2   Title  Patient will report ability to perfrom ADLs with low back pain less than or equal to 4/10    Baseline       Time  6    Period  Weeks    Status  On-going      PT LONG TERM GOAL #3   Title  Patient will demonstrate 4/5 or greater bilateral LE MMT to improve stability during functional tasks.    Baseline       Time  6    Status  On-going      PT LONG TERM GOAL #4   Title  Patient will report ability to stand for 20 minutes or greater with low back pain less than or equal to 5/10 to improve ability to perform home tasks.     Time  6    Period  Weeks    Status  On-going            Plan - 07/10/18 1417    Clinical Impression Statement  Patient arrived late to clinic thus limiting PT session. Patient opted for modalities session due to time. Patient continues to experience highly rated LBP during any standing activities and begins quickly per patient report. Normal modalities response noted following removal of the modalities,    Stability/Clinical Decision Making  Evolving/Moderate complexity    Rehab Potential  Good    PT Frequency  2x / week    PT Duration  6 weeks    PT Treatment/Interventions  Electrical Stimulation;ADLs/Self Care Home Management;Cryotherapy;Traction;Ultrasound;Moist Heat;Iontophoresis 79m/ml Dexamethasone;Gait  training;Stair training;Manual techniques;Dry needling;Passive range of motion;Balance training;Neuromuscular re-education;Therapeutic exercise;Therapeutic activities;Patient/family education    PT Next Visit Plan   lumbar traction, STW/M modalities for pain relief.  Assess traction    PT Home Exercise Plan  see patient education section    Consulted and Agree with Plan of Care  Patient       Patient will benefit from skilled therapeutic intervention in order to improve the following deficits and impairments:  Abnormal gait, Pain, Postural dysfunction, Decreased activity tolerance, Decreased range of motion, Decreased strength  Visit Diagnosis: Chronic bilateral low back pain without sciatica  Abnormal posture  Muscle weakness (generalized)     Problem List Patient Active Problem List   Diagnosis Date Noted  . Light headedness 05/09/2018  . Osteopenia of the elderly 02/19/2014  . DDD (degenerative disc disease), lumbar 12/03/2013  . Scoliosis 12/03/2013  . Chronic LBP 05/14/2013  . IBS (irritable bowel syndrome) 11/08/2012  . UC (ulcerative colitis) ([facility] 04/23/2012  . Palpitation 10/26/2011  . Hypertension 10/26/2011  . Chronic ulcerative colitis ([place] 03/23/2011    KStandley Brooking PTA 07/10/2018, 2:46 PM  [facility] [address] [place] [address]Phone: [phone]  Fax:  [phone] Name: Lori BasleyMRN: [id number] of Birth: 8Aug 19, 1937

## 2018-07-12 ENCOUNTER — Other Ambulatory Visit: Payer: Self-pay

## 2018-07-12 ENCOUNTER — Ambulatory Visit: Payer: Medicare Other | Admitting: Physical Therapy

## 2018-07-12 DIAGNOSIS — M545 Low back pain: Principal | ICD-10-CM

## 2018-07-12 DIAGNOSIS — G8929 Other chronic pain: Secondary | ICD-10-CM

## 2018-07-12 DIAGNOSIS — R293 Abnormal posture: Secondary | ICD-10-CM

## 2018-07-12 DIAGNOSIS — M6281 Muscle weakness (generalized): Secondary | ICD-10-CM | POA: Diagnosis not present

## 2018-07-12 NOTE — Therapy (Signed)
[facility] [place], [place], [address] Phone: [phone]   Fax:  [phone]  Physical Therapy Treatment  Patient Details  Name: Lori Soto MRN: [id number] Date of Birth: March 01, 1936 Referring Provider (PT): Beau Fanny, [place]   Encounter Date: 07/12/2018  PT End of Session - 07/12/18 1600    Visit Number  6    Number of Visits  12    Date for PT Re-Evaluation  08/14/18    PT Start Time  0240   Late arrival.   PT Stop Time  0317    PT Time Calculation (min)  37 min    Activity Tolerance  Patient tolerated treatment well    Behavior During Therapy  [facility] for tasks assessed/performed       Past Medical History:  Diagnosis Date  . Anemia   . Arthritis   . Cancer ([place])    skin cancer on nose  . Carpal tunnel syndrome, bilateral   . Cataract   . Esophagitis   . GERD (gastroesophageal reflux disease)   . H/O hiatal hernia   . Osteopenia   . Scoliosis   . Shingles   . Ulcerative colitis     Past Surgical History:  Procedure Laterality Date  . ABDOMINAL HYSTERECTOMY    . BUNIONECTOMY     Bilateral  . CARPAL TUNNEL RELEASE Left 03/10/2015   Procedure: LEFT CARPAL TUNNEL RELEASE;  Surgeon: Daryll Brod, MD;  Location: [place];  Service: Orthopedics;  Laterality: Left;  . CARPAL TUNNEL RELEASE Right 10/31/2017   Procedure: RIGHT CARPAL TUNNEL RELEASE;  Surgeon: Daryll Brod, MD;  Location: [place];  Service: Orthopedics;  Laterality: Right;  . CATARACT EXTRACTION W/PHACO  05/28/2012   Procedure: CATARACT EXTRACTION PHACO AND INTRAOCULAR LENS PLACEMENT (IOC);  Surgeon: Tonny Branch, MD;  [facility];  Service: Ophthalmology;  Laterality: Right;  CDE=12.84  . CATARACT EXTRACTION W/PHACO  06/07/2012   Procedure: CATARACT EXTRACTION PHACO AND INTRAOCULAR LENS PLACEMENT (IOC);  Surgeon: Tonny Branch, MD;  [facility];  Service: Ophthalmology;  Laterality: Left;  CDE 17.60  .  COLONOSCOPY    . COLONOSCOPY N/A 07/08/2015   Procedure: COLONOSCOPY;  Surgeon: Rogene Houston, MD;  [facility];  Service: Endoscopy;  Laterality: N/A;  930  . ESOPHAGEAL DILATION N/A 07/08/2015   Procedure: ESOPHAGEAL DILATION;  Surgeon: Rogene Houston, MD;  [facility];  Service: Endoscopy;  Laterality: N/A;  . ESOPHAGOGASTRODUODENOSCOPY N/A 07/08/2015   Procedure: ESOPHAGOGASTRODUODENOSCOPY (EGD);  Surgeon: Rogene Houston, MD;  [facility];  Service: Endoscopy;  Laterality: N/A;  . Hernia     Right inguinal  . HERNIA REPAIR  1961   right inguinal hernia  . UPPER GASTROINTESTINAL ENDOSCOPY      There were no vitals filed for this visit.  Subjective Assessment - 07/12/18 1611    Subjective  Good day.    Currently in Pain?  Yes    Pain Score  5     Pain Location  Back                       [facility] Adult PT Treatment/Exercise - 07/12/18 0001      Modalities   Modalities  Electrical Stimulation;Moist Heat;Traction      Moist Heat Therapy   Number Minutes Moist Heat  15 Minutes    Moist Heat Location  Lumbar Spine      Electrical  Stimulation   Electrical Stimulation Location  Bilateral Thoracolumbar    Electrical Stimulation Action  IFC    Electrical Stimulation Parameters  80-150 Hz x 15 minutes.    Electrical Stimulation Goals  Pain      Traction   Type of Traction  Lumbar    Min (lbs)  5    Max (lbs)  70    Hold Time  99    Rest Time  5    Time  15                  PT Long Term Goals - 07/10/18 1417      PT LONG TERM GOAL #1   Title  Patient will be independent with HEP    Baseline       Time  6    Period  Weeks    Status  On-going      PT LONG TERM GOAL #2   Title  Patient will report ability to perfrom ADLs with low back pain less than or equal to 4/10    Baseline       Time  6    Period  Weeks    Status  On-going      PT LONG TERM GOAL #3   Title  Patient will demonstrate 4/5 or greater  bilateral LE MMT to improve stability during functional tasks.    Baseline       Time  6    Status  On-going      PT LONG TERM GOAL #4   Title  Patient will report ability to stand for 20 minutes or greater with low back pain less than or equal to 5/10 to improve ability to perform home tasks.     Time  6    Period  Weeks    Status  On-going            Plan - 07/12/18 1625    Clinical Impression Statement  Patient arriving late to clinic.  She tolerated a 5# increase in lumbar traction without complaint.    PT Treatment/Interventions  Electrical Stimulation;ADLs/Self Care Home Management;Cryotherapy;Traction;Ultrasound;Moist Heat;Iontophoresis 80m/ml Dexamethasone;Gait training;Stair training;Manual techniques;Dry needling;Passive range of motion;Balance training;Neuromuscular re-education;Therapeutic exercise;Therapeutic activities;Patient/family education    PT Home Exercise Plan  see patient education section    Consulted and Agree with Plan of Care  Patient       Patient will benefit from skilled therapeutic intervention in order to improve the following deficits and impairments:  Abnormal gait, Pain, Postural dysfunction, Decreased activity tolerance, Decreased range of motion, Decreased strength  Visit Diagnosis: Chronic bilateral low back pain without sciatica  Abnormal posture     Problem List Patient Active Problem List   Diagnosis Date Noted  . Light headedness 05/09/2018  . Osteopenia of the elderly 02/19/2014  . DDD (degenerative disc disease), lumbar 12/03/2013  . Scoliosis 12/03/2013  . Chronic LBP 05/14/2013  . IBS (irritable bowel syndrome) 11/08/2012  . UC (ulcerative colitis) ([place] 04/23/2012  . Palpitation 10/26/2011  . Hypertension 10/26/2011  . Chronic ulcerative colitis ([place] 03/23/2011    Michaeljoseph Revolorio, CMaliMPT 07/12/2018, 4:32 PM  [facility] [place] [address]Phone:  [phone]  Fax:  [phone] Name: Lori LanganMRN: [id number] of Birth: [birth year]

## 2018-07-16 ENCOUNTER — Ambulatory Visit: Payer: Medicare Other | Admitting: Physical Therapy

## 2018-07-16 ENCOUNTER — Other Ambulatory Visit: Payer: Self-pay

## 2018-07-16 DIAGNOSIS — M6281 Muscle weakness (generalized): Secondary | ICD-10-CM | POA: Diagnosis not present

## 2018-07-16 DIAGNOSIS — R293 Abnormal posture: Secondary | ICD-10-CM | POA: Diagnosis not present

## 2018-07-16 DIAGNOSIS — G8929 Other chronic pain: Secondary | ICD-10-CM | POA: Diagnosis not present

## 2018-07-16 DIAGNOSIS — M545 Low back pain: Principal | ICD-10-CM

## 2018-07-16 NOTE — Therapy (Signed)
[facility] [place], [place], [address] Phone: [phone]   Fax:  [phone]  Physical Therapy Treatment  Patient Details  Name: Lori Soto MRN: [id number] Date of Birth: 02/12/1936 Referring Provider (PT): Beau Fanny, [place]   Encounter Date: 07/16/2018  PT End of Session - 07/16/18 1305    Visit Number  7    Number of Visits  12    Date for PT Re-Evaluation  08/14/18    PT Start Time  8563    PT Stop Time  1402    PT Time Calculation (min)  57 min    Activity Tolerance  Patient tolerated treatment well    Behavior During Therapy  [facility] for tasks assessed/performed       Past Medical History:  Diagnosis Date  . Anemia   . Arthritis   . Cancer ([place])    skin cancer on nose  . Carpal tunnel syndrome, bilateral   . Cataract   . Esophagitis   . GERD (gastroesophageal reflux disease)   . H/O hiatal hernia   . Osteopenia   . Scoliosis   . Shingles   . Ulcerative colitis     Past Surgical History:  Procedure Laterality Date  . ABDOMINAL HYSTERECTOMY    . BUNIONECTOMY     Bilateral  . CARPAL TUNNEL RELEASE Left 03/10/2015   Procedure: LEFT CARPAL TUNNEL RELEASE;  Surgeon: Daryll Brod, MD;  Location: [place];  Service: Orthopedics;  Laterality: Left;  . CARPAL TUNNEL RELEASE Right 10/31/2017   Procedure: RIGHT CARPAL TUNNEL RELEASE;  Surgeon: Daryll Brod, MD;  Location: [place];  Service: Orthopedics;  Laterality: Right;  . CATARACT EXTRACTION W/PHACO  05/28/2012   Procedure: CATARACT EXTRACTION PHACO AND INTRAOCULAR LENS PLACEMENT (IOC);  Surgeon: Tonny Branch, MD;  [facility];  Service: Ophthalmology;  Laterality: Right;  CDE=12.84  . CATARACT EXTRACTION W/PHACO  06/07/2012   Procedure: CATARACT EXTRACTION PHACO AND INTRAOCULAR LENS PLACEMENT (IOC);  Surgeon: Tonny Branch, MD;  [facility]: [facility];  Service: Ophthalmology;  Laterality: Left;  CDE 17.60  . COLONOSCOPY    .  COLONOSCOPY N/A 07/08/2015   Procedure: COLONOSCOPY;  Surgeon: Rogene Houston, MD;  [facility];  Service: Endoscopy;  Laterality: N/A;  930  . ESOPHAGEAL DILATION N/A 07/08/2015   Procedure: ESOPHAGEAL DILATION;  Surgeon: Rogene Houston, MD;  [facility];  Service: Endoscopy;  Laterality: N/A;  . ESOPHAGOGASTRODUODENOSCOPY N/A 07/08/2015   Procedure: ESOPHAGOGASTRODUODENOSCOPY (EGD);  Surgeon: Rogene Houston, MD;  [facility];  Service: Endoscopy;  Laterality: N/A;  . Hernia     Right inguinal  . HERNIA REPAIR  1961   right inguinal hernia  . UPPER GASTROINTESTINAL ENDOSCOPY      There were no vitals filed for this visit.  Subjective Assessment - 07/16/18 1304    Subjective  Reports that she was on her feet a lot over the weekend and this morning.    Pertinent History  Scoliosis, hiatial hernia, osteopenia, allergy to tape    Limitations  Sitting;House hold activities;Standing;Walking    How long can you stand comfortably?  20 minutes    How long can you walk comfortably?  20-30 minutes max    Diagnostic tests  x-ray    Patient Stated Goals  improve ability to perform home activities with less pain    Currently in Pain?  Yes    Pain Score  9  Pain Location  Back    Pain Orientation  Lower    Pain Descriptors / Indicators  Discomfort    Pain Type  Chronic pain    Pain Onset  More than a month ago    Pain Frequency  Constant         [facility] PT Assessment - 07/16/18 0001      Assessment   Medical Diagnosis  spondylosis without myelopathy or radiculopathy    Referring Provider (PT)  Beau Fanny, PA-C    Next MD Visit  End of March    Prior Therapy  yes      Precautions   Precautions  None      Restrictions   Weight Bearing Restrictions  No                   [facility] Adult PT Treatment/Exercise - 07/16/18 0001      Modalities   Modalities  Electrical Stimulation;Moist Heat;Traction      Moist Heat Therapy   Number  Minutes Moist Heat  15 Minutes    Moist Heat Location  Lumbar Spine      Electrical Stimulation   Electrical Stimulation Location  Bilateral Thoracolumbar    Electrical Stimulation Action  IFC    Electrical Stimulation Parameters  80-150 hz x15 min    Electrical Stimulation Goals  Pain      Traction   Type of Traction  Lumbar    Min (lbs)  5    Max (lbs)  70    Hold Time  99    Rest Time  5    Time  15      Manual Therapy   Manual Therapy  Soft tissue mobilization    Soft tissue mobilization  STW to B lumbar paraspinals, QL to promote reduction of pain and tightness in LT sidelying                  PT Long Term Goals - 07/10/18 1417      PT LONG TERM GOAL #1   Title  Patient will be independent with HEP    Baseline       Time  6    Period  Weeks    Status  On-going      PT LONG TERM GOAL #2   Title  Patient will report ability to perfrom ADLs with low back pain less than or equal to 4/10    Baseline       Time  6    Period  Weeks    Status  On-going      PT LONG TERM GOAL #3   Title  Patient will demonstrate 4/5 or greater bilateral LE MMT to improve stability during functional tasks.    Baseline       Time  6    Status  On-going      PT LONG TERM GOAL #4   Title  Patient will report ability to stand for 20 minutes or greater with low back pain less than or equal to 5/10 to improve ability to perform home tasks.     Time  6    Period  Weeks    Status  On-going            Plan - 07/16/18 1339    Clinical Impression Statement  Patient presented in clinic with reports of high level LBP bilaterally. Patient very busy over the weekend and was standing for prolonged amounts of times  this morning as well. Patient reported reduction of muscle tightness and discomfort following manual therapy to B lumbar musculature. Normal modalities response noted following removal of the modalities.    Stability/Clinical Decision Making  Evolving/Moderate complexity     Rehab Potential  Good    PT Frequency  2x / week    PT Duration  6 weeks    PT Treatment/Interventions  Electrical Stimulation;ADLs/Self Care Home Management;Cryotherapy;Traction;Ultrasound;Moist Heat;Iontophoresis 72m/ml Dexamethasone;Gait training;Stair training;Manual techniques;Dry needling;Passive range of motion;Balance training;Neuromuscular re-education;Therapeutic exercise;Therapeutic activities;Patient/family education    PT Next Visit Plan   lumbar traction, STW/M modalities for pain relief.      PT Home Exercise Plan  see patient education section    Consulted and Agree with Plan of Care  Patient       Patient will benefit from skilled therapeutic intervention in order to improve the following deficits and impairments:  Abnormal gait, Pain, Postural dysfunction, Decreased activity tolerance, Decreased range of motion, Decreased strength  Visit Diagnosis: Chronic bilateral low back pain without sciatica  Abnormal posture  Muscle weakness (generalized)     Problem List Patient Active Problem List   Diagnosis Date Noted  . Light headedness 05/09/2018  . Osteopenia of the elderly 02/19/2014  . DDD (degenerative disc disease), lumbar 12/03/2013  . Scoliosis 12/03/2013  . Chronic LBP 05/14/2013  . IBS (irritable bowel syndrome) 11/08/2012  . [place] 04/23[place]2012  . Palpitation 10/26/2011  . Hypertension 10/26/2011  . [place] ulcerative colitis ([place] 03/23/2011    KStandley Brooking PTA 07/16/2018, 2:09 PM  [id number] [address]Phone: [phone]  Fax:  [phone] Name: BTammatha CobbMRN: [id number] of Birth: 8Oct 17, 1937

## 2018-07-19 ENCOUNTER — Other Ambulatory Visit: Payer: Self-pay

## 2018-07-19 ENCOUNTER — Ambulatory Visit: Payer: Medicare Other | Admitting: Physical Therapy

## 2018-07-19 DIAGNOSIS — R293 Abnormal posture: Secondary | ICD-10-CM

## 2018-07-19 DIAGNOSIS — G8929 Other chronic pain: Secondary | ICD-10-CM | POA: Diagnosis not present

## 2018-07-19 DIAGNOSIS — M545 Low back pain: Principal | ICD-10-CM

## 2018-07-19 DIAGNOSIS — M6281 Muscle weakness (generalized): Secondary | ICD-10-CM | POA: Diagnosis not present

## 2018-07-19 NOTE — Therapy (Signed)
[facility] [place], [place], [address] Phone: [phone]   Fax:  [phone]  Physical Therapy Treatment  Patient Details  Name: Lori Soto MRN: [id number] Date of Birth: 12-14-1935 Referring Provider (PT): Beau Fanny, [place]   Encounter Date: 07/19/2018  PT End of Session - 07/19/18 1433    Visit Number  8    Number of Visits  12    Date for PT Re-Evaluation  08/14/18    PT Start Time  8588    PT Stop Time  1531    PT Time Calculation (min)  58 min    Activity Tolerance  Patient tolerated treatment well    Behavior During Therapy  [facility] for tasks assessed/performed       Past Medical History:  Diagnosis Date  . Anemia   . Arthritis   . Cancer ([place])    skin cancer on nose  . Carpal tunnel syndrome, bilateral   . Cataract   . Esophagitis   . GERD (gastroesophageal reflux disease)   . H/O hiatal hernia   . Osteopenia   . Scoliosis   . Shingles   . Ulcerative colitis     Past Surgical History:  Procedure Laterality Date  . ABDOMINAL HYSTERECTOMY    . BUNIONECTOMY     Bilateral  . CARPAL TUNNEL RELEASE Left 03/10/2015   Procedure: LEFT CARPAL TUNNEL RELEASE;  Surgeon: Daryll Brod, MD;  Location: [place];  Service: Orthopedics;  Laterality: Left;  . CARPAL TUNNEL RELEASE Right 10/31/2017   Procedure: RIGHT CARPAL TUNNEL RELEASE;  Surgeon: Daryll Brod, MD;  Location: [place];  Service: Orthopedics;  Laterality: Right;  . CATARACT EXTRACTION W/PHACO  05/28/2012   Procedure: CATARACT EXTRACTION PHACO AND INTRAOCULAR LENS PLACEMENT (IOC);  Surgeon: Tonny Branch, MD;  [facility]: [facility];  Service: Ophthalmology;  Laterality: Right;  CDE=12.84  . CATARACT EXTRACTION W/PHACO  06/07/2012   Procedure: CATARACT EXTRACTION PHACO AND INTRAOCULAR LENS PLACEMENT (IOC);  Surgeon: Tonny Branch, MD;  [facility];  Service: Ophthalmology;  Laterality: Left;  CDE 17.60  . COLONOSCOPY    .  COLONOSCOPY N/A 07/08/2015   Procedure: COLONOSCOPY;  Surgeon: Rogene Houston, MD;  [facility];  Service: Endoscopy;  Laterality: N/A;  930  . ESOPHAGEAL DILATION N/A 07/08/2015   Procedure: ESOPHAGEAL DILATION;  Surgeon: Rogene Houston, MD;  [facility];  Service: Endoscopy;  Laterality: N/A;  . ESOPHAGOGASTRODUODENOSCOPY N/A 07/08/2015   Procedure: ESOPHAGOGASTRODUODENOSCOPY (EGD);  Surgeon: Rogene Houston, MD;  [facility];  Service: Endoscopy;  Laterality: N/A;  . Hernia     Right inguinal  . HERNIA REPAIR  1961   right inguinal hernia  . UPPER GASTROINTESTINAL ENDOSCOPY      There were no vitals filed for this visit.  Subjective Assessment - 07/19/18 1433    Subjective  Reports that she vacuumed this morning for part of her house. Still having to split up ADLs.    Pertinent History  Scoliosis, hiatial hernia, osteopenia, allergy to tape    Limitations  Sitting;House hold activities;Standing;Walking    How long can you stand comfortably?  20 minutes    How long can you walk comfortably?  20-30 minutes max    Diagnostic tests  x-ray    Patient Stated Goals  improve ability to perform home activities with less pain    Currently in Pain?  Yes    Pain Score  8  Pain Location  Back    Pain Orientation  Lower    Pain Descriptors / Indicators  Discomfort    Pain Type  Chronic pain    Pain Onset  More than a month ago         [facility] PT Assessment - 07/19/18 0001      Assessment   Medical Diagnosis  spondylosis without myelopathy or radiculopathy    Referring Provider (PT)  Beau Fanny, PA-C    Next MD Visit  End of March    Prior Therapy  yes      Precautions   Precautions  None      Restrictions   Weight Bearing Restrictions  No                   [facility] Adult PT Treatment/Exercise - 07/19/18 0001      Modalities   Modalities  Electrical Stimulation;Moist Heat;Traction      Moist Heat Therapy   Number Minutes Moist  Heat  15 Minutes    Moist Heat Location  Lumbar Spine      Electrical Stimulation   Electrical Stimulation Location  Bilateral Thoracolumbar    Electrical Stimulation Action  Pre-Mod    Electrical Stimulation Parameters  80-150 hz x15 min    Electrical Stimulation Goals  Pain      Traction   Type of Traction  Lumbar    Min (lbs)  5    Max (lbs)  70    Hold Time  99    Rest Time  5    Time  15      Manual Therapy   Manual Therapy  Soft tissue mobilization    Soft tissue mobilization  STW to B lumbar paraspinals, QL to promote reduction of pain and tightness in LT sidelying                  PT Long Term Goals - 07/10/18 1417      PT LONG TERM GOAL #1   Title  Patient will be independent with HEP    Baseline       Time  6    Period  Weeks    Status  On-going      PT LONG TERM GOAL #2   Title  Patient will report ability to perfrom ADLs with low back pain less than or equal to 4/10    Baseline       Time  6    Period  Weeks    Status  On-going      PT LONG TERM GOAL #3   Title  Patient will demonstrate 4/5 or greater bilateral LE MMT to improve stability during functional tasks.    Baseline       Time  6    Status  On-going      PT LONG TERM GOAL #4   Title  Patient will report ability to stand for 20 minutes or greater with low back pain less than or equal to 5/10 to improve ability to perform home tasks.     Time  6    Period  Weeks    Status  On-going            Plan - 07/19/18 1800    Clinical Impression Statement  Patient presented in clinic with reports of increased LBP today from ADLs around her home such as vacuuming small area within her home. Patient limited with duration of ADLs due to pain  that presents with prolonged activity and ambulation. Patient reported tenderness to manual therapy to R QL. Normal modalities response noted following removal of the modalities.    Stability/Clinical Decision Making  Evolving/Moderate complexity     Rehab Potential  Good    PT Frequency  2x / week    PT Duration  6 weeks    PT Treatment/Interventions  Electrical Stimulation;ADLs/Self Care Home Management;Cryotherapy;Traction;Ultrasound;Moist Heat;Iontophoresis 72m/ml Dexamethasone;Gait training;Stair training;Manual techniques;Dry needling;Passive range of motion;Balance training;Neuromuscular re-education;Therapeutic exercise;Therapeutic activities;Patient/family education    PT Next Visit Plan   lumbar traction, STW/M modalities for pain relief.      PT Home Exercise Plan  see patient education section    Consulted and Agree with Plan of Care  Patient       Patient will benefit from skilled therapeutic intervention in order to improve the following deficits and impairments:  Abnormal gait, Pain, Postural dysfunction, Decreased activity tolerance, Decreased range of motion, Decreased strength  Visit Diagnosis: Chronic bilateral low back pain without sciatica  Abnormal posture  Muscle weakness (generalized)     Problem List Patient Active Problem List   Diagnosis Date Noted  . Light headedness 05/09/2018  . Osteopenia of the elderly 02/19/2014  . DDD (degenerative disc disease), lumbar 12/03/2013  . Scoliosis 12/03/2013  . Chronic LBP 05/14/2013  . IBS (irritable bowel syndrome) 11/08/2012  . UC (ulcerative colitis) ([place] 04/23/2012  . Palpitation 10/26/2011  . Hypertension 10/26/2011  . Chronic ulcerative colitis ([place] 03/23/2011    KStandley Brooking PTA 07/19/2018, 6:06 PM  [facility] [address] [address]Phone: [phone]  Fax:  [phone] Name: BKylii EnnisMRN: [id number] of Birth: [birth year]

## 2018-07-21 ENCOUNTER — Other Ambulatory Visit (INDEPENDENT_AMBULATORY_CARE_PROVIDER_SITE_OTHER): Payer: Self-pay | Admitting: Internal Medicine

## 2018-07-23 DIAGNOSIS — M47816 Spondylosis without myelopathy or radiculopathy, lumbar region: Secondary | ICD-10-CM | POA: Diagnosis not present

## 2018-07-23 DIAGNOSIS — M4186 Other forms of scoliosis, lumbar region: Secondary | ICD-10-CM | POA: Diagnosis not present

## 2018-08-07 ENCOUNTER — Ambulatory Visit: Payer: Medicare Other | Admitting: Physical Therapy

## 2018-08-08 ENCOUNTER — Other Ambulatory Visit: Payer: Self-pay

## 2018-08-08 ENCOUNTER — Ambulatory Visit (INDEPENDENT_AMBULATORY_CARE_PROVIDER_SITE_OTHER): Payer: Medicare Other | Admitting: Family Medicine

## 2018-08-08 ENCOUNTER — Encounter: Payer: Self-pay | Admitting: Family Medicine

## 2018-08-08 DIAGNOSIS — E559 Vitamin D deficiency, unspecified: Secondary | ICD-10-CM | POA: Diagnosis not present

## 2018-08-08 DIAGNOSIS — K5100 Ulcerative (chronic) pancolitis without complications: Secondary | ICD-10-CM | POA: Diagnosis not present

## 2018-08-08 DIAGNOSIS — I10 Essential (primary) hypertension: Secondary | ICD-10-CM | POA: Diagnosis not present

## 2018-08-08 DIAGNOSIS — M5136 Other intervertebral disc degeneration, lumbar region: Secondary | ICD-10-CM

## 2018-08-08 DIAGNOSIS — R002 Palpitations: Secondary | ICD-10-CM

## 2018-08-08 DIAGNOSIS — K589 Irritable bowel syndrome without diarrhea: Secondary | ICD-10-CM

## 2018-08-08 DIAGNOSIS — M419 Scoliosis, unspecified: Secondary | ICD-10-CM | POA: Diagnosis not present

## 2018-08-08 NOTE — Progress Notes (Signed)
Virtual Visit via telephone Note I connected with@ on 08/08/18 by telephone and verified that I am speaking with the correct person or authorized healthcare agent using two identifiers. Lori Soto is currently located at home and there are no unauthorized people in close proximity. I, Redge Gainer, MD, completed this visit while in a private location in my  office.  This visit type was conducted due to national recommendations for restrictions regarding the COVID-19 Pandemic (e.g. social distancing).  This format is felt to be most appropriate for this patient at this time.  All issues noted in this document were discussed and addressed.  No physical exam was performed.    I discussed the limitations, risks, security and privacy concerns of performing an evaluation and management service by telephone and the availability of in person appointments. I also discussed with the patient that there may be a patient responsible charge related to this service. The patient expressed understanding and agreed to proceed.   Date:  08/08/2018    ID:  Lori Soto      03-11-1936        [id number]   Patient Care Team Patient Care Team: Chipper Herb, MD as PCP - General (Family Medicine) Rogene Houston, MD as Consulting Physician (Gastroenterology) Gaynelle Arabian, MD as Consulting Physician (Orthopedic Surgery) Daryll Brod, MD as Consulting Physician (Orthopedic Surgery) Minus Breeding, MD as Consulting Physician (Cardiology) Particia Nearing, OD [facility])  Reason for Visit: Primary Care Follow-up     History of Present Illness & Review of Systems:     Lori Soto is a 83 y.o. year old female primary care patient that presents today for a telehealth visit.  The telephone visit today was done for routine follow-up of this patient.  She is also followed by cardiology gastroenterology.  The patient currently does not need any refills.  She is followed regularly by cardiology  gastroenterology and orthopedics.  Review of systems as stated otherwise negative for body systems left unmentioned.   The patient does not have symptoms concerning for COVID-19 infection (fever, chills, cough, or new shortness of breath).      Current Medications (Verified) Allergies as of 08/08/2018      Reactions   Penicillins Itching, Swelling   Patient states that she had a rash also Has patient had a PCN reaction causing immediate rash, facial/tongue/throat swelling, SOB or lightheadedness with hypotension: No Has patient had a PCN reaction causing severe rash involving mucus membranes or skin necrosis: No Has patient had a PCN reaction that required hospitalization No Has patient had a PCN reaction occurring within the last 10 years: No If all of the above answers are "NO", then may proceed with Cephalosporin use.   Tape Itching   Influenza Virus Vacc Split Pf Rash   Per Patient she was told by her PCP not to ever take this injection again      Medication List       Accurate as of August 08, 2018  8:59 AM. Always use your most recent med list.        Bromelains 500 MG Tabs Take 500 mg by mouth daily.   Calcium Carbonate-Vitamin D 600-400 MG-UNIT tablet Commonly known as:  Calcium 600+D Take 1 tablet by mouth every evening.   Cayenne 500 MG Caps Take 1 capsule by mouth daily.   CO Q-10 PO Take 1 tablet by mouth daily. With red yeast rice [employer] Brand   COLLAGEN  PO Take 1 tablet by mouth daily. This is known as 1-2-3   CRANBERRY CONCENTRATE PO Take 2,500 mg by mouth daily.   Ginkgo Biloba 40 MG Tabs Take 40 mg by mouth daily.   glucosamine-chondroitin 500-400 MG tablet Take 1 tablet by mouth 2 (two) times daily.   GRAPESEED EXTRACT PO Take 60 mg by mouth daily.   HYALURONIC ACID PO Take 80 mg by mouth daily.   Mesalamine 400 MG Cpdr DR capsule Commonly known as:  ASACOL Take 2 capsules (800 mg total) by mouth daily.   MILK THISTLE PO Take 100  mg by mouth daily.   MULTIPLE VITAMINS PO Take 1 tablet by mouth daily. In the mornings   OAT BRAN SOLUBLE PO Take 1 tablet by mouth daily.   OVER THE COUNTER MEDICATION Take 250 mg by mouth daily. Alphalipoic Acid 250 mg daily   OVER THE COUNTER MEDICATION Take 66 mg by mouth daily. Vision Gold Lutein   pantoprazole 40 MG tablet Commonly known as:  PROTONIX TAKE 1 TABLET DAILY BEFORE BREAKFAST   POTASSIUM GLUCONATE PO Take 99 mg by mouth daily. This is Chelated Potassium -[employer] Pride   pyridOXINE 100 MG tablet Commonly known as:  VITAMIN B-6 Take 100 mg by mouth daily.   Turmeric 500 MG Caps Take 1 capsule by mouth 3 (three) times daily.   Tylenol 8 Hour Arthritis Pain 650 MG CR tablet Generic drug:  acetaminophen Take 650 mg by mouth every 8 (eight) hours as needed. Patient states that she takes prn for arthritis in her knees.   VITAMIN B 12 PO Take 1,000 mcg by mouth daily.   Zinc 50 MG Caps Take 50 mg by mouth daily.           Allergies (Verified)    Penicillins; Tape; and Influenza virus vacc split pf  Past Medical History Past Medical History:  Diagnosis Date   Anemia    Arthritis    Cancer ([place])    skin cancer on nose   Carpal tunnel syndrome, bilateral    Cataract    Esophagitis    GERD (gastroesophageal reflux disease)    H/O hiatal hernia    Osteopenia    Scoliosis    Shingles    Ulcerative colitis      Past Surgical History:  Procedure Laterality Date   ABDOMINAL HYSTERECTOMY     BUNIONECTOMY     Bilateral   CARPAL TUNNEL RELEASE Left 03/10/2015   Procedure: LEFT CARPAL TUNNEL RELEASE;  Surgeon: Daryll Brod, MD;  Location: [place];  Service: Orthopedics;  Laterality: Left;   CARPAL TUNNEL RELEASE Right 10/31/2017   Procedure: RIGHT CARPAL TUNNEL RELEASE;  Surgeon: Daryll Brod, MD;  Location: [place];  Service: Orthopedics;  Laterality: Right;   CATARACT EXTRACTION W/PHACO  05/28/2012     Procedure: CATARACT EXTRACTION PHACO AND INTRAOCULAR LENS PLACEMENT (IOC);  Surgeon: Tonny Branch, MD;  [facility];  Service: Ophthalmology;  Laterality: Right;  CDE=12.84   CATARACT EXTRACTION W/PHACO  06/07/2012   Procedure: CATARACT EXTRACTION PHACO AND INTRAOCULAR LENS PLACEMENT (IOC);  Surgeon: Tonny Branch, MD;  Location: [facility];  Service: Ophthalmology;  Laterality: Left;  CDE 17.60   COLONOSCOPY     COLONOSCOPY N/A 07/08/2015   Procedure: COLONOSCOPY;  Surgeon: Rogene Houston, MD;  [facility];  Service: Endoscopy;  Laterality: N/A;  930   ESOPHAGEAL DILATION N/A 07/08/2015   Procedure: ESOPHAGEAL DILATION;  Surgeon: Rogene Houston, MD;  [facility];  Service: Endoscopy;  Laterality: N/A;   ESOPHAGOGASTRODUODENOSCOPY N/A 07/08/2015   Procedure: ESOPHAGOGASTRODUODENOSCOPY (EGD);  Surgeon: Rogene Houston, MD;  [facility];  Service: Endoscopy;  Laterality: N/A;   Hernia     Right inguinal   HERNIA REPAIR  1961   right inguinal hernia   UPPER GASTROINTESTINAL ENDOSCOPY      Social History   Socioeconomic History   Marital status: Widowed    Spouse name: Not on file   Number of children: 3   Years of education: Not on file   Highest education level: Not on file  Occupational History    Employer: RETIRED    Comment: accounting  Social Designer, fashion/clothing strain: Not on file   Food insecurity:    Worry: Not on file    Inability: Not on file   Transportation needs:    Medical: Not on file    Non-medical: Not on file  Tobacco Use   Smoking status: Never Smoker   Smokeless tobacco: Never Used   Tobacco comment: Never smoker  Substance and Sexual Activity   Alcohol use: Yes    Alcohol/week: 0.0 standard drinks    Comment: Very Rarely   Drug use: No   Sexual activity: Not Currently    Birth control/protection: Surgical  Lifestyle   Physical activity:    Days per week: Not on file    Minutes per session:  Not on file   Stress: Not on file  Relationships   Social connections:    Talks on phone: Not on file    Gets together: Not on file    Attends religious service: Not on file    Active member of club or organization: Not on file    Attends meetings of clubs or organizations: Not on file    Relationship status: Not on file  Other Topics Concern   Not on file  Social History Narrative   Lives alone.     Family History  Problem Relation Age of Onset   Arthritis Mother    Heart disease Mother    Cancer Father        pancreat   Pancreatic cancer Father    Arthritis Father        back issues    Cancer Brother        lung and liver   Liver cancer Brother    Lung cancer Brother    Heart disease Sister        Rheumatic Fever   Peptic Ulcer Sister    Diabetes Sister    Colon cancer Sister    Edema Sister    GI problems Sister        diverticulitis   Diabetes Sister    Neuropathy Sister    COPD Sister    Hiatal hernia Sister    GI problems Sister       Labs/Other Tests and Data Reviewed:    Wt Readings from Last 3 Encounters:  05/09/18 135 lb (61.2 kg)  03/14/18 139 lb (63 kg)  02/27/18 136 lb 12.8 oz (62.1 kg)   Temp Readings from Last 3 Encounters:  03/14/18 (!) 97.5 F (36.4 C) (Oral)  02/27/18 98.3 F (36.8 C) (Oral)  12/02/17 97.9 F (36.6 C) (Oral)   BP Readings from Last 3 Encounters:  05/09/18 130/84  03/14/18 (!) 143/72  02/27/18 110/84   Pulse Readings from Last 3 Encounters:  05/09/18 80  03/14/18 (!) 59  02/27/18 64     No results found for: HGBA1C Lab Results  Component Value Date   LDLCALC 115 (H) 03/14/2018   CREATININE 0.82 04/11/2018       Chemistry      Component Value Date/Time   NA 141 04/11/2018 1616   K 4.3 04/11/2018 1616   CL 102 04/11/2018 1616   CO2 27 04/11/2018 1616   BUN 15 04/11/2018 1616   CREATININE 0.82 04/11/2018 1616   CREATININE 0.69 05/14/2013 1239      Component Value Date/Time    CALCIUM 9.5 04/11/2018 1616   ALKPHOS 37 (L) 03/14/2018 1147   AST 22 03/14/2018 1147   ALT 17 03/14/2018 1147   BILITOT 0.4 03/14/2018 1147         OBSERVATIONS/ OBJECTIVE:     The patient is pleasant and doing well and lives by herself.  She recently had a lot of stress with the loss of her niece that was 73 years old.  She did have some problems with palpitations following the stress surrounding that situation.  She has had that worked up thoroughly by the cardiologist.  She does have a six-month follow-up visit at some point in the future with the cardiologist.  Today she denies any chest pain pressure tightness or shortness of breath.  She only had the one episode of palpitations following the stress associated with the death of her niece.  She denies any trouble with swallowing heartburn indigestion nausea or vomiting or blood in the stool or change in bowel habits.  With her colitis she is having a few more loose stools associated with the generic version of Asacol.  She has not seen any blood in the stool.  She takes her Protonix on an as needed basis and that seems to control her heartburn.  She is passing her water without problems and trying to drink plenty of fluids.  She has her ongoing back issues and currently that seems to be under better control following a treatment with her orthopedic surgeon and getting some rehab treatment in the office next door.  She does complain of some nasal congestion and allergies and is currently taking Mucinex and we encouraged her to use some Flonase over-the-counter 1 spray each nostril and some nasal saline.  We also encouraged her to come in in a couple of months and get lab work done.  She has an upcoming eye exam.  Physical exam deferred due to nature of telephonic visit.  ASSESSMENT & PLAN    Time:   Today, I have spent 30 minutes with the patient via telephone discussing the above including Covid precautions.     Visit Diagnoses: 1. DDD  (degenerative disc disease), lumbar -Follow-up with Dr. Leonor Liv as needed.  2. Ulcerative pancolitis without complication ([place]) -Follow-up with Dr. Collene Mares as needed  3. Scoliosis of thoracic spine, unspecified scoliosis type -Follow-up with Dr. Leonor Liv as needed for further treatments as this is helped some with controlling her pain.  4. Vitamin D deficiency -Continue with vitamin D replacement  5. Palpitations -Follow-up with cardiology as needed.  She has been worked up thoroughly for this and palpitations seem to occur most often following a period of stress and overdoing it.  She still drinks coffee half-and-half once a day.  6. Essential hypertension -No issues with blood pressure  7. Irritable bowel syndrome, unspecified type -Follow-up with Dr. Melony Overly as needed and he can adjust medication if loose stools continue  8.  Allergic rhinitis -Use  nasal saline and try Flonase over-the-counter 1 spray each nostril daily at bedtime -Continue to drink plenty of fluids and stay well-hydrated    Follow-up Instructions:  Patient Instructions  Continue to drink plenty of fluids Continue to practice good respiratory and hand hygiene in the midst of Covid 19. Avoid crowds of people Always be careful and do not put yourself at risk for falling and do not climb on ladders Keep the house well lit Stay active physically and get exercise with weightbearing walking. Avoid crowds of people Return to this office for visit in 4 months and we will get lab work at that time Follow-up with cardiology and gastroenterology as planned Follow-up with orthopedist as needed Try to get as much rest as possible but walk and exercise regularly    Continue current medications. Continue good therapeutic lifestyle changes which include good diet and exercise. Fall precautions discussed with patient. If an FOBT was given today- please return it to our front desk. If you are over 93 years old - you may need  Prevnar 32 or the adult Pneumonia vaccine.  **Flu shots are available--- please call and schedule a FLU-CLINIC appointment**  After your visit with [place] today you will receive a survey in the mail or online from [employer] regarding your care with [place]. Please take a moment to fill this out. Your feedback is very important to [place] as you can help [place] better understand your patient needs as well as improve your experience and satisfaction. WE CARE ABOUT YOU!!!     I discussed the assessment and treatment plan with the patient. The patient was provided an opportunity to ask questions and all were answered. The patient agreed with the plan and demonstrated an understanding of the instructions.   The patient was advised to call back or seek an in-person evaluation if the symptoms worsen or if the condition fails to improve as anticipated.  The above assessment and management plan was discussed with the patient. The patient verbalized understanding of and has agreed to the management plan. Patient is aware to call the clinic if symptoms persist or worsen. Patient is aware when to return to the clinic for a follow-up visit. Patient educated on when it is appropriate to go to the emergency department.    Chipper Herb, MD [place], [place], [place] [address] Ph [phone]   Arrie Senate MD

## 2018-08-08 NOTE — Patient Instructions (Addendum)
Continue to drink plenty of fluids Continue to practice good respiratory and hand hygiene in the midst of Covid 19. Avoid crowds of people Always be careful and do not put yourself at risk for falling and do not climb on ladders Keep the house well lit Stay active physically and get exercise with weightbearing walking. Avoid crowds of people Return to this office for visit in 4 months and we will get lab work at that time Follow-up with cardiology and gastroenterology as planned Follow-up with orthopedist as needed Try to get as much rest as possible but walk and exercise regularly

## 2018-08-08 NOTE — Addendum Note (Signed)
Addended by: Zannie Cove on: 08/08/2018 09:51 AM   Modules accepted: Orders

## 2018-08-09 ENCOUNTER — Encounter: Payer: Medicare Other | Admitting: Physical Therapy

## 2018-08-24 ENCOUNTER — Encounter (HOSPITAL_COMMUNITY): Payer: Self-pay | Admitting: Emergency Medicine

## 2018-08-24 ENCOUNTER — Emergency Department (HOSPITAL_COMMUNITY): Payer: Medicare Other

## 2018-08-24 ENCOUNTER — Other Ambulatory Visit: Payer: Self-pay

## 2018-08-24 ENCOUNTER — Emergency Department (HOSPITAL_COMMUNITY)
Admission: EM | Admit: 2018-08-24 | Discharge: 2018-08-24 | Disposition: A | Discharge status: Home / Self Care | Payer: Medicare Other | Attending: Emergency Medicine | Admitting: Emergency Medicine

## 2018-08-24 DIAGNOSIS — I471 Supraventricular tachycardia: Secondary | ICD-10-CM | POA: Diagnosis not present

## 2018-08-24 DIAGNOSIS — I10 Essential (primary) hypertension: Secondary | ICD-10-CM | POA: Diagnosis not present

## 2018-08-24 DIAGNOSIS — Z85828 Personal history of other malignant neoplasm of skin: Secondary | ICD-10-CM | POA: Diagnosis not present

## 2018-08-24 DIAGNOSIS — K449 Diaphragmatic hernia without obstruction or gangrene: Secondary | ICD-10-CM | POA: Diagnosis not present

## 2018-08-24 DIAGNOSIS — R000 Tachycardia, unspecified: Secondary | ICD-10-CM | POA: Diagnosis present

## 2018-08-24 LAB — CBC WITH DIFFERENTIAL/PLATELET
Abs Immature Granulocytes: 0.01 10*3/uL (ref 0.00–0.07)
Basophils Absolute: 0 10*3/uL (ref 0.0–0.1)
Basophils Relative: 1 %
Eosinophils Absolute: 0.1 10*3/uL (ref 0.0–0.5)
Eosinophils Relative: 1 %
HCT: 40.8 % (ref 36.0–46.0)
Hemoglobin: 13.4 g/dL (ref 12.0–15.0)
Immature Granulocytes: 0 %
Lymphocytes Relative: 24 %
Lymphs Abs: 1.3 10*3/uL (ref 0.7–4.0)
MCH: 30 pg (ref 26.0–34.0)
MCHC: 32.8 g/dL (ref 30.0–36.0)
MCV: 91.5 fL (ref 80.0–100.0)
Monocytes Absolute: 0.5 10*3/uL (ref 0.1–1.0)
Monocytes Relative: 9 %
Neutro Abs: 3.7 10*3/uL (ref 1.7–7.7)
Neutrophils Relative %: 65 %
Platelets: 240 10*3/uL (ref 150–400)
RBC: 4.46 MIL/uL (ref 3.87–5.11)
RDW: 13.8 % (ref 11.5–15.5)
WBC: 5.6 10*3/uL (ref 4.0–10.5)
nRBC: 0 % (ref 0.0–0.2)

## 2018-08-24 LAB — COMPREHENSIVE METABOLIC PANEL
ALT: 18 U/L (ref 0–44)
AST: 21 U/L (ref 15–41)
Albumin: 4 g/dL (ref 3.5–5.0)
Alkaline Phosphatase: 31 U/L — ABNORMAL LOW (ref 38–126)
Anion gap: 9 (ref 5–15)
BUN: 13 mg/dL (ref 8–23)
CO2: 26 mmol/L (ref 22–32)
Calcium: 9.3 mg/dL (ref 8.9–10.3)
Chloride: 104 mmol/L (ref 98–111)
Creatinine, Ser: 0.74 mg/dL (ref 0.44–1.00)
GFR calc Af Amer: >60 mL/min (ref >60)
GFR calc non Af Amer: >60 mL/min (ref >60)
Glucose, Bld: 110 mg/dL — ABNORMAL HIGH (ref 70–99)
Potassium: 4.4 mmol/L (ref 3.5–5.1)
Sodium: 139 mmol/L (ref 135–145)
Total Bilirubin: 0.6 mg/dL (ref 0.3–1.2)
Total Protein: 6.8 g/dL (ref 6.5–8.1)

## 2018-08-24 LAB — MAGNESIUM: Magnesium: 1.9 mg/dL (ref 1.7–2.4)

## 2018-08-24 LAB — TSH: TSH: 0.829 u[IU]/mL (ref 0.350–4.500)

## 2018-08-24 MED ORDER — SODIUM CHLORIDE 0.9 % IV BOLUS
500.0000 mL | Freq: Once | INTRAVENOUS | Status: AC
  Administered 2018-08-24: 500 mL via INTRAVENOUS

## 2018-08-24 MED ORDER — ADENOSINE 6 MG/2ML IV SOLN
6.0000 mg | Freq: Once | INTRAVENOUS | Status: AC
  Administered 2018-08-24: 6 mg via INTRAVENOUS

## 2018-08-24 MED ORDER — ADENOSINE 6 MG/2ML IV SOLN
INTRAVENOUS | Status: AC
  Filled 2018-08-24: qty 10

## 2018-08-24 NOTE — ED Provider Notes (Signed)
Emergency Department Provider Note   I have reviewed the triage vital signs and the nursing notes.   HISTORY  Chief Complaint Tachycardia   HPI Lori Soto is a 83 y.o. female with PMH of Anemia, GERD, UC, and Palpitations resents to the emergency department for evaluation of acute onset heart palpitations with generalized weakness.  She reports that symptoms began approximately 2 hours ago.  She took her blood pressure at home and found it to be low with the top number of 89.  She denies any chest pain but does note some mild tightness in the chest.  She states she has had multiple episodes similar to this in the past but states by the time she seeks care symptoms have resolved.  She denies any new medications.  No fevers or chills.  Denies significant shortness of breath. No lightheadedness.    Past Medical History:  Diagnosis Date  . Anemia   . Arthritis   . Cancer ([place])    skin cancer on nose  . Carpal tunnel syndrome, bilateral   . Cataract   . Esophagitis   . GERD (gastroesophageal reflux disease)   . H/O hiatal hernia   . Osteopenia   . Scoliosis   . Shingles   . Ulcerative colitis     Patient Active Problem List   Diagnosis Date Noted  . Light headedness 05/09/2018  . Osteopenia of the elderly 02/19/2014  . DDD (degenerative disc disease), lumbar 12/03/2013  . Scoliosis 12/03/2013  . Chronic LBP 05/14/2013  . IBS (irritable bowel syndrome) 11/08/2012  . UC (ulcerative colitis) ([place]) 04/23/2012  . Palpitation 10/26/2011  . Hypertension 10/26/2011  . Chronic ulcerative colitis ([place]) 03/23/2011    Past Surgical History:  Procedure Laterality Date  . ABDOMINAL HYSTERECTOMY    . BUNIONECTOMY     Bilateral  . CARPAL TUNNEL RELEASE Left 03/10/2015   Procedure: LEFT CARPAL TUNNEL RELEASE;  Surgeon: Daryll Brod, MD;  Location: [place];  Service: Orthopedics;  Laterality: Left;  . CARPAL TUNNEL RELEASE Right 10/31/2017   Procedure: RIGHT  CARPAL TUNNEL RELEASE;  Surgeon: Daryll Brod, MD;  Location: [place];  Service: Orthopedics;  Laterality: Right;  . CATARACT EXTRACTION W/PHACO  05/28/2012   Procedure: CATARACT EXTRACTION PHACO AND INTRAOCULAR LENS PLACEMENT (IOC);  Surgeon: Tonny Branch, MD;  Location: [facility];  Service: Ophthalmology;  Laterality: Right;  CDE=12.84  . CATARACT EXTRACTION W/PHACO  06/07/2012   Procedure: CATARACT EXTRACTION PHACO AND INTRAOCULAR LENS PLACEMENT (IOC);  Surgeon: Tonny Branch, MD;  Location: [facility];  Service: Ophthalmology;  Laterality: Left;  CDE 17.60  . COLONOSCOPY    . COLONOSCOPY N/A 07/08/2015   Procedure: COLONOSCOPY;  Surgeon: Rogene Houston, MD;  [facility];  Service: Endoscopy;  Laterality: N/A;  930  . ESOPHAGEAL DILATION N/A 07/08/2015   Procedure: ESOPHAGEAL DILATION;  Surgeon: Rogene Houston, MD;  [facility];  Service: Endoscopy;  Laterality: N/A;  . ESOPHAGOGASTRODUODENOSCOPY N/A 07/08/2015   Procedure: ESOPHAGOGASTRODUODENOSCOPY (EGD);  Surgeon: Rogene Houston, MD;  [facility];  Service: Endoscopy;  Laterality: N/A;  . Hernia     Right inguinal  . HERNIA REPAIR  1961   right inguinal hernia  . UPPER GASTROINTESTINAL ENDOSCOPY      Allergies Penicillins; Tape; and Influenza virus vacc split pf  Family History  Problem Relation Age of Onset  . Arthritis Mother   . Heart disease Mother   . Cancer Father  pancreat  . Pancreatic cancer Father   . Arthritis Father        back issues   . Cancer Brother        lung and liver  . Liver cancer Brother   . Lung cancer Brother   . Heart disease Sister        Rheumatic Fever  . Peptic Ulcer Sister   . Diabetes Sister   . Colon cancer Sister   . Edema Sister   . GI problems Sister        diverticulitis  . Diabetes Sister   . Neuropathy Sister   . COPD Sister   . Hiatal hernia Sister   . GI problems Sister     Social History Social History   Tobacco Use  .  Smoking status: Never Smoker  . Smokeless tobacco: Never Used  . Tobacco comment: Never smoker  Substance Use Topics  . Alcohol use: Yes    Alcohol/week: 0.0 standard drinks    Comment: Very Rarely  . Drug use: No    Review of Systems  Constitutional: No fever/chills Eyes: No visual changes. ENT: No sore throat. Cardiovascular: Positive mild chest tightness. Positive palpitations.  Respiratory: Denies shortness of breath. Gastrointestinal: No abdominal pain.  No nausea, no vomiting.  No diarrhea.  No constipation. Genitourinary: Negative for dysuria. Musculoskeletal: Negative for back pain. Skin: Negative for rash. Neurological: Negative for headaches, focal weakness or numbness.  10-point ROS otherwise negative.  ____________________________________________   PHYSICAL EXAM:  VITAL SIGNS: ED Triage Vitals  Enc Vitals Group     BP 08/24/18 1705 (!) 120/95     Pulse Rate 08/24/18 1705 (!) 175     Resp 08/24/18 1705 20     Temp 08/24/18 1705 97.7 F (36.5 C)     Temp Source 08/24/18 1705 Oral     SpO2 08/24/18 1705 100 %     Weight 08/24/18 1703 132 lb (59.9 kg)     Height 08/24/18 1703 5' (1.524 m)   Constitutional: Alert and oriented. Well appearing and in no acute distress. Eyes: Conjunctivae are normal.  Head: Atraumatic. Nose: No congestion/rhinnorhea. Mouth/Throat: Mucous membranes are moist. Neck: No stridor.  Cardiovascular: SVT with rate 170s. Good peripheral circulation. Grossly normal heart sounds.   Respiratory: Normal respiratory effort.  No retractions. Lungs CTAB. Gastrointestinal: Soft and nontender. No distention.  Musculoskeletal: No lower extremity tenderness nor edema. No gross deformities of extremities. Neurologic:  Normal speech and language. No gross focal neurologic deficits are appreciated.  Skin:  Skin is warm, dry and intact. No rash noted.  ____________________________________________   LABS (all labs ordered are listed, but only  abnormal results are displayed)  Labs Reviewed  COMPREHENSIVE METABOLIC PANEL - Abnormal; Notable for the following components:      Result Value   Glucose, Bld 110 (*)    Alkaline Phosphatase 31 (*)    All other components within normal limits  CBC WITH DIFFERENTIAL/PLATELET  MAGNESIUM  TSH   ____________________________________________  EKG Arrival EKG  EKG Interpretation  Date/Time:  Friday August 24 2018 17:03:50 EDT Ventricular Rate:  174 PR Interval:    QRS Duration: 114 QT Interval:  269 QTC Calculation: 458 R Axis:   72 Text Interpretation:  Supraventricular tachycardia Incomplete right bundle branch block ST depression, probably rate related No STEMI.  Confirmed by Nanda Quinton [phone]) on 08/24/2018 5:21:09 PM      EKG after Adenosine   EKG Interpretation  Date/Time:  Friday August 24 2018 17:13:31 EDT Ventricular Rate:  90 PR Interval:    QRS Duration: 73 QT Interval:  345 QTC Calculation: 423 R Axis:   59 Text Interpretation:  Sinus arrhythmia Probable left atrial enlargement Minimal ST depression, lateral leads similar to Aug 2019 tracing.  No STEMI.  Confirmed by Nanda Quinton [phone]) on 08/24/2018 5:21:53 PM      _________________________________  RADIOLOGY  Dg Chest Portable 1 View  Result Date: 08/24/2018 CLINICAL DATA:  Chest pressure EXAM: PORTABLE CHEST 1 VIEW COMPARISON:  06/09/2016 FINDINGS: Large hiatal hernia, limits evaluation of cardiac size. The heart is probably enlarged. Suspected airspace disease at the left base. No pneumothorax. IMPRESSION: 1. Large hiatal hernia 2. Suspected cardiomegaly. Possible airspace disease at the left base. Electronically Signed   By: Donavan Foil M.D.   On: 08/24/2018 17:59    ____________________________________________   PROCEDURES  Procedure(s) performed:   .Cardioversion Date/Time: 08/24/2018 5:22 PM Performed by: Margette Fast, MD Authorized by: Margette Fast, MD   Consent:    Consent  obtained:  Verbal   Consent given by:  Patient   Risks discussed:  Death, induced arrhythmia and pain   Alternatives discussed:  Alternative treatment Pre-procedure details:    Cardioversion basis:  Emergent   Rhythm:  Supraventricular tachycardia   Electrode placement:  Anterior-posterior Patient sedated: No Attempt one:    Cardioversion mode attempt one: Adenosine (56m)   Shock outcome:  Conversion to normal sinus rhythm (No shock. ) Post-procedure details:    Patient status:  Awake   Patient tolerance of procedure:  Tolerated well, no immediate complications .Critical Care Performed by: LMargette Fast MD Authorized by: LMargette Fast MD   Critical care provider statement:    Critical care time (minutes):  35   Critical care time was exclusive of:  Separately billable procedures and treating other patients and teaching time   Critical care was necessary to treat or prevent imminent or life-threatening deterioration of the following conditions:  Circulatory failure   Critical care was time spent personally by me on the following activities:  Blood draw for specimens, development of treatment plan with patient or surrogate, discussions with primary provider, evaluation of patient's response to treatment, examination of patient, ordering and performing treatments and interventions, ordering and review of laboratory studies, ordering and review of radiographic studies, re-evaluation of patient's condition, pulse oximetry and review of old charts   I assumed direction of critical care for this patient from another provider in my specialty: no       ____________________________________________   INITIAL IMPRESSION / [place]/ ED COURSE  Pertinent labs & imaging results that were available during my care of the patient were reviewed by me and considered in my medical decision making (see chart for details).   Patient presents to the emergency department with acute onset  heart palpitations.  On arrival the patient is in SVT with a rate in the 170s to 180s.  No hypotension.  No concern for infectious etiology.  Attempted vagal maneuvers at bedside without success.  Discussed the plan for adenosine and resulting cardioversion.  Patient gives verbal consent.  Patient converted to normal sinus rhythm after 6 mg of adenosine.  No prolonged pause, chest compressions, pacing, or other complication. Plan for screening labs, CXR, IVF, and reassess.  05:30 PM  Patient resting comfortably on reassessment.  She is feeling much better and maintaining sinus rhythm.  Discussed plan for lab work, IV fluids, chest x-ray.  Patient will ultimately require cardiology follow-up as an outpatient if lab work here is unremarkable.  06:30 PM  She continues to feel well.  Lab work reviewed including TSH which is normal.  No electrolyte abnormality.  Patient does have questionable infiltrate at the left base but no symptoms consistent with pneumonia.  No antibiotics at this time.  I discussed this finding with the patient along with ED return precautions.  She has been up and ambulatory to the bathroom without symptoms.  She sees Dr. Percival Spanish with cardiology and I advised that she give him a call on Monday to schedule a follow-up appointment either in person or by phone.  No medications from the ED at this time.  Patient comfortable with the plan at discharge. ____________________________________________  FINAL CLINICAL IMPRESSION(S) / ED DIAGNOSES  Final diagnoses:  SVT (supraventricular tachycardia) (HCC)    MEDICATIONS GIVEN DURING THIS VISIT:  Medications  adenosine (ADENOCARD) 6 MG/2ML injection 6 mg (6 mg Intravenous Given 08/24/18 1714)  sodium chloride 0.9 % bolus 500 mL (0 mLs Intravenous Stopped 08/24/18 1836)    Note:  This document was prepared using Dragon voice recognition software and may include unintentional dictation errors.  Nanda Quinton, MD Emergency Medicine     Nieves Barberi, Wonda [facility], MD 08/24/18 Bosie Helper

## 2018-08-24 NOTE — ED Notes (Signed)
Called patient's friend and updated as requested by patient. Friend's name is Rise Paganini, number is [phone]. Requested to call if needed and when decide whether to admit patient or not.

## 2018-08-24 NOTE — Discharge Instructions (Signed)
You were seen in the ED today with a rapid heart rate. This was stopped with medication and I would not expect it to return immediately. If your symptoms do return you will need to return to the ED right away. Call your Cardiologist to schedule a follow up appointment either in person or by phone to review your symptoms and ED visit. Drink plenty of water and cut back on caffeine, if you use any.

## 2018-08-24 NOTE — ED Triage Notes (Addendum)
Patient complaining of sudden onset of weakness and fast heart rate starting approximately 1515 today. States B/P was 89 systolic at home. Denies pain.

## 2018-08-26 ENCOUNTER — Other Ambulatory Visit: Payer: Self-pay

## 2018-08-26 ENCOUNTER — Emergency Department (HOSPITAL_COMMUNITY): Payer: Medicare Other

## 2018-08-26 ENCOUNTER — Emergency Department (HOSPITAL_COMMUNITY)
Admission: EM | Admit: 2018-08-26 | Discharge: 2018-08-27 | Disposition: A | Discharge status: Home / Self Care | Payer: Medicare Other | Attending: Emergency Medicine | Admitting: Emergency Medicine

## 2018-08-26 ENCOUNTER — Encounter (HOSPITAL_COMMUNITY): Payer: Self-pay | Admitting: Emergency Medicine

## 2018-08-26 DIAGNOSIS — I10 Essential (primary) hypertension: Secondary | ICD-10-CM | POA: Insufficient documentation

## 2018-08-26 DIAGNOSIS — R002 Palpitations: Secondary | ICD-10-CM | POA: Diagnosis not present

## 2018-08-26 DIAGNOSIS — I471 Supraventricular tachycardia: Secondary | ICD-10-CM | POA: Diagnosis not present

## 2018-08-26 DIAGNOSIS — R000 Tachycardia, unspecified: Secondary | ICD-10-CM | POA: Diagnosis present

## 2018-08-26 HISTORY — DX: Supraventricular tachycardia, unspecified: I47.10

## 2018-08-26 HISTORY — DX: Supraventricular tachycardia: I47.1

## 2018-08-26 LAB — CBC WITH DIFFERENTIAL/PLATELET
Abs Immature Granulocytes: 0.02 10*3/uL (ref 0.00–0.07)
Basophils Absolute: 0.1 10*3/uL (ref 0.0–0.1)
Basophils Relative: 1 %
Eosinophils Absolute: 0.2 10*3/uL (ref 0.0–0.5)
Eosinophils Relative: 2 %
HCT: 45.7 % (ref 36.0–46.0)
Hemoglobin: 15 g/dL (ref 12.0–15.0)
Immature Granulocytes: 0 %
Lymphocytes Relative: 38 %
Lymphs Abs: 3.2 10*3/uL (ref 0.7–4.0)
MCH: 29.7 pg (ref 26.0–34.0)
MCHC: 32.8 g/dL (ref 30.0–36.0)
MCV: 90.5 fL (ref 80.0–100.0)
Monocytes Absolute: 0.7 10*3/uL (ref 0.1–1.0)
Monocytes Relative: 8 %
Neutro Abs: 4.2 10*3/uL (ref 1.7–7.7)
Neutrophils Relative %: 51 %
Platelets: 184 10*3/uL (ref 150–400)
RBC: 5.05 MIL/uL (ref 3.87–5.11)
RDW: 13.8 % (ref 11.5–15.5)
WBC: 8.4 10*3/uL (ref 4.0–10.5)
nRBC: 0 % (ref 0.0–0.2)

## 2018-08-26 LAB — COMPREHENSIVE METABOLIC PANEL
ALT: 19 U/L (ref 0–44)
AST: 27 U/L (ref 15–41)
Albumin: 4.7 g/dL (ref 3.5–5.0)
Alkaline Phosphatase: 36 U/L — ABNORMAL LOW (ref 38–126)
Anion gap: 13 (ref 5–15)
BUN: 12 mg/dL (ref 8–23)
CO2: 23 mmol/L (ref 22–32)
Calcium: 9.7 mg/dL (ref 8.9–10.3)
Chloride: 102 mmol/L (ref 98–111)
Creatinine, Ser: 0.88 mg/dL (ref 0.44–1.00)
GFR calc Af Amer: >60 mL/min (ref >60)
GFR calc non Af Amer: >60 mL/min (ref >60)
Glucose, Bld: 111 mg/dL — ABNORMAL HIGH (ref 70–99)
Potassium: 4 mmol/L (ref 3.5–5.1)
Sodium: 138 mmol/L (ref 135–145)
Total Bilirubin: 0.7 mg/dL (ref 0.3–1.2)
Total Protein: 7.8 g/dL (ref 6.5–8.1)

## 2018-08-26 LAB — TROPONIN I
Troponin I: 0.09 ng/mL (ref <0.03)
Troponin I: 0.07 ng/mL (ref <0.03)
Troponin I: 0.1 ng/mL (ref <0.03)

## 2018-08-26 LAB — CBG MONITORING, ED: Glucose-Capillary: 109 mg/dL — ABNORMAL HIGH (ref 70–99)

## 2018-08-26 MED ORDER — METOPROLOL TARTRATE 25 MG PO TABS: 25.0000 mg | ORAL_TABLET | Freq: Two times a day (BID) | ORAL | 0 refills | Status: DC

## 2018-08-26 MED ORDER — ADENOSINE 6 MG/2ML IV SOLN
INTRAVENOUS | Status: AC
  Administered 2018-08-26: 6 mg
  Filled 2018-08-26: qty 10

## 2018-08-26 MED ORDER — METOPROLOL TARTRATE 25 MG PO TABS
25.0000 mg | ORAL_TABLET | Freq: Once | ORAL | Status: AC
  Administered 2018-08-26: 25 mg via ORAL
  Filled 2018-08-26: qty 1

## 2018-08-26 NOTE — Discharge Instructions (Signed)
I discussed your case with the cardiologist on-call.  He recommended a beta-blocker medicine called metoprolol 25 mg twice a day.  You will need to contact your cardiologist on Monday for an appointment this week.  You can always return to the emergency department if symptoms worsen.

## 2018-08-26 NOTE — ED Provider Notes (Signed)
Patient was pending a 2-hour troponin.  But it was done prematurely.  That first 1 came back at 0.07 but that was at [place].  First troponin at 0.09 was at 1800.  So I repeated one at 2200.  Came back 0.1.  Patient without any chest pain no further SVT.  Chest x-ray was done without any acute findings.  Discussed with cardiology on call which was the fellow at [facility].  And he felt that this was all just stress related troponins.  Recommended adding on a baby aspirin a day.  And giving Dr. Percival Spanish a call for follow-up.  And for patient to return for any develop of any chest pain.   Fredia Sorrow, MD 08/26/18 [phone]

## 2018-08-26 NOTE — ED Notes (Signed)
Successful conversion to sinus rhythm from SVT with Adenocard 10m.  Pt tolerated well.  Daughter updated.

## 2018-08-26 NOTE — ED Provider Notes (Signed)
[facility] EMERGENCY DEPARTMENT Provider Note   CSN: [id number] Arrival date & time: 08/26/18  1749    History   Chief Complaint Chief Complaint  Patient presents with  . Tachycardia    HPI Lori Soto is a 83 y.o. female.     Level 5 caveat for urgent need for intervention.  Patient presents with a rapid heart rate.  She had a similar event 2 nights ago and presented to the [facility] emergency department.  EKG at that time revealed SVT and she responded well to adenosine 6 mg IV.  Otherwise, work-up at that time was negative including TSH.  She has a similar presentation today.  Review of systems negative for chest pain.  She has been evaluated by Dr. Percival Spanish in the past     Past Medical History:  Diagnosis Date  . Anemia   . Arthritis   . Cancer ([place])    skin cancer on nose  . Carpal tunnel syndrome, bilateral   . Cataract   . Esophagitis   . GERD (gastroesophageal reflux disease)   . H/O hiatal hernia   . Osteopenia   . Scoliosis   . Shingles   . SVT (supraventricular tachycardia) ([place])   . Ulcerative colitis     Patient Active Problem List   Diagnosis Date Noted  . Light headedness 05/09/2018  . Osteopenia of the elderly 02/19/2014  . DDD (degenerative disc disease), lumbar 12/03/2013  . Scoliosis 12/03/2013  . Chronic LBP 05/14/2013  . IBS (irritable bowel syndrome) 11/08/2012  . UC (ulcerative colitis) ([place]) 04/23/2012  . Palpitation 10/26/2011  . Hypertension 10/26/2011  . Chronic ulcerative colitis ([place]) 03/23/2011    Past Surgical History:  Procedure Laterality Date  . ABDOMINAL HYSTERECTOMY    . BUNIONECTOMY     Bilateral  . CARPAL TUNNEL RELEASE Left 03/10/2015   Procedure: LEFT CARPAL TUNNEL RELEASE;  Surgeon: Daryll Brod, MD;  Location: [place];  Service: Orthopedics;  Laterality: Left;  . CARPAL TUNNEL RELEASE Right 10/31/2017   Procedure: RIGHT CARPAL TUNNEL RELEASE;  Surgeon: Daryll Brod, MD;  Location: [place];  Service: Orthopedics;  Laterality: Right;  . CATARACT EXTRACTION W/PHACO  05/28/2012   Procedure: CATARACT EXTRACTION PHACO AND INTRAOCULAR LENS PLACEMENT (IOC);  Surgeon: Tonny Branch, MD;  [facility];  Service: Ophthalmology;  Laterality: Right;  CDE=12.84  . CATARACT EXTRACTION W/PHACO  06/07/2012   Procedure: CATARACT EXTRACTION PHACO AND INTRAOCULAR LENS PLACEMENT (IOC);  Surgeon: Tonny Branch, MD;  [facility];  Service: Ophthalmology;  Laterality: Left;  CDE 17.60  . COLONOSCOPY    . COLONOSCOPY N/A 07/08/2015   Procedure: COLONOSCOPY;  Surgeon: Rogene Houston, MD;  [facility];  Service: Endoscopy;  Laterality: N/A;  930  . ESOPHAGEAL DILATION N/A 07/08/2015   Procedure: ESOPHAGEAL DILATION;  Surgeon: Rogene Houston, MD;  [facility];  Service: Endoscopy;  Laterality: N/A;  . ESOPHAGOGASTRODUODENOSCOPY N/A 07/08/2015   Procedure: ESOPHAGOGASTRODUODENOSCOPY (EGD);  Surgeon: Rogene Houston, MD;  [facility];  Service: Endoscopy;  Laterality: N/A;  . Hernia     Right inguinal  . HERNIA REPAIR  1961   right inguinal hernia  . UPPER GASTROINTESTINAL ENDOSCOPY       OB History   No obstetric history on file.      Home Medications    Prior to Admission medications   Medication Sig Start Date End Date Taking? Authorizing Provider  Mesalamine (ASACOL) 400  MG CPDR DR capsule Take 2 capsules (800 mg total) by mouth daily. 02/27/18  Yes Rehman, Mechele Dawley, MD  pantoprazole (PROTONIX) 40 MG tablet TAKE 1 TABLET DAILY BEFORE BREAKFAST Patient taking differently: Take 40 mg by mouth every morning.  07/23/18  Yes Rehman, Mechele Dawley, MD  acetaminophen (TYLENOL 8 HOUR ARTHRITIS PAIN) 650 MG CR tablet Take 650 mg by mouth every 8 (eight) hours as needed. Patient states that she takes prn for arthritis in her knees.     [provider]  Bromelains 500 MG TABS Take 500 mg by mouth daily.    [provider]  Calcium Carbonate-Vitamin D  (CALCIUM 600+D) 600-400 MG-UNIT per tablet Take 1 tablet by mouth every evening. 01/23/15   Cherre Robins, PharmD  Cayenne 500 MG CAPS Take 1 capsule by mouth daily.     [provider]  Coenzyme Q10 (CO Q-10 PO) Take 1 tablet by mouth daily. With red yeast rice [employer]    [provider]  COLLAGEN PO Take 1 tablet by mouth daily. This is known as 1-2-3    [provider]  Cyanocobalamin (VITAMIN B 12 PO) Take 1,000 mcg by mouth daily.     [provider]  Ginkgo Biloba 40 MG TABS Take 40 mg by mouth daily.     [provider]  glucosamine-chondroitin 500-400 MG tablet Take 1 tablet by mouth 2 (two) times daily.      [provider]  Hyaluronic Acid-Vitamin C (HYALURONIC ACID PO) Take 80 mg by mouth daily.    [provider]  MILK THISTLE PO Take 100 mg by mouth daily.    [provider]  MULTIPLE VITAMINS PO Take 1 tablet by mouth daily. In the mornings    [provider]  Nutritional Supplements (GRAPESEED EXTRACT PO) Take 60 mg by mouth daily.     [provider]  OAT BRAN SOLUBLE PO Take 1 tablet by mouth daily.    [provider]  OVER THE COUNTER MEDICATION Take 66 mg by mouth daily. Vision Gold Lutein    [provider]  OVER THE COUNTER MEDICATION Take 250 mg by mouth daily. Alphalipoic Acid 250 mg daily    [provider]  pyridOXINE (VITAMIN B-6) 100 MG tablet Take 100 mg by mouth daily.     [provider]  Turmeric 500 MG CAPS Take 1 capsule by mouth 3 (three) times daily.     [provider]  Vitamins C E (CRANBERRY CONCENTRATE PO) Take 2,500 mg by mouth daily.     [provider]  Zinc 50 MG CAPS Take 50 mg by mouth daily.     [provider]    Family History Family History  Problem Relation Age of Onset  . Arthritis Mother   . Heart disease Mother   . Cancer Father        pancreat  . Pancreatic cancer Father    . Arthritis Father        back issues   . Cancer Brother        lung and liver  . Liver cancer Brother   . Lung cancer Brother   . Heart disease Sister        Rheumatic Fever  . Peptic Ulcer Sister   . Diabetes Sister   . Colon cancer Sister   . Edema Sister   . GI problems Sister        diverticulitis  . Diabetes  Sister   . Neuropathy Sister   . COPD Sister   . Hiatal hernia Sister   . GI problems Sister     Social History Social History   Tobacco Use  . Smoking status: Never Smoker  . Smokeless tobacco: Never Used  . Tobacco comment: Never smoker  Substance Use Topics  . Alcohol use: Yes    Alcohol/week: 0.0 standard drinks    Comment: Very Rarely  . Drug use: No     Allergies   Penicillins; Tape; and Influenza virus vacc split pf   Review of Systems Review of Systems  Unable to perform ROS: Acuity of condition     Physical Exam Updated Vital Signs BP 118/76 (BP Location: Right Arm)   Pulse 65   Temp 98.4 F (36.9 C) (Oral)   Resp 19   Ht 5' (1.524 m)   Wt 59.9 kg   SpO2 94%   BMI 25.78 kg/m   Physical Exam Vitals signs and nursing note reviewed.  Constitutional:      Appearance: She is well-developed.     Comments: nad  HENT:     Head: Normocephalic and atraumatic.  Eyes:     Conjunctiva/sclera: Conjunctivae normal.  Neck:     Musculoskeletal: Neck supple.  Cardiovascular:     Rate and Rhythm: Regular rhythm. Tachycardia present.  Pulmonary:     Effort: Pulmonary effort is normal.     Breath sounds: Normal breath sounds.  Abdominal:     General: Bowel sounds are normal.     Palpations: Abdomen is soft.  Musculoskeletal: Normal range of motion.  Skin:    General: Skin is warm and dry.  Neurological:     Mental Status: She is alert and oriented to person, place, and time.  Psychiatric:        Behavior: Behavior normal.      ED Treatments / Results  Labs (all labs ordered are listed, but only abnormal results are displayed)  Labs Reviewed  COMPREHENSIVE METABOLIC PANEL - Abnormal; Notable for the following components:      Result Value   Glucose, Bld 111 (*)    Alkaline Phosphatase 36 (*)    All other components within normal limits  TROPONIN I - Abnormal; Notable for the following components:   Troponin I 0.09 (*)    All other components within normal limits  CBG MONITORING, ED - Abnormal; Notable for the following components:   Glucose-Capillary 109 (*)    All other components within normal limits  CBC WITH DIFFERENTIAL/PLATELET  TROPONIN I    EKG None  Radiology No results found.  Procedures .Cardioversion Date/Time: 08/26/2018 6:00 PM Performed by: Nat Christen, MD Authorized by: Nat Christen, MD   Consent:    Consent obtained:  Verbal   Consent given by:  Patient   Risks discussed:  Pain and induced arrhythmia Pre-procedure details:    Cardioversion basis:  Emergent   Rhythm:  Supraventricular tachycardia Patient sedated: No Attempt one:    Cardioversion mode attempt one: Adenosine 6 mg.   Shock outcome:  Conversion to normal sinus rhythm Post-procedure details:    Patient status:  Alert   Patient tolerance of procedure:  Tolerated well, no immediate complications   (including critical care time)  Medications Ordered in ED Medications  adenosine (ADENOCARD) 6 MG/2ML injection (6 mg  Given 08/26/18 1804)  metoprolol tartrate (LOPRESSOR) tablet 25 mg (25 mg Oral Given 08/26/18 0107)     Initial Impression / Assessment and Plan /  ED Course  I have reviewed the triage vital signs and the nursing notes.  Pertinent labs & imaging results that were available during my care of the patient were reviewed by me and considered in my medical decision making (see chart for details).       Patient presents with second episode of SVT in 3 days.  She converted to normal sinus rhythm after adenosine 6 mg IV.  First troponin 0 0.09.  Second troponin pending.  Discussed case with the cardiologist  on-call Dr Paticia Stack.  He recommended metoprolol 25 mg twice a day.  First dose given in the ED.  Also discussed with my colleague Dr. Rogene Houston.   Final Clinical Impressions(s) / ED Diagnoses   Final diagnoses:  SVT (supraventricular tachycardia) [facility])    ED Discharge Orders    None       Nat Christen, MD 08/26/18 2002

## 2018-08-26 NOTE — ED Notes (Signed)
Dr Earnest Conroy in to assess

## 2018-08-26 NOTE — ED Triage Notes (Signed)
Pt reports tachycardia that began around 1650. HR as high as 193. Pt seen in ED on Friday with same complaint. Pt given 6 mg adenosine and converted to NSR. Pt reports chest tightness, but no pain. AO x4.

## 2018-08-26 NOTE — ED Notes (Signed)
Pt reports her daughter is a PhD who does research for [employer] Ask for information regarding metroprolol as well as beta blockers  Pt educated regarding pathways, irritability, and given education information for her perusal   She denies thyroid issues and reports that she was asked this today also   Lab called for stat trop

## 2018-08-26 NOTE — ED Notes (Addendum)
[employer]  [phone]

## 2018-08-27 ENCOUNTER — Telehealth: Payer: Self-pay | Admitting: Cardiology

## 2018-08-27 NOTE — ED Notes (Addendum)
Pt wheeled to waiting room. Pt verbalized understanding of discharge instructions.   

## 2018-08-27 NOTE — Progress Notes (Signed)
Virtual Visit via Video Note   This visit type was conducted due to national recommendations for restrictions regarding the COVID-19 Pandemic (e.g. social distancing) in an effort to limit this patient's exposure and mitigate transmission in our community.  Due to her co-morbid illnesses, this patient is at least at moderate risk for complications without adequate follow up.  This format is felt to be most appropriate for this patient at this time.  All issues noted in this document were discussed and addressed.  A limited physical exam was performed with this format.  Please refer to the patient's chart for her consent to telehealth for [facility].   Evaluation Performed:  New patient visit.    Date:  08/28/2018   ID:  Lori Soto, DOB 10-14-1935, MRN [id number]  Patient Location: Home Provider Location: Home  PCP:  Chipper Herb, MD  Cardiologist:  Minus Breeding, MD  Electrophysiologist:  None   Chief Complaint:  Palpitations  History of Present Illness:    Lori Soto is a 83 y.o. female who presents for follow up of SVT.   I saw her for evaluation of palpitations in 2014.  She has a heart murmur and an echo in 2011 was normal.  She was in in the ED in August. 2018 but there was no recorded arrhythmia.  She had palpitations but these were infrequent so no further work up was suggested.    Now she has been in the ED recently twice with SVT. The most recent was two days ago.  174She was in the ED yesterday for this.  I reviewed these records for this visit.   She had mildly elevated troponin.   She had an SVT with a rate of 174 She was treated with adenosine.  After the second visit she was given a prescription for metoprolol 25 mg twice daily but she did not want to take this until checking with [place].  She has not had any further SVT over the last day.  She says when she gets this her heart will be racing.  On the way to the hospital the last time she had some mild chest  discomfort.  She otherwise does not describe chest pressure, neck or arm discomfort.  She did not have any syncope.  She does get short of breath with this.  The patient does not have symptoms concerning for COVID-19 infection (fever, chills, cough, or new shortness of breath).    Past Medical History:  Diagnosis Date   Anemia    Arthritis    Cancer ([place])    skin cancer on nose   Carpal tunnel syndrome, bilateral    Cataract    Esophagitis    GERD (gastroesophageal reflux disease)    H/O hiatal hernia    Osteopenia    Scoliosis    Shingles    SVT (supraventricular tachycardia) (HCC)    Ulcerative colitis    Past Surgical History:  Procedure Laterality Date   ABDOMINAL HYSTERECTOMY     BUNIONECTOMY     Bilateral   CARPAL TUNNEL RELEASE Left 03/10/2015   Procedure: LEFT CARPAL TUNNEL RELEASE;  Surgeon: Daryll Brod, MD;  Location: [place];  Service: Orthopedics;  Laterality: Left;   CARPAL TUNNEL RELEASE Right 10/31/2017   Procedure: RIGHT CARPAL TUNNEL RELEASE;  Surgeon: Daryll Brod, MD;  Location: [place];  Service: Orthopedics;  Laterality: Right;   CATARACT EXTRACTION W/PHACO  05/28/2012   Procedure: CATARACT EXTRACTION PHACO AND  INTRAOCULAR LENS PLACEMENT (IOC);  Surgeon: Tonny Branch, MD;  [facility];  Service: Ophthalmology;  Laterality: Right;  CDE=12.84   CATARACT EXTRACTION W/PHACO  06/07/2012   Procedure: CATARACT EXTRACTION PHACO AND INTRAOCULAR LENS PLACEMENT (IOC);  Surgeon: Tonny Branch, MD;  [facility];  Service: Ophthalmology;  Laterality: Left;  CDE 17.60   COLONOSCOPY     COLONOSCOPY N/A 07/08/2015   Procedure: COLONOSCOPY;  Surgeon: Rogene Houston, MD;  [facility];  Service: Endoscopy;  Laterality: N/A;  930   ESOPHAGEAL DILATION N/A 07/08/2015   Procedure: ESOPHAGEAL DILATION;  Surgeon: Rogene Houston, MD;  [facility];  Service: Endoscopy;  Laterality: N/A;    ESOPHAGOGASTRODUODENOSCOPY N/A 07/08/2015   Procedure: ESOPHAGOGASTRODUODENOSCOPY (EGD);  Surgeon: Rogene Houston, MD;  [facility];  Service: Endoscopy;  Laterality: N/A;   Hernia     Right inguinal   HERNIA REPAIR  1961   right inguinal hernia   UPPER GASTROINTESTINAL ENDOSCOPY       Prior to Admission medications   Medication Sig Start Date End Date Taking? Authorizing Provider  acetaminophen (TYLENOL 8 HOUR ARTHRITIS PAIN) 650 MG CR tablet Take 650 mg by mouth every 8 (eight) hours as needed. Patient states that she takes prn for arthritis in her knees.     [provider]  Bromelains 500 MG TABS Take 500 mg by mouth daily.    [provider]  Calcium Carbonate-Vitamin D (CALCIUM 600+D) 600-400 MG-UNIT per tablet Take 1 tablet by mouth every evening. 01/23/15   Cherre Robins, PharmD  Cayenne 500 MG CAPS Take 1 capsule by mouth daily.     [provider]  Coenzyme Q10 (CO Q-10 PO) Take 1 tablet by mouth daily. With red yeast rice [employer]    [provider]  COLLAGEN PO Take 1 tablet by mouth daily. This is known as 1-2-3    [provider]  Cyanocobalamin (VITAMIN B 12 PO) Take 1,000 mcg by mouth daily.     [provider]  Ginkgo Biloba 40 MG TABS Take 40 mg by mouth daily.     [provider]  glucosamine-chondroitin 500-400 MG tablet Take 1 tablet by mouth 2 (two) times daily.      [provider]  Hyaluronic Acid-Vitamin C (HYALURONIC ACID PO) Take 80 mg by mouth daily.    [provider]  Mesalamine (ASACOL) 400 MG CPDR DR capsule Take 2 capsules (800 mg total) by mouth daily. 02/27/18   Rogene Houston, MD  metoprolol tartrate (LOPRESSOR) 25 MG tablet Take 1 tablet (25 mg total) by mouth 2 (two) times daily. 08/26/18   Nat Christen, MD  MILK THISTLE PO Take 100 mg by mouth daily.    [provider]  MULTIPLE VITAMINS PO Take 1 tablet by mouth daily. In the mornings     [provider]  Nutritional Supplements (GRAPESEED EXTRACT PO) Take 60 mg by mouth daily.     [provider]  OAT BRAN SOLUBLE PO Take 1 tablet by mouth daily.    [provider]  OVER THE COUNTER MEDICATION Take 66 mg by mouth daily. Vision Gold Lutein    [provider]  OVER THE COUNTER MEDICATION Take 250 mg by mouth daily. Alphalipoic Acid 250 mg daily    [provider]  pantoprazole (PROTONIX) 40 MG tablet TAKE 1 TABLET DAILY BEFORE BREAKFAST Patient taking differently: Take 40 mg by mouth every morning.  07/23/18   Rogene Houston, MD  pyridOXINE (VITAMIN B-6) 100 MG tablet Take 100 mg by mouth daily.     [provider]  Turmeric 500 MG CAPS Take 1 capsule by mouth 3 (three) times daily.     [provider]  Vitamins C E (CRANBERRY CONCENTRATE PO) Take 2,500 mg by mouth daily.     [provider]  Zinc 50 MG CAPS Take 50 mg by mouth daily.     [provider]     Allergies:   Penicillins; Tape; and Influenza virus vacc split pf   Social History   Tobacco Use   Smoking status: Never Smoker   Smokeless tobacco: Never Used   Tobacco comment: Never smoker  Substance Use Topics   Alcohol use: Yes    Alcohol/week: 0.0 standard drinks    Comment: Very Rarely   Drug use: No     Family Hx: The patient's family history includes Arthritis in her father and mother; COPD in her sister; Cancer in her brother and father; Colon cancer in her sister; Diabetes in her sister and sister; Edema in her sister; GI problems in her sister and sister; Heart disease in her mother and sister; Hiatal hernia in her sister; Liver cancer in her brother; Lung cancer in her brother; Neuropathy in her sister; Pancreatic cancer in her father; Peptic Ulcer in her sister.  ROS:   Please see the history of present illness.    As stated in the HPI and negative for all other systems.    Prior CV studies:   The following  studies were reviewed today:  ED records  Labs/Other Tests and Data Reviewed:    EKG:  NSR, rate 90, axis within normal limits, intervals within normal limits, PAC, no acute ST-T wave changes.  08/24/2018 17: 13.  SVT rate 174, nonspecific ST changes 08/24/2018  Recent Labs: 08/24/2018: Magnesium 1.9; TSH 0.829 08/26/2018: ALT 19; BUN 12; Creatinine, Ser 0.88; Hemoglobin 15.0; Platelets 184; Potassium 4.0; Sodium 138   Recent Lipid Panel Lab Results  Component Value Date/Time   CHOL 203 (H) 03/14/2018 11:47 AM   TRIG 101 03/14/2018 11:47 AM   TRIG 79 12/18/2015 11:05 AM   HDL 68 03/14/2018 11:47 AM   HDL 65 12/18/2015 11:05 AM   CHOLHDL 3.0 03/14/2018 11:47 AM   LDLCALC 115 (H) 03/14/2018 11:47 AM   LDLCALC 109 (H) 11/29/2013 08:22 AM    Wt Readings from Last 3 Encounters:  08/28/18 130 lb (59 kg)  08/26/18 132 lb (59.9 kg)  08/24/18 132 lb (59.9 kg)     Objective:    Vital Signs:  BP 120/79    Pulse 72    Ht 5' (1.524 m)    Wt 130 lb (59 kg)    SpO2 98%    BMI 25.39 kg/m    VITAL SIGNS:  reviewed GEN:  no acute distress EYES:  sclerae anicteric, EOMI - Extraocular Movements Intact NEURO:  alert and oriented x 3, no obvious focal deficit PSYCH:  normal affect  ASSESSMENT & PLAN:    SVT: I had a very long discussion with the patient and her friend who asked multiple appropriate questions.  It is clear that the patient has had recurrent SVT which were the symptoms that she had in 2019 when she presented to the emergency room.  She is also had short bouts of this this year.  This is now the second recent emergency room visit.  At this point  we discussed possible ablation.  We discussed risks and benefits.  She would like to hold off on this if he can be managed medically.  She is reluctant to start a higher dose of beta-blocker but will start with 12-1/2 mg twice daily and if she tolerates this increase to 25 mg twice daily.  If she has break through palpitations she would  consider allowing me to increase the dose.  If she has recurrent severe episodes she would consider ablation.  She does understand that there is slight risk to medical management of this given the elevated troponin and how severe she was and she will call 911 if she has any recurrent sustained episodes.  She will take an extra metoprolol at that point while she is calling 911.  ELEVATED TROPONIN: Her troponin was very mildly elevated the second time.  It is not checked in the first time.  This was probably secondary to the rapid rate.  I talked about ordering an echocardiogram and she was insistent that she had one recently but I think she is talking about an EKG.  I will try to talk her into allowing me to perform an echocardiogram.  Pending this result I would not suggest an ischemia work-up as this was most likely related to the arrhythmia.  COVID-19 Education: The signs and symptoms of COVID-19 were discussed with the patient and how to seek care for testing (follow up with PCP or arrange E-visit).  The importance of social distancing was discussed today.  Time:   Today, I have spent 40 minutes with the patient with telehealth technology discussing the above problems.     Medication Adjustments/Labs and Tests Ordered: Current medicines are reviewed at length with the patient today.  Concerns regarding medicines are outlined above.   Tests Ordered: No orders of the defined types were placed in this encounter.   Medication Changes: No orders of the defined types were placed in this encounter.   Disposition:  Follow up with me with a video visit in two weeks.    Signed, Minus Breeding, MD  08/28/2018 11:20 AM    [place]

## 2018-08-27 NOTE — Telephone Encounter (Signed)
  Pt c/o medication issue:  1. Name of Medication: metoprolol tartrate (LOPRESSOR) 25 MG tablet  2. How are you currently taking this medication (dosage and times per day)?  Take 1 tablet (25 mg total) by mouth 2 (two) times daily.  3. Are you having a reaction (difficulty breathing--STAT)?  No  4. What is your medication issue? This is a new medication for the patient and she feels that the dosage his too high for her and she would like to speak with someone before she gets it filled and starts taking it. She also wants to know when she can start getting testing done. Patient also was in the ER twice this past weekend. She is aware of appt on Tuesday with Dr Percival Spanish.

## 2018-08-27 NOTE — Telephone Encounter (Signed)
Spoke with pt who is concerned that metoprolol tartrate 25 mg BID  prescribed during most recent ED visit is too high of a dose for her to start on. She has not yet gone to her pharmacy to pick up the medication and would like Dr. Percival Spanish to review notes in the chart from recent ED visits over the weekend and advise. Pt collected BP and HR over the phone. BP 128/81 and HR 54. Pt denied palpitations. Pt aware that she has appt with Dr. Percival Spanish scheduled for 4/28 but insists on speaking with Dr. Percival Spanish today. She requests a call from Dr. Percival Spanish today to discuss medication and any testing he may recommend 'to figure out what is going on'. Informed pt that message woyld be routed to Dr. Percival Spanish for review

## 2018-08-28 ENCOUNTER — Telehealth (INDEPENDENT_AMBULATORY_CARE_PROVIDER_SITE_OTHER): Payer: Medicare Other | Admitting: Cardiology

## 2018-08-28 ENCOUNTER — Encounter: Payer: Self-pay | Admitting: Cardiology

## 2018-08-28 VITALS — BP 120/79 | HR 72 | Ht 60.0 in | Wt 130.0 lb

## 2018-08-28 DIAGNOSIS — I471 Supraventricular tachycardia, unspecified: Secondary | ICD-10-CM | POA: Insufficient documentation

## 2018-08-28 DIAGNOSIS — R7989 Other specified abnormal findings of blood chemistry: Secondary | ICD-10-CM | POA: Insufficient documentation

## 2018-08-28 DIAGNOSIS — R778 Other specified abnormalities of plasma proteins: Secondary | ICD-10-CM | POA: Insufficient documentation

## 2018-08-28 MED ORDER — METOPROLOL TARTRATE 25 MG PO TABS: 25.0000 mg | ORAL_TABLET | Freq: Two times a day (BID) | ORAL | 6 refills | Status: DC

## 2018-08-28 NOTE — Telephone Encounter (Signed)
Pt have virtual visit today with Dr Percival Spanish

## 2018-08-28 NOTE — Patient Instructions (Addendum)
Medication Instructions:  START- Metoprolol 25 mg take 12.5 mg(1/2 tablet) twice a day for couple days, then increase to 1 tablet twice a day  If you need a refill on your cardiac medications before your next appointment, please call your pharmacy.  Labwork: None Ordered   Testing/Procedures: None Ordered  Follow-Up: . Your physician recommends that you schedule a follow-up appointment in: May 15th @ 10:20 am   At [facility], you and your health needs are our priority.  As part of our continuing mission to provide you with exceptional heart care, we have created designated Provider Care Teams.  These Care Teams include your primary Cardiologist (physician) and Advanced Practice Providers (APPs -  Physician Assistants and Nurse Practitioners) who all work together to provide you with the care you need, when you need it.  Thank you for choosing [facility] at [facility]!!

## 2018-09-13 ENCOUNTER — Telehealth: Payer: Self-pay | Admitting: Cardiology

## 2018-09-13 NOTE — Progress Notes (Signed)
Virtual Visit via Telephone Note   This visit type was conducted due to national recommendations for restrictions regarding the COVID-19 Pandemic (e.g. social distancing) in an effort to limit this patient's exposure and mitigate transmission in our community.  Due to her co-morbid illnesses, this patient is at least at moderate risk for complications without adequate follow up.  This format is felt to be most appropriate for this patient at this time.  The patient did not have access to video technology/had technical difficulties with video requiring transitioning to audio format only (telephone).  All issues noted in this document were discussed and addressed.  No physical exam could be performed with this format.  Please refer to the patient's chart for her  consent to telehealth for [facility].   Date:  09/14/2018   ID:  Lori Soto, DOB [birth year], MRN [id number]  Patient Location: Home Provider Location: Home  PCP:  Chipper Herb, MD  Cardiologist:  Minus Breeding, MD  Electrophysiologist:  None   Evaluation Performed:  Follow-Up Visit  Chief Complaint:  Palpitations   History of Present Illness:    I saw her for evaluation of palpitations in 2014. She has a heart murmur and an echo in 2011 was normal. She was in in the ED in August. 2018 but there was no recorded arrhythmia.  She had palpitations but these were infrequent so no further work up was suggested.    Now she has been in the ED recently twice with SVT.    She had mildly elevated troponin.   She had an SVT with a rate of 174 She was treated with adenosine.  After the second visit she was given a prescription for metoprolol 25 mg twice daily but she did not want to take this until checking with [place].  I talked to her about titrating the beta blocker and the possibility of ablation.  She wanted to try medications.    Since I last talked to her she has had no acute complaints.  She is had no further sustained  tachypalpitations.  She is only taking her metoprolol 1/2 tablet twice daily.  She is leery of taking any medications and a little bit confused about why she would want to take more.  She denies any new shortness of breath PND or orthopnea.  She is not having any chest pressure.  She actually is visiting her great-grandchildren in [place] right now.   The patient does not have symptoms concerning for COVID-19 infection (fever, chills, cough, or new shortness of breath).    Past Medical History:  Diagnosis Date  . Anemia   . Arthritis   . Cancer ([place])    skin cancer on nose  . Carpal tunnel syndrome, bilateral   . Cataract   . Esophagitis   . GERD (gastroesophageal reflux disease)   . H/O hiatal hernia   . Osteopenia   . Scoliosis   . Shingles   . SVT (supraventricular tachycardia) ([place])   . Ulcerative colitis    Past Surgical History:  Procedure Laterality Date  . ABDOMINAL HYSTERECTOMY    . BUNIONECTOMY     Bilateral  . CARPAL TUNNEL RELEASE Left 03/10/2015   Procedure: LEFT CARPAL TUNNEL RELEASE;  Surgeon: Daryll Brod, MD;  Location: [place];  Service: Orthopedics;  Laterality: Left;  . CARPAL TUNNEL RELEASE Right 10/31/2017   Procedure: RIGHT CARPAL TUNNEL RELEASE;  Surgeon: Daryll Brod, MD;  Location: [place];  Service: Orthopedics;  Laterality: Right;  . CATARACT EXTRACTION W/PHACO  05/28/2012   Procedure: CATARACT EXTRACTION PHACO AND INTRAOCULAR LENS PLACEMENT (IOC);  Surgeon: Tonny Branch, MD;  [facility];  Service: Ophthalmology;  Laterality: Right;  CDE=12.84  . CATARACT EXTRACTION W/PHACO  06/07/2012   Procedure: CATARACT EXTRACTION PHACO AND INTRAOCULAR LENS PLACEMENT (IOC);  Surgeon: Tonny Branch, MD;  [facility];  Service: Ophthalmology;  Laterality: Left;  CDE 17.60  . COLONOSCOPY    . COLONOSCOPY N/A 07/08/2015   Procedure: COLONOSCOPY;  Surgeon: Rogene Houston, MD;  [facility];  Service: Endoscopy;  Laterality:  N/A;  930  . ESOPHAGEAL DILATION N/A 07/08/2015   Procedure: ESOPHAGEAL DILATION;  Surgeon: Rogene Houston, MD;  [facility];  Service: Endoscopy;  Laterality: N/A;  . ESOPHAGOGASTRODUODENOSCOPY N/A 07/08/2015   Procedure: ESOPHAGOGASTRODUODENOSCOPY (EGD);  Surgeon: Rogene Houston, MD;  [facility];  Service: Endoscopy;  Laterality: N/A;  . Hernia     Right inguinal  . HERNIA REPAIR  1961   right inguinal hernia  . UPPER GASTROINTESTINAL ENDOSCOPY       Current Meds  Medication Sig  . acetaminophen (TYLENOL 8 HOUR ARTHRITIS PAIN) 650 MG CR tablet Take 650 mg by mouth every 8 (eight) hours as needed. Patient states that she takes prn for arthritis in her knees.   . Bromelains 500 MG TABS Take 500 mg by mouth daily.  . Calcium Carbonate-Vitamin D (CALCIUM 600+D) 600-400 MG-UNIT per tablet Take 1 tablet by mouth every evening.  [name] 500 MG CAPS Take 1 capsule by mouth daily.   . Coenzyme Q10 (CO Q-10 PO) Take 1 tablet by mouth daily. With red yeast rice [employer]  . COLLAGEN PO Take 1 tablet by mouth daily. This is known as 1-2-3  . Cyanocobalamin (VITAMIN B 12 PO) Take 1,000 mcg by mouth daily.   . Ginkgo Biloba 40 MG TABS Take 40 mg by mouth daily.   [name] glucosamine-chondroitin 500-400 MG tablet Take 1 tablet by mouth 2 (two) times daily.    [name] Hyaluronic Acid-Vitamin C (HYALURONIC ACID PO) Take 80 mg by mouth daily.  . Mesalamine (ASACOL) 400 MG CPDR DR capsule Take 2 capsules (800 mg total) by mouth daily.  . metoprolol tartrate (LOPRESSOR) 25 MG tablet Take 1 tablet (25 mg total) by mouth 2 (two) times daily. (Patient taking differently: Take 12.5 mg by mouth 2 (two) times daily. )  . MILK THISTLE PO Take 100 mg by mouth daily.  . MULTIPLE VITAMINS PO Take 1 tablet by mouth daily. In the mornings  . Nutritional Supplements (GRAPESEED EXTRACT PO) Take 60 mg by mouth daily.   [name] OAT BRAN SOLUBLE PO Take 1 tablet by mouth daily.  [name] OVER THE COUNTER  MEDICATION Take 66 mg by mouth daily. [employer] Lutein  . OVER THE COUNTER MEDICATION Take 250 mg by mouth daily. Alphalipoic Acid 250 mg daily  . pantoprazole (PROTONIX) 40 MG tablet TAKE 1 TABLET DAILY BEFORE BREAKFAST (Patient taking differently: Take 40 mg by mouth every morning. )  . pyridOXINE (VITAMIN B-6) 100 MG tablet Take 100 mg by mouth daily.   . Turmeric 500 MG CAPS Take 1 capsule by mouth 3 (three) times daily.   . Vitamins C E (CRANBERRY CONCENTRATE PO) Take 2,500 mg by mouth daily.   . Zinc 50 MG CAPS Take 50 mg by mouth daily.      Allergies:   Penicillins; Tape; and Influenza virus  vacc split pf   Social History   Tobacco Use  . Smoking status: Never Smoker  . Smokeless tobacco: Never Used  . Tobacco comment: Never smoker  Substance Use Topics  . Alcohol use: Yes    Alcohol/week: 0.0 standard drinks    Comment: Very Rarely  . Drug use: No     Family Hx: The patient's family history includes Arthritis in her father and mother; COPD in her sister; Cancer in her brother and father; Colon cancer in her sister; Diabetes in her sister and sister; Edema in her sister; GI problems in her sister and sister; Heart disease in her mother and sister; Hiatal hernia in her sister; Liver cancer in her brother; Lung cancer in her brother; Neuropathy in her sister; Pancreatic cancer in her father; Peptic Ulcer in her sister.  ROS:   Please see the history of present illness.    As stated in the HPI and negative for all other systems.    Prior CV studies:   The following studies were reviewed today:    Labs/Other Tests and Data Reviewed:    EKG:  No ECG reviewed.  Recent Labs: 08/24/2018: Magnesium 1.9; TSH 0.829 08/26/2018: ALT 19; BUN 12; Creatinine, Ser 0.88; Hemoglobin 15.0; Platelets 184; Potassium 4.0; Sodium 138   Recent Lipid Panel Lab Results  Component Value Date/Time   CHOL 203 (H) 03/14/2018 11:47 AM   TRIG 101 03/14/2018 11:47 AM   TRIG 79 12/18/2015  11:05 AM   HDL 68 03/14/2018 11:47 AM   HDL 65 12/18/2015 11:05 AM   CHOLHDL 3.0 03/14/2018 11:47 AM   LDLCALC 115 (H) 03/14/2018 11:47 AM   LDLCALC 109 (H) 11/29/2013 08:22 AM    Wt Readings from Last 3 Encounters:  09/14/18 132 lb (59.9 kg)  08/28/18 130 lb (59 kg)  08/26/18 132 lb (59.9 kg)     Objective:    Vital Signs:  BP 127/78   Pulse (!) 58   Ht 5' (1.524 m)   Wt 132 lb (59.9 kg)   BMI 25.78 kg/m    VITAL SIGNS:  reviewed  ASSESSMENT & PLAN:    SVT:    I convinced her to take an extra half of metoprolol in the morning.  We will see if she tolerates this.  She understands vagal maneuvers.  She might want to consider an ablation only if she has recurrent arrhythmias.  ELEVATED TROPONIN: This was in the hospital and secondary to demand ischemia.  She is not having any symptoms.  I am not planning an ischemia work-up at this point.[name]   COVID-19 Education: The signs and symptoms of COVID-19 were discussed with the patient and how to seek care for testing (follow up with PCP or arrange E-visit).   The importance of social distancing was discussed today.  Time:   Today, I have spent 16 minutes with the patient with telehealth technology discussing the above problems.     Medication Adjustments/Labs and Tests Ordered: Current medicines are reviewed at length with the patient today.  Concerns regarding medicines are outlined above.   Tests Ordered: No orders of the defined types were placed in this encounter.   Medication Changes: No orders of the defined types were placed in this encounter.   Disposition:  Follow up see me in two months.  prefers an office visit.   Signed, Minus Breeding, MD  09/14/2018 10:18 AM    [place]

## 2018-09-13 NOTE — Telephone Encounter (Signed)
Smartphone/ consent/ my chart active/ pre reg completed

## 2018-09-14 ENCOUNTER — Telehealth (INDEPENDENT_AMBULATORY_CARE_PROVIDER_SITE_OTHER): Payer: Medicare Other | Admitting: Cardiology

## 2018-09-14 ENCOUNTER — Encounter: Payer: Self-pay | Admitting: Cardiology

## 2018-09-14 VITALS — BP 127/78 | HR 58 | Ht 60.0 in | Wt 132.0 lb

## 2018-09-14 DIAGNOSIS — Z7189 Other specified counseling: Secondary | ICD-10-CM

## 2018-09-14 DIAGNOSIS — R778 Other specified abnormalities of plasma proteins: Secondary | ICD-10-CM

## 2018-09-14 DIAGNOSIS — I471 Supraventricular tachycardia: Secondary | ICD-10-CM

## 2018-09-14 NOTE — Patient Instructions (Addendum)
Medication Instructions:  INCREASE- Metoprolol 25 mg twice a day  If you need a refill on your cardiac medications before your next appointment, please call your pharmacy.  Labwork: None Ordered   Testing/Procedures: None Ordered  Follow-Up: . Your physician recommends that you schedule a follow-up appointment in: 2 Months in [facility] Wednesday July 15th @ 11:20 am   At [facility], you and your health needs are our priority.  As part of our continuing mission to provide you with exceptional heart care, we have created designated Provider Care Teams.  These Care Teams include your primary Cardiologist (physician) and Advanced Practice Providers (APPs -  Physician Assistants and Nurse Practitioners) who all work together to provide you with the care you need, when you need it.  Thank you for choosing [facility]!!

## 2018-09-25 ENCOUNTER — Other Ambulatory Visit: Payer: Self-pay | Admitting: *Deleted

## 2018-09-25 MED ORDER — METOPROLOL TARTRATE 25 MG PO TABS: 25.0000 mg | ORAL_TABLET | Freq: Two times a day (BID) | ORAL | 3 refills | Status: DC

## 2018-09-25 NOTE — Telephone Encounter (Signed)
Called pt to inquire about appt with Dr.Hochrein on May 27. She has an appt. On July 15 for FU and does not need tomorrows appt. Pt also requested her Metoprolol 25 mg, twice daily be refilled with [facility].

## 2018-09-26 ENCOUNTER — Ambulatory Visit: Payer: Medicare Other | Admitting: Cardiology

## 2018-10-02 ENCOUNTER — Ambulatory Visit: Payer: Medicare Other | Attending: Orthopaedic Surgery | Admitting: Physical Therapy

## 2018-10-02 ENCOUNTER — Other Ambulatory Visit: Payer: Self-pay

## 2018-10-02 DIAGNOSIS — M545 Low back pain: Secondary | ICD-10-CM | POA: Diagnosis not present

## 2018-10-02 DIAGNOSIS — G8929 Other chronic pain: Secondary | ICD-10-CM | POA: Diagnosis not present

## 2018-10-02 DIAGNOSIS — M546 Pain in thoracic spine: Secondary | ICD-10-CM | POA: Insufficient documentation

## 2018-10-02 DIAGNOSIS — M6281 Muscle weakness (generalized): Secondary | ICD-10-CM

## 2018-10-02 DIAGNOSIS — R293 Abnormal posture: Secondary | ICD-10-CM

## 2018-10-02 NOTE — Therapy (Signed)
[facility] [place], [place], [address] Phone: [phone]   Fax:  [phone]  Physical Therapy Treatment  Patient Details  Name: Lori Soto MRN: [id number] Date of Birth: 1935/12/06 Referring Provider (PT): Beau Fanny, [place]   Encounter Date: 10/02/2018  PT End of Session - 10/02/18 1232    Visit Number  9    Number of Visits  18    Date for PT Re-Evaluation  11/27/18    PT Start Time  1120    PT Stop Time  1227    PT Time Calculation (min)  67 min       Past Medical History:  Diagnosis Date  . Anemia   . Arthritis   . Cancer ([place])    skin cancer on nose  . Carpal tunnel syndrome, bilateral   . Cataract   . Esophagitis   . GERD (gastroesophageal reflux disease)   . H/O hiatal hernia   . Osteopenia   . Scoliosis   . Shingles   . SVT (supraventricular tachycardia) ([place])   . Ulcerative colitis     Past Surgical History:  Procedure Laterality Date  . ABDOMINAL HYSTERECTOMY    . BUNIONECTOMY     Bilateral  . CARPAL TUNNEL RELEASE Left 03/10/2015   Procedure: LEFT CARPAL TUNNEL RELEASE;  Surgeon: Daryll Brod, MD;  Location: [place];  Service: Orthopedics;  Laterality: Left;  . CARPAL TUNNEL RELEASE Right 10/31/2017   Procedure: RIGHT CARPAL TUNNEL RELEASE;  Surgeon: Daryll Brod, MD;  Location: [place];  Service: Orthopedics;  Laterality: Right;  . CATARACT EXTRACTION W/PHACO  05/28/2012   Procedure: CATARACT EXTRACTION PHACO AND INTRAOCULAR LENS PLACEMENT (IOC);  Surgeon: Tonny Branch, MD;  [facility];  Service: Ophthalmology;  Laterality: Right;  CDE=12.84  . CATARACT EXTRACTION W/PHACO  06/07/2012   Procedure: CATARACT EXTRACTION PHACO AND INTRAOCULAR LENS PLACEMENT (IOC);  Surgeon: Tonny Branch, MD;  [facility];  Service: Ophthalmology;  Laterality: Left;  CDE 17.60  . COLONOSCOPY    . COLONOSCOPY N/A 07/08/2015   Procedure: COLONOSCOPY;  Surgeon: Rogene Houston, MD;  [facility];  Service: Endoscopy;  Laterality: N/A;  930  . ESOPHAGEAL DILATION N/A 07/08/2015   Procedure: ESOPHAGEAL DILATION;  Surgeon: Rogene Houston, MD;  [facility];  Service: Endoscopy;  Laterality: N/A;  . ESOPHAGOGASTRODUODENOSCOPY N/A 07/08/2015   Procedure: ESOPHAGOGASTRODUODENOSCOPY (EGD);  Surgeon: Rogene Houston, MD;  [facility];  Service: Endoscopy;  Laterality: N/A;  . Hernia     Right inguinal  . HERNIA REPAIR  1961   right inguinal hernia  . UPPER GASTROINTESTINAL ENDOSCOPY      There were no vitals filed for this visit.  Subjective Assessment - 10/02/18 1221    Subjective  COVID-19 screen performed prior to patient entering clinic.  Patient returns to PT today with an 8/10 pain-level in her back.  She reports pain across her low back today and right mid-back.    Pertinent History  Scoliosis, hiatial hernia, osteopenia, allergy to tape    Limitations  Sitting;House hold activities;Standing;Walking    How long can you stand comfortably?  20 minutes    How long can you walk comfortably?  20-30 minutes max    Diagnostic tests  x-ray    Patient Stated Goals  improve ability to perform home activities with less pain    Currently in Pain?  Yes    Pain Score  8     Pain Location  Back    Pain Orientation  Right;Lower    Pain Descriptors / Indicators  Discomfort    Pain Onset  More than a month ago                       [facility] Adult PT Treatment/Exercise - 10/02/18 0001      Modalities   Modalities  Electrical Stimulation;Moist Heat;Traction      Moist Heat Therapy   Number Minutes Moist Heat  19 Minutes    Moist Heat Location  Lumbar Spine      Electrical Stimulation   Electrical Stimulation Location  Bilateral LB/RT T-spine.    Electrical Stimulation Action  IFC    Electrical Stimulation Parameters  80-150 Hz x 20 minutes.    Electrical Stimulation Goals  Pain      Traction   Type of Traction   Lumbar    Min (lbs)  5    Max (lbs)  70    Hold Time  99    Rest Time  5    Time  15      Manual Therapy   Manual Therapy  Soft tissue mobilization    Soft tissue mobilization  STW/M x 8 miuntes with emphasis on right low back (QL).                  PT Long Term Goals - 07/10/18 1417      PT LONG TERM GOAL #1   Title  Patient will be independent with HEP    Baseline       Time  6    Period  Weeks    Status  On-going      PT LONG TERM GOAL #2   Title  Patient will report ability to perfrom ADLs with low back pain less than or equal to 4/10    Baseline       Time  6    Period  Weeks    Status  On-going      PT LONG TERM GOAL #3   Title  Patient will demonstrate 4/5 or greater bilateral LE MMT to improve stability during functional tasks.    Baseline       Time  6    Status  On-going      PT LONG TERM GOAL #4   Title  Patient will report ability to stand for 20 minutes or greater with low back pain less than or equal to 5/10 to improve ability to perform home tasks.     Time  6    Period  Weeks    Status  On-going            Plan - 10/02/18 1237    Clinical Impression Statement  Patient returning to PT today with a high pain-level rated at 8/10.  Her CC is pain across her low back and right thoracic region.  She had an active trigger point in her right QL that responded well to STW/M.  She enjoyed traction at 70# and felt better after treatment.    PT Treatment/Interventions  Electrical Stimulation;ADLs/Self Care Home Management;Cryotherapy;Traction;Ultrasound;Moist Heat;Iontophoresis 43m/ml Dexamethasone;Gait training;Stair training;Manual techniques;Dry needling;Passive range of motion;Balance training;Neuromuscular re-education;Therapeutic exercise;Therapeutic activities;Patient/family education    PT Next Visit Plan   lumbar traction, STW/M modalities for pain relief.      PT Home Exercise Plan  see patient education section    Consulted and Agree with  Plan of Care  Patient       Patient will benefit from skilled therapeutic intervention in order to improve the following deficits and impairments:  Abnormal gait, Pain, Postural dysfunction, Decreased activity tolerance, Decreased range of motion, Decreased strength  Visit Diagnosis: Chronic bilateral low back pain without sciatica - Plan: PT plan of care cert/re-cert  Abnormal posture - Plan: PT plan of care cert/re-cert  Muscle weakness (generalized) - Plan: PT plan of care cert/re-cert  Pain in thoracic spine - Plan: PT plan of care cert/re-cert  Chronic right-sided low back pain without sciatica - Plan: PT plan of care cert/re-cert     Problem List Patient Active Problem List   Diagnosis Date Noted  . SVT (supraventricular tachycardia) ([place]) 08/28/2018  . Elevated troponin 08/28/2018  . Light headedness 05/09/2018  . Osteopenia of the elderly 02/19/2014  . DDD (degenerative disc disease), lumbar 12/03/2013  . Scoliosis 12/03/2013  . Chronic LBP 05/14/2013  . IBS (irritable bowel syndrome) 11/08/2012  . UC (ulcerative colitis) ([place]) 04/23/2012  . Palpitation 10/26/2011  . Hypertension 10/26/2011  . Chronic ulcerative colitis ([place]) 03/23/2011    Lori Soto, [place] MPT 10/02/2018, 12:42 PM  [facility] [address] [place], [place], [address] Phone: [phone]   Fax:  [phone]  Name: Lori Soto MRN: [id number] Date of Birth: Oct 19, 1935

## 2018-10-09 ENCOUNTER — Other Ambulatory Visit: Payer: Self-pay

## 2018-10-09 ENCOUNTER — Ambulatory Visit: Payer: Medicare Other | Admitting: Physical Therapy

## 2018-10-09 DIAGNOSIS — M545 Low back pain: Secondary | ICD-10-CM | POA: Diagnosis not present

## 2018-10-09 DIAGNOSIS — M6281 Muscle weakness (generalized): Secondary | ICD-10-CM | POA: Diagnosis not present

## 2018-10-09 DIAGNOSIS — R293 Abnormal posture: Secondary | ICD-10-CM

## 2018-10-09 DIAGNOSIS — G8929 Other chronic pain: Secondary | ICD-10-CM

## 2018-10-09 DIAGNOSIS — M546 Pain in thoracic spine: Secondary | ICD-10-CM | POA: Diagnosis not present

## 2018-10-09 NOTE — Therapy (Signed)
[facility] [place], [place], [address] Phone: [phone]   Fax:  [phone]  Physical Therapy Treatment  Patient Details  Name: Lori Soto MRN: [id number] Date of Birth: December 29, 1935 Referring Provider (PT): Beau Fanny, [place]   Encounter Date: 10/09/2018  PT End of Session - 10/09/18 1528    Visit Number  10    Number of Visits  18    Date for PT Re-Evaluation  11/27/18    PT Start Time  1120    PT Stop Time  1219    PT Time Calculation (min)  59 min    Activity Tolerance  Patient tolerated treatment well    Behavior During Therapy  [facility] for tasks assessed/performed       Past Medical History:  Diagnosis Date  . Anemia   . Arthritis   . Cancer ([place])    skin cancer on nose  . Carpal tunnel syndrome, bilateral   . Cataract   . Esophagitis   . GERD (gastroesophageal reflux disease)   . H/O hiatal hernia   . Osteopenia   . Scoliosis   . Shingles   . SVT (supraventricular tachycardia) ([place])   . Ulcerative colitis     Past Surgical History:  Procedure Laterality Date  . ABDOMINAL HYSTERECTOMY    . BUNIONECTOMY     Bilateral  . CARPAL TUNNEL RELEASE Left 03/10/2015   Procedure: LEFT CARPAL TUNNEL RELEASE;  Surgeon: Daryll Brod, MD;  Location: [place];  Service: Orthopedics;  Laterality: Left;  . CARPAL TUNNEL RELEASE Right 10/31/2017   Procedure: RIGHT CARPAL TUNNEL RELEASE;  Surgeon: Daryll Brod, MD;  Location: [place];  Service: Orthopedics;  Laterality: Right;  . CATARACT EXTRACTION W/PHACO  05/28/2012   Procedure: CATARACT EXTRACTION PHACO AND INTRAOCULAR LENS PLACEMENT (IOC);  Surgeon: Tonny Branch, MD;  [facility];  Service: Ophthalmology;  Laterality: Right;  CDE=12.84  . CATARACT EXTRACTION W/PHACO  06/07/2012   Procedure: CATARACT EXTRACTION PHACO AND INTRAOCULAR LENS PLACEMENT (IOC);  Surgeon: Tonny Branch, MD;  [facility];  Service: Ophthalmology;   Laterality: Left;  CDE 17.60  . COLONOSCOPY    . COLONOSCOPY N/A 07/08/2015   Procedure: COLONOSCOPY;  Surgeon: Rogene Houston, MD;  [facility];  Service: Endoscopy;  Laterality: N/A;  930  . ESOPHAGEAL DILATION N/A 07/08/2015   Procedure: ESOPHAGEAL DILATION;  Surgeon: Rogene Houston, MD;  [facility];  Service: Endoscopy;  Laterality: N/A;  . ESOPHAGOGASTRODUODENOSCOPY N/A 07/08/2015   Procedure: ESOPHAGOGASTRODUODENOSCOPY (EGD);  Surgeon: Rogene Houston, MD;  [facility];  Service: Endoscopy;  Laterality: N/A;  . Hernia     Right inguinal  . HERNIA REPAIR  1961   right inguinal hernia  . UPPER GASTROINTESTINAL ENDOSCOPY      There were no vitals filed for this visit.  Subjective Assessment - 10/09/18 1517    Subjective  COVID-19 screen performed prior to patient entering clinic.  Felt good after last treatment and was able to stand straighter.  Did a lot around the house so my pain is up.    Pertinent History  Scoliosis, hiatial hernia, osteopenia, allergy to tape    Limitations  Sitting;House hold activities;Standing;Walking    How long can you stand comfortably?  20 minutes    How long can you walk comfortably?  20-30 minutes max    Diagnostic tests  x-ray    Patient Stated Goals  improve ability to  perform home activities with less pain    Currently in Pain?  Yes    Pain Score  8     Pain Location  Back    Pain Orientation  Right;Lower    Pain Descriptors / Indicators  Discomfort    Pain Type  Chronic pain    Pain Onset  More than a month ago                       [facility] Adult PT Treatment/Exercise - 10/09/18 0001      Moist Heat Therapy   Number Minutes Moist Heat  20 Minutes    Moist Heat Location  Lumbar Spine      Electrical Stimulation   Electrical Stimulation Location  RT LB.    Electrical Stimulation Action  Pre-mod.    Electrical Stimulation Parameters  80-150 Hz x 20 minutes.    Electrical Stimulation Goals   Pain      Traction   Type of Traction  Lumbar    Min (lbs)  5    Max (lbs)  70    Hold Time  99    Rest Time  5    Time  15      Manual Therapy   Manual Therapy  Soft tissue mobilization    Soft tissue mobilization  STW/M x 8 minutes to right low back including right QL release technique.                  PT Long Term Goals - 07/10/18 1417      PT LONG TERM GOAL #1   Title  Patient will be independent with HEP    Baseline       Time  6    Period  Weeks    Status  On-going      PT LONG TERM GOAL #2   Title  Patient will report ability to perfrom ADLs with low back pain less than or equal to 4/10    Baseline       Time  6    Period  Weeks    Status  On-going      PT LONG TERM GOAL #3   Title  Patient will demonstrate 4/5 or greater bilateral LE MMT to improve stability during functional tasks.    Baseline       Time  6    Status  On-going      PT LONG TERM GOAL #4   Title  Patient will report ability to stand for 20 minutes or greater with low back pain less than or equal to 5/10 to improve ability to perform home tasks.     Time  6    Period  Weeks    Status  On-going            Plan - 10/09/18 1531    Clinical Impression Statement  See "Therapy Note" section.    Stability/Clinical Decision Making  Evolving/Moderate complexity    PT Treatment/Interventions  Electrical Stimulation;ADLs/Self Care Home Management;Cryotherapy;Traction;Ultrasound;Moist Heat;Iontophoresis 14m/ml Dexamethasone;Gait training;Stair training;Manual techniques;Dry needling;Passive range of motion;Balance training;Neuromuscular re-education;Therapeutic exercise;Therapeutic activities;Patient/family education    PT Next Visit Plan  Increase traction to 75#.    PT Home Exercise Plan  see patient education section    Consulted and Agree with Plan of Care  Patient       Patient will benefit from skilled therapeutic intervention in order to improve the following deficits and  impairments:  Abnormal gait, Pain, Postural dysfunction, Decreased activity tolerance, Decreased range of motion, Decreased strength  Visit Diagnosis: Chronic bilateral low back pain without sciatica  Abnormal posture     Problem List Patient Active Problem List   Diagnosis Date Noted  . SVT (supraventricular tachycardia) ([place]) 08/28/2018  . Elevated troponin 08/28/2018  . Light headedness 05/09/2018  . Osteopenia of the elderly 02/19/2014  . DDD (degenerative disc disease), lumbar 12/03/2013  . Scoliosis 12/03/2013  . Chronic LBP 05/14/2013  . IBS (irritable bowel syndrome) 11/08/2012  . UC (ulcerative colitis) ([place]) 04/23/2012  . Palpitation 10/26/2011  . Hypertension 10/26/2011  . Chronic ulcerative colitis ([place]) 03/23/2011   Progress Note Reporting Period 06/26/18 to 10/08/18  See note below for Objective Data and Assessment of Progress/Goals. Patient just getting started back in PT after a long lay from PT.  No goals met.      [place], [place] MPT 10/09/2018, 3:36 PM  [facility] [address] [place], [place], [address] Phone: [phone]   Fax:  [phone]  Name: Saachi Zale MRN: [id number] Date of Birth: [birth year]

## 2018-10-12 ENCOUNTER — Telehealth: Payer: Self-pay | Admitting: Family Medicine

## 2018-10-12 NOTE — Telephone Encounter (Signed)
Requesting to speak to [name] only.

## 2018-10-12 NOTE — Telephone Encounter (Signed)
Pt called

## 2018-10-23 ENCOUNTER — Other Ambulatory Visit: Payer: Medicare Other

## 2018-10-23 ENCOUNTER — Ambulatory Visit: Payer: Medicare Other | Admitting: Physical Therapy

## 2018-10-23 ENCOUNTER — Other Ambulatory Visit: Payer: Self-pay

## 2018-10-23 DIAGNOSIS — R293 Abnormal posture: Secondary | ICD-10-CM

## 2018-10-23 DIAGNOSIS — I10 Essential (primary) hypertension: Secondary | ICD-10-CM | POA: Diagnosis not present

## 2018-10-23 DIAGNOSIS — M545 Low back pain, unspecified: Secondary | ICD-10-CM

## 2018-10-23 DIAGNOSIS — E559 Vitamin D deficiency, unspecified: Secondary | ICD-10-CM | POA: Diagnosis not present

## 2018-10-23 DIAGNOSIS — Z8249 Family history of ischemic heart disease and other diseases of the circulatory system: Secondary | ICD-10-CM | POA: Diagnosis not present

## 2018-10-23 DIAGNOSIS — G8929 Other chronic pain: Secondary | ICD-10-CM

## 2018-10-23 DIAGNOSIS — M546 Pain in thoracic spine: Secondary | ICD-10-CM | POA: Diagnosis not present

## 2018-10-23 DIAGNOSIS — M6281 Muscle weakness (generalized): Secondary | ICD-10-CM | POA: Diagnosis not present

## 2018-10-23 NOTE — Therapy (Signed)
[facility] [place], [place], [address] Phone: [phone]   Fax:  [phone]  Physical Therapy Treatment  Patient Details  Name: Lori Soto MRN: [id number] Date of Birth: 03-Dec-1935 Referring Provider (PT): Beau Fanny, [place]   Encounter Date: 10/23/2018  PT End of Session - 10/23/18 1145    Visit Number  11    Number of Visits  18    Date for PT Re-Evaluation  11/27/18    PT Start Time  1115    PT Stop Time  1211    PT Time Calculation (min)  56 min       Past Medical History:  Diagnosis Date  . Anemia   . Arthritis   . Cancer ([place])    skin cancer on nose  . Carpal tunnel syndrome, bilateral   . Cataract   . Esophagitis   . GERD (gastroesophageal reflux disease)   . H/O hiatal hernia   . Osteopenia   . Scoliosis   . Shingles   . SVT (supraventricular tachycardia) ([place])   . Ulcerative colitis     Past Surgical History:  Procedure Laterality Date  . ABDOMINAL HYSTERECTOMY    . BUNIONECTOMY     Bilateral  . CARPAL TUNNEL RELEASE Left 03/10/2015   Procedure: LEFT CARPAL TUNNEL RELEASE;  Surgeon: Daryll Brod, MD;  Location: [place];  Service: Orthopedics;  Laterality: Left;  . CARPAL TUNNEL RELEASE Right 10/31/2017   Procedure: RIGHT CARPAL TUNNEL RELEASE;  Surgeon: Daryll Brod, MD;  Location: [place];  Service: Orthopedics;  Laterality: Right;  . CATARACT EXTRACTION W/PHACO  05/28/2012   Procedure: CATARACT EXTRACTION PHACO AND INTRAOCULAR LENS PLACEMENT (IOC);  Surgeon: Tonny Branch, MD;  [facility];  Service: Ophthalmology;  Laterality: Right;  CDE=12.84  . CATARACT EXTRACTION W/PHACO  06/07/2012   Procedure: CATARACT EXTRACTION PHACO AND INTRAOCULAR LENS PLACEMENT (IOC);  Surgeon: Tonny Branch, MD;  [facility];  Service: Ophthalmology;  Laterality: Left;  CDE 17.60  . COLONOSCOPY    . COLONOSCOPY N/A 07/08/2015   Procedure: COLONOSCOPY;  Surgeon: Rogene Houston, MD;  [facility];  Service: Endoscopy;  Laterality: N/A;  930  . ESOPHAGEAL DILATION N/A 07/08/2015   Procedure: ESOPHAGEAL DILATION;  Surgeon: Rogene Houston, MD;  [facility];  Service: Endoscopy;  Laterality: N/A;  . ESOPHAGOGASTRODUODENOSCOPY N/A 07/08/2015   Procedure: ESOPHAGOGASTRODUODENOSCOPY (EGD);  Surgeon: Rogene Houston, MD;  [facility];  Service: Endoscopy;  Laterality: N/A;  . Hernia     Right inguinal  . HERNIA REPAIR  1961   right inguinal hernia  . UPPER GASTROINTESTINAL ENDOSCOPY      There were no vitals filed for this visit.  Subjective Assessment - 10/23/18 1144    Subjective  COVID-19 screen performed prior to patient entering clinic.  About the same.    Pertinent History  Scoliosis, hiatial hernia, osteopenia, allergy to tape    Limitations  Sitting;House hold activities;Standing;Walking    How long can you stand comfortably?  20 minutes    How long can you walk comfortably?  20-30 minutes max    Diagnostic tests  x-ray    Patient Stated Goals  improve ability to perform home activities with less pain    Currently in Pain?  Yes    Pain Score  8     Pain Location  Back    Pain Orientation  Right;Lower    Pain Descriptors /  Indicators  Discomfort;Aching    Pain Onset  More than a month ago                       [facility] Adult PT Treatment/Exercise - 10/23/18 0001      Moist Heat Therapy   Number Minutes Moist Heat  15 Minutes    Moist Heat Location  Lumbar Spine      Electrical Stimulation   Electrical Stimulation Location  Right LB    Electrical Stimulation Action  Pre-mod.    Electrical Stimulation Parameters  80-150 Hz x 15 minutes.    Electrical Stimulation Goals  Pain      Traction   Type of Traction  Lumbar    Min (lbs)  5    Max (lbs)  75    Hold Time  99    Rest Time  5    Time  15      Manual Therapy   Manual Therapy  Soft tissue mobilization    Soft tissue mobilization  STW/M  right QL release technique x 9 minutes with patient in left SDLY position.                  PT Long Term Goals - 07/10/18 1417      PT LONG TERM GOAL #1   Title  Patient will be independent with HEP    Baseline       Time  6    Period  Weeks    Status  On-going      PT LONG TERM GOAL #2   Title  Patient will report ability to perfrom ADLs with low back pain less than or equal to 4/10    Baseline       Time  6    Period  Weeks    Status  On-going      PT LONG TERM GOAL #3   Title  Patient will demonstrate 4/5 or greater bilateral LE MMT to improve stability during functional tasks.    Baseline       Time  6    Status  On-going      PT LONG TERM GOAL #4   Title  Patient will report ability to stand for 20 minutes or greater with low back pain less than or equal to 5/10 to improve ability to perform home tasks.     Time  6    Period  Weeks    Status  On-going            Plan - 10/23/18 1206    Clinical Impression Statement  Patient still has a great deal of tone in her right QL though she did well with STW/M today.  She tolerated a 5# increase in traction weight without complaint.    PT Treatment/Interventions  Electrical Stimulation;ADLs/Self Care Home Management;Cryotherapy;Traction;Ultrasound;Moist Heat;Iontophoresis 17m/ml Dexamethasone;Gait training;Stair training;Manual techniques;Dry needling;Passive range of motion;Balance training;Neuromuscular re-education;Therapeutic exercise;Therapeutic activities;Patient/family education    PT Next Visit Plan  Increase traction to 75#.    PT Home Exercise Plan  see patient education section    Consulted and Agree with Plan of Care  Patient       Patient will benefit from skilled therapeutic intervention in order to improve the following deficits and impairments:  Abnormal gait, Pain, Postural dysfunction, Decreased activity tolerance, Decreased range of motion, Decreased strength  Visit Diagnosis: 1. Chronic  bilateral low back pain without sciatica   2. Abnormal posture  Problem List Patient Active Problem List   Diagnosis Date Noted  . SVT (supraventricular tachycardia) ([place]) 08/28/2018  . Elevated troponin 08/28/2018  . Light headedness 05/09/2018  . Osteopenia of the elderly 02/19/2014  . DDD (degenerative disc disease), lumbar 12/03/2013  . Scoliosis 12/03/2013  . Chronic LBP 05/14/2013  . IBS (irritable bowel syndrome) 11/08/2012  . UC (ulcerative colitis) ([place]) 04/23/2012  . Palpitation 10/26/2011  . Hypertension 10/26/2011  . Chronic ulcerative colitis ([place]) 03/23/2011    , [place] MPT 10/23/2018, 12:15 PM  [facility] [address] [place], [place], [address] Phone: [phone]   Fax:  [phone]  Name: Lori Soto MRN: [id number] Date of Birth: 05-23-1935

## 2018-10-24 ENCOUNTER — Other Ambulatory Visit: Payer: Self-pay | Admitting: *Deleted

## 2018-10-24 DIAGNOSIS — E875 Hyperkalemia: Secondary | ICD-10-CM

## 2018-10-24 LAB — CBC WITH DIFFERENTIAL/PLATELET
Basophils Absolute: 0.1 10*3/uL (ref 0.0–0.2)
Basos: 1 %
EOS (ABSOLUTE): 0.1 10*3/uL (ref 0.0–0.4)
Eos: 2 %
Hematocrit: 40.1 % (ref 34.0–46.6)
Hemoglobin: 13.7 g/dL (ref 11.1–15.9)
Immature Grans (Abs): 0 10*3/uL (ref 0.0–0.1)
Immature Granulocytes: 0 %
Lymphocytes Absolute: 1.7 10*3/uL (ref 0.7–3.1)
Lymphs: 29 %
MCH: 30 pg (ref 26.6–33.0)
MCHC: 34.2 g/dL (ref 31.5–35.7)
MCV: 88 fL (ref 79–97)
Monocytes Absolute: 0.5 10*3/uL (ref 0.1–0.9)
Monocytes: 9 %
Neutrophils Absolute: 3.4 10*3/uL (ref 1.4–7.0)
Neutrophils: 59 %
Platelets: 256 10*3/uL (ref 150–450)
RBC: 4.56 x10E6/uL (ref 3.77–5.28)
RDW: 12.9 % (ref 11.7–15.4)
WBC: 5.8 10*3/uL (ref 3.4–10.8)

## 2018-10-24 LAB — LIPID PANEL
Chol/HDL Ratio: 3.3 ratio (ref 0.0–4.4)
Cholesterol, Total: 202 mg/dL — ABNORMAL HIGH (ref 100–199)
HDL: 62 mg/dL (ref >39)
LDL Calculated: 127 mg/dL — ABNORMAL HIGH (ref 0–99)
Triglycerides: 66 mg/dL (ref 0–149)
VLDL Cholesterol Cal: 13 mg/dL (ref 5–40)

## 2018-10-24 LAB — HEPATIC FUNCTION PANEL
ALT: 21 IU/L (ref 0–32)
AST: 19 IU/L (ref 0–40)
Albumin: 4.5 g/dL (ref 3.7–4.7)
Alkaline Phosphatase: 37 IU/L — ABNORMAL LOW (ref 39–117)
Bilirubin Total: 0.5 mg/dL (ref 0.0–1.2)
Bilirubin, Direct: 0.14 mg/dL (ref 0.00–0.40)
Total Protein: 7 g/dL (ref 6.0–8.5)

## 2018-10-24 LAB — BMP8+EGFR
BUN/Creatinine Ratio: 18 (ref 12–28)
BUN: 13 mg/dL (ref 8–27)
CO2: 27 mmol/L (ref 20–29)
Calcium: 10.4 mg/dL — ABNORMAL HIGH (ref 8.7–10.3)
Chloride: 103 mmol/L (ref 96–106)
Creatinine, Ser: 0.74 mg/dL (ref 0.57–1.00)
GFR calc Af Amer: 88 mL/min/{1.73_m2} (ref >59)
GFR calc non Af Amer: 77 mL/min/{1.73_m2} (ref >59)
Glucose: 90 mg/dL (ref 65–99)
Potassium: 5.4 mmol/L — ABNORMAL HIGH (ref 3.5–5.2)
Sodium: 143 mmol/L (ref 134–144)

## 2018-10-24 LAB — VITAMIN D 25 HYDROXY (VIT D DEFICIENCY, FRACTURES): Vit D, 25-Hydroxy: 51.8 ng/mL (ref 30.0–100.0)

## 2018-10-29 NOTE — Telephone Encounter (Signed)
Opened in error

## 2018-10-30 ENCOUNTER — Other Ambulatory Visit: Payer: Self-pay

## 2018-10-30 ENCOUNTER — Encounter: Payer: Self-pay | Admitting: Physical Therapy

## 2018-10-30 ENCOUNTER — Ambulatory Visit: Payer: Medicare Other | Admitting: Physical Therapy

## 2018-10-30 DIAGNOSIS — R293 Abnormal posture: Secondary | ICD-10-CM | POA: Diagnosis not present

## 2018-10-30 DIAGNOSIS — G8929 Other chronic pain: Secondary | ICD-10-CM

## 2018-10-30 DIAGNOSIS — M546 Pain in thoracic spine: Secondary | ICD-10-CM | POA: Diagnosis not present

## 2018-10-30 DIAGNOSIS — M6281 Muscle weakness (generalized): Secondary | ICD-10-CM | POA: Diagnosis not present

## 2018-10-30 DIAGNOSIS — M545 Low back pain: Secondary | ICD-10-CM | POA: Diagnosis not present

## 2018-10-30 NOTE — Therapy (Signed)
[facility] [place], [place], [address] Phone: [phone]   Fax:  [phone]  Physical Therapy Treatment  Patient Details  Name: Lori Soto MRN: [id number] Date of Birth: 1936/04/23 Referring Provider (PT): Beau Fanny, [place]   Encounter Date: 10/30/2018  PT End of Session - 10/30/18 1151    Visit Number  12    Number of Visits  18    Date for PT Re-Evaluation  11/27/18    PT Start Time  1116    PT Stop Time  1215    PT Time Calculation (min)  59 min    Activity Tolerance  Patient tolerated treatment well    Behavior During Therapy  [facility] for tasks assessed/performed       Past Medical History:  Diagnosis Date  . Anemia   . Arthritis   . Cancer ([place])    skin cancer on nose  . Carpal tunnel syndrome, bilateral   . Cataract   . Esophagitis   . GERD (gastroesophageal reflux disease)   . H/O hiatal hernia   . Osteopenia   . Scoliosis   . Shingles   . SVT (supraventricular tachycardia) ([place])   . Ulcerative colitis     Past Surgical History:  Procedure Laterality Date  . ABDOMINAL HYSTERECTOMY    . BUNIONECTOMY     Bilateral  . CARPAL TUNNEL RELEASE Left 03/10/2015   Procedure: LEFT CARPAL TUNNEL RELEASE;  Surgeon: Daryll Brod, MD;  Location: [place];  Service: Orthopedics;  Laterality: Left;  . CARPAL TUNNEL RELEASE Right 10/31/2017   Procedure: RIGHT CARPAL TUNNEL RELEASE;  Surgeon: Daryll Brod, MD;  Location: [place];  Service: Orthopedics;  Laterality: Right;  . CATARACT EXTRACTION W/PHACO  05/28/2012   Procedure: CATARACT EXTRACTION PHACO AND INTRAOCULAR LENS PLACEMENT (IOC);  Surgeon: Tonny Branch, MD;  [facility];  Service: Ophthalmology;  Laterality: Right;  CDE=12.84  . CATARACT EXTRACTION W/PHACO  06/07/2012   Procedure: CATARACT EXTRACTION PHACO AND INTRAOCULAR LENS PLACEMENT (IOC);  Surgeon: Tonny Branch, MD;  [facility];  Service: Ophthalmology;   Laterality: Left;  CDE 17.60  . COLONOSCOPY    . COLONOSCOPY N/A 07/08/2015   Procedure: COLONOSCOPY;  Surgeon: Rogene Houston, MD;  [facility];  Service: Endoscopy;  Laterality: N/A;  930  . ESOPHAGEAL DILATION N/A 07/08/2015   Procedure: ESOPHAGEAL DILATION;  Surgeon: Rogene Houston, MD;  [facility];  Service: Endoscopy;  Laterality: N/A;  . ESOPHAGOGASTRODUODENOSCOPY N/A 07/08/2015   Procedure: ESOPHAGOGASTRODUODENOSCOPY (EGD);  Surgeon: Rogene Houston, MD;  [facility];  Service: Endoscopy;  Laterality: N/A;  . Hernia     Right inguinal  . HERNIA REPAIR  1961   right inguinal hernia  . UPPER GASTROINTESTINAL ENDOSCOPY      There were no vitals filed for this visit.  Subjective Assessment - 10/30/18 1151    Subjective  COVID-19 screen performed prior to patient entering clinic.  Reports feeling sore after working in the yard yesterday.    Pertinent History  Scoliosis, hiatial hernia, osteopenia, allergy to tape    Limitations  Sitting;House hold activities;Standing;Walking    How long can you stand comfortably?  20 minutes    How long can you walk comfortably?  20-30 minutes max    Diagnostic tests  x-ray    Patient Stated Goals  improve ability to perform home activities with less pain    Currently in Pain?  Yes  Pain Score  8     Pain Location  Back    Pain Orientation  Right;Lower    Pain Descriptors / Indicators  Discomfort;Aching    Pain Type  Chronic pain    Pain Onset  More than a month ago    Pain Frequency  Constant         [facility] PT Assessment - 10/30/18 0001      Assessment   Medical Diagnosis  spondylosis without myelopathy or radiculopathy    Referring Provider (PT)  Beau Fanny, PA-C    Next MD Visit  End of March    Prior Therapy  yes      Precautions   Precautions  None      Restrictions   Weight Bearing Restrictions  No                   [facility] Adult PT Treatment/Exercise - 10/30/18 0001       Moist Heat Therapy   Number Minutes Moist Heat  10 Minutes    Moist Heat Location  Lumbar Spine      Electrical Stimulation   Electrical Stimulation Location  Right LB    Electrical Stimulation Action  pre-mod    Electrical Stimulation Parameters  80-150 hz x10 mins    Electrical Stimulation Goals  Pain      Traction   Type of Traction  Lumbar    Min (lbs)  5    Max (lbs)  75    Hold Time  99    Rest Time  5    Time  15      Manual Therapy   Manual Therapy  Soft tissue mobilization    Soft tissue mobilization  STW/M right QL release technique x 10 minutes with patient in left SDLY position.                  PT Long Term Goals - 10/30/18 1121      PT LONG TERM GOAL #1   Title  Patient will be independent with HEP    Time  6    Period  Weeks    Status  Achieved      PT LONG TERM GOAL #2   Title  Patient will report ability to perfrom ADLs with low back pain less than or equal to 4/10    Time  6    Period  Weeks    Status  On-going   slow to perform     PT LONG TERM GOAL #3   Title  Patient will demonstrate 4/5 or greater bilateral LE MMT to improve stability during functional tasks.    Time  6    Period  Weeks    Status  On-going      PT LONG TERM GOAL #4   Title  Patient will report ability to stand for 20 minutes or greater with low back pain less than or equal to 5/10 to improve ability to perform home tasks.     Time  6    Period  Weeks    Status  Partially Met   varies on the day but overall can complete           Plan - 10/30/18 1146    Clinical Impression Statement  Patient arrives to physical therapy with an increase of right sided low back pain from working in the garden yesterday. Patient's goals are ongoing at this time and feels that therapy helps her  for a couple of days then she feels an increase of pain and requires increased time to perform all ADLs. STW/M, e-stim, and traction performed per patient request. Patient denied averse  affects upon removal of modalities. 75# pull weight maintained with no reports of pain.    Stability/Clinical Decision Making  Evolving/Moderate complexity    Clinical Decision Making  Low    Rehab Potential  Good    PT Frequency  2x / week    PT Duration  6 weeks    PT Treatment/Interventions  Electrical Stimulation;ADLs/Self Care Home Management;Cryotherapy;Traction;Ultrasound;Moist Heat;Iontophoresis 15m/ml Dexamethasone;Gait training;Stair training;Manual techniques;Dry needling;Passive range of motion;Balance training;Neuromuscular re-education;Therapeutic exercise;Therapeutic activities;Patient/family education    PT Next Visit Plan  maintain traction at 75#.    PT Home Exercise Plan  see patient education section    Consulted and Agree with Plan of Care  Patient       Patient will benefit from skilled therapeutic intervention in order to improve the following deficits and impairments:  Abnormal gait, Pain, Postural dysfunction, Decreased activity tolerance, Decreased range of motion, Decreased strength  Visit Diagnosis: 1. Chronic bilateral low back pain without sciatica   2. Abnormal posture        Problem List Patient Active Problem List   Diagnosis Date Noted  . SVT (supraventricular tachycardia) ([place] 08/28/2018  . Elevated troponin 08/28/2018  . Light headedness 05/09/2018  . Osteopenia of the elderly 02/19/2014  . DDD (degenerative disc disease), lumbar 12/03/2013  . Scoliosis 12/03/2013  . Chronic LBP 05/14/2013  . IBS (irritable bowel syndrome) 11/08/2012  . UC (ulcerative colitis) ([place] 04/23/2012  . Palpitation 10/26/2011  . Hypertension 10/26/2011  . Chronic ulcerative colitis ([place] 03/23/2011   KGabriela Soto PT, DPT 10/30/2018, 12:50 PM  [facility] [address] [address]Phone: [phone]  Fax:  [phone] Name: BThecla ForgioneMRN: [id number] of Birth: 8Oct 21, 1937

## 2018-11-06 ENCOUNTER — Ambulatory Visit: Payer: Medicare Other | Attending: Orthopaedic Surgery | Admitting: Physical Therapy

## 2018-11-06 ENCOUNTER — Other Ambulatory Visit: Payer: Self-pay

## 2018-11-06 ENCOUNTER — Encounter: Payer: Self-pay | Admitting: Physical Therapy

## 2018-11-06 DIAGNOSIS — R293 Abnormal posture: Secondary | ICD-10-CM | POA: Diagnosis not present

## 2018-11-06 DIAGNOSIS — M6281 Muscle weakness (generalized): Secondary | ICD-10-CM | POA: Insufficient documentation

## 2018-11-06 DIAGNOSIS — G8929 Other chronic pain: Secondary | ICD-10-CM

## 2018-11-06 DIAGNOSIS — M545 Low back pain: Secondary | ICD-10-CM | POA: Insufficient documentation

## 2018-11-06 NOTE — Therapy (Signed)
[facility] [place], [place], [address] Phone: [phone]   Fax:  [phone]  Physical Therapy Treatment  Patient Details  Name: Lori Soto MRN: [id number] Date of Birth: 1935-11-05 Referring Provider (PT): Beau Fanny, [place]   Encounter Date: 11/06/2018  PT End of Session - 11/06/18 1404    Visit Number  13    Number of Visits  18    Date for PT Re-Evaluation  11/27/18    PT Start Time  1300    PT Stop Time  1400    PT Time Calculation (min)  60 min    Activity Tolerance  Patient tolerated treatment well    Behavior During Therapy  [facility] for tasks assessed/performed       Past Medical History:  Diagnosis Date  . Anemia   . Arthritis   . Cancer ([place])    skin cancer on nose  . Carpal tunnel syndrome, bilateral   . Cataract   . Esophagitis   . GERD (gastroesophageal reflux disease)   . H/O hiatal hernia   . Osteopenia   . Scoliosis   . Shingles   . SVT (supraventricular tachycardia) ([place])   . Ulcerative colitis     Past Surgical History:  Procedure Laterality Date  . ABDOMINAL HYSTERECTOMY    . BUNIONECTOMY     Bilateral  . CARPAL TUNNEL RELEASE Left 03/10/2015   Procedure: LEFT CARPAL TUNNEL RELEASE;  Surgeon: Daryll Brod, MD;  Location: [place];  Service: Orthopedics;  Laterality: Left;  . CARPAL TUNNEL RELEASE Right 10/31/2017   Procedure: RIGHT CARPAL TUNNEL RELEASE;  Surgeon: Daryll Brod, MD;  Location: [place];  Service: Orthopedics;  Laterality: Right;  . CATARACT EXTRACTION W/PHACO  05/28/2012   Procedure: CATARACT EXTRACTION PHACO AND INTRAOCULAR LENS PLACEMENT (IOC);  Surgeon: Tonny Branch, MD;  [facility];  Service: Ophthalmology;  Laterality: Right;  CDE=12.84  . CATARACT EXTRACTION W/PHACO  06/07/2012   Procedure: CATARACT EXTRACTION PHACO AND INTRAOCULAR LENS PLACEMENT (IOC);  Surgeon: Tonny Branch, MD;  [facility];  Service: Ophthalmology;   Laterality: Left;  CDE 17.60  . COLONOSCOPY    . COLONOSCOPY N/A 07/08/2015   Procedure: COLONOSCOPY;  Surgeon: Rogene Houston, MD;  [facility];  Service: Endoscopy;  Laterality: N/A;  930  . ESOPHAGEAL DILATION N/A 07/08/2015   Procedure: ESOPHAGEAL DILATION;  Surgeon: Rogene Houston, MD;  [facility];  Service: Endoscopy;  Laterality: N/A;  . ESOPHAGOGASTRODUODENOSCOPY N/A 07/08/2015   Procedure: ESOPHAGOGASTRODUODENOSCOPY (EGD);  Surgeon: Rogene Houston, MD;  [facility];  Service: Endoscopy;  Laterality: N/A;  . Hernia     Right inguinal  . HERNIA REPAIR  1961   right inguinal hernia  . UPPER GASTROINTESTINAL ENDOSCOPY      There were no vitals filed for this visit.  Subjective Assessment - 11/06/18 1330    Subjective  COVID-19 screen performed prior to patient entering clinic. Patient reports feeling like a "zombie" today due to doing a lot over the weekend.    Pertinent History  Scoliosis, hiatial hernia, osteopenia, allergy to tape    Limitations  Sitting;House hold activities;Standing;Walking    How long can you stand comfortably?  20 minutes    How long can you walk comfortably?  20-30 minutes max    Diagnostic tests  x-ray    Patient Stated Goals  improve ability to perform home activities with less pain  Currently in Pain?  Yes    Pain Score  7     Pain Location  Back    Pain Orientation  Right;Lower    Pain Descriptors / Indicators  Discomfort;Aching    Pain Type  Chronic pain    Pain Onset  More than a month ago    Pain Frequency  Constant         [facility] PT Assessment - 11/06/18 0001      Assessment   Medical Diagnosis  spondylosis without myelopathy or radiculopathy    Referring Provider (PT)  Beau Fanny, PA-C    Next MD Visit  End of March    Prior Therapy  yes      Precautions   Precautions  None      Restrictions   Weight Bearing Restrictions  No                   [facility] Adult PT  Treatment/Exercise - 11/06/18 0001      Moist Heat Therapy   Number Minutes Moist Heat  10 Minutes    Moist Heat Location  Lumbar Spine      Electrical Stimulation   Electrical Stimulation Location  Right LB    Electrical Stimulation Action  pre-mod    Electrical Stimulation Parameters  80-150 hz x10 mins    Electrical Stimulation Goals  Pain      Traction   Type of Traction  Lumbar    Min (lbs)  5    Max (lbs)  75    Hold Time  99    Rest Time  5    Time  15      Manual Therapy   Manual Therapy  Soft tissue mobilization    Soft tissue mobilization  STW/M right QL technique x 10 minutes to decrease tone                  PT Long Term Goals - 10/30/18 1121      PT LONG TERM GOAL #1   Title  Patient will be independent with HEP    Time  6    Period  Weeks    Status  Achieved      PT LONG TERM GOAL #2   Title  Patient will report ability to perfrom ADLs with low back pain less than or equal to 4/10    Time  6    Period  Weeks    Status  On-going   slow to perform     PT LONG TERM GOAL #3   Title  Patient will demonstrate 4/5 or greater bilateral LE MMT to improve stability during functional tasks.    Time  6    Period  Weeks    Status  On-going      PT LONG TERM GOAL #4   Title  Patient will report ability to stand for 20 minutes or greater with low back pain less than or equal to 5/10 to improve ability to perform home tasks.     Time  6    Period  Weeks    Status  Partially Met   varies on the day but overall can complete           Plan - 11/06/18 1353    Clinical Impression Statement  Patient was able to tolerate treatment fairly well but with ongoing rightsided low back pain. Patient noted with ongoing right QL tone upon STW/M; patient reported a decrease of  pain after STW/M. Patient educated on energy conservation and importance of perfoming most important activities first. Patient reported understanding. No adverse affects noted upon removal  of modalities.    Stability/Clinical Decision Making  Evolving/Moderate complexity    Clinical Decision Making  Low    Rehab Potential  Good    PT Frequency  2x / week    PT Duration  6 weeks    PT Treatment/Interventions  Electrical Stimulation;ADLs/Self Care Home Management;Cryotherapy;Traction;Ultrasound;Moist Heat;Iontophoresis 52m/ml Dexamethasone;Gait training;Stair training;Manual techniques;Dry needling;Passive range of motion;Balance training;Neuromuscular re-education;Therapeutic exercise;Therapeutic activities;Patient/family education    PT Next Visit Plan  maintain traction at 75#.    Consulted and Agree with Plan of Care  Patient       Patient will benefit from skilled therapeutic intervention in order to improve the following deficits and impairments:  Abnormal gait, Pain, Postural dysfunction, Decreased activity tolerance, Decreased range of motion, Decreased strength  Visit Diagnosis: 1. Chronic bilateral low back pain without sciatica   2. Abnormal posture   3. Muscle weakness (generalized)        Problem List Patient Active Problem List   Diagnosis Date Noted  . SVT (supraventricular tachycardia) ([place] 08/28/2018  . Elevated troponin 08/28/2018  . Light headedness 05/09/2018  . Osteopenia of the elderly 02/19/2014  . DDD (degenerative disc disease), lumbar 12/03/2013  . Scoliosis 12/03/2013  . Chronic LBP 05/14/2013  . IBS (irritable bowel syndrome) 11/08/2012  . UC (ulcerative colitis) ([place] 04/23/2012  . Palpitation 10/26/2011  . Hypertension 10/26/2011  . Chronic ulcerative colitis ([place] 03/23/2011   KGabriela Eves PT, DPT 11/06/2018, 2:31 PM  [facility] [address] [place] [address]Phone: [phone]  Fax:  [phone] Name: BMichalina CalbertMRN: [id number] of Birth: 809-Sep-1937

## 2018-11-12 NOTE — Progress Notes (Signed)
Cardiology Office Note   Date:  11/14/2018   ID:  Lori Soto, DOB 1936/03/15, MRN [id number]  PCP:  [name], DO  Cardiologist:   Minus Breeding, MD  Referring:  [name], DO  No chief complaint on file.     History of Present Illness: Lori Soto is a 83 y.o. female who presents for evaluation of palpitations.   I saw her for evaluation of palpitations in 2014.  She has a heart murmur and an echo in 2011 was normal.  She was in in theED in August.2018but there was no recorded arrhythmia. She had palpitations but these were infrequent so no further work up was suggested.   She was in the ED earlier this year twice with SVT. She had mildly elevated troponin. She had an SVT with a rate of 174She was treated with adenosine. After the second visit she wasgiven a prescription for metoprolol 25 mg twice daily but she did not want to take this until checking with [place]. I talked to her about titrating the beta blocker and the possibility of ablation.  She wanted to try medications.   The patient denies any new symptoms such as chest discomfort, neck or arm discomfort. There has been no new shortness of breath, PND or orthopnea. There have been no reported palpitations, presyncope or syncope.   She is very active.  She has had no sustained episodes and has not had to use Valsalva or take extra medications.  Past Medical History:  Diagnosis Date  . Anemia   . Arthritis   . Cancer ([place])    skin cancer on nose  . Carpal tunnel syndrome, bilateral   . Cataract   . Esophagitis   . GERD (gastroesophageal reflux disease)   . H/O hiatal hernia   . Osteopenia   . Scoliosis   . Shingles   . SVT (supraventricular tachycardia) ([place])   . Ulcerative colitis     Past Surgical History:  Procedure Laterality Date  . ABDOMINAL HYSTERECTOMY    . BUNIONECTOMY     Bilateral  . CARPAL TUNNEL RELEASE Left 03/10/2015   Procedure: LEFT CARPAL TUNNEL RELEASE;   Surgeon: Daryll Brod, MD;  Location: [place];  Service: Orthopedics;  Laterality: Left;  . CARPAL TUNNEL RELEASE Right 10/31/2017   Procedure: RIGHT CARPAL TUNNEL RELEASE;  Surgeon: Daryll Brod, MD;  Location: [place];  Service: Orthopedics;  Laterality: Right;  . CATARACT EXTRACTION W/PHACO  05/28/2012   Procedure: CATARACT EXTRACTION PHACO AND INTRAOCULAR LENS PLACEMENT (IOC);  Surgeon: Tonny Branch, MD;  [facility];  Service: Ophthalmology;  Laterality: Right;  CDE=12.84  . CATARACT EXTRACTION W/PHACO  06/07/2012   Procedure: CATARACT EXTRACTION PHACO AND INTRAOCULAR LENS PLACEMENT (IOC);  Surgeon: Tonny Branch, MD;  [facility];  Service: Ophthalmology;  Laterality: Left;  CDE 17.60  . COLONOSCOPY    . COLONOSCOPY N/A 07/08/2015   Procedure: COLONOSCOPY;  Surgeon: Rogene Houston, MD;  [facility];  Service: Endoscopy;  Laterality: N/A;  930  . ESOPHAGEAL DILATION N/A 07/08/2015   Procedure: ESOPHAGEAL DILATION;  Surgeon: Rogene Houston, MD;  [facility];  Service: Endoscopy;  Laterality: N/A;  . ESOPHAGOGASTRODUODENOSCOPY N/A 07/08/2015   Procedure: ESOPHAGOGASTRODUODENOSCOPY (EGD);  Surgeon: Rogene Houston, MD;  [facility];  Service: Endoscopy;  Laterality: N/A;  . Hernia     Right inguinal  . HERNIA REPAIR  1961   right  inguinal hernia  . UPPER GASTROINTESTINAL ENDOSCOPY       Current Outpatient Medications  Medication Sig Dispense Refill  . acetaminophen (TYLENOL 8 HOUR ARTHRITIS PAIN) 650 MG CR tablet Take 650 mg by mouth every 8 (eight) hours as needed. Patient states that she takes prn for arthritis in her knees.     . Bromelains 500 MG TABS Take 500 mg by mouth daily.    [name] 500 MG CAPS Take 1 capsule by mouth daily.     . Coenzyme Q10 (CO Q-10 PO) Take 1 tablet by mouth daily. With red yeast rice [employer]    . COLLAGEN PO Take 1 tablet by mouth daily. This is known as 1-2-3    .  Cyanocobalamin (VITAMIN B 12 PO) Take 1,000 mcg by mouth daily.     . Ginkgo Biloba 40 MG TABS Take 40 mg by mouth daily.     [name] glucosamine-chondroitin 500-400 MG tablet Take 1 tablet by mouth 2 (two) times daily.      [name] Hyaluronic Acid-Vitamin C (HYALURONIC ACID PO) Take 80 mg by mouth daily.    . Mesalamine (ASACOL) 400 MG CPDR DR capsule Take 2 capsules (800 mg total) by mouth daily. 180 capsule 3  . metoprolol tartrate (LOPRESSOR) 25 MG tablet Take 1 tablet (25 mg total) by mouth 2 (two) times daily. (Patient taking differently: Take 25 mg by mouth 2 (two) times daily. Take one table by mouth in the morning and 1/2 tablet in the evening) 180 tablet 3  . MILK THISTLE PO Take 100 mg by mouth daily.    . MULTIPLE VITAMINS PO Take 1 tablet by mouth daily. In the mornings    . Nutritional Supplements (GRAPESEED EXTRACT PO) Take 60 mg by mouth daily.     [name] OAT BRAN SOLUBLE PO Take 1 tablet by mouth daily.    [name] OVER THE COUNTER MEDICATION Take 66 mg by mouth daily. Vision Gold Lutein    . OVER THE COUNTER MEDICATION Take 250 mg by mouth daily. Alphalipoic Acid 250 mg daily    . pantoprazole (PROTONIX) 40 MG tablet TAKE 1 TABLET DAILY BEFORE BREAKFAST (Patient taking differently: Take 40 mg by mouth every morning. ) 90 tablet 3  . pyridOXINE (VITAMIN B-6) 100 MG tablet Take 100 mg by mouth daily.     . Turmeric 500 MG CAPS Take 1 capsule by mouth 3 (three) times daily.     . Vitamins C E (CRANBERRY CONCENTRATE PO) Take 2,500 mg by mouth daily.     . Zinc 50 MG CAPS Take 50 mg by mouth daily.     . Calcium Carbonate-Vitamin D (CALCIUM 600+D) 600-400 MG-UNIT per tablet Take 1 tablet by mouth every evening. (Patient not taking: Reported on 11/14/2018)     No current facility-administered medications for this visit.     Allergies:   Penicillins, Tape, and Influenza virus vacc split pf    ROS:  Please see the history of present illness.   Otherwise, review of systems are positive for none.   All other  systems are reviewed and negative.    PHYSICAL EXAM: VS:  BP 138/80   Pulse 60   Ht 4' 11"  (1.499 m)   Wt 131 lb (59.4 kg)   BMI 26.46 kg/m  , BMI Body mass index is 26.46 kg/m. GENERAL:  Well appearing NECK:  No jugular venous distention, waveform within normal limits, carotid upstroke brisk and symmetric, no bruits, no thyromegaly LUNGS:  Clear to auscultation bilaterally BACK:  No CVA tenderness CHEST:  Unremarkable HEART:  PMI not displaced or sustained,S1 and S2 within normal limits, no S3, no S4, no clicks, no rubs, no murmurs ABD:  Flat, positive bowel sounds normal in frequency in pitch, no bruits, no rebound, no guarding, no midline pulsatile mass, no hepatomegaly, no splenomegaly EXT:  2 plus pulses throughout, no edema, no cyanosis no clubbing   EKG:  EKG is not ordered today.   Recent Labs: 08/24/2018: Magnesium 1.9; TSH 0.829 10/23/2018: ALT 21; BUN 13; Creatinine, Ser 0.74; Hemoglobin 13.7; Platelets 256; Potassium 5.4; Sodium 143    Lipid Panel    Component Value Date/Time   CHOL 202 (H) 10/23/2018 1312   TRIG 66 10/23/2018 1312   TRIG 79 12/18/2015 1105   HDL 62 10/23/2018 1312   HDL 65 12/18/2015 1105   CHOLHDL 3.3 10/23/2018 1312   LDLCALC 127 (H) 10/23/2018 1312   LDLCALC 109 (H) 11/29/2013 0822      Wt Readings from Last 3 Encounters:  11/14/18 131 lb (59.4 kg)  09/14/18 132 lb (59.9 kg)  08/28/18 130 lb (59 kg)      Other studies Reviewed: Additional studies/ records that were reviewed today include: None Review of the above records demonstrates:     ASSESSMENT AND PLAN:  SVT:  She had no further sustained arrhythmias.  No change in therapy.  She could take an extra half of metoprolol if he has any palpitations and we discussed this.  No indication for ablation at this point.  ELEVATED TROPONIN: This was in the hospital and secondary to demand ischemia.  She has no recurrent chest pain.  No change in therapy.   Current medicines are  reviewed at length with the patient today.  The patient does not have concerns regarding medicines.  The following changes have been made:  None  Labs/ tests ordered today include: None  No orders of the defined types were placed in this encounter.    Disposition:   FU with me in 6 months either in real-life or surgery.Ronnell Guadalajara, MD  11/14/2018 11:39 AM    [place]

## 2018-11-14 ENCOUNTER — Other Ambulatory Visit: Payer: Medicare Other

## 2018-11-14 ENCOUNTER — Ambulatory Visit: Payer: Medicare Other | Admitting: Cardiology

## 2018-11-14 ENCOUNTER — Encounter: Payer: Self-pay | Admitting: Physical Therapy

## 2018-11-14 ENCOUNTER — Other Ambulatory Visit: Payer: Self-pay

## 2018-11-14 ENCOUNTER — Ambulatory Visit (INDEPENDENT_AMBULATORY_CARE_PROVIDER_SITE_OTHER): Payer: Medicare Other | Admitting: Cardiology

## 2018-11-14 ENCOUNTER — Ambulatory Visit: Payer: Medicare Other | Admitting: Physical Therapy

## 2018-11-14 ENCOUNTER — Encounter: Payer: Self-pay | Admitting: Cardiology

## 2018-11-14 VITALS — BP 138/80 | HR 60 | Ht 59.0 in | Wt 131.0 lb

## 2018-11-14 DIAGNOSIS — I471 Supraventricular tachycardia: Secondary | ICD-10-CM | POA: Diagnosis not present

## 2018-11-14 DIAGNOSIS — M6281 Muscle weakness (generalized): Secondary | ICD-10-CM | POA: Diagnosis not present

## 2018-11-14 DIAGNOSIS — R293 Abnormal posture: Secondary | ICD-10-CM

## 2018-11-14 DIAGNOSIS — G8929 Other chronic pain: Secondary | ICD-10-CM

## 2018-11-14 DIAGNOSIS — E875 Hyperkalemia: Secondary | ICD-10-CM | POA: Diagnosis not present

## 2018-11-14 DIAGNOSIS — M545 Low back pain: Secondary | ICD-10-CM | POA: Diagnosis not present

## 2018-11-14 NOTE — Therapy (Signed)
[facility] [place], [place], [address] Phone: [phone]   Fax:  [phone]  Physical Therapy Treatment  Patient Details  Name: Lori Soto MRN: [id number] Date of Birth: 1936-05-01 Referring Provider (PT): Beau Fanny, [place]   Encounter Date: 11/14/2018  PT End of Session - 11/14/18 1359    Visit Number  14    Number of Visits  18    Date for PT Re-Evaluation  11/27/18    PT Start Time  1300    PT Stop Time  1406    PT Time Calculation (min)  66 min    Activity Tolerance  Patient tolerated treatment well    Behavior During Therapy  [facility] for tasks assessed/performed       Past Medical History:  Diagnosis Date  . Anemia   . Arthritis   . Cancer ([place])    skin cancer on nose  . Carpal tunnel syndrome, bilateral   . Cataract   . Esophagitis   . GERD (gastroesophageal reflux disease)   . H/O hiatal hernia   . Osteopenia   . Scoliosis   . Shingles   . SVT (supraventricular tachycardia) ([place])   . Ulcerative colitis     Past Surgical History:  Procedure Laterality Date  . ABDOMINAL HYSTERECTOMY    . BUNIONECTOMY     Bilateral  . CARPAL TUNNEL RELEASE Left 03/10/2015   Procedure: LEFT CARPAL TUNNEL RELEASE;  Surgeon: Daryll Brod, MD;  Location: [place];  Service: Orthopedics;  Laterality: Left;  . CARPAL TUNNEL RELEASE Right 10/31/2017   Procedure: RIGHT CARPAL TUNNEL RELEASE;  Surgeon: Daryll Brod, MD;  Location: [place];  Service: Orthopedics;  Laterality: Right;  . CATARACT EXTRACTION W/PHACO  05/28/2012   Procedure: CATARACT EXTRACTION PHACO AND INTRAOCULAR LENS PLACEMENT (IOC);  Surgeon: Tonny Branch, MD;  [facility];  Service: Ophthalmology;  Laterality: Right;  CDE=12.84  . CATARACT EXTRACTION W/PHACO  06/07/2012   Procedure: CATARACT EXTRACTION PHACO AND INTRAOCULAR LENS PLACEMENT (IOC);  Surgeon: Tonny Branch, MD;  [facility];  Service: Ophthalmology;   Laterality: Left;  CDE 17.60  . COLONOSCOPY    . COLONOSCOPY N/A 07/08/2015   Procedure: COLONOSCOPY;  Surgeon: Rogene Houston, MD;  [facility];  Service: Endoscopy;  Laterality: N/A;  930  . ESOPHAGEAL DILATION N/A 07/08/2015   Procedure: ESOPHAGEAL DILATION;  Surgeon: Rogene Houston, MD;  [facility];  Service: Endoscopy;  Laterality: N/A;  . ESOPHAGOGASTRODUODENOSCOPY N/A 07/08/2015   Procedure: ESOPHAGOGASTRODUODENOSCOPY (EGD);  Surgeon: Rogene Houston, MD;  [facility];  Service: Endoscopy;  Laterality: N/A;  . Hernia     Right inguinal  . HERNIA REPAIR  1961   right inguinal hernia  . UPPER GASTROINTESTINAL ENDOSCOPY      There were no vitals filed for this visit.  Subjective Assessment - 11/14/18 1351    Subjective  COVID-19 screen performed prior to patient entering clinic. Patient reports feeling okay but is feeling ongoing pain.    Pertinent History  Scoliosis, hiatial hernia, osteopenia, allergy to tape    Limitations  Sitting;House hold activities;Standing;Walking    How long can you stand comfortably?  20 minutes    How long can you walk comfortably?  20-30 minutes max    Diagnostic tests  x-ray    Patient Stated Goals  improve ability to perform home activities with less pain    Currently in Pain?  Yes  no pain scale provided        [facility] PT Assessment - 11/14/18 0001      Assessment   Medical Diagnosis  spondylosis without myelopathy or radiculopathy    Referring Provider (PT)  Beau Fanny, PA-C    Prior Therapy  yes                   [place] Adult PT Treatment/Exercise - 11/14/18 0001      Moist Heat Therapy   Number Minutes Moist Heat  10 Minutes    Moist Heat Location  Lumbar Spine      Electrical Stimulation   Electrical Stimulation Location  right LB    Electrical Stimulation Action  pre-mod    Electrical Stimulation Parameters  80-150 hz x10 mins    Electrical Stimulation Goals  Pain       Traction   Type of Traction  Lumbar    Min (lbs)  5    Max (lbs)  75    Hold Time  99    Rest Time  5    Time  15      Manual Therapy   Manual Therapy  Soft tissue mobilization    Soft tissue mobilization  STW/M right QL technique x 10 minutes to decrease tone                  PT Long Term Goals - 10/30/18 1121      PT LONG TERM GOAL #1   Title  Patient will be independent with HEP    Time  6    Period  Weeks    Status  Achieved      PT LONG TERM GOAL #2   Title  Patient will report ability to perfrom ADLs with low back pain less than or equal to 4/10    Time  6    Period  Weeks    Status  On-going   slow to perform     PT LONG TERM GOAL #3   Title  Patient will demonstrate 4/5 or greater bilateral LE MMT to improve stability during functional tasks.    Time  6    Period  Weeks    Status  On-going      PT LONG TERM GOAL #4   Title  Patient will report ability to stand for 20 minutes or greater with low back pain less than or equal to 5/10 to improve ability to perform home tasks.     Time  6    Period  Weeks    Status  Partially Met   varies on the day but overall can complete           Plan - 11/14/18 1400    Clinical Impression Statement  Patient was able to tolerate treatment fairly well with no increases of pain. Patient reported more tenderness along upper gluteal and QL area today with STW/M. Patient noted with no adverse affects upon removal of modalities.    Stability/Clinical Decision Making  Evolving/Moderate complexity    Clinical Decision Making  Low    Rehab Potential  Good    PT Frequency  2x / week    PT Duration  6 weeks    PT Treatment/Interventions  Electrical Stimulation;ADLs/Self Care Home Management;Cryotherapy;Traction;Ultrasound;Moist Heat;Iontophoresis 27m/ml Dexamethasone;Gait training;Stair training;Manual techniques;Dry needling;Passive range of motion;Balance training;Neuromuscular re-education;Therapeutic  exercise;Therapeutic activities;Patient/family education    PT Next Visit Plan  maintain traction at 75# cont STW/M to right low back  PT Home Exercise Plan  see patient education section    Consulted and Agree with Plan of Care  Patient       Patient will benefit from skilled therapeutic intervention in order to improve the following deficits and impairments:  Abnormal gait, Pain, Postural dysfunction, Decreased activity tolerance, Decreased range of motion, Decreased strength  Visit Diagnosis: 1. Chronic bilateral low back pain without sciatica   2. Abnormal posture        Problem List Patient Active Problem List   Diagnosis Date Noted  . SVT (supraventricular tachycardia) ([place]) 08/28/2018  . Elevated troponin 08/28/2018  . Light headedness 05/09/2018  . Osteopenia of the elderly 02/19/2014  . DDD (degenerative disc disease), lumbar 12/03/2013  . Scoliosis 12/03/2013  . Chronic LBP 05/14/2013  . IBS (irritable bowel syndrome) 11/08/2012  . UC (ulcerative colitis) ([place]) 04/23/2012  . Palpitation 10/26/2011  . Hypertension 10/26/2011  . Chronic ulcerative colitis ([place]) 03/23/2011   Gabriela Eves, PT, DPT 11/14/2018, 2:25 PM  [facility] [address] [place], [place], [address] Phone: [phone]   Fax:  [phone]  Name: Lori Soto MRN: [id number] Date of Birth: 1935-06-23

## 2018-11-14 NOTE — Patient Instructions (Signed)
Medication Instructions:  The current medical regimen is effective;  continue present plan and medications.  If you need a refill on your cardiac medications before your next appointment, please call your pharmacy.   Follow-Up: Follow up in 6 months with Dr. Percival Spanish.  You will receive a letter in the mail 2 months before you are due.  Please call us when you receive this letter to schedule your follow up appointment.  Thank you for choosing [facility]!!

## 2018-11-15 LAB — BMP8+EGFR
BUN/Creatinine Ratio: 17 (ref 12–28)
BUN: 14 mg/dL (ref 8–27)
CO2: 28 mmol/L (ref 20–29)
Calcium: 10.2 mg/dL (ref 8.7–10.3)
Chloride: 100 mmol/L (ref 96–106)
Creatinine, Ser: 0.83 mg/dL (ref 0.57–1.00)
GFR calc Af Amer: 77 mL/min/{1.73_m2} (ref >59)
GFR calc non Af Amer: 67 mL/min/{1.73_m2} (ref >59)
Glucose: 80 mg/dL (ref 65–99)
Potassium: 5.6 mmol/L — ABNORMAL HIGH (ref 3.5–5.2)
Sodium: 143 mmol/L (ref 134–144)

## 2018-11-16 ENCOUNTER — Encounter: Payer: Self-pay | Admitting: Family Medicine

## 2018-11-20 ENCOUNTER — Other Ambulatory Visit: Payer: Self-pay

## 2018-11-20 ENCOUNTER — Ambulatory Visit: Payer: Medicare Other | Admitting: Physical Therapy

## 2018-11-20 ENCOUNTER — Encounter: Payer: Self-pay | Admitting: Physical Therapy

## 2018-11-20 DIAGNOSIS — G8929 Other chronic pain: Secondary | ICD-10-CM | POA: Diagnosis not present

## 2018-11-20 DIAGNOSIS — R293 Abnormal posture: Secondary | ICD-10-CM | POA: Diagnosis not present

## 2018-11-20 DIAGNOSIS — M545 Low back pain, unspecified: Secondary | ICD-10-CM

## 2018-11-20 DIAGNOSIS — M6281 Muscle weakness (generalized): Secondary | ICD-10-CM | POA: Diagnosis not present

## 2018-11-20 NOTE — Therapy (Signed)
[facility] [place], [place], [address] Phone: [phone]   Fax:  [phone]  Physical Therapy Treatment  Patient Details  Name: Lori Soto MRN: [id number] Date of Birth: Feb 18, 1936 Referring Provider (PT): Beau Fanny, [place]   Encounter Date: 11/20/2018  PT End of Session - 11/20/18 1217    Visit Number  14    Number of Visits  18    Date for PT Re-Evaluation  11/27/18    PT Start Time  1115    PT Stop Time  1217    PT Time Calculation (min)  62 min    Activity Tolerance  Patient tolerated treatment well    Behavior During Therapy  [facility] for tasks assessed/performed       Past Medical History:  Diagnosis Date  . Anemia   . Arthritis   . Cancer ([place])    skin cancer on nose  . Carpal tunnel syndrome, bilateral   . Cataract   . Esophagitis   . GERD (gastroesophageal reflux disease)   . H/O hiatal hernia   . Osteopenia   . Scoliosis   . Shingles   . SVT (supraventricular tachycardia) ([place])   . Ulcerative colitis     Past Surgical History:  Procedure Laterality Date  . ABDOMINAL HYSTERECTOMY    . BUNIONECTOMY     Bilateral  . CARPAL TUNNEL RELEASE Left 03/10/2015   Procedure: LEFT CARPAL TUNNEL RELEASE;  Surgeon: Daryll Brod, MD;  Location: [place];  Service: Orthopedics;  Laterality: Left;  . CARPAL TUNNEL RELEASE Right 10/31/2017   Procedure: RIGHT CARPAL TUNNEL RELEASE;  Surgeon: Daryll Brod, MD;  Location: [place];  Service: Orthopedics;  Laterality: Right;  . CATARACT EXTRACTION W/PHACO  05/28/2012   Procedure: CATARACT EXTRACTION PHACO AND INTRAOCULAR LENS PLACEMENT (IOC);  Surgeon: Tonny Branch, MD;  [facility];  Service: Ophthalmology;  Laterality: Right;  CDE=12.84  . CATARACT EXTRACTION W/PHACO  06/07/2012   Procedure: CATARACT EXTRACTION PHACO AND INTRAOCULAR LENS PLACEMENT (IOC);  Surgeon: Tonny Branch, MD;  [facility];  Service: Ophthalmology;   Laterality: Left;  CDE 17.60  . COLONOSCOPY    . COLONOSCOPY N/A 07/08/2015   Procedure: COLONOSCOPY;  Surgeon: Rogene Houston, MD;  [facility];  Service: Endoscopy;  Laterality: N/A;  930  . ESOPHAGEAL DILATION N/A 07/08/2015   Procedure: ESOPHAGEAL DILATION;  Surgeon: Rogene Houston, MD;  [facility];  Service: Endoscopy;  Laterality: N/A;  . ESOPHAGOGASTRODUODENOSCOPY N/A 07/08/2015   Procedure: ESOPHAGOGASTRODUODENOSCOPY (EGD);  Surgeon: Rogene Houston, MD;  [facility];  Service: Endoscopy;  Laterality: N/A;  . Hernia     Right inguinal  . HERNIA REPAIR  1961   right inguinal hernia  . UPPER GASTROINTESTINAL ENDOSCOPY      There were no vitals filed for this visit.  Subjective Assessment - 11/20/18 1213    Subjective  COVID-19 screen performed prior to patient entering clinic. Patient reported "close to a 10 today."    Pertinent History  Scoliosis, hiatial hernia, osteopenia, allergy to tape    Limitations  Sitting;House hold activities;Standing;Walking    How long can you stand comfortably?  20 minutes    How long can you walk comfortably?  20-30 minutes max    Diagnostic tests  x-ray    Patient Stated Goals  improve ability to perform home activities with less pain    Currently in Pain?  Yes  Pain Score  9     Pain Location  Back    Pain Orientation  Right;Left    Pain Descriptors / Indicators  Discomfort;Aching    Pain Type  Chronic pain    Pain Onset  More than a month ago    Pain Frequency  Constant                                    PT Long Term Goals - 10/30/18 1121      PT LONG TERM GOAL #1   Title  Patient will be independent with HEP    Time  6    Period  Weeks    Status  Achieved      PT LONG TERM GOAL #2   Title  Patient will report ability to perfrom ADLs with low back pain less than or equal to 4/10    Time  6    Period  Weeks    Status  On-going   slow to perform     PT LONG TERM  GOAL #3   Title  Patient will demonstrate 4/5 or greater bilateral LE MMT to improve stability during functional tasks.    Time  6    Period  Weeks    Status  On-going      PT LONG TERM GOAL #4   Title  Patient will report ability to stand for 20 minutes or greater with low back pain less than or equal to 5/10 to improve ability to perform home tasks.     Time  6    Period  Weeks    Status  Partially Met   varies on the day but overall can complete           Plan - 11/20/18 1217    Clinical Impression Statement  Patient was able to tolerate treatment fairly well despite feeling severe pain today. Patient with more tension in right QL today. No adverse affects to e-stim or traction upon removal.    Stability/Clinical Decision Making  Evolving/Moderate complexity    Clinical Decision Making  Low    Rehab Potential  Good    PT Frequency  2x / week    PT Duration  6 weeks    PT Treatment/Interventions  Electrical Stimulation;ADLs/Self Care Home Management;Cryotherapy;Traction;Ultrasound;Moist Heat;Iontophoresis 6m/ml Dexamethasone;Gait training;Stair training;Manual techniques;Dry needling;Passive range of motion;Balance training;Neuromuscular re-education;Therapeutic exercise;Therapeutic activities;Patient/family education    PT Next Visit Plan  maintain traction at 75# cont STW/M to right low back    Consulted and Agree with Plan of Care  Patient       Patient will benefit from skilled therapeutic intervention in order to improve the following deficits and impairments:  Abnormal gait, Pain, Postural dysfunction, Decreased activity tolerance, Decreased range of motion, Decreased strength  Visit Diagnosis: 1. Chronic bilateral low back pain without sciatica   2. Abnormal posture   3. Muscle weakness (generalized)        Problem List Patient Active Problem List   Diagnosis Date Noted  . SVT (supraventricular tachycardia) ([place] 08/28/2018  . Elevated troponin 08/28/2018  .  Light headedness 05/09/2018  . Osteopenia of the elderly 02/19/2014  . DDD (degenerative disc disease), lumbar 12/03/2013  . Scoliosis 12/03/2013  . Chronic LBP 05/14/2013  . IBS (irritable bowel syndrome) 11/08/2012  . UC (ulcerative colitis) ([place] 04/23/2012  . Palpitation 10/26/2011  . Hypertension 10/26/2011  . Chronic ulcerative  colitis ([place]) 03/23/2011   Gabriela Eves, PT, DPT 11/20/2018, 12:21 PM  [facility] [address] [place], [place], [address] Phone: [phone]   Fax:  [phone]  Name: Lori Soto MRN: [id number] Date of Birth: 02/13/1936

## 2018-11-23 ENCOUNTER — Other Ambulatory Visit: Payer: Self-pay

## 2018-11-23 ENCOUNTER — Other Ambulatory Visit: Payer: Medicare Other

## 2018-11-23 DIAGNOSIS — E875 Hyperkalemia: Secondary | ICD-10-CM | POA: Diagnosis not present

## 2018-11-23 LAB — BMP8+EGFR
BUN/Creatinine Ratio: 12 (ref 12–28)
BUN: 10 mg/dL (ref 8–27)
CO2: 25 mmol/L (ref 20–29)
Calcium: 9.8 mg/dL (ref 8.7–10.3)
Chloride: 101 mmol/L (ref 96–106)
Creatinine, Ser: 0.86 mg/dL (ref 0.57–1.00)
GFR calc Af Amer: 74 mL/min/{1.73_m2} (ref >59)
GFR calc non Af Amer: 64 mL/min/{1.73_m2} (ref >59)
Glucose: 80 mg/dL (ref 65–99)
Potassium: 4.9 mmol/L (ref 3.5–5.2)
Sodium: 141 mmol/L (ref 134–144)

## 2018-11-27 ENCOUNTER — Other Ambulatory Visit: Payer: Self-pay

## 2018-11-27 ENCOUNTER — Ambulatory Visit: Payer: Medicare Other | Admitting: Physical Therapy

## 2018-11-27 ENCOUNTER — Telehealth (INDEPENDENT_AMBULATORY_CARE_PROVIDER_SITE_OTHER): Payer: Self-pay | Admitting: *Deleted

## 2018-11-27 ENCOUNTER — Encounter: Payer: Self-pay | Admitting: Physical Therapy

## 2018-11-27 DIAGNOSIS — R293 Abnormal posture: Secondary | ICD-10-CM | POA: Diagnosis not present

## 2018-11-27 DIAGNOSIS — G8929 Other chronic pain: Secondary | ICD-10-CM | POA: Diagnosis not present

## 2018-11-27 DIAGNOSIS — M6281 Muscle weakness (generalized): Secondary | ICD-10-CM | POA: Diagnosis not present

## 2018-11-27 DIAGNOSIS — M545 Low back pain: Secondary | ICD-10-CM | POA: Diagnosis not present

## 2018-11-27 NOTE — Therapy (Signed)
[facility] [place], [place], [address] Phone: [phone]   Fax:  [phone]  Physical Therapy Treatment  Patient Details  Name: Lori Soto MRN: [id number] Date of Birth: July 20, 1935 Referring Provider (PT): Beau Fanny, [place]   Encounter Date: 11/27/2018  PT End of Session - 11/27/18 1118    Visit Number  15    Number of Visits  18    Date for PT Re-Evaluation  12/28/18    PT Start Time  1118    PT Stop Time  1209    PT Time Calculation (min)  51 min    Activity Tolerance  Patient tolerated treatment well    Behavior During Therapy  [id number] for tasks assessed/performed       Past Medical History:  Diagnosis Date  . Anemia   . Arthritis   . Cancer ([place])    skin cancer on nose  . Carpal tunnel syndrome, bilateral   . Cataract   . Esophagitis   . GERD (gastroesophageal reflux disease)   . H/O hiatal hernia   . Osteopenia   . Scoliosis   . Shingles   . SVT (supraventricular tachycardia) ([place])   . Ulcerative colitis     Past Surgical History:  Procedure Laterality Date  . ABDOMINAL HYSTERECTOMY    . BUNIONECTOMY     Bilateral  . CARPAL TUNNEL RELEASE Left 03/10/2015   Procedure: LEFT CARPAL TUNNEL RELEASE;  Surgeon: Daryll Brod, MD;  Location: [place];  Service: Orthopedics;  Laterality: Left;  . CARPAL TUNNEL RELEASE Right 10/31/2017   Procedure: RIGHT CARPAL TUNNEL RELEASE;  Surgeon: Daryll Brod, MD;  Location: [place];  Service: Orthopedics;  Laterality: Right;  . CATARACT EXTRACTION W/PHACO  05/28/2012   Procedure: CATARACT EXTRACTION PHACO AND INTRAOCULAR LENS PLACEMENT (IOC);  Surgeon: Tonny Branch, MD;  [facility];  Service: Ophthalmology;  Laterality: Right;  CDE=12.84  . CATARACT EXTRACTION W/PHACO  06/07/2012   Procedure: CATARACT EXTRACTION PHACO AND INTRAOCULAR LENS PLACEMENT (IOC);  Surgeon: Tonny Branch, MD;  [facility];  Service: Ophthalmology;   Laterality: Left;  CDE 17.60  . COLONOSCOPY    . COLONOSCOPY N/A 07/08/2015   Procedure: COLONOSCOPY;  Surgeon: Rogene Houston, MD;  [facility];  Service: Endoscopy;  Laterality: N/A;  930  . ESOPHAGEAL DILATION N/A 07/08/2015   Procedure: ESOPHAGEAL DILATION;  Surgeon: Rogene Houston, MD;  [facility];  Service: Endoscopy;  Laterality: N/A;  . ESOPHAGOGASTRODUODENOSCOPY N/A 07/08/2015   Procedure: ESOPHAGOGASTRODUODENOSCOPY (EGD);  Surgeon: Rogene Houston, MD;  [facility];  Service: Endoscopy;  Laterality: N/A;  . Hernia     Right inguinal  . HERNIA REPAIR  1961   right inguinal hernia  . UPPER GASTROINTESTINAL ENDOSCOPY      There were no vitals filed for this visit.  Subjective Assessment - 11/27/18 1117    Subjective  COVID-19 screen performed prior to patient entering clinic. Reports that she has had some tightness in her R low back.    Pertinent History  Scoliosis, hiatial hernia, osteopenia, allergy to tape    Limitations  Sitting;House hold activities;Standing;Walking    How long can you stand comfortably?  20 minutes    How long can you walk comfortably?  20-30 minutes max    Diagnostic tests  x-ray    Patient Stated Goals  improve ability to perform home activities with less pain    Currently in Pain?  Yes    Pain Score  --   No numerical pain rating   Pain Location  Back    Pain Orientation  Right;Lower    Pain Descriptors / Indicators  Tightness    Pain Type  Chronic pain    Pain Onset  More than a month ago         [facility] PT Assessment - 11/27/18 0001      Assessment   Medical Diagnosis  spondylosis without myelopathy or radiculopathy    Referring Provider (PT)  Beau Fanny, PA-C    Prior Therapy  yes      Precautions   Precautions  None      Restrictions   Weight Bearing Restrictions  No                   [facility] Adult PT Treatment/Exercise - 11/27/18 0001      Modalities   Modalities  Electrical  Stimulation;Moist Heat;Traction      Moist Heat Therapy   Number Minutes Moist Heat  10 Minutes    Moist Heat Location  Lumbar Spine      Electrical Stimulation   Electrical Stimulation Location  right LB    Electrical Stimulation Action  Pre-Mod     Electrical Stimulation Parameters  80-150 hz x10 min    Electrical Stimulation Goals  Pain      Traction   Type of Traction  Lumbar    Min (lbs)  5    Max (lbs)  75    Hold Time  99    Rest Time  5    Time  15      Manual Therapy   Manual Therapy  Soft tissue mobilization    Soft tissue mobilization  STW/M right QL technique in prone to reduce tightness                  PT Long Term Goals - 10/30/18 1121      PT LONG TERM GOAL #1   Title  Patient will be independent with HEP    Time  6    Period  Weeks    Status  Achieved      PT LONG TERM GOAL #2   Title  Patient will report ability to perfrom ADLs with low back pain less than or equal to 4/10    Time  6    Period  Weeks    Status  On-going   slow to perform     PT LONG TERM GOAL #3   Title  Patient will demonstrate 4/5 or greater bilateral LE MMT to improve stability during functional tasks.    Time  6    Period  Weeks    Status  On-going      PT LONG TERM GOAL #4   Title  Patient will report ability to stand for 20 minutes or greater with low back pain less than or equal to 5/10 to improve ability to perform home tasks.     Time  6    Period  Weeks    Status  Partially Met   varies on the day but overall can complete           Plan - 11/27/18 1217    Clinical Impression Statement  Patient presented in clinic with reports of increased tightness of the R QL. No numerical pain rating provided today by patient. Patient able to tolerate more pressure during STW due to pain relief  during the session. Normal modalities response noted following removal of the modalities.    Stability/Clinical Decision Making  Evolving/Moderate complexity    Rehab  Potential  Good    PT Frequency  2x / week    PT Duration  6 weeks    PT Treatment/Interventions  Electrical Stimulation;ADLs/Self Care Home Management;Cryotherapy;Traction;Ultrasound;Moist Heat;Iontophoresis 12m/ml Dexamethasone;Gait training;Stair training;Manual techniques;Dry needling;Passive range of motion;Balance training;Neuromuscular re-education;Therapeutic exercise;Therapeutic activities;Patient/family education    PT Next Visit Plan  maintain traction at 75# cont STW/M to right low back    PT Home Exercise Plan  see patient education section    Consulted and Agree with Plan of Care  Patient       Patient will benefit from skilled therapeutic intervention in order to improve the following deficits and impairments:  Abnormal gait, Pain, Postural dysfunction, Decreased activity tolerance, Decreased range of motion, Decreased strength  Visit Diagnosis: 1. Chronic bilateral low back pain without sciatica   2. Abnormal posture        Problem List Patient Active Problem List   Diagnosis Date Noted  . SVT (supraventricular tachycardia) ([place] 08/28/2018  . Elevated troponin 08/28/2018  . Light headedness 05/09/2018  . Osteopenia of the elderly 02/19/2014  . DDD (degenerative disc disease), lumbar 12/03/2013  . Scoliosis 12/03/2013  . Chronic LBP 05/14/2013  . IBS (irritable bowel syndrome) 11/08/2012  . UC ([place]cerative colitis) ([place] 04/23/2012  . Palpitation 10/26/2011  . Hypertension 10/26/2011  . Chronic ulcerative colitis ([place] 03/23/2011   KStandley Brooking PTA 11/27/2018, 3:47 PM  [facility] [address] [address]Phone: [phone]  Fax:  [phone] Name: BFranceen ErismanMRN: [id number] of Birth: 8January 19, 1937

## 2018-11-27 NOTE — Telephone Encounter (Signed)
Per Dr. Laural Golden the patient may take Phyzyme 180 mg  Twice daily as needed for gas and bloating. Patient appointment may be moved up from 02/28/2019. Patient called and made aware.

## 2018-11-27 NOTE — Telephone Encounter (Signed)
Back in March the patient had a couple of spells with her heart. She was told that the vein that goes up and down, at times it goes in circles. She was also told that her Potassium was elevated. Given a medication for her heart and all of this is going well. Potassium is now back down from a high level. Trying to avoid foods that are high in potassium , and has checked her medications to see if any of t hose may have additional potassium.  She is asking about foods that do not have potassium. She also states that the generic Asacol is not working as good as the brand. She has ben on this for 6 months. Feels bloated and has a lot of gas.  Appointment not until October 29 , 2020 and this is currently with Terri. Patient is asking if she should come in sooner to get  Everything addressed.  To address with Dr.Rehman.

## 2018-12-04 ENCOUNTER — Encounter (INDEPENDENT_AMBULATORY_CARE_PROVIDER_SITE_OTHER): Payer: Self-pay | Admitting: Internal Medicine

## 2018-12-04 ENCOUNTER — Ambulatory Visit (INDEPENDENT_AMBULATORY_CARE_PROVIDER_SITE_OTHER): Payer: Medicare Other | Admitting: Internal Medicine

## 2018-12-04 ENCOUNTER — Other Ambulatory Visit: Payer: Self-pay

## 2018-12-04 ENCOUNTER — Ambulatory Visit: Payer: Medicare Other | Attending: Orthopaedic Surgery | Admitting: *Deleted

## 2018-12-04 VITALS — BP 133/81 | HR 56 | Temp 97.7°F | Resp 18 | Ht 62.0 in | Wt 131.0 lb

## 2018-12-04 DIAGNOSIS — M6281 Muscle weakness (generalized): Secondary | ICD-10-CM | POA: Diagnosis not present

## 2018-12-04 DIAGNOSIS — K51018 Ulcerative (chronic) pancolitis with other complication: Secondary | ICD-10-CM | POA: Diagnosis not present

## 2018-12-04 DIAGNOSIS — M545 Low back pain: Secondary | ICD-10-CM | POA: Diagnosis not present

## 2018-12-04 DIAGNOSIS — G8929 Other chronic pain: Secondary | ICD-10-CM | POA: Insufficient documentation

## 2018-12-04 DIAGNOSIS — M546 Pain in thoracic spine: Secondary | ICD-10-CM | POA: Diagnosis not present

## 2018-12-04 DIAGNOSIS — R293 Abnormal posture: Secondary | ICD-10-CM | POA: Insufficient documentation

## 2018-12-04 MED ORDER — LOPERAMIDE HCL 2 MG PO CAPS: 2.0000 mg | ORAL_CAPSULE | ORAL | Status: DC

## 2018-12-04 NOTE — Patient Instructions (Signed)
Imodium OTC 2 mg by mouth every morning.  Can reduce dose to 1 mg if 2 mg makes her constipated. Physician will call the results of stool test.

## 2018-12-04 NOTE — Progress Notes (Signed)
Presenting complaint;  Follow-up for ulcerative colitis. Having issues since formulation changed to generic.  Database and subjective:  Patient is 83 year old Caucasian female who has a history of pan ulcerative colitis and has been in remission for a number of years.  Last colonoscopy was in March 2017.  She was doing well when she was last seen in October 2019.  She was scheduled to come back in October 2020 when she requested office visit earlier because of her symptoms. She states she switched to generic mesalamine in January and she has noted loose stools urgency bloating and has had few accidents when she has not been able to make it to the bathroom.  However lately she is doing better.  She also passes small amount of stool every time she urinates.  She has not had any cramping melena or rectal bleeding.  Her appetite is normal.  She has lost 5 pounds since her last visit. She was also noted to have mildly elevated serum potassium and serum calcium.  Follow-up studies by Dr. Lajuana Ripple revealed these to be normal.  Patient is concerned of generic mesalamine caused this.  I told her I do not believe so as electrolyte abnormalities corrected spontaneously while she continue the medication.  Current Medications: Outpatient Encounter Medications as of 12/04/2018  Medication Sig  . acetaminophen (TYLENOL 8 HOUR ARTHRITIS PAIN) 650 MG CR tablet Take 650 mg by mouth every 8 (eight) hours as needed. Patient states that she takes prn for arthritis in her knees.   . Bromelains 500 MG TABS Take 500 mg by mouth daily.  [name] 500 MG CAPS Take 1 capsule by mouth daily.   . Coenzyme Q10 (CO Q-10 PO) Take 1 tablet by mouth daily. With red yeast rice [employer]  . COLLAGEN PO Take 1 tablet by mouth daily. This is known as 1-2-3  . Cyanocobalamin (VITAMIN B 12 PO) Take 1,000 mcg by mouth daily.   . Ginkgo Biloba 40 MG TABS Take 40 mg by mouth daily.   [name] glucosamine-chondroitin 500-400 MG tablet  Take 1 tablet by mouth 2 (two) times daily.    [name] Hyaluronic Acid-Vitamin C (HYALURONIC ACID PO) Take 80 mg by mouth daily.  . Mesalamine (ASACOL) 400 MG CPDR DR capsule Take 2 capsules (800 mg total) by mouth daily.  . metoprolol tartrate (LOPRESSOR) 25 MG tablet Take 1 tablet (25 mg total) by mouth 2 (two) times daily. (Patient taking differently: Take 25 mg by mouth 2 (two) times daily. Take one table by mouth in the morning and 1/2 tablet in the evening)  . MULTIPLE VITAMINS PO Take 1 tablet by mouth daily. In the mornings  . Nutritional Supplements (GRAPESEED EXTRACT PO) Take 60 mg by mouth daily.   [name] OAT BRAN SOLUBLE PO Take 1 tablet by mouth daily.  [name] OVER THE COUNTER MEDICATION Take 66 mg by mouth daily. Vision Gold Lutein  . OVER THE COUNTER MEDICATION Take 250 mg by mouth daily. Alphalipoic Acid 250 mg daily  . pantoprazole (PROTONIX) 40 MG tablet TAKE 1 TABLET DAILY BEFORE BREAKFAST (Patient taking differently: Take 40 mg by mouth every morning. )  . pyridOXINE (VITAMIN B-6) 100 MG tablet Take 100 mg by mouth daily.   . Turmeric 500 MG CAPS Take 1 capsule by mouth 3 (three) times daily.   . Vitamins C E (CRANBERRY CONCENTRATE PO) Take 2,500 mg by mouth daily.   . Zinc 50 MG CAPS Take 50 mg by mouth daily.   [name]  Calcium Carbonate-Vitamin D (CALCIUM 600+D) 600-400 MG-UNIT per tablet Take 1 tablet by mouth every evening. (Patient not taking: Reported on 11/14/2018)  . [DISCONTINUED] MILK THISTLE PO Take 100 mg by mouth daily.   No facility-administered encounter medications on file as of 12/04/2018.      Objective: Blood pressure 133/81, pulse (!) 56, temperature 97.7 F (36.5 C), temperature source Oral, resp. rate 18, height _0  (1.575 m), weight 131 lb (59.4 kg). Patient is alert and in no acute distress. Conjunctiva is pink. Sclera is nonicteric Oropharyngeal mucosa is normal. No neck masses or thyromegaly noted. Cardiac exam with regular rhythm normal S1 and S2. No murmur or  gallop noted. Lungs are clear to auscultation. Abdomen is symmetrical.  Bowel sounds are normal.  On palpation abdomen is soft and nontender with organomegaly or masses. No LE edema or clubbing noted.  Labs/studies Results:  CBC Latest Ref Rng & Units 10/23/2018 08/26/2018 08/24/2018  WBC 3.4 - 10.8 x10E3/uL 5.8 8.4 5.6  Hemoglobin 11.1 - 15.9 g/dL 13.7 15.0 13.4  Hematocrit 34.0 - 46.6 % 40.1 45.7 40.8  Platelets 150 - 450 x10E3/uL 256 184 240    CMP Latest Ref Rng & Units 11/23/2018 11/14/2018 10/23/2018  Glucose 65 - 99 mg/dL 80 80 90  BUN 8 - 27 mg/dL _1 Creatinine 0.57 - 1.00 mg/dL 0.86 0.83 0.74  Sodium 134 - 144 mmol/L 141 143 143  Potassium 3.5 - 5.2 mmol/L 4.9 5.6(H) 5.4(H)  Chloride 96 - 106 mmol/L 101 100 103  CO2 20 - 29 mmol/L _2 Calcium 8.7 - 10.3 mg/dL 9.8 10.2 10.4(H)  Total Protein 6.0 - 8.5 g/dL - - 7.0  Total Bilirubin 0.0 - 1.2 mg/dL - - 0.5  Alkaline Phos 39 - 117 IU/L - - 37(L)  AST 0 - 40 IU/L - - 19  ALT 0 - 32 IU/L - - 21    Hepatic Function Latest Ref Rng & Units 10/23/2018 08/26/2018 08/24/2018  Total Protein 6.0 - 8.5 g/dL 7.0 7.8 6.8  Albumin 3.7 - 4.7 g/dL 4.5 4.7 4.0  AST 0 - 40 IU/L _3 ALT 0 - 32 IU/L _4 Alk Phosphatase 39 - 117 IU/L 37(L) 36(L) 31(L)  Total Bilirubin 0.0 - 1.2 mg/dL 0.5 0.7 0.6  Bilirubin, Direct 0.00 - 0.40 mg/dL 0.14 - -     Assessment:  #1.  Chronic ulcerative colitis.  Colonoscopy in March 2017 revealed her to be in endoscopic remission.  She has noted change in her bowel habits since he switched from is a call to generic equivalent.  Slowly things are returning back to normal.  She has not had rectal bleeding.  Therefore I doubt that she has developed relapse of UC.  Recent H&H was normal which is reassuring.  It is possible that she has IBS.  Since there is concern for relapse we will proceed with fecal calprotectin.  Plan:  We will check fecal calprotectin. Will leave her on mesalamine at a dose  of 800 mg twice daily. Imodium OTC 2 mg by mouth every morning.  She can reduce dose to 1 mg if 2 mg is too much. Office visit in 6 months.

## 2018-12-04 NOTE — Therapy (Signed)
[facility] [place], [place], [address] Phone: [phone]   Fax:  [phone]  Physical Therapy Treatment  Patient Details  Name: Lori Soto MRN: [id number] Date of Birth: 12-01-1935 Referring Provider (PT): Beau Fanny, [place]   Encounter Date: 12/04/2018  PT End of Session - 12/04/18 1447    Visit Number  16    Number of Visits  18    Date for PT Re-Evaluation  12/28/18    PT Start Time  0230    PT Stop Time  0320    PT Time Calculation (min)  50 min       Past Medical History:  Diagnosis Date  . Anemia   . Arthritis   . Cancer ([place])    skin cancer on nose  . Carpal tunnel syndrome, bilateral   . Cataract   . Esophagitis   . GERD (gastroesophageal reflux disease)   . H/O hiatal hernia   . Osteopenia   . Scoliosis   . Shingles   . SVT (supraventricular tachycardia) ([place])   . Ulcerative colitis     Past Surgical History:  Procedure Laterality Date  . ABDOMINAL HYSTERECTOMY    . BUNIONECTOMY     Bilateral  . CARPAL TUNNEL RELEASE Left 03/10/2015   Procedure: LEFT CARPAL TUNNEL RELEASE;  Surgeon: Daryll Brod, MD;  Location: [facility];  Service: Orthopedics;  Laterality: Left;  . CARPAL TUNNEL RELEASE Right 10/31/2017   Procedure: RIGHT CARPAL TUNNEL RELEASE;  Surgeon: Daryll Brod, MD;  Location: [facility];  Service: Orthopedics;  Laterality: Right;  . CATARACT EXTRACTION W/PHACO  05/28/2012   Procedure: CATARACT EXTRACTION PHACO AND INTRAOCULAR LENS PLACEMENT (IOC);  Surgeon: Tonny Branch, MD;  [facility]: [facility];  Service: Ophthalmology;  Laterality: Right;  CDE=12.84  . CATARACT EXTRACTION W/PHACO  06/07/2012   Procedure: CATARACT EXTRACTION PHACO AND INTRAOCULAR LENS PLACEMENT (IOC);  Surgeon: Tonny Branch, MD;  Location: [facility];  Service: Ophthalmology;  Laterality: Left;  CDE 17.60  . COLONOSCOPY    . COLONOSCOPY N/A 07/08/2015   Procedure: COLONOSCOPY;  Surgeon: Rogene Houston, MD;  [facility];  Service: Endoscopy;  Laterality: N/A;  930  . ESOPHAGEAL DILATION N/A 07/08/2015   Procedure: ESOPHAGEAL DILATION;  Surgeon: Rogene Houston, MD;  [facility];  Service: Endoscopy;  Laterality: N/A;  . ESOPHAGOGASTRODUODENOSCOPY N/A 07/08/2015   Procedure: ESOPHAGOGASTRODUODENOSCOPY (EGD);  Surgeon: Rogene Houston, MD;  [facility];  Service: Endoscopy;  Laterality: N/A;  . Hernia     Right inguinal  . HERNIA REPAIR  1961   right inguinal hernia  . UPPER GASTROINTESTINAL ENDOSCOPY      There were no vitals filed for this visit.  Subjective Assessment - 12/04/18 1435    Subjective  COVID-19 screen performed prior to patient entering clinic. Lt side is hurting after doing some walking    Pertinent History  Scoliosis, hiatial hernia, osteopenia, allergy to tape    Limitations  Sitting;House hold activities;Standing;Walking    How long can you stand comfortably?  20 minutes    How long can you walk comfortably?  20-30 minutes max    Patient Stated Goals  improve ability to perform home activities with less pain    Pain Score  8     Pain Location  Back    Pain Orientation  Left;Lower    Pain Descriptors / Indicators  Tightness    Pain Type  Chronic  pain                       [facility] Adult PT Treatment/Exercise - 12/04/18 0001      Modalities   Modalities  Electrical Stimulation;Moist Heat;Traction      Moist Heat Therapy   Number Minutes Moist Heat  12 Minutes    Moist Heat Location  Lumbar Spine      Electrical Stimulation   Electrical Stimulation Location  right LB    Electrical Stimulation Action  premod    Electrical Stimulation Parameters  80-_0  x 12 mins    Electrical Stimulation Goals  Pain      Traction   Type of Traction  Lumbar    Min (lbs)  5    Max (lbs)  75    Hold Time  99    Rest Time  5    Time  15      Manual Therapy   Manual Therapy  Soft tissue mobilization    Soft tissue  mobilization  STW/M right QL technique in prone to reduce tightness                  PT Long Term Goals - 10/30/18 1121      PT LONG TERM GOAL #1   Title  Patient will be independent with HEP    Time  6    Period  Weeks    Status  Achieved      PT LONG TERM GOAL #2   Title  Patient will report ability to perfrom ADLs with low back pain less than or equal to 4/10    Time  6    Period  Weeks    Status  On-going   slow to perform     PT LONG TERM GOAL #3   Title  Patient will demonstrate 4/5 or greater bilateral LE MMT to improve stability during functional tasks.    Time  6    Period  Weeks    Status  On-going      PT LONG TERM GOAL #4   Title  Patient will report ability to stand for 20 minutes or greater with low back pain less than or equal to 5/10 to improve ability to perform home tasks.     Time  6    Period  Weeks    Status  Partially Met   varies on the day but overall can complete           Plan - 12/04/18 1457    Clinical Impression Statement  Pt arrived today with 8/10 LBP after walking a lot at the store.She did well with STW, but had notable tightness in LT and RT LB paras  as well as L side QL. She was able to tolerate 75#s of traction again today and did well with decreased pain.    Stability/Clinical Decision Making  Evolving/Moderate complexity    Rehab Potential  Good    PT Frequency  2x / week    PT Duration  6 weeks    PT Treatment/Interventions  Electrical Stimulation;ADLs/Self Care Home Management;Cryotherapy;Traction;Ultrasound;Moist Heat;Iontophoresis 90m/ml Dexamethasone;Gait training;Stair training;Manual techniques;Dry needling;Passive range of motion;Balance training;Neuromuscular re-education;Therapeutic exercise;Therapeutic activities;Patient/family education    PT Next Visit Plan  maintain traction at 75# cont STW/M to right low back    PT Home Exercise Plan  see patient education section       Patient will benefit from  skilled therapeutic intervention in order  to improve the following deficits and impairments:  Abnormal gait, Pain, Postural dysfunction, Decreased activity tolerance, Decreased range of motion, Decreased strength  Visit Diagnosis: 1. Chronic bilateral low back pain without sciatica   2. Abnormal posture   3. Muscle weakness (generalized)   4. Pain in thoracic spine        Problem List Patient Active Problem List   Diagnosis Date Noted  . SVT (supraventricular tachycardia) ([place]) 08/28/2018  . Elevated troponin 08/28/2018  . Light headedness 05/09/2018  . Osteopenia of the elderly 02/19/2014  . DDD (degenerative disc disease), lumbar 12/03/2013  . Scoliosis 12/03/2013  . Chronic LBP 05/14/2013  . IBS (irritable bowel syndrome) 11/08/2012  . UC (ulcerative colitis) ([employer]) 04/23/2012  . Palpitation 10/26/2011  . Hypertension 10/26/2011  . Chronic ulcerative colitis ([place]) 03/23/2011    RAMSEUR,CHRIS, PTA 12/04/2018, 5:11 PM  [facility] [address] [place], [place], [address] Phone: [phone]   Fax:  [phone]  Name: Lori Soto MRN: [id number] Date of Birth: 06-23-1935

## 2018-12-11 ENCOUNTER — Ambulatory Visit: Payer: Medicare Other | Admitting: Physical Therapy

## 2018-12-11 ENCOUNTER — Other Ambulatory Visit: Payer: Self-pay

## 2018-12-11 DIAGNOSIS — M546 Pain in thoracic spine: Secondary | ICD-10-CM | POA: Diagnosis not present

## 2018-12-11 DIAGNOSIS — R293 Abnormal posture: Secondary | ICD-10-CM | POA: Diagnosis not present

## 2018-12-11 DIAGNOSIS — G8929 Other chronic pain: Secondary | ICD-10-CM

## 2018-12-11 DIAGNOSIS — M6281 Muscle weakness (generalized): Secondary | ICD-10-CM | POA: Diagnosis not present

## 2018-12-11 DIAGNOSIS — M545 Low back pain, unspecified: Secondary | ICD-10-CM

## 2018-12-11 NOTE — Therapy (Signed)
[facility] [place], [place], [address] Phone: [phone]   Fax:  [phone]  Physical Therapy Treatment  Patient Details  Name: Lori Soto MRN: [id number] Date of Birth: 1936/01/20 Referring Provider (PT): Beau Fanny, [place]   Encounter Date: 12/11/2018  PT End of Session - 12/11/18 1212    Visit Number  18    Number of Visits  24    Date for PT Re-Evaluation  01/18/19    PT Start Time  1127    PT Stop Time  1218    PT Time Calculation (min)  51 min       Past Medical History:  Diagnosis Date  . Anemia   . Arthritis   . Cancer ([place])    skin cancer on nose  . Carpal tunnel syndrome, bilateral   . Cataract   . Esophagitis   . GERD (gastroesophageal reflux disease)   . H/O hiatal hernia   . Osteopenia   . Scoliosis   . Shingles   . SVT (supraventricular tachycardia) ([place])   . Ulcerative colitis     Past Surgical History:  Procedure Laterality Date  . ABDOMINAL HYSTERECTOMY    . BUNIONECTOMY     Bilateral  . CARPAL TUNNEL RELEASE Left 03/10/2015   Procedure: LEFT CARPAL TUNNEL RELEASE;  Surgeon: Daryll Brod, MD;  Location: [facility];  Service: Orthopedics;  Laterality: Left;  . CARPAL TUNNEL RELEASE Right 10/31/2017   Procedure: RIGHT CARPAL TUNNEL RELEASE;  Surgeon: Daryll Brod, MD;  Location: [facility];  Service: Orthopedics;  Laterality: Right;  . CATARACT EXTRACTION W/PHACO  05/28/2012   Procedure: CATARACT EXTRACTION PHACO AND INTRAOCULAR LENS PLACEMENT (IOC);  Surgeon: Tonny Branch, MD;  [facility];  Service: Ophthalmology;  Laterality: Right;  CDE=12.84  . CATARACT EXTRACTION W/PHACO  06/07/2012   Procedure: CATARACT EXTRACTION PHACO AND INTRAOCULAR LENS PLACEMENT (IOC);  Surgeon: Tonny Branch, MD;  Location: [facility];  Service: Ophthalmology;  Laterality: Left;  CDE 17.60  . COLONOSCOPY    . COLONOSCOPY N/A 07/08/2015   Procedure: COLONOSCOPY;  Surgeon: Rogene Houston, MD;  [facility];  Service: Endoscopy;  Laterality: N/A;  930  . ESOPHAGEAL DILATION N/A 07/08/2015   Procedure: ESOPHAGEAL DILATION;  Surgeon: Rogene Houston, MD;  [facility];  Service: Endoscopy;  Laterality: N/A;  . ESOPHAGOGASTRODUODENOSCOPY N/A 07/08/2015   Procedure: ESOPHAGOGASTRODUODENOSCOPY (EGD);  Surgeon: Rogene Houston, MD;  [facility];  Service: Endoscopy;  Laterality: N/A;  . Hernia     Right inguinal  . HERNIA REPAIR  1961   right inguinal hernia  . UPPER GASTROINTESTINAL ENDOSCOPY      There were no vitals filed for this visit.  Subjective Assessment - 12/11/18 1214    Subjective  COVID-19 screen performed prior to patient entering clinic.  Had graduation party this last weekend.  I made homemade ice cream and I lift alot.  Hurts across low back, especially on right.    Pertinent History  Scoliosis, hiatial hernia, osteopenia, allergy to tape    Limitations  Sitting;House hold activities;Standing;Walking    How long can you stand comfortably?  20 minutes    How long can you walk comfortably?  20-30 minutes max    Diagnostic tests  x-ray    Patient Stated Goals  improve ability to perform home activities with less pain    Currently in Pain?  Yes    Pain Score  9     Pain Location  Back    Pain Orientation  Right;Left;Lower    Pain Descriptors / Indicators  Tightness    Pain Type  Chronic pain    Pain Onset  More than a month ago                       [facility] Adult PT Treatment/Exercise - 12/11/18 0001      Modalities   Modalities  Electrical Stimulation;Moist Heat      Moist Heat Therapy   Number Minutes Moist Heat  10 Minutes    Moist Heat Location  Lumbar Spine      Electrical Stimulation   Electrical Stimulation Location  Right LB    Electrical Stimulation Action  Pre-mod.    Electrical Stimulation Parameters  80-150 Hz x 10 minutes.    Electrical Stimulation Goals  Pain      Traction   Type of  Traction  Lumbar    Min (lbs)  5    Max (lbs)  75    Hold Time  99    Rest Time  5    Time  15      Manual Therapy   Manual Therapy  Soft tissue mobilization    Soft tissue mobilization  STW/M x 8 minutes to right low back/QL release technique.                  PT Long Term Goals - 10/30/18 1121      PT LONG TERM GOAL #1   Title  Patient will be independent with HEP    Time  6    Period  Weeks    Status  Achieved      PT LONG TERM GOAL #2   Title  Patient will report ability to perfrom ADLs with low back pain less than or equal to 4/10    Time  6    Period  Weeks    Status  On-going   slow to perform     PT LONG TERM GOAL #3   Title  Patient will demonstrate 4/5 or greater bilateral LE MMT to improve stability during functional tasks.    Time  6    Period  Weeks    Status  On-going      PT LONG TERM GOAL #4   Title  Patient will report ability to stand for 20 minutes or greater with low back pain less than or equal to 5/10 to improve ability to perform home tasks.     Time  6    Period  Weeks    Status  Partially Met   varies on the day but overall can complete           Plan - 12/11/18 1219    Clinical Impression Statement  Patient with high pain-level after a very strenuous weekend.  She did well with treatment today.  She c/o pain all along her right lumbar region.    Stability/Clinical Decision Making  Evolving/Moderate complexity    PT Treatment/Interventions  Electrical Stimulation;ADLs/Self Care Home Management;Cryotherapy;Traction;Ultrasound;Moist Heat;Iontophoresis 84m/ml Dexamethasone;Gait training;Stair training;Manual techniques;Dry needling;Passive range of motion;Balance training;Neuromuscular re-education;Therapeutic exercise;Therapeutic activities;Patient/family education    PT Next Visit Plan  maintain traction at 75# cont STW/M to right low back    PT Home Exercise Plan  see patient education section    Consulted and Agree with Plan  of Care  Patient  Patient will benefit from skilled therapeutic intervention in order to improve the following deficits and impairments:  Abnormal gait, Pain, Postural dysfunction, Decreased activity tolerance, Decreased range of motion, Decreased strength  Visit Diagnosis: 1. Chronic bilateral low back pain without sciatica   2. Abnormal posture        Problem List Patient Active Problem List   Diagnosis Date Noted  . SVT (supraventricular tachycardia) ([place]) 08/28/2018  . Elevated troponin 08/28/2018  . Light headedness 05/09/2018  . Osteopenia of the elderly 02/19/2014  . DDD (degenerative disc disease), lumbar 12/03/2013  . Scoliosis 12/03/2013  . Chronic LBP 05/14/2013  . IBS (irritable bowel syndrome) 11/08/2012  . UC (ulcerative colitis) ([place]) 04/23/2012  . Palpitation 10/26/2011  . Hypertension 10/26/2011  . Chronic ulcerative colitis ([place]) 03/23/2011    Brieann Osinski, [place] MPT 12/11/2018, 12:25 PM  [facility] [address] [place], [place], [address] Phone: [phone]   Fax:  [phone]  Name: Madolin Twaddle MRN: [id number] Date of Birth: 05-Apr-1936

## 2018-12-18 ENCOUNTER — Other Ambulatory Visit: Payer: Self-pay

## 2018-12-18 ENCOUNTER — Ambulatory Visit: Payer: Medicare Other | Admitting: Physical Therapy

## 2018-12-18 ENCOUNTER — Encounter: Payer: Self-pay | Admitting: Physical Therapy

## 2018-12-18 DIAGNOSIS — R293 Abnormal posture: Secondary | ICD-10-CM

## 2018-12-18 DIAGNOSIS — M545 Low back pain: Secondary | ICD-10-CM | POA: Diagnosis not present

## 2018-12-18 DIAGNOSIS — M546 Pain in thoracic spine: Secondary | ICD-10-CM | POA: Diagnosis not present

## 2018-12-18 DIAGNOSIS — M6281 Muscle weakness (generalized): Secondary | ICD-10-CM | POA: Diagnosis not present

## 2018-12-18 DIAGNOSIS — G8929 Other chronic pain: Secondary | ICD-10-CM

## 2018-12-18 NOTE — Therapy (Signed)
[facility] [place], [place], [address] Phone: [phone]   Fax:  [phone]  Physical Therapy Treatment  Patient Details  Name: Lori Soto MRN: [id number] Date of Birth: 04-18-1936 Referring Provider (PT): Beau Fanny, [place]   Encounter Date: 12/18/2018  PT End of Session - 12/18/18 1133    Visit Number  19    Number of Visits  24    Date for PT Re-Evaluation  01/18/19    PT Start Time  1120    PT Stop Time  1214    PT Time Calculation (min)  54 min    Activity Tolerance  Patient tolerated treatment well    Behavior During Therapy  [facility] for tasks assessed/performed       Past Medical History:  Diagnosis Date  . Anemia   . Arthritis   . Cancer ([place])    skin cancer on nose  . Carpal tunnel syndrome, bilateral   . Cataract   . Esophagitis   . GERD (gastroesophageal reflux disease)   . H/O hiatal hernia   . Osteopenia   . Scoliosis   . Shingles   . SVT (supraventricular tachycardia) ([place])   . Ulcerative colitis     Past Surgical History:  Procedure Laterality Date  . ABDOMINAL HYSTERECTOMY    . BUNIONECTOMY     Bilateral  . CARPAL TUNNEL RELEASE Left 03/10/2015   Procedure: LEFT CARPAL TUNNEL RELEASE;  Surgeon: Daryll Brod, MD;  Location: [place];  Service: Orthopedics;  Laterality: Left;  . CARPAL TUNNEL RELEASE Right 10/31/2017   Procedure: RIGHT CARPAL TUNNEL RELEASE;  Surgeon: Daryll Brod, MD;  Location: [place];  Service: Orthopedics;  Laterality: Right;  . CATARACT EXTRACTION W/PHACO  05/28/2012   Procedure: CATARACT EXTRACTION PHACO AND INTRAOCULAR LENS PLACEMENT (IOC);  Surgeon: Tonny Branch, MD;  [facility];  Service: Ophthalmology;  Laterality: Right;  CDE=12.84  . CATARACT EXTRACTION W/PHACO  06/07/2012   Procedure: CATARACT EXTRACTION PHACO AND INTRAOCULAR LENS PLACEMENT (IOC);  Surgeon: Tonny Branch, MD;  [facility];  Service: Ophthalmology;   Laterality: Left;  CDE 17.60  . COLONOSCOPY    . COLONOSCOPY N/A 07/08/2015   Procedure: COLONOSCOPY;  Surgeon: Rogene Houston, MD;  [facility];  Service: Endoscopy;  Laterality: N/A;  930  . ESOPHAGEAL DILATION N/A 07/08/2015   Procedure: ESOPHAGEAL DILATION;  Surgeon: Rogene Houston, MD;  [facility];  Service: Endoscopy;  Laterality: N/A;  . ESOPHAGOGASTRODUODENOSCOPY N/A 07/08/2015   Procedure: ESOPHAGOGASTRODUODENOSCOPY (EGD);  Surgeon: Rogene Houston, MD;  [facility];  Service: Endoscopy;  Laterality: N/A;  . Hernia     Right inguinal  . HERNIA REPAIR  1961   right inguinal hernia  . UPPER GASTROINTESTINAL ENDOSCOPY      There were no vitals filed for this visit.  Subjective Assessment - 12/18/18 1132    Subjective  COVID 19 screening performed on patient upon arrival. Patient reports that she has had a busy few days and had a lot of pain yesterday. Reports sitting frequently yesterday due to pain.    Pertinent History  Scoliosis, hiatial hernia, osteopenia, allergy to tape    Limitations  Sitting;House hold activities;Standing;Walking    How long can you stand comfortably?  20 minutes    How long can you walk comfortably?  20-30 minutes max    Diagnostic tests  x-ray    Patient Stated Goals  improve ability to  perform home activities with less pain    Currently in Pain?  Yes    Pain Location  Back    Pain Orientation  Right;Lower    Pain Descriptors / Indicators  Discomfort;Tightness    Pain Type  Chronic pain    Pain Onset  More than a month ago    Pain Frequency  Intermittent                       [facility] Adult PT Treatment/Exercise - 12/18/18 0001      Modalities   Modalities  Electrical Stimulation;Moist Heat;Traction      Moist Heat Therapy   Number Minutes Moist Heat  15 Minutes    Moist Heat Location  Lumbar Spine      Electrical Stimulation   Electrical Stimulation Location  Right LB    Electrical Stimulation  Action  Pre-mod    Electrical Stimulation Parameters  80-150 hz x15 min    Electrical Stimulation Goals  Pain      Traction   Type of Traction  Lumbar    Min (lbs)  5    Max (lbs)  75    Hold Time  99    Rest Time  5    Time  15      Manual Therapy   Manual Therapy  Soft tissue mobilization    Soft tissue mobilization  STW to R QL, lumbar paraspinals to reduce muscle tightness that limits function                   PT Long Term Goals - 10/30/18 1121      PT LONG TERM GOAL #1   Title  Patient will be independent with HEP    Time  6    Period  Weeks    Status  Achieved      PT LONG TERM GOAL #2   Title  Patient will report ability to perfrom ADLs with low back pain less than or equal to 4/10    Time  6    Period  Weeks    Status  On-going   slow to perform     PT LONG TERM GOAL #3   Title  Patient will demonstrate 4/5 or greater bilateral LE MMT to improve stability during functional tasks.    Time  6    Period  Weeks    Status  On-going      PT LONG TERM GOAL #4   Title  Patient will report ability to stand for 20 minutes or greater with low back pain less than or equal to 5/10 to improve ability to perform home tasks.     Time  6    Period  Weeks    Status  Partially Met   varies on the day but overall can complete           Plan - 12/18/18 1209    Clinical Impression Statement  Patient presented in clinic with less R LBP  then yesterday (10/10). Normal modalities response noted following removal of the modalities. Mechanical traction maintained at 75# max. Patient reported soreness upon palpation of R QL and SI joint today.    Stability/Clinical Decision Making  Evolving/Moderate complexity    Rehab Potential  Good    PT Frequency  2x / week    PT Duration  6 weeks    PT Treatment/Interventions  Electrical Stimulation;ADLs/Self Care Home Management;Cryotherapy;Traction;Ultrasound;Moist Heat;Iontophoresis 71m/ml Dexamethasone;Gait training;Stair  training;Manual techniques;Dry needling;Passive range of motion;Balance training;Neuromuscular re-education;Therapeutic exercise;Therapeutic activities;Patient/family education    PT Next Visit Plan  maintain traction at 75# cont STW/M to right low back    PT Home Exercise Plan  see patient education section    Consulted and Agree with Plan of Care  Patient       Patient will benefit from skilled therapeutic intervention in order to improve the following deficits and impairments:  Abnormal gait, Pain, Postural dysfunction, Decreased activity tolerance, Decreased range of motion, Decreased strength  Visit Diagnosis: 1. Chronic bilateral low back pain without sciatica   2. Abnormal posture        Problem List Patient Active Problem List   Diagnosis Date Noted  . SVT (supraventricular tachycardia) ([place]) 08/28/2018  . Elevated troponin 08/28/2018  . Light headedness 05/09/2018  . Osteopenia of the elderly 02/19/2014  . DDD (degenerative disc disease), lumbar 12/03/2013  . Scoliosis 12/03/2013  . Chronic LBP 05/14/2013  . IBS (irritable bowel syndrome) 11/08/2012  . UC (ulcerative colitis) ([place]) 04/23/2012  . Palpitation 10/26/2011  . Hypertension 10/26/2011  . Chronic ulcerative colitis ([place]) 03/23/2011    Standley Brooking, PTA 12/18/2018, 12:19 PM  [facility] [address] [place], [place], [address] Phone: [phone]   Fax:  [phone]  Name: Lori Soto MRN: [id number] Date of Birth: 08/05/1935

## 2018-12-25 ENCOUNTER — Other Ambulatory Visit: Payer: Self-pay

## 2018-12-25 ENCOUNTER — Ambulatory Visit: Payer: Medicare Other | Admitting: Physical Therapy

## 2018-12-25 DIAGNOSIS — G8929 Other chronic pain: Secondary | ICD-10-CM

## 2018-12-25 DIAGNOSIS — M546 Pain in thoracic spine: Secondary | ICD-10-CM | POA: Diagnosis not present

## 2018-12-25 DIAGNOSIS — M6281 Muscle weakness (generalized): Secondary | ICD-10-CM

## 2018-12-25 DIAGNOSIS — R293 Abnormal posture: Secondary | ICD-10-CM | POA: Diagnosis not present

## 2018-12-25 DIAGNOSIS — M545 Low back pain, unspecified: Secondary | ICD-10-CM

## 2018-12-25 NOTE — Therapy (Signed)
[facility] [place], [place], [address] Phone: [phone]   Fax:  [phone]  Physical Therapy Treatment  Patient Details  Name: Courtany Mcmurphy MRN: [id number] Date of Birth: 06/06/1935 Referring Provider (PT): Beau Fanny, [place]   Encounter Date: 12/25/2018  PT End of Session - 12/25/18 4665    Visit Number  20    Number of Visits  24    Date for PT Re-Evaluation  01/18/19    PT Start Time  1118    PT Stop Time  1215    PT Time Calculation (min)  57 min       Past Medical History:  Diagnosis Date  . Anemia   . Arthritis   . Cancer ([place])    skin cancer on nose  . Carpal tunnel syndrome, bilateral   . Cataract   . Esophagitis   . GERD (gastroesophageal reflux disease)   . H/O hiatal hernia   . Osteopenia   . Scoliosis   . Shingles   . SVT (supraventricular tachycardia) ([place])   . Ulcerative colitis     Past Surgical History:  Procedure Laterality Date  . ABDOMINAL HYSTERECTOMY    . BUNIONECTOMY     Bilateral  . CARPAL TUNNEL RELEASE Left 03/10/2015   Procedure: LEFT CARPAL TUNNEL RELEASE;  Surgeon: Daryll Brod, MD;  Location: [place];  Service: Orthopedics;  Laterality: Left;  . CARPAL TUNNEL RELEASE Right 10/31/2017   Procedure: RIGHT CARPAL TUNNEL RELEASE;  Surgeon: Daryll Brod, MD;  Location: [place];  Service: Orthopedics;  Laterality: Right;  . CATARACT EXTRACTION W/PHACO  05/28/2012   Procedure: CATARACT EXTRACTION PHACO AND INTRAOCULAR LENS PLACEMENT (IOC);  Surgeon: Tonny Branch, MD;  [facility];  Service: Ophthalmology;  Laterality: Right;  CDE=12.84  . CATARACT EXTRACTION W/PHACO  06/07/2012   Procedure: CATARACT EXTRACTION PHACO AND INTRAOCULAR LENS PLACEMENT (IOC);  Surgeon: Tonny Branch, MD;  [facility];  Service: Ophthalmology;  Laterality: Left;  CDE 17.60  . COLONOSCOPY    . COLONOSCOPY N/A 07/08/2015   Procedure: COLONOSCOPY;  Surgeon: Rogene Houston, MD;  [facility];  Service: Endoscopy;  Laterality: N/A;  930  . ESOPHAGEAL DILATION N/A 07/08/2015   Procedure: ESOPHAGEAL DILATION;  Surgeon: Rogene Houston, MD;  [facility];  Service: Endoscopy;  Laterality: N/A;  . ESOPHAGOGASTRODUODENOSCOPY N/A 07/08/2015   Procedure: ESOPHAGOGASTRODUODENOSCOPY (EGD);  Surgeon: Rogene Houston, MD;  [facility];  Service: Endoscopy;  Laterality: N/A;  . Hernia     Right inguinal  . HERNIA REPAIR  1961   right inguinal hernia  . UPPER GASTROINTESTINAL ENDOSCOPY      There were no vitals filed for this visit.  Subjective Assessment - 12/25/18 1225    Subjective  COVID-19 screen performed prior to patient entering clinic.  Pain high today.    Pertinent History  Scoliosis, hiatial hernia, osteopenia, allergy to tape    Limitations  Sitting;House hold activities;Standing;Walking    How long can you stand comfortably?  20 minutes    How long can you walk comfortably?  20-30 minutes max    Patient Stated Goals  improve ability to perform home activities with less pain    Currently in Pain?  Yes    Pain Score  9     Pain Location  Back    Pain Orientation  Right;Lower    Pain Descriptors / Indicators  Discomfort;Tightness  Pain Type  Chronic pain    Pain Onset  More than a month ago                       [facility] Adult PT Treatment/Exercise - 12/25/18 0001      Modalities   Modalities  Electrical Stimulation;Moist Heat;Traction      Moist Heat Therapy   Number Minutes Moist Heat  20 Minutes    Moist Heat Location  Lumbar Spine      Electrical Stimulation   Electrical Stimulation Location  Bilateral LB    Electrical Stimulation Action  IFC    Electrical Stimulation Parameters  80-150 Hz x 20 minutes.    Electrical Stimulation Goals  Pain      Traction   Type of Traction  Lumbar    Min (lbs)  5    Max (lbs)  80    Hold Time  99    Rest Time  5    Time  20      Manual Therapy    Manual Therapy  Soft tissue mobilization    Soft tissue mobilization  STW/M to affected low back with emphasis on right side to elease the QL x 10 minutes.                  PT Long Term Goals - 10/30/18 1121      PT LONG TERM GOAL #1   Title  Patient will be independent with HEP    Time  6    Period  Weeks    Status  Achieved      PT LONG TERM GOAL #2   Title  Patient will report ability to perfrom ADLs with low back pain less than or equal to 4/10    Time  6    Period  Weeks    Status  On-going   slow to perform     PT LONG TERM GOAL #3   Title  Patient will demonstrate 4/5 or greater bilateral LE MMT to improve stability during functional tasks.    Time  6    Period  Weeks    Status  On-going      PT LONG TERM GOAL #4   Title  Patient will report ability to stand for 20 minutes or greater with low back pain less than or equal to 5/10 to improve ability to perform home tasks.     Time  6    Period  Weeks    Status  Partially Met   varies on the day but overall can complete           Plan - 12/25/18 1234    Clinical Impression Statement  The patient continues to have a great deal of tenderness over her right QL.  She tolerated a 5# increase in traction without complaint.    Stability/Clinical Decision Making  Evolving/Moderate complexity    Rehab Potential  Good    PT Frequency  2x / week    PT Duration  6 weeks    PT Treatment/Interventions  Electrical Stimulation;ADLs/Self Care Home Management;Cryotherapy;Traction;Ultrasound;Moist Heat;Iontophoresis 52m/ml Dexamethasone;Gait training;Stair training;Manual techniques;Dry needling;Passive range of motion;Balance training;Neuromuscular re-education;Therapeutic exercise;Therapeutic activities;Patient/family education    PT Next Visit Plan  maintain traction at 75# cont STW/M to right low back    PT Home Exercise Plan  see patient education section    Consulted and Agree with Plan of Care  Patient  Patient will benefit from skilled therapeutic intervention in order to improve the following deficits and impairments:  Abnormal gait, Pain, Postural dysfunction, Decreased activity tolerance, Decreased range of motion, Decreased strength  Visit Diagnosis: Chronic bilateral low back pain without sciatica  Abnormal posture  Muscle weakness (generalized)     Problem List Patient Active Problem List   Diagnosis Date Noted  . SVT (supraventricular tachycardia) ([place]) 08/28/2018  . Elevated troponin 08/28/2018  . Light headedness 05/09/2018  . Osteopenia of the elderly 02/19/2014  . DDD (degenerative disc disease), lumbar 12/03/2013  . Scoliosis 12/03/2013  . Chronic LBP 05/14/2013  . IBS (irritable bowel syndrome) 11/08/2012  . UC (ulcerative colitis) ([place]) 04/23/2012  . Palpitation 10/26/2011  . Hypertension 10/26/2011  . Chronic ulcerative colitis ([place]) 03/23/2011    Patient Active Problem List   Diagnosis Date Noted  . SVT (supraventricular tachycardia) ([place]) 08/28/2018  . Elevated troponin 08/28/2018  . Light headedness 05/09/2018  . Osteopenia of the elderly 02/19/2014  . DDD (degenerative disc disease), lumbar 12/03/2013  . Scoliosis 12/03/2013  . Chronic LBP 05/14/2013  . IBS (irritable bowel syndrome) 11/08/2012  . UC (ulcerative colitis) ([place]) 04/23/2012  . Palpitation 10/26/2011  . Hypertension 10/26/2011  . Chronic ulcerative colitis ([place]) 03/23/2011   Progress Note Reporting Period 06/26/18 to 12/25/18  See note below for Objective Data and Assessment of Progress/Goals. Continued high pain-levels especially in right low back and no goals meant.     [place], [place] MPT 12/25/2018, 12:46 PM  [facility] [address] [place], [place], [address] Phone: [phone]   Fax:  [phone]  Name: Kaarin Pardy MRN: [id number] Date of Birth: 22-Dec-1935

## 2018-12-26 DIAGNOSIS — K51018 Ulcerative (chronic) pancolitis with other complication: Secondary | ICD-10-CM | POA: Diagnosis not present

## 2019-01-01 ENCOUNTER — Ambulatory Visit: Payer: Medicare Other | Admitting: Physical Therapy

## 2019-01-05 LAB — CALPROTECTIN, FECAL: Calprotectin, Fecal: 20 ug/g (ref 0–120)

## 2019-01-08 ENCOUNTER — Ambulatory Visit: Payer: Medicare Other | Attending: Orthopaedic Surgery | Admitting: *Deleted

## 2019-01-08 ENCOUNTER — Other Ambulatory Visit: Payer: Self-pay

## 2019-01-08 DIAGNOSIS — M545 Low back pain, unspecified: Secondary | ICD-10-CM

## 2019-01-08 DIAGNOSIS — M546 Pain in thoracic spine: Secondary | ICD-10-CM | POA: Insufficient documentation

## 2019-01-08 DIAGNOSIS — G8929 Other chronic pain: Secondary | ICD-10-CM | POA: Insufficient documentation

## 2019-01-08 DIAGNOSIS — R293 Abnormal posture: Secondary | ICD-10-CM | POA: Diagnosis not present

## 2019-01-08 DIAGNOSIS — M6281 Muscle weakness (generalized): Secondary | ICD-10-CM | POA: Insufficient documentation

## 2019-01-08 NOTE — Therapy (Signed)
[facility] [place], [place], [address] Phone: [phone]   Fax:  [phone]  Physical Therapy Treatment  Patient Details  Name: Lori Soto MRN: [id number] Date of Birth: 14-Dec-1935 Referring Provider (PT): Beau Fanny, [place]   Encounter Date: 01/08/2019  PT End of Session - 01/08/19 1145    Visit Number  21    Number of Visits  24    Date for PT Re-Evaluation  01/18/19    PT Start Time  1115    PT Stop Time  4081    PT Time Calculation (min)  50 min       Past Medical History:  Diagnosis Date  . Anemia   . Arthritis   . Cancer ([place])    skin cancer on nose  . Carpal tunnel syndrome, bilateral   . Cataract   . Esophagitis   . GERD (gastroesophageal reflux disease)   . H/O hiatal hernia   . Osteopenia   . Scoliosis   . Shingles   . SVT (supraventricular tachycardia) ([place])   . Ulcerative colitis     Past Surgical History:  Procedure Laterality Date  . ABDOMINAL HYSTERECTOMY    . BUNIONECTOMY     Bilateral  . CARPAL TUNNEL RELEASE Left 03/10/2015   Procedure: LEFT CARPAL TUNNEL RELEASE;  Surgeon: Daryll Brod, MD;  Location: [place];  Service: Orthopedics;  Laterality: Left;  . CARPAL TUNNEL RELEASE Right 10/31/2017   Procedure: RIGHT CARPAL TUNNEL RELEASE;  Surgeon: Daryll Brod, MD;  Location: [place];  Service: Orthopedics;  Laterality: Right;  . CATARACT EXTRACTION W/PHACO  05/28/2012   Procedure: CATARACT EXTRACTION PHACO AND INTRAOCULAR LENS PLACEMENT (IOC);  Surgeon: Tonny Branch, MD;  [facility];  Service: Ophthalmology;  Laterality: Right;  CDE=12.84  . CATARACT EXTRACTION W/PHACO  06/07/2012   Procedure: CATARACT EXTRACTION PHACO AND INTRAOCULAR LENS PLACEMENT (IOC);  Surgeon: Tonny Branch, MD;  [facility];  Service: Ophthalmology;  Laterality: Left;  CDE 17.60  . COLONOSCOPY    . COLONOSCOPY N/A 07/08/2015   Procedure: COLONOSCOPY;  Surgeon: Rogene Houston, MD;  [facility];  Service: Endoscopy;  Laterality: N/A;  930  . ESOPHAGEAL DILATION N/A 07/08/2015   Procedure: ESOPHAGEAL DILATION;  Surgeon: Rogene Houston, MD;  [facility];  Service: Endoscopy;  Laterality: N/A;  . ESOPHAGOGASTRODUODENOSCOPY N/A 07/08/2015   Procedure: ESOPHAGOGASTRODUODENOSCOPY (EGD);  Surgeon: Rogene Houston, MD;  [facility];  Service: Endoscopy;  Laterality: N/A;  . Hernia     Right inguinal  . HERNIA REPAIR  1961   right inguinal hernia  . UPPER GASTROINTESTINAL ENDOSCOPY      There were no vitals filed for this visit.                    [facility] Adult PT Treatment/Exercise - 01/08/19 0001      Modalities   Modalities  Electrical Stimulation;Moist Heat;Traction      Moist Heat Therapy   Number Minutes Moist Heat  12 Minutes    Moist Heat Location  Lumbar Spine      Electrical Stimulation   Electrical Stimulation Location  Bilateral LB    Electrical Stimulation Action  IFC x 15 mins 80-150hz     Electrical Stimulation Goals  Pain      Traction   Type of Traction  Lumbar    Min (lbs)  5    Max (lbs)  80  Hold Time  99    Rest Time  5    Time  15      Manual Therapy   Manual Therapy  Soft tissue mobilization    Soft tissue mobilization  STW/M to affected low back with emphasis on right side to elease the QL x 10 minutes.                  PT Long Term Goals - 10/30/18 1121      PT LONG TERM GOAL #1   Title  Patient will be independent with HEP    Time  6    Period  Weeks    Status  Achieved      PT LONG TERM GOAL #2   Title  Patient will report ability to perfrom ADLs with low back pain less than or equal to 4/10    Time  6    Period  Weeks    Status  On-going   slow to perform     PT LONG TERM GOAL #3   Title  Patient will demonstrate 4/5 or greater bilateral LE MMT to improve stability during functional tasks.    Time  6    Period  Weeks    Status  On-going       PT LONG TERM GOAL #4   Title  Patient will report ability to stand for 20 minutes or greater with low back pain less than or equal to 5/10 to improve ability to perform home tasks.     Time  6    Period  Weeks    Status  Partially Met   varies on the day but overall can complete           Plan - 01/08/19 1212    Clinical Impression Statement  Patient arrived today with c/o pain along both sides of her LB. She had notable tightness bil. LB paras and QL with decreased pain end of session. Pt did well again with traction and normal modality response today.    Stability/Clinical Decision Making  Evolving/Moderate complexity    Rehab Potential  Good    PT Frequency  2x / week    PT Duration  6 weeks    PT Treatment/Interventions  Electrical Stimulation;ADLs/Self Care Home Management;Cryotherapy;Traction;Ultrasound;Moist Heat;Iontophoresis 84m/ml Dexamethasone;Gait training;Stair training;Manual techniques;Dry needling;Passive range of motion;Balance training;Neuromuscular re-education;Therapeutic exercise;Therapeutic activities;Patient/family education    PT Next Visit Plan  cont STW/M to right low back/ Traction    PT Home Exercise Plan  see patient education section    Consulted and Agree with Plan of Care  Patient       Patient will benefit from skilled therapeutic intervention in order to improve the following deficits and impairments:  Abnormal gait, Pain, Postural dysfunction, Decreased activity tolerance, Decreased range of motion, Decreased strength  Visit Diagnosis: Chronic bilateral low back pain without sciatica  Abnormal posture  Muscle weakness (generalized)  Pain in thoracic spine     Problem List Patient Active Problem List   Diagnosis Date Noted  . SVT (supraventricular tachycardia) ([place] 08/28/2018  . Elevated troponin 08/28/2018  . Light headedness 05/09/2018  . Osteopenia of the elderly 02/19/2014  . DDD (degenerative disc disease), lumbar 12/03/2013  .  Scoliosis 12/03/2013  . Chronic LBP 05/14/2013  . IBS (irritable bowel syndrome) 11/08/2012  . UC ([place]cerative colitis) ([place] 04/23/2012  . Palpitation 10/26/2011  . Hypertension 10/26/2011  . [place] 03/23/2011    Guerino Caporale,CHRIS, PTA 01/08/2019, 12:17  PM  [facility] [place], [place], [address] Phone: [phone]   Fax:  [phone]  Name: Skylan Gift MRN: [id number] Date of Birth: [birth year]

## 2019-01-22 ENCOUNTER — Other Ambulatory Visit: Payer: Self-pay

## 2019-01-22 ENCOUNTER — Ambulatory Visit: Payer: Medicare Other | Admitting: *Deleted

## 2019-01-22 DIAGNOSIS — G8929 Other chronic pain: Secondary | ICD-10-CM

## 2019-01-22 DIAGNOSIS — M6281 Muscle weakness (generalized): Secondary | ICD-10-CM | POA: Diagnosis not present

## 2019-01-22 DIAGNOSIS — M545 Low back pain, unspecified: Secondary | ICD-10-CM

## 2019-01-22 DIAGNOSIS — R293 Abnormal posture: Secondary | ICD-10-CM

## 2019-01-22 DIAGNOSIS — M546 Pain in thoracic spine: Secondary | ICD-10-CM

## 2019-01-22 NOTE — Therapy (Signed)
[facility] [place], [place], [address] Phone: [phone]   Fax:  [phone]  Physical Therapy Treatment  Patient Details  Name: Lori Soto MRN: [id number] Date of Birth: Oct 19, 1935 Referring Provider (PT): Beau Fanny, [place]   Encounter Date: 01/22/2019  PT End of Session - 01/22/19 1124    Visit Number  22    Number of Visits  24    Date for PT Re-Evaluation  01/18/19    PT Start Time  1115    PT Stop Time  1205    PT Time Calculation (min)  50 min       Past Medical History:  Diagnosis Date  . Anemia   . Arthritis   . Cancer ([place])    skin cancer on nose  . Carpal tunnel syndrome, bilateral   . Cataract   . Esophagitis   . GERD (gastroesophageal reflux disease)   . H/O hiatal hernia   . Osteopenia   . Scoliosis   . Shingles   . SVT (supraventricular tachycardia) ([place])   . Ulcerative colitis     Past Surgical History:  Procedure Laterality Date  . ABDOMINAL HYSTERECTOMY    . BUNIONECTOMY     Bilateral  . CARPAL TUNNEL RELEASE Left 03/10/2015   Procedure: LEFT CARPAL TUNNEL RELEASE;  Surgeon: Daryll Brod, MD;  Location: [facility];  Service: Orthopedics;  Laterality: Left;  . CARPAL TUNNEL RELEASE Right 10/31/2017   Procedure: RIGHT CARPAL TUNNEL RELEASE;  Surgeon: Daryll Brod, MD;  Location: [facility];  Service: Orthopedics;  Laterality: Right;  . CATARACT EXTRACTION W/PHACO  05/28/2012   Procedure: CATARACT EXTRACTION PHACO AND INTRAOCULAR LENS PLACEMENT (IOC);  Surgeon: Tonny Branch, MD;  [facility];  Service: Ophthalmology;  Laterality: Right;  CDE=12.84  . CATARACT EXTRACTION W/PHACO  06/07/2012   Procedure: CATARACT EXTRACTION PHACO AND INTRAOCULAR LENS PLACEMENT (IOC);  Surgeon: Tonny Branch, MD;  [facility];  Service: Ophthalmology;  Laterality: Left;  CDE 17.60  . COLONOSCOPY    . COLONOSCOPY N/A 07/08/2015   Procedure: COLONOSCOPY;  Surgeon: Rogene Houston, MD;  [facility];  Service: Endoscopy;  Laterality: N/A;  930  . ESOPHAGEAL DILATION N/A 07/08/2015   Procedure: ESOPHAGEAL DILATION;  Surgeon: Rogene Houston, MD;  [facility];  Service: Endoscopy;  Laterality: N/A;  . ESOPHAGOGASTRODUODENOSCOPY N/A 07/08/2015   Procedure: ESOPHAGOGASTRODUODENOSCOPY (EGD);  Surgeon: Rogene Houston, MD;  [facility];  Service: Endoscopy;  Laterality: N/A;  . Hernia     Right inguinal  . HERNIA REPAIR  1961   right inguinal hernia  . UPPER GASTROINTESTINAL ENDOSCOPY      There were no vitals filed for this visit.  Subjective Assessment - 01/22/19 1200    Subjective  COVID-19 screen performed prior to patient entering clinic.  Pain high today on RT side    Pertinent History  Scoliosis, hiatial hernia, osteopenia, allergy to tape    How long can you stand comfortably?  20 minutes    How long can you walk comfortably?  20-30 minutes max    Diagnostic tests  x-ray    Patient Stated Goals  improve ability to perform home activities with less pain    Pain Score  8     Pain Location  Back    Pain Orientation  Right;Lower    Pain Descriptors / Indicators  Tightness;Discomfort    Pain Onset  More than a  month ago                       [facility] Adult PT Treatment/Exercise - 01/22/19 0001      Modalities   Modalities  Electrical Stimulation;Moist Heat;Traction      Moist Heat Therapy   Number Minutes Moist Heat  15 Minutes    Moist Heat Location  Lumbar Spine      Electrical Stimulation   Electrical Stimulation Location  Bilateral LB    Electrical Stimulation Action  IFC x 15 mins 80-150hz     Electrical Stimulation Goals  Pain      Traction   Type of Traction  Lumbar    Min (lbs)  5    Max (lbs)  80    Hold Time  99    Rest Time  5    Time  15      Manual Therapy   Manual Therapy  Soft tissue mobilization    Soft tissue mobilization  STW/M to affected low back with emphasis on right side  to release the QL  with Pt prone                  PT Long Term Goals - 10/30/18 1121      PT LONG TERM GOAL #1   Title  Patient will be independent with HEP    Time  6    Period  Weeks    Status  Achieved      PT LONG TERM GOAL #2   Title  Patient will report ability to perfrom ADLs with low back pain less than or equal to 4/10    Time  6    Period  Weeks    Status  On-going   slow to perform     PT LONG TERM GOAL #3   Title  Patient will demonstrate 4/5 or greater bilateral LE MMT to improve stability during functional tasks.    Time  6    Period  Weeks    Status  On-going      PT LONG TERM GOAL #4   Title  Patient will report ability to stand for 20 minutes or greater with low back pain less than or equal to 5/10 to improve ability to perform home tasks.     Time  6    Period  Weeks    Status  Partially Met   varies on the day but overall can complete           Plan - 01/22/19 1157    Clinical Impression Statement  Pt arrived today doing fair , but with RT sided tightness and pain. She did well with STW with some release on RT side in QL and paras. Traction was performed at 80#s again and tolerated well. Decreased pain after session.    Stability/Clinical Decision Making  Evolving/Moderate complexity    Rehab Potential  Good    PT Frequency  2x / week    PT Duration  6 weeks    PT Treatment/Interventions  Electrical Stimulation;ADLs/Self Care Home Management;Cryotherapy;Traction;Ultrasound;Moist Heat;Iontophoresis 32m/ml Dexamethasone;Gait training;Stair training;Manual techniques;Dry needling;Passive range of motion;Balance training;Neuromuscular re-education;Therapeutic exercise;Therapeutic activities;Patient/family education    PT Next Visit Plan  cont STW/M to right low back/ Traction    PT Home Exercise Plan  see patient education section    Consulted and Agree with Plan of Care  Patient       Patient will benefit from skilled therapeutic  intervention in order to improve the following deficits and impairments:  Abnormal gait, Pain, Postural dysfunction, Decreased activity tolerance, Decreased range of motion, Decreased strength  Visit Diagnosis: Chronic bilateral low back pain without sciatica  Abnormal posture  Muscle weakness (generalized)  Pain in thoracic spine  Chronic right-sided low back pain without sciatica     Problem List Patient Active Problem List   Diagnosis Date Noted  . SVT (supraventricular tachycardia) ([place]) 08/28/2018  . Elevated troponin 08/28/2018  . Light headedness 05/09/2018  . Osteopenia of the elderly 02/19/2014  . DDD (degenerative disc disease), lumbar 12/03/2013  . Scoliosis 12/03/2013  . Chronic LBP 05/14/2013  . IBS (irritable bowel syndrome) 11/08/2012  . UC (ulcerative colitis) ([place]) 04/23/2012  . Palpitation 10/26/2011  . Hypertension 10/26/2011  . Chronic ulcerative colitis ([place]) 03/23/2011    Octaviano Mukai,CHRIS, PTA 01/22/2019, 12:17 PM  [facility] [place], [place], [address] Phone: [phone]   Fax:  [phone]  Name: Lori Soto MRN: [id number] Date of Birth: 05-16-1935

## 2019-01-29 ENCOUNTER — Telehealth: Payer: Self-pay | Admitting: Family Medicine

## 2019-01-29 ENCOUNTER — Ambulatory Visit: Payer: Medicare Other | Admitting: *Deleted

## 2019-01-29 ENCOUNTER — Other Ambulatory Visit: Payer: Self-pay

## 2019-01-29 ENCOUNTER — Encounter: Payer: Self-pay | Admitting: *Deleted

## 2019-01-29 DIAGNOSIS — M545 Low back pain: Secondary | ICD-10-CM | POA: Diagnosis not present

## 2019-01-29 DIAGNOSIS — M6281 Muscle weakness (generalized): Secondary | ICD-10-CM | POA: Diagnosis not present

## 2019-01-29 DIAGNOSIS — R293 Abnormal posture: Secondary | ICD-10-CM | POA: Diagnosis not present

## 2019-01-29 DIAGNOSIS — M546 Pain in thoracic spine: Secondary | ICD-10-CM

## 2019-01-29 DIAGNOSIS — G8929 Other chronic pain: Secondary | ICD-10-CM | POA: Diagnosis not present

## 2019-01-29 NOTE — Therapy (Signed)
[facility] [place], [place], [address] Phone: [phone]   Fax:  [phone]  Physical Therapy Treatment  Patient Details  Name: Lori Soto MRN: [id number] Date of Birth: 08/05/1935 Referring Provider (PT): Beau Fanny, [place]   Encounter Date: 01/29/2019  PT End of Session - 01/29/19 1207    Visit Number  23    Number of Visits  24    Date for PT Re-Evaluation  01/18/19    PT Start Time  1115    PT Stop Time  1206    PT Time Calculation (min)  51 min       Past Medical History:  Diagnosis Date  . Anemia   . Arthritis   . Cancer ([place])    skin cancer on nose  . Carpal tunnel syndrome, bilateral   . Cataract   . Esophagitis   . GERD (gastroesophageal reflux disease)   . H/O hiatal hernia   . Osteopenia   . Scoliosis   . Shingles   . SVT (supraventricular tachycardia) ([place])   . Ulcerative colitis     Past Surgical History:  Procedure Laterality Date  . ABDOMINAL HYSTERECTOMY    . BUNIONECTOMY     Bilateral  . CARPAL TUNNEL RELEASE Left 03/10/2015   Procedure: LEFT CARPAL TUNNEL RELEASE;  Surgeon: Daryll Brod, MD;  Location: [facility];  Service: Orthopedics;  Laterality: Left;  . CARPAL TUNNEL RELEASE Right 10/31/2017   Procedure: RIGHT CARPAL TUNNEL RELEASE;  Surgeon: Daryll Brod, MD;  Location: [facility];  Service: Orthopedics;  Laterality: Right;  . CATARACT EXTRACTION W/PHACO  05/28/2012   Procedure: CATARACT EXTRACTION PHACO AND INTRAOCULAR LENS PLACEMENT (IOC);  Surgeon: Tonny Branch, MD;  [facility];  Service: Ophthalmology;  Laterality: Right;  CDE=12.84  . CATARACT EXTRACTION W/PHACO  06/07/2012   Procedure: CATARACT EXTRACTION PHACO AND INTRAOCULAR LENS PLACEMENT (IOC);  Surgeon: Tonny Branch, MD;  [facility];  Service: Ophthalmology;  Laterality: Left;  CDE 17.60  . COLONOSCOPY    . COLONOSCOPY N/A 07/08/2015   Procedure: COLONOSCOPY;  Surgeon: Rogene Houston, MD;  [facility];  Service: Endoscopy;  Laterality: N/A;  930  . ESOPHAGEAL DILATION N/A 07/08/2015   Procedure: ESOPHAGEAL DILATION;  Surgeon: Rogene Houston, MD;  [facility];  Service: Endoscopy;  Laterality: N/A;  . ESOPHAGOGASTRODUODENOSCOPY N/A 07/08/2015   Procedure: ESOPHAGOGASTRODUODENOSCOPY (EGD);  Surgeon: Rogene Houston, MD;  [facility];  Service: Endoscopy;  Laterality: N/A;  . Hernia     Right inguinal  . HERNIA REPAIR  1961   right inguinal hernia  . UPPER GASTROINTESTINAL ENDOSCOPY      There were no vitals filed for this visit.  Subjective Assessment - 01/29/19 1205    Subjective  COVID-19 screen performed prior to patient entering clinic.  Pain on both sides today    Pertinent History  Scoliosis, hiatial hernia, osteopenia, allergy to tape    Limitations  Sitting;House hold activities;Standing;Walking    How long can you stand comfortably?  20 minutes    How long can you walk comfortably?  20-30 minutes max    Diagnostic tests  x-ray    Patient Stated Goals  improve ability to perform home activities with less pain    Currently in Pain?  Yes    Pain Score  6     Pain Location  Back    Pain Orientation  Right;Lower  Pain Descriptors / Indicators  Tightness;Discomfort    Pain Type  Chronic pain    Pain Onset  More than a month ago                       [facility] Adult PT Treatment/Exercise - 01/29/19 0001      Modalities   Modalities  Electrical Stimulation;Moist Heat;Traction      Moist Heat Therapy   Number Minutes Moist Heat  15 Minutes    Moist Heat Location  Lumbar Spine      Electrical Stimulation   Electrical Stimulation Location  Bilateral LB paras IFC x 15 mins 80-150hz     Electrical Stimulation Goals  Pain      Traction   Type of Traction  Lumbar    Min (lbs)  5    Max (lbs)  80    Hold Time  99    Rest Time  5    Time  15      Manual Therapy   Manual Therapy  Soft tissue  mobilization    Soft tissue mobilization  STW/M to low back paras with emphasis on right side to release the QL  with Pt prone                  PT Long Term Goals - 10/30/18 1121      PT LONG TERM GOAL #1   Title  Patient will be independent with HEP    Time  6    Period  Weeks    Status  Achieved      PT LONG TERM GOAL #2   Title  Patient will report ability to perfrom ADLs with low back pain less than or equal to 4/10    Time  6    Period  Weeks    Status  On-going   slow to perform     PT LONG TERM GOAL #3   Title  Patient will demonstrate 4/5 or greater bilateral LE MMT to improve stability during functional tasks.    Time  6    Period  Weeks    Status  On-going      PT LONG TERM GOAL #4   Title  Patient will report ability to stand for 20 minutes or greater with low back pain less than or equal to 5/10 to improve ability to perform home tasks.     Time  6    Period  Weeks    Status  Partially Met   varies on the day but overall can complete           Plan - 01/29/19 1210    Clinical Impression Statement  Pt arrived today doing a little better with decreased LBP after last Rx.She had notable tightness in both sides LB paras and QL. Responded well to RX with decreased pain after Rx. Pt did well with traction again at 80#s.    Stability/Clinical Decision Making  Evolving/Moderate complexity    Rehab Potential  Good    PT Frequency  2x / week    PT Duration  6 weeks    PT Treatment/Interventions  Electrical Stimulation;ADLs/Self Care Home Management;Cryotherapy;Traction;Ultrasound;Moist Heat;Iontophoresis 53m/ml Dexamethasone;Gait training;Stair training;Manual techniques;Dry needling;Passive range of motion;Balance training;Neuromuscular re-education;Therapeutic exercise;Therapeutic activities;Patient/family education    PT Next Visit Plan  cont STW/M to right low back/ Traction    PT Home Exercise Plan  see patient education section    Consulted and  Agree with Plan  of Care  Patient       Patient will benefit from skilled therapeutic intervention in order to improve the following deficits and impairments:  Abnormal gait, Pain, Postural dysfunction, Decreased activity tolerance, Decreased range of motion, Decreased strength  Visit Diagnosis: Abnormal posture  Chronic bilateral low back pain without sciatica  Muscle weakness (generalized)  Pain in thoracic spine  Chronic right-sided low back pain without sciatica     Problem List Patient Active Problem List   Diagnosis Date Noted  . SVT (supraventricular tachycardia) ([place]) 08/28/2018  . Elevated troponin 08/28/2018  . Light headedness 05/09/2018  . Osteopenia of the elderly 02/19/2014  . DDD (degenerative disc disease), lumbar 12/03/2013  . Scoliosis 12/03/2013  . Chronic LBP 05/14/2013  . IBS (irritable bowel syndrome) 11/08/2012  . UC (ulcerative colitis) ([place]) 04/23/2012  . Palpitation 10/26/2011  . Hypertension 10/26/2011  . Chronic ulcerative colitis ([place]) 03/23/2011    Braelynn Lupton,CHRIS, PTA 01/29/2019, 12:53 PM  [facility] [place], [place], [address] Phone: [phone]   Fax:  [phone]  Name: Vanetta Rule MRN: [id number] Date of Birth: Apr 03, 1936

## 2019-01-30 NOTE — Telephone Encounter (Signed)
Called and notified patient that we are not currently preforming exams but are working on starting the back in the near future.  Advised patient that we can make her an appointment at [facility].  Patient states that she would like to wait until we start performing exams in the office. I let patient know that I will touch base with her within the next month to let her know status.  MPulliam, CMA/RT(R)

## 2019-02-05 ENCOUNTER — Other Ambulatory Visit: Payer: Self-pay

## 2019-02-05 ENCOUNTER — Encounter: Payer: Self-pay | Admitting: Physical Therapy

## 2019-02-05 ENCOUNTER — Ambulatory Visit: Payer: Medicare Other | Attending: Orthopaedic Surgery | Admitting: Physical Therapy

## 2019-02-05 DIAGNOSIS — G8929 Other chronic pain: Secondary | ICD-10-CM | POA: Insufficient documentation

## 2019-02-05 DIAGNOSIS — M6281 Muscle weakness (generalized): Secondary | ICD-10-CM | POA: Diagnosis not present

## 2019-02-05 DIAGNOSIS — R293 Abnormal posture: Secondary | ICD-10-CM | POA: Insufficient documentation

## 2019-02-05 DIAGNOSIS — M545 Low back pain: Secondary | ICD-10-CM | POA: Diagnosis not present

## 2019-02-05 NOTE — Therapy (Signed)
[facility] [place], [place], [address] Phone: [phone]   Fax:  [phone]  Physical Therapy Treatment  Patient Details  Name: Lori Soto MRN: [id number] Date of Birth: 1935/07/28 Referring Provider (PT): Beau Fanny, [place]   Encounter Date: 02/05/2019  PT End of Session - 02/05/19 1209    Visit Number  24    Number of Visits  24    Date for PT Re-Evaluation  01/18/19    PT Start Time  1121    PT Stop Time  1208    PT Time Calculation (min)  47 min    Activity Tolerance  Patient tolerated treatment well    Behavior During Therapy  [facility] for tasks assessed/performed       Past Medical History:  Diagnosis Date  . Anemia   . Arthritis   . Cancer ([place])    skin cancer on nose  . Carpal tunnel syndrome, bilateral   . Cataract   . Esophagitis   . GERD (gastroesophageal reflux disease)   . H/O hiatal hernia   . Osteopenia   . Scoliosis   . Shingles   . SVT (supraventricular tachycardia) ([place])   . Ulcerative colitis     Past Surgical History:  Procedure Laterality Date  . ABDOMINAL HYSTERECTOMY    . BUNIONECTOMY     Bilateral  . CARPAL TUNNEL RELEASE Left 03/10/2015   Procedure: LEFT CARPAL TUNNEL RELEASE;  Surgeon: Daryll Brod, MD;  Location: [place];  Service: Orthopedics;  Laterality: Left;  . CARPAL TUNNEL RELEASE Right 10/31/2017   Procedure: RIGHT CARPAL TUNNEL RELEASE;  Surgeon: Daryll Brod, MD;  Location: [facility];  Service: Orthopedics;  Laterality: Right;  . CATARACT EXTRACTION W/PHACO  05/28/2012   Procedure: CATARACT EXTRACTION PHACO AND INTRAOCULAR LENS PLACEMENT (IOC);  Surgeon: Tonny Branch, MD;  [facility];  Service: Ophthalmology;  Laterality: Right;  CDE=12.84  . CATARACT EXTRACTION W/PHACO  06/07/2012   Procedure: CATARACT EXTRACTION PHACO AND INTRAOCULAR LENS PLACEMENT (IOC);  Surgeon: Tonny Branch, MD;  [facility];  Service: Ophthalmology;   Laterality: Left;  CDE 17.60  . COLONOSCOPY    . COLONOSCOPY N/A 07/08/2015   Procedure: COLONOSCOPY;  Surgeon: Rogene Houston, MD;  [facility];  Service: Endoscopy;  Laterality: N/A;  930  . ESOPHAGEAL DILATION N/A 07/08/2015   Procedure: ESOPHAGEAL DILATION;  Surgeon: Rogene Houston, MD;  [facility];  Service: Endoscopy;  Laterality: N/A;  . ESOPHAGOGASTRODUODENOSCOPY N/A 07/08/2015   Procedure: ESOPHAGOGASTRODUODENOSCOPY (EGD);  Surgeon: Rogene Houston, MD;  [facility];  Service: Endoscopy;  Laterality: N/A;  . Hernia     Right inguinal  . HERNIA REPAIR  1961   right inguinal hernia  . UPPER GASTROINTESTINAL ENDOSCOPY      There were no vitals filed for this visit.  Subjective Assessment - 02/05/19 1207    Subjective  COVID-19 screen performed prior to patient entering clinic.  Patient reports more pain sunday evening after church.    Pertinent History  Scoliosis, hiatial hernia, osteopenia, allergy to tape    Limitations  Sitting;House hold activities;Standing;Walking    How long can you stand comfortably?  20 minutes    How long can you walk comfortably?  20-30 minutes max    Diagnostic tests  x-ray    Patient Stated Goals  improve ability to perform home activities with less pain    Currently in Pain?  Yes  Pain Score  6     Pain Location  Back    Pain Orientation  Right;Lower    Pain Descriptors / Indicators  Tightness;Discomfort    Pain Type  Chronic pain    Pain Onset  More than a month ago    Pain Frequency  Constant         [facility] PT Assessment - 02/05/19 0001      Assessment   Medical Diagnosis  spondylosis without myelopathy or radiculopathy    Referring Provider (PT)  Beau Fanny, PA-C    Prior Therapy  yes      Precautions   Precautions  None      Restrictions   Weight Bearing Restrictions  No                   [facility] Adult PT Treatment/Exercise - 02/05/19 0001      Modalities   Modalities   Electrical Stimulation;Moist Heat;Traction      Moist Heat Therapy   Number Minutes Moist Heat  15 Minutes    Moist Heat Location  Lumbar Spine      Electrical Stimulation   Electrical Stimulation Location  B lumbar paraspinals    Electrical Stimulation Action  IFC    Electrical Stimulation Parameters  80-150 hz x15 min    Electrical Stimulation Goals  Pain      Traction   Type of Traction  Lumbar    Min (lbs)  5    Max (lbs)  80    Hold Time  99    Rest Time  5    Time  15      Manual Therapy   Manual Therapy  Soft tissue mobilization    Soft tissue mobilization  STW/M to lumbar paraspinals/glutes with emphasis on right side to release the QL  with Pt prone                  PT Long Term Goals - 10/30/18 1121      PT LONG TERM GOAL #1   Title  Patient will be independent with HEP    Time  6    Period  Weeks    Status  Achieved      PT LONG TERM GOAL #2   Title  Patient will report ability to perfrom ADLs with low back pain less than or equal to 4/10    Time  6    Period  Weeks    Status  On-going   slow to perform     PT LONG TERM GOAL #3   Title  Patient will demonstrate 4/5 or greater bilateral LE MMT to improve stability during functional tasks.    Time  6    Period  Weeks    Status  On-going      PT LONG TERM GOAL #4   Title  Patient will report ability to stand for 20 minutes or greater with low back pain less than or equal to 5/10 to improve ability to perform home tasks.     Time  6    Period  Weeks    Status  Partially Met   varies on the day but overall can complete           Plan - 02/05/19 1217    Clinical Impression Statement  Patient presented in clinic with mid level pain that is reported as her base. Patient is more tolerable in the mornings per patient report. Patient more  tender around R SI joint and into inferior QL and superior glute in the surrounding region. Normal modalities response noted folllowing removal of the  modalties.    Stability/Clinical Decision Making  Evolving/Moderate complexity    Rehab Potential  Good    PT Frequency  2x / week    PT Duration  6 weeks    PT Treatment/Interventions  Electrical Stimulation;ADLs/Self Care Home Management;Cryotherapy;Traction;Ultrasound;Moist Heat;Iontophoresis 54m/ml Dexamethasone;Gait training;Stair training;Manual techniques;Dry needling;Passive range of motion;Balance training;Neuromuscular re-education;Therapeutic exercise;Therapeutic activities;Patient/family education    PT Next Visit Plan  cont STW/M to right low back/ Traction    PT Home Exercise Plan  see patient education section    Consulted and Agree with Plan of Care  Patient       Patient will benefit from skilled therapeutic intervention in order to improve the following deficits and impairments:  Abnormal gait, Pain, Postural dysfunction, Decreased activity tolerance, Decreased range of motion, Decreased strength  Visit Diagnosis: Abnormal posture  Chronic bilateral low back pain without sciatica  Muscle weakness (generalized)     Problem List Patient Active Problem List   Diagnosis Date Noted  . SVT (supraventricular tachycardia) ([place] 08/28/2018  . Elevated troponin 08/28/2018  . Light headedness 05/09/2018  . Osteopenia of the elderly 02/19/2014  . DDD (degenerative disc disease), lumbar 12/03/2013  . Scoliosis 12/03/2013  . Chronic LBP 05/14/2013  . IBS (irritable bowel syndrome) 11/08/2012  . UC (ulcerative colitis) ([place] 04/23/2012  . Palpitation 10/26/2011  . Hypertension 10/26/2011  . Chronic ulcerative colitis ([place] 03/23/2011    KStandley Brooking PTA 02/05/19 12:22 PM   [facility] [address] [address]Phone: [phone]  Fax:  [phone] Name: Lori SulakMRN: [id number] of Birth: 806-Jan-1937

## 2019-02-08 ENCOUNTER — Other Ambulatory Visit (INDEPENDENT_AMBULATORY_CARE_PROVIDER_SITE_OTHER): Payer: Self-pay | Admitting: Internal Medicine

## 2019-02-08 NOTE — Addendum Note (Signed)
Addended by: APPLEGATE, [place] W on: 02/08/2019 12:40 PM   Modules accepted: Orders

## 2019-02-12 ENCOUNTER — Ambulatory Visit: Payer: Medicare Other | Admitting: Physical Therapy

## 2019-02-12 ENCOUNTER — Other Ambulatory Visit: Payer: Self-pay

## 2019-02-12 ENCOUNTER — Encounter: Payer: Self-pay | Admitting: Physical Therapy

## 2019-02-12 DIAGNOSIS — Z1231 Encounter for screening mammogram for malignant neoplasm of breast: Secondary | ICD-10-CM | POA: Diagnosis not present

## 2019-02-12 DIAGNOSIS — G8929 Other chronic pain: Secondary | ICD-10-CM | POA: Diagnosis not present

## 2019-02-12 DIAGNOSIS — R293 Abnormal posture: Secondary | ICD-10-CM

## 2019-02-12 DIAGNOSIS — M6281 Muscle weakness (generalized): Secondary | ICD-10-CM | POA: Diagnosis not present

## 2019-02-12 DIAGNOSIS — M545 Low back pain: Secondary | ICD-10-CM | POA: Diagnosis not present

## 2019-02-12 NOTE — Therapy (Signed)
[facility] [place], [place], [address] Phone: [phone]   Fax:  [phone]  Physical Therapy Treatment  Patient Details  Name: Lori Soto MRN: [id number] Date of Birth: [birth year] Referring Provider (PT): Beau Fanny, [place]   Encounter Date: 02/12/2019  PT End of Session - 02/12/19 1200    Visit Number  25    Number of Visits  28    Date for PT Re-Evaluation  03/08/19    PT Start Time  4496    PT Stop Time  1214    PT Time Calculation (min)  50 min    Behavior During Therapy  [facility] for tasks assessed/performed       Past Medical History:  Diagnosis Date  . Anemia   . Arthritis   . Cancer ([place])    skin cancer on nose  . Carpal tunnel syndrome, bilateral   . Cataract   . Esophagitis   . GERD (gastroesophageal reflux disease)   . H/O hiatal hernia   . Osteopenia   . Scoliosis   . Shingles   . SVT (supraventricular tachycardia) ([place])   . Ulcerative colitis     Past Surgical History:  Procedure Laterality Date  . ABDOMINAL HYSTERECTOMY    . BUNIONECTOMY     Bilateral  . CARPAL TUNNEL RELEASE Left 03/10/2015   Procedure: LEFT CARPAL TUNNEL RELEASE;  Surgeon: Daryll Brod, MD;  Location: [place];  Service: Orthopedics;  Laterality: Left;  . CARPAL TUNNEL RELEASE Right 10/31/2017   Procedure: RIGHT CARPAL TUNNEL RELEASE;  Surgeon: Daryll Brod, MD;  Location: [place];  Service: Orthopedics;  Laterality: Right;  . CATARACT EXTRACTION W/PHACO  05/28/2012   Procedure: CATARACT EXTRACTION PHACO AND INTRAOCULAR LENS PLACEMENT (IOC);  Surgeon: Tonny Branch, MD;  [facility];  Service: Ophthalmology;  Laterality: Right;  CDE=12.84  . CATARACT EXTRACTION W/PHACO  06/07/2012   Procedure: CATARACT EXTRACTION PHACO AND INTRAOCULAR LENS PLACEMENT (IOC);  Surgeon: Tonny Branch, MD;  [facility];  Service: Ophthalmology;  Laterality: Left;  CDE 17.60  . COLONOSCOPY    .  COLONOSCOPY N/A 07/08/2015   Procedure: COLONOSCOPY;  Surgeon: Rogene Houston, MD;  [facility];  Service: Endoscopy;  Laterality: N/A;  930  . ESOPHAGEAL DILATION N/A 07/08/2015   Procedure: ESOPHAGEAL DILATION;  Surgeon: Rogene Houston, MD;  [facility];  Service: Endoscopy;  Laterality: N/A;  . ESOPHAGOGASTRODUODENOSCOPY N/A 07/08/2015   Procedure: ESOPHAGOGASTRODUODENOSCOPY (EGD);  Surgeon: Rogene Houston, MD;  [facility];  Service: Endoscopy;  Laterality: N/A;  . Hernia     Right inguinal  . HERNIA REPAIR  1961   right inguinal hernia  . UPPER GASTROINTESTINAL ENDOSCOPY      There were no vitals filed for this visit.  Subjective Assessment - 02/12/19 1159    Subjective  COVID-19 screen performed prior to patient entering clinic.  Patient reports more pain yesterday as she was very busy.    Pertinent History  Scoliosis, hiatial hernia, osteopenia, allergy to tape    Limitations  Sitting;House hold activities;Standing;Walking    How long can you stand comfortably?  20 minutes    How long can you walk comfortably?  20-30 minutes max    Diagnostic tests  x-ray    Patient Stated Goals  improve ability to perform home activities with less pain    Currently in Pain?  Yes    Pain Score  8  Pain Location  Back    Pain Orientation  Right;Lower    Pain Descriptors / Indicators  Discomfort    Pain Type  Chronic pain    Pain Onset  More than a month ago    Pain Frequency  Constant         [facility] PT Assessment - 02/12/19 0001      Assessment   Medical Diagnosis  spondylosis without myelopathy or radiculopathy    Referring Provider (PT)  Beau Fanny, PA-C    Prior Therapy  yes      Precautions   Precautions  None      Restrictions   Weight Bearing Restrictions  No                   [facility] Adult PT Treatment/Exercise - 02/12/19 0001      Modalities   Modalities  Electrical Stimulation;Moist Heat;Traction      Moist Heat  Therapy   Number Minutes Moist Heat  15 Minutes    Moist Heat Location  Lumbar Spine      Electrical Stimulation   Electrical Stimulation Location  R low back    Electrical Stimulation Action  Pre-Mod    Electrical Stimulation Parameters  80-150 hz x15 min    Electrical Stimulation Goals  Pain      Traction   Type of Traction  Lumbar    Min (lbs)  5    Max (lbs)  80    Hold Time  99    Rest Time  5    Time  15      Manual Therapy   Manual Therapy  Soft tissue mobilization    Soft tissue mobilization  STW/M to lumbar paraspinals/glutes with emphasis on right side to release the QL  with Pt prone                  PT Long Term Goals - 10/30/18 1121      PT LONG TERM GOAL #1   Title  Patient will be independent with HEP    Time  6    Period  Weeks    Status  Achieved      PT LONG TERM GOAL #2   Title  Patient will report ability to perfrom ADLs with low back pain less than or equal to 4/10    Time  6    Period  Weeks    Status  On-going   slow to perform     PT LONG TERM GOAL #3   Title  Patient will demonstrate 4/5 or greater bilateral LE MMT to improve stability during functional tasks.    Time  6    Period  Weeks    Status  On-going      PT LONG TERM GOAL #4   Title  Patient will report ability to stand for 20 minutes or greater with low back pain less than or equal to 5/10 to improve ability to perform home tasks.     Time  6    Period  Weeks    Status  Partially Met   varies on the day but overall can complete           Plan - 02/12/19 1222    Clinical Impression Statement  Patient presented in clinic with increased R LBP that has continued since yesterday. Pain presents still after increased activity. Patient still intially very sore and tender along inferior R QL and superior glute musculature.  Normal modalities response noted following removal of the modalities.    Stability/Clinical Decision Making  Evolving/Moderate complexity    Rehab  Potential  Good    PT Frequency  2x / week    PT Duration  6 weeks    PT Treatment/Interventions  Electrical Stimulation;ADLs/Self Care Home Management;Cryotherapy;Traction;Ultrasound;Moist Heat;Iontophoresis 70m/ml Dexamethasone;Gait training;Stair training;Manual techniques;Dry needling;Passive range of motion;Balance training;Neuromuscular re-education;Therapeutic exercise;Therapeutic activities;Patient/family education    PT Next Visit Plan  cont STW/M to right low back/ Traction    PT Home Exercise Plan  see patient education section    Consulted and Agree with Plan of Care  Patient       Patient will benefit from skilled therapeutic intervention in order to improve the following deficits and impairments:  Abnormal gait, Pain, Postural dysfunction, Decreased activity tolerance, Decreased range of motion, Decreased strength  Visit Diagnosis: Abnormal posture  Chronic bilateral low back pain without sciatica     Problem List Patient Active Problem List   Diagnosis Date Noted  . SVT (supraventricular tachycardia) ([place] 08/28/2018  . Elevated troponin 08/28/2018  . Light headedness 05/09/2018  . Osteopenia of the elderly 02/19/2014  . DDD (degenerative disc disease), lumbar 12/03/2013  . Scoliosis 12/03/2013  . Chronic LBP 05/14/2013  . IBS (irritable bowel syndrome) 11/08/2012  . UC (ulcerative colitis) ([place] 04/23/2012  . Palpitation 10/26/2011  . Hypertension 10/26/2011  . Chronic ulcerative colitis ([place] 03/23/2011    KStandley Brooking PTA 02/12/2019, 12:26 PM  [facility] [address] [address]Phone: [phone]  Fax:  [phone] Name: BTraci GaffordMRN: [id number] of Birth: 804-30-1937

## 2019-02-13 LAB — HM MAMMOGRAPHY

## 2019-02-19 ENCOUNTER — Ambulatory Visit: Payer: Medicare Other | Admitting: Physical Therapy

## 2019-02-19 ENCOUNTER — Encounter: Payer: Self-pay | Admitting: Physical Therapy

## 2019-02-19 ENCOUNTER — Other Ambulatory Visit: Payer: Self-pay

## 2019-02-19 DIAGNOSIS — M6281 Muscle weakness (generalized): Secondary | ICD-10-CM | POA: Diagnosis not present

## 2019-02-19 DIAGNOSIS — R293 Abnormal posture: Secondary | ICD-10-CM

## 2019-02-19 DIAGNOSIS — G8929 Other chronic pain: Secondary | ICD-10-CM

## 2019-02-19 DIAGNOSIS — M545 Low back pain: Secondary | ICD-10-CM

## 2019-02-19 NOTE — Therapy (Signed)
[facility] [place], [place], [address] Phone: [phone]   Fax:  [phone]  Physical Therapy Treatment  Patient Details  Name: Lori Soto MRN: [id number] Date of Birth: [birth year] Referring Provider (PT): Beau Fanny, [place]   Encounter Date: 02/19/2019  PT End of Session - 02/19/19 1133    Visit Number  26    Number of Visits  28    Date for PT Re-Evaluation  03/08/19    PT Start Time  2979    PT Stop Time  1213    PT Time Calculation (min)  47 min    Activity Tolerance  Patient tolerated treatment well    Behavior During Therapy  [facility] for tasks assessed/performed       Past Medical History:  Diagnosis Date  . Anemia   . Arthritis   . Cancer ([place])    skin cancer on nose  . Carpal tunnel syndrome, bilateral   . Cataract   . Esophagitis   . GERD (gastroesophageal reflux disease)   . H/O hiatal hernia   . Osteopenia   . Scoliosis   . Shingles   . SVT (supraventricular tachycardia) ([place])   . Ulcerative colitis     Past Surgical History:  Procedure Laterality Date  . ABDOMINAL HYSTERECTOMY    . BUNIONECTOMY     Bilateral  . CARPAL TUNNEL RELEASE Left 03/10/2015   Procedure: LEFT CARPAL TUNNEL RELEASE;  Surgeon: Daryll Brod, MD;  Location: [place];  Service: Orthopedics;  Laterality: Left;  . CARPAL TUNNEL RELEASE Right 10/31/2017   Procedure: RIGHT CARPAL TUNNEL RELEASE;  Surgeon: Daryll Brod, MD;  Location: [place];  Service: Orthopedics;  Laterality: Right;  . CATARACT EXTRACTION W/PHACO  05/28/2012   Procedure: CATARACT EXTRACTION PHACO AND INTRAOCULAR LENS PLACEMENT (IOC);  Surgeon: Tonny Branch, MD;  [facility];  Service: Ophthalmology;  Laterality: Right;  CDE=12.84  . CATARACT EXTRACTION W/PHACO  06/07/2012   Procedure: CATARACT EXTRACTION PHACO AND INTRAOCULAR LENS PLACEMENT (IOC);  Surgeon: Tonny Branch, MD;  [facility];  Service: Ophthalmology;   Laterality: Left;  CDE 17.60  . COLONOSCOPY    . COLONOSCOPY N/A 07/08/2015   Procedure: COLONOSCOPY;  Surgeon: Rogene Houston, MD;  [facility];  Service: Endoscopy;  Laterality: N/A;  930  . ESOPHAGEAL DILATION N/A 07/08/2015   Procedure: ESOPHAGEAL DILATION;  Surgeon: Rogene Houston, MD;  [facility];  Service: Endoscopy;  Laterality: N/A;  . ESOPHAGOGASTRODUODENOSCOPY N/A 07/08/2015   Procedure: ESOPHAGOGASTRODUODENOSCOPY (EGD);  Surgeon: Rogene Houston, MD;  [facility];  Service: Endoscopy;  Laterality: N/A;  . Hernia     Right inguinal  . HERNIA REPAIR  1961   right inguinal hernia  . UPPER GASTROINTESTINAL ENDOSCOPY      There were no vitals filed for this visit.  Subjective Assessment - 02/19/19 1132    Subjective  COVID-19 screen performed prior to patient entering clinic. Patient reports she noticed more pain with prolonged sitting recently but also has it with prolonged standing and activity.    Pertinent History  Scoliosis, hiatial hernia, osteopenia, allergy to tape    Limitations  Sitting;House hold activities;Standing;Walking    How long can you stand comfortably?  20 minutes    How long can you walk comfortably?  20-30 minutes max    Diagnostic tests  x-ray    Patient Stated Goals  improve ability to perform home activities with less  pain    Currently in Pain?  Other (Comment)   No pain assessment provided by patient        [facility] PT Assessment - 02/19/19 0001      Assessment   Medical Diagnosis  spondylosis without myelopathy or radiculopathy    Referring Provider (PT)  Beau Fanny, PA-C    Prior Therapy  yes      Precautions   Precautions  None      Restrictions   Weight Bearing Restrictions  No                   [facility] Adult PT Treatment/Exercise - 02/19/19 0001      Modalities   Modalities  Electrical Stimulation;Moist Heat;Traction      Moist Heat Therapy   Number Minutes Moist Heat  10 Minutes     Moist Heat Location  Lumbar Spine      Electrical Stimulation   Electrical Stimulation Location  R low back    Electrical Stimulation Action  Pre-Mod    Electrical Stimulation Parameters  80-150 hz x10 min    Electrical Stimulation Goals  Pain      Traction   Type of Traction  Lumbar    Min (lbs)  5    Max (lbs)  80    Hold Time  99    Rest Time  5    Time  15      Manual Therapy   Manual Therapy  Soft tissue mobilization    Soft tissue mobilization  STW/M to lumbar paraspinals/glutes with emphasis on right side to release the QL  with Pt prone                  PT Long Term Goals - 10/30/18 1121      PT LONG TERM GOAL #1   Title  Patient will be independent with HEP    Time  6    Period  Weeks    Status  Achieved      PT LONG TERM GOAL #2   Title  Patient will report ability to perfrom ADLs with low back pain less than or equal to 4/10    Time  6    Period  Weeks    Status  On-going   slow to perform     PT LONG TERM GOAL #3   Title  Patient will demonstrate 4/5 or greater bilateral LE MMT to improve stability during functional tasks.    Time  6    Period  Weeks    Status  On-going      PT LONG TERM GOAL #4   Title  Patient will report ability to stand for 20 minutes or greater with low back pain less than or equal to 5/10 to improve ability to perform home tasks.     Time  6    Period  Weeks    Status  Partially Met   varies on the day but overall can complete           Plan - 02/19/19 1206    Clinical Impression Statement  Patient presented in clinic with more complaints of LBP with prolonged sitting.  Patient presented with greater muscle tightness in R glute max and med today. Normal modalities response noted following removal of the modalities.    Stability/Clinical Decision Making  Evolving/Moderate complexity    Rehab Potential  Good    PT Frequency  2x / week  PT Duration  6 weeks    PT Treatment/Interventions  Electrical  Stimulation;ADLs/Self Care Home Management;Cryotherapy;Traction;Ultrasound;Moist Heat;Iontophoresis 12m/ml Dexamethasone;Gait training;Stair training;Manual techniques;Dry needling;Passive range of motion;Balance training;Neuromuscular re-education;Therapeutic exercise;Therapeutic activities;Patient/family education    PT Next Visit Plan  cont STW/M to right low back/ Traction    PT Home Exercise Plan  see patient education section    Consulted and Agree with Plan of Care  Patient       Patient will benefit from skilled therapeutic intervention in order to improve the following deficits and impairments:  Abnormal gait, Pain, Postural dysfunction, Decreased activity tolerance, Decreased range of motion, Decreased strength  Visit Diagnosis: Abnormal posture  Chronic bilateral low back pain without sciatica  Muscle weakness (generalized)     Problem List Patient Active Problem List   Diagnosis Date Noted  . SVT (supraventricular tachycardia) ([place] 08/28/2018  . Elevated troponin 08/28/2018  . Light headedness 05/09/2018  . Osteopenia of the elderly 02/19/2014  . DDD (degenerative disc disease), lumbar 12/03/2013  . Scoliosis 12/03/2013  . Chronic LBP 05/14/2013  . IBS (irritable bowel syndrome) 11/08/2012  . UC (ulcerative colitis) ([place] 04/23/2012  . Palpitation 10/26/2011  . Hypertension 10/26/2011  . Chronic ulcerative colitis ([place] 03/23/2011    KStandley Brooking PTA 02/19/2019, 12:17 PM  [facility] [address] [address]Phone: [phone]  Fax:  [phone] Name: BDeclan AdamsonMRN: [id number] of Birth: 806-14-1937

## 2019-02-21 ENCOUNTER — Encounter (INDEPENDENT_AMBULATORY_CARE_PROVIDER_SITE_OTHER): Payer: Self-pay

## 2019-02-22 ENCOUNTER — Encounter (INDEPENDENT_AMBULATORY_CARE_PROVIDER_SITE_OTHER): Payer: Self-pay

## 2019-02-26 ENCOUNTER — Other Ambulatory Visit: Payer: Self-pay

## 2019-02-26 ENCOUNTER — Ambulatory Visit: Payer: Medicare Other | Admitting: Physical Therapy

## 2019-02-26 ENCOUNTER — Encounter: Payer: Self-pay | Admitting: Physical Therapy

## 2019-02-26 DIAGNOSIS — M545 Low back pain: Secondary | ICD-10-CM | POA: Diagnosis not present

## 2019-02-26 DIAGNOSIS — R293 Abnormal posture: Secondary | ICD-10-CM

## 2019-02-26 DIAGNOSIS — G8929 Other chronic pain: Secondary | ICD-10-CM | POA: Diagnosis not present

## 2019-02-26 DIAGNOSIS — M6281 Muscle weakness (generalized): Secondary | ICD-10-CM | POA: Diagnosis not present

## 2019-02-26 NOTE — Therapy (Signed)
[facility] [place], [place], [address] Phone: [phone]   Fax:  [phone]  Physical Therapy Treatment  Patient Details  Name: Lori Soto MRN: [id number] Date of Birth: 1935/11/25 Referring Provider (PT): Beau Fanny, [place]   Encounter Date: 02/26/2019  PT End of Session - 02/26/19 1129    Visit Number  27    Number of Visits  28    Date for PT Re-Evaluation  03/08/19    PT Start Time  1120    PT Stop Time  1205    PT Time Calculation (min)  45 min    Activity Tolerance  Patient tolerated treatment well    Behavior During Therapy  [facility] for tasks assessed/performed       Past Medical History:  Diagnosis Date  . Anemia   . Arthritis   . Cancer ([place])    skin cancer on nose  . Carpal tunnel syndrome, bilateral   . Cataract   . Esophagitis   . GERD (gastroesophageal reflux disease)   . H/O hiatal hernia   . Osteopenia   . Scoliosis   . Shingles   . SVT (supraventricular tachycardia) ([place])   . Ulcerative colitis     Past Surgical History:  Procedure Laterality Date  . ABDOMINAL HYSTERECTOMY    . BUNIONECTOMY     Bilateral  . CARPAL TUNNEL RELEASE Left 03/10/2015   Procedure: LEFT CARPAL TUNNEL RELEASE;  Surgeon: Daryll Brod, MD;  Location: [place];  Service: Orthopedics;  Laterality: Left;  . CARPAL TUNNEL RELEASE Right 10/31/2017   Procedure: RIGHT CARPAL TUNNEL RELEASE;  Surgeon: Daryll Brod, MD;  Location: [place];  Service: Orthopedics;  Laterality: Right;  . CATARACT EXTRACTION W/PHACO  05/28/2012   Procedure: CATARACT EXTRACTION PHACO AND INTRAOCULAR LENS PLACEMENT (IOC);  Surgeon: Tonny Branch, MD;  [facility];  Service: Ophthalmology;  Laterality: Right;  CDE=12.84  . CATARACT EXTRACTION W/PHACO  06/07/2012   Procedure: CATARACT EXTRACTION PHACO AND INTRAOCULAR LENS PLACEMENT (IOC);  Surgeon: Tonny Branch, MD;  [facility];  Service: Ophthalmology;   Laterality: Left;  CDE 17.60  . COLONOSCOPY    . COLONOSCOPY N/A 07/08/2015   Procedure: COLONOSCOPY;  Surgeon: Rogene Houston, MD;  [facility];  Service: Endoscopy;  Laterality: N/A;  930  . ESOPHAGEAL DILATION N/A 07/08/2015   Procedure: ESOPHAGEAL DILATION;  Surgeon: Rogene Houston, MD;  [facility];  Service: Endoscopy;  Laterality: N/A;  . ESOPHAGOGASTRODUODENOSCOPY N/A 07/08/2015   Procedure: ESOPHAGOGASTRODUODENOSCOPY (EGD);  Surgeon: Rogene Houston, MD;  [facility];  Service: Endoscopy;  Laterality: N/A;  . Hernia     Right inguinal  . HERNIA REPAIR  1961   right inguinal hernia  . UPPER GASTROINTESTINAL ENDOSCOPY      There were no vitals filed for this visit.  Subjective Assessment - 02/26/19 1128    Subjective  COVID 19 screening performed on patient upon arrival. Patient reports yesterday she was cleaning out boxes and had more R LBP.    Pertinent History  Scoliosis, hiatial hernia, osteopenia, allergy to tape    How long can you stand comfortably?  20 minutes    Diagnostic tests  x-ray    Patient Stated Goals  improve ability to perform home activities with less pain    Currently in Pain?  Yes    Pain Location  Back    Pain Orientation  Right;Lower    Pain  Descriptors / Indicators  Discomfort    Pain Type  Chronic pain    Pain Onset  More than a month ago    Pain Frequency  Constant                       [facility] Adult PT Treatment/Exercise - 02/26/19 0001      Modalities   Modalities  Electrical Stimulation;Moist Heat;Traction      Moist Heat Therapy   Number Minutes Moist Heat  10 Minutes    Moist Heat Location  Lumbar Spine      Electrical Stimulation   Electrical Stimulation Location  R low back    Electrical Stimulation Action  Pre-Mod    Electrical Stimulation Parameters  80-150 hz x10 min    Electrical Stimulation Goals  Pain      Traction   Type of Traction  Lumbar    Min (lbs)  5    Max (lbs)  80     Hold Time  99    Rest Time  5    Time  15      Manual Therapy   Manual Therapy  Soft tissue mobilization    Soft tissue mobilization  STW/M to R lumbar paraspinals/glutes with emphasis on right side to release the QL  with Pt prone                  PT Long Term Goals - 10/30/18 1121      PT LONG TERM GOAL #1   Title  Patient will be independent with HEP    Time  6    Period  Weeks    Status  Achieved      PT LONG TERM GOAL #2   Title  Patient will report ability to perfrom ADLs with low back pain less than or equal to 4/10    Time  6    Period  Weeks    Status  On-going   slow to perform     PT LONG TERM GOAL #3   Title  Patient will demonstrate 4/5 or greater bilateral LE MMT to improve stability during functional tasks.    Time  6    Period  Weeks    Status  On-going      PT LONG TERM GOAL #4   Title  Patient will report ability to stand for 20 minutes or greater with low back pain less than or equal to 5/10 to improve ability to perform home tasks.     Time  6    Period  Weeks    Status  Partially Met   varies on the day but overall can complete           Plan - 02/26/19 1158    Clinical Impression Statement  Patient presented in clinic with greater pain secondary to bending over to clean out boxes. Patient experienced more pain and tenderness over R SI joint and R glute. Tenderness also reported in R lumbar paraspinals/QL region as well. Normal modalities response noted following removal of the modalities.    Stability/Clinical Decision Making  Evolving/Moderate complexity    Rehab Potential  Good    PT Frequency  2x / week    PT Duration  6 weeks    PT Treatment/Interventions  Electrical Stimulation;ADLs/Self Care Home Management;Cryotherapy;Traction;Ultrasound;Moist Heat;Iontophoresis 39m/ml Dexamethasone;Gait training;Stair training;Manual techniques;Dry needling;Passive range of motion;Balance training;Neuromuscular re-education;Therapeutic  exercise;Therapeutic activities;Patient/family education    PT Next Visit Plan  cont STW/M to right low back/ Traction    Consulted and Agree with Plan of Care  Patient       Patient will benefit from skilled therapeutic intervention in order to improve the following deficits and impairments:  Abnormal gait, Pain, Postural dysfunction, Decreased activity tolerance, Decreased range of motion, Decreased strength  Visit Diagnosis: Abnormal posture  Chronic bilateral low back pain without sciatica     Problem List Patient Active Problem List   Diagnosis Date Noted  . SVT (supraventricular tachycardia) ([place]) 08/28/2018  . Elevated troponin 08/28/2018  . Light headedness 05/09/2018  . Osteopenia of the elderly 02/19/2014  . DDD (degenerative disc disease), lumbar 12/03/2013  . Scoliosis 12/03/2013  . Chronic LBP 05/14/2013  . IBS (irritable bowel syndrome) 11/08/2012  . UC (ulcerative colitis) ([place]) 04/23/2012  . Palpitation 10/26/2011  . Hypertension 10/26/2011  . Chronic ulcerative colitis ([place]) 03/23/2011    Standley Brooking, PTA 02/26/2019, 12:14 PM  [facility] [address] [place], [place], [address] Phone: [phone]   Fax:  [phone]  Name: Lori Soto MRN: [id number] Date of Birth: 1935/06/03

## 2019-02-28 ENCOUNTER — Ambulatory Visit (INDEPENDENT_AMBULATORY_CARE_PROVIDER_SITE_OTHER): Payer: Medicare Other | Admitting: Nurse Practitioner

## 2019-03-05 ENCOUNTER — Ambulatory Visit: Payer: Medicare Other | Attending: Orthopaedic Surgery | Admitting: Physical Therapy

## 2019-03-05 ENCOUNTER — Other Ambulatory Visit: Payer: Self-pay

## 2019-03-05 DIAGNOSIS — M545 Low back pain: Secondary | ICD-10-CM | POA: Insufficient documentation

## 2019-03-05 DIAGNOSIS — M546 Pain in thoracic spine: Secondary | ICD-10-CM | POA: Insufficient documentation

## 2019-03-05 DIAGNOSIS — R293 Abnormal posture: Secondary | ICD-10-CM | POA: Diagnosis not present

## 2019-03-05 DIAGNOSIS — M6281 Muscle weakness (generalized): Secondary | ICD-10-CM | POA: Insufficient documentation

## 2019-03-05 DIAGNOSIS — G8929 Other chronic pain: Secondary | ICD-10-CM | POA: Diagnosis not present

## 2019-03-05 NOTE — Therapy (Signed)
[facility] [place], [place], [address] Phone: [phone]   Fax:  [phone]  Physical Therapy Treatment  Patient Details  Name: Lori Soto MRN: [id number] Date of Birth: [birth year] Referring Provider (PT): Beau Fanny, [place]   Encounter Date: 03/05/2019  PT End of Session - 03/05/19 1606    Visit Number  28    Number of Visits  34    Date for PT Re-Evaluation  04/12/19    PT Start Time  8828    PT Stop Time  0414    PT Time Calculation (min)  57 min       Past Medical History:  Diagnosis Date  . Anemia   . Arthritis   . Cancer ([place])    skin cancer on nose  . Carpal tunnel syndrome, bilateral   . Cataract   . Esophagitis   . GERD (gastroesophageal reflux disease)   . H/O hiatal hernia   . Osteopenia   . Scoliosis   . Shingles   . SVT (supraventricular tachycardia) ([place])   . Ulcerative colitis     Past Surgical History:  Procedure Laterality Date  . ABDOMINAL HYSTERECTOMY    . BUNIONECTOMY     Bilateral  . CARPAL TUNNEL RELEASE Left 03/10/2015   Procedure: LEFT CARPAL TUNNEL RELEASE;  Surgeon: Daryll Brod, MD;  Location: [facility];  Service: Orthopedics;  Laterality: Left;  . CARPAL TUNNEL RELEASE Right 10/31/2017   Procedure: RIGHT CARPAL TUNNEL RELEASE;  Surgeon: Daryll Brod, MD;  Location: [facility];  Service: Orthopedics;  Laterality: Right;  . CATARACT EXTRACTION W/PHACO  05/28/2012   Procedure: CATARACT EXTRACTION PHACO AND INTRAOCULAR LENS PLACEMENT (IOC);  Surgeon: Tonny Branch, MD;  [facility];  Service: Ophthalmology;  Laterality: Right;  CDE=12.84  . CATARACT EXTRACTION W/PHACO  06/07/2012   Procedure: CATARACT EXTRACTION PHACO AND INTRAOCULAR LENS PLACEMENT (IOC);  Surgeon: Tonny Branch, MD;  [facility];  Service: Ophthalmology;  Laterality: Left;  CDE 17.60  . COLONOSCOPY    . COLONOSCOPY N/A 07/08/2015   Procedure: COLONOSCOPY;  Surgeon: Rogene Houston, MD;  [facility];  Service: Endoscopy;  Laterality: N/A;  930  . ESOPHAGEAL DILATION N/A 07/08/2015   Procedure: ESOPHAGEAL DILATION;  Surgeon: Rogene Houston, MD;  [facility];  Service: Endoscopy;  Laterality: N/A;  . ESOPHAGOGASTRODUODENOSCOPY N/A 07/08/2015   Procedure: ESOPHAGOGASTRODUODENOSCOPY (EGD);  Surgeon: Rogene Houston, MD;  [facility];  Service: Endoscopy;  Laterality: N/A;  . Hernia     Right inguinal  . HERNIA REPAIR  1961   right inguinal hernia  . UPPER GASTROINTESTINAL ENDOSCOPY      There were no vitals filed for this visit.  Subjective Assessment - 03/05/19 1548    Subjective  COVID-19 screen performed prior to patient entering clinic.  No new complaints.    Pertinent History  Scoliosis, hiatial hernia, osteopenia, allergy to tape    Limitations  Sitting;House hold activities;Standing;Walking    How long can you stand comfortably?  20 minutes    How long can you walk comfortably?  20-30 minutes max    Diagnostic tests  x-ray    Patient Stated Goals  improve ability to perform home activities with less pain    Currently in Pain?  Yes    Pain Score  8     Pain Location  Back    Pain Orientation  Right;Lower    Pain Descriptors /  Indicators  Discomfort    Pain Type  Chronic pain    Pain Onset  More than a month ago                       [facility] Adult PT Treatment/Exercise - 03/05/19 0001      Moist Heat Therapy   Number Minutes Moist Heat  15 Minutes    Moist Heat Location  Lumbar Spine      Electrical Stimulation   Electrical Stimulation Location  RT LB    Electrical Stimulation Action  Pre-mod.    Electrical Stimulation Parameters  80-150 Hz x 15 minutes.    Electrical Stimulation Goals  Pain      Traction   Type of Traction  Lumbar    Min (lbs)  5    Max (lbs)  80    Hold Time  99    Rest Time  5    Time  15      Manual Therapy   Manual Therapy  Soft tissue mobilization    Soft tissue  mobilization  STW/M x 10 minutes to right QL region.                  PT Long Term Goals - 10/30/18 1121      PT LONG TERM GOAL #1   Title  Patient will be independent with HEP    Time  6    Period  Weeks    Status  Achieved      PT LONG TERM GOAL #2   Title  Patient will report ability to perfrom ADLs with low back pain less than or equal to 4/10    Time  6    Period  Weeks    Status  On-going   slow to perform     PT LONG TERM GOAL #3   Title  Patient will demonstrate 4/5 or greater bilateral LE MMT to improve stability during functional tasks.    Time  6    Period  Weeks    Status  On-going      PT LONG TERM GOAL #4   Title  Patient will report ability to stand for 20 minutes or greater with low back pain less than or equal to 5/10 to improve ability to perform home tasks.     Time  6    Period  Weeks    Status  Partially Met   varies on the day but overall can complete           Plan - 03/05/19 1604    Clinical Impression Statement  Patient did well with treatment today.  Continued pain in right QL region.    Stability/Clinical Decision Making  Evolving/Moderate complexity    Rehab Potential  Good    PT Frequency  2x / week    PT Duration  6 weeks    PT Treatment/Interventions  Electrical Stimulation;ADLs/Self Care Home Management;Cryotherapy;Traction;Ultrasound;Moist Heat;Iontophoresis 88m/ml Dexamethasone;Gait training;Stair training;Manual techniques;Dry needling;Passive range of motion;Balance training;Neuromuscular re-education;Therapeutic exercise;Therapeutic activities;Patient/family education    PT Next Visit Plan  cont STW/M to right low back/ Traction    PT Home Exercise Plan  see patient education section    Consulted and Agree with Plan of Care  Patient       Patient will benefit from skilled therapeutic intervention in order to improve the following deficits and impairments:     Visit Diagnosis: Abnormal posture  Chronic bilateral  low back  pain without sciatica     Problem List Patient Active Problem List   Diagnosis Date Noted  . SVT (supraventricular tachycardia) ([place]) 08/28/2018  . Elevated troponin 08/28/2018  . Light headedness 05/09/2018  . Osteopenia of the elderly 02/19/2014  . DDD (degenerative disc disease), lumbar 12/03/2013  . Scoliosis 12/03/2013  . Chronic LBP 05/14/2013  . IBS (irritable bowel syndrome) 11/08/2012  . UC (ulcerative colitis) ([place]) 04/23/2012  . Palpitation 10/26/2011  . Hypertension 10/26/2011  . Chronic ulcerative colitis ([place]) 03/23/2011    Derrian Poli, [place] MPT 03/05/2019, 4:33 PM  [facility] [address] [place], [place], [address] Phone: [phone]   Fax:  [phone]  Name: Makensey Rego MRN: [id number] Date of Birth: [birth year]

## 2019-03-12 ENCOUNTER — Encounter: Payer: Self-pay | Admitting: Physical Therapy

## 2019-03-12 ENCOUNTER — Other Ambulatory Visit: Payer: Self-pay

## 2019-03-12 ENCOUNTER — Ambulatory Visit: Payer: Medicare Other | Admitting: Physical Therapy

## 2019-03-12 DIAGNOSIS — M545 Low back pain, unspecified: Secondary | ICD-10-CM

## 2019-03-12 DIAGNOSIS — M6281 Muscle weakness (generalized): Secondary | ICD-10-CM | POA: Diagnosis not present

## 2019-03-12 DIAGNOSIS — R293 Abnormal posture: Secondary | ICD-10-CM | POA: Diagnosis not present

## 2019-03-12 DIAGNOSIS — G8929 Other chronic pain: Secondary | ICD-10-CM | POA: Diagnosis not present

## 2019-03-12 DIAGNOSIS — M546 Pain in thoracic spine: Secondary | ICD-10-CM | POA: Diagnosis not present

## 2019-03-12 NOTE — Therapy (Signed)
[facility] [place], [place], [address] Phone: [phone]   Fax:  [phone]  Physical Therapy Treatment  Patient Details  Name: Lori Soto MRN: [id number] Date of Birth: 1935-09-08 Referring Provider (PT): Beau Fanny, [place]   Encounter Date: 03/12/2019  PT End of Session - 03/12/19 1117    Visit Number  29    Number of Visits  34    Date for PT Re-Evaluation  04/12/19    PT Start Time  1121    PT Stop Time  1211    PT Time Calculation (min)  50 min    Activity Tolerance  Patient tolerated treatment well    Behavior During Therapy  [facility] for tasks assessed/performed       Past Medical History:  Diagnosis Date  . Anemia   . Arthritis   . Cancer ([place])    skin cancer on nose  . Carpal tunnel syndrome, bilateral   . Cataract   . Esophagitis   . GERD (gastroesophageal reflux disease)   . H/O hiatal hernia   . Osteopenia   . Scoliosis   . Shingles   . SVT (supraventricular tachycardia) ([place])   . Ulcerative colitis     Past Surgical History:  Procedure Laterality Date  . ABDOMINAL HYSTERECTOMY    . BUNIONECTOMY     Bilateral  . CARPAL TUNNEL RELEASE Left 03/10/2015   Procedure: LEFT CARPAL TUNNEL RELEASE;  Surgeon: Daryll Brod, MD;  Location: [facility];  Service: Orthopedics;  Laterality: Left;  . CARPAL TUNNEL RELEASE Right 10/31/2017   Procedure: RIGHT CARPAL TUNNEL RELEASE;  Surgeon: Daryll Brod, MD;  Location: [facility];  Service: Orthopedics;  Laterality: Right;  . CATARACT EXTRACTION W/PHACO  05/28/2012   Procedure: CATARACT EXTRACTION PHACO AND INTRAOCULAR LENS PLACEMENT (IOC);  Surgeon: Tonny Branch, MD;  [facility];  Service: Ophthalmology;  Laterality: Right;  CDE=12.84  . CATARACT EXTRACTION W/PHACO  06/07/2012   Procedure: CATARACT EXTRACTION PHACO AND INTRAOCULAR LENS PLACEMENT (IOC);  Surgeon: Tonny Branch, MD;  [facility];  Service: Ophthalmology;   Laterality: Left;  CDE 17.60  . COLONOSCOPY    . COLONOSCOPY N/A 07/08/2015   Procedure: COLONOSCOPY;  Surgeon: Rogene Houston, MD;  [facility];  Service: Endoscopy;  Laterality: N/A;  930  . ESOPHAGEAL DILATION N/A 07/08/2015   Procedure: ESOPHAGEAL DILATION;  Surgeon: Rogene Houston, MD;  [facility];  Service: Endoscopy;  Laterality: N/A;  . ESOPHAGOGASTRODUODENOSCOPY N/A 07/08/2015   Procedure: ESOPHAGOGASTRODUODENOSCOPY (EGD);  Surgeon: Rogene Houston, MD;  [facility];  Service: Endoscopy;  Laterality: N/A;  . Hernia     Right inguinal  . HERNIA REPAIR  1961   right inguinal hernia  . UPPER GASTROINTESTINAL ENDOSCOPY      There were no vitals filed for this visit.  Subjective Assessment - 03/12/19 1117    Subjective  COVID 19 screening performed on patient upon arrival. Patient reports since last treatment being able to do more yardwork, work in her building more as well as stand longer for cooking. Gages her improvement based on what new activities or progression of activitivies she is able to do compared to pain.    Pertinent History  Scoliosis, hiatial hernia, osteopenia, allergy to tape    Limitations  Sitting;House hold activities;Standing;Walking    How long can you stand comfortably?  20 minutes    How long can you walk comfortably?  20-30  minutes max    Diagnostic tests  x-ray    Patient Stated Goals  improve ability to perform home activities with less pain    Currently in Pain?  Other (Comment)   No pain assessment provided by patient        [facility] PT Assessment - 03/12/19 0001      Assessment   Medical Diagnosis  spondylosis without myelopathy or radiculopathy    Referring Provider (PT)  Beau Fanny, PA-C    Prior Therapy  yes      Precautions   Precautions  None      Restrictions   Weight Bearing Restrictions  No                   [facility] Adult PT Treatment/Exercise - 03/12/19 0001      Modalities    Modalities  Electrical Stimulation;Moist Heat;Traction      Moist Heat Therapy   Number Minutes Moist Heat  15 Minutes    Moist Heat Location  Lumbar Spine      Electrical Stimulation   Electrical Stimulation Location  B low back    Electrical Stimulation Action  IFC    Electrical Stimulation Parameters  80-150 hz x15 min    Electrical Stimulation Goals  Pain      Traction   Type of Traction  Lumbar    Min (lbs)  5    Max (lbs)  80    Hold Time  99    Rest Time  5    Time  15      Manual Therapy   Manual Therapy  Soft tissue mobilization    Soft tissue mobilization  STW to R SI joint, superior glute, QL in prone to reduce soreness and muscle tightness                  PT Long Term Goals - 10/30/18 1121      PT LONG TERM GOAL #1   Title  Patient will be independent with HEP    Time  6    Period  Weeks    Status  Achieved      PT LONG TERM GOAL #2   Title  Patient will report ability to perfrom ADLs with low back pain less than or equal to 4/10    Time  6    Period  Weeks    Status  On-going   slow to perform     PT LONG TERM GOAL #3   Title  Patient will demonstrate 4/5 or greater bilateral LE MMT to improve stability during functional tasks.    Time  6    Period  Weeks    Status  On-going      PT LONG TERM GOAL #4   Title  Patient will report ability to stand for 20 minutes or greater with low back pain less than or equal to 5/10 to improve ability to perform home tasks.     Time  6    Period  Weeks    Status  Partially Met   varies on the day but overall can complete           Plan - 03/12/19 1201    Clinical Impression Statement  Patient presented in clinic with reports of being able to be more active and stand for longer periods of time since last treatment. Patient gages her improvement based on her activity levels compared to pain levels. Patient still very tender  of R SI joint and superior glute region. Normal modalities response noted  following removal of the modalities.    Stability/Clinical Decision Making  Evolving/Moderate complexity    Rehab Potential  Good    PT Frequency  2x / week    PT Duration  6 weeks    PT Treatment/Interventions  Electrical Stimulation;ADLs/Self Care Home Management;Cryotherapy;Traction;Ultrasound;Moist Heat;Iontophoresis 49m/ml Dexamethasone;Gait training;Stair training;Manual techniques;Dry needling;Passive range of motion;Balance training;Neuromuscular re-education;Therapeutic exercise;Therapeutic activities;Patient/family education    PT Next Visit Plan  cont STW/M to right low back/ Traction    PT Home Exercise Plan  see patient education section    Consulted and Agree with Plan of Care  Patient       Patient will benefit from skilled therapeutic intervention in order to improve the following deficits and impairments:  Abnormal gait, Pain, Postural dysfunction, Decreased activity tolerance, Decreased range of motion, Decreased strength  Visit Diagnosis: Abnormal posture  Chronic bilateral low back pain without sciatica     Problem List Patient Active Problem List   Diagnosis Date Noted  . SVT (supraventricular tachycardia) ([place] 08/28/2018  . Elevated troponin 08/28/2018  . Light headedness 05/09/2018  . Osteopenia of the elderly 02/19/2014  . DDD (degenerative disc disease), lumbar 12/03/2013  . Scoliosis 12/03/2013  . Chronic LBP 05/14/2013  . IBS (irritable bowel syndrome) 11/08/2012  . UC (ulcerative colitis) ([place] 04/23/2012  . Palpitation 10/26/2011  . Hypertension 10/26/2011  . Chronic ulcerative colitis ([place] 03/23/2011    KStandley Brooking PTA 03/12/2019, 12:15 PM  [facility] [address] [address]Phone: [phone]  Fax:  [phone] Name: Lori CabezaMRN: [id number] of Birth: 825-Feb-1937

## 2019-03-19 ENCOUNTER — Other Ambulatory Visit: Payer: Self-pay

## 2019-03-19 ENCOUNTER — Ambulatory Visit: Payer: Medicare Other | Admitting: *Deleted

## 2019-03-19 DIAGNOSIS — R293 Abnormal posture: Secondary | ICD-10-CM | POA: Diagnosis not present

## 2019-03-19 DIAGNOSIS — M6281 Muscle weakness (generalized): Secondary | ICD-10-CM | POA: Diagnosis not present

## 2019-03-19 DIAGNOSIS — G8929 Other chronic pain: Secondary | ICD-10-CM

## 2019-03-19 DIAGNOSIS — M546 Pain in thoracic spine: Secondary | ICD-10-CM | POA: Diagnosis not present

## 2019-03-19 DIAGNOSIS — M545 Low back pain: Secondary | ICD-10-CM | POA: Diagnosis not present

## 2019-03-19 NOTE — Therapy (Signed)
[facility] [place], [place], [address] Phone: [phone]   Fax:  [phone]  Physical Therapy Treatment  Patient Details  Name: Lori Soto MRN: [id number] Date of Birth: [birth year] Referring Provider (PT): Beau Fanny, [place]   Encounter Date: 03/19/2019  PT End of Session - 03/19/19 1150    Visit Number  30    Number of Visits  34    Date for PT Re-Evaluation  04/12/19    PT Start Time  1122    PT Stop Time  1210    PT Time Calculation (min)  48 min       Past Medical History:  Diagnosis Date  . Anemia   . Arthritis   . Cancer ([place])    skin cancer on nose  . Carpal tunnel syndrome, bilateral   . Cataract   . Esophagitis   . GERD (gastroesophageal reflux disease)   . H/O hiatal hernia   . Osteopenia   . Scoliosis   . Shingles   . SVT (supraventricular tachycardia) ([place])   . Ulcerative colitis     Past Surgical History:  Procedure Laterality Date  . ABDOMINAL HYSTERECTOMY    . BUNIONECTOMY     Bilateral  . CARPAL TUNNEL RELEASE Left 03/10/2015   Procedure: LEFT CARPAL TUNNEL RELEASE;  Surgeon: Daryll Brod, MD;  [facility];  Service: Orthopedics;  Laterality: Left;  . CARPAL TUNNEL RELEASE Right 10/31/2017   Procedure: RIGHT CARPAL TUNNEL RELEASE;  Surgeon: Daryll Brod, MD;  Location: [facility];  Service: Orthopedics;  Laterality: Right;  . CATARACT EXTRACTION W/PHACO  05/28/2012   Procedure: CATARACT EXTRACTION PHACO AND INTRAOCULAR LENS PLACEMENT (IOC);  Surgeon: Tonny Branch, MD;  [facility];  Service: Ophthalmology;  Laterality: Right;  CDE=12.84  . CATARACT EXTRACTION W/PHACO  06/07/2012   Procedure: CATARACT EXTRACTION PHACO AND INTRAOCULAR LENS PLACEMENT (IOC);  Surgeon: Tonny Branch, MD;  [facility];  Service: Ophthalmology;  Laterality: Left;  CDE 17.60  . COLONOSCOPY    . COLONOSCOPY N/A 07/08/2015   Procedure: COLONOSCOPY;  Surgeon: Rogene Houston, MD;  [facility];  Service: Endoscopy;  Laterality: N/A;  930  . ESOPHAGEAL DILATION N/A 07/08/2015   Procedure: ESOPHAGEAL DILATION;  Surgeon: Rogene Houston, MD;  [facility];  Service: Endoscopy;  Laterality: N/A;  . ESOPHAGOGASTRODUODENOSCOPY N/A 07/08/2015   Procedure: ESOPHAGOGASTRODUODENOSCOPY (EGD);  Surgeon: Rogene Houston, MD;  [facility];  Service: Endoscopy;  Laterality: N/A;  . Hernia     Right inguinal  . HERNIA REPAIR  1961   right inguinal hernia  . UPPER GASTROINTESTINAL ENDOSCOPY      There were no vitals filed for this visit.                    [facility] Adult PT Treatment/Exercise - 03/19/19 0001      Modalities   Modalities  Electrical Stimulation;Moist Heat;Traction      Moist Heat Therapy   Number Minutes Moist Heat  15 Minutes    Moist Heat Location  Lumbar Spine      Electrical Stimulation   Electrical Stimulation Location  B low back    Electrical Stimulation Action  IFC    Electrical Stimulation Parameters  80-150hz  x 15 mins    Electrical Stimulation Goals  Pain      Traction   Type of Traction  Lumbar    Min (lbs)  5    Max (lbs)  80    Hold Time  99    Rest Time  5    Time  15      Manual Therapy   Manual Therapy  Soft tissue mobilization    Soft tissue mobilization  STW to R SI joint, superior glute, QL in prone to reduce soreness and muscle tightness                  PT Long Term Goals - 10/30/18 1121      PT LONG TERM GOAL #1   Title  Patient will be independent with HEP    Time  6    Period  Weeks    Status  Achieved      PT LONG TERM GOAL #2   Title  Patient will report ability to perfrom ADLs with low back pain less than or equal to 4/10    Time  6    Period  Weeks    Status  On-going   slow to perform     PT LONG TERM GOAL #3   Title  Patient will demonstrate 4/5 or greater bilateral LE MMT to improve stability during functional tasks.    Time  6     Period  Weeks    Status  On-going      PT LONG TERM GOAL #4   Title  Patient will report ability to stand for 20 minutes or greater with low back pain less than or equal to 5/10 to improve ability to perform home tasks.     Time  6    Period  Weeks    Status  Partially Met   varies on the day but overall can complete           Plan - 03/19/19 1150    Clinical Impression Statement  Pt arrived today doing fairly well and being able to do more at home now. She had notable soreness in both QLs during STW. Traction performed at 80#s again today and tolerated well.    Stability/Clinical Decision Making  Evolving/Moderate complexity    Rehab Potential  Good    PT Frequency  2x / week    PT Duration  6 weeks    PT Treatment/Interventions  Electrical Stimulation;ADLs/Self Care Home Management;Cryotherapy;Traction;Ultrasound;Moist Heat;Iontophoresis 51m/ml Dexamethasone;Gait training;Stair training;Manual techniques;Dry needling;Passive range of motion;Balance training;Neuromuscular re-education;Therapeutic exercise;Therapeutic activities;Patient/family education    PT Next Visit Plan  cont STW/M to right low back/ Traction    PT Home Exercise Plan  see patient education section    Consulted and Agree with Plan of Care  Patient       Patient will benefit from skilled therapeutic intervention in order to improve the following deficits and impairments:  Abnormal gait, Pain, Postural dysfunction, Decreased activity tolerance, Decreased range of motion, Decreased strength  Visit Diagnosis: Abnormal posture  Chronic bilateral low back pain without sciatica  Muscle weakness (generalized)  Pain in thoracic spine  Chronic right-sided low back pain without sciatica     Problem List Patient Active Problem List   Diagnosis Date Noted  . SVT (supraventricular tachycardia) ([place] 08/28/2018  . Elevated troponin 08/28/2018  . Light headedness 05/09/2018  . Osteopenia of the elderly  02/19/2014  . DDD (degenerative disc disease), lumbar 12/03/2013  . Scoliosis 12/03/2013  . Chronic LBP 05/14/2013  . IBS (irritable bowel syndrome) 11/08/2012  . UC (ulcerative colitis) ([place] 04/23/2012  . Palpitation 10/26/2011  . Hypertension 10/26/2011  .  Chronic ulcerative colitis ([place]) 03/23/2011    Ahlani Wickes,CHRIS, PTA 03/19/2019, 5:18 PM  [facility] [place], [place], [address] Phone: [phone]   Fax:  [phone]  Name: Celicia Minahan MRN: [id number] Date of Birth: Mar 03, 1936

## 2019-03-22 ENCOUNTER — Encounter: Payer: Self-pay | Admitting: Family Medicine

## 2019-03-22 ENCOUNTER — Other Ambulatory Visit: Payer: Self-pay | Admitting: Family Medicine

## 2019-03-22 DIAGNOSIS — E559 Vitamin D deficiency, unspecified: Secondary | ICD-10-CM

## 2019-03-22 DIAGNOSIS — Z780 Asymptomatic menopausal state: Secondary | ICD-10-CM

## 2019-03-22 DIAGNOSIS — M8580 Other specified disorders of bone density and structure, unspecified site: Secondary | ICD-10-CM

## 2019-03-26 ENCOUNTER — Ambulatory Visit: Payer: Medicare Other | Admitting: *Deleted

## 2019-03-26 ENCOUNTER — Other Ambulatory Visit: Payer: Self-pay

## 2019-03-26 DIAGNOSIS — M546 Pain in thoracic spine: Secondary | ICD-10-CM

## 2019-03-26 DIAGNOSIS — M6281 Muscle weakness (generalized): Secondary | ICD-10-CM | POA: Diagnosis not present

## 2019-03-26 DIAGNOSIS — M545 Low back pain, unspecified: Secondary | ICD-10-CM

## 2019-03-26 DIAGNOSIS — R293 Abnormal posture: Secondary | ICD-10-CM

## 2019-03-26 DIAGNOSIS — G8929 Other chronic pain: Secondary | ICD-10-CM

## 2019-03-26 NOTE — Therapy (Signed)
[facility] [place], [place], [address] Phone: [phone]   Fax:  [phone]  Physical Therapy Treatment  Patient Details  Name: Lori Soto MRN: [id number] Date of Birth: Dec 11, 1935 Referring Provider (PT): Beau Fanny, [place]   Encounter Date: 03/26/2019  PT End of Session - 03/26/19 1303    Visit Number  31    Number of Visits  34    Date for PT Re-Evaluation  04/12/19    PT Start Time  1120    PT Stop Time  1210    PT Time Calculation (min)  50 min       Past Medical History:  Diagnosis Date  . Anemia   . Arthritis   . Cancer ([place])    skin cancer on nose  . Carpal tunnel syndrome, bilateral   . Cataract   . Esophagitis   . GERD (gastroesophageal reflux disease)   . H/O hiatal hernia   . Osteopenia   . Scoliosis   . Shingles   . SVT (supraventricular tachycardia) ([place])   . Ulcerative colitis     Past Surgical History:  Procedure Laterality Date  . ABDOMINAL HYSTERECTOMY    . BUNIONECTOMY     Bilateral  . CARPAL TUNNEL RELEASE Left 03/10/2015   Procedure: LEFT CARPAL TUNNEL RELEASE;  Surgeon: Daryll Brod, MD;  Location: [facility];  Service: Orthopedics;  Laterality: Left;  . CARPAL TUNNEL RELEASE Right 10/31/2017   Procedure: RIGHT CARPAL TUNNEL RELEASE;  Surgeon: Daryll Brod, MD;  Location: [facility];  Service: Orthopedics;  Laterality: Right;  . CATARACT EXTRACTION W/PHACO  05/28/2012   Procedure: CATARACT EXTRACTION PHACO AND INTRAOCULAR LENS PLACEMENT (IOC);  Surgeon: Tonny Branch, MD;  [facility];  Service: Ophthalmology;  Laterality: Right;  CDE=12.84  . CATARACT EXTRACTION W/PHACO  06/07/2012   Procedure: CATARACT EXTRACTION PHACO AND INTRAOCULAR LENS PLACEMENT (IOC);  Surgeon: Tonny Branch, MD;  [facility];  Service: Ophthalmology;  Laterality: Left;  CDE 17.60  . COLONOSCOPY    . COLONOSCOPY N/A 07/08/2015   Procedure: COLONOSCOPY;  Surgeon: Rogene Houston, MD;  [facility];  Service: Endoscopy;  Laterality: N/A;  930  . ESOPHAGEAL DILATION N/A 07/08/2015   Procedure: ESOPHAGEAL DILATION;  Surgeon: Rogene Houston, MD;  [facility];  Service: Endoscopy;  Laterality: N/A;  . ESOPHAGOGASTRODUODENOSCOPY N/A 07/08/2015   Procedure: ESOPHAGOGASTRODUODENOSCOPY (EGD);  Surgeon: Rogene Houston, MD;  [facility];  Service: Endoscopy;  Laterality: N/A;  . Hernia     Right inguinal  . HERNIA REPAIR  1961   right inguinal hernia  . UPPER GASTROINTESTINAL ENDOSCOPY      There were no vitals filed for this visit.                    [facility] Adult PT Treatment/Exercise - 03/26/19 0001      Modalities   Modalities  --      Traction   Type of Traction  Lumbar    Min (lbs)  5    Max (lbs)  80    Hold Time  99    Rest Time  5    Time  15      Manual Therapy   Manual Therapy  Soft tissue mobilization    Soft tissue mobilization  STW to R SI joint, superior glute, Bil thoracic paras QL in prone to reduce soreness and muscle tightness  PT Long Term Goals - 10/30/18 1121      PT LONG TERM GOAL #1   Title  Patient will be independent with HEP    Time  6    Period  Weeks    Status  Achieved      PT LONG TERM GOAL #2   Title  Patient will report ability to perfrom ADLs with low back pain less than or equal to 4/10    Time  6    Period  Weeks    Status  On-going   slow to perform     PT LONG TERM GOAL #3   Title  Patient will demonstrate 4/5 or greater bilateral LE MMT to improve stability during functional tasks.    Time  6    Period  Weeks    Status  On-going      PT LONG TERM GOAL #4   Title  Patient will report ability to stand for 20 minutes or greater with low back pain less than or equal to 5/10 to improve ability to perform home tasks.     Time  6    Period  Weeks    Status  Partially Met   varies on the day but overall can complete            Plan - 03/26/19 1305    Clinical Impression Statement  Pt arrived today with increased soreness in Bil. thoracic area. She had some tenderness in Bil QLs and in thoracic paras noted during STW. Pt reports she continually does better with ADLs after Rxs, TRaction was performed at 80#s today and tolerated well.    Stability/Clinical Decision Making  Evolving/Moderate complexity    Rehab Potential  Good    PT Frequency  2x / week    PT Duration  6 weeks    PT Treatment/Interventions  Electrical Stimulation;ADLs/Self Care Home Management;Cryotherapy;Traction;Ultrasound;Moist Heat;Iontophoresis 37m/ml Dexamethasone;Gait training;Stair training;Manual techniques;Dry needling;Passive range of motion;Balance training;Neuromuscular re-education;Therapeutic exercise;Therapeutic activities;Patient/family education    PT Next Visit Plan  cont STW/M to right low back/ Traction    PT Home Exercise Plan  see patient education section    Consulted and Agree with Plan of Care  Patient       Patient will benefit from skilled therapeutic intervention in order to improve the following deficits and impairments:  Abnormal gait, Pain, Postural dysfunction, Decreased activity tolerance, Decreased range of motion, Decreased strength  Visit Diagnosis: Abnormal posture  Chronic bilateral low back pain without sciatica  Muscle weakness (generalized)  Pain in thoracic spine     Problem List Patient Active Problem List   Diagnosis Date Noted  . SVT ([place]ricular tachycardia) [place] 08/28/2018  . Elevated troponin 08/28/2018  . Light headedness 05/09/2018  . Osteopenia of the elderly 02/19/2014  . DDD (degenerative disc disease), lumbar 12/03/2013  . Scoliosis 12/03/2013  . Chronic LBP 05/14/2013  . IBS (irritable bowel syndrome) 11/08/2012  . UC (ulcerative colitis) ([place] 04/23/2012  . Palpitation 10/26/2011  . Hypertension 10/26/2011  . Chronic ulcerative colitis ([place] 03/23/2011     RAMSEUR,CHRIS, PTA 03/26/2019, 4:50 PM  [facility] [address]Phone: [phone]  Fax:  [phone] Name: Lori WestleyMRN: [id number] of Birth: 816-Oct-1937

## 2019-04-02 ENCOUNTER — Ambulatory Visit: Payer: Medicare Other | Attending: Orthopaedic Surgery | Admitting: *Deleted

## 2019-04-02 ENCOUNTER — Other Ambulatory Visit: Payer: Self-pay

## 2019-04-02 DIAGNOSIS — R293 Abnormal posture: Secondary | ICD-10-CM | POA: Insufficient documentation

## 2019-04-02 DIAGNOSIS — M545 Low back pain: Secondary | ICD-10-CM | POA: Insufficient documentation

## 2019-04-02 DIAGNOSIS — G8929 Other chronic pain: Secondary | ICD-10-CM | POA: Diagnosis present

## 2019-04-02 DIAGNOSIS — M6281 Muscle weakness (generalized): Secondary | ICD-10-CM | POA: Diagnosis present

## 2019-04-02 DIAGNOSIS — M546 Pain in thoracic spine: Secondary | ICD-10-CM | POA: Diagnosis present

## 2019-04-02 NOTE — Therapy (Signed)
[facility] [place], [place], [address] Phone: [phone]   Fax:  [phone]  Physical Therapy Treatment  Patient Details  Name: Lori Soto MRN: [id number] Date of Birth: August 12, 1935 Referring Provider (PT): Beau Fanny, [place]   Encounter Date: 04/02/2019  PT End of Session - 04/02/19 1201    Visit Number  32    Number of Visits  34    Date for PT Re-Evaluation  04/12/19    PT Start Time  1120    PT Stop Time  1211    PT Time Calculation (min)  51 min       Past Medical History:  Diagnosis Date  . Anemia   . Arthritis   . Cancer ([place])    skin cancer on nose  . Carpal tunnel syndrome, bilateral   . Cataract   . Esophagitis   . GERD (gastroesophageal reflux disease)   . H/O hiatal hernia   . Osteopenia   . Scoliosis   . Shingles   . SVT (supraventricular tachycardia) ([place])   . Ulcerative colitis     Past Surgical History:  Procedure Laterality Date  . ABDOMINAL HYSTERECTOMY    . BUNIONECTOMY     Bilateral  . CARPAL TUNNEL RELEASE Left 03/10/2015   Procedure: LEFT CARPAL TUNNEL RELEASE;  Surgeon: Daryll Brod, MD;  Location: [facility];  Service: Orthopedics;  Laterality: Left;  . CARPAL TUNNEL RELEASE Right 10/31/2017   Procedure: RIGHT CARPAL TUNNEL RELEASE;  Surgeon: Daryll Brod, MD;  Location: [facility];  Service: Orthopedics;  Laterality: Right;  . CATARACT EXTRACTION W/PHACO  05/28/2012   Procedure: CATARACT EXTRACTION PHACO AND INTRAOCULAR LENS PLACEMENT (IOC);  Surgeon: Tonny Branch, MD;  [facility];  Service: Ophthalmology;  Laterality: Right;  CDE=12.84  . CATARACT EXTRACTION W/PHACO  06/07/2012   Procedure: CATARACT EXTRACTION PHACO AND INTRAOCULAR LENS PLACEMENT (IOC);  Surgeon: Tonny Branch, MD;  [facility];  Service: Ophthalmology;  Laterality: Left;  CDE 17.60  . COLONOSCOPY    . COLONOSCOPY N/A 07/08/2015   Procedure: COLONOSCOPY;  Surgeon: Rogene Houston, MD;  [facility];  Service: Endoscopy;  Laterality: N/A;  930  . ESOPHAGEAL DILATION N/A 07/08/2015   Procedure: ESOPHAGEAL DILATION;  Surgeon: Rogene Houston, MD;  [facility];  Service: Endoscopy;  Laterality: N/A;  . ESOPHAGOGASTRODUODENOSCOPY N/A 07/08/2015   Procedure: ESOPHAGOGASTRODUODENOSCOPY (EGD);  Surgeon: Rogene Houston, MD;  [facility];  Service: Endoscopy;  Laterality: N/A;  . Hernia     Right inguinal  . HERNIA REPAIR  1961   right inguinal hernia  . UPPER GASTROINTESTINAL ENDOSCOPY      There were no vitals filed for this visit.  Subjective Assessment - 04/02/19 1152    Subjective  COVID19 screening performed prior to entering the building.  Pt arrived today with 8-9/10 LBP. She reports being able to do more standing activities over the weekend.    Pertinent History  Scoliosis, hiatial hernia, osteopenia, allergy to tape    How long can you stand comfortably?  20 minutes    How long can you walk comfortably?  20-30 minutes max    Patient Stated Goals  improve ability to perform home activities with less pain    Currently in Pain?  Yes    Pain Score  9     Pain Location  Back    Pain Orientation  Right    Pain Descriptors /  Indicators  Discomfort    Pain Type  Chronic pain    Pain Onset  More than a month ago                       [facility] Adult PT Treatment/Exercise - 04/02/19 0001      Traction   Type of Traction  Lumbar    Min (lbs)  5    Max (lbs)  80    Hold Time  99    Rest Time  5    Time  15      Manual Therapy   Manual Therapy  Soft tissue mobilization    Soft tissue mobilization  STW to R  and LT SI joint, superior glute, Bil thoracic paras QL in prone to reduce soreness and muscle tightness                  PT Long Term Goals - 10/30/18 1121      PT LONG TERM GOAL #1   Title  Patient will be independent with HEP    Time  6    Period  Weeks    Status  Achieved      PT LONG  TERM GOAL #2   Title  Patient will report ability to perfrom ADLs with low back pain less than or equal to 4/10    Time  6    Period  Weeks    Status  On-going   slow to perform     PT LONG TERM GOAL #3   Title  Patient will demonstrate 4/5 or greater bilateral LE MMT to improve stability during functional tasks.    Time  6    Period  Weeks    Status  On-going      PT LONG TERM GOAL #4   Title  Patient will report ability to stand for 20 minutes or greater with low back pain less than or equal to 5/10 to improve ability to perform home tasks.     Time  6    Period  Weeks    Status  Partially Met   varies on the day but overall can complete           Plan - 04/02/19 1204    Clinical Impression Statement  Pt arrived today not doing well with 8-9/10 LBP. She had noted tightness in thoracolumbar paras and QL, but had good release with STW. Traction was performed at 80's again and tolerated well. Pt reported decreased pain to 4/10 end of session.    Stability/Clinical Decision Making  Evolving/Moderate complexity    Rehab Potential  Good    PT Frequency  2x / week    PT Duration  6 weeks    PT Treatment/Interventions  Electrical Stimulation;ADLs/Self Care Home Management;Cryotherapy;Traction;Ultrasound;Moist Heat;Iontophoresis 11m/ml Dexamethasone;Gait training;Stair training;Manual techniques;Dry needling;Passive range of motion;Balance training;Neuromuscular re-education;Therapeutic exercise;Therapeutic activities;Patient/family education    PT Next Visit Plan  cont STW/M to right low back/ Traction    PT Home Exercise Plan  see patient education section    Consulted and Agree with Plan of Care  Patient       Patient will benefit from skilled therapeutic intervention in order to improve the following deficits and impairments:  Abnormal gait, Pain, Postural dysfunction, Decreased activity tolerance, Decreased range of motion, Decreased strength  Visit Diagnosis: Abnormal  posture  Chronic bilateral low back pain without sciatica  Muscle weakness (generalized)  Pain in thoracic spine  Chronic  right-sided low back pain without sciatica     Problem List Patient Active Problem List   Diagnosis Date Noted  . SVT (supraventricular tachycardia) ([place]) 08/28/2018  . Elevated troponin 08/28/2018  . Light headedness 05/09/2018  . Osteopenia of the elderly 02/19/2014  . DDD (degenerative disc disease), lumbar 12/03/2013  . Scoliosis 12/03/2013  . Chronic LBP 05/14/2013  . IBS (irritable bowel syndrome) 11/08/2012  . UC (ulcerative colitis) ([place]) 04/23/2012  . Palpitation 10/26/2011  . Hypertension 10/26/2011  . Chronic ulcerative colitis ([place]) 03/23/2011    RAMSEUR,CHRIS, PTA 04/02/2019, 3:12 PM  [facility] [place], [place], [address] Phone: [phone]   Fax:  [phone]  Name: Fadumo Heng MRN: [id number] Date of Birth: 1935-09-05

## 2019-04-03 ENCOUNTER — Telehealth: Payer: Self-pay | Admitting: Family Medicine

## 2019-04-03 NOTE — Chronic Care Management (AMB) (Signed)
Chronic Care Management   Note  04/03/2019 Name: Lori Soto MRN: [id number] DOB: [birth year]  Lori Soto is a 83 y.o. year old female who is a primary care patient of Ronnie Doss M, DO. I reached out to [name] by phone today in response to a referral sent by [name].     [name] was given information about Chronic Care Management services today including:  1. CCM service includes personalized support from designated clinical staff supervised by her physician, including individualized plan of care and coordination with other care providers 2. 24/7 contact phone numbers for assistance for urgent and routine care needs. 3. Service will only be billed when office clinical staff spend 20 minutes or more in a month to coordinate care. 4. Only one practitioner may furnish and bill the service in a calendar month. 5. The patient may stop CCM services at any time (effective at the end of the month) by phone call to the office staff. 6. The patient will be responsible for cost sharing (co-pay) of up to 20% of the service fee (after annual deductible is met).  Patient agreed to services and verbal consent obtained.   Follow up plan: Telephone appointment with CCM team member scheduled for: 04/24/2019  [place], [place] [facility]  [place], [place] [address] Direct Dial: [email or web address]  Website: [email or web address]

## 2019-04-09 ENCOUNTER — Other Ambulatory Visit: Payer: Self-pay

## 2019-04-09 ENCOUNTER — Ambulatory Visit: Payer: Medicare Other | Admitting: *Deleted

## 2019-04-09 DIAGNOSIS — M545 Low back pain, unspecified: Secondary | ICD-10-CM

## 2019-04-09 DIAGNOSIS — M546 Pain in thoracic spine: Secondary | ICD-10-CM

## 2019-04-09 DIAGNOSIS — M6281 Muscle weakness (generalized): Secondary | ICD-10-CM

## 2019-04-09 DIAGNOSIS — R293 Abnormal posture: Secondary | ICD-10-CM | POA: Diagnosis not present

## 2019-04-09 DIAGNOSIS — G8929 Other chronic pain: Secondary | ICD-10-CM

## 2019-04-09 NOTE — Therapy (Signed)
[facility] [place], [place], [address] Phone: [phone]   Fax:  [phone]  Physical Therapy Treatment  Patient Details  Name: Lori Soto MRN: [id number] Date of Birth: 12/18/1935 Referring Provider (PT): Beau Fanny, [place]   Encounter Date: 04/09/2019  PT End of Session - 04/09/19 1202    Visit Number  33    Number of Visits  34    Date for PT Re-Evaluation  04/12/19    PT Start Time  1117    PT Stop Time  1209    PT Time Calculation (min)  52 min       Past Medical History:  Diagnosis Date  . Anemia   . Arthritis   . Cancer ([place])    skin cancer on nose  . Carpal tunnel syndrome, bilateral   . Cataract   . Esophagitis   . GERD (gastroesophageal reflux disease)   . H/O hiatal hernia   . Osteopenia   . Scoliosis   . Shingles   . SVT (supraventricular tachycardia) ([place])   . Ulcerative colitis     Past Surgical History:  Procedure Laterality Date  . ABDOMINAL HYSTERECTOMY    . BUNIONECTOMY     Bilateral  . CARPAL TUNNEL RELEASE Left 03/10/2015   Procedure: LEFT CARPAL TUNNEL RELEASE;  Surgeon: Daryll Brod, MD;  Location: [facility];  Service: Orthopedics;  Laterality: Left;  . CARPAL TUNNEL RELEASE Right 10/31/2017   Procedure: RIGHT CARPAL TUNNEL RELEASE;  Surgeon: Daryll Brod, MD;  Location: [facility];  Service: Orthopedics;  Laterality: Right;  . CATARACT EXTRACTION W/PHACO  05/28/2012   Procedure: CATARACT EXTRACTION PHACO AND INTRAOCULAR LENS PLACEMENT (IOC);  Surgeon: Tonny Branch, MD;  [facility];  Service: Ophthalmology;  Laterality: Right;  CDE=12.84  . CATARACT EXTRACTION W/PHACO  06/07/2012   Procedure: CATARACT EXTRACTION PHACO AND INTRAOCULAR LENS PLACEMENT (IOC);  Surgeon: Tonny Branch, MD;  [facility];  Service: Ophthalmology;  Laterality: Left;  CDE 17.60  . COLONOSCOPY    . COLONOSCOPY N/A 07/08/2015   Procedure: COLONOSCOPY;  Surgeon: Rogene Houston, MD;  [facility];  Service: Endoscopy;  Laterality: N/A;  930  . ESOPHAGEAL DILATION N/A 07/08/2015   Procedure: ESOPHAGEAL DILATION;  Surgeon: Rogene Houston, MD;  [facility];  Service: Endoscopy;  Laterality: N/A;  . ESOPHAGOGASTRODUODENOSCOPY N/A 07/08/2015   Procedure: ESOPHAGOGASTRODUODENOSCOPY (EGD);  Surgeon: Rogene Houston, MD;  [facility];  Service: Endoscopy;  Laterality: N/A;  . Hernia     Right inguinal  . HERNIA REPAIR  1961   right inguinal hernia  . UPPER GASTROINTESTINAL ENDOSCOPY      There were no vitals filed for this visit.  Subjective Assessment - 04/09/19 1200    Subjective  COVID19 screening performed prior to entering the building.  Pt arrived today with 7-8/10 LBP today. Yesterday was worse    Pertinent History  Scoliosis, hiatial hernia, osteopenia, allergy to tape    Limitations  Sitting;House hold activities;Standing;Walking    How long can you stand comfortably?  20 minutes    How long can you walk comfortably?  20-30 minutes max    Diagnostic tests  x-ray    Patient Stated Goals  improve ability to perform home activities with less pain    Currently in Pain?  Yes    Pain Score  7     Pain Location  Back    Pain Orientation  Right    Pain Descriptors / Indicators  Discomfort    Pain Type  Chronic pain    Pain Onset  More than a month ago    Pain Frequency  Constant                       [facility] Adult PT Treatment/Exercise - 04/09/19 0001      Traction   Type of Traction  Lumbar    Min (lbs)  5    Max (lbs)  80    Hold Time  99    Rest Time  5    Time  15      Manual Therapy   Manual Therapy  Soft tissue mobilization    Soft tissue mobilization  STW in prone to R  and LT SI joint, superior glute, Bil thoracic paras QL in prone to reduce soreness and muscle tightness                  PT Long Term Goals - 10/30/18 1121      PT LONG TERM GOAL #1   Title  Patient will be  independent with HEP    Time  6    Period  Weeks    Status  Achieved      PT LONG TERM GOAL #2   Title  Patient will report ability to perfrom ADLs with low back pain less than or equal to 4/10    Time  6    Period  Weeks    Status  On-going   slow to perform     PT LONG TERM GOAL #3   Title  Patient will demonstrate 4/5 or greater bilateral LE MMT to improve stability during functional tasks.    Time  6    Period  Weeks    Status  On-going      PT LONG TERM GOAL #4   Title  Patient will report ability to stand for 20 minutes or greater with low back pain less than or equal to 5/10 to improve ability to perform home tasks.     Time  6    Period  Weeks    Status  Partially Met   varies on the day but overall can complete           Plan - 04/09/19 1208    Clinical Impression Statement  Pt arrived today doing better after last Rx. She reported having a bad day yesterday due to being up alot. She had notable  tightness along Bil. LB paras and very tender RT QL. Good TP release today. Traction performed at 80#s today and tolerated well. Pain decreased 4-5/10 end of session.    Stability/Clinical Decision Making  Evolving/Moderate complexity    Rehab Potential  Good    PT Frequency  2x / week    PT Treatment/Interventions  Electrical Stimulation;ADLs/Self Care Home Management;Cryotherapy;Traction;Ultrasound;Moist Heat;Iontophoresis 76m/ml Dexamethasone;Gait training;Stair training;Manual techniques;Dry needling;Passive range of motion;Balance training;Neuromuscular re-education;Therapeutic exercise;Therapeutic activities;Patient/family education    PT Next Visit Plan  DC after next visit    PT Home Exercise Plan  see patient education section       Patient will benefit from skilled therapeutic intervention in order to improve the following deficits and impairments:  Abnormal gait, Pain, Postural dysfunction, Decreased activity tolerance, Decreased range of motion, Decreased  strength  Visit Diagnosis: Abnormal posture  Chronic bilateral low back pain without sciatica  Muscle weakness (generalized)  Pain in thoracic  spine  Chronic right-sided low back pain without sciatica     Problem List Patient Active Problem List   Diagnosis Date Noted  . SVT (supraventricular tachycardia) ([place]) 08/28/2018  . Elevated troponin 08/28/2018  . Light headedness 05/09/2018  . Osteopenia of the elderly 02/19/2014  . DDD (degenerative disc disease), lumbar 12/03/2013  . Scoliosis 12/03/2013  . Chronic LBP 05/14/2013  . IBS (irritable bowel syndrome) 11/08/2012  . UC (ulcerative colitis) ([place]) 04/23/2012  . Palpitation 10/26/2011  . Hypertension 10/26/2011  . Chronic ulcerative colitis ([place]) 03/23/2011    Elkin Belfield,CHRIS, PTA 04/09/2019, 1:57 PM  [facility] [address] [place], [place], [address] Phone: [phone]   Fax:  [phone]  Name: Delsie Amador MRN: [id number] Date of Birth: 1935-07-03

## 2019-04-16 ENCOUNTER — Ambulatory Visit: Payer: Medicare Other | Admitting: Physical Therapy

## 2019-04-16 ENCOUNTER — Other Ambulatory Visit: Payer: Self-pay

## 2019-04-16 DIAGNOSIS — G8929 Other chronic pain: Secondary | ICD-10-CM

## 2019-04-16 DIAGNOSIS — R293 Abnormal posture: Secondary | ICD-10-CM

## 2019-04-16 DIAGNOSIS — M545 Low back pain, unspecified: Secondary | ICD-10-CM

## 2019-04-16 NOTE — Therapy (Signed)
[facility] [place], [place], [address] Phone: [phone]   Fax:  [phone]  Physical Therapy Treatment  Patient Details  Name: Lori Soto MRN: [id number] Date of Birth: [birth year] Referring Provider (PT): Beau Fanny, [place]   Encounter Date: 04/16/2019  PT End of Session - 04/16/19 1256    Visit Number  34    Number of Visits  34    Date for PT Re-Evaluation  04/12/19    PT Start Time  8889   Late for treatment.   PT Stop Time  1200    PT Time Calculation (min)  36 min    Activity Tolerance  Patient tolerated treatment well    Behavior During Therapy  WFL for tasks assessed/performed       Past Medical History:  Diagnosis Date  . Anemia   . Arthritis   . Cancer ([place])    skin cancer on nose  . Carpal tunnel syndrome, bilateral   . Cataract   . Esophagitis   . GERD (gastroesophageal reflux disease)   . H/O hiatal hernia   . Osteopenia   . Scoliosis   . Shingles   . SVT (supraventricular tachycardia) ([place])   . Ulcerative colitis     Past Surgical History:  Procedure Laterality Date  . ABDOMINAL HYSTERECTOMY    . BUNIONECTOMY     Bilateral  . CARPAL TUNNEL RELEASE Left 03/10/2015   Procedure: LEFT CARPAL TUNNEL RELEASE;  Surgeon: Daryll Brod, MD;  Location: [place];  Service: Orthopedics;  Laterality: Left;  . CARPAL TUNNEL RELEASE Right 10/31/2017   Procedure: RIGHT CARPAL TUNNEL RELEASE;  Surgeon: Daryll Brod, MD;  Location: [place];  Service: Orthopedics;  Laterality: Right;  . CATARACT EXTRACTION W/PHACO  05/28/2012   Procedure: CATARACT EXTRACTION PHACO AND INTRAOCULAR LENS PLACEMENT (IOC);  Surgeon: Tonny Branch, MD;  Location: [facility];  Service: Ophthalmology;  Laterality: Right;  CDE=12.84  . CATARACT EXTRACTION W/PHACO  06/07/2012   Procedure: CATARACT EXTRACTION PHACO AND INTRAOCULAR LENS PLACEMENT (IOC);  Surgeon: Tonny Branch, MD;  Location: [facility];   Service: Ophthalmology;  Laterality: Left;  CDE 17.60  . COLONOSCOPY    . COLONOSCOPY N/A 07/08/2015   Procedure: COLONOSCOPY;  Surgeon: Rogene Houston, MD;  [facility];  Service: Endoscopy;  Laterality: N/A;  930  . ESOPHAGEAL DILATION N/A 07/08/2015   Procedure: ESOPHAGEAL DILATION;  Surgeon: Rogene Houston, MD;  [facility];  Service: Endoscopy;  Laterality: N/A;  . ESOPHAGOGASTRODUODENOSCOPY N/A 07/08/2015   Procedure: ESOPHAGOGASTRODUODENOSCOPY (EGD);  Surgeon: Rogene Houston, MD;  [facility];  Service: Endoscopy;  Laterality: N/A;  . Hernia     Right inguinal  . HERNIA REPAIR  1961   right inguinal hernia  . UPPER GASTROINTESTINAL ENDOSCOPY      There were no vitals filed for this visit.  Subjective Assessment - 04/16/19 1258    Subjective  I feel good after treatment.  Every day I don't get traction my pain increases.    Pertinent History  Scoliosis, hiatial hernia, osteopenia, allergy to tape    Limitations  Sitting;House hold activities;Standing;Walking    How long can you stand comfortably?  20 minutes    How long can you walk comfortably?  20-30 minutes max    Diagnostic tests  x-ray    Patient Stated Goals  improve ability to perform home activities with less pain    Currently in Pain?  Yes    Pain Location  Back    Pain Orientation  Right    Pain Descriptors / Indicators  Discomfort    Pain Onset  More than a month ago                       [facility] Adult PT Treatment/Exercise - 04/16/19 0001      Modalities   Modalities  Electrical Stimulation;Moist Heat      Moist Heat Therapy   Number Minutes Moist Heat  15 Minutes    Moist Heat Location  Lumbar Spine      Electrical Stimulation   Electrical Stimulation Location  Bil low back.    Electrical Stimulation Action  Pre-mod.    Electrical Stimulation Parameters  80-150 Hz x 15 minutes.    Electrical Stimulation Goals  Pain      Traction   Type of Traction   Lumbar    Min (lbs)  5    Max (lbs)  80    Hold Time  99    Rest Time  5    Time  15                  PT Long Term Goals - 04/16/19 1257      PT LONG TERM GOAL #1   Title  Patient will be independent with HEP    Time  6    Period  Weeks    Status  Achieved      PT LONG TERM GOAL #2   Title  Patient will report ability to perfrom ADLs with low back pain less than or equal to 4/10    Time  6    Period  Weeks    Status  Not Met      PT LONG TERM GOAL #3   Title  Patient will demonstrate 4/5 or greater bilateral LE MMT to improve stability during functional tasks.    Time  6    Period  Weeks    Status  Not Met      PT LONG TERM GOAL #4   Title  Patient will report ability to stand for 20 minutes or greater with low back pain less than or equal to 5/10 to improve ability to perform home tasks.     Time  6    Period  Weeks    Status  Partially Met            Plan - 04/16/19 1300    Clinical Impression Statement  Please "Therapy Note" section.    Stability/Clinical Decision Making  Evolving/Moderate complexity    Rehab Potential  Good    PT Frequency  2x / week    PT Duration  6 weeks    PT Treatment/Interventions  Electrical Stimulation;ADLs/Self Care Home Management;Cryotherapy;Traction;Ultrasound;Moist Heat;Iontophoresis 94m/ml Dexamethasone;Gait training;Stair training;Manual techniques;Dry needling;Passive range of motion;Balance training;Neuromuscular re-education;Therapeutic exercise;Therapeutic activities;Patient/family education    PT Next Visit Plan  DC after next visit    PT Home Exercise Plan  see patient education section    Consulted and Agree with Plan of Care  Patient       Patient will benefit from skilled therapeutic intervention in order to improve the following deficits and impairments:  Abnormal gait, Pain, Postural dysfunction, Decreased activity tolerance, Decreased range of motion, Decreased strength  Visit Diagnosis: Abnormal  posture  Chronic bilateral low back pain without sciatica     Problem List Patient Active Problem List  Diagnosis Date Noted  . SVT (supraventricular tachycardia) ([place]) 08/28/2018  . Elevated troponin 08/28/2018  . Light headedness 05/09/2018  . Osteopenia of the elderly 02/19/2014  . DDD (degenerative disc disease), lumbar 12/03/2013  . Scoliosis 12/03/2013  . Chronic LBP 05/14/2013  . IBS (irritable bowel syndrome) 11/08/2012  . UC (ulcerative colitis) ([place]) 04/23/2012  . Palpitation 10/26/2011  . Hypertension 10/26/2011  . Chronic ulcerative colitis ([place]) 03/23/2011   PHYSICAL THERAPY DISCHARGE SUMMARY  Visits from Start of Care: 34  Current functional level related to goals / functional outcomes: See above.   Remaining deficits: See goal section.   Education / Equipment: HEP. Plan: Patient agrees to discharge.  Patient goals were not met. Patient is being discharged due to lack of progress.  ?????      Darvin Dials, [place] MPT 04/16/2019, 1:05 PM  [facility] [address] [place], [place], [address] Phone: [phone]   Fax:  [phone]  Name: Lori Soto MRN: [id number] Date of Birth: 11/03/1935

## 2019-04-24 ENCOUNTER — Ambulatory Visit (INDEPENDENT_AMBULATORY_CARE_PROVIDER_SITE_OTHER): Payer: Medicare Other | Admitting: *Deleted

## 2019-04-24 DIAGNOSIS — I10 Essential (primary) hypertension: Secondary | ICD-10-CM | POA: Diagnosis not present

## 2019-04-24 DIAGNOSIS — M8580 Other specified disorders of bone density and structure, unspecified site: Secondary | ICD-10-CM

## 2019-04-24 DIAGNOSIS — I471 Supraventricular tachycardia: Secondary | ICD-10-CM

## 2019-04-24 DIAGNOSIS — Z780 Asymptomatic menopausal state: Secondary | ICD-10-CM

## 2019-04-24 NOTE — Patient Instructions (Signed)
Visit Information  Goals Addressed            This Visit's Progress     Patient Stated   . Chronic Disease Management Needs (pt-stated)       Current Barriers:  . Chronic Disease Management support, education, and care coordination needs related to HTN, SVT, Arthritis, chronic ulcerative colitis, osteopenia  Clinical Goal(s) related to HTN, SVT, Arthritis, chronic ulcerative colitis, osteopenia:  Over the next 60 days, patient will:  . Work with the care management team to address educational, disease management, and care coordination needs  . Begin or continue self health monitoring activities as directed today  measure and record blood pressure weekly and PRN . Call provider office for new or worsened signs and symptoms New or worsened symptom related to SVT . Call care management team with questions or concerns . Verbalize basic understanding of patient centered plan of care established today  Interventions related to HTN, SVT, Arthritis, chronic ulcerative colitis, osteopenia:  . Evaluation of current treatment plans and patient's adherence to plan as established by provider . Assessed patient understanding of disease states . Assessed patient's education and care coordination needs . Provided disease specific education to patient  . Collaborated with appropriate clinical care team members regarding patient needs  Patient Self Care Activities related to HTN, SVT, Arthritis, chronic ulcerative colitis, osteopenia:  . Patient is unable to independently self-manage chronic health conditions  Initial goal documentation     . Schedule Bone Density Scan (pt-stated)       Current Barriers:  . Has not been contacted to schedule bone density scan . Unsure of procedure for scheduling bone density scan  Nurse Case Manager Clinical Goal(s):  [name] Over the next 14 days, patient will work with scheduling staff to schedule dexa scan at [facility]  Interventions:  . Chart  reviewed o Last dexa scan was in 2015 . Discussed osteopenia diagnosis . Discussed scoliosis and her physical limitations with lying flat o Has had to do wrist scan in the past because of inability to lay flat . Collaboration with [facility] staff to order dexa scan at [facility] . Asked patient to let me know if she has not heard from scheduling over the next 2 weeks  Patient Self Care Activities:  . Performs ADL's independently . Performs IADL's independently  Initial goal documentation     . Schedule Follow-up with PCP (pt-stated)       Routine in-office visit with PCP and lab tests needed in patient with hypertension and SVT.  Current Barriers:  . Appointment with PCP has not been scheduled since Dr Laurance Flatten retired  Nurse Case Manager Clinical Goal(s):  [name] Over the next 14 days, patient will work with [facility] staff to schedule a face-to-face visit with Dr Lajuana Ripple   Interventions:  . Discussed need for face-to-face visit and labs with patient . Reviewed previous lab results and discussed previously abnormal potassium levels . Collaboration with [facility] office staff to schedule appointment . Advised patient to let me know if she hasn't received a call within the next 2 weeks . Provided with RN CCM contact information  Patient Self Care Activities:  . Performs ADL's independently . Performs IADL's independently  Initial goal documentation       Other   . Prevent falls       Current Barriers:  [name] Knowledge Deficits related to fall precautions . Decreased adherence to prescribed treatment for fall prevention  Nurse Case Manager Clinical Goal(s):  .  Over the next 60 days, patient will demonstrate improved adherence to prescribed treatment plan for decreasing falls as evidenced by patient reporting and review of EMR . Over the next 60 days, patient will verbalize using fall risk reduction strategies discussed . Over the next 90 days, patient will not experience additional  falls  Interventions:  . Provided written and verbal education re: Potential causes of falls and Fall prevention strategies . Reviewed medications and discussed potential side effects of medications such as dizziness and frequent urination . Assessed for s/s of orthostatic hypotension . Assessed for falls since last encounter. . Assessed patients knowledge of fall risk prevention secondary to previously provided education.  Patient Self Care Activities:  . Use handrails on stairs . De-clutter walkways . Change positions slowly . Wear secure fitting shoes at all times with ambulation . Utilize home lighting for dim lit areas . Have self and pet awareness at all times  Plan: . CCM RN CM will follow up in 60   Initial goal documentation     . Scoliosis Management       Current Barriers:  . Chronic Disease Management support and education needs related to scoliosis  Nurse Case Manager Clinical Goal(s):  [name] Over the next 90 days, patient will continue to follow maintenance plan for controlling scoliosis pain and curvature  Interventions:  . Evaluation of current treatment plan related to scoliosis and patient's adherence to plan as established by provider. . Reviewed medications with patient and discussed patient's non-pharmacological strategies for managing pain. She does not want to take pain medication.  o Just completed sessions of traction that help with curvature. She feels that it needs to be twice weekly forever to really help maintain position. Insurance will only cover a limited number of sessions each year. o Had spinal injection last year that has helped. Can tell that it is wearing off now.  . Discussed plans with patient for ongoing care management follow up and provided patient with direct contact information for care management team  Patient Self Care Activities:  . Performs ADL's independently . Performs IADL's independently   Initial goal documentation     .  SVT Management       Current Barriers:  . Chronic Disease Management support and education needs related to SVT  Nurse Case Manager Clinical Goal(s):  [name] Over the next 90 days, patient will not experience any SVT episodes  Interventions:  . Evaluation of current treatment plan related to SVT and patient's adherence to plan as established by provider. . Advised patient to reach out to Dr Percival Spanish with any new or worsening symptoms . Reviewed medications with patient and discussed Metoprolol 80m and how she takes it . Discussed plans with patient for ongoing care management follow up and provided patient with direct contact information for care management team  Patient Self Care Activities:  . Performs ADL's independently . Performs IADL's independently  Initial goal documentation        The care management team will reach out to the patient again over the next 30 days.    Ms. WSharmawas given information about Chronic Care Management services today including:  1. CCM service includes personalized support from designated clinical staff supervised by her physician, including individualized plan of care and coordination with other care providers 2. 24/7 contact phone numbers for assistance for urgent and routine care needs. 3. Service will only be billed when office clinical staff spend 20 minutes or more in a month  to coordinate care. 4. Only one practitioner may furnish and bill the service in a calendar month. 5. The patient may stop CCM services at any time (effective at the end of the month) by phone call to the office staff. 6. The patient will be responsible for cost sharing (co-pay) of up to 20% of the service fee (after annual deductible is met).  Patient agreed to services and verbal consent obtained.   Lori Soto, BSN, RN-BC Embedded Chronic Care Manager [facility] [place] [facility] Direct Dial: [phone]   The patient verbalized  understanding of instructions provided today and declined a print copy of patient instruction materials.

## 2019-04-24 NOTE — Chronic Care Management (AMB) (Signed)
Chronic Care Management   Initial Visit Note  04/24/2019 Name: Lori Soto MRN: [id number] DOB: June 03, 1935  Referred by: Lori Norlander, DO Reason for referral : Chronic Care Management (RN Inititial Visit)   Lori Soto is a 83 y.o. year old female who is a primary care patient of Lori Norlander, DO. The CCM team was consulted for assistance with chronic disease management and care coordination needs related to HTN, SVT, Arthritis, chronic ulcerative colitis, osteopenia.  Review of patient status, including review of consultants reports, relevant laboratory and other test results, and collaboration with appropriate care team members and the patient's provider was performed as part of comprehensive patient evaluation and provision of chronic care management services.    SDOH (Social Determinants of Health) screening performed today: Physical Activity. See Care Plan for related entries.   Subjective: I spoke with Lori Soto by telephone today. She is a former patient of Dr Lori Soto and is now a patient of Dr Lori Soto since he retired. We discussed her scoliosis management, SVT, HTN and osteopenia.   Objective: Outpatient Encounter Medications as of 04/24/2019  Medication Sig Note  . acetaminophen (TYLENOL 8 HOUR ARTHRITIS PAIN) 650 MG CR tablet Take 650 mg by mouth every 8 (eight) hours as needed. Patient states that she takes prn for arthritis in her knees.    . Bromelains 500 MG TABS Take 500 mg by mouth daily.   . Calcium Carbonate-Vitamin D (CALCIUM 600+D) 600-400 MG-UNIT per tablet Take 1 tablet by mouth every evening. 12/04/2018: Currently holding due to elevated Calcium.  Lori Soto 500 MG CAPS Take 1 capsule by mouth daily.    . Coenzyme Q10 (CO Q-10 PO) Take 1 tablet by mouth daily. With red yeast rice [employer]   . COLLAGEN PO Take 1 tablet by mouth daily. This is known as 1-2-3   . Cyanocobalamin (VITAMIN B 12 PO) Take 1,000 mcg by mouth daily.      . Ginkgo Biloba 40 MG TABS Take 40 mg by mouth daily.    [name] glucosamine-chondroitin 500-400 MG tablet Take 1 tablet by mouth 2 (two) times daily.     [name] Hyaluronic Acid-Vitamin C (HYALURONIC ACID PO) Take 80 mg by mouth daily.   [name] loperamide (IMODIUM) 2 MG capsule Take 1 capsule (2 mg total) by mouth every morning.   . Mesalamine (ASACOL) 400 MG CPDR DR capsule TAKE 2 CAPSULES DAILY   . metoprolol tartrate (LOPRESSOR) 25 MG tablet Take 1 tablet (25 mg total) by mouth 2 (two) times daily. (Patient taking differently: Take 25 mg by mouth 2 (two) times daily. Take one table by mouth in the morning and 1/2 tablet in the evening)   . MULTIPLE VITAMINS PO Take 1 tablet by mouth daily. In the mornings   . Nutritional Supplements (GRAPESEED EXTRACT PO) Take 60 mg by mouth daily.    [name] OAT BRAN SOLUBLE PO Take 1 tablet by mouth daily.   [name] OVER THE COUNTER MEDICATION Take 66 mg by mouth daily. Vision Gold Lutein   . OVER THE COUNTER MEDICATION Take 250 mg by mouth daily. Alphalipoic Acid 250 mg daily   . pantoprazole (PROTONIX) 40 MG tablet TAKE 1 TABLET DAILY BEFORE BREAKFAST (Patient taking differently: Take 40 mg by mouth every morning. ) 12/04/2018: Patient states that she takes about 3 times a week.  . pyridOXINE (VITAMIN B-6) 100 MG tablet Take 100 mg by mouth daily.    . Turmeric 500 MG CAPS Take 1  capsule by mouth 3 (three) times daily.    . Vitamins C E (CRANBERRY CONCENTRATE PO) Take 2,500 mg by mouth daily.    . Zinc 50 MG CAPS Take 50 mg by mouth daily.     No facility-administered encounter medications on file as of 04/24/2019.    [name] RN Assessment & Care Plan   . Chronic Disease Management Needs (pt-stated)       Current Barriers:  . Chronic Disease Management support, education, and care coordination needs related to HTN, SVT, Arthritis, chronic ulcerative colitis, osteopenia  Clinical Goal(s) related to HTN, SVT, Arthritis, chronic ulcerative colitis, osteopenia:  Over the next 60 days,  patient will:  . Work with the care management team to address educational, disease management, and care coordination needs  . Begin or continue self health monitoring activities as directed today  measure and record blood pressure weekly and PRN . Call provider office for new or worsened signs and symptoms New or worsened symptom related to SVT . Call care management team with questions or concerns . Verbalize basic understanding of patient centered plan of care established today  Interventions related to HTN, SVT, Arthritis, chronic ulcerative colitis, osteopenia:  . Evaluation of current treatment plans and patient's adherence to plan as established by provider . Assessed patient understanding of disease states . Assessed patient's education and care coordination needs . Provided disease specific education to patient  . Collaborated with appropriate clinical care team members regarding patient needs  Patient Self Care Activities related to HTN, SVT, Arthritis, chronic ulcerative colitis, osteopenia:  . Patient is unable to independently self-manage chronic health conditions  Initial goal documentation     . Schedule Bone Density Scan (pt-stated)       Current Barriers:  . Has not been contacted to schedule bone density scan . Unsure of procedure for scheduling bone density scan  Nurse Case Manager Clinical Goal(s):  [name] Over the next 14 days, patient will work with scheduling staff to schedule dexa scan at [facility]  Interventions:  . Chart reviewed o Last dexa scan was in 2015 . Discussed osteopenia diagnosis . Discussed scoliosis and her physical limitations with lying flat o Has had to do wrist scan in the past because of inability to lay flat . Collaboration with [facility] staff to order dexa scan at [facility] . Asked patient to let me know if she has not heard from scheduling over the next 2 weeks  Patient Self Care Activities:  . Performs ADL's  independently . Performs IADL's independently  Initial goal documentation     . Schedule Follow-up with PCP (pt-stated)       Routine in-office visit with PCP and lab tests needed in patient with hypertension and SVT.  Current Barriers:  . Appointment with PCP has not been scheduled since Dr Laurance Flatten retired  Nurse Case Manager Clinical Goal(s):  [name] Over the next 14 days, patient will work with [facility] staff to schedule a face-to-face visit with Dr Lori Soto   Interventions:  . Discussed need for face-to-face visit and labs with patient . Reviewed previous lab results and discussed previously abnormal potassium levels . Collaboration with [facility] office staff to schedule appointment . Advised patient to let me know if she hasn't received a call within the next 2 weeks . Provided with RN CCM contact information  Patient Self Care Activities:  . Performs ADL's independently . Performs IADL's independently  Initial goal documentation       . Prevent  falls       Current Barriers:  [name] Knowledge Deficits related to fall precautions . Decreased adherence to prescribed treatment for fall prevention  Nurse Case Manager Clinical Goal(s):  [name] Over the next 60 days, patient will demonstrate improved adherence to prescribed treatment plan for decreasing falls as evidenced by patient reporting and review of EMR . Over the next 60 days, patient will verbalize using fall risk reduction strategies discussed . Over the next 90 days, patient will not experience additional falls  Interventions:  . Provided written and verbal education re: Potential causes of falls and Fall prevention strategies . Reviewed medications and discussed potential side effects of medications such as dizziness and frequent urination . Assessed for s/s of orthostatic hypotension . Assessed for falls since last encounter. . Assessed patients knowledge of fall risk prevention secondary to previously provided education.  Patient  Self Care Activities:  . Use handrails on stairs . De-clutter walkways . Change positions slowly . Wear secure fitting shoes at all times with ambulation . Utilize home lighting for dim lit areas . Have self and pet awareness at all times  Plan: . CCM RN CM will follow up in 60   Initial goal documentation     . Scoliosis Management       Current Barriers:  . Chronic Disease Management support and education needs related to scoliosis  Nurse Case Manager Clinical Goal(s):  [name] Over the next 90 days, patient will continue to follow maintenance plan for controlling scoliosis pain and curvature  Interventions:  . Evaluation of current treatment plan related to scoliosis and patient's adherence to plan as established by provider. . Reviewed medications with patient and discussed patient's non-pharmacological strategies for managing pain. She does not want to take pain medication.  o Just completed sessions of traction that help with curvature. She feels that it needs to be twice weekly forever to really help maintain position. Insurance will only cover a limited number of sessions each year. o Had spinal injection last year that has helped. Can tell that it is wearing off now.  . Discussed plans with patient for ongoing care management follow up and provided patient with direct contact information for care management team  Patient Self Care Activities:  . Performs ADL's independently . Performs IADL's independently   Initial goal documentation     . SVT Management       Current Barriers:  . Chronic Disease Management support and education needs related to SVT  Nurse Case Manager Clinical Goal(s):  [name] Over the next 90 days, patient will not experience any SVT episodes  Interventions:  . Evaluation of current treatment plan related to SVT and patient's adherence to plan as established by provider. . Advised patient to reach out to Dr Percival Spanish with any new or worsening  symptoms . Reviewed medications with patient and discussed Metoprolol 83m and how she takes it . Discussed plans with patient for ongoing care management follow up and provided patient with direct contact information for care management team  Patient Self Care Activities:  . Performs ADL's independently . Performs IADL's independently  Initial goal documentation        Follow-up Plan:   The care management team will reach out to the patient again over the next 30 days.   KChong Sicilian BSN, RN-BC Embedded Chronic Care Manager [facility] Direct Dial: [phone]

## 2019-05-16 ENCOUNTER — Other Ambulatory Visit: Payer: Self-pay

## 2019-05-17 ENCOUNTER — Other Ambulatory Visit: Payer: Medicare Other

## 2019-05-17 ENCOUNTER — Other Ambulatory Visit: Payer: Self-pay | Admitting: Family Medicine

## 2019-05-17 DIAGNOSIS — I10 Essential (primary) hypertension: Secondary | ICD-10-CM

## 2019-05-17 DIAGNOSIS — E782 Mixed hyperlipidemia: Secondary | ICD-10-CM

## 2019-05-18 LAB — CMP14+EGFR
ALT: 16 IU/L (ref 0–32)
AST: 21 IU/L (ref 0–40)
Albumin/Globulin Ratio: 1.7 (ref 1.2–2.2)
Albumin: 4.3 g/dL (ref 3.6–4.6)
Alkaline Phosphatase: 45 IU/L (ref 39–117)
BUN/Creatinine Ratio: 12 (ref 12–28)
BUN: 9 mg/dL (ref 8–27)
Bilirubin Total: 0.5 mg/dL (ref 0.0–1.2)
CO2: 25 mmol/L (ref 20–29)
Calcium: 9.9 mg/dL (ref 8.7–10.3)
Chloride: 104 mmol/L (ref 96–106)
Creatinine, Ser: 0.78 mg/dL (ref 0.57–1.00)
GFR calc Af Amer: 82 mL/min/{1.73_m2} (ref >59)
GFR calc non Af Amer: 72 mL/min/{1.73_m2} (ref >59)
Globulin, Total: 2.6 g/dL (ref 1.5–4.5)
Glucose: 87 mg/dL (ref 65–99)
Potassium: 4.6 mmol/L (ref 3.5–5.2)
Sodium: 144 mmol/L (ref 134–144)
Total Protein: 6.9 g/dL (ref 6.0–8.5)

## 2019-05-18 LAB — TSH: TSH: 1.94 u[IU]/mL (ref 0.450–4.500)

## 2019-05-18 LAB — LIPID PANEL
Chol/HDL Ratio: 2.9 ratio (ref 0.0–4.4)
Cholesterol, Total: 184 mg/dL (ref 100–199)
HDL: 64 mg/dL (ref >39)
LDL Chol Calc (NIH): 110 mg/dL — ABNORMAL HIGH (ref 0–99)
Triglycerides: 49 mg/dL (ref 0–149)
VLDL Cholesterol Cal: 10 mg/dL (ref 5–40)

## 2019-05-21 ENCOUNTER — Other Ambulatory Visit: Payer: Self-pay

## 2019-05-21 ENCOUNTER — Ambulatory Visit (INDEPENDENT_AMBULATORY_CARE_PROVIDER_SITE_OTHER): Payer: Medicare Other | Admitting: Family Medicine

## 2019-05-21 DIAGNOSIS — D649 Anemia, unspecified: Secondary | ICD-10-CM

## 2019-05-21 DIAGNOSIS — M5136 Other intervertebral disc degeneration, lumbar region: Secondary | ICD-10-CM

## 2019-05-21 DIAGNOSIS — L609 Nail disorder, unspecified: Secondary | ICD-10-CM | POA: Diagnosis not present

## 2019-05-21 DIAGNOSIS — R252 Cramp and spasm: Secondary | ICD-10-CM | POA: Diagnosis not present

## 2019-05-21 DIAGNOSIS — M419 Scoliosis, unspecified: Secondary | ICD-10-CM | POA: Diagnosis not present

## 2019-05-21 NOTE — Progress Notes (Signed)
Telephone visit  Subjective: CC: f/u labs PCP: [name], DO JXB:[id number] Lori Soto is a 84 y.o. female calls for telephone consult today. Patient provides verbal consent for consult held via phone.  Due to COVID-19 pandemic this visit was conducted virtually. This visit type was conducted due to national recommendations for restrictions regarding the COVID-19 Pandemic (e.g. social distancing, sheltering in place) in an effort to limit this patient's exposure and mitigate transmission in our community. All issues noted in this document were discussed and addressed.  A physical exam was not performed with this format.   Location of patient: home Location of provider: Working remotely from home Others present for call: none  1.  Leg cramping Patient reports that she gets intermittent cramping in the legs.  She wondered if perhaps her potassium was low.  She discontinued potassium supplements because it at one point was high and there was concern about it affecting her heart.  She also discontinued supplemental calcium because again calcium was high.  She worries because her lab had not been checked in a while despite having normalization of her lab results this past summer.  She was unsure at what interval they should be checked.  She does admit to eating calcium and potassium rich foods and wonders if this may be an issue going forward.  She does also comment that she has known scoliosis and was actually recently released from physical therapy in December.  Unfortunately, this was super helpful for her and Medicare will not continue to pay for more than a certain amount of sessions per year.  She would like to see if we can get these ordered again as they were very beneficial and she does not want to depend on medications for her back pain.  2.  Nail issue Patient reports that her nails seem to be abnormal appearing.  She has ridges in her nails and she wonders if this is related to having  stopped the calcium and potassium.  She does have a history of anemia when she was younger but has not had a problem with this in many years as far she knows.  ROS: Per HPI  Allergies  Allergen Reactions  . Penicillins Itching and Swelling    Patient states that she had a rash also Has patient had a PCN reaction causing immediate rash, facial/tongue/throat swelling, SOB or lightheadedness with hypotension: No Has patient had a PCN reaction causing severe rash involving mucus membranes or skin necrosis: No Has patient had a PCN reaction that required hospitalization No Has patient had a PCN reaction occurring within the last 10 years: No If all of the above answers are "NO", then may proceed with Cephalosporin use.   . Tape Itching  . Influenza Virus Vacc Split Pf Rash    Per Patient she was told by her PCP not to ever take this injection again   Past Medical History:  Diagnosis Date  . Anemia   . Arthritis   . Cancer ([place])    skin cancer on nose  . Carpal tunnel syndrome, bilateral   . Cataract   . Esophagitis   . GERD (gastroesophageal reflux disease)   . H/O hiatal hernia   . Osteopenia   . Scoliosis   . Shingles   . SVT (supraventricular tachycardia) ([place])   . Ulcerative colitis     Current Outpatient Medications:  .  acetaminophen (TYLENOL 8 HOUR ARTHRITIS PAIN) 650 MG CR tablet, Take 650 mg by mouth every 8 (  eight) hours as needed. Patient states that she takes prn for arthritis in her knees. , Disp: , Rfl:  .  Bromelains 500 MG TABS, Take 500 mg by mouth daily., Disp: , Rfl:  .  Calcium Carbonate-Vitamin D (CALCIUM 600+D) 600-400 MG-UNIT per tablet, Take 1 tablet by mouth every evening., Disp: , Rfl:  .  Cayenne 500 MG CAPS, Take 1 capsule by mouth daily. , Disp: , Rfl:  .  Coenzyme Q10 (CO Q-10 PO), Take 1 tablet by mouth daily. With red yeast rice [employer], Disp: , Rfl:  .  COLLAGEN PO, Take 1 tablet by mouth daily. This is known as 1-2-3, Disp: , Rfl:   .  Cyanocobalamin (VITAMIN B 12 PO), Take 1,000 mcg by mouth daily. , Disp: , Rfl:  .  Ginkgo Biloba 40 MG TABS, Take 40 mg by mouth daily. , Disp: , Rfl:  .  glucosamine-chondroitin 500-400 MG tablet, Take 1 tablet by mouth 2 (two) times daily.  , Disp: , Rfl:  .  Hyaluronic Acid-Vitamin C (HYALURONIC ACID PO), Take 80 mg by mouth daily., Disp: , Rfl:  .  loperamide (IMODIUM) 2 MG capsule, Take 1 capsule (2 mg total) by mouth every morning., Disp: 30 capsule, Rfl: o .  Mesalamine (ASACOL) 400 MG CPDR DR capsule, TAKE 2 CAPSULES DAILY, Disp: 180 capsule, Rfl: 3 .  metoprolol tartrate (LOPRESSOR) 25 MG tablet, Take 1 tablet (25 mg total) by mouth 2 (two) times daily. (Patient taking differently: Take 25 mg by mouth 2 (two) times daily. Take one table by mouth in the morning and 1/2 tablet in the evening), Disp: 180 tablet, Rfl: 3 .  MULTIPLE VITAMINS PO, Take 1 tablet by mouth daily. In the mornings, Disp: , Rfl:  .  Nutritional Supplements (GRAPESEED EXTRACT PO), Take 60 mg by mouth daily. , Disp: , Rfl:  .  OAT BRAN SOLUBLE PO, Take 1 tablet by mouth daily., Disp: , Rfl:  .  OVER THE COUNTER MEDICATION, Take 66 mg by mouth daily. Vision Gold Lutein, Disp: , Rfl:  .  OVER THE COUNTER MEDICATION, Take 250 mg by mouth daily. Alphalipoic Acid 250 mg daily, Disp: , Rfl:  .  pantoprazole (PROTONIX) 40 MG tablet, TAKE 1 TABLET DAILY BEFORE BREAKFAST (Patient taking differently: Take 40 mg by mouth every morning. ), Disp: 90 tablet, Rfl: 3 .  pyridOXINE (VITAMIN B-6) 100 MG tablet, Take 100 mg by mouth daily. , Disp: , Rfl:  .  Turmeric 500 MG CAPS, Take 1 capsule by mouth 3 (three) times daily. , Disp: , Rfl:  .  Vitamins C E (CRANBERRY CONCENTRATE PO), Take 2,500 mg by mouth daily. , Disp: , Rfl:  .  Zinc 50 MG CAPS, Take 50 mg by mouth daily. , Disp: , Rfl:   Assessment/ Plan: 84 y.o. female   1. DDD (degenerative disc disease), lumbar We will try to get physical therapy ordered for this patient  again.  I agree with the patient and think that avoiding pain medications is in her best interest.  She seems to really benefit from physical therapy.  I would like to keep her in this as long as possible. - Ambulatory referral to Physical Therapy  2. Scoliosis of thoracic spine, unspecified scoliosis type - Ambulatory referral to Physical Therapy  3. Leg cramping Possibly related to underlying DDD.  Will check iron, CBC, magnesium.  We reviewed that her potassium was within normal range. - Ferritin; Future - CBC; Future -  Magnesium, Future - Ambulatory referral to Physical Therapy  4. Nail abnormalities Uncertain etiology.  I encouraged her to use biotin containing vitamins for hair skin and nails.  This may help.  Sometimes a low iron or other vitamins can result in nail abnormalities but at this point I certainly do not think that it is related to her potassium and calcium, which were well within normal range. - Ferritin; Future - CBC; Future  5. Anemia, unspecified type Last several hemoglobins were within normal range but will evaluate for anemia given new onset leg cramping and history of anemia in the past. - Ferritin; Future - CBC; Future   Start time: 1:00pm (LVM); 1:19pm End time: 1:38pm  Total time spent on patient care (including telephone call/ virtual visit): 20 minutes  [place], [place] [phone]

## 2019-05-23 ENCOUNTER — Other Ambulatory Visit: Payer: Self-pay

## 2019-05-23 ENCOUNTER — Ambulatory Visit (INDEPENDENT_AMBULATORY_CARE_PROVIDER_SITE_OTHER): Payer: Medicare Other

## 2019-05-23 ENCOUNTER — Other Ambulatory Visit: Payer: Medicare Other

## 2019-05-23 ENCOUNTER — Encounter: Payer: Self-pay | Admitting: Family Medicine

## 2019-05-23 DIAGNOSIS — Z780 Asymptomatic menopausal state: Secondary | ICD-10-CM | POA: Diagnosis not present

## 2019-05-23 DIAGNOSIS — M8580 Other specified disorders of bone density and structure, unspecified site: Secondary | ICD-10-CM | POA: Diagnosis not present

## 2019-05-23 DIAGNOSIS — D649 Anemia, unspecified: Secondary | ICD-10-CM

## 2019-05-23 DIAGNOSIS — L609 Nail disorder, unspecified: Secondary | ICD-10-CM

## 2019-05-23 DIAGNOSIS — E559 Vitamin D deficiency, unspecified: Secondary | ICD-10-CM | POA: Diagnosis not present

## 2019-05-23 DIAGNOSIS — R252 Cramp and spasm: Secondary | ICD-10-CM

## 2019-05-24 LAB — CBC
Hematocrit: 39.8 % (ref 34.0–46.6)
Hemoglobin: 13.6 g/dL (ref 11.1–15.9)
MCH: 30 pg (ref 26.6–33.0)
MCHC: 34.2 g/dL (ref 31.5–35.7)
MCV: 88 fL (ref 79–97)
Platelets: 237 10*3/uL (ref 150–450)
RBC: 4.54 x10E6/uL (ref 3.77–5.28)
RDW: 12.8 % (ref 11.7–15.4)
WBC: 5.5 10*3/uL (ref 3.4–10.8)

## 2019-05-24 LAB — FERRITIN: Ferritin: 93 ng/mL (ref 15–150)

## 2019-05-24 LAB — MAGNESIUM: Magnesium: 1.8 mg/dL (ref 1.6–2.3)

## 2019-05-28 ENCOUNTER — Other Ambulatory Visit: Payer: Self-pay

## 2019-05-28 ENCOUNTER — Ambulatory Visit: Payer: Medicare Other | Attending: Family Medicine | Admitting: Physical Therapy

## 2019-05-28 ENCOUNTER — Encounter: Payer: Self-pay | Admitting: Physical Therapy

## 2019-05-28 DIAGNOSIS — M545 Low back pain, unspecified: Secondary | ICD-10-CM

## 2019-05-28 DIAGNOSIS — R293 Abnormal posture: Secondary | ICD-10-CM | POA: Diagnosis present

## 2019-05-28 DIAGNOSIS — G8929 Other chronic pain: Secondary | ICD-10-CM | POA: Diagnosis present

## 2019-05-28 NOTE — Therapy (Signed)
[facility] [place], [place], [address] Phone: [phone]   Fax:  [phone]  Physical Therapy Evaluation  Patient Details  Name: Lori Soto MRN: [id number] Date of Birth: 06/23/1935 Referring Provider (PT): Ronnie Doss DO.   Encounter Date: 05/28/2019  PT End of Session - 05/28/19 1249    Visit Number  1    Number of Visits  12    Date for PT Re-Evaluation  08/26/19    PT Start Time  1119    PT Stop Time  1158    PT Time Calculation (min)  39 min       Past Medical History:  Diagnosis Date  . Anemia   . Arthritis   . Cancer ([place])    skin cancer on nose  . Carpal tunnel syndrome, bilateral   . Cataract   . Esophagitis   . GERD (gastroesophageal reflux disease)   . H/O hiatal hernia   . Osteopenia   . Scoliosis   . Shingles   . SVT (supraventricular tachycardia) ([place])   . Ulcerative colitis     Past Surgical History:  Procedure Laterality Date  . ABDOMINAL HYSTERECTOMY    . BUNIONECTOMY     Bilateral  . CARPAL TUNNEL RELEASE Left 03/10/2015   Procedure: LEFT CARPAL TUNNEL RELEASE;  Surgeon: Daryll Brod, MD;  Location: [place];  Service: Orthopedics;  Laterality: Left;  . CARPAL TUNNEL RELEASE Right 10/31/2017   Procedure: RIGHT CARPAL TUNNEL RELEASE;  Surgeon: Daryll Brod, MD;  Location: [place];  Service: Orthopedics;  Laterality: Right;  . CATARACT EXTRACTION W/PHACO  05/28/2012   Procedure: CATARACT EXTRACTION PHACO AND INTRAOCULAR LENS PLACEMENT (IOC);  Surgeon: Tonny Branch, MD;  [facility];  Service: Ophthalmology;  Laterality: Right;  CDE=12.84  . CATARACT EXTRACTION W/PHACO  06/07/2012   Procedure: CATARACT EXTRACTION PHACO AND INTRAOCULAR LENS PLACEMENT (IOC);  Surgeon: Tonny Branch, MD;  [facility];  Service: Ophthalmology;  Laterality: Left;  CDE 17.60  . COLONOSCOPY    . COLONOSCOPY N/A 07/08/2015   Procedure: COLONOSCOPY;  Surgeon: Rogene Houston, MD;  [facility];  Service: Endoscopy;  Laterality: N/A;  930  . ESOPHAGEAL DILATION N/A 07/08/2015   Procedure: ESOPHAGEAL DILATION;  Surgeon: Rogene Houston, MD;  [facility];  Service: Endoscopy;  Laterality: N/A;  . ESOPHAGOGASTRODUODENOSCOPY N/A 07/08/2015   Procedure: ESOPHAGOGASTRODUODENOSCOPY (EGD);  Surgeon: Rogene Houston, MD;  [facility];  Service: Endoscopy;  Laterality: N/A;  . Hernia     Right inguinal  . HERNIA REPAIR  1961   right inguinal hernia  . UPPER GASTROINTESTINAL ENDOSCOPY      There were no vitals filed for this visit.   Subjective Assessment - 05/28/19 1226    Subjective  COVID-19 screen performed prior to patient entering clinic.  Patient returns to the clinic after a several week lay off.  Patient states PT was helpful but temporary.  Patient shared recent bone scan results with a T-score of -2.5.  Discussed with patient thta it would be in her best interest to establish a very comprehensive ther ex program wo include weight bearing exercises.    Pertinent History  Scoliosis, hiatial hernia, osteopenia, allergy to tape    Limitations  Sitting;House hold activities;Standing;Walking    How long can you stand comfortably?  20 minutes    How long can you walk comfortably?  20-30 minutes max    Diagnostic  tests  x-ray    Patient Stated Goals  improve ability to perform home activities with less pain    Currently in Pain?  Yes    Pain Score  5     Pain Location  Back    Pain Orientation  Right    Pain Descriptors / Indicators  Discomfort    Pain Onset  More than a month ago    Pain Frequency  Constant         [facility] PT Assessment - 05/28/19 0001      Assessment   Medical Diagnosis  DDD, lumbar.    Referring Provider (PT)  Ronnie Doss DO.    Onset Date/Surgical Date  --   Ongoing.     Precautions   Precautions  --   T-score -2.5     Restrictions   Weight Bearing Restrictions  No      Balance Screen    Has the patient fallen in the past 6 months  No    Has the patient had a decrease in activity level because of a fear of falling?   No    Is the patient reluctant to leave their home because of a fear of falling?   No      Home Environment   Living Environment  Private residence      Posture/Postural Control   Posture/Postural Control  Postural limitations    Postural Limitations  Rounded Shoulders;Forward head;Decreased lumbar lordosis;Decreased thoracic kyphosis;Flexed trunk;Weight shift left    Posture Comments  Thoraco-lumbar scoliosis.      ROM / Strength   AROM / PROM / Strength  AROM      AROM   Overall AROM Comments  Patient exhibiting full lumbar flexion today though her active extension is limited to 5 degrees.      Flexibility   Soft Tissue Assessment /Muscle Length  yes    Hamstrings  Normal.      Palpation   Palpation comment  Patient's CC is tenderness and severe tone in her right lower lumbar region.      Special Tests   Other special tests  (=) leg lengths.      Ambulation/Gait   Gait Comments  The patient walks in a left trunk lean posture.                Objective measurements completed on examination: See above findings.                   PT Long Term Goals - 05/28/19 1248      PT LONG TERM GOAL #1   Title  Patient will be independent with an advanced HEP    Time  6    Period  Weeks    Status  New      PT LONG TERM GOAL #2   Title  Patient will report ability to perfrom ADLs with low back pain less than or equal to 4/10    Time  12    Period  Weeks    Status  New             Plan - 05/28/19 1243    Clinical Impression Statement  The patient presents to OPPT with continued c/o right sided low back pain.  She has multiple postural abnormalites due to scoliosis.  We discussed in detail the importance to establishing a comprehensive ther ex program with emphasis on weight bearing exercises.    Personal Factors and  Comorbidities  Comorbidity 1;Comorbidity 2    Comorbidities  Scoliosis, hiatial hernia, osteopenia (T-score -2.5), allergy to tape    Stability/Clinical Decision Making  Evolving/Moderate complexity    Clinical Decision Making  Moderate    Rehab Potential  Fair    PT Frequency  1x / week    PT Duration  12 weeks    PT Treatment/Interventions  ADLs/Self Care Home Management;Functional mobility training;Therapeutic activities;Therapeutic exercise    PT Next Visit Plan  Comprehensive ther ex program to include stretching, postural exercises, PRE exercises, core exercise program, neuro re-edu activities.    Consulted and Agree with Plan of Care  Patient       Patient will benefit from skilled therapeutic intervention in order to improve the following deficits and impairments:  Abnormal gait, Pain, Postural dysfunction, Decreased activity tolerance, Decreased range of motion, Decreased strength  Visit Diagnosis: Abnormal posture  Chronic bilateral low back pain without sciatica     Problem List Patient Active Problem List   Diagnosis Date Noted  . SVT (supraventricular tachycardia) ([place]) 08/28/2018  . Elevated troponin 08/28/2018  . Light headedness 05/09/2018  . Osteopenia of the elderly 02/19/2014  . DDD (degenerative disc disease), lumbar 12/03/2013  . Scoliosis 12/03/2013  . Chronic LBP 05/14/2013  . IBS (irritable bowel syndrome) 11/08/2012  . UC (ulcerative colitis) ([place]) 04/23/2012  . Palpitation 10/26/2011  . Hypertension 10/26/2011  . Chronic ulcerative colitis ([place]) 03/23/2011    Mosie Angus, [place] MPT 05/28/2019, 12:50 PM  [facility] [address] [place], [place], [address] Phone: [phone]   Fax:  [phone]  Name: Lori Soto MRN: [id number] Date of Birth: 12/29/1935

## 2019-06-04 DIAGNOSIS — Z7189 Other specified counseling: Secondary | ICD-10-CM | POA: Insufficient documentation

## 2019-06-04 NOTE — Progress Notes (Signed)
Cardiology Office Note   Date:  06/05/2019   ID:  Lori Soto, DOB 1935-08-08, MRN [id number]  PCP:  [name], DO  Cardiologist:   Minus Breeding, MD   Chief Complaint  Patient presents with  . Palpitations      History of Present Illness: Echo Lori Soto is a 84 y.o. female who presents for evaluation of palpitations.   I saw her for evaluation of palpitations in 2014.  She has a heart murmur and an echo in 2011 was normal.  She was in in theED in August.2018but there was no recorded arrhythmia. She had palpitations but these were infrequent so no further work up was suggested.   She was in the ED earlier this year twice with SVT. She had mildly elevated troponin. She had an SVT with a rate of 174She was treated with adenosine. After the second visit she wasgiven a prescription for metoprolol 25 mg twice daily but she did not want to take this until checking with [place]. I talked to her about titrating the beta blocker and the possibility of ablation.  She wanted to try medications.   Since I last saw her she has had no new tachypalpitations.  She has had some hot flashes.  She has had a little dyspnea with activity such as vacuuming or walking a long distance.  She is not reporting any resting shortness of breath, PND or orthopnea.  She said no presyncope or syncope.  She denies any chest discomfort.   Past Medical History:  Diagnosis Date  . Anemia   . Arthritis   . Cancer ([place])    skin cancer on nose  . Carpal tunnel syndrome, bilateral   . Cataract   . Esophagitis   . GERD (gastroesophageal reflux disease)   . H/O hiatal hernia   . Osteopenia   . Scoliosis   . Shingles   . SVT (supraventricular tachycardia) ([place])   . Ulcerative colitis     Past Surgical History:  Procedure Laterality Date  . ABDOMINAL HYSTERECTOMY    . BUNIONECTOMY     Bilateral  . CARPAL TUNNEL RELEASE Left 03/10/2015   Procedure: LEFT CARPAL TUNNEL RELEASE;  Surgeon:  Daryll Brod, MD;  Location: [place];  Service: Orthopedics;  Laterality: Left;  . CARPAL TUNNEL RELEASE Right 10/31/2017   Procedure: RIGHT CARPAL TUNNEL RELEASE;  Surgeon: Daryll Brod, MD;  Location: [place];  Service: Orthopedics;  Laterality: Right;  . CATARACT EXTRACTION W/PHACO  05/28/2012   Procedure: CATARACT EXTRACTION PHACO AND INTRAOCULAR LENS PLACEMENT (IOC);  Surgeon: Tonny Branch, MD;  [facility];  Service: Ophthalmology;  Laterality: Right;  CDE=12.84  . CATARACT EXTRACTION W/PHACO  06/07/2012   Procedure: CATARACT EXTRACTION PHACO AND INTRAOCULAR LENS PLACEMENT (IOC);  Surgeon: Tonny Branch, MD;  [facility];  Service: Ophthalmology;  Laterality: Left;  CDE 17.60  . COLONOSCOPY    . COLONOSCOPY N/A 07/08/2015   Procedure: COLONOSCOPY;  Surgeon: Rogene Houston, MD;  [facility];  Service: Endoscopy;  Laterality: N/A;  930  . ESOPHAGEAL DILATION N/A 07/08/2015   Procedure: ESOPHAGEAL DILATION;  Surgeon: Rogene Houston, MD;  [facility];  Service: Endoscopy;  Laterality: N/A;  . ESOPHAGOGASTRODUODENOSCOPY N/A 07/08/2015   Procedure: ESOPHAGOGASTRODUODENOSCOPY (EGD);  Surgeon: Rogene Houston, MD;  [facility];  Service: Endoscopy;  Laterality: N/A;  . Hernia     Right inguinal  . [place]  right inguinal hernia  . UPPER GASTROINTESTINAL ENDOSCOPY       Current Outpatient Medications  Medication Sig Dispense Refill  . acetaminophen (TYLENOL 8 HOUR ARTHRITIS PAIN) 650 MG CR tablet Take 650 mg by mouth every 8 (eight) hours as needed. Patient states that she takes prn for arthritis in her knees.     . Bromelains 500 MG TABS Take 500 mg by mouth daily.    [name] 500 MG CAPS Take 1 capsule by mouth daily.     . Coenzyme Q10 (CO Q-10 PO) Take 1 tablet by mouth daily. With red yeast rice [employer]    . COLLAGEN PO Take 1 tablet by mouth daily. This is known as 1-2-3    . Cyanocobalamin  (VITAMIN B 12 PO) Take 1,000 mcg by mouth daily.     . Ginkgo Biloba 40 MG TABS Take 40 mg by mouth daily.     [name] glucosamine-chondroitin 500-400 MG tablet Take 1 tablet by mouth 2 (two) times daily.      [name] Hyaluronic Acid-Vitamin C (HYALURONIC ACID PO) Take 80 mg by mouth daily.    . Mesalamine (ASACOL) 400 MG CPDR DR capsule TAKE 2 CAPSULES DAILY 180 capsule 3  . metoprolol tartrate (LOPRESSOR) 25 MG tablet Take 1 tablet (25 mg total) by mouth 2 (two) times daily. (Patient taking differently: Take 25 mg by mouth 2 (two) times daily. Take one table by mouth in the morning and 1/2 tablet in the evening) 180 tablet 3  . MULTIPLE VITAMINS PO Take 1 tablet by mouth daily. In the mornings    . Nutritional Supplements (GRAPESEED EXTRACT PO) Take 60 mg by mouth daily.     [name] OAT BRAN SOLUBLE PO Take 1 tablet by mouth daily.    [name] OVER THE COUNTER MEDICATION Take 66 mg by mouth daily. Vision Gold Lutein    . OVER THE COUNTER MEDICATION Take 250 mg by mouth daily. Alphalipoic Acid 250 mg daily    . pantoprazole (PROTONIX) 40 MG tablet TAKE 1 TABLET DAILY BEFORE BREAKFAST (Patient taking differently: Take 40 mg by mouth every morning. ) 90 tablet 3  . pyridOXINE (VITAMIN B-6) 100 MG tablet Take 100 mg by mouth daily.     . Turmeric 500 MG CAPS Take 1 capsule by mouth 3 (three) times daily.     . Vitamins C E (CRANBERRY CONCENTRATE PO) Take 2,500 mg by mouth daily.     . Zinc 50 MG CAPS Take 50 mg by mouth daily.     . Calcium Carbonate-Vitamin D (CALCIUM 600+D) 600-400 MG-UNIT per tablet Take 1 tablet by mouth every evening. (Patient not taking: Reported on 06/05/2019)     No current facility-administered medications for this visit.    Allergies:   Penicillins, Tape, and Influenza virus vacc split pf    ROS:  Please see the history of present illness.   Otherwise, review of systems are positive for none.   All other systems are reviewed and negative.    PHYSICAL EXAM: VS:  BP (!) 150/80   Pulse (!) 56    Ht 5' (1.524 m)   Wt 134 lb (60.8 kg)   BMI 26.17 kg/m  , BMI Body mass index is 26.17 kg/m. GENERAL:  Well appearing NECK:  No jugular venous distention, waveform within normal limits, carotid upstroke brisk and symmetric, no bruits, no thyromegaly LUNGS:  Clear to auscultation bilaterally CHEST:  Unremarkable HEART:  PMI not displaced or sustained,S1 and S2  within normal limits, no S3, no S4, no clicks, no rubs, no murmurs ABD:  Flat, positive bowel sounds normal in frequency in pitch, no bruits, no rebound, no guarding, no midline pulsatile mass, no hepatomegaly, no splenomegaly EXT:  2 plus pulses throughout, no edema, no cyanosis no clubbing   EKG:  EKG is not ordered today.    Recent Labs: 05/17/2019: ALT 16; BUN 9; Creatinine, Ser 0.78; Potassium 4.6; Sodium 144; TSH 1.940 05/23/2019: Hemoglobin 13.6; Magnesium 1.8; Platelets 237    Lipid Panel    Component Value Date/Time   CHOL 184 05/17/2019 1025   TRIG 49 05/17/2019 1025   TRIG 79 12/18/2015 1105   HDL 64 05/17/2019 1025   HDL 65 12/18/2015 1105   CHOLHDL 2.9 05/17/2019 1025   LDLCALC 110 (H) 05/17/2019 1025   LDLCALC 109 (H) 11/29/2013 0822      Wt Readings from Last 3 Encounters:  06/05/19 134 lb (60.8 kg)  12/04/18 131 lb (59.4 kg)  11/14/18 131 lb (59.4 kg)      Other studies Reviewed: Additional studies/ records that were reviewed today include: Labs. Review of the above records demonstrates:  Please see elsewhere in the note.     ASSESSMENT AND PLAN:  SVT: She is had no further arrhythmias.  No change in therapy.  No indication for ablation at this point.  COVID EDUCATION: She says she is allergic to the flu and cannot take the vaccine.  HTN: Her blood pressure is elevated today but this is very unusual.  She brings me a diary were it is usually well controlled.    Current medicines are reviewed at length with the patient today.  The patient does not have concerns regarding  medicines.  The following changes have been made:  no change  Labs/ tests ordered today include: None No orders of the defined types were placed in this encounter.    Disposition:   FU with me in one year.     Signed, Minus Breeding, MD  06/05/2019 5:31 PM    [facility]

## 2019-06-05 ENCOUNTER — Ambulatory Visit: Payer: Medicare Other | Attending: Family Medicine | Admitting: Physical Therapy

## 2019-06-05 ENCOUNTER — Ambulatory Visit (INDEPENDENT_AMBULATORY_CARE_PROVIDER_SITE_OTHER): Payer: Medicare Other | Admitting: Cardiology

## 2019-06-05 ENCOUNTER — Encounter: Payer: Self-pay | Admitting: Physical Therapy

## 2019-06-05 ENCOUNTER — Encounter (INDEPENDENT_AMBULATORY_CARE_PROVIDER_SITE_OTHER): Payer: Self-pay

## 2019-06-05 ENCOUNTER — Encounter: Payer: Self-pay | Admitting: Cardiology

## 2019-06-05 ENCOUNTER — Other Ambulatory Visit: Payer: Self-pay

## 2019-06-05 VITALS — BP 150/80 | HR 56 | Ht 60.0 in | Wt 134.0 lb

## 2019-06-05 DIAGNOSIS — M546 Pain in thoracic spine: Secondary | ICD-10-CM | POA: Insufficient documentation

## 2019-06-05 DIAGNOSIS — R293 Abnormal posture: Secondary | ICD-10-CM | POA: Diagnosis present

## 2019-06-05 DIAGNOSIS — R7989 Other specified abnormal findings of blood chemistry: Secondary | ICD-10-CM

## 2019-06-05 DIAGNOSIS — R778 Other specified abnormalities of plasma proteins: Secondary | ICD-10-CM

## 2019-06-05 DIAGNOSIS — I471 Supraventricular tachycardia, unspecified: Secondary | ICD-10-CM

## 2019-06-05 DIAGNOSIS — Z7189 Other specified counseling: Secondary | ICD-10-CM | POA: Diagnosis not present

## 2019-06-05 DIAGNOSIS — G8929 Other chronic pain: Secondary | ICD-10-CM

## 2019-06-05 DIAGNOSIS — M6281 Muscle weakness (generalized): Secondary | ICD-10-CM | POA: Diagnosis present

## 2019-06-05 DIAGNOSIS — M545 Low back pain: Secondary | ICD-10-CM | POA: Insufficient documentation

## 2019-06-05 NOTE — Patient Instructions (Signed)
Medication Instructions:  The current medical regimen is effective;  continue present plan and medications.  *If you need a refill on your cardiac medications before your next appointment, please call your pharmacy*  Follow-Up: At [facility], you and your health needs are our priority.  As part of our continuing mission to provide you with exceptional heart care, we have created designated Provider Care Teams.  These Care Teams include your primary Cardiologist (physician) and Advanced Practice Providers (APPs -  Physician Assistants and Nurse Practitioners) who all work together to provide you with the care you need, when you need it.  Your next appointment:   12 month(s)  The format for your next appointment:   In Person  Provider:   Minus Breeding, MD  Thank you for choosing [facility]!!

## 2019-06-05 NOTE — Therapy (Signed)
[facility] [place], [place], [address] Phone: [phone]   Fax:  [phone]  Physical Therapy Treatment  Patient Details  Name: Lori Soto MRN: [id number] Date of Birth: [birth year] Referring Provider (PT): Ronnie Doss DO.   Encounter Date: 06/05/2019  PT End of Session - 06/05/19 1449    Visit Number  2    Number of Visits  12    Date for PT Re-Evaluation  08/26/19    PT Start Time  9702    PT Stop Time  1439    PT Time Calculation (min)  50 min    Activity Tolerance  Patient tolerated treatment well    Behavior During Therapy  [facility] for tasks assessed/performed       Past Medical History:  Diagnosis Date  . Anemia   . Arthritis   . Cancer ([place])    skin cancer on nose  . Carpal tunnel syndrome, bilateral   . Cataract   . Esophagitis   . GERD (gastroesophageal reflux disease)   . H/O hiatal hernia   . Osteopenia   . Scoliosis   . Shingles   . SVT (supraventricular tachycardia) ([place])   . Ulcerative colitis     Past Surgical History:  Procedure Laterality Date  . ABDOMINAL HYSTERECTOMY    . BUNIONECTOMY     Bilateral  . CARPAL TUNNEL RELEASE Left 03/10/2015   Procedure: LEFT CARPAL TUNNEL RELEASE;  Surgeon: Daryll Brod, MD;  Location: [place];  Service: Orthopedics;  Laterality: Left;  . CARPAL TUNNEL RELEASE Right 10/31/2017   Procedure: RIGHT CARPAL TUNNEL RELEASE;  Surgeon: Daryll Brod, MD;  Location: [place];  Service: Orthopedics;  Laterality: Right;  . CATARACT EXTRACTION W/PHACO  05/28/2012   Procedure: CATARACT EXTRACTION PHACO AND INTRAOCULAR LENS PLACEMENT (IOC);  Surgeon: Tonny Branch, MD;  [facility];  Service: Ophthalmology;  Laterality: Right;  CDE=12.84  . CATARACT EXTRACTION W/PHACO  06/07/2012   Procedure: CATARACT EXTRACTION PHACO AND INTRAOCULAR LENS PLACEMENT (IOC);  Surgeon: Tonny Branch, MD;  [facility];  Service: Ophthalmology;   Laterality: Left;  CDE 17.60  . COLONOSCOPY    . COLONOSCOPY N/A 07/08/2015   Procedure: COLONOSCOPY;  Surgeon: Rogene Houston, MD;  [facility];  Service: Endoscopy;  Laterality: N/A;  930  . ESOPHAGEAL DILATION N/A 07/08/2015   Procedure: ESOPHAGEAL DILATION;  Surgeon: Rogene Houston, MD;  [facility];  Service: Endoscopy;  Laterality: N/A;  . ESOPHAGOGASTRODUODENOSCOPY N/A 07/08/2015   Procedure: ESOPHAGOGASTRODUODENOSCOPY (EGD);  Surgeon: Rogene Houston, MD;  [facility];  Service: Endoscopy;  Laterality: N/A;  . Hernia     Right inguinal  . HERNIA REPAIR  1961   right inguinal hernia  . UPPER GASTROINTESTINAL ENDOSCOPY      There were no vitals filed for this visit.  Subjective Assessment - 06/05/19 1349    Subjective  COVID-19 screen performed prior to patient entering clinic.  Patient reports that she can self correct with leaning while walking. Reports that she is going to look into having another bone scan done as she has resumed calcium supplement.    Pertinent History  Scoliosis, hiatial hernia, osteopenia, allergy to tape    Limitations  Sitting;House hold activities;Standing;Walking    How long can you stand comfortably?  20 minutes    How long can you walk comfortably?  20-30 minutes max    Diagnostic tests  x-ray  Patient Stated Goals  improve ability to perform home activities with less pain    Currently in Pain?  Yes    Pain Score  8     Pain Location  Back    Pain Orientation  Right;Lower    Pain Descriptors / Indicators  Discomfort    Pain Type  Chronic pain    Pain Onset  More than a month ago    Pain Frequency  Constant         [facility] PT Assessment - 06/05/19 0001      Assessment   Medical Diagnosis  DDD, lumbar.    Referring Provider (PT)  Ronnie Doss DO.      Restrictions   Weight Bearing Restrictions  No                   [facility] Adult PT Treatment/Exercise - 06/05/19 0001      Exercises    Exercises  Lumbar      Lumbar Exercises: Aerobic   Nustep  L1 x13 min      Lumbar Exercises: Machines for Strengthening   Cybex Lumbar Extension  60# x20 reps      Lumbar Exercises: Standing   Row  Strengthening;Both;20 reps   Green XTS     Modalities   Modalities  Electrical Stimulation;Moist Heat      Moist Heat Therapy   Number Minutes Moist Heat  15 Minutes    Moist Heat Location  Lumbar Spine      Electrical Stimulation   Electrical Stimulation Location  Bil low back.    Electrical Stimulation Action  IFC    Electrical Stimulation Parameters  80-150 hz x15 min    Electrical Stimulation Goals  Pain;Tone      Manual Therapy   Manual Therapy  Soft tissue mobilization    Soft tissue mobilization  STW to R QL, lumbar paraspinals, superior glute to reduce pain and tone                  PT Long Term Goals - 05/28/19 1248      PT LONG TERM GOAL #1   Title  Patient will be independent with an advanced HEP    Time  6    Period  Weeks    Status  New      PT LONG TERM GOAL #2   Title  Patient will report ability to perfrom ADLs with low back pain less than or equal to 4/10    Time  12    Period  Weeks    Status  New            Plan - 06/05/19 1450    Clinical Impression Statement  Patient presented in clinic with increased LBP but continues to be limited with activity due to LBP. Patient understanding that due to low T score than traction is not a treatment option. Patient guided through postural strengthening exercises with verbal and tactile cueing to correct standing posture. Patient somewhat limited with therex due to knee pain. Continued increased R QL tone palpable. Normal modalities response noted following removal of the modalties.    Personal Factors and Comorbidities  Comorbidity 1;Comorbidity 2    Comorbidities  Scoliosis, hiatial hernia, osteopenia (T-score -2.5), allergy to tape    Stability/Clinical Decision Making  Evolving/Moderate  complexity    Rehab Potential  Fair    PT Frequency  1x / week    PT Duration  12 weeks  PT Treatment/Interventions  ADLs/Self Care Home Management;Functional mobility training;Therapeutic activities;Therapeutic exercise    PT Next Visit Plan  Comprehensive ther ex program to include stretching, postural exercises, PRE exercises, core exercise program, neuro re-edu activities.    PT Home Exercise Plan  see patient education section    Consulted and Agree with Plan of Care  Patient       Patient will benefit from skilled therapeutic intervention in order to improve the following deficits and impairments:  Abnormal gait, Pain, Postural dysfunction, Decreased activity tolerance, Decreased range of motion, Decreased strength  Visit Diagnosis: Abnormal posture  Chronic bilateral low back pain without sciatica     Problem List Patient Active Problem List   Diagnosis Date Noted  . Educated about COVID-19 virus infection 06/04/2019  . SVT (supraventricular tachycardia) ([place]) 08/28/2018  . Elevated troponin 08/28/2018  . Light headedness 05/09/2018  . Osteopenia of the elderly 02/19/2014  . DDD (degenerative disc disease), lumbar 12/03/2013  . Scoliosis 12/03/2013  . Chronic LBP 05/14/2013  . IBS (irritable bowel syndrome) 11/08/2012  . UC (ulcerative colitis) ([place]) 04/23/2012  . Palpitation 10/26/2011  . Hypertension 10/26/2011  . Chronic ulcerative colitis ([place]) 03/23/2011    Standley Brooking, PTA 06/05/2019, 2:53 PM  [facility] [address] [place], [place], [address] Phone: [phone]   Fax:  [phone]  Name: Lori Soto MRN: [id number] Date of Birth: 1936-03-18

## 2019-06-10 ENCOUNTER — Other Ambulatory Visit: Payer: Self-pay

## 2019-06-10 ENCOUNTER — Ambulatory Visit (INDEPENDENT_AMBULATORY_CARE_PROVIDER_SITE_OTHER): Payer: Medicare Other | Admitting: Internal Medicine

## 2019-06-10 ENCOUNTER — Encounter (INDEPENDENT_AMBULATORY_CARE_PROVIDER_SITE_OTHER): Payer: Self-pay | Admitting: Internal Medicine

## 2019-06-10 VITALS — BP 140/79 | HR 60 | Temp 97.4°F | Ht 62.0 in | Wt 133.5 lb

## 2019-06-10 DIAGNOSIS — K219 Gastro-esophageal reflux disease without esophagitis: Secondary | ICD-10-CM | POA: Diagnosis not present

## 2019-06-10 DIAGNOSIS — K222 Esophageal obstruction: Secondary | ICD-10-CM | POA: Insufficient documentation

## 2019-06-10 DIAGNOSIS — K5100 Ulcerative (chronic) pancolitis without complications: Secondary | ICD-10-CM

## 2019-06-10 NOTE — Progress Notes (Signed)
Presenting complaint;  Follow-up for chronic ulcerative colitis and GERD.  Database and subjective:  Patient is 84 year old Caucasian female who has over 20-year history of ulcerative colitis and chronic GERD complicated by esophageal stricture and a moderate-sized sliding hiatal hernia who is here for scheduled visit.  She was last seen on 12/04/2018. Last EGD and colonoscopy were not March 2017. EGD revealed grade A reflux esophagitis stricture at GE junction and moderate-sized sliding hiatal hernia.  Biopsy from GE junction showed changes of GERD but no Barrett's.  The stricture was dilated to 18 mm. Colonoscopy revealed cecal polyp which was a tubular adenoma and sigmoid colon polyp was a leiomyoma.  She says she is doing well from GI standpoint.  She has not been taking pantoprazole daily.  She generally checks 3 times a week.  She may have 2-3 episodes of heartburn in a month and these generally occur with certain foods and when she is upset.  Head end of bed is elevated and she never experiences regurgitation.  She has difficulty swallowing pills when she swallows too many at a time and when she eats food without chewing it thoroughly.  She has not had any episode of food impaction.  Her appetite is good but she is watching her diet.  She has not lost any weight since her last visit.  Her bowels move daily.  She generally has formed stool.  If she is too much chocolate she may get constipated.  She denies abdominal pain melena or rectal bleeding.  She has noted exertional dyspnea when she walks to the mailbox.  She does not experience any chest pain.  She did see her cardiologist Dr. Percival Spanish and he felt she was doing good from cardiac standpoint. Patient says she had bone density study last month and her osteopenia has progressed and she almost does develop osteoporosis(T score was -2.4 at femur neck) she says she had not been taking calcium because last year her serum calcium was high normal but  now she is back on it.   Current Medications: Outpatient Encounter Medications as of 06/10/2019  Medication Sig  . acetaminophen (TYLENOL 8 HOUR ARTHRITIS PAIN) 650 MG CR tablet Take 650 mg by mouth every 8 (eight) hours as needed. Patient states that she takes prn for arthritis in her knees.   . Bromelains 500 MG TABS Take 500 mg by mouth daily.  . Calcium Carbonate-Vitamin D (CALCIUM 600+D) 600-400 MG-UNIT per tablet Take 1 tablet by mouth every evening.  [name] 500 MG CAPS Take 1 capsule by mouth daily.   . Coenzyme Q10 (CO Q-10 PO) Take 1 tablet by mouth daily. With red yeast rice [employer]  . COLLAGEN PO Take 1 tablet by mouth daily. This is known as 1-2-3  . Cyanocobalamin (VITAMIN B 12 PO) Take 1,000 mcg by mouth daily.   . Ginkgo Biloba 40 MG TABS Take 40 mg by mouth daily.   [name] glucosamine-chondroitin 500-400 MG tablet Take 1 tablet by mouth 2 (two) times daily.    [name] Hyaluronic Acid-Vitamin C (HYALURONIC ACID PO) Take 80 mg by mouth daily.  . Mesalamine (ASACOL) 400 MG CPDR DR capsule TAKE 2 CAPSULES DAILY  . metoprolol tartrate (LOPRESSOR) 25 MG tablet Take 1 tablet (25 mg total) by mouth 2 (two) times daily. (Patient taking differently: Take 25 mg by mouth 2 (two) times daily. Take one table by mouth in the morning and 1/2 tablet in the evening)  . MULTIPLE VITAMINS PO Take 1  tablet by mouth daily. In the mornings  . Nutritional Supplements (GRAPESEED EXTRACT PO) Take 60 mg by mouth daily.   [name] OAT BRAN SOLUBLE PO Take 1 tablet by mouth daily.  [name] OVER THE COUNTER MEDICATION Take 66 mg by mouth daily. Vision Gold Lutein  . OVER THE COUNTER MEDICATION Take 250 mg by mouth daily. Alphalipoic Acid 250 mg daily  . pantoprazole (PROTONIX) 40 MG tablet TAKE 1 TABLET DAILY BEFORE BREAKFAST (Patient taking differently: Take 40 mg by mouth every morning. )  . pyridOXINE (VITAMIN B-6) 100 MG tablet Take 100 mg by mouth daily.   . Turmeric 500 MG CAPS Take 1 capsule by mouth 3  (three) times daily.   . Vitamins C E (CRANBERRY CONCENTRATE PO) Take 2,500 mg by mouth daily.   . Zinc 50 MG CAPS Take 50 mg by mouth daily.    No facility-administered encounter medications on file as of 06/10/2019.     Objective: Blood pressure 140/79, pulse 60, temperature (!) 97.4 F (36.3 C), temperature source Temporal, height _0  (1.575 m), weight 133 lb 8 oz (60.6 kg). Patient is alert and in no acute distress Patient is wearing a mask. Conjunctiva is pink. Sclera is nonicteric Oropharyngeal mucosa is normal. She has partial upper and lower dental plates. No neck masses or thyromegaly noted. Cardiac exam with regular rhythm normal S1 and S2. No murmur or gallop noted. Lungs are clear to auscultation. Abdomen is symmetrical soft and nontender with organomegaly or masses. No LE edema or clubbing noted.  Labs/studies Results:  CBC Latest Ref Rng & Units 05/23/2019 10/23/2018 08/26/2018  WBC 3.4 - 10.8 x10E3/uL 5.5 5.8 8.4  Hemoglobin 11.1 - 15.9 g/dL 13.6 13.7 15.0  Hematocrit 34.0 - 46.6 % 39.8 40.1 45.7  Platelets 150 - 450 x10E3/uL 237 256 184    CMP Latest Ref Rng & Units 05/17/2019 11/23/2018 11/14/2018  Glucose 65 - 99 mg/dL 87 80 80  BUN 8 - 27 mg/dL _1 Creatinine 0.57 - 1.00 mg/dL 0.78 0.86 0.83  Sodium 134 - 144 mmol/L 144 141 143  Potassium 3.5 - 5.2 mmol/L 4.6 4.9 5.6(H)  Chloride 96 - 106 mmol/L 104 101 100  CO2 20 - 29 mmol/L _2 Calcium 8.7 - 10.3 mg/dL 9.9 9.8 10.2  Total Protein 6.0 - 8.5 g/dL 6.9 - -  Total Bilirubin 0.0 - 1.2 mg/dL 0.5 - -  Alkaline Phos 39 - 117 IU/L 45 - -  AST 0 - 40 IU/L 21 - -  ALT 0 - 32 IU/L 16 - -    Hepatic Function Latest Ref Rng & Units 05/17/2019 10/23/2018 08/26/2018  Total Protein 6.0 - 8.5 g/dL 6.9 7.0 7.8  Albumin 3.6 - 4.6 g/dL 4.3 4.5 4.7  AST 0 - 40 IU/L _3 ALT 0 - 32 IU/L _4 Alk Phosphatase 39 - 117 IU/L 45 37(L) 36(L)  Total Bilirubin 0.0 - 1.2 mg/dL 0.5 0.5 0.7  Bilirubin, Direct 0.00 -  0.40 mg/dL - 0.14 -    Bone density study results noted.  Date performed 05/22/2018  T- score at left femoral neck is -2.4%.  It was -0.5% on 03/30/2016 and -1.1 on 02/19/2014.    Assessment:  #1.  Chronic ulcerative colitis.  She remains in clinical and endoscopic remission.  Last colonoscopy was in March 2017.  She will continue oral mesalamine at current dose.  CBC and LFTs are normal.  #2.  Chronic  GERD complicated by distal esophageal stricture last dilated in March 2017.  Symptoms are well controlled with dietary measures and a PPI which she is not taking on schedule.  As PPI may have adverse effect on bone density it would be reasonable to change PPI therapy to every other day.  #3.  History of cecal tubular adenoma.  Last colonoscopy was in March 2017.  She will be 84 years old by the time it is 5 years from the last study.  Will discuss next year with her not she should have any more colonoscopies.  Plan:  Patient advised to take pantoprazole 40 mg by mouth 30 minutes before breakfast every other day. She can take Pepcid/famotidine OTC 20 mg on as-needed basis on days when she does not take pantoprazole. Patient advised to call office if heartburn is not controlled with every other day PPI or if she experiences dysphagia. Office visit in 6 months.

## 2019-06-10 NOTE — Patient Instructions (Signed)
Try pantoprazole 40 mg by mouth every other day.  Take it 30 minutes before breakfast. Can take Pepcid/famotidine OTC 20 mg on as-needed basis on days when you do not take pantoprazole. Notify if you have swallowing difficulty.

## 2019-06-11 ENCOUNTER — Ambulatory Visit: Payer: Medicare Other | Admitting: Physical Therapy

## 2019-06-11 ENCOUNTER — Encounter: Payer: Self-pay | Admitting: Physical Therapy

## 2019-06-11 ENCOUNTER — Ambulatory Visit: Payer: Medicare Other | Admitting: Family Medicine

## 2019-06-11 ENCOUNTER — Ambulatory Visit (INDEPENDENT_AMBULATORY_CARE_PROVIDER_SITE_OTHER): Payer: Medicare Other | Admitting: Internal Medicine

## 2019-06-11 DIAGNOSIS — G8929 Other chronic pain: Secondary | ICD-10-CM

## 2019-06-11 DIAGNOSIS — R293 Abnormal posture: Secondary | ICD-10-CM

## 2019-06-11 DIAGNOSIS — M6281 Muscle weakness (generalized): Secondary | ICD-10-CM

## 2019-06-11 NOTE — Therapy (Signed)
[facility] [place], [place], [address] Phone: [phone]   Fax:  [phone]  Physical Therapy Treatment  Patient Details  Name: Lori Soto MRN: [id number] Date of Birth: [birth year] Referring Provider (PT): Ronnie Doss DO.   Encounter Date: 06/11/2019  PT End of Session - 06/11/19 1433    Visit Number  3    Number of Visits  12    Date for PT Re-Evaluation  08/26/19    PT Start Time  5701    PT Stop Time  1419    PT Time Calculation (min)  51 min    Activity Tolerance  Patient tolerated treatment well    Behavior During Therapy  [facility] for tasks assessed/performed       Past Medical History:  Diagnosis Date  . Anemia   . Arthritis   . Cancer ([place])    skin cancer on nose  . Carpal tunnel syndrome, bilateral   . Cataract   . Esophagitis   . GERD (gastroesophageal reflux disease)   . H/O hiatal hernia   . Osteopenia   . Scoliosis   . Shingles   . SVT (supraventricular tachycardia) ([place])   . Ulcerative colitis     Past Surgical History:  Procedure Laterality Date  . ABDOMINAL HYSTERECTOMY    . BUNIONECTOMY     Bilateral  . CARPAL TUNNEL RELEASE Left 03/10/2015   Procedure: LEFT CARPAL TUNNEL RELEASE;  Surgeon: Daryll Brod, MD;  Location: [place];  Service: Orthopedics;  Laterality: Left;  . CARPAL TUNNEL RELEASE Right 10/31/2017   Procedure: RIGHT CARPAL TUNNEL RELEASE;  Surgeon: Daryll Brod, MD;  Location: [place];  Service: Orthopedics;  Laterality: Right;  . CATARACT EXTRACTION W/PHACO  05/28/2012   Procedure: CATARACT EXTRACTION PHACO AND INTRAOCULAR LENS PLACEMENT (IOC);  Surgeon: Tonny Branch, MD;  Location[facility];  Service: Ophthalmology;  Laterality: Right;  CDE=12.84  . CATARACT EXTRACTION W/PHACO  06/07/2012   Procedure: CATARACT EXTRACTION PHACO AND INTRAOCULAR LENS PLACEMENT (IOC);  Surgeon: Tonny Branch, MD;  Location: [facility];  Service: Ophthalmology;   Laterality: Left;  CDE 17.60  . COLONOSCOPY    . COLONOSCOPY N/A 07/08/2015   Procedure: COLONOSCOPY;  Surgeon: Rogene Houston, MD;  [facility];  Service: Endoscopy;  Laterality: N/A;  930  . ESOPHAGEAL DILATION N/A 07/08/2015   Procedure: ESOPHAGEAL DILATION;  Surgeon: Rogene Houston, MD;  [facility];  Service: Endoscopy;  Laterality: N/A;  . ESOPHAGOGASTRODUODENOSCOPY N/A 07/08/2015   Procedure: ESOPHAGOGASTRODUODENOSCOPY (EGD);  Surgeon: Rogene Houston, MD;  [facility];  Service: Endoscopy;  Laterality: N/A;  . Hernia     Right inguinal  . HERNIA REPAIR  1961   right inguinal hernia  . UPPER GASTROINTESTINAL ENDOSCOPY      There were no vitals filed for this visit.  Subjective Assessment - 06/11/19 1430    Subjective  COVID 19 screening performed on patient upon arrival. Patient reports that she tried using her brace to allow her to stand longer. Trying to be more compliant with stretching and posture awareness.    Pertinent History  Scoliosis, hiatial hernia, osteopenia, allergy to tape    Limitations  Sitting;House hold activities;Standing;Walking    How long can you stand comfortably?  20 minutes    How long can you walk comfortably?  20-30 minutes max    Diagnostic tests  x-ray    Patient Stated Goals  improve ability to  perform home activities with less pain    Currently in Pain?  Yes    Pain Score  8     Pain Location  Back    Pain Orientation  Right;Lower    Pain Descriptors / Indicators  Discomfort    Pain Type  Chronic pain    Pain Onset  More than a month ago    Pain Frequency  Constant         [facility] PT Assessment - 06/11/19 0001      Assessment   Medical Diagnosis  DDD, lumbar.    Referring Provider (PT)  Ronnie Doss DO.      Restrictions   Weight Bearing Restrictions  No                   [facility] Adult PT Treatment/Exercise - 06/11/19 0001      Lumbar Exercises: Aerobic   Nustep  L2 x17 min       Lumbar Exercises: Machines for Strengthening   Cybex Lumbar Extension  60# x20 reps      Lumbar Exercises: Standing   Row  Strengthening;Both;20 reps;Limitations    Row Limitations  Green XTS    Shoulder Extension  Strengthening;Both;20 reps;Limitations    Shoulder Extension Limitations  Green XTS      Modalities   Modalities  Electrical Stimulation;Moist Heat      Moist Heat Therapy   Number Minutes Moist Heat  15 Minutes    Moist Heat Location  Lumbar Spine      Electrical Stimulation   Electrical Stimulation Location  Bil low back.    Electrical Stimulation Action  IFC    Electrical Stimulation Parameters  80-150 hz x15 min    Electrical Stimulation Goals  Pain;Tone      Manual Therapy   Manual Therapy  Soft tissue mobilization    Soft tissue mobilization  STW to R QL, lumbar paraspinals, superior glute to reduce pain and tone                  PT Long Term Goals - 05/28/19 1248      PT LONG TERM GOAL #1   Title  Patient will be independent with an advanced HEP    Time  6    Period  Weeks    Status  New      PT LONG TERM GOAL #2   Title  Patient will report ability to perfrom ADLs with low back pain less than or equal to 4/10    Time  12    Period  Weeks    Status  New            Plan - 06/11/19 1618    Clinical Impression Statement  Patient presented in clinic with reports of continued high level LBP. Patient guided through postural and lumbar strengthening with mutlimodal cueing to correct posture within her allowed posture. Continued palpable tenderness and tightness of R QL region. Normal modalities response noted following removal of the modalities.    Personal Factors and Comorbidities  Comorbidity 1;Comorbidity 2    Comorbidities  Scoliosis, hiatial hernia, osteopenia (T-score -2.5), allergy to tape    Stability/Clinical Decision Making  Evolving/Moderate complexity    Rehab Potential  Fair    PT Frequency  1x / week    PT Duration  12 weeks     PT Treatment/Interventions  ADLs/Self Care Home Management;Functional mobility training;Therapeutic activities;Therapeutic exercise    PT Next Visit Plan  Comprehensive  ther ex program to include stretching, postural exercises, PRE exercises, core exercise program, neuro re-edu activities.    PT Home Exercise Plan  see patient education section    Consulted and Agree with Plan of Care  Patient       Patient will benefit from skilled therapeutic intervention in order to improve the following deficits and impairments:  Abnormal gait, Pain, Postural dysfunction, Decreased activity tolerance, Decreased range of motion, Decreased strength  Visit Diagnosis: Abnormal posture  Chronic bilateral low back pain without sciatica  Muscle weakness (generalized)     Problem List Patient Active Problem List   Diagnosis Date Noted  . GERD (gastroesophageal reflux disease) 06/10/2019  . Esophageal stricture 06/10/2019  . Educated about COVID-19 virus infection 06/04/2019  . SVT (supraventricular tachycardia) ([place]) 08/28/2018  . Elevated troponin 08/28/2018  . Light headedness 05/09/2018  . Osteopenia of the elderly 02/19/2014  . DDD (degenerative disc disease), lumbar 12/03/2013  . Scoliosis 12/03/2013  . Chronic LBP 05/14/2013  . IBS (irritable bowel syndrome) 11/08/2012  . UC (ulcerative colitis) ([place]) 04/23/2012  . Palpitation 10/26/2011  . Hypertension 10/26/2011  . Chronic ulcerative colitis ([place]) 03/23/2011    Standley Brooking, PTA 06/11/2019, 4:23 PM  [facility] [address] [place], [place], [address] Phone: [phone]   Fax:  [phone]  Name: Lori Soto MRN: [id number] Date of Birth: Sep 01, 1935

## 2019-06-13 ENCOUNTER — Ambulatory Visit (INDEPENDENT_AMBULATORY_CARE_PROVIDER_SITE_OTHER): Payer: Medicare Other | Admitting: *Deleted

## 2019-06-13 DIAGNOSIS — Z0000 Encounter for general adult medical examination without abnormal findings: Secondary | ICD-10-CM | POA: Diagnosis not present

## 2019-06-13 DIAGNOSIS — M1990 Unspecified osteoarthritis, unspecified site: Secondary | ICD-10-CM | POA: Insufficient documentation

## 2019-06-13 NOTE — Progress Notes (Addendum)
MEDICARE ANNUAL WELLNESS VISIT  06/13/2019  Telephone Visit Disclaimer This Medicare AWV was conducted by telephone due to national recommendations for restrictions regarding the COVID-19 Pandemic (e.g. social distancing).  I verified, using two identifiers, that I am speaking with Lori Soto or their authorized healthcare agent. I discussed the limitations, risks, security, and privacy concerns of performing an evaluation and management service by telephone and the potential availability of an in-person appointment in the future. The patient expressed understanding and agreed to proceed.   Subjective:  Lori Soto is a 84 y.o. female patient of Lori Norlander, DO who had a Medicare Annual Wellness Visit today via telephone. Manpreet is Retired and lives alone. she has 3 children. she reports that she is socially active and does interact with friends/family regularly. she is minimally physically active and enjoys .  Patient Care Team: Lori Norlander, DO as PCP - General (Family Medicine) Minus Breeding, MD as PCP - Cardiology (Cardiology) Rogene Houston, MD as Consulting Physician (Gastroenterology) Gaynelle Arabian, MD as Consulting Physician (Orthopedic Surgery) Daryll Brod, MD as Consulting Physician (Orthopedic Surgery) Particia Nearing, [facility] (Optometry) Ilean China, RN as Registered Nurse  Advanced Directives 06/13/2019 08/26/2018 08/24/2018 06/26/2018 12/02/2017 11/21/2017 10/31/2017  Does Patient Have a Medical Advance Directive? Yes No Yes Yes Yes Yes Yes  Type of Paramedic of [place];Living will Healthcare Power of [place];Living will Living will;Healthcare Power of Attorney Living will  Does patient want to make changes to medical advance directive? No - Patient declined - - - - No - Patient declined No - Patient declined  Copy of [place] in Chart? No -  copy requested No - copy requested - - - No - copy requested No - copy requested  Would patient like information on creating a medical advance directive? - No - Patient declined - - - - -  Pre-existing out of facility DNR order (yellow form or pink MOST form) - - - - - - -    Hospital Utilization Over the Past 12 Months: # of hospitalizations or ER visits: 0 # of surgeries: 0  Review of Systems    Patient reports that her overall health is worse compared to last year.  History obtained from chart review and the patient General ROS: negative  Patient Reported Readings (BP, Pulse, CBG, Weight, etc) none  Pain Assessment Pain : 0-10 Pain Score: 10-Worst pain ever Pain Type: Chronic pain Pain Location: Back Pain Descriptors / Indicators: Constant Pain Onset: More than a month ago Pain Frequency: Constant     Current Medications & Allergies (verified) Allergies as of 06/13/2019       Reactions   Penicillins Itching, Swelling   Patient states that she had a rash also Has patient had a PCN reaction causing immediate rash, facial/tongue/throat swelling, SOB or lightheadedness with hypotension: No Has patient had a PCN reaction causing severe rash involving mucus membranes or skin necrosis: No Has patient had a PCN reaction that required hospitalization No Has patient had a PCN reaction occurring within the last 10 years: No If all of the above answers are "NO", then may proceed with Cephalosporin use.   Tape Itching   Influenza Virus Vacc Split Pf Rash   Per Patient she was told by her PCP not to ever take this injection again        Medication List  Accurate as of June 13, 2019  3:16 PM. If you have any questions, ask your nurse or doctor.          Bromelains 500 MG Tabs Take 500 mg by mouth daily.   Calcium Carbonate-Vitamin D 600-400 MG-UNIT tablet Commonly known as: Calcium 600+D Take 1 tablet by mouth every evening.   Cayenne 500 MG Caps Take 1  capsule by mouth daily.   CO Q-10 PO Take 1 tablet by mouth daily. With red yeast rice [employer]   COLLAGEN PO Take 1 tablet by mouth daily. This is known as 1-2-3   CRANBERRY CONCENTRATE PO Take 2,500 mg by mouth daily.   Ginkgo Biloba 40 MG Tabs Take 40 mg by mouth daily.   glucosamine-chondroitin 500-400 MG tablet Take 1 tablet by mouth 2 (two) times daily.   GRAPESEED EXTRACT PO Take 60 mg by mouth daily.   HYALURONIC ACID PO Take 80 mg by mouth daily.   Mesalamine 400 MG Cpdr DR capsule Commonly known as: ASACOL TAKE 2 CAPSULES DAILY   metoprolol tartrate 25 MG tablet Commonly known as: LOPRESSOR Take 1 tablet (25 mg total) by mouth 2 (two) times daily. What changed: additional instructions   MULTIPLE VITAMINS PO Take 1 tablet by mouth daily. In the mornings   OAT BRAN SOLUBLE PO Take 1 tablet by mouth daily.   OVER THE COUNTER MEDICATION Take 250 mg by mouth daily. Alphalipoic Acid 250 mg daily   OVER THE COUNTER MEDICATION Take 66 mg by mouth daily. Vision Gold Lutein   pantoprazole 40 MG tablet Commonly known as: PROTONIX TAKE 1 TABLET DAILY BEFORE BREAKFAST What changed: when to take this   pyridOXINE 100 MG tablet Commonly known as: VITAMIN B-6 Take 100 mg by mouth daily.   Turmeric 500 MG Caps Take 1 capsule by mouth 3 (three) times daily.   Tylenol 8 Hour Arthritis Pain 650 MG CR tablet Generic drug: acetaminophen Take 650 mg by mouth every 8 (eight) hours as needed. Patient states that she takes prn for arthritis in her knees.   VITAMIN B 12 PO Take 1,000 mcg by mouth daily.   Zinc 50 MG Caps Take 50 mg by mouth daily.        History (reviewed): Past Medical History:  Diagnosis Date   Anemia    Arthritis    Cancer ([place])    skin cancer on nose   Carpal tunnel syndrome, bilateral    Cataract    Esophagitis    GERD (gastroesophageal reflux disease)    H/O hiatal hernia    Osteopenia    Scoliosis    Shingles     SVT (supraventricular tachycardia) (HCC)    Ulcerative colitis    Past Surgical History:  Procedure Laterality Date   ABDOMINAL HYSTERECTOMY     BUNIONECTOMY     Bilateral   CARPAL TUNNEL RELEASE Left 03/10/2015   Procedure: LEFT CARPAL TUNNEL RELEASE;  Surgeon: Daryll Brod, MD;  Location: [facility];  Service: Orthopedics;  Laterality: Left;   CARPAL TUNNEL RELEASE Right 10/31/2017   Procedure: RIGHT CARPAL TUNNEL RELEASE;  Surgeon: Daryll Brod, MD;  Location: [facility];  Service: Orthopedics;  Laterality: Right;   CATARACT EXTRACTION W/PHACO  05/28/2012   Procedure: CATARACT EXTRACTION PHACO AND INTRAOCULAR LENS PLACEMENT (IOC);  Surgeon: Tonny Branch, MD;  [facility];  Service: Ophthalmology;  Laterality: Right;  CDE=12.84   CATARACT EXTRACTION W/PHACO  06/07/2012   Procedure: CATARACT EXTRACTION PHACO  AND INTRAOCULAR LENS PLACEMENT (IOC);  Surgeon: Tonny Branch, MD;  [facility];  Service: Ophthalmology;  Laterality: Left;  CDE 17.60   COLONOSCOPY     COLONOSCOPY N/A 07/08/2015   Procedure: COLONOSCOPY;  Surgeon: Rogene Houston, MD;  [facility];  Service: Endoscopy;  Laterality: N/A;  930   ESOPHAGEAL DILATION N/A 07/08/2015   Procedure: ESOPHAGEAL DILATION;  Surgeon: Rogene Houston, MD;  [facility];  Service: Endoscopy;  Laterality: N/A;   ESOPHAGOGASTRODUODENOSCOPY N/A 07/08/2015   Procedure: ESOPHAGOGASTRODUODENOSCOPY (EGD);  Surgeon: Rogene Houston, MD;  [facility];  Service: Endoscopy;  Laterality: N/A;   Hernia     Right inguinal   HERNIA REPAIR  1961   right inguinal hernia   UPPER GASTROINTESTINAL ENDOSCOPY     Family History  Problem Relation Age of Onset   Arthritis Mother    Heart disease Mother    Cancer Father        pancreat   Pancreatic cancer Father    Arthritis Father        back issues    Cancer Brother        lung and liver   Liver cancer Brother    Lung cancer Brother    Heart disease  Sister        Rheumatic Fever   Peptic Ulcer Sister    Diabetes Sister    Colon cancer Sister    Edema Sister    GI problems Sister        diverticulitis   Diabetes Sister    Neuropathy Sister    COPD Sister    Hiatal hernia Sister    GI problems Sister    Social History   Socioeconomic History   Marital status: Widowed    Spouse name: Not on file   Number of children: 3   Years of education: Not on file   Highest education level: Not on file  Occupational History    Employer: RETIRED    Comment: accounting  Tobacco Use   Smoking status: Never Smoker   Smokeless tobacco: Never Used   Tobacco comment: Never smoker  Substance and Sexual Activity   Alcohol use: Yes    Alcohol/week: 0.0 standard drinks    Comment: Very Rarely   Drug use: No   Sexual activity: Not Currently    Birth control/protection: Surgical  Other Topics Concern   Not on file  Social History Narrative   Lives alone.   Social Determinants of Health   Financial Resource Strain: Low Risk    Difficulty of Paying Living Expenses: Not hard at all  Food Insecurity: No Food Insecurity   Worried About Charity fundraiser in the Last Year: Never true   [place] in the Last Year: Never true  Transportation Needs: No Transportation Needs   Lack of Transportation (Medical): No   Lack of Transportation (Non-Medical): No  Physical Activity: Inactive   Days of Exercise per Week: 0 days   Minutes of Exercise per Session: 0 min  Stress: No Stress Concern Present   Feeling of Stress : Only a little  Social Connections: Somewhat Isolated   Frequency of Communication with Friends and Family: More than three times a week   Frequency of Social Gatherings with Friends and Family: More than three times a week   Attends Religious Services: More than 4 times per year   Active Member of Clubs or Organizations: No  Attends Archivist Meetings: Never   Marital Status: Widowed    Activities of Daily  Living In your present state of health, do you have any difficulty performing the following activities: 06/13/2019 04/24/2019  Hearing? N N  Vision? N N  Difficulty concentrating or making decisions? Y N  Walking or climbing stairs? Y Y  Comment - scoliosis pain  Dressing or bathing? N N  Doing errands, shopping? N N  Preparing Food and eating ? N N  Using the Toilet? N N  In the past six months, have you accidently leaked urine? N N  Do you have problems with loss of bowel control? N N  Managing your Medications? N N  Managing your Finances? N N  Housekeeping or managing your Housekeeping? N N  Some recent data might be hidden    Patient Education/ Literacy How often do you need to have someone help you when you read instructions, pamphlets, or other written materials from your doctor or pharmacy?: 1 - Never What is the last grade level you completed in school?: College  Exercise Current Exercise Habits: Home exercise routine, Type of exercise: walking;stretching, Time (Minutes): 60, Frequency (Times/Week): 7, Weekly Exercise (Minutes/Week): 420, Exercise limited by: orthopedic condition(s)  Diet Patient reports consuming 3 meals a day and 1 snack(s) a day Patient reports that her primary diet is: Regular Patient reports that she does have regular access to food.   Depression Screen PHQ 2/9 Scores 06/13/2019 04/24/2019 03/14/2018 11/21/2017 10/30/2017 06/22/2017 12/22/2016  PHQ - 2 Score 0 0 1 0 1 1 0  PHQ- 9 Score - - - - - - -     Fall Risk Fall Risk  06/13/2019 03/14/2018 11/21/2017 10/30/2017 06/22/2017  Falls in the past year? 0 0 No No No  Number falls in past yr: 0 - - - -  Injury with Fall? 0 - - - -  Risk for fall due to : No Fall Risks - - - -  Follow up Falls evaluation completed - - - -     Objective:  Jolayne Haines Korbin Notaro seemed alert and oriented and she participated appropriately during our telephone visit.  Blood Pressure Weight BMI  BP Readings from Last 3  Encounters:  06/10/19 140/79  06/05/19 (!) 150/80  12/04/18 133/81   Wt Readings from Last 3 Encounters:  06/10/19 133 lb 8 oz (60.6 kg)  06/05/19 134 lb (60.8 kg)  12/04/18 131 lb (59.4 kg)   BMI Readings from Last 1 Encounters:  06/10/19 24.42 kg/m    *Unable to obtain current vital signs, weight, and BMI due to telephone visit type  Hearing/Vision  Amia did  seem to have difficulty with hearing/understanding during the telephone conversation Reports that she has not had a formal eye exam by an eye care professional within the past year Reports that she has not had a formal hearing evaluation within the past year *Unable to fully assess hearing and vision during telephone visit type  Cognitive Function: 6CIT Screen 06/13/2019  What Year? 0 points  What month? 0 points  What time? 0 points  Count back from 20 0 points  Months in reverse 0 points  Repeat phrase 0 points  Total Score 0   (Normal:0-7, Significant for Dysfunction: >8)  Normal Cognitive Function Screening: Yes   Immunization & Health Maintenance Record Immunization History  Administered Date(s) Administered   Pneumococcal Conjugate-13 02/26/2014   Pneumococcal Polysaccharide-23 03/20/2012   Td 01/23/2015   Tdap 01/23/2015  Health Maintenance  Topic Date Due   INFLUENZA VACCINE  12/23/2019 (Originally 12/01/2018)   MAMMOGRAM  02/13/2020   DEXA SCAN  05/22/2021   TETANUS/TDAP  01/22/2025   PNA vac Low Risk Adult  Completed       Assessment  This is a routine wellness examination for Kortnee Bas.  Health Maintenance: Due or Overdue There are no preventive care reminders to display for this patient.  Jolayne Haines Wanette Robison does not need a referral for Community Assistance: Care Management:   no Social Work:    no Prescription Assistance:  no Nutrition/Diabetes Education:  no   Plan:  Personalized Goals Goals Addressed   None    Personalized Health Maintenance & Screening  Recommendations  Up to Date  Lung Cancer Screening Recommended: no (Low Dose CT Chest recommended if Age 21-80 years, 30 pack-year currently smoking OR have quit w/in past 15 years) Hepatitis C Screening recommended: no HIV Screening recommended: no  Advanced Directives: Written information was not prepared per patient's request.  Referrals & Orders Orders Placed This Encounter  Procedures   HM MAMMOGRAPHY    Follow-up Plan Follow-up with Lori Norlander, DO as planned   I have personally reviewed and noted the following in the patient's chart:   Medical and social history Use of alcohol, tobacco or illicit drugs  Current medications and supplements Functional ability and status Nutritional status Physical activity Advanced directives List of other physicians Hospitalizations, surgeries, and ER visits in previous 12 months Vitals Screenings to include cognitive, depression, and falls Referrals and appointments  In addition, I have reviewed and discussed with Lori Soto certain preventive protocols, quality metrics, and best practice recommendations. A written personalized care plan for preventive services as well as general preventive health recommendations is available and can be mailed to the patient at her request.      Danton Sewer, LPN  4/[id number]/7591    I have reviewed and agree with the above AWV documentation.   Evelina Dun, FNP

## 2019-06-18 ENCOUNTER — Encounter: Payer: Self-pay | Admitting: Physical Therapy

## 2019-06-18 ENCOUNTER — Other Ambulatory Visit: Payer: Self-pay

## 2019-06-18 ENCOUNTER — Ambulatory Visit: Payer: Medicare Other | Admitting: Physical Therapy

## 2019-06-18 DIAGNOSIS — M6281 Muscle weakness (generalized): Secondary | ICD-10-CM

## 2019-06-18 DIAGNOSIS — G8929 Other chronic pain: Secondary | ICD-10-CM

## 2019-06-18 DIAGNOSIS — R293 Abnormal posture: Secondary | ICD-10-CM | POA: Diagnosis not present

## 2019-06-18 NOTE — Therapy (Signed)
[facility] [place], [place], [address] Phone: [phone]   Fax:  [phone]  Physical Therapy Treatment  Patient Details  Name: Lori Soto MRN: [id number] Date of Birth: Sep 09, 1935 Referring Provider (PT): Ronnie Doss DO.   Encounter Date: 06/18/2019  PT End of Session - 06/18/19 1206    Visit Number  4    Number of Visits  12    Date for PT Re-Evaluation  08/26/19    PT Start Time  1118    PT Stop Time  1213    PT Time Calculation (min)  55 min    Activity Tolerance  Patient tolerated treatment well    Behavior During Therapy  WFL for tasks assessed/performed       Past Medical History:  Diagnosis Date  . Anemia   . Arthritis   . Cancer ([place])    skin cancer on nose  . Carpal tunnel syndrome, bilateral   . Cataract   . Esophagitis   . GERD (gastroesophageal reflux disease)   . H/O hiatal hernia   . Osteopenia   . Scoliosis   . Shingles   . SVT (supraventricular tachycardia) ([place])   . Ulcerative colitis     Past Surgical History:  Procedure Laterality Date  . ABDOMINAL HYSTERECTOMY    . BUNIONECTOMY     Bilateral  . CARPAL TUNNEL RELEASE Left 03/10/2015   Procedure: LEFT CARPAL TUNNEL RELEASE;  Surgeon: Daryll Brod, MD;  Location: [facility];  Service: Orthopedics;  Laterality: Left;  . CARPAL TUNNEL RELEASE Right 10/31/2017   Procedure: RIGHT CARPAL TUNNEL RELEASE;  Surgeon: Daryll Brod, MD;  Location: [facility];  Service: Orthopedics;  Laterality: Right;  . CATARACT EXTRACTION W/PHACO  05/28/2012   Procedure: CATARACT EXTRACTION PHACO AND INTRAOCULAR LENS PLACEMENT (IOC);  Surgeon: Tonny Branch, MD;  [facility];  Service: Ophthalmology;  Laterality: Right;  CDE=12.84  . CATARACT EXTRACTION W/PHACO  06/07/2012   Procedure: CATARACT EXTRACTION PHACO AND INTRAOCULAR LENS PLACEMENT (IOC);  Surgeon: Tonny Branch, MD;  [facility];  Service: Ophthalmology;   Laterality: Left;  CDE 17.60  . COLONOSCOPY    . COLONOSCOPY N/A 07/08/2015   Procedure: COLONOSCOPY;  Surgeon: Rogene Houston, MD;  [facility];  Service: Endoscopy;  Laterality: N/A;  930  . ESOPHAGEAL DILATION N/A 07/08/2015   Procedure: ESOPHAGEAL DILATION;  Surgeon: Rogene Houston, MD;  [facility];  Service: Endoscopy;  Laterality: N/A;  . ESOPHAGOGASTRODUODENOSCOPY N/A 07/08/2015   Procedure: ESOPHAGOGASTRODUODENOSCOPY (EGD);  Surgeon: Rogene Houston, MD;  [facility];  Service: Endoscopy;  Laterality: N/A;  . Hernia     Right inguinal  . HERNIA REPAIR  1961   right inguinal hernia  . UPPER GASTROINTESTINAL ENDOSCOPY      There were no vitals filed for this visit.  Subjective Assessment - 06/18/19 1125    Subjective  COVID 19 screening performed on patient upon arrival. Patient reports  not doing much this morning but still has 7/10 LBP.    Pertinent History  Scoliosis, hiatial hernia, osteopenia, allergy to tape    Limitations  Sitting;House hold activities;Standing;Walking    How long can you stand comfortably?  20 minutes    How long can you walk comfortably?  20-30 minutes max    Diagnostic tests  x-ray    Patient Stated Goals  improve ability to perform home activities with less pain    Currently in  Pain?  Yes    Pain Score  7     Pain Location  Back    Pain Orientation  Right;Left;Lower    Pain Descriptors / Indicators  Discomfort    Pain Type  Chronic pain    Pain Onset  More than a month ago    Pain Frequency  Constant         [facility] PT Assessment - 06/18/19 0001      Assessment   Medical Diagnosis  DDD, lumbar.    Referring Provider (PT)  Ronnie Doss DO.      Restrictions   Weight Bearing Restrictions  No                   [facility] Adult PT Treatment/Exercise - 06/18/19 0001      Lumbar Exercises: Aerobic   Nustep  L4 x59m     Lumbar Exercises: Machines for Strengthening   Cybex Lumbar Extension  60#  x20 reps   with holds at end range     Lumbar Exercises: Standing   Row  Strengthening;Both;20 reps;Limitations    Row Limitations  Green XTS    Shoulder Extension  Strengthening;Both;20 reps;Limitations    Shoulder Extension Limitations  Green XTS      Modalities   Modalities  Electrical Stimulation;Moist Heat      Moist Heat Therapy   Number Minutes Moist Heat  15 Minutes    Moist Heat Location  Lumbar Spine      Electrical Stimulation   Electrical Stimulation Location  Bil low back.    Electrical Stimulation Action  IFC    Electrical Stimulation Parameters  80-150 hz x15 min    Electrical Stimulation Goals  Pain;Tone      Manual Therapy   Manual Therapy  Soft tissue mobilization    Soft tissue mobilization  STW to B QL, lumbar paraspinals, superior glute to reduce pain and tone                  PT Long Term Goals - 05/28/19 1248      PT LONG TERM GOAL #1   Title  Patient will be independent with an advanced HEP    Time  6    Period  Weeks    Status  New      PT LONG TERM GOAL #2   Title  Patient will report ability to perfrom ADLs with low back pain less than or equal to 4/10    Time  12    Period  Weeks    Status  New            Plan - 06/18/19 1208    Clinical Impression Statement  Patient presented in clinic with 7/10 B LBP. Patient reports feeling unbalanced when standing and attempting to stand in proper posture. Patient reports benefit and reduction of pain for a few days after manual therapy. R muscle tightness palpable more than L lumbar musclature. Normal modalities response noted following removal of the modalities.    Personal Factors and Comorbidities  Comorbidity 1;Comorbidity 2    Comorbidities  Scoliosis, hiatial hernia, osteopenia (T-score -2.5), allergy to tape    Stability/Clinical Decision Making  Evolving/Moderate complexity    Rehab Potential  Fair    PT Frequency  1x / week    PT Duration  12 weeks    PT  Treatment/Interventions  ADLs/Self Care Home Management;Functional mobility training;Therapeutic activities;Therapeutic exercise    PT Next Visit Plan  Comprehensive ther ex program to include stretching, postural exercises, PRE exercises, core exercise program, neuro re-edu activities.    PT Home Exercise Plan  see patient education section    Consulted and Agree with Plan of Care  Patient       Patient will benefit from skilled therapeutic intervention in order to improve the following deficits and impairments:  Abnormal gait, Pain, Postural dysfunction, Decreased activity tolerance, Decreased range of motion, Decreased strength  Visit Diagnosis: Abnormal posture  Chronic bilateral low back pain without sciatica  Muscle weakness (generalized)     Problem List Patient Active Problem List   Diagnosis Date Noted  . Arthritis 06/13/2019  . GERD (gastroesophageal reflux disease) 06/10/2019  . Esophageal stricture 06/10/2019  . Educated about COVID-19 virus infection 06/04/2019  . SVT (supraventricular tachycardia) ([place]) 08/28/2018  . Elevated troponin 08/28/2018  . Light headedness 05/09/2018  . Carpal tunnel syndrome of right wrist 05/20/2015  . Primary localized osteoarthrosis, hand 05/20/2015  . Carpal tunnel syndrome on left 03/18/2015  . Osteopenia of the elderly 02/19/2014  . DDD (degenerative disc disease), lumbar 12/03/2013  . Scoliosis 12/03/2013  . Chronic LBP 05/14/2013  . IBS (irritable bowel syndrome) 11/08/2012  . UC (ulcerative colitis) ([place]) 04/23/2012  . Palpitation 10/26/2011  . Hypertension 10/26/2011  . Chronic ulcerative colitis ([place]) 03/23/2011    Standley Brooking, PTA 06/18/2019, 12:19 PM  [facility] [address] [place], [place], [address] Phone: [phone]   Fax:  [phone]  Name: Lori Soto MRN: [id number] Date of Birth: 03-26-1936

## 2019-06-25 ENCOUNTER — Ambulatory Visit: Payer: Medicare Other | Admitting: *Deleted

## 2019-06-25 ENCOUNTER — Other Ambulatory Visit: Payer: Self-pay

## 2019-06-25 DIAGNOSIS — M546 Pain in thoracic spine: Secondary | ICD-10-CM

## 2019-06-25 DIAGNOSIS — M545 Low back pain, unspecified: Secondary | ICD-10-CM

## 2019-06-25 DIAGNOSIS — R293 Abnormal posture: Secondary | ICD-10-CM

## 2019-06-25 DIAGNOSIS — M6281 Muscle weakness (generalized): Secondary | ICD-10-CM

## 2019-06-25 DIAGNOSIS — G8929 Other chronic pain: Secondary | ICD-10-CM

## 2019-06-25 NOTE — Therapy (Signed)
[facility] [place], [place], [address] Phone: [phone]   Fax:  [phone]  Physical Therapy Treatment  Patient Details  Name: Lori Soto MRN: [id number] Date of Birth: Sep 07, 1935 Referring Provider (PT): Ronnie Doss DO.   Encounter Date: 06/25/2019  PT End of Session - 06/25/19 1208    Visit Number  5    Number of Visits  12    Date for PT Re-Evaluation  08/26/19    PT Start Time  1115    PT Stop Time  1206    PT Time Calculation (min)  51 min       Past Medical History:  Diagnosis Date  . Anemia   . Arthritis   . Cancer ([place])    skin cancer on nose  . Carpal tunnel syndrome, bilateral   . Cataract   . Esophagitis   . GERD (gastroesophageal reflux disease)   . H/O hiatal hernia   . Osteopenia   . Scoliosis   . Shingles   . SVT (supraventricular tachycardia) ([place])   . Ulcerative colitis     Past Surgical History:  Procedure Laterality Date  . ABDOMINAL HYSTERECTOMY    . BUNIONECTOMY     Bilateral  . CARPAL TUNNEL RELEASE Left 03/10/2015   Procedure: LEFT CARPAL TUNNEL RELEASE;  Surgeon: Daryll Brod, MD;  Location: [place];  Service: Orthopedics;  Laterality: Left;  . CARPAL TUNNEL RELEASE Right 10/31/2017   Procedure: RIGHT CARPAL TUNNEL RELEASE;  Surgeon: Daryll Brod, MD;  Location: [place];  Service: Orthopedics;  Laterality: Right;  . CATARACT EXTRACTION W/PHACO  05/28/2012   Procedure: CATARACT EXTRACTION PHACO AND INTRAOCULAR LENS PLACEMENT (IOC);  Surgeon: Tonny Branch, MD;  [facility];  Service: Ophthalmology;  Laterality: Right;  CDE=12.84  . CATARACT EXTRACTION W/PHACO  06/07/2012   Procedure: CATARACT EXTRACTION PHACO AND INTRAOCULAR LENS PLACEMENT (IOC);  Surgeon: Tonny Branch, MD;  [facility];  Service: Ophthalmology;  Laterality: Left;  CDE 17.60  . COLONOSCOPY    . COLONOSCOPY N/A 07/08/2015   Procedure: COLONOSCOPY;  Surgeon: Rogene Houston, MD;  [facility];  Service: Endoscopy;  Laterality: N/A;  930  . ESOPHAGEAL DILATION N/A 07/08/2015   Procedure: ESOPHAGEAL DILATION;  Surgeon: Rogene Houston, MD;  [facility];  Service: Endoscopy;  Laterality: N/A;  . ESOPHAGOGASTRODUODENOSCOPY N/A 07/08/2015   Procedure: ESOPHAGOGASTRODUODENOSCOPY (EGD);  Surgeon: Rogene Houston, MD;  [facility];  Service: Endoscopy;  Laterality: N/A;  . Hernia     Right inguinal  . HERNIA REPAIR  1961   right inguinal hernia  . UPPER GASTROINTESTINAL ENDOSCOPY      There were no vitals filed for this visit.  Subjective Assessment - 06/25/19 1128    Subjective  COVID 19 screening performed on patient upon arrival. Patient  3/10 LBP this morning.    Pertinent History  Scoliosis, hiatial hernia, osteopenia, allergy to tape    Limitations  Sitting;House hold activities;Standing;Walking    How long can you stand comfortably?  20 minutes    How long can you walk comfortably?  20-30 minutes max    Diagnostic tests  x-ray    Patient Stated Goals  improve ability to perform home activities with less pain    Currently in Pain?  Yes    Pain Score  3     Pain Location  Back    Pain Orientation  Right;Left;Lower  Pain Descriptors / Indicators  Discomfort    Pain Type  Chronic pain    Pain Onset  More than a month ago                       [facility] Adult PT Treatment/Exercise - 06/25/19 0001      Lumbar Exercises: Aerobic   Nustep  L2  x33mwith focus on posture      Lumbar Exercises: Standing   Row  Strengthening;Both;20 reps;Limitations;10 reps    Row Limitations  blue XTS    Shoulder Extension  Strengthening;Both;20 reps;Limitations    Shoulder Extension Limitations  blue XTS      Modalities   Modalities  Electrical Stimulation;Moist Heat      Moist Heat Therapy   Number Minutes Moist Heat  15 Minutes    Moist Heat Location  Lumbar Spine      Electrical Stimulation   Electrical  Stimulation Location  Bil low back.    Electrical Stimulation Action  IFC     Electrical Stimulation Parameters  80-150hz  x 15 mins    Electrical Stimulation Goals  Pain;Tone      Manual Therapy   Manual Therapy  Soft tissue mobilization    Soft tissue mobilization  STW Prone pos.  to B QL, lumbar paraspinals, superior glute to reduce pain and tone                  PT Long Term Goals - 05/28/19 1248      PT LONG TERM GOAL #1   Title  Patient will be independent with an advanced HEP    Time  6    Period  Weeks    Status  New      PT LONG TERM GOAL #2   Title  Patient will report ability to perfrom ADLs with low back pain less than or equal to 4/10    Time  12    Period  Weeks    Status  New            Plan - 06/25/19 1211    Clinical Impression Statement  Pt arrived today doing a little better with less pain. She was guided through postural and core strengthening with some progression and did well. Notable tightness and soreness in BIL QL, with the RT side being the worst.    Personal Factors and Comorbidities  Comorbidity 1;Comorbidity 2    Comorbidities  Scoliosis, hiatial hernia, osteopenia (T-score -2.5), allergy to tape    Stability/Clinical Decision Making  Evolving/Moderate complexity    Rehab Potential  Fair    PT Frequency  1x / week    PT Duration  12 weeks    PT Treatment/Interventions  ADLs/Self Care Home Management;Functional mobility training;Therapeutic activities;Therapeutic exercise;Passive range of motion;Manual techniques;Cryotherapy;Electrical Stimulation;Ultrasound    PT Next Visit Plan  Comprehensive ther ex program to include stretching, postural exercises, PRE exercises, core exercise program, neuro re-edu activities.    PT Home Exercise Plan  see patient education section    Consulted and Agree with Plan of Care  Patient       Patient will benefit from skilled therapeutic intervention in order to improve the following deficits and  impairments:  Abnormal gait, Pain, Postural dysfunction, Decreased activity tolerance, Decreased range of motion, Decreased strength  Visit Diagnosis: Abnormal posture  Chronic bilateral low back pain without sciatica  Muscle weakness (generalized)  Pain in thoracic spine  Chronic right-sided low back pain  without sciatica     Problem List Patient Active Problem List   Diagnosis Date Noted  . Arthritis 06/13/2019  . GERD (gastroesophageal reflux disease) 06/10/2019  . Esophageal stricture 06/10/2019  . Educated about COVID-19 virus infection 06/04/2019  . SVT (supraventricular tachycardia) ([place]) 08/28/2018  . Elevated troponin 08/28/2018  . Light headedness 05/09/2018  . Carpal tunnel syndrome of right wrist 05/20/2015  . Primary localized osteoarthrosis, hand 05/20/2015  . Carpal tunnel syndrome on left 03/18/2015  . Osteopenia of the elderly 02/19/2014  . DDD (degenerative disc disease), lumbar 12/03/2013  . Scoliosis 12/03/2013  . Chronic LBP 05/14/2013  . IBS (irritable bowel syndrome) 11/08/2012  . UC (ulcerative colitis) ([place]) 04/23/2012  . Palpitation 10/26/2011  . Hypertension 10/26/2011  . Chronic ulcerative colitis ([place]) 03/23/2011    Stormee Duda,CHRIS, PTA 06/25/2019, 12:59 PM  [facility] [place], [place], [address] Phone: [phone]   Fax:  [phone]  Name: Lori Soto MRN: [id number] Date of Birth: Dec 28, 1935

## 2019-07-02 ENCOUNTER — Ambulatory Visit: Payer: Medicare Other | Attending: Family Medicine | Admitting: *Deleted

## 2019-07-02 ENCOUNTER — Other Ambulatory Visit: Payer: Self-pay

## 2019-07-02 DIAGNOSIS — M6281 Muscle weakness (generalized): Secondary | ICD-10-CM | POA: Diagnosis present

## 2019-07-02 DIAGNOSIS — M546 Pain in thoracic spine: Secondary | ICD-10-CM | POA: Diagnosis present

## 2019-07-02 DIAGNOSIS — M545 Low back pain: Secondary | ICD-10-CM | POA: Insufficient documentation

## 2019-07-02 DIAGNOSIS — R293 Abnormal posture: Secondary | ICD-10-CM | POA: Insufficient documentation

## 2019-07-02 DIAGNOSIS — G8929 Other chronic pain: Secondary | ICD-10-CM | POA: Diagnosis present

## 2019-07-02 NOTE — Therapy (Signed)
[facility] [place], [place], [address] Phone: [phone]   Fax:  [phone]  Physical Therapy Treatment  Patient Details  Name: Lori Soto MRN: [id number] Date of Birth: [birth year] Referring Provider (PT): Ronnie Doss DO.   Encounter Date: 07/02/2019  PT End of Session - 07/02/19 1731    Visit Number  6    Number of Visits  12    Date for PT Re-Evaluation  08/26/19    PT Start Time  1430    PT Stop Time  1519    PT Time Calculation (min)  49 min       Past Medical History:  Diagnosis Date  . Anemia   . Arthritis   . Cancer ([place])    skin cancer on nose  . Carpal tunnel syndrome, bilateral   . Cataract   . Esophagitis   . GERD (gastroesophageal reflux disease)   . H/O hiatal hernia   . Osteopenia   . Scoliosis   . Shingles   . SVT (supraventricular tachycardia) ([place])   . Ulcerative colitis     Past Surgical History:  Procedure Laterality Date  . ABDOMINAL HYSTERECTOMY    . BUNIONECTOMY     Bilateral  . CARPAL TUNNEL RELEASE Left 03/10/2015   Procedure: LEFT CARPAL TUNNEL RELEASE;  Surgeon: Daryll Brod, MD;  Location: [place];  Service: Orthopedics;  Laterality: Left;  . CARPAL TUNNEL RELEASE Right 10/31/2017   Procedure: RIGHT CARPAL TUNNEL RELEASE;  Surgeon: Daryll Brod, MD;  Location: [place];  Service: Orthopedics;  Laterality: Right;  . CATARACT EXTRACTION W/PHACO  05/28/2012   Procedure: CATARACT EXTRACTION PHACO AND INTRAOCULAR LENS PLACEMENT (IOC);  Surgeon: Tonny Branch, MD;  [facility];  Service: Ophthalmology;  Laterality: Right;  CDE=12.84  . CATARACT EXTRACTION W/PHACO  06/07/2012   Procedure: CATARACT EXTRACTION PHACO AND INTRAOCULAR LENS PLACEMENT (IOC);  Surgeon: Tonny Branch, MD;  [facility];  Service: Ophthalmology;  Laterality: Left;  CDE 17.60  . COLONOSCOPY    . COLONOSCOPY N/A 07/08/2015   Procedure: COLONOSCOPY;  Surgeon: Rogene Houston, MD;  [facility];  Service: Endoscopy;  Laterality: N/A;  930  . ESOPHAGEAL DILATION N/A 07/08/2015   Procedure: ESOPHAGEAL DILATION;  Surgeon: Rogene Houston, MD;  [facility];  Service: Endoscopy;  Laterality: N/A;  . ESOPHAGOGASTRODUODENOSCOPY N/A 07/08/2015   Procedure: ESOPHAGOGASTRODUODENOSCOPY (EGD);  Surgeon: Rogene Houston, MD;  [facility];  Service: Endoscopy;  Laterality: N/A;  . Hernia     Right inguinal  . HERNIA REPAIR  1961   right inguinal hernia  . UPPER GASTROINTESTINAL ENDOSCOPY      There were no vitals filed for this visit.  Subjective Assessment - 07/02/19 1445    Subjective  COVID 19 screening performed on patient upon arrival. Patient  3-4/10 LBP    Pertinent History  Scoliosis, hiatial hernia, osteopenia, allergy to tape    Limitations  Sitting;House hold activities;Standing;Walking    How long can you stand comfortably?  20 minutes    How long can you walk comfortably?  20-30 minutes max    Diagnostic tests  x-ray    Patient Stated Goals  improve ability to perform home activities with less pain    Currently in Pain?  Yes    Pain Score  4     Pain Location  Back    Pain Orientation  Right;Left    Pain Type  Chronic pain    Pain Onset  More than a month ago                       [facility] Adult PT Treatment/Exercise - 07/02/19 0001      Lumbar Exercises: Aerobic   Nustep  L2  x30mwith focus on posture      Lumbar Exercises: Standing   Row  Strengthening;Both;20 reps;Limitations;10 reps    Row Limitations  blue XTS    Shoulder Extension  Strengthening;Both;20 reps;Limitations    Shoulder Extension Limitations  Blue XTS      Modalities   Modalities  Electrical Stimulation;Moist Heat      Moist Heat Therapy   Number Minutes Moist Heat  15 Minutes    Moist Heat Location  Lumbar Spine      Electrical Stimulation   Electrical Stimulation Location  Bil low back. paras    Electrical Stimulation  Action  IFC    Electrical Stimulation Parameters  80-150hz  x 15 mins    Electrical Stimulation Goals  Pain;Tone      Manual Therapy   Manual Therapy  Soft tissue mobilization    Soft tissue mobilization  STW/ TPR  Prone pos.  to B QL, lumbar paraspinals, superior glute to reduce pain and tone                  PT Long Term Goals - 07/02/19 1738      PT LONG TERM GOAL #1   Title  Patient will be independent with an advanced HEP    Time  6    Period  Weeks    Status  On-going      PT LONG TERM GOAL #2   Title  Patient will report ability to perfrom ADLs with low back pain less than or equal to 4/10    Time  12    Period  Weeks    Status  On-going      PT LONG TERM GOAL #3   Title  Patient will demonstrate 4/5 or greater bilateral LE MMT to improve stability during functional tasks.    Time  6    Period  Weeks    Status  Not Met      PT LONG TERM GOAL #4   Title  Patient will report ability to stand for 20 minutes or greater with low back pain less than or equal to 5/10 to improve ability to perform home tasks.     Time  6    Period  Weeks    Status  Partially Met            Plan - 07/02/19 1732    Clinical Impression Statement  Pt arrived today doing about the same with 3-4/10 pain at this time. She was able to complete core exs f/b STW to bil QLs and paraspinals. She had notable tenderness/ tightness in RT QL with good TPR end of Rx. Pain increases over 5/10 with ADLs and LTG is ongoing at this time    Personal Factors and Comorbidities  Comorbidity 1;Comorbidity 2    Comorbidities  Scoliosis, hiatial hernia, osteopenia (T-score -2.5), allergy to tape    Stability/Clinical Decision Making  Evolving/Moderate complexity    Rehab Potential  Fair    PT Frequency  1x / week    PT Duration  12 weeks    PT Treatment/Interventions  ADLs/Self Care Home Management;Functional mobility training;Therapeutic activities;Therapeutic exercise;Passive range of motion;Manual  techniques;Cryotherapy;Electrical Stimulation;Ultrasound    PT Next Visit Plan  Comprehensive ther ex program to include stretching, postural exercises, PRE exercises, core exercise program, neuro re-edu activities.    PT Home Exercise Plan  see patient education section    Consulted and Agree with Plan of Care  Patient       Patient will benefit from skilled therapeutic intervention in order to improve the following deficits and impairments:  Abnormal gait, Pain, Postural dysfunction, Decreased activity tolerance, Decreased range of motion, Decreased strength  Visit Diagnosis: Abnormal posture  Muscle weakness (generalized)  Pain in thoracic spine  Chronic right-sided low back pain without sciatica     Problem List Patient Active Problem List   Diagnosis Date Noted  . Arthritis 06/13/2019  . GERD (gastroesophageal reflux disease) 06/10/2019  . Esophageal stricture 06/10/2019  . Educated about COVID-19 virus infection 06/04/2019  . SVT (supraventricular tachycardia) ([place]) 08/28/2018  . Elevated troponin 08/28/2018  . Light headedness 05/09/2018  . Carpal tunnel syndrome of right wrist 05/20/2015  . Primary localized osteoarthrosis, hand 05/20/2015  . Carpal tunnel syndrome on left 03/18/2015  . Osteopenia of the elderly 02/19/2014  . DDD (degenerative disc disease), lumbar 12/03/2013  . Scoliosis 12/03/2013  . Chronic LBP 05/14/2013  . IBS (irritable bowel syndrome) 11/08/2012  . UC (ulcerative colitis) ([place]) 04/23/2012  . Palpitation 10/26/2011  . Hypertension 10/26/2011  . Chronic ulcerative colitis ([place]) 03/23/2011    Jahniyah Revere,CHRIS, PTA 07/02/2019, 5:41 PM  [facility] [place], [place], [address] Phone: [phone]   Fax:  [phone]  Name: Lori Soto MRN: [id number] Date of Birth: 05/30/1935

## 2019-07-09 ENCOUNTER — Ambulatory Visit: Payer: Medicare Other | Admitting: Physical Therapy

## 2019-07-09 ENCOUNTER — Other Ambulatory Visit: Payer: Self-pay

## 2019-07-09 ENCOUNTER — Encounter: Payer: Self-pay | Admitting: Physical Therapy

## 2019-07-09 DIAGNOSIS — M6281 Muscle weakness (generalized): Secondary | ICD-10-CM

## 2019-07-09 DIAGNOSIS — R293 Abnormal posture: Secondary | ICD-10-CM

## 2019-07-09 NOTE — Therapy (Signed)
[facility] [place], [place], [address] Phone: [phone]   Fax:  [phone]  Physical Therapy Treatment  Patient Details  Name: Lori Soto MRN: [id number] Date of Birth: [birth year] Referring Provider (PT): Ronnie Doss DO.   Encounter Date: 07/09/2019  PT End of Session - 07/09/19 1201    Visit Number  7    Number of Visits  12    Date for PT Re-Evaluation  08/26/19    PT Start Time  1127    PT Stop Time  1210    PT Time Calculation (min)  43 min    Activity Tolerance  Patient tolerated treatment well    Behavior During Therapy  [facility] for tasks assessed/performed       Past Medical History:  Diagnosis Date  . Anemia   . Arthritis   . Cancer ([place])    skin cancer on nose  . Carpal tunnel syndrome, bilateral   . Cataract   . Esophagitis   . GERD (gastroesophageal reflux disease)   . H/O hiatal hernia   . Osteopenia   . Scoliosis   . Shingles   . SVT (supraventricular tachycardia) ([place])   . Ulcerative colitis     Past Surgical History:  Procedure Laterality Date  . ABDOMINAL HYSTERECTOMY    . BUNIONECTOMY     Bilateral  . CARPAL TUNNEL RELEASE Left 03/10/2015   Procedure: LEFT CARPAL TUNNEL RELEASE;  Surgeon: Daryll Brod, MD;  Location: [place];  Service: Orthopedics;  Laterality: Left;  . CARPAL TUNNEL RELEASE Right 10/31/2017   Procedure: RIGHT CARPAL TUNNEL RELEASE;  Surgeon: Daryll Brod, MD;  Location: [place];  Service: Orthopedics;  Laterality: Right;  . CATARACT EXTRACTION W/PHACO  05/28/2012   Procedure: CATARACT EXTRACTION PHACO AND INTRAOCULAR LENS PLACEMENT (IOC);  Surgeon: Tonny Branch, MD;  [facility];  Service: Ophthalmology;  Laterality: Right;  CDE=12.84  . CATARACT EXTRACTION W/PHACO  06/07/2012   Procedure: CATARACT EXTRACTION PHACO AND INTRAOCULAR LENS PLACEMENT (IOC);  Surgeon: Tonny Branch, MD;  [facility];  Service: Ophthalmology;   Laterality: Left;  CDE 17.60  . COLONOSCOPY    . COLONOSCOPY N/A 07/08/2015   Procedure: COLONOSCOPY;  Surgeon: Rogene Houston, MD;  [facility];  Service: Endoscopy;  Laterality: N/A;  930  . ESOPHAGEAL DILATION N/A 07/08/2015   Procedure: ESOPHAGEAL DILATION;  Surgeon: Rogene Houston, MD;  [facility];  Service: Endoscopy;  Laterality: N/A;  . ESOPHAGOGASTRODUODENOSCOPY N/A 07/08/2015   Procedure: ESOPHAGOGASTRODUODENOSCOPY (EGD);  Surgeon: Rogene Houston, MD;  [facility];  Service: Endoscopy;  Laterality: N/A;  . Hernia     Right inguinal  . HERNIA REPAIR  1961   right inguinal hernia  . UPPER GASTROINTESTINAL ENDOSCOPY      There were no vitals filed for this visit.  Subjective Assessment - 07/09/19 1137    Subjective  COVID 19 screening performed on patient upon arrival. Reports being busy cleaning out and organizing.    Pertinent History  Scoliosis, hiatial hernia, osteopenia, allergy to tape    Limitations  Sitting;House hold activities;Standing;Walking    How long can you stand comfortably?  20 minutes    How long can you walk comfortably?  20-30 minutes max    Diagnostic tests  x-ray    Patient Stated Goals  improve ability to perform home activities with less pain    Currently in Pain?  Yes  Pain Score  8     Pain Location  Back    Pain Orientation  Lower    Pain Descriptors / Indicators  Discomfort    Pain Type  Chronic pain    Pain Onset  More than a month ago    Pain Frequency  Constant         [facility] PT Assessment - 07/09/19 0001      Assessment   Medical Diagnosis  DDD, lumbar.    Referring Provider (PT)  Ronnie Doss DO.      Restrictions   Weight Bearing Restrictions  No                   [facility] Adult PT Treatment/Exercise - 07/09/19 0001      Lumbar Exercises: Aerobic   Nustep  L4  x73mwith focus on posture      Modalities   Modalities  Electrical Stimulation;Moist Heat      Moist Heat Therapy    Number Minutes Moist Heat  15 Minutes    Moist Heat Location  Lumbar Spine      Electrical Stimulation   Electrical Stimulation Location  Bil low back. paras    Electrical Stimulation Action  IFC    Electrical Stimulation Parameters  80-150 hz x15 min    Electrical Stimulation Goals  Pain;Tone      Manual Therapy   Manual Therapy  Soft tissue mobilization    Soft tissue mobilization  STW to R lumbar paraspinals, QL, superior glute, SI joint to reduce pain and tone                  PT Long Term Goals - 07/02/19 1738      PT LONG TERM GOAL #1   Title  Patient will be independent with an advanced HEP    Time  6    Period  Weeks    Status  On-going      PT LONG TERM GOAL #2   Title  Patient will report ability to perfrom ADLs with low back pain less than or equal to 4/10    Time  12    Period  Weeks    Status  On-going      PT LONG TERM GOAL #3   Title  Patient will demonstrate 4/5 or greater bilateral LE MMT to improve stability during functional tasks.    Time  6    Period  Weeks    Status  Not Met      PT LONG TERM GOAL #4   Title  Patient will report ability to stand for 20 minutes or greater with low back pain less than or equal to 5/10 to improve ability to perform home tasks.     Time  6    Period  Weeks    Status  Partially Met            Plan - 07/09/19 1202    Clinical Impression Statement  Patient presented in clinic after a lot of organizing and bending over with 8/10 LBP. Patient tender to manual therapy today over R QL and SI joint today. No complaints of pain during Nustep session. Normal modalities response noted following removal of the modalities.    Personal Factors and Comorbidities  Comorbidity 1;Comorbidity 2    Comorbidities  Scoliosis, hiatial hernia, osteopenia (T-score -2.5), allergy to tape    Stability/Clinical Decision Making  Evolving/Moderate complexity    Rehab Potential  Fair  PT Frequency  1x / week    PT Duration   12 weeks    PT Treatment/Interventions  ADLs/Self Care Home Management;Functional mobility training;Therapeutic activities;Therapeutic exercise;Passive range of motion;Manual techniques;Cryotherapy;Electrical Stimulation;Ultrasound    PT Next Visit Plan  Comprehensive ther ex program to include stretching, postural exercises, PRE exercises, core exercise program, neuro re-edu activities.    PT Home Exercise Plan  see patient education section    Consulted and Agree with Plan of Care  Patient       Patient will benefit from skilled therapeutic intervention in order to improve the following deficits and impairments:  Abnormal gait, Pain, Postural dysfunction, Decreased activity tolerance, Decreased range of motion, Decreased strength  Visit Diagnosis: Abnormal posture  Muscle weakness (generalized)     Problem List Patient Active Problem List   Diagnosis Date Noted  . Arthritis 06/13/2019  . GERD (gastroesophageal reflux disease) 06/10/2019  . Esophageal stricture 06/10/2019  . Educated about COVID-19 virus infection 06/04/2019  . SVT (supraventricular tachycardia) ([place]) 08/28/2018  . Elevated troponin 08/28/2018  . Light headedness 05/09/2018  . Carpal tunnel syndrome of right wrist 05/20/2015  . Primary localized osteoarthrosis, hand 05/20/2015  . Carpal tunnel syndrome on left 03/18/2015  . Osteopenia of the elderly 02/19/2014  . DDD (degenerative disc disease), lumbar 12/03/2013  . Scoliosis 12/03/2013  . Chronic LBP 05/14/2013  . IBS (irritable bowel syndrome) 11/08/2012  . UC (ulcerative colitis) ([place]) 04/23/2012  . Palpitation 10/26/2011  . Hypertension 10/26/2011  . Chronic ulcerative colitis ([place]) 03/23/2011    Standley Brooking, PTA 07/09/2019, 12:14 PM  [facility] [address] [place], [place], [address] Phone: [phone]   Fax:  [phone]  Name: Masie Bermingham MRN: [id number] Date of Birth: 1935-12-18

## 2019-07-16 ENCOUNTER — Ambulatory Visit: Payer: Medicare Other | Admitting: Physical Therapy

## 2019-07-16 ENCOUNTER — Encounter: Payer: Self-pay | Admitting: Physical Therapy

## 2019-07-16 ENCOUNTER — Other Ambulatory Visit (INDEPENDENT_AMBULATORY_CARE_PROVIDER_SITE_OTHER): Payer: Self-pay | Admitting: Internal Medicine

## 2019-07-16 ENCOUNTER — Other Ambulatory Visit: Payer: Self-pay

## 2019-07-16 DIAGNOSIS — R293 Abnormal posture: Secondary | ICD-10-CM

## 2019-07-16 DIAGNOSIS — M6281 Muscle weakness (generalized): Secondary | ICD-10-CM

## 2019-07-16 NOTE — Therapy (Signed)
[facility] [place], [place], [address] Phone: [phone]   Fax:  [phone]  Physical Therapy Treatment  Patient Details  Name: Lori Soto MRN: [id number] Date of Birth: [birth year] Referring Provider (PT): Ronnie Doss DO.   Encounter Date: 07/16/2019  PT End of Session - 07/16/19 1203    Visit Number  8    Number of Visits  12    Date for PT Re-Evaluation  08/26/19    PT Start Time  1119    PT Stop Time  1207    PT Time Calculation (min)  48 min    Activity Tolerance  Patient tolerated treatment well    Behavior During Therapy  [facility] for tasks assessed/performed       Past Medical History:  Diagnosis Date  . Anemia   . Arthritis   . Cancer ([place])    skin cancer on nose  . Carpal tunnel syndrome, bilateral   . Cataract   . Esophagitis   . GERD (gastroesophageal reflux disease)   . H/O hiatal hernia   . Osteopenia   . Scoliosis   . Shingles   . SVT (supraventricular tachycardia) ([place])   . Ulcerative colitis     Past Surgical History:  Procedure Laterality Date  . ABDOMINAL HYSTERECTOMY    . BUNIONECTOMY     Bilateral  . CARPAL TUNNEL RELEASE Left 03/10/2015   Procedure: LEFT CARPAL TUNNEL RELEASE;  Surgeon: Daryll Brod, MD;  Location: [facility];  Service: Orthopedics;  Laterality: Left;  . CARPAL TUNNEL RELEASE Right 10/31/2017   Procedure: RIGHT CARPAL TUNNEL RELEASE;  Surgeon: Daryll Brod, MD;  Location: [facility];  Service: Orthopedics;  Laterality: Right;  . CATARACT EXTRACTION W/PHACO  05/28/2012   Procedure: CATARACT EXTRACTION PHACO AND INTRAOCULAR LENS PLACEMENT (IOC);  Surgeon: Tonny Branch, MD;  [facility];  Service: Ophthalmology;  Laterality: Right;  CDE=12.84  . CATARACT EXTRACTION W/PHACO  06/07/2012   Procedure: CATARACT EXTRACTION PHACO AND INTRAOCULAR LENS PLACEMENT (IOC);  Surgeon: Tonny Branch, MD;  [facility];  Service: Ophthalmology;   Laterality: Left;  CDE 17.60  . COLONOSCOPY    . COLONOSCOPY N/A 07/08/2015   Procedure: COLONOSCOPY;  Surgeon: Rogene Houston, MD;  [facility];  Service: Endoscopy;  Laterality: N/A;  930  . ESOPHAGEAL DILATION N/A 07/08/2015   Procedure: ESOPHAGEAL DILATION;  Surgeon: Rogene Houston, MD;  [facility];  Service: Endoscopy;  Laterality: N/A;  . ESOPHAGOGASTRODUODENOSCOPY N/A 07/08/2015   Procedure: ESOPHAGOGASTRODUODENOSCOPY (EGD);  Surgeon: Rogene Houston, MD;  [facility];  Service: Endoscopy;  Laterality: N/A;  . Hernia     Right inguinal  . HERNIA REPAIR  1961   right inguinal hernia  . UPPER GASTROINTESTINAL ENDOSCOPY      There were no vitals filed for this visit.  Subjective Assessment - 07/16/19 1126    Subjective  COVID 19 screening performed on patient upon arrival. Reports that she has found ways within her house to stretch her back and is being more mindful of stopping for rest and her posture.    Pertinent History  Scoliosis, hiatial hernia, osteopenia, allergy to tape    Limitations  Sitting;House hold activities;Standing;Walking    How long can you stand comfortably?  20 minutes    How long can you walk comfortably?  20-30 minutes max    Diagnostic tests  x-ray    Patient Stated Goals  improve ability  to perform home activities with less pain    Currently in Pain?  Yes    Pain Location  Back    Pain Orientation  Lower    Pain Descriptors / Indicators  Discomfort    Pain Type  Chronic pain    Pain Onset  More than a month ago    Pain Frequency  Constant         [facility] PT Assessment - 07/16/19 0001      Assessment   Medical Diagnosis  DDD, lumbar.    Referring Provider (PT)  Ronnie Doss DO.                   [facility] Adult PT Treatment/Exercise - 07/16/19 0001      Lumbar Exercises: Aerobic   Nustep  L4 x15 min      Lumbar Exercises: Machines for Strengthening   Cybex Lumbar Extension  60# x20 reps    Other  Lumbar Machine Exercise  Lat pulldown 20# x20 reps      Modalities   Modalities  Electrical Stimulation;Moist Heat      Moist Heat Therapy   Number Minutes Moist Heat  15 Minutes    Moist Heat Location  Lumbar Spine      Electrical Stimulation   Electrical Stimulation Location  Bil low back. paras    Electrical Stimulation Action  IFC    Electrical Stimulation Parameters  80-150 hz x15 min    Electrical Stimulation Goals  Pain;Tone      Manual Therapy   Manual Therapy  Soft tissue mobilization    Soft tissue mobilization  STW to R lumbar paraspinals, QL, superior glute, SI joint to reduce pain and tone                  PT Long Term Goals - 07/02/19 1738      PT LONG TERM GOAL #1   Title  Patient will be independent with an advanced HEP    Time  6    Period  Weeks    Status  On-going      PT LONG TERM GOAL #2   Title  Patient will report ability to perfrom ADLs with low back pain less than or equal to 4/10    Time  12    Period  Weeks    Status  On-going      PT LONG TERM GOAL #3   Title  Patient will demonstrate 4/5 or greater bilateral LE MMT to improve stability during functional tasks.    Time  6    Period  Weeks    Status  Not Met      PT LONG TERM GOAL #4   Title  Patient will report ability to stand for 20 minutes or greater with low back pain less than or equal to 5/10 to improve ability to perform home tasks.     Time  6    Period  Weeks    Status  Partially Met            Plan - 07/16/19 1203    Clinical Impression Statement  Patient presented in clinic with reports of stretching in her home and being more aware of her posture. Patient also reports being more mindful of activities and stopping for rest breaks as she fatigues with prolonged activities. Patient guided through low level lumbar strengthening with no reports of pain and constant observation. Only minimal increased tone notable in R QL today.  Normal modalities response noted  following removal of the modalities.    Personal Factors and Comorbidities  Comorbidity 1;Comorbidity 2    Comorbidities  Scoliosis, hiatial hernia, osteopenia (T-score -2.5), allergy to tape    Stability/Clinical Decision Making  Evolving/Moderate complexity    Rehab Potential  Fair    PT Frequency  1x / week    PT Duration  12 weeks    PT Treatment/Interventions  ADLs/Self Care Home Management;Functional mobility training;Therapeutic activities;Therapeutic exercise;Passive range of motion;Manual techniques;Cryotherapy;Electrical Stimulation;Ultrasound    PT Next Visit Plan  Comprehensive ther ex program to include stretching, postural exercises, PRE exercises, core exercise program, neuro re-edu activities.    PT Home Exercise Plan  see patient education section    Consulted and Agree with Plan of Care  Patient       Patient will benefit from skilled therapeutic intervention in order to improve the following deficits and impairments:  Abnormal gait, Pain, Postural dysfunction, Decreased activity tolerance, Decreased range of motion, Decreased strength  Visit Diagnosis: Abnormal posture  Muscle weakness (generalized)     Problem List Patient Active Problem List   Diagnosis Date Noted  . Arthritis 06/13/2019  . GERD (gastroesophageal reflux disease) 06/10/2019  . Esophageal stricture 06/10/2019  . Educated about COVID-19 virus infection 06/04/2019  . SVT (supraventricular tachycardia) ([place]) 08/28/2018  . Elevated troponin 08/28/2018  . Light headedness 05/09/2018  . Carpal tunnel syndrome of right wrist 05/20/2015  . Primary localized osteoarthrosis, hand 05/20/2015  . Carpal tunnel syndrome on left 03/18/2015  . Osteopenia of the elderly 02/19/2014  . DDD (degenerative disc disease), lumbar 12/03/2013  . Scoliosis 12/03/2013  . Chronic LBP 05/14/2013  . IBS (irritable bowel syndrome) 11/08/2012  . UC (ulcerative colitis) ([place]) 04/23/2012  . Palpitation 10/26/2011  .  Hypertension 10/26/2011  . Chronic ulcerative colitis ([place]) 03/23/2011    Standley Brooking, PTA 07/16/2019, 12:10 PM  [facility] [address] [place], [place], [address] Phone: [phone]   Fax:  [phone]  Name: Lori Soto MRN: [id number] Date of Birth: Aug 28, 1935

## 2019-07-23 ENCOUNTER — Other Ambulatory Visit: Payer: Self-pay

## 2019-07-23 ENCOUNTER — Ambulatory Visit: Payer: Medicare Other | Admitting: *Deleted

## 2019-07-23 DIAGNOSIS — M546 Pain in thoracic spine: Secondary | ICD-10-CM

## 2019-07-23 DIAGNOSIS — G8929 Other chronic pain: Secondary | ICD-10-CM

## 2019-07-23 DIAGNOSIS — R293 Abnormal posture: Secondary | ICD-10-CM

## 2019-07-23 DIAGNOSIS — M6281 Muscle weakness (generalized): Secondary | ICD-10-CM

## 2019-07-23 DIAGNOSIS — M545 Low back pain, unspecified: Secondary | ICD-10-CM

## 2019-07-23 NOTE — Therapy (Signed)
[facility] [place], [place], [address] Phone: [phone]   Fax:  [phone]  Physical Therapy Treatment  Patient Details  Name: Lori Soto MRN: [id number] Date of Birth: 11-Mar-1936 Referring Provider (PT): Ronnie Doss DO.   Encounter Date: 07/23/2019  PT End of Session - 07/23/19 1208    Visit Number  9    Number of Visits  12    Date for PT Re-Evaluation  08/26/19    PT Start Time  1119    PT Stop Time  1210    PT Time Calculation (min)  51 min       Past Medical History:  Diagnosis Date  . Anemia   . Arthritis   . Cancer ([place])    skin cancer on nose  . Carpal tunnel syndrome, bilateral   . Cataract   . Esophagitis   . GERD (gastroesophageal reflux disease)   . H/O hiatal hernia   . Osteopenia   . Scoliosis   . Shingles   . SVT (supraventricular tachycardia) ([place])   . Ulcerative colitis     Past Surgical History:  Procedure Laterality Date  . ABDOMINAL HYSTERECTOMY    . BUNIONECTOMY     Bilateral  . CARPAL TUNNEL RELEASE Left 03/10/2015   Procedure: LEFT CARPAL TUNNEL RELEASE;  Surgeon: Daryll Brod, MD;  Location: [facility];  Service: Orthopedics;  Laterality: Left;  . CARPAL TUNNEL RELEASE Right 10/31/2017   Procedure: RIGHT CARPAL TUNNEL RELEASE;  Surgeon: Daryll Brod, MD;  Location: [facility];  Service: Orthopedics;  Laterality: Right;  . CATARACT EXTRACTION W/PHACO  05/28/2012   Procedure: CATARACT EXTRACTION PHACO AND INTRAOCULAR LENS PLACEMENT (IOC);  Surgeon: Tonny Branch, MD;  [facility];  Service: Ophthalmology;  Laterality: Right;  CDE=12.84  . CATARACT EXTRACTION W/PHACO  06/07/2012   Procedure: CATARACT EXTRACTION PHACO AND INTRAOCULAR LENS PLACEMENT (IOC);  Surgeon: Tonny Branch, MD;  [facility];  Service: Ophthalmology;  Laterality: Left;  CDE 17.60  . COLONOSCOPY    . COLONOSCOPY N/A 07/08/2015   Procedure: COLONOSCOPY;  Surgeon: Rogene [place], MD;  [facility];  Service: Endoscopy;  Laterality: N/A;  930  . ESOPHAGEAL DILATION N/A 07/08/2015   Procedure: ESOPHAGEAL DILATION;  Surgeon: Rogene Houston, MD;  [facility];  Service: Endoscopy;  Laterality: N/A;  . ESOPHAGOGASTRODUODENOSCOPY N/A 07/08/2015   Procedure: ESOPHAGOGASTRODUODENOSCOPY (EGD);  Surgeon: Rogene Houston, MD;  [facility];  Service: Endoscopy;  Laterality: N/A;  . Hernia     Right inguinal  . HERNIA REPAIR  1961   right inguinal hernia  . UPPER GASTROINTESTINAL ENDOSCOPY      There were no vitals filed for this visit.  Subjective Assessment - 07/23/19 1206    Subjective  COVID 19 screening performed on patient upon arrival. Pain not as bad this morning.    Pertinent History  Scoliosis, hiatial hernia, osteopenia, allergy to tape    Limitations  Sitting;House hold activities;Standing;Walking    How long can you stand comfortably?  20 minutes    How long can you walk comfortably?  20-30 minutes max    Diagnostic tests  x-ray    Patient Stated Goals  improve ability to perform home activities with less pain    Currently in Pain?  Yes    Pain Score  6     Pain Location  Back    Pain Orientation  Lower  Pain Descriptors / Indicators  Tightness;Discomfort    Pain Type  Chronic pain    Pain Onset  More than a month ago                       [facility] Adult PT Treatment/Exercise - 07/23/19 0001      Lumbar Exercises: Aerobic   Nustep  L4 x15 min      Lumbar Exercises: Machines for Strengthening   Cybex Lumbar Extension  60# x20 reps    Other Lumbar Machine Exercise  Lat pulldown 20# x20 reps      Modalities   Modalities  Electrical Stimulation;Moist Heat      Moist Heat Therapy   Number Minutes Moist Heat  15 Minutes    Moist Heat Location  Lumbar Spine      Electrical Stimulation   Electrical Stimulation Location  Bil low back. paras    Electrical Stimulation Action  IFC    Electrical  Stimulation Parameters  80-150hz  x 15 mins    Electrical Stimulation Goals  Pain;Tone      Manual Therapy   Manual Therapy  Soft tissue mobilization    Manual therapy comments  prone position    Soft tissue mobilization  STW to R/L lumbar paraspinals,and  QLs to reduce tone                  PT Long Term Goals - 07/02/19 1738      PT LONG TERM GOAL #1   Title  Patient will be independent with an advanced HEP    Time  6    Period  Weeks    Status  On-going      PT LONG TERM GOAL #2   Title  Patient will report ability to perfrom ADLs with low back pain less than or equal to 4/10    Time  12    Period  Weeks    Status  On-going      PT LONG TERM GOAL #3   Title  Patient will demonstrate 4/5 or greater bilateral LE MMT to improve stability during functional tasks.    Time  6    Period  Weeks    Status  Not Met      PT LONG TERM GOAL #4   Title  Patient will report ability to stand for 20 minutes or greater with low back pain less than or equal to 5/10 to improve ability to perform home tasks.     Time  6    Period  Weeks    Status  Partially Met            Plan - 07/23/19 1210    Clinical Impression Statement  Pt arrived today doing a little better with less pain. She was able to perform core strengthening exs today without complaints and feels that it is helping for ADL's. Still sore with STW RT greater than Left especially in Ql. Normal modality response    Personal Factors and Comorbidities  Comorbidity 1;Comorbidity 2    Comorbidities  Scoliosis, hiatial hernia, osteopenia (T-score -2.5), allergy to tape    Stability/Clinical Decision Making  Evolving/Moderate complexity    Rehab Potential  Fair    PT Frequency  1x / week    PT Duration  12 weeks    PT Treatment/Interventions  ADLs/Self Care Home Management;Functional mobility training;Therapeutic activities;Therapeutic exercise;Passive range of motion;Manual techniques;Cryotherapy;Electrical  Stimulation;Ultrasound    PT Next Visit Plan  Comprehensive ther ex program to include stretching, postural exercises, PRE exercises, core exercise program, neuro re-edu activities.    PT Home Exercise Plan  see patient education section    Consulted and Agree with Plan of Care  Patient       Patient will benefit from skilled therapeutic intervention in order to improve the following deficits and impairments:  Abnormal gait, Pain, Postural dysfunction, Decreased activity tolerance, Decreased range of motion, Decreased strength  Visit Diagnosis: Abnormal posture  Muscle weakness (generalized)  Pain in thoracic spine  Chronic right-sided low back pain without sciatica  Chronic bilateral low back pain without sciatica     Problem List Patient Active Problem List   Diagnosis Date Noted  . Arthritis 06/13/2019  . GERD (gastroesophageal reflux disease) 06/10/2019  . Esophageal stricture 06/10/2019  . Educated about COVID-19 virus infection 06/04/2019  . SVT (supraventricular tachycardia) ([place]) 08/28/2018  . Elevated troponin 08/28/2018  . Light headedness 05/09/2018  . Carpal tunnel syndrome of right wrist 05/20/2015  . Primary localized osteoarthrosis, hand 05/20/2015  . Carpal tunnel syndrome on left 03/18/2015  . Osteopenia of the elderly 02/19/2014  . DDD (degenerative disc disease), lumbar 12/03/2013  . Scoliosis 12/03/2013  . Chronic LBP 05/14/2013  . IBS (irritable bowel syndrome) 11/08/2012  . UC (ulcerative colitis) ([place]) 04/23/2012  . Palpitation 10/26/2011  . Hypertension 10/26/2011  . Chronic ulcerative colitis ([place]) 03/23/2011    Abra Lingenfelter,CHRIS, PTA 07/23/2019, 12:15 PM  [facility] [address] [place], [place], [address] Phone: [phone]   Fax:  [phone]  Name: Lori Soto MRN: [id number] Date of Birth: 09/02/1935

## 2019-07-30 ENCOUNTER — Other Ambulatory Visit: Payer: Self-pay

## 2019-07-30 ENCOUNTER — Ambulatory Visit: Payer: Medicare Other | Admitting: *Deleted

## 2019-07-30 DIAGNOSIS — G8929 Other chronic pain: Secondary | ICD-10-CM

## 2019-07-30 DIAGNOSIS — R293 Abnormal posture: Secondary | ICD-10-CM

## 2019-07-30 DIAGNOSIS — M6281 Muscle weakness (generalized): Secondary | ICD-10-CM

## 2019-07-30 DIAGNOSIS — M546 Pain in thoracic spine: Secondary | ICD-10-CM

## 2019-07-30 DIAGNOSIS — M545 Low back pain, unspecified: Secondary | ICD-10-CM

## 2019-07-30 NOTE — Therapy (Signed)
[facility] [place], [place], [address] Phone: [phone]   Fax:  [phone]  Physical Therapy Treatment  Patient Details  Name: Lori Soto MRN: [id number] Date of Birth: 1936-03-24 Referring Provider (PT): Ronnie Doss DO.   Encounter Date: 07/30/2019  PT End of Session - 07/30/19 1223    Visit Number  10    Number of Visits  12    Date for PT Re-Evaluation  08/26/19    PT Start Time  1120    PT Stop Time  1218    PT Time Calculation (min)  58 min    Activity Tolerance  Patient tolerated treatment well    Behavior During Therapy  [facility] for tasks assessed/performed       Past Medical History:  Diagnosis Date  . Anemia   . Arthritis   . Cancer ([place])    skin cancer on nose  . Carpal tunnel syndrome, bilateral   . Cataract   . Esophagitis   . GERD (gastroesophageal reflux disease)   . H/O hiatal hernia   . Osteopenia   . Scoliosis   . Shingles   . SVT (supraventricular tachycardia) ([place])   . Ulcerative colitis     Past Surgical History:  Procedure Laterality Date  . ABDOMINAL HYSTERECTOMY    . BUNIONECTOMY     Bilateral  . CARPAL TUNNEL RELEASE Left 03/10/2015   Procedure: LEFT CARPAL TUNNEL RELEASE;  Surgeon: Daryll Brod, MD;  Location: [facility];  Service: Orthopedics;  Laterality: Left;  . CARPAL TUNNEL RELEASE Right 10/31/2017   Procedure: RIGHT CARPAL TUNNEL RELEASE;  Surgeon: Daryll Brod, MD;  Location: [facility];  Service: Orthopedics;  Laterality: Right;  . CATARACT EXTRACTION W/PHACO  05/28/2012   Procedure: CATARACT EXTRACTION PHACO AND INTRAOCULAR LENS PLACEMENT (IOC);  Surgeon: Tonny Branch, MD;  [facility];  Service: Ophthalmology;  Laterality: Right;  CDE=12.84  . CATARACT EXTRACTION W/PHACO  06/07/2012   Procedure: CATARACT EXTRACTION PHACO AND INTRAOCULAR LENS PLACEMENT (IOC);  Surgeon: Tonny Branch, MD;  [facility];  Service: Ophthalmology;   Laterality: Left;  CDE 17.60  . COLONOSCOPY    . COLONOSCOPY N/A 07/08/2015   Procedure: COLONOSCOPY;  Surgeon: Rogene Houston, MD;  [facility];  Service: Endoscopy;  Laterality: N/A;  930  . ESOPHAGEAL DILATION N/A 07/08/2015   Procedure: ESOPHAGEAL DILATION;  Surgeon: Rogene Houston, MD;  [facility];  Service: Endoscopy;  Laterality: N/A;  . ESOPHAGOGASTRODUODENOSCOPY N/A 07/08/2015   Procedure: ESOPHAGOGASTRODUODENOSCOPY (EGD);  Surgeon: Rogene Houston, MD;  [facility];  Service: Endoscopy;  Laterality: N/A;  . Hernia     Right inguinal  . HERNIA REPAIR  1961   right inguinal hernia  . UPPER GASTROINTESTINAL ENDOSCOPY      There were no vitals filed for this visit.  Subjective Assessment - 07/30/19 1217    Subjective  COVID 19 screening performed on patient upon arrival. Pain not as bad this morning. 5/10    Pertinent History  Scoliosis, hiatial hernia, osteopenia, allergy to tape    Limitations  Sitting;House hold activities;Standing;Walking    How long can you stand comfortably?  20 minutes    How long can you walk comfortably?  20-30 minutes max    Diagnostic tests  x-ray    Patient Stated Goals  improve ability to perform home activities with less pain    Currently in Pain?  Yes  Pain Score  5     Pain Location  Back    Pain Orientation  Lower    Pain Descriptors / Indicators  Tightness    Pain Type  Chronic pain    Pain Onset  More than a month ago                       [facility] Adult PT Treatment/Exercise - 07/30/19 0001      Lumbar Exercises: Aerobic   Nustep  L4 x15 min      Lumbar Exercises: Machines for Strengthening   Cybex Lumbar Extension  60# x30 reps    Other Lumbar Machine Exercise  Lat pulldown 20# 3x20 reps      Modalities   Modalities  Electrical Stimulation;Moist Heat      Moist Heat Therapy   Number Minutes Moist Heat  15 Minutes    Moist Heat Location  Lumbar Spine      Electrical Stimulation    Electrical Stimulation Location  Bil low back. paras    Electrical Stimulation Action  IFC    Electrical Stimulation Parameters  80-150hz  x 15 mins    Electrical Stimulation Goals  Pain;Tone      Manual Therapy   Manual Therapy  Soft tissue mobilization    Manual therapy comments  prone position    Soft tissue mobilization  STW to R/L lumbar paraspinals,and  QLs to reduce tone                  PT Long Term Goals - 07/02/19 1738      PT LONG TERM GOAL #1   Title  Patient will be independent with an advanced HEP    Time  6    Period  Weeks    Status  On-going      PT LONG TERM GOAL #2   Title  Patient will report ability to perfrom ADLs with low back pain less than or equal to 4/10    Time  12    Period  Weeks    Status  On-going      PT LONG TERM GOAL #3   Title  Patient will demonstrate 4/5 or greater bilateral LE MMT to improve stability during functional tasks.    Time  6    Period  Weeks    Status  Not Met      PT LONG TERM GOAL #4   Title  Patient will report ability to stand for 20 minutes or greater with low back pain less than or equal to 5/10 to improve ability to perform home tasks.     Time  6    Period  Weeks    Status  Partially Met            Plan - 07/30/19 1216    Clinical Impression Statement  Pt arrived today doing fairly well with 5/10 LBP. She was able to progress sets/reps with core strengthening exs today without complaints and just fatigue.  Still sore with STW RT side QL.    Personal Factors and Comorbidities  Comorbidity 1;Comorbidity 2    Comorbidities  Scoliosis, hiatial hernia, osteopenia (T-score -2.5), allergy to tape    Stability/Clinical Decision Making  Evolving/Moderate complexity    Rehab Potential  Fair    PT Frequency  1x / week    PT Duration  12 weeks    PT Treatment/Interventions  ADLs/Self Care Home Management;Functional mobility training;Therapeutic activities;Therapeutic exercise;Passive range of  motion;Manual techniques;Cryotherapy;Electrical Stimulation;Ultrasound    PT Next Visit Plan  Comprehensive ther ex program to include stretching, postural exercises, PRE exercises, core exercise program, neuro re-edu activities.       Patient will benefit from skilled therapeutic intervention in order to improve the following deficits and impairments:  Abnormal gait, Pain, Postural dysfunction, Decreased activity tolerance, Decreased range of motion, Decreased strength  Visit Diagnosis: Abnormal posture  Muscle weakness (generalized)  Pain in thoracic spine  Chronic right-sided low back pain without sciatica  Chronic bilateral low back pain without sciatica     Problem List Patient Active Problem List   Diagnosis Date Noted  . Arthritis 06/13/2019  . GERD (gastroesophageal reflux disease) 06/10/2019  . Esophageal stricture 06/10/2019  . Educated about COVID-19 virus infection 06/04/2019  . SVT (supraventricular tachycardia) ([place]) 08/28/2018  . Elevated troponin 08/28/2018  . Light headedness 05/09/2018  . Carpal tunnel syndrome of right wrist 05/20/2015  . Primary localized osteoarthrosis, hand 05/20/2015  . Carpal tunnel syndrome on left 03/18/2015  . Osteopenia of the elderly 02/19/2014  . DDD (degenerative disc disease), lumbar 12/03/2013  . Scoliosis 12/03/2013  . Chronic LBP 05/14/2013  . IBS (irritable bowel syndrome) 11/08/2012  . UC (ulcerative colitis) ([place]) 04/23/2012  . Palpitation 10/26/2011  . Hypertension 10/26/2011  . Chronic ulcerative colitis ([place]) 03/23/2011    Feliciano Wynter,CHRIS, PTA 07/30/2019, 12:25 PM  [facility] [address] [place], [place], [address] Phone: [phone]   Fax:  [phone]  Name: Dorsie Sethi MRN: [id number] Date of Birth: 1936/02/02

## 2019-08-06 ENCOUNTER — Other Ambulatory Visit: Payer: Self-pay

## 2019-08-06 ENCOUNTER — Ambulatory Visit: Payer: Medicare Other | Attending: Family Medicine | Admitting: Physical Therapy

## 2019-08-06 DIAGNOSIS — R293 Abnormal posture: Secondary | ICD-10-CM | POA: Diagnosis not present

## 2019-08-06 DIAGNOSIS — M6281 Muscle weakness (generalized): Secondary | ICD-10-CM | POA: Insufficient documentation

## 2019-08-06 DIAGNOSIS — M545 Low back pain, unspecified: Secondary | ICD-10-CM

## 2019-08-06 DIAGNOSIS — G8929 Other chronic pain: Secondary | ICD-10-CM | POA: Diagnosis present

## 2019-08-06 DIAGNOSIS — M546 Pain in thoracic spine: Secondary | ICD-10-CM | POA: Diagnosis present

## 2019-08-06 NOTE — Therapy (Signed)
[facility] [place], [place], [address] Phone: [phone]   Fax:  [phone]  Physical Therapy Treatment  Patient Details  Name: Lori Soto MRN: [id number] Date of Birth: 1935-07-29 Referring Provider (PT): Ronnie Doss DO.   Encounter Date: 08/06/2019  PT End of Session - 08/06/19 1204    Visit Number  11    Number of Visits  12    Date for PT Re-Evaluation  08/26/19    PT Start Time  1132   Late for treatment.   PT Stop Time  1204    PT Time Calculation (min)  32 min    Activity Tolerance  Patient tolerated treatment well    Behavior During Therapy  [facility] for tasks assessed/performed       Past Medical History:  Diagnosis Date  . Anemia   . Arthritis   . Cancer ([place])    skin cancer on nose  . Carpal tunnel syndrome, bilateral   . Cataract   . Esophagitis   . GERD (gastroesophageal reflux disease)   . H/O hiatal hernia   . Osteopenia   . Scoliosis   . Shingles   . SVT (supraventricular tachycardia) ([place])   . Ulcerative colitis     Past Surgical History:  Procedure Laterality Date  . ABDOMINAL HYSTERECTOMY    . BUNIONECTOMY     Bilateral  . CARPAL TUNNEL RELEASE Left 03/10/2015   Procedure: LEFT CARPAL TUNNEL RELEASE;  Surgeon: Daryll Brod, MD;  Location: [place];  Service: Orthopedics;  Laterality: Left;  . CARPAL TUNNEL RELEASE Right 10/31/2017   Procedure: RIGHT CARPAL TUNNEL RELEASE;  Surgeon: Daryll Brod, MD;  Location: [facility];  Service: Orthopedics;  Laterality: Right;  . CATARACT EXTRACTION W/PHACO  05/28/2012   Procedure: CATARACT EXTRACTION PHACO AND INTRAOCULAR LENS PLACEMENT (IOC);  Surgeon: Tonny Branch, MD;  [facility];  Service: Ophthalmology;  Laterality: Right;  CDE=12.84  . CATARACT EXTRACTION W/PHACO  06/07/2012   Procedure: CATARACT EXTRACTION PHACO AND INTRAOCULAR LENS PLACEMENT (IOC);  Surgeon: Tonny Branch, MD;  [facility]: [facility];  Service:  Ophthalmology;  Laterality: Left;  CDE 17.60  . COLONOSCOPY    . COLONOSCOPY N/A 07/08/2015   Procedure: COLONOSCOPY;  Surgeon: Rogene Houston, MD;  [facility];  Service: Endoscopy;  Laterality: N/A;  930  . ESOPHAGEAL DILATION N/A 07/08/2015   Procedure: ESOPHAGEAL DILATION;  Surgeon: Rogene Houston, MD;  [facility];  Service: Endoscopy;  Laterality: N/A;  . ESOPHAGOGASTRODUODENOSCOPY N/A 07/08/2015   Procedure: ESOPHAGOGASTRODUODENOSCOPY (EGD);  Surgeon: Rogene Houston, MD;  [facility];  Service: Endoscopy;  Laterality: N/A;  . Hernia     Right inguinal  . HERNIA REPAIR  1961   right inguinal hernia  . UPPER GASTROINTESTINAL ENDOSCOPY      There were no vitals filed for this visit.  Subjective Assessment - 08/06/19 1200    Subjective  COVID-19 screen performed prior to patient entering clinic.  Running late.  Worked in the yard.  Back hurts.    Pertinent History  Scoliosis, hiatial hernia, osteopenia, allergy to tape    Limitations  Sitting;House hold activities;Standing;Walking    How long can you stand comfortably?  20 minutes    How long can you walk comfortably?  20-30 minutes max    Diagnostic tests  x-ray    Patient Stated Goals  improve ability to perform home activities with less pain  Currently in Pain?  Yes    Pain Score  7     Pain Location  Back    Pain Orientation  Lower    Pain Descriptors / Indicators  Tightness    Pain Type  Chronic pain    Pain Onset  More than a month ago                       [facility] Adult PT Treatment/Exercise - 08/06/19 0001      Manual Therapy   Manual Therapy  Soft tissue mobilization    Soft tissue mobilization  STW/M x 23 minutes to patient's lumbar and thoracic region to decrease tone.  TP release technique ultilized.                  PT Long Term Goals - 07/02/19 1738      PT LONG TERM GOAL #1   Title  Patient will be independent with an advanced HEP    Time  6     Period  Weeks    Status  On-going      PT LONG TERM GOAL #2   Title  Patient will report ability to perfrom ADLs with low back pain less than or equal to 4/10    Time  12    Period  Weeks    Status  On-going      PT LONG TERM GOAL #3   Title  Patient will demonstrate 4/5 or greater bilateral LE MMT to improve stability during functional tasks.    Time  6    Period  Weeks    Status  Not Met      PT LONG TERM GOAL #4   Title  Patient will report ability to stand for 20 minutes or greater with low back pain less than or equal to 5/10 to improve ability to perform home tasks.     Time  6    Period  Weeks    Status  Partially Met            Plan - 08/06/19 1205    Clinical Impression Statement  Patient had an increase in pain due to yardwork but she responded very well to STW/M and felt better better following.    Personal Factors and Comorbidities  Comorbidity 1;Comorbidity 2    Comorbidities  Scoliosis, hiatial hernia, osteopenia (T-score -2.5), allergy to tape    Rehab Potential  Fair    PT Frequency  1x / week    PT Duration  12 weeks    PT Treatment/Interventions  ADLs/Self Care Home Management;Functional mobility training;Therapeutic activities;Therapeutic exercise;Passive range of motion;Manual techniques;Cryotherapy;Electrical Stimulation;Ultrasound    PT Next Visit Plan  Comprehensive ther ex program to include stretching, postural exercises, PRE exercises, core exercise program, neuro re-edu activities.    PT Home Exercise Plan  see patient education section    Consulted and Agree with Plan of Care  Patient       Patient will benefit from skilled therapeutic intervention in order to improve the following deficits and impairments:  Abnormal gait, Pain, Postural dysfunction, Decreased activity tolerance, Decreased range of motion, Decreased strength  Visit Diagnosis: Abnormal posture  Muscle weakness (generalized)  Pain in thoracic spine  Chronic bilateral low  back pain without sciatica  Chronic right-sided low back pain without sciatica     Problem List Patient Active Problem List   Diagnosis Date Noted  . Arthritis 06/13/2019  . GERD (gastroesophageal reflux  disease) 06/10/2019  . Esophageal stricture 06/10/2019  . Educated about COVID-19 virus infection 06/04/2019  . SVT (supraventricular tachycardia) ([place]) 08/28/2018  . Elevated troponin 08/28/2018  . Light headedness 05/09/2018  . Carpal tunnel syndrome of right wrist 05/20/2015  . Primary localized osteoarthrosis, hand 05/20/2015  . Carpal tunnel syndrome on left 03/18/2015  . Osteopenia of the elderly 02/19/2014  . DDD (degenerative disc disease), lumbar 12/03/2013  . Scoliosis 12/03/2013  . Chronic LBP 05/14/2013  . IBS (irritable bowel syndrome) 11/08/2012  . UC (ulcerative colitis) ([place]) 04/23/2012  . Palpitation 10/26/2011  . Hypertension 10/26/2011  . Chronic ulcerative colitis ([place]) 03/23/2011    Jyllian Haynie, [place] MPT 08/06/2019, 12:06 PM  [facility] [address] [place], [place], [address] Phone: [phone]   Fax:  [phone]  Name: Lori Soto MRN: [id number] Date of Birth: 08-17-1935

## 2019-08-13 ENCOUNTER — Ambulatory Visit: Payer: Medicare Other | Admitting: Physical Therapy

## 2019-08-13 ENCOUNTER — Other Ambulatory Visit: Payer: Self-pay

## 2019-08-13 DIAGNOSIS — R293 Abnormal posture: Secondary | ICD-10-CM | POA: Diagnosis not present

## 2019-08-13 DIAGNOSIS — M6281 Muscle weakness (generalized): Secondary | ICD-10-CM

## 2019-08-13 NOTE — Therapy (Signed)
[facility] [place], [place], [address] Phone: [phone]   Fax:  [phone]  Physical Therapy Treatment  Patient Details  Name: Lori Soto MRN: [id number] Date of Birth: [birth year] Referring Provider (PT): Ronnie Doss DO.   Encounter Date: 08/13/2019  PT End of Session - 08/13/19 1158    Visit Number  12    Number of Visits  12    Date for PT Re-Evaluation  08/26/19    PT Start Time  1115    PT Stop Time  1203    PT Time Calculation (min)  48 min    Activity Tolerance  Patient tolerated treatment well    Behavior During Therapy  [facility] for tasks assessed/performed       Past Medical History:  Diagnosis Date  . Anemia   . Arthritis   . Cancer ([place])    skin cancer on nose  . Carpal tunnel syndrome, bilateral   . Cataract   . Esophagitis   . GERD (gastroesophageal reflux disease)   . H/O hiatal hernia   . Osteopenia   . Scoliosis   . Shingles   . SVT (supraventricular tachycardia) ([place])   . Ulcerative colitis     Past Surgical History:  Procedure Laterality Date  . ABDOMINAL HYSTERECTOMY    . BUNIONECTOMY     Bilateral  . CARPAL TUNNEL RELEASE Left 03/10/2015   Procedure: LEFT CARPAL TUNNEL RELEASE;  Surgeon: Daryll Brod, MD;  Location: [facility];  Service: Orthopedics;  Laterality: Left;  . CARPAL TUNNEL RELEASE Right 10/31/2017   Procedure: RIGHT CARPAL TUNNEL RELEASE;  Surgeon: Daryll Brod, MD;  Location: [facility];  Service: Orthopedics;  Laterality: Right;  . CATARACT EXTRACTION W/PHACO  05/28/2012   Procedure: CATARACT EXTRACTION PHACO AND INTRAOCULAR LENS PLACEMENT (IOC);  Surgeon: Tonny Branch, MD;  [facility];  Service: Ophthalmology;  Laterality: Right;  CDE=12.84  . CATARACT EXTRACTION W/PHACO  06/07/2012   Procedure: CATARACT EXTRACTION PHACO AND INTRAOCULAR LENS PLACEMENT (IOC);  Surgeon: Tonny Branch, MD;  [facility];  Service: Ophthalmology;   Laterality: Left;  CDE 17.60  . COLONOSCOPY    . COLONOSCOPY N/A 07/08/2015   Procedure: COLONOSCOPY;  Surgeon: Rogene Houston, MD;  [facility];  Service: Endoscopy;  Laterality: N/A;  930  . ESOPHAGEAL DILATION N/A 07/08/2015   Procedure: ESOPHAGEAL DILATION;  Surgeon: Rogene Houston, MD;  [facility];  Service: Endoscopy;  Laterality: N/A;  . ESOPHAGOGASTRODUODENOSCOPY N/A 07/08/2015   Procedure: ESOPHAGOGASTRODUODENOSCOPY (EGD);  Surgeon: Rogene Houston, MD;  [facility];  Service: Endoscopy;  Laterality: N/A;  . Hernia     Right inguinal  . HERNIA REPAIR  1961   right inguinal hernia  . UPPER GASTROINTESTINAL ENDOSCOPY      There were no vitals filed for this visit.  Subjective Assessment - 08/13/19 1153    Subjective  COVID-19 screen performed prior to patient entering clinic.  Up cooking a lot last weekend so back hurt.  Not too bad this morning.    Pertinent History  Scoliosis, hiatial hernia, osteopenia, allergy to tape    Limitations  Sitting;House hold activities;Standing;Walking    How long can you stand comfortably?  20 minutes    How long can you walk comfortably?  20-30 minutes max    Diagnostic tests  x-ray    Patient Stated Goals  improve ability to perform home activities with less pain  Currently in Pain?  Yes    Pain Location  Back    Pain Orientation  Lower    Pain Descriptors / Indicators  Tightness    Pain Onset  More than a month ago                       [facility] Adult PT Treatment/Exercise - 08/13/19 0001      Exercises   Exercises  Knee/Hip      Lumbar Exercises: Aerobic   Nustep  Level 4 x 15 minutes.      Lumbar Exercises: Machines for Strengthening   Other Lumbar Machine Exercise  Lat PD with 30# 4 sets to fatigue.      Modalities   Modalities  Electrical Stimulation;Moist Heat      Moist Heat Therapy   Number Minutes Moist Heat  15 Minutes    Moist Heat Location  Lumbar Spine       Electrical Stimulation   Electrical Stimulation Location  Right low back.    Electrical Stimulation Action  Pre-mod.    Electrical Stimulation Parameters  80-150 Hz x 15 minutes.    Electrical Stimulation Goals  Tone;Pain      Manual Therapy   Manual Therapy  Soft tissue mobilization    Soft tissue mobilization  STW/M x 6 minutes with focus on right QL to release tone.                  PT Long Term Goals - 07/02/19 1738      PT LONG TERM GOAL #1   Title  Patient will be independent with an advanced HEP    Time  6    Period  Weeks    Status  On-going      PT LONG TERM GOAL #2   Title  Patient will report ability to perfrom ADLs with low back pain less than or equal to 4/10    Time  12    Period  Weeks    Status  On-going      PT LONG TERM GOAL #3   Title  Patient will demonstrate 4/5 or greater bilateral LE MMT to improve stability during functional tasks.    Time  6    Period  Weeks    Status  Not Met      PT LONG TERM GOAL #4   Title  Patient will report ability to stand for 20 minutes or greater with low back pain less than or equal to 5/10 to improve ability to perform home tasks.     Time  6    Period  Weeks    Status  Partially Met            Plan - 08/13/19 1156    Clinical Impression Statement  Patient responded well to treatment focused on her right QL.  Added resistance to lat PD exercise.  Patient performed without complaint.    Personal Factors and Comorbidities  Comorbidity 1;Comorbidity 2    Comorbidities  Scoliosis, hiatial hernia, osteopenia (T-score -2.5), allergy to tape    Rehab Potential  Fair    PT Frequency  1x / week    PT Duration  12 weeks    PT Treatment/Interventions  ADLs/Self Care Home Management;Functional mobility training;Therapeutic activities;Therapeutic exercise;Passive range of motion;Manual techniques;Cryotherapy;Electrical Stimulation;Ultrasound    PT Next Visit Plan  Comprehensive ther ex program to include  stretching, postural exercises, PRE exercises, core exercise program, neuro re-edu activities.  PT Home Exercise Plan  see patient education section    Consulted and Agree with Plan of Care  Patient       Patient will benefit from skilled therapeutic intervention in order to improve the following deficits and impairments:  Abnormal gait, Pain, Postural dysfunction, Decreased activity tolerance, Decreased range of motion, Decreased strength  Visit Diagnosis: Abnormal posture  Muscle weakness (generalized)     Problem List Patient Active Problem List   Diagnosis Date Noted  . Arthritis 06/13/2019  . GERD (gastroesophageal reflux disease) 06/10/2019  . Esophageal stricture 06/10/2019  . Educated about COVID-19 virus infection 06/04/2019  . SVT (supraventricular tachycardia) ([place]) 08/28/2018  . Elevated troponin 08/28/2018  . Light headedness 05/09/2018  . Carpal tunnel syndrome of right wrist 05/20/2015  . Primary localized osteoarthrosis, hand 05/20/2015  . Carpal tunnel syndrome on left 03/18/2015  . Osteopenia of the elderly 02/19/2014  . DDD (degenerative disc disease), lumbar 12/03/2013  . Scoliosis 12/03/2013  . Chronic LBP 05/14/2013  . IBS (irritable bowel syndrome) 11/08/2012  . UC (ulcerative colitis) ([place]) 04/23/2012  . Palpitation 10/26/2011  . Hypertension 10/26/2011  . Chronic ulcerative colitis ([place]) 03/23/2011    Ezrah Panning, [place] MPT 08/13/2019, 12:11 PM  [facility] [address] [place], [place], [address] Phone: [phone]   Fax:  [phone]  Name: Lori Soto MRN: [id number] Date of Birth: 1935-11-06

## 2019-08-27 ENCOUNTER — Ambulatory Visit: Payer: Medicare Other | Admitting: Physical Therapy

## 2019-08-27 NOTE — Addendum Note (Signed)
Addended by: Turner Baillie, [place] W on: 08/27/2019 10:29 AM   Modules accepted: Orders

## 2019-08-30 ENCOUNTER — Other Ambulatory Visit: Payer: Self-pay | Admitting: Cardiology

## 2019-09-02 NOTE — Addendum Note (Signed)
Addended by: Kristain Filo, [place] W on: 09/02/2019 03:43 PM   Modules accepted: Orders

## 2019-09-05 ENCOUNTER — Ambulatory Visit: Payer: Medicare Other | Attending: Family Medicine | Admitting: Physical Therapy

## 2019-09-05 ENCOUNTER — Encounter: Payer: Self-pay | Admitting: Physical Therapy

## 2019-09-05 ENCOUNTER — Other Ambulatory Visit: Payer: Self-pay

## 2019-09-05 DIAGNOSIS — R293 Abnormal posture: Secondary | ICD-10-CM | POA: Insufficient documentation

## 2019-09-05 DIAGNOSIS — M545 Low back pain: Secondary | ICD-10-CM | POA: Diagnosis present

## 2019-09-05 DIAGNOSIS — G8929 Other chronic pain: Secondary | ICD-10-CM | POA: Diagnosis present

## 2019-09-05 DIAGNOSIS — M546 Pain in thoracic spine: Secondary | ICD-10-CM | POA: Insufficient documentation

## 2019-09-05 DIAGNOSIS — M6281 Muscle weakness (generalized): Secondary | ICD-10-CM | POA: Insufficient documentation

## 2019-09-05 NOTE — Therapy (Signed)
[facility] [place], [place], [address] Phone: [phone]   Fax:  [phone]  Physical Therapy Treatment  Patient Details  Name: Lori Soto MRN: [id number] Date of Birth: 05-17-1935 Referring Provider (PT): Ronnie Doss DO.   Encounter Date: 09/05/2019  PT End of Session - 09/05/19 1545    Visit Number  13    Number of Visits  16    Date for PT Re-Evaluation  09/30/19    PT Start Time  0312    PT Stop Time  0356    PT Time Calculation (min)  44 min    Activity Tolerance  Patient tolerated treatment well;Patient limited by pain    Behavior During Therapy  [facility] for tasks assessed/performed       Past Medical History:  Diagnosis Date  . Anemia   . Arthritis   . Cancer ([place])    skin cancer on nose  . Carpal tunnel syndrome, bilateral   . Cataract   . Esophagitis   . GERD (gastroesophageal reflux disease)   . H/O hiatal hernia   . Osteopenia   . Scoliosis   . Shingles   . SVT (supraventricular tachycardia) ([place])   . Ulcerative colitis     Past Surgical History:  Procedure Laterality Date  . ABDOMINAL HYSTERECTOMY    . BUNIONECTOMY     Bilateral  . CARPAL TUNNEL RELEASE Left 03/10/2015   Procedure: LEFT CARPAL TUNNEL RELEASE;  Surgeon: Daryll Brod, MD;  Location: [facility];  Service: Orthopedics;  Laterality: Left;  . CARPAL TUNNEL RELEASE Right 10/31/2017   Procedure: RIGHT CARPAL TUNNEL RELEASE;  Surgeon: Daryll Brod, MD;  Location: [facility];  Service: Orthopedics;  Laterality: Right;  . CATARACT EXTRACTION W/PHACO  05/28/2012   Procedure: CATARACT EXTRACTION PHACO AND INTRAOCULAR LENS PLACEMENT (IOC);  Surgeon: Tonny Branch, MD;  [facility];  Service: Ophthalmology;  Laterality: Right;  CDE=12.84  . CATARACT EXTRACTION W/PHACO  06/07/2012   Procedure: CATARACT EXTRACTION PHACO AND INTRAOCULAR LENS PLACEMENT (IOC);  Surgeon: Tonny Branch, MD;  [facility];  Service:  Ophthalmology;  Laterality: Left;  CDE 17.60  . COLONOSCOPY    . COLONOSCOPY N/A 07/08/2015   Procedure: COLONOSCOPY;  Surgeon: Rogene Houston, MD;  [facility];  Service: Endoscopy;  Laterality: N/A;  930  . ESOPHAGEAL DILATION N/A 07/08/2015   Procedure: ESOPHAGEAL DILATION;  Surgeon: Rogene Houston, MD;  [facility];  Service: Endoscopy;  Laterality: N/A;  . ESOPHAGOGASTRODUODENOSCOPY N/A 07/08/2015   Procedure: ESOPHAGOGASTRODUODENOSCOPY (EGD);  Surgeon: Rogene Houston, MD;  [facility];  Service: Endoscopy;  Laterality: N/A;  . Hernia     Right inguinal  . HERNIA REPAIR  1961   right inguinal hernia  . UPPER GASTROINTESTINAL ENDOSCOPY      There were no vitals filed for this visit.  Subjective Assessment - 09/05/19 1523    Subjective  COVID-19 screen performed prior to patient entering clinic.  Increased pain upon arrival per patient for unknown reason    Pertinent History  Scoliosis, hiatial hernia, osteopenia, allergy to tape    Limitations  Sitting;House hold activities;Standing;Walking    How long can you stand comfortably?  20 minutes    How long can you walk comfortably?  20-30 minutes max    Diagnostic tests  x-ray    Patient Stated Goals  improve ability to perform home activities with less pain    Currently in  Pain?  Yes    Pain Score  9     Pain Location  Back    Pain Orientation  Lower    Pain Descriptors / Indicators  Discomfort    Pain Type  Chronic pain    Pain Onset  More than a month ago    Pain Frequency  Constant    Aggravating Factors   increased activity    Pain Relieving Factors  rest                       [facility] Adult PT Treatment/Exercise - 09/05/19 0001      Lumbar Exercises: Aerobic   Nustep  37mn L4 UE/LE      Lumbar Exercises: Seated   Other Seated Lumbar Exercises  core activation with red tband UE flexion       Lumbar Exercises: Supine   Ab Set  10 reps;3 seconds    Glut Set  10 reps;3  seconds    Clam  20 reps;3 seconds   red t-band with core activation   Bridge  3 seconds;5 reps    Straight Leg Raise  3 seconds   2x10 with core activation     Moist Heat Therapy   Number Minutes Moist Heat  10 Minutes    Moist Heat Location  Lumbar Spine      Electrical Stimulation   Electrical Stimulation Location  Right low back.    Electrical Stimulation Action  premod    Electrical Stimulation Parameters  80-150hz  x156m    ElChild psychotherapistTone;Pain             PT Education - 09/05/19 1545    Education Details  HEP core progression and posture awareness techniques    Person(s) Educated  Patient    Methods  Explanation;Demonstration;Handout    Comprehension  Verbalized understanding;Returned demonstration          PT Long Term Goals - 09/05/19 1532      PT LONG TERM GOAL #1   Title  Patient will be independent with an advanced HEP    Baseline  met 09/05/19    Time  6    Period  Weeks    Status  Achieved   issued today     PT LONG TERM GOAL #2   Title  Patient will report ability to perfrom ADLs with low back pain less than or equal to 4/10    Time  12    Period  Weeks    Status  On-going   up to 9-10/10 09/05/19     PT LONG TERM GOAL #3   Title  Patient will demonstrate 4/5 or greater bilateral LE MMT to improve stability during functional tasks.    Time  6    Period  Weeks    Status  On-going      PT LONG TERM GOAL #4   Title  Patient will report ability to stand for 20 minutes or greater with low back pain less than or equal to 5/10 to improve ability to perform home tasks.     Time  6    Period  Weeks    Status  On-going            Plan - 09/05/19 1547    Clinical Impression Statement  Patient arrived with increased pain. Today discussed with patient and PT to progress patient with exercises and home progression. Educated patient on posture awareness techniques and  core progression with HE provided. Patient able to tlerate all  exericses today. Patient met LTG 31 with others ongoing.    Personal Factors and Comorbidities  Comorbidity 1;Comorbidity 2    Comorbidities  Scoliosis, hiatial hernia, osteopenia (T-score -2.5), allergy to tape    Stability/Clinical Decision Making  Evolving/Moderate complexity    Rehab Potential  Fair    PT Frequency  1x / week    PT Duration  12 weeks    PT Treatment/Interventions  ADLs/Self Care Home Management;Functional mobility training;Therapeutic activities;Therapeutic exercise;Passive range of motion;Manual techniques;Cryotherapy;Electrical Stimulation;Ultrasound    PT Next Visit Plan  Comprehensive ther ex program to include stretching, postural exercises, PRE exercises, core exercise program, neuro re-edu activities.    Consulted and Agree with Plan of Care  Patient       Patient will benefit from skilled therapeutic intervention in order to improve the following deficits and impairments:  Abnormal gait, Pain, Postural dysfunction, Decreased activity tolerance, Decreased range of motion, Decreased strength  Visit Diagnosis: Abnormal posture  Muscle weakness (generalized)     Problem List Patient Active Problem List   Diagnosis Date Noted  . Arthritis 06/13/2019  . GERD (gastroesophageal reflux disease) 06/10/2019  . Esophageal stricture 06/10/2019  . Educated about COVID-19 virus infection 06/04/2019  . SVT (supraventricular tachycardia) ([place]) 08/28/2018  . Elevated troponin 08/28/2018  . Light headedness 05/09/2018  . Carpal tunnel syndrome of right wrist 05/20/2015  . Primary localized osteoarthrosis, hand 05/20/2015  . Carpal tunnel syndrome on left 03/18/2015  . Osteopenia of the elderly 02/19/2014  . DDD (degenerative disc disease), lumbar 12/03/2013  . Scoliosis 12/03/2013  . Chronic LBP 05/14/2013  . IBS (irritable bowel syndrome) 11/08/2012  . UC (ulcerative colitis) ([place]) 04/23/2012  . Palpitation 10/26/2011  . Hypertension 10/26/2011  . Chronic  ulcerative colitis ([place]) 03/23/2011    Liliani Bobo P, PTA 09/05/2019, 3:58 PM  [facility] [place], [place], [address] Phone: [phone]   Fax:  [phone]  Name: Alyssamarie Mounsey MRN: [id number] Date of Birth: 1936-03-23

## 2019-09-05 NOTE — Patient Instructions (Signed)
Pelvic Tilt: Posterior - Legs Bent (Supine)  Tighten stomach and buttock and flatten back by rolling pelvis down. Hold _10___ seconds. Relax. Repeat _10-30___ times per set. Do __2__ sets per session. Do _2___ sessions per day.   FLEXION: Sitting (Active)    Sit, both feet flat. Lift right knee toward ceiling. Tighten abdomin and buttock (draw in) Complete _10__ sets of _1-2__ repetitions. Perform _1-2__ sessions per day.   Straight Leg Raise  Tighten stomach and slowly raise locked right leg __4__ inches from floor. Repeat __10-30__ times per set. Do __2__ sets per session. Do __2__ sessions per day.  Bridging  Slowly raise buttocks from floor, keeping stomach tight. Repeat _10___ times per set. Do __2__ sets per session. Do __2__ sessions per day.   Brushing Teeth    Place one foot on ledge and one hand on counter. Bend other knee slightly to keep back straight.  Copyright  VHI. All rights reserved.  Refrigerator   Squat with knees apart to reach lower shelves and drawers.   Copyright  VHI. All rights reserved.  Laundry [employer] down and hold basket close to stand. Use leg muscles to do the work.   Copyright  VHI. All rights reserved.  Housework - Vacuuming   Hold the vacuum with arm held at side. Step back and forth to move it, keeping head up. Avoid twisting.   Copyright  VHI. All rights reserved.  Housework - Wiping   Position yourself as close as possible to reach work surface. Avoid straining your back.   Copyright  VHI. All rights reserved.  Gardening - Mowing   Keep arms close to sides and walk with lawn mower.   Copyright  VHI. All rights reserved.  Sleeping on Side   Place pillow between knees. Use cervical support under neck and a roll around waist as needed.   Copyright  VHI. All rights reserved.  Log Roll   Lying on back, bend left knee and place left arm across chest. Roll all in one movement to the right.  Reverse to roll to the left. Always move as one unit.   Copyright  VHI. All rights reserved.  Stand to Sit / Sit to Stand   To sit: Bend knees to lower self onto front edge of chair, then scoot back on seat. To stand: Reverse sequence by placing one foot forward, and scoot to front of seat. Use rocking motion to stand up.  Copyright  VHI. All rights reserved.  Posture - Standing   Good posture is important. Avoid slouching and forward head thrust. Maintain curve in low back and align ears over shoul- ders, hips over ankles.   Copyright  VHI. All rights reserved.  Posture - Sitting   Sit upright, head facing forward. Try using a roll to support lower back. Keep shoulders relaxed, and avoid rounded back. Keep hips level with knees. Avoid crossing legs for long periods.   Copyright  VHI. All rights reserved.  Computer Work   Position work to Programmer, multimedia. Use proper work and seat height. Keep shoulders back and down, wrists straight, and elbows at right angles. Use chair that provides full back support. Add footrest and lumbar roll as needed.   Copyright  VHI. All rights reserved.

## 2019-09-10 ENCOUNTER — Ambulatory Visit: Payer: Medicare Other | Admitting: Physical Therapy

## 2019-09-10 ENCOUNTER — Encounter: Payer: Self-pay | Admitting: Physical Therapy

## 2019-09-10 ENCOUNTER — Other Ambulatory Visit: Payer: Self-pay

## 2019-09-10 DIAGNOSIS — R293 Abnormal posture: Secondary | ICD-10-CM | POA: Diagnosis not present

## 2019-09-10 DIAGNOSIS — M6281 Muscle weakness (generalized): Secondary | ICD-10-CM

## 2019-09-10 NOTE — Therapy (Signed)
[facility] [place], [place], [address] Phone: [phone]   Fax:  [phone]  Physical Therapy Treatment  Patient Details  Name: Lori Soto MRN: [id number] Date of Birth: 1935/07/29 Referring Provider (PT): Ronnie Doss DO.   Encounter Date: 09/10/2019  PT End of Session - 09/10/19 1315    Visit Number  14    Number of Visits  16    Date for PT Re-Evaluation  09/30/19    PT Start Time  3009    PT Stop Time  1346    PT Time Calculation (min)  41 min    Activity Tolerance  Patient tolerated treatment well    Behavior During Therapy  [facility] for tasks assessed/performed       Past Medical History:  Diagnosis Date  . Anemia   . Arthritis   . Cancer ([place])    skin cancer on nose  . Carpal tunnel syndrome, bilateral   . Cataract   . Esophagitis   . GERD (gastroesophageal reflux disease)   . H/O hiatal hernia   . Osteopenia   . Scoliosis   . Shingles   . SVT (supraventricular tachycardia) ([place])   . Ulcerative colitis     Past Surgical History:  Procedure Laterality Date  . ABDOMINAL HYSTERECTOMY    . BUNIONECTOMY     Bilateral  . CARPAL TUNNEL RELEASE Left 03/10/2015   Procedure: LEFT CARPAL TUNNEL RELEASE;  Surgeon: Daryll Brod, MD;  Location: [facility];  Service: Orthopedics;  Laterality: Left;  . CARPAL TUNNEL RELEASE Right 10/31/2017   Procedure: RIGHT CARPAL TUNNEL RELEASE;  Surgeon: Daryll Brod, MD;  Location: [facility];  Service: Orthopedics;  Laterality: Right;  . CATARACT EXTRACTION W/PHACO  05/28/2012   Procedure: CATARACT EXTRACTION PHACO AND INTRAOCULAR LENS PLACEMENT (IOC);  Surgeon: Tonny Branch, MD;  [facility];  Service: Ophthalmology;  Laterality: Right;  CDE=12.84  . CATARACT EXTRACTION W/PHACO  06/07/2012   Procedure: CATARACT EXTRACTION PHACO AND INTRAOCULAR LENS PLACEMENT (IOC);  Surgeon: Tonny Branch, MD;  [facility];  Service: Ophthalmology;   Laterality: Left;  CDE 17.60  . COLONOSCOPY    . COLONOSCOPY N/A 07/08/2015   Procedure: COLONOSCOPY;  Surgeon: Rogene Houston, MD;  [facility];  Service: Endoscopy;  Laterality: N/A;  930  . ESOPHAGEAL DILATION N/A 07/08/2015   Procedure: ESOPHAGEAL DILATION;  Surgeon: Rogene Houston, MD;  [facility];  Service: Endoscopy;  Laterality: N/A;  . ESOPHAGOGASTRODUODENOSCOPY N/A 07/08/2015   Procedure: ESOPHAGOGASTRODUODENOSCOPY (EGD);  Surgeon: Rogene Houston, MD;  [facility];  Service: Endoscopy;  Laterality: N/A;  . Hernia     Right inguinal  . HERNIA REPAIR  1961   right inguinal hernia  . UPPER GASTROINTESTINAL ENDOSCOPY      There were no vitals filed for this visit.  Subjective Assessment - 09/10/19 1309    Subjective  COVID-19 screen performed prior to patient entering clinic. Reports her back is doing fair but had LBP the last few days and knee pain.    Pertinent History  Scoliosis, hiatial hernia, osteopenia, allergy to tape    Limitations  Sitting;House hold activities;Standing;Walking    How long can you stand comfortably?  20 minutes    How long can you walk comfortably?  20-30 minutes max    Diagnostic tests  x-ray    Patient Stated Goals  improve ability to perform home activities with less pain  Currently in Pain?  Yes    Pain Score  5     Pain Location  Back    Pain Orientation  Lower    Pain Descriptors / Indicators  Discomfort    Pain Type  Chronic pain    Pain Onset  More than a month ago    Pain Frequency  Constant         [facility] PT Assessment - 09/10/19 0001      Assessment   Medical Diagnosis  DDD, lumbar.    Referring Provider (PT)  Ronnie Doss DO.                   [place] Adult PT Treatment/Exercise - 09/10/19 0001      Lumbar Exercises: Aerobic   Nustep  L1 x16 min      Lumbar Exercises: Machines for Strengthening   Cybex Lumbar Extension  70# 1x10 reps    Other Lumbar Machine Exercise  Lat  pulldown 30# x20 reps      Lumbar Exercises: Supine   Other Supine Lumbar Exercises  X to V x20 reps for chest stretch      Modalities   Modalities  Electrical Stimulation;Moist Heat      Moist Heat Therapy   Number Minutes Moist Heat  10 Minutes    Moist Heat Location  Lumbar Spine      Electrical Stimulation   Electrical Stimulation Location  B low back    Electrical Stimulation Action  IFC    Electrical Stimulation Parameters  80-150 hz x10 min    Electrical Stimulation Goals  Pain                  PT Long Term Goals - 09/05/19 1532      PT LONG TERM GOAL #1   Title  Patient will be independent with an advanced HEP    Baseline  met 09/05/19    Time  6    Period  Weeks    Status  Achieved   issued today     PT LONG TERM GOAL #2   Title  Patient will report ability to perfrom ADLs with low back pain less than or equal to 4/10    Time  12    Period  Weeks    Status  On-going   up to 9-10/10 09/05/19     PT LONG TERM GOAL #3   Title  Patient will demonstrate 4/5 or greater bilateral LE MMT to improve stability during functional tasks.    Time  6    Period  Weeks    Status  On-going      PT LONG TERM GOAL #4   Title  Patient will report ability to stand for 20 minutes or greater with low back pain less than or equal to 5/10 to improve ability to perform home tasks.     Time  6    Period  Weeks    Status  On-going            Plan - 09/10/19 1448    Clinical Impression Statement  Patient presented in clinic with reports of moderate LBP today but has had increased pain within the last few days. Patient limited with therex per report of knee pain. Supine X to V initated as to provide chest stretch for posture. Normal modalities response noted following removal of the modalities.    Personal Factors and Comorbidities  Comorbidity 1;Comorbidity 2    Comorbidities  Scoliosis, hiatial hernia, osteopenia (T-score -2.5), allergy to tape    Stability/Clinical  Decision Making  Evolving/Moderate complexity    Rehab Potential  Fair    PT Frequency  1x / week    PT Duration  12 weeks    PT Treatment/Interventions  ADLs/Self Care Home Management;Functional mobility training;Therapeutic activities;Therapeutic exercise;Passive range of motion;Manual techniques;Cryotherapy;Electrical Stimulation;Ultrasound    PT Next Visit Plan  Comprehensive ther ex program to include stretching, postural exercises, PRE exercises, core exercise program, neuro re-edu activities.    PT Home Exercise Plan  see patient education section    Consulted and Agree with Plan of Care  Patient       Patient will benefit from skilled therapeutic intervention in order to improve the following deficits and impairments:  Abnormal gait, Pain, Postural dysfunction, Decreased activity tolerance, Decreased range of motion, Decreased strength  Visit Diagnosis: Abnormal posture  Muscle weakness (generalized)     Problem List Patient Active Problem List   Diagnosis Date Noted  . Arthritis 06/13/2019  . GERD (gastroesophageal reflux disease) 06/10/2019  . Esophageal stricture 06/10/2019  . Educated about COVID-19 virus infection 06/04/2019  . SVT (supraventricular tachycardia) ([place]) 08/28/2018  . Elevated troponin 08/28/2018  . Light headedness 05/09/2018  . Carpal tunnel syndrome of right wrist 05/20/2015  . Primary localized osteoarthrosis, hand 05/20/2015  . Carpal tunnel syndrome on left 03/18/2015  . Osteopenia of the elderly 02/19/2014  . DDD (degenerative disc disease), lumbar 12/03/2013  . Scoliosis 12/03/2013  . Chronic LBP 05/14/2013  . IBS (irritable bowel syndrome) 11/08/2012  . UC (ulcerative colitis) ([place]) 04/23/2012  . Palpitation 10/26/2011  . Hypertension 10/26/2011  . Chronic ulcerative colitis ([place]) 03/23/2011    Standley Brooking, PTA 09/10/2019, 2:53 PM  [facility] [address] [place], [place],  [address] Phone: [phone]   Fax:  [phone]  Name: Mima Cranmore MRN: [id number] Date of Birth: [birth year]

## 2019-09-17 ENCOUNTER — Ambulatory Visit: Payer: Medicare Other | Admitting: *Deleted

## 2019-09-17 ENCOUNTER — Other Ambulatory Visit: Payer: Self-pay

## 2019-09-17 DIAGNOSIS — M546 Pain in thoracic spine: Secondary | ICD-10-CM

## 2019-09-17 DIAGNOSIS — G8929 Other chronic pain: Secondary | ICD-10-CM

## 2019-09-17 DIAGNOSIS — R293 Abnormal posture: Secondary | ICD-10-CM | POA: Diagnosis not present

## 2019-09-17 DIAGNOSIS — M6281 Muscle weakness (generalized): Secondary | ICD-10-CM

## 2019-09-17 NOTE — Therapy (Signed)
[facility] [place], [place], [address] Phone: [phone]   Fax:  [phone]  Physical Therapy Treatment  Patient Details  Name: Lori Soto MRN: [id number] Date of Birth: Dec 30, 1935 Referring Provider (PT): Ronnie Doss DO.   Encounter Date: 09/17/2019  PT End of Session - 09/17/19 1122    Visit Number  15    Number of Visits  16    Date for PT Re-Evaluation  09/30/19    PT Start Time  1115    PT Stop Time  1202    PT Time Calculation (min)  47 min       Past Medical History:  Diagnosis Date  . Anemia   . Arthritis   . Cancer ([place])    skin cancer on nose  . Carpal tunnel syndrome, bilateral   . Cataract   . Esophagitis   . GERD (gastroesophageal reflux disease)   . H/O hiatal hernia   . Osteopenia   . Scoliosis   . Shingles   . SVT (supraventricular tachycardia) ([place])   . Ulcerative colitis     Past Surgical History:  Procedure Laterality Date  . ABDOMINAL HYSTERECTOMY    . BUNIONECTOMY     Bilateral  . CARPAL TUNNEL RELEASE Left 03/10/2015   Procedure: LEFT CARPAL TUNNEL RELEASE;  Surgeon: Daryll Brod, MD;  Location: [facility];  Service: Orthopedics;  Laterality: Left;  . CARPAL TUNNEL RELEASE Right 10/31/2017   Procedure: RIGHT CARPAL TUNNEL RELEASE;  Surgeon: Daryll Brod, MD;  Location: [facility];  Service: Orthopedics;  Laterality: Right;  . CATARACT EXTRACTION W/PHACO  05/28/2012   Procedure: CATARACT EXTRACTION PHACO AND INTRAOCULAR LENS PLACEMENT (IOC);  Surgeon: Tonny Branch, MD;  [facility];  Service: Ophthalmology;  Laterality: Right;  CDE=12.84  . CATARACT EXTRACTION W/PHACO  06/07/2012   Procedure: CATARACT EXTRACTION PHACO AND INTRAOCULAR LENS PLACEMENT (IOC);  Surgeon: Tonny Branch, MD;  [facility];  Service: Ophthalmology;  Laterality: Left;  CDE 17.60  . COLONOSCOPY    . COLONOSCOPY N/A 07/08/2015   Procedure: COLONOSCOPY;  Surgeon: Rogene Houston, MD;  [facility];  Service: Endoscopy;  Laterality: N/A;  930  . ESOPHAGEAL DILATION N/A 07/08/2015   Procedure: ESOPHAGEAL DILATION;  Surgeon: Rogene Houston, MD;  [facility];  Service: Endoscopy;  Laterality: N/A;  . ESOPHAGOGASTRODUODENOSCOPY N/A 07/08/2015   Procedure: ESOPHAGOGASTRODUODENOSCOPY (EGD);  Surgeon: Rogene Houston, MD;  [facility];  Service: Endoscopy;  Laterality: N/A;  . Hernia     Right inguinal  . HERNIA REPAIR  1961   right inguinal hernia  . UPPER GASTROINTESTINAL ENDOSCOPY      There were no vitals filed for this visit.                     [place] Adult PT Treatment/Exercise - 09/17/19 0001      Lumbar Exercises: Aerobic   Nustep  L1 x16 min      Lumbar Exercises: Machines for Strengthening   Cybex Lumbar Extension  70# 2x10 reps    Other Lumbar Machine Exercise  Lat pulldown 30# x20 reps      Lumbar Exercises: Supine   Other Supine Lumbar Exercises  --   reviewed for HEP     Modalities   Modalities  Electrical Stimulation;Moist Heat      Moist Heat Therapy   Number Minutes Moist Heat  15 Minutes  Moist Heat Location  Lumbar Spine      Electrical Stimulation   Electrical Stimulation Location  B low back    Electrical Stimulation Action  IFC    Electrical Stimulation Parameters  80-150 hz x 15 mins    Electrical Stimulation Goals  Pain                  PT Long Term Goals - 09/05/19 1532      PT LONG TERM GOAL #1   Title  Patient will be independent with an advanced HEP    Baseline  met 09/05/19    Time  6    Period  Weeks    Status  Achieved   issued today     PT LONG TERM GOAL #2   Title  Patient will report ability to perfrom ADLs with low back pain less than or equal to 4/10    Time  12    Period  Weeks    Status  On-going   up to 9-10/10 09/05/19     PT LONG TERM GOAL #3   Title  Patient will demonstrate 4/5 or greater bilateral LE MMT to improve stability during  functional tasks.    Time  6    Period  Weeks    Status  On-going      PT LONG TERM GOAL #4   Title  Patient will report ability to stand for 20 minutes or greater with low back pain less than or equal to 5/10 to improve ability to perform home tasks.     Time  6    Period  Weeks    Status  On-going            Plan - 09/17/19 1155    Clinical Impression Statement  Pt arrived today doing ok and reports trying to perform HEP stretches more often during the day to get relief. Today she was able to perform core/back strengthening exs and did well limited mainly by knee pain. Normal modality response today. DC after next Rx.    Personal Factors and Comorbidities  Comorbidity 1;Comorbidity 2    Comorbidities  Scoliosis, hiatial hernia, osteopenia (T-score -2.5), allergy to tape    Stability/Clinical Decision Making  Evolving/Moderate complexity    PT Frequency  1x / week    PT Duration  12 weeks    PT Treatment/Interventions  ADLs/Self Care Home Management;Functional mobility training;Therapeutic activities;Therapeutic exercise;Passive range of motion;Manual techniques;Cryotherapy;Electrical Stimulation;Ultrasound    PT Next Visit Plan  DC after next visit    PT Home Exercise Plan  see patient education section       Patient will benefit from skilled therapeutic intervention in order to improve the following deficits and impairments:  Abnormal gait, Pain, Postural dysfunction, Decreased activity tolerance, Decreased range of motion, Decreased strength  Visit Diagnosis: Muscle weakness (generalized)  Abnormal posture  Pain in thoracic spine  Chronic bilateral low back pain without sciatica  Chronic right-sided low back pain without sciatica     Problem List Patient Active Problem List   Diagnosis Date Noted  . Arthritis 06/13/2019  . GERD (gastroesophageal reflux disease) 06/10/2019  . Esophageal stricture 06/10/2019  . Educated about COVID-19 virus infection 06/04/2019   . SVT (supraventricular tachycardia) ([place]) 08/28/2018  . Elevated troponin 08/28/2018  . Light headedness 05/09/2018  . Carpal tunnel syndrome of right wrist 05/20/2015  . Primary localized osteoarthrosis, hand 05/20/2015  . Carpal tunnel syndrome on left 03/18/2015  . Osteopenia of  the elderly 02/19/2014  . DDD (degenerative disc disease), lumbar 12/03/2013  . Scoliosis 12/03/2013  . Chronic LBP 05/14/2013  . IBS (irritable bowel syndrome) 11/08/2012  . UC (ulcerative colitis) ([place]) 04/23/2012  . Palpitation 10/26/2011  . Hypertension 10/26/2011  . Chronic ulcerative colitis ([place]) 03/23/2011    Montey Ebel,CHRIS, PTA 09/17/2019, 12:04 PM  [facility] [address] [place], [place], [address] Phone: [phone]   Fax:  [phone]  Name: Lori Soto MRN: [id number] Date of Birth: 1935/12/21

## 2019-09-24 ENCOUNTER — Other Ambulatory Visit: Payer: Self-pay

## 2019-09-24 ENCOUNTER — Ambulatory Visit: Payer: Medicare Other | Admitting: *Deleted

## 2019-09-24 DIAGNOSIS — G8929 Other chronic pain: Secondary | ICD-10-CM

## 2019-09-24 DIAGNOSIS — M6281 Muscle weakness (generalized): Secondary | ICD-10-CM

## 2019-09-24 DIAGNOSIS — R293 Abnormal posture: Secondary | ICD-10-CM | POA: Diagnosis not present

## 2019-09-24 DIAGNOSIS — M546 Pain in thoracic spine: Secondary | ICD-10-CM

## 2019-09-24 DIAGNOSIS — M545 Low back pain, unspecified: Secondary | ICD-10-CM

## 2019-09-24 NOTE — Therapy (Signed)
[facility] [place], [place], [address] Phone: [phone]   Fax:  [phone]  Physical Therapy Treatment  Patient Details  Name: Lori Soto MRN: [id number] Date of Birth: [birth year] Referring Provider (PT): Ronnie Doss DO.   Encounter Date: 09/24/2019  PT End of Session - 09/24/19 1130    Visit Number  16    Number of Visits  16    Date for PT Re-Evaluation  09/30/19    PT Start Time  1115    PT Stop Time  1205    PT Time Calculation (min)  50 min       Past Medical History:  Diagnosis Date  . Anemia   . Arthritis   . Cancer ([place])    skin cancer on nose  . Carpal tunnel syndrome, bilateral   . Cataract   . Esophagitis   . GERD (gastroesophageal reflux disease)   . H/O hiatal hernia   . Osteopenia   . Scoliosis   . Shingles   . SVT (supraventricular tachycardia) ([place])   . Ulcerative colitis     Past Surgical History:  Procedure Laterality Date  . ABDOMINAL HYSTERECTOMY    . BUNIONECTOMY     Bilateral  . CARPAL TUNNEL RELEASE Left 03/10/2015   Procedure: LEFT CARPAL TUNNEL RELEASE;  Surgeon: Daryll Brod, MD;  Location: [facility];  Service: Orthopedics;  Laterality: Left;  . CARPAL TUNNEL RELEASE Right 10/31/2017   Procedure: RIGHT CARPAL TUNNEL RELEASE;  Surgeon: Daryll Brod, MD;  Location: [facility];  Service: Orthopedics;  Laterality: Right;  . CATARACT EXTRACTION W/PHACO  05/28/2012   Procedure: CATARACT EXTRACTION PHACO AND INTRAOCULAR LENS PLACEMENT (IOC);  Surgeon: Tonny Branch, MD;  Location: [facility];  Service: Ophthalmology;  Laterality: Right;  CDE=12.84  . CATARACT EXTRACTION W/PHACO  06/07/2012   Procedure: CATARACT EXTRACTION PHACO AND INTRAOCULAR LENS PLACEMENT (IOC);  Surgeon: Tonny Branch, MD;  Location: [facility];  Service: Ophthalmology;  Laterality: Left;  CDE 17.60  . COLONOSCOPY    . COLONOSCOPY N/A 07/08/2015   Procedure: COLONOSCOPY;  Surgeon: Rogene Houston, MD;  [facility];  Service: Endoscopy;  Laterality: N/A;  930  . ESOPHAGEAL DILATION N/A 07/08/2015   Procedure: ESOPHAGEAL DILATION;  Surgeon: Rogene Houston, MD;  [facility];  Service: Endoscopy;  Laterality: N/A;  . ESOPHAGOGASTRODUODENOSCOPY N/A 07/08/2015   Procedure: ESOPHAGOGASTRODUODENOSCOPY (EGD);  Surgeon: Rogene Houston, MD;  [facility];  Service: Endoscopy;  Laterality: N/A;  . Hernia     Right inguinal  . HERNIA REPAIR  1961   right inguinal hernia  . UPPER GASTROINTESTINAL ENDOSCOPY      There were no vitals filed for this visit.  Subjective Assessment - 09/24/19 1122    Subjective  COVID-19 screen performed prior to patient entering clinic.  Last day today, but I wish they would let me keep coming.    Pertinent History  Scoliosis, hiatial hernia, osteopenia, allergy to tape    Limitations  Sitting;House hold activities;Standing;Walking    How long can you stand comfortably?  20 minutes    How long can you walk comfortably?  20-30 minutes max    Diagnostic tests  x-ray    Patient Stated Goals  improve ability to perform home activities with less pain    Currently in Pain?  Yes    Pain Score  4     Pain Location  Back  Pain Orientation  Lower    Pain Descriptors / Indicators  Discomfort    Pain Type  Chronic pain    Pain Onset  More than a month ago                        [facility] Adult PT Treatment/Exercise - 09/24/19 0001      Lumbar Exercises: Aerobic   Nustep  L1 x16 min      Lumbar Exercises: Machines for Strengthening   Cybex Lumbar Extension  60# 2x10 reps    Other Lumbar Machine Exercise  Lat pulldown 30# x20 reps      Modalities   Modalities  Electrical Stimulation;Moist Heat      Moist Heat Therapy   Number Minutes Moist Heat  15 Minutes    Moist Heat Location  Lumbar Spine      Electrical Stimulation   Electrical Stimulation Location  B low back    Electrical Stimulation Action  IFC     Electrical Stimulation Parameters  80-_0  x 15 mins               PT Short Term Goals - 09/24/19 1130      PT SHORT TERM GOAL #1   Title  Ind with a HEP.    Time  2    Period  Weeks    Status  Achieved        PT Long Term Goals - 09/24/19 1130      PT LONG TERM GOAL #1   Title  Patient will be independent with an advanced HEP    Baseline  met 09/05/19    Time  6    Period  Weeks    Status  Achieved      PT LONG TERM GOAL #2   Title  Patient will report ability to perfrom ADLs with low back pain less than or equal to 4/10    Time  12    Period  Weeks    Status  Not Met   LBP 8-9/10 some days     PT LONG TERM GOAL #3   Title  Patient will demonstrate 4/5 or greater bilateral LE MMT to improve stability during functional tasks.    Time  6    Period  Weeks    Status  Achieved      PT LONG TERM GOAL #4   Title  Patient will report ability to stand for 20 minutes or greater with low back pain less than or equal to 5/10 to improve ability to perform home tasks.     Time  6    Period  Weeks    Status  Not Met   6/10           Plan - 09/24/19 1259    Clinical Impression Statement  Pt arrived today doing fairly well and was able to complete exs for posture/ core strengthening. She feels independent with her core strengthening and stretching exs with HEP. She feels that she has progressed some with PT, but continues to have pain with ADLs and was unable to meet this LTG. She feels that traction is what gives her the most relief. Normal modality response today. DC to HEP.    Personal Factors and Comorbidities  Comorbidity 1;Comorbidity 2    Comorbidities  Scoliosis, hiatial hernia, osteopenia (T-score -2.5), allergy to tape    Stability/Clinical Decision Making  Evolving/Moderate complexity    Rehab Potential  Fair  PT Frequency  1x / week    PT Duration  12 weeks    PT Treatment/Interventions  ADLs/Self Care Home Management;Functional mobility  training;Therapeutic activities;Therapeutic exercise;Passive range of motion;Manual techniques;Cryotherapy;Electrical Stimulation;Ultrasound    PT Next Visit Plan  DC to HEP       Patient will benefit from skilled therapeutic intervention in order to improve the following deficits and impairments:  Abnormal gait, Pain, Postural dysfunction, Decreased activity tolerance, Decreased range of motion, Decreased strength  Visit Diagnosis: Muscle weakness (generalized)  Abnormal posture  Pain in thoracic spine  Chronic bilateral low back pain without sciatica  Chronic right-sided low back pain without sciatica     Problem List Patient Active Problem List   Diagnosis Date Noted  . Arthritis 06/13/2019  . GERD (gastroesophageal reflux disease) 06/10/2019  . Esophageal stricture 06/10/2019  . Educated about COVID-19 virus infection 06/04/2019  . SVT (supraventricular tachycardia) ([place]) 08/28/2018  . Elevated troponin 08/28/2018  . Light headedness 05/09/2018  . Carpal tunnel syndrome of right wrist 05/20/2015  . Primary localized osteoarthrosis, hand 05/20/2015  . Carpal tunnel syndrome on left 03/18/2015  . Osteopenia of the elderly 02/19/2014  . DDD (degenerative disc disease), lumbar 12/03/2013  . Scoliosis 12/03/2013  . Chronic LBP 05/14/2013  . IBS (irritable bowel syndrome) 11/08/2012  . UC (ulcerative colitis) ([place]) 04/23/2012  . Palpitation 10/26/2011  . Hypertension 10/26/2011  . Chronic ulcerative colitis ([place]) 03/23/2011    Fanta Wimberley,CHRIS, PTA 09/24/2019, 4:45 PM  [facility] [address] [place], [place], [address] Phone: [phone]   Fax:  [phone]  Name: Roselia Snipe MRN: [id number] Date of Birth: [birth year]  PHYSICAL THERAPY DISCHARGE SUMMARY  Visits from Start of Care: 16.  Current functional level related to goals / functional outcomes: See above.   Remaining deficits: LTG's #1 and #3 met.  LTG's  #2 and #4 unmet.   Education / Equipment: HEP. Plan: Patient agrees to discharge.  Patient goals were partially met. Patient is being discharged due to meeting the stated rehab goals.  ?????         Mali Applegate MPT

## 2019-10-09 ENCOUNTER — Encounter: Payer: Self-pay | Admitting: Family Medicine

## 2019-10-09 ENCOUNTER — Other Ambulatory Visit: Payer: Self-pay | Admitting: Family Medicine

## 2019-10-09 DIAGNOSIS — I10 Essential (primary) hypertension: Secondary | ICD-10-CM

## 2019-10-09 DIAGNOSIS — E559 Vitamin D deficiency, unspecified: Secondary | ICD-10-CM

## 2019-10-09 DIAGNOSIS — E782 Mixed hyperlipidemia: Secondary | ICD-10-CM

## 2019-10-09 DIAGNOSIS — M8580 Other specified disorders of bone density and structure, unspecified site: Secondary | ICD-10-CM

## 2019-10-09 DIAGNOSIS — D649 Anemia, unspecified: Secondary | ICD-10-CM

## 2019-10-29 ENCOUNTER — Other Ambulatory Visit: Payer: Medicare Other

## 2019-10-29 ENCOUNTER — Other Ambulatory Visit: Payer: Self-pay

## 2019-10-29 DIAGNOSIS — E782 Mixed hyperlipidemia: Secondary | ICD-10-CM

## 2019-10-29 DIAGNOSIS — I10 Essential (primary) hypertension: Secondary | ICD-10-CM

## 2019-10-29 DIAGNOSIS — E559 Vitamin D deficiency, unspecified: Secondary | ICD-10-CM

## 2019-10-29 DIAGNOSIS — M8580 Other specified disorders of bone density and structure, unspecified site: Secondary | ICD-10-CM

## 2019-10-29 DIAGNOSIS — D649 Anemia, unspecified: Secondary | ICD-10-CM

## 2019-10-30 LAB — CMP14+EGFR
ALT: 15 IU/L (ref 0–32)
AST: 18 IU/L (ref 0–40)
Albumin/Globulin Ratio: 1.3 (ref 1.2–2.2)
Albumin: 3.9 g/dL (ref 3.6–4.6)
Alkaline Phosphatase: 44 IU/L — ABNORMAL LOW (ref 48–121)
BUN/Creatinine Ratio: 11 — ABNORMAL LOW (ref 12–28)
BUN: 10 mg/dL (ref 8–27)
Bilirubin Total: 0.6 mg/dL (ref 0.0–1.2)
CO2: 27 mmol/L (ref 20–29)
Calcium: 9.8 mg/dL (ref 8.7–10.3)
Chloride: 102 mmol/L (ref 96–106)
Creatinine, Ser: 0.91 mg/dL (ref 0.57–1.00)
GFR calc Af Amer: 68 mL/min/{1.73_m2} (ref >59)
GFR calc non Af Amer: 59 mL/min/{1.73_m2} — ABNORMAL LOW (ref >59)
Globulin, Total: 3 g/dL (ref 1.5–4.5)
Glucose: 91 mg/dL (ref 65–99)
Potassium: 4.9 mmol/L (ref 3.5–5.2)
Sodium: 143 mmol/L (ref 134–144)
Total Protein: 6.9 g/dL (ref 6.0–8.5)

## 2019-10-30 LAB — TSH: TSH: 1.66 u[IU]/mL (ref 0.450–4.500)

## 2019-10-30 LAB — LIPID PANEL
Chol/HDL Ratio: 3.5 ratio (ref 0.0–4.4)
Cholesterol, Total: 187 mg/dL (ref 100–199)
HDL: 54 mg/dL (ref >39)
LDL Chol Calc (NIH): 115 mg/dL — ABNORMAL HIGH (ref 0–99)
Triglycerides: 98 mg/dL (ref 0–149)
VLDL Cholesterol Cal: 18 mg/dL (ref 5–40)

## 2019-10-30 LAB — CBC
Hematocrit: 41.7 % (ref 34.0–46.6)
Hemoglobin: 13.9 g/dL (ref 11.1–15.9)
MCH: 29.7 pg (ref 26.6–33.0)
MCHC: 33.3 g/dL (ref 31.5–35.7)
MCV: 89 fL (ref 79–97)
Platelets: 233 10*3/uL (ref 150–450)
RBC: 4.68 x10E6/uL (ref 3.77–5.28)
RDW: 12.9 % (ref 11.7–15.4)
WBC: 5.3 10*3/uL (ref 3.4–10.8)

## 2019-10-30 LAB — VITAMIN D 25 HYDROXY (VIT D DEFICIENCY, FRACTURES): Vit D, 25-Hydroxy: 45.5 ng/mL (ref 30.0–100.0)

## 2019-11-01 ENCOUNTER — Ambulatory Visit (INDEPENDENT_AMBULATORY_CARE_PROVIDER_SITE_OTHER): Payer: Medicare Other | Admitting: Family Medicine

## 2019-11-01 ENCOUNTER — Encounter: Payer: Self-pay | Admitting: Family Medicine

## 2019-11-01 ENCOUNTER — Other Ambulatory Visit: Payer: Self-pay

## 2019-11-01 VITALS — BP 136/82 | HR 54 | Temp 97.3°F | Ht 62.0 in | Wt 134.0 lb

## 2019-11-01 DIAGNOSIS — E782 Mixed hyperlipidemia: Secondary | ICD-10-CM

## 2019-11-01 DIAGNOSIS — M8580 Other specified disorders of bone density and structure, unspecified site: Secondary | ICD-10-CM

## 2019-11-01 DIAGNOSIS — I10 Essential (primary) hypertension: Secondary | ICD-10-CM

## 2019-11-01 DIAGNOSIS — M5136 Other intervertebral disc degeneration, lumbar region: Secondary | ICD-10-CM

## 2019-11-01 NOTE — Progress Notes (Signed)
Subjective: CC: 66-monthcheckup, osteopenia PCP: [name] DO HIFO:YDXAJOSHadleigh Felberis a 84y.o. female presenting to clinic today for:  1. Osteopenia/DDD Continues to take calcium and vitamin D supplementation.  She had paused in this for a bit in December due to hypercalcemia noted on a lab.  However, her most recent laboratory results were within normal range.  She has not been performing as many of the weightbearing exercises that she normally does because of not being in the gym.  She has been seeing PT for her back and they were doing traction but voiced concern about ongoing contraction due to osteopenia.  She worries about this because the traction is the only thing that helps her back.  She does not want a be on any pain medications.  2.  Hypertension Patient reports compliance with the medications prescribed by Dr. HPercival Spanish  She takes metoprolol 25 mg twice daily.  Her blood pressures at home typically run in the low 100s and [id number] with diastolics typically in the 60s to 70s.  She had an outlier of 1209for systolic June 25.  Otherwise, blood pressures remain fairly consistent.  She does not report any chest pain, shortness of breath, edema today   ROS: Per HPI  Allergies  Allergen Reactions   Penicillins Itching and Swelling    Patient states that she had a rash also Has patient had a PCN reaction causing immediate rash, facial/tongue/throat swelling, SOB or lightheadedness with hypotension: No Has patient had a PCN reaction causing severe rash involving mucus membranes or skin necrosis: No Has patient had a PCN reaction that required hospitalization No Has patient had a PCN reaction occurring within the last 10 years: No If all of the above answers are "NO", then may proceed with Cephalosporin use.    Tape Itching   Influenza Virus Vacc Split Pf Rash    Per Patient she was told by her PCP not to ever take this injection again   Past Medical  History:  Diagnosis Date   Anemia    Arthritis    [place]    [place] cancer [place]   Carpal tunnel syndrome, bilateral    Cataract    Esophagitis    GERD (gastroesophageal reflux disease)    H/O hiatal hernia    Osteopenia    Scoliosis    Shingles    SVT (supraventricular tachycardia) ([facility])    Ulcerative colitis     Current Outpatient Medications:    acetaminophen (TYLENOL 8 HOUR ARTHRITIS PAIN) 650 MG CR tablet, Take 650 mg by mouth every 8 (eight) hours as needed. Patient states that she takes prn for arthritis in her knees. , Disp: , Rfl:    Bromelains 500 MG TABS, Take 500 mg by mouth daily., Disp: , Rfl:    Cayenne 500 MG CAPS, Take 1 capsule by mouth daily. , Disp: , Rfl:    Coenzyme Q10 (CO Q-10 PO), Take 1 tablet by mouth daily. With red yeast rice [employer] Brand, Disp: , Rfl:    COLLAGEN PO, Take 1 tablet by mouth daily. This is known as 1-2-3, Disp: , Rfl:    Cyanocobalamin (VITAMIN B 12 PO), Take 1,000 mcg by mouth daily. , Disp: , Rfl:    Ginkgo Biloba 40 MG TABS, Take 40 mg by mouth daily. , Disp: , Rfl:    glucosamine-chondroitin 500-400 MG tablet, Take 1 tablet by mouth 2 (two) times daily.  , Disp: , Rfl:  Hyaluronic Acid-Vitamin C (HYALURONIC ACID PO), Take 80 mg by mouth daily., Disp: , Rfl:    Mesalamine (ASACOL) 400 MG CPDR DR capsule, TAKE 2 CAPSULES DAILY, Disp: 180 capsule, Rfl: 3   metoprolol tartrate (LOPRESSOR) 25 MG tablet, Take 1 tablet (25 mg total) by mouth 2 (two) times daily., Disp: 180 tablet, Rfl: 3   MULTIPLE VITAMINS PO, Take 1 tablet by mouth daily. In the mornings, Disp: , Rfl:    Nutritional Supplements (GRAPESEED EXTRACT PO), Take 60 mg by mouth daily. , Disp: , Rfl:    OAT BRAN SOLUBLE PO, Take 1 tablet by mouth daily., Disp: , Rfl:    OVER THE COUNTER MEDICATION, Take 66 mg by mouth daily. Vision Gold Lutein, Disp: , Rfl:    OVER THE COUNTER MEDICATION, Take 250 mg by mouth daily. Alphalipoic Acid 250  mg daily, Disp: , Rfl:    pantoprazole (PROTONIX) 40 MG tablet, TAKE 1 TABLET DAILY BEFORE BREAKFAST, Disp: 90 tablet, Rfl: 3   pyridOXINE (VITAMIN B-6) 100 MG tablet, Take 100 mg by mouth daily. , Disp: , Rfl:    Turmeric 500 MG CAPS, Take 1 capsule by mouth 3 (three) times daily. , Disp: , Rfl:    Vitamins C E (CRANBERRY CONCENTRATE PO), Take 2,500 mg by mouth daily. , Disp: , Rfl:    Zinc 50 MG CAPS, Take 50 mg by mouth daily. , Disp: , Rfl:  Social History   Socioeconomic History   Marital status: Widowed    Spouse name: Not on file   Number of children: 3   Years of education: Not on file   Highest education level: Bachelor's degree (e.g., BA, AB, BS)  Occupational History    Employer: RETIRED    Comment: accounting  Tobacco Use   Smoking status: Never Smoker   Smokeless tobacco: Never Used   Tobacco comment: Never smoker  Vaping Use   Vaping Use: Never used  Substance and Sexual Activity   Alcohol use: Yes    Alcohol/week: 0.0 standard drinks    Comment: Very Rarely   Drug use: No   Sexual activity: Not Currently    Birth control/protection: Surgical  Other Topics Concern   Not on file  Social History Narrative   Lives alone.   Social Determinants of Health   Financial Resource Strain: Low Risk    Difficulty of Paying Living Expenses: Not hard at all  Food Insecurity: No Food Insecurity   Worried About Charity fundraiser in the Last Year: Never true   [place] in the Last Year: Never true  Transportation Needs: No Transportation Needs   Lack of Transportation (Medical): No   Lack of Transportation (Non-Medical): No  Physical Activity: Inactive   Days of Exercise per Week: 0 days   Minutes of Exercise per Session: 0 min  Stress: No Stress Concern Present   Feeling of Stress : Only a little  Social Connections: Moderately Isolated   Frequency of Communication with Friends and Family: More than three times a week   Frequency of  Social Gatherings with Friends and Family: More than three times a week   Attends Religious Services: More than 4 times per year   Active Member of [employer] or Organizations: No   Attends Archivist Meetings: Never   Marital Status: Widowed  Intimate Partner Violence: Not At Risk   Fear of Current or Ex-Partner: No   Emotionally Abused: No   Physically Abused: No  Sexually Abused: No   Family History  Problem Relation Age of Onset   Arthritis Mother    Heart disease Mother    Cancer Father        pancreat   Pancreatic cancer Father    Arthritis Father        back issues    Cancer Brother        lung and liver   Liver cancer Brother    Lung cancer Brother    Heart disease Sister        Rheumatic Fever   Peptic Ulcer Sister    Diabetes Sister    Colon cancer Sister    Edema Sister    GI problems Sister        diverticulitis   Diabetes Sister    Neuropathy Sister    COPD Sister    Hiatal hernia Sister    GI problems Sister     Objective: Office vital signs reviewed. BP 136/82    Pulse (!) 54    Temp (!) 97.3 F (36.3 C) (Temporal)    Ht 5' 2"  (1.575 m)    Wt 134 lb (60.8 kg)    SpO2 98%    BMI 24.51 kg/m   Physical Examination:  General: Awake, alert, well nourished, No acute distress HEENT: Normal, sclera white, MMM Cardio: regular rate and rhythm, S1S2 heard, no murmurs appreciated Pulm: clear to auscultation bilaterally, no wheezes, rhonchi or rales; normal work of breathing on room air Extremities: warm, well perfused, No edema, cyanosis or clubbing; +2 pulses bilaterally MSK: ambulates independently   Assessment/ Plan: 84 y.o. female   1. Mixed hyperlipidemia We reviewed her labs today.  No indication for statin today  2. Essential hypertension Controlled.  Continue current regimen  3. Osteopenia of the elderly We discussed that Prolia is not indicated at her bone density.  I did reinforce need for adequate calcium,  vitamin D and weightbearing exercise.  Handout was provided  4. DDD (degenerative disc disease), lumbar Was doing well with PT traction.  I will reach out to her physical therapist to inquire about ongoing treatment.  Understand that ultimately it appears to be an issue with insurance.  However, I want to make sure that there is no contraindication given osteopenia.   No orders of the defined types were placed in this encounter.  No orders of the defined types were placed in this encounter.    [name], DO [facility] [phone]

## 2019-12-10 ENCOUNTER — Other Ambulatory Visit: Payer: Self-pay

## 2019-12-10 ENCOUNTER — Encounter (INDEPENDENT_AMBULATORY_CARE_PROVIDER_SITE_OTHER): Payer: Self-pay | Admitting: Internal Medicine

## 2019-12-10 ENCOUNTER — Ambulatory Visit (INDEPENDENT_AMBULATORY_CARE_PROVIDER_SITE_OTHER): Payer: Medicare Other | Admitting: Internal Medicine

## 2019-12-10 VITALS — BP 157/82 | HR 58 | Temp 98.6°F | Ht 62.0 in | Wt 135.2 lb

## 2019-12-10 DIAGNOSIS — K5100 Ulcerative (chronic) pancolitis without complications: Secondary | ICD-10-CM

## 2019-12-10 DIAGNOSIS — K219 Gastro-esophageal reflux disease without esophagitis: Secondary | ICD-10-CM | POA: Diagnosis not present

## 2019-12-10 NOTE — Patient Instructions (Signed)
Please call if you experience rectal bleeding or abdominal pain

## 2019-12-10 NOTE — Progress Notes (Signed)
Presenting complaint;  Follow-up for chronic ulcerative colitis and GERD.  Database and subjective:  Patient is 84 year old Caucasian female who has over 20-year history of pan ulcerative colitis as well as erosive reflux esophagitis complicated by esophageal stricture.  She also has moderate sized sliding hiatal hernia.  Last EGD was in August 2020 and her last colonoscopy was in March 2027. Last office visit was 6 months ago.  She says she is doing well.  Heartburn is well controlled with medication.  Only time she has heartburn is with food that she should not eat to begin with.  She denies dysphagia regurgitation hoarseness chronic cough or sore throat.  Bowels move daily.  She has formed stool.  She denies melena rectal bleeding or abdominal pain.  She says her daughter wants her to have Covid vaccine.  Patient says when she had a flu shot she developed hives and fever and was sick for 1 week.  She is not sure what to do. She had her schedule blood work 6 weeks ago which is below.  Current Medications: Outpatient Encounter Medications as of 12/10/2019  Medication Sig  . acetaminophen (TYLENOL 8 HOUR ARTHRITIS PAIN) 650 MG CR tablet Take 650 mg by mouth every 8 (eight) hours as needed. Patient states that she takes prn for arthritis in her knees.   . Bromelains 500 MG TABS Take 500 mg by mouth daily.  [name] 500 MG CAPS Take 1 capsule by mouth daily.   . Coenzyme Q10 (CO Q-10 PO) Take 1 tablet by mouth daily. With red yeast rice [employer]  . COLLAGEN PO Take 1 tablet by mouth daily. This is known as 1-2-3  . Cyanocobalamin (VITAMIN B 12 PO) Take 1,000 mcg by mouth daily.   . Ginkgo Biloba 40 MG TABS Take 40 mg by mouth daily.   [name] glucosamine-chondroitin 500-400 MG tablet Take 1 tablet by mouth 2 (two) times daily.    [name] Hyaluronic Acid-Vitamin C (HYALURONIC ACID PO) Take 80 mg by mouth daily.  . Mesalamine (ASACOL) 400 MG CPDR DR capsule TAKE 2 CAPSULES DAILY  . metoprolol  tartrate (LOPRESSOR) 25 MG tablet Take 1 tablet (25 mg total) by mouth 2 (two) times daily.  . MULTIPLE VITAMINS PO Take 1 tablet by mouth daily. In the mornings  . Nutritional Supplements (GRAPESEED EXTRACT PO) Take 60 mg by mouth daily.   [name] OAT BRAN SOLUBLE PO Take 1 tablet by mouth daily.  [name] OVER THE COUNTER MEDICATION Take 66 mg by mouth daily. Vision Gold Lutein  . OVER THE COUNTER MEDICATION Take 250 mg by mouth daily. Alphalipoic Acid 250 mg daily  . OVER THE COUNTER MEDICATION Ultimate Bone Support - Patient takes 1 by mouth daily.  . pantoprazole (PROTONIX) 40 MG tablet TAKE 1 TABLET DAILY BEFORE BREAKFAST  . pyridOXINE (VITAMIN B-6) 100 MG tablet Take 100 mg by mouth daily.   . Turmeric 500 MG CAPS Take 1 capsule by mouth 3 (three) times daily.   . Vitamins C E (CRANBERRY CONCENTRATE PO) Take 2,500 mg by mouth daily.   . Zinc 50 MG CAPS Take 50 mg by mouth daily.    No facility-administered encounter medications on file as of 12/10/2019.     Objective: Blood pressure (!) 157/82, pulse (!) 58, temperature 98.6 F (37 C), temperature source Oral, height 5' 2"  (1.575 m), weight 135 lb 3.2 oz (61.3 kg). Patient is alert and in no acute distress. She is wearing a mask. Conjunctiva is pink.  Sclera is nonicteric Oropharyngeal mucosa is normal. No neck masses or thyromegaly noted. Cardiac exam with regular rhythm normal S1 and S2. No murmur or gallop noted. Lungs are clear to auscultation. Abdomen is symmetrical's soft with mild tenderness at LLQ.  No guarding.  No organomegaly or masses. No LE edema or clubbing noted.  Labs/studies Results:  CBC Latest Ref Rng & Units 10/29/2019 05/23/2019 10/23/2018  WBC 3.4 - 10.8 x10E3/uL 5.3 5.5 5.8  Hemoglobin 11.1 - 15.9 g/dL 13.9 13.6 13.7  Hematocrit 34.0 - 46.6 % 41.7 39.8 40.1  Platelets 150 - 450 x10E3/uL 233 237 256    CMP Latest Ref Rng & Units 10/29/2019 05/17/2019 11/23/2018  Glucose 65 - 99 mg/dL 91 87 80  BUN 8 - 27 mg/dL 10 9 10    Creatinine 0.57 - 1.00 mg/dL 0.91 0.78 0.86  Sodium 134 - 144 mmol/L 143 144 141  Potassium 3.5 - 5.2 mmol/L 4.9 4.6 4.9  Chloride 96 - 106 mmol/L 102 104 101  CO2 20 - 29 mmol/L 27 25 25   Calcium 8.7 - 10.3 mg/dL 9.8 9.9 9.8  Total Protein 6.0 - 8.5 g/dL 6.9 6.9 -  Total Bilirubin 0.0 - 1.2 mg/dL 0.6 0.5 -  Alkaline Phos 48 - 121 IU/L 44(L) 45 -  AST 0 - 40 IU/L 18 21 -  ALT 0 - 32 IU/L 15 16 -    Hepatic Function Latest Ref Rng & Units 10/29/2019 05/17/2019 10/23/2018  Total Protein 6.0 - 8.5 g/dL 6.9 6.9 7.0  Albumin 3.6 - 4.6 g/dL 3.9 4.3 4.5  AST 0 - 40 IU/L 18 21 19   ALT 0 - 32 IU/L 15 16 21   Alk Phosphatase 48 - 121 IU/L 44(L) 45 37(L)  Total Bilirubin 0.0 - 1.2 mg/dL 0.6 0.5 0.5  Bilirubin, Direct 0.00 - 0.40 mg/dL - - 0.14    Lab data reviewed with patient.  Assessment:  #1.  Chronic ulcerative colitis.  Last colonoscopy was in March 2017.  She remains in remission.  She is on oral mesalamine.  No plans for surveillance colonoscopy given her age.  However if she has symptoms would consider diagnostic examination.  #2.  Erosive reflux esophagitis complicated by esophageal stricture.  Symptoms are well controlled with therapy.   Plan:  Patient will continue Asacol at a dose of 800 mg p.o. daily. She will continue pantoprazole 40 mg p.o. every morning. Office visit in 6 months. She may need help from an allergy specialist before considering Covid vaccine.

## 2020-02-05 ENCOUNTER — Telehealth (INDEPENDENT_AMBULATORY_CARE_PROVIDER_SITE_OTHER): Payer: Self-pay | Admitting: *Deleted

## 2020-02-05 NOTE — Telephone Encounter (Signed)
The patient at her last OV had ask about her getting the CoVid shot. The patient has history of a rash when getting the Flu Vaccination.  Dr.Rehman looked into this and it is recommended that if the rash was moderate , the patient should not take the CoVid shot.  The patient was called and a message was left on her voicemail with this recommendation. She was also advised that if she had any further questions to please call our office back.

## 2020-04-20 ENCOUNTER — Encounter: Payer: Self-pay | Admitting: *Deleted

## 2020-04-21 ENCOUNTER — Telehealth: Payer: Self-pay

## 2020-04-21 DIAGNOSIS — E782 Mixed hyperlipidemia: Secondary | ICD-10-CM

## 2020-04-21 DIAGNOSIS — I10 Essential (primary) hypertension: Secondary | ICD-10-CM

## 2020-04-21 NOTE — Telephone Encounter (Signed)
Pt has an appt to see Dr Lajuana Ripple on 05/05/20 for her 6 mth check up and wants to come in a few days before appt to have her lab work done.  Please put in lab order for patient.

## 2020-04-21 NOTE — Telephone Encounter (Signed)
What labs would you like ordered ?

## 2020-04-21 NOTE — Telephone Encounter (Signed)
Patient aware and verbalizes understanding. 

## 2020-04-21 NOTE — Telephone Encounter (Signed)
Labs are in

## 2020-04-24 ENCOUNTER — Other Ambulatory Visit (INDEPENDENT_AMBULATORY_CARE_PROVIDER_SITE_OTHER): Payer: Self-pay | Admitting: Internal Medicine

## 2020-04-29 ENCOUNTER — Other Ambulatory Visit: Payer: Medicare Other

## 2020-04-29 ENCOUNTER — Other Ambulatory Visit: Payer: Self-pay

## 2020-04-29 DIAGNOSIS — I10 Essential (primary) hypertension: Secondary | ICD-10-CM

## 2020-04-29 DIAGNOSIS — E782 Mixed hyperlipidemia: Secondary | ICD-10-CM

## 2020-04-29 LAB — CMP14+EGFR
ALT: 13 IU/L (ref 0–32)
AST: 21 IU/L (ref 0–40)
Albumin/Globulin Ratio: 1.6 (ref 1.2–2.2)
Albumin: 4.3 g/dL (ref 3.6–4.6)
Alkaline Phosphatase: 43 IU/L — ABNORMAL LOW (ref 44–121)
BUN/Creatinine Ratio: 12 (ref 12–28)
BUN: 10 mg/dL (ref 8–27)
Bilirubin Total: 0.6 mg/dL (ref 0.0–1.2)
CO2: 26 mmol/L (ref 20–29)
Calcium: 10.7 mg/dL — ABNORMAL HIGH (ref 8.7–10.3)
Chloride: 101 mmol/L (ref 96–106)
Creatinine, Ser: 0.85 mg/dL (ref 0.57–1.00)
GFR calc Af Amer: 74 mL/min/{1.73_m2} (ref >59)
GFR calc non Af Amer: 64 mL/min/{1.73_m2} (ref >59)
Globulin, Total: 2.7 g/dL (ref 1.5–4.5)
Glucose: 92 mg/dL (ref 65–99)
Potassium: 5.3 mmol/L — ABNORMAL HIGH (ref 3.5–5.2)
Sodium: 141 mmol/L (ref 134–144)
Total Protein: 7 g/dL (ref 6.0–8.5)

## 2020-04-29 LAB — LIPID PANEL
Chol/HDL Ratio: 3.3 ratio (ref 0.0–4.4)
Cholesterol, Total: 196 mg/dL (ref 100–199)
HDL: 60 mg/dL (ref >39)
LDL Chol Calc (NIH): 121 mg/dL — ABNORMAL HIGH (ref 0–99)
Triglycerides: 82 mg/dL (ref 0–149)
VLDL Cholesterol Cal: 15 mg/dL (ref 5–40)

## 2020-05-05 ENCOUNTER — Ambulatory Visit (INDEPENDENT_AMBULATORY_CARE_PROVIDER_SITE_OTHER): Payer: Medicare Other | Admitting: Family Medicine

## 2020-05-05 ENCOUNTER — Other Ambulatory Visit: Payer: Self-pay

## 2020-05-05 VITALS — BP 138/83 | HR 64 | Temp 98.5°F | Ht 62.0 in | Wt 133.0 lb

## 2020-05-05 DIAGNOSIS — E782 Mixed hyperlipidemia: Secondary | ICD-10-CM | POA: Diagnosis not present

## 2020-05-05 DIAGNOSIS — Z862 Personal history of diseases of the blood and blood-forming organs and certain disorders involving the immune mechanism: Secondary | ICD-10-CM

## 2020-05-05 DIAGNOSIS — R059 Cough, unspecified: Secondary | ICD-10-CM

## 2020-05-05 DIAGNOSIS — I10 Essential (primary) hypertension: Secondary | ICD-10-CM

## 2020-05-05 DIAGNOSIS — M8580 Other specified disorders of bone density and structure, unspecified site: Secondary | ICD-10-CM | POA: Diagnosis not present

## 2020-05-05 MED ORDER — BENZONATATE 100 MG PO CAPS: 100.0000 mg | ORAL_CAPSULE | Freq: Three times a day (TID) | ORAL | 0 refills | Status: DC | PRN

## 2020-05-05 NOTE — Progress Notes (Signed)
Subjective: CC: 80-monthfollow-up PCP: [name] DO HUVO:[id number] Schreffleris a 85y.o. female presenting to clinic today for:  1.  Hypertension with hyperlipidemia Patient is compliant with her metoprolol.  She is not currently treated for hyperlipidemia.  She takes over-the-counter supplements.  No chest pain, shortness of breath.  Recently had fasting labs and would like to review those  2.  Cough Patient reports about a 1 week history of cough.  Symptoms seem to be more prevalent after meals.  Denies any associated shortness of breath, wheezing, fever, myalgia or diarrhea.  She is prescribed Protonix for GERD but only uses this a few times per week.  She had 1 Covid exposure in the last 2 weeks.  She is not vaccinated against COVID.   ROS: Per HPI  Allergies  Allergen Reactions  . Penicillins Itching and Swelling    Patient states that she had a rash also Has patient had a PCN reaction causing immediate rash, facial/tongue/throat swelling, SOB or lightheadedness with hypotension: No Has patient had a PCN reaction causing severe rash involving mucus membranes or skin necrosis: No Has patient had a PCN reaction that required hospitalization No Has patient had a PCN reaction occurring within the last 10 years: No If all of the above answers are "NO", then may proceed with Cephalosporin use.   . Tape Itching  . Influenza Virus Vacc Split Pf Rash    Per Patient she was told by her PCP not to ever take this injection again   Past Medical History:  Diagnosis Date  . Anemia   . Arthritis   . [place]  . Carpal tunnel syndrome, bilateral   . Cataract   . Esophagitis   . GERD (gastroesophageal reflux disease)   . H/O hiatal hernia   . Osteopenia   . Scoliosis   . Shingles   . [place]   . Ulcerative colitis     Current Outpatient Medications:  .  acetaminophen (TYLENOL 8 HOUR ARTHRITIS PAIN) 650 MG CR  tablet, Take 650 mg by mouth every 8 (eight) hours as needed. Patient states that she takes prn for arthritis in her knees. , Disp: , Rfl:  .  Bromelains 500 MG TABS, Take 500 mg by mouth daily., Disp: , Rfl:  .  Cayenne 500 MG CAPS, Take 1 capsule by mouth daily. , Disp: , Rfl:  .  Coenzyme Q10 (CO Q-10 PO), Take 1 tablet by mouth daily. With red yeast rice [employer], Disp: , Rfl:  .  COLLAGEN PO, Take 1 tablet by mouth daily. This is known as 1-2-3, Disp: , Rfl:  .  Cyanocobalamin (VITAMIN B 12 PO), Take 1,000 mcg by mouth daily. , Disp: , Rfl:  .  Ginkgo Biloba 40 MG TABS, Take 40 mg by mouth daily. , Disp: , Rfl:  .  glucosamine-chondroitin 500-400 MG tablet, Take 1 tablet by mouth 2 (two) times daily.  , Disp: , Rfl:  .  Hyaluronic Acid-Vitamin C (HYALURONIC ACID PO), Take 80 mg by mouth daily., Disp: , Rfl:  .  Mesalamine (ASACOL) 400 MG CPDR DR capsule, TAKE 2 CAPSULES DAILY, Disp: 180 capsule, Rfl: 3 .  metoprolol tartrate (LOPRESSOR) 25 MG tablet, Take 1 tablet (25 mg total) by mouth 2 (two) times daily., Disp: 180 tablet, Rfl: 3 .  MULTIPLE VITAMINS PO, Take 1 tablet by mouth daily. In the mornings, Disp: , Rfl:  .  Nutritional Supplements (GRAPESEED EXTRACT PO), Take 60 mg by mouth daily. , Disp: , Rfl:  .  OAT BRAN SOLUBLE PO, Take 1 tablet by mouth daily., Disp: , Rfl:  .  OVER THE COUNTER MEDICATION, Take 66 mg by mouth daily. Vision Gold Lutein, Disp: , Rfl:  .  OVER THE COUNTER MEDICATION, Take 250 mg by mouth daily. Alphalipoic Acid 250 mg daily, Disp: , Rfl:  .  OVER THE COUNTER MEDICATION, Ultimate Bone Support - Patient takes 1 by mouth daily., Disp: , Rfl:  .  pantoprazole (PROTONIX) 40 MG tablet, TAKE 1 TABLET DAILY BEFORE BREAKFAST, Disp: 90 tablet, Rfl: 3 .  pyridOXINE (VITAMIN B-6) 100 MG tablet, Take 100 mg by mouth daily. , Disp: , Rfl:  .  Turmeric 500 MG CAPS, Take 1 capsule by mouth 3 (three) times daily. , Disp: , Rfl:  .  Vitamins C E (CRANBERRY  CONCENTRATE PO), Take 2,500 mg by mouth daily. , Disp: , Rfl:  .  Zinc 50 MG CAPS, Take 50 mg by mouth daily. , Disp: , Rfl:  Social History   Socioeconomic History  . Marital status: Widowed    Spouse name: Not on file  . Number of children: 3  . Years of education: Not on file  . Highest education level: Bachelor's degree (e.g., BA, AB, BS)  Occupational History    Employer: RETIRED    Comment: accounting  Tobacco Use  . Smoking status: Never Smoker  . Smokeless tobacco: Never Used  . Tobacco comment: Never smoker  Vaping Use  . Vaping Use: Never used  Substance and Sexual Activity  . Alcohol use: Yes    Alcohol/week: 0.0 standard drinks    Comment: Very Rarely  . Drug use: No  . Sexual activity: Not Currently    Birth control/protection: Surgical  Other Topics Concern  . Not on file  Social History Narrative   Lives alone.   Social Determinants of Health   Financial Resource Strain: Not on file  Food Insecurity: Not on file  Transportation Needs: Not on file  Physical Activity: Not on file  Stress: Not on file  Social Connections: Not on file  Intimate Partner Violence: Not on file   Family History  Problem Relation Age of Onset  . Arthritis Mother   . Heart disease Mother   . Cancer Father        pancreat  . Pancreatic cancer Father   . Arthritis Father        back issues   . Cancer Brother        lung and liver  . Liver cancer Brother   . Lung cancer Brother   . Heart disease Sister        Rheumatic Fever  . Peptic Ulcer Sister   . Diabetes Sister   . Colon cancer Sister   . Edema Sister   . GI problems Sister        diverticulitis  . Diabetes Sister   . Neuropathy Sister   . COPD Sister   . Hiatal hernia Sister   . GI problems Sister     Objective: Office vital signs reviewed. BP 138/83   Pulse 64   Temp 98.5 F (36.9 C) (Temporal)   Ht 5' 2"  (1.575 m)   Wt 133 lb (60.3 kg)   SpO2 97%   BMI 24.33 kg/m   Physical Examination:   General: Awake, alert, well nourished, No acute distress HEENT: Normal, sclera white, MMM Cardio:  regular rate and rhythm, S1S2 heard, no murmurs appreciated Pulm: clear to auscultation bilaterally, no wheezes, rhonchi or rales; normal work of breathing on room air  Assessment/ Plan: 85 y.o. female   Mixed hyperlipidemia  Essential hypertension  Cough - Plan: Novel Coronavirus, NAA ([facility]), Novel Coronavirus, NAA ([facility])  Osteopenia of the elderly  History of anemia - Plan: CBC  We reviewed her labs today.  Her blood pressure is controlled.  Continue current regimen.  With regards to her cough this may be a silent reflux.  She did not appear to be infected on exam but she has had a positive Covid exposure within the last 2 weeks.  Covid testing has been ordered today.  I have encouraged her to use her Protonix daily for the next couple of weeks.  We discussed red flag signs and symptoms warranting further evaluation.  She voiced good understanding.  We discussed obtaining DEXA scan prior to the 2-year interval given known osteopenia and increased FRAX score on her January 2021 DEXA.  She will schedule this at her earliest convenience  Additionally, she would like to be rechecked for anemia.  Last CBC checked in 2021 was normal and did not demonstrate any evidence of anemia.  No active bleeding  No orders of the defined types were placed in this encounter.  No orders of the defined types were placed in this encounter.    Lori Norlander, DO [place] [phone]

## 2020-05-05 NOTE — Patient Instructions (Signed)
Use Pantoprazole daily for next 2 weeks. Tessalon perles sent as needed for cough. Repeat COVID lab obtained.  It appears that you have a viral upper respiratory infection (cold).  Cold symptoms can last up to 2 weeks.  I recommend that you only use cold medications that are safe in high blood pressure like Coricidin (generic is fine).  Other cold medications can increase your blood pressure.    - Get plenty of rest and drink plenty of fluids. - Try to breathe moist air. Use a cold mist humidifier. - Consume warm fluids (soup or tea) to provide relief for a stuffy nose and to loosen phlegm. - For nasal stuffiness, try saline nasal spray or a Neti Pot.  Afrin nasal spray can also be used but this product should not be used longer than 3 days or it will cause rebound nasal stuffiness (worsening nasal congestion). - For sore throat pain relief: use chloraseptic spray, suck on throat lozenges, hard candy or popsicles; gargle with warm salt water (1/4 tsp. salt per 8 oz. of water); and eat soft, bland foods. - Eat a well-balanced diet. If you cannot, ensure you are getting enough nutrients by taking a daily multivitamin. - Avoid dairy products, as they can thicken phlegm. - Avoid alcohol, as it impairs your body's immune system.  CONTACT YOUR DOCTOR IF YOU EXPERIENCE ANY OF THE FOLLOWING: - High fever - Ear pain - Sinus-type headache - Unusually severe cold symptoms - Cough that gets worse while other cold symptoms improve - Flare up of any chronic lung problem, such as asthma - Your symptoms persist longer than 2 weeks

## 2020-05-07 ENCOUNTER — Encounter: Payer: Self-pay | Admitting: Family Medicine

## 2020-05-07 LAB — SARS-COV-2, NAA 2 DAY TAT

## 2020-05-07 LAB — NOVEL CORONAVIRUS, NAA: SARS-CoV-2, NAA: DETECTED — AB

## 2020-05-08 ENCOUNTER — Telehealth: Payer: Self-pay | Admitting: Family

## 2020-05-08 ENCOUNTER — Telehealth: Payer: Self-pay

## 2020-05-08 NOTE — Telephone Encounter (Signed)
I spoke with Lori Soto regarding potential treatment with Remdesivir or Sotrovimab. She is continuing to have a deep cough and fatigue. At this point she does not feel that she wants to take a risk on any new medications. I ensured she had the hotline number should she change her mind and answered her questions. Advised to seek further care if shortness of breath develops or symptoms worsen.   Terri Piedra, NP 05/08/2020 3:11 PM

## 2020-05-08 NOTE — Telephone Encounter (Signed)
I connected by phone with Lori Soto on 05/08/2020 at 1:30 PM to discuss the potential use of a new treatment for mild to moderate COVID-19 viral infection in non-hospitalized patients.  This patient is a 85 y.o. female that meets the FDA criteria for Emergency Use Authorization of COVID monoclonal antibody sotrovimab.  Has a (+) direct SARS-CoV-2 viral test result  Has mild or moderate COVID-19   Is NOT hospitalized due to COVID-19  Is within 10 days of symptom onset  Has at least one of the high risk factor(s) for progression to severe COVID-19 and/or hospitalization as defined in EUA.  Specific high risk criteria : Older age (>/= 83 yo) and Other high risk medical condition per CDC:  Heart disease   I have spoken and communicated the following to the patient or parent/caregiver regarding COVID monoclonal antibody treatment:  1. FDA has authorized the emergency use for the treatment of mild to moderate COVID-19 in adults and pediatric patients with positive results of direct SARS-CoV-2 viral testing who are 89 years of age and older weighing at least 40 kg, and who are at high risk for progressing to severe COVID-19 and/or hospitalization.  2. The significant known and potential risks and benefits of COVID monoclonal antibody, and the extent to which such potential risks and benefits are unknown.  3. Information on available alternative treatments and the risks and benefits of those alternatives, including clinical trials.  4. Patients treated with COVID monoclonal antibody should continue to self-isolate and use infection control measures (e.g., wear mask, isolate, social distance, avoid sharing personal items, clean and disinfect "high touch" surfaces, and frequent handwashing) according to CDC guidelines.   5. The patient or parent/caregiver has the option to accept or refuse COVID monoclonal antibody treatment.  After reviewing this information with the patient, the patient  has agreed to receive one of the available covid 19 monoclonal antibodies and will be provided an appropriate fact sheet prior to infusion. Marcello Moores, RN 05/08/2020 1:30 PM

## 2020-05-15 ENCOUNTER — Encounter: Payer: Self-pay | Admitting: Family Medicine

## 2020-05-15 ENCOUNTER — Telehealth: Payer: Self-pay | Admitting: *Deleted

## 2020-05-15 NOTE — Telephone Encounter (Signed)
Pt asked to come to have lungs checked. She is aware she needs an apt. She is upset. She then asked for Lori Soto to call her back.

## 2020-05-15 NOTE — Telephone Encounter (Signed)
Pt called back and we discussed her symptoms: She is getting better. She still has some cough, chest feels tight in the AM, but loosens as the day goes by. She is able to get some congestion up and out. She is not running a fever.   She wanted her lungs check for precaution. She is aware that we do not have any availability for today.    She is taking a mucinex-like medication, has the tessalon pearles and is drinking plenty of water.   She is aware that we have on-call providers at night and over the weekend, and if she were to slip back into the wrong direction (get worse) to call or Seek care. Also aware of close by urgent care.   She will update [place] next week if needed.

## 2020-05-15 NOTE — Telephone Encounter (Signed)
Pt had sent patient message that she would like a call from myself - NA on one line and LM on the other.

## 2020-05-28 ENCOUNTER — Telehealth: Payer: Self-pay | Admitting: Family Medicine

## 2020-05-28 ENCOUNTER — Telehealth: Payer: Self-pay

## 2020-05-28 ENCOUNTER — Ambulatory Visit: Payer: Self-pay | Admitting: *Deleted

## 2020-05-28 NOTE — Telephone Encounter (Signed)
This was a new encounter for same message

## 2020-05-28 NOTE — Telephone Encounter (Signed)
Patient called in asking to speak to a nurse has some question about lingering symptoms from Covid. Ph# [phone]   Call to patient- patient state she has been followed during her COVID infection by her PCP- she has concerns about her lungs and the congestion she still has. Her symptoms are much better- but she is bothered by the lingering cough and congestion. Advised patient to contact PCP for post COVID check. Reason for Disposition . [1] PERSISTING SYMPTOMS OF COVID-19 AND [2] NO medical visit for COVID-19 in past 2 weeks  Answer Assessment - Initial Assessment Questions 1. COVID-19 ONSET: "When did the symptoms of COVID-19 first start?"     1/4- chest tightness 2. DIAGNOSIS CONFIRMATION: "How were you diagnosed?" (e.g., COVID-19 oral or nasal viral test; COVID-19 antibody test; doctor visit)     PCP 3. MAIN SYMPTOM:  "What is your main concern or symptom right now?" (e.g., breathing difficulty, cough, fatigue. loss of smell)     Slight chest congestion 4. SYMPTOM ONSET: "When did the  chest congestion  start?"     1/7 5. BETTER-SAME-WORSE: "Are you getting better, staying the same, or getting worse over the last 1 to 2 weeks?"     better 6. RECENT MEDICAL VISIT: "Have you been seen by a healthcare provider (doctor, NP, PA) for these persisting COVID-19 symptoms?" If Yes, ask: "When were you seen?" (e.g., date)     No 7. COUGH: "Do you have a cough?" If Yes, ask: "How bad is the cough?"       In the morning- intermittent during the day 8. FEVER: "Do you have a fever?" If Yes, ask: "What is your temperature, how was it measured, and when did it start?"     no 9. BREATHING DIFFICULTY: "Are you having any trouble breathing?" If Yes, ask: "How bad is your breathing?" (e.g., mild, moderate, severe)    - MILD: No SOB at rest, mild SOB with walking, speaks normally in sentences, can lie down, no retractions, pulse < 100.    - MODERATE: SOB at rest, SOB with minimal exertion and prefers to  sit, cannot lie down flat, speaks in phrases, mild retractions, audible wheezing, pulse 100-120.    - SEVERE: Very SOB at rest, speaks in single words, struggling to breathe, sitting hunched forward, retractions, pulse > 120       Patient reports her breathing is good- no SOB 10. HIGH RISK DISEASE: "Do you have any chronic medical problems?" (e.g., asthma, heart or lung disease, weak immune system, obesity, etc.)       age 85. VACCINE: "Have you gotten the COVID-19 vaccine?" If Yes ask: "Which one, how many shots, when did you get it?"       no 12. PREGNANCY: "Is there any chance you are pregnant?" "When was your last menstrual period?"       n/a 13. OTHER SYMPTOMS: "Do you have any other symptoms?"  (e.g., fatigue, headache, muscle pain, weakness)       no  Protocols used: CORONAVIRUS (COVID-19) PERSISTING SYMPTOMS FOLLOW-UP CALL-A-AH

## 2020-05-28 NOTE — Telephone Encounter (Signed)
Appointment made for [facility]

## 2020-05-28 NOTE — Telephone Encounter (Signed)
R/C to [employer]

## 2020-05-28 NOTE — Telephone Encounter (Signed)
Lmtcb.

## 2020-05-29 ENCOUNTER — Encounter: Payer: Self-pay | Admitting: Nurse Practitioner

## 2020-05-29 ENCOUNTER — Other Ambulatory Visit: Payer: Self-pay

## 2020-05-29 ENCOUNTER — Ambulatory Visit (INDEPENDENT_AMBULATORY_CARE_PROVIDER_SITE_OTHER): Payer: Medicare Other

## 2020-05-29 ENCOUNTER — Ambulatory Visit (INDEPENDENT_AMBULATORY_CARE_PROVIDER_SITE_OTHER): Payer: Medicare Other | Admitting: Nurse Practitioner

## 2020-05-29 VITALS — BP 128/79 | HR 80 | Temp 97.6°F

## 2020-05-29 DIAGNOSIS — R0989 Other specified symptoms and signs involving the circulatory and respiratory systems: Secondary | ICD-10-CM

## 2020-05-29 DIAGNOSIS — R059 Cough, unspecified: Secondary | ICD-10-CM | POA: Diagnosis not present

## 2020-05-29 NOTE — Progress Notes (Signed)
Acute Office Visit  Subjective:    Patient ID: Lori Soto, female    DOB: [birth year], 85 y.o.   MRN: [id number]  Chief Complaint  Patient presents with  . Cough    Cough This is a recurrent problem. The current episode started 1 to 4 weeks ago. The problem has been unchanged. The problem occurs constantly. The cough is non-productive. Associated symptoms include headaches, nasal congestion and shortness of breath. Pertinent negatives include no chest pain, fever, sore throat or wheezing. Nothing (Today for COVID-19 2 weeks ago) aggravates the symptoms.     Past Medical History:  Diagnosis Date  . Anemia   . Arthritis   . Cancer ([place])    skin cancer on nose  . Carpal tunnel syndrome, bilateral   . Cataract   . Esophagitis   . GERD (gastroesophageal reflux disease)   . H/O hiatal hernia   . Osteopenia   . Scoliosis   . Shingles   . SVT (supraventricular tachycardia) ([place])   . Ulcerative colitis     Past Surgical History:  Procedure Laterality Date  . ABDOMINAL HYSTERECTOMY    . BUNIONECTOMY     Bilateral  . CARPAL TUNNEL RELEASE Left 03/10/2015   Procedure: LEFT CARPAL TUNNEL RELEASE;  Surgeon: Daryll Brod, MD;  Location: [facility];  Service: Orthopedics;  Laterality: Left;  . CARPAL TUNNEL RELEASE Right 10/31/2017   Procedure: RIGHT CARPAL TUNNEL RELEASE;  Surgeon: Daryll Brod, MD;  Location: [facility];  Service: Orthopedics;  Laterality: Right;  . CATARACT EXTRACTION W/PHACO  05/28/2012   Procedure: CATARACT EXTRACTION PHACO AND INTRAOCULAR LENS PLACEMENT (IOC);  Surgeon: Tonny Branch, MD;  Location: [facility];  Service: Ophthalmology;  Laterality: Right;  CDE=12.84  . CATARACT EXTRACTION W/PHACO  06/07/2012   Procedure: CATARACT EXTRACTION PHACO AND INTRAOCULAR LENS PLACEMENT (IOC);  Surgeon: Tonny Branch, MD;  Location: [facility];  Service: Ophthalmology;  Laterality: Left;  CDE 17.60  . COLONOSCOPY    . COLONOSCOPY N/A 07/08/2015   Procedure:  COLONOSCOPY;  Surgeon: Rogene Houston, MD;  [facility];  Service: Endoscopy;  Laterality: N/A;  930  . ESOPHAGEAL DILATION N/A 07/08/2015   Procedure: ESOPHAGEAL DILATION;  Surgeon: Rogene Houston, MD;  [facility];  Service: Endoscopy;  Laterality: N/A;  . ESOPHAGOGASTRODUODENOSCOPY N/A 07/08/2015   Procedure: ESOPHAGOGASTRODUODENOSCOPY (EGD);  Surgeon: Rogene Houston, MD;  [facility];  Service: Endoscopy;  Laterality: N/A;  . Hernia     Right inguinal  . HERNIA REPAIR  1961   right inguinal hernia  . UPPER GASTROINTESTINAL ENDOSCOPY      Family History  Problem Relation Age of Onset  . Arthritis Mother   . Heart disease Mother   . Cancer Father        pancreat  . Pancreatic cancer Father   . Arthritis Father        back issues   . Cancer Brother        lung and liver  . Liver cancer Brother   . Lung cancer Brother   . Heart disease Sister        Rheumatic Fever  . Peptic Ulcer Sister   . Diabetes Sister   . Colon cancer Sister   . Edema Sister   . GI problems Sister        diverticulitis  . Diabetes Sister   . Neuropathy Sister   . COPD Sister   . Hiatal hernia  Sister   . GI problems Sister     Social History   Socioeconomic History  . Marital status: Widowed    Spouse name: Not on file  . Number of children: 3  . Years of education: Not on file  . Highest education level: Bachelor's degree (e.g., BA, AB, BS)  Occupational History    Employer: RETIRED    Comment: accounting  Tobacco Use  . Smoking status: Never Smoker  . Smokeless tobacco: Never Used  . Tobacco comment: Never smoker  Vaping Use  . Vaping Use: Never used  Substance and Sexual Activity  . Alcohol use: Yes    Alcohol/week: 0.0 standard drinks    Comment: Very Rarely  . Drug use: No  . Sexual activity: Not Currently    Birth control/protection: Surgical  Other Topics Concern  . Not on file  Social History Narrative   Lives alone.   Social  Determinants of Health   Financial Resource Strain: Not on file  Food Insecurity: Not on file  Transportation Needs: Not on file  Physical Activity: Not on file  Stress: Not on file  Social Connections: Not on file  Intimate Partner Violence: Not on file    Outpatient Medications Prior to Visit  Medication Sig Dispense Refill  . acetaminophen (TYLENOL) 650 MG CR tablet Take 650 mg by mouth every 8 (eight) hours as needed. Patient states that she takes prn for arthritis in her knees.    . benzonatate (TESSALON PERLES) 100 MG capsule Take 1 capsule (100 mg total) by mouth 3 (three) times daily as needed. 20 capsule 0  . Bromelains 500 MG TABS Take 500 mg by mouth daily.    [name] 500 MG CAPS Take 1 capsule by mouth daily.     . Coenzyme Q10 (CO Q-10 PO) Take 1 tablet by mouth daily. With red yeast rice [employer]    . COLLAGEN PO Take 1 tablet by mouth daily. This is known as 1-2-3    . Cyanocobalamin (VITAMIN B 12 PO) Take 1,000 mcg by mouth daily.    . Ginkgo Biloba 40 MG TABS Take 40 mg by mouth daily.    [name] glucosamine-chondroitin 500-400 MG tablet Take 1 tablet by mouth 2 (two) times daily.    [name] Hyaluronic Acid-Vitamin C (HYALURONIC ACID PO) Take 80 mg by mouth daily.    . Mesalamine (ASACOL) 400 MG CPDR DR capsule TAKE 2 CAPSULES DAILY 180 capsule 3  . metoprolol tartrate (LOPRESSOR) 25 MG tablet Take 1 tablet (25 mg total) by mouth 2 (two) times daily. 180 tablet 3  . MULTIPLE VITAMINS PO Take 1 tablet by mouth daily. In the mornings    . Nutritional Supplements (GRAPESEED EXTRACT PO) Take 60 mg by mouth daily.     [name] OAT BRAN SOLUBLE PO Take 1 tablet by mouth daily.    [name] OVER THE COUNTER MEDICATION Take 66 mg by mouth daily. Vision Gold Lutein    . OVER THE COUNTER MEDICATION Take 250 mg by mouth daily. Alphalipoic Acid 250 mg daily    . OVER THE COUNTER MEDICATION Ultimate Bone Support - Patient takes 1 by mouth daily.    . pantoprazole (PROTONIX) 40 MG tablet TAKE 1  TABLET DAILY BEFORE BREAKFAST 90 tablet 3  . pyridOXINE (VITAMIN B-6) 100 MG tablet Take 100 mg by mouth daily.    . Turmeric 500 MG CAPS Take 1 capsule by mouth 3 (three) times daily.     . Vitamins  C E (CRANBERRY CONCENTRATE PO) Take 2,500 mg by mouth daily.    . Zinc 50 MG CAPS Take 50 mg by mouth daily.     No facility-administered medications prior to visit.    Allergies  Allergen Reactions  . Penicillins Itching and Swelling    Patient states that she had a rash also Has patient had a PCN reaction causing immediate rash, facial/tongue/throat swelling, SOB or lightheadedness with hypotension: No Has patient had a PCN reaction causing severe rash involving mucus membranes or skin necrosis: No Has patient had a PCN reaction that required hospitalization No Has patient had a PCN reaction occurring within the last 10 years: No If all of the above answers are "NO", then may proceed with Cephalosporin use.   . Tape Itching  . Influenza Virus Vacc Split Pf Rash    Per Patient she was told by her PCP not to ever take this injection again    Review of Systems  Constitutional: Negative for fever.  HENT: Negative for sore throat.   Respiratory: Positive for cough and shortness of breath. Negative for wheezing.   Cardiovascular: Negative for chest pain.  Gastrointestinal: Negative.   Neurological: Positive for headaches.  Psychiatric/Behavioral: Negative.   All other systems reviewed and are negative.      Objective:    Physical Exam Vitals reviewed.  Constitutional:      Appearance: Normal appearance.  HENT:     Head: Normocephalic.     Nose: Congestion present.  Eyes:     Conjunctiva/sclera: Conjunctivae normal.  Cardiovascular:     Rate and Rhythm: Normal rate and regular rhythm.     Heart sounds: Normal heart sounds.  Pulmonary:     Effort: Pulmonary effort is normal.     Breath sounds: No wheezing.     Comments: cough Abdominal:     General: Bowel sounds are  normal.  Neurological:     Mental Status: She is alert and oriented to person, place, and time.  Psychiatric:        Behavior: Behavior normal.     BP 128/79   Pulse 80   Temp 97.6 F (36.4 C)   SpO2 99%  Wt Readings from Last 3 Encounters:  05/05/20 133 lb (60.3 kg)  12/10/19 135 lb 3.2 oz (61.3 kg)  11/01/19 134 lb (60.8 kg)    Health Maintenance Due  Topic Date Due  . MAMMOGRAM  02/13/2020      Lab Results  Component Value Date   TSH 1.660 10/29/2019   Lab Results  Component Value Date   WBC 5.3 10/29/2019   HGB 13.9 10/29/2019   HCT 41.7 10/29/2019   MCV 89 10/29/2019   PLT 233 10/29/2019   Lab Results  Component Value Date   NA 141 04/29/2020   K 5.3 (H) 04/29/2020   CO2 26 04/29/2020   GLUCOSE 92 04/29/2020   BUN 10 04/29/2020   CREATININE 0.85 04/29/2020   BILITOT 0.6 04/29/2020   ALKPHOS 43 (L) 04/29/2020   AST 21 04/29/2020   ALT 13 04/29/2020   PROT 7.0 04/29/2020   ALBUMIN 4.3 04/29/2020   CALCIUM 10.7 (H) 04/29/2020   ANIONGAP 13 08/26/2018   Lab Results  Component Value Date   CHOL 196 04/29/2020   Lab Results  Component Value Date   HDL 60 04/29/2020   Lab Results  Component Value Date   LDLCALC 121 (H) 04/29/2020   Lab Results  Component Value Date   TRIG 82 04/29/2020  Lab Results  Component Value Date   CHOLHDL 3.3 04/29/2020   No results found for: HGBA1C     Assessment & Plan:   Problem List Items Addressed This Visit      Respiratory   Chest congestion    Chest-Xray ordered to rule out Pneumonia results pending      Relevant Orders   DG Chest 2 View     Other   Cough - Primary    Symptoms not well managed. Patient was positive for COVID-19 three weeks prior to visit.patient is reporting chest tightness and slight shortness of breath.  Chest x-ray ordered to rule out pneumonia.Education handout provided to patient.  Follow up with worsening or unresolved symptom  OI      Relevant Orders   DG Chest  2 View          Ivy Lynn, NP

## 2020-05-29 NOTE — Patient Instructions (Signed)
10 Things You Can Do to Manage Your COVID-19 Symptoms at Home If you have possible or confirmed COVID-19: 1. Stay home except to get medical care. 2. Monitor your symptoms carefully. If your symptoms get worse, call your healthcare provider immediately. 3. Get rest and stay hydrated. 4. If you have a medical appointment, call the healthcare provider ahead of time and tell them that you have or may have COVID-19. 5. For medical emergencies, call 911 and notify the dispatch personnel that you have or may have COVID-19. 6. Cover your cough and sneezes with a tissue or use the inside of your elbow. 7. Wash your hands often with soap and water for at least 20 seconds or clean your hands with an alcohol-based hand sanitizer that contains at least 60% alcohol. 8. As much as possible, stay in a specific room and away from other people in your home. Also, you should use a separate bathroom, if available. If you need to be around other people in or outside of the home, wear a mask. 9. Avoid sharing personal items with other people in your household, like dishes, towels, and bedding. 10. Clean all surfaces that are touched often, like counters, tabletops, and doorknobs. Use household cleaning sprays or wipes according to the label instructions. [email or web address] 11/15/2019 This information is not intended to replace advice given to you by your health care provider. Make sure you discuss any questions you have with your health care provider. Document Revised: 03/02/2020 Document Reviewed: 03/02/2020 Elsevier Patient Education  2021 [place]. Cough, Adult A cough helps to clear your throat and lungs. A cough may be a sign of an illness or another medical condition. An acute cough may only last 2-3 weeks, while a chronic cough may last 8 or more weeks. Many things can cause a cough. They include:  Germs (viruses or bacteria) that attack the airway.  Breathing in things that bother (irritate) your  lungs.  Allergies.  Asthma.  Mucus that runs down the back of your throat (postnasal drip).  Smoking.  Acid backing up from the stomach into the tube that moves food from the mouth to the stomach (gastroesophageal reflux).  Some medicines.  Lung problems.  Other medical conditions, such as heart failure or a blood clot in the lung (pulmonary embolism). Follow these instructions at home: Medicines  Take over-the-counter and prescription medicines only as told by your doctor.  Talk with your doctor before you take medicines that stop a cough (cough suppressants). Lifestyle  Do not smoke, and try not to be around smoke. Do not use any products that contain nicotine or tobacco, such as cigarettes, e-cigarettes, and chewing tobacco. If you need help quitting, ask your doctor.  Drink enough fluid to keep your pee (urine) pale yellow.  Avoid caffeine.  Do not drink alcohol if your doctor tells you not to drink.   General instructions  Watch for any changes in your cough. Tell your doctor about them.  Always cover your mouth when you cough.  Stay away from things that make you cough, such as perfume, candles, campfire smoke, or cleaning products.  If the air is dry, use a cool mist vaporizer or humidifier in your home.  If your cough is worse at night, try using extra pillows to raise your head up higher while you sleep.  Rest as needed.  Keep all follow-up visits as told by your doctor. This is important.   Contact a doctor if:  You have new symptoms.  You cough up pus.  Your cough does not get better after 2-3 weeks, or your cough gets worse.  Cough medicine does not help your cough and you are not sleeping well.  You have pain that gets worse or pain that is not helped with medicine.  You have a fever.  You are losing weight and you do not know why.  You have night sweats. Get help right away if:  You cough up blood.  You have trouble breathing.  Your  heartbeat is very fast. These symptoms may be an emergency. Do not wait to see if the symptoms will go away. Get medical help right away. Call your local emergency services (911 in the [place] Do not drive yourself to the hospital. Summary  A cough helps to clear your throat and lungs. Many things can cause a cough.  Take over-the-counter and prescription medicines only as told by your doctor.  Always cover your mouth when you cough.  Contact a doctor if you have new symptoms or you have a cough that does not get better or gets worse. This information is not intended to replace advice given to you by your health care provider. Make sure you discuss any questions you have with your health care provider. Document Revised: 06/07/2019 Document Reviewed: 05/07/2018 Elsevier Patient Education  [place].

## 2020-05-29 NOTE — Assessment & Plan Note (Signed)
Chest-Xray ordered to rule out Pneumonia results pending

## 2020-05-29 NOTE — Assessment & Plan Note (Signed)
Symptoms not well managed. Patient was positive for COVID-19 three weeks prior to visit.patient is reporting chest tightness and slight shortness of breath.  Chest x-ray ordered to rule out pneumonia.Education handout provided to patient.  Follow up with worsening or unresolved symptom  OI

## 2020-06-01 DIAGNOSIS — K449 Diaphragmatic hernia without obstruction or gangrene: Secondary | ICD-10-CM | POA: Diagnosis not present

## 2020-06-01 DIAGNOSIS — R059 Cough, unspecified: Secondary | ICD-10-CM | POA: Diagnosis not present

## 2020-06-02 ENCOUNTER — Encounter: Payer: Self-pay | Admitting: Nurse Practitioner

## 2020-06-02 ENCOUNTER — Other Ambulatory Visit: Payer: Self-pay | Admitting: Nurse Practitioner

## 2020-06-02 MED ORDER — AZITHROMYCIN 250 MG PO TABS
ORAL_TABLET | ORAL | 0 refills | Status: DC
Start: 2020-06-02 — End: 2020-06-15

## 2020-06-11 ENCOUNTER — Telehealth: Payer: Self-pay

## 2020-06-11 NOTE — Telephone Encounter (Signed)
Patient aware Lori Soto and Lori Soto not here will send back to [name] when she is here.

## 2020-06-12 NOTE — Telephone Encounter (Signed)
4-6 weeks after her last x-ray, preferable 6 weeks. If no new symptoms. thanks

## 2020-06-12 NOTE — Telephone Encounter (Signed)
Left detailed message per [facility]

## 2020-06-15 ENCOUNTER — Ambulatory Visit (INDEPENDENT_AMBULATORY_CARE_PROVIDER_SITE_OTHER): Payer: Medicare Other | Admitting: *Deleted

## 2020-06-15 DIAGNOSIS — Z0000 Encounter for general adult medical examination without abnormal findings: Secondary | ICD-10-CM | POA: Diagnosis not present

## 2020-06-15 NOTE — Patient Instructions (Signed)
  [name] Maintenance Summary and Written Plan of Care  Lori Soto ,  Thank you for allowing me to perform your Medicare Annual Wellness Visit and for your ongoing commitment to your health.   Health Maintenance & Immunization History Health Maintenance  Topic Date Due  . MAMMOGRAM  02/13/2020  . DEXA SCAN  05/22/2021  . TETANUS/TDAP  01/22/2025  . PNA vac Low Risk Adult  Completed  . INFLUENZA VACCINE  Discontinued  . COVID-19 Vaccine  Discontinued   Immunization History  Administered Date(s) Administered  . Pneumococcal Conjugate-13 02/26/2014  . Pneumococcal Polysaccharide-23 03/20/2012  . Td 01/23/2015  . Tdap 01/23/2015    These are the patient goals that we discussed: Goals Addressed            This Visit's Progress   . awv       06/15/2020 AWV Goal: Fall Prevention  . Over the next year, patient will decrease their risk for falls by: o Using assistive devices, such as a cane or walker, as needed o Identifying fall risks within their home and correcting them by: - Removing throw rugs - Adding handrails to stairs or ramps - Removing clutter and keeping a clear pathway throughout the home - Increasing light, especially at night - Adding shower handles/bars - Raising toilet seat o Identifying potential personal risk factors for falls: - Medication side effects - Incontinence/urgency - Vestibular dysfunction - Hearing loss - Musculoskeletal disorders - Neurological disorders - Orthostatic hypotension          This is a list of Health Maintenance Items that are overdue or due now: Health Maintenance Due  Topic Date Due  . MAMMOGRAM  02/13/2020     Orders/Referrals Placed Today: No orders of the defined types were placed in this encounter.  (Contact our referral department at [phone] if you have not spoken with someone about your referral appointment within the next 5 days)    Follow-up Plan . Follow-up with  [name], DO as planned . Schedule your yearly eye exam . Schedule your yearly mammogram

## 2020-06-15 NOTE — Progress Notes (Signed)
MEDICARE ANNUAL WELLNESS VISIT  06/15/2020  Telephone Visit Disclaimer This Medicare AWV was conducted by telephone due to national recommendations for restrictions regarding the COVID-19 Pandemic (e.g. social distancing).  I verified, using two identifiers, that I am speaking with Lori Soto or their authorized healthcare agent. I discussed the limitations, risks, security, and privacy concerns of performing an evaluation and management service by telephone and the potential availability of an in-person appointment in the future. The patient expressed understanding and agreed to proceed.  Location of Patient: Home Location of Provider (nurse):  [facility]  Subjective:    Lori Soto is a 85 y.o. female patient of [name], DO who had a Medicare Annual Wellness Visit today via telephone. Shirly is Retired and lives alone. she has 3 children. she reports that she is socially active and does interact with friends/family regularly. she is minimally physically active and enjoys volunteering for meals on wheel, she is involved in church, she enjoys playing [place], reading, cooking and baking.  Patient Care Team: [name], DO as PCP - General (Family Medicine) Minus Breeding, MD as PCP - Cardiology (Cardiology) Rogene Houston, MD as Consulting Physician (Gastroenterology) Gaynelle Arabian, MD as Consulting Physician (Orthopedic Surgery) Daryll Brod, MD as Consulting Physician (Orthopedic Surgery) Particia Nearing, [place] (Optometry) Ilean China, RN as Registered Nurse  Advanced Directives 06/15/2020 06/13/2019 08/26/2018 08/24/2018 06/26/2018 12/02/2017 11/21/2017  Does Patient Have a Medical Advance Directive? Yes Yes No Yes Yes Yes Yes  Type of Paramedic of [place];Living will [place];Living will Healthcare Power of [place];Living  will Living will;Healthcare Power of Attorney  Does patient want to make changes to medical advance directive? No - Patient declined No - Patient declined - - - - No - Patient declined  Copy of [place] in Chart? - No - copy requested No - copy requested - - - No - copy requested  Would patient like information on creating a medical advance directive? - - No - Patient declined - - - -  Pre-existing out of facility DNR order (yellow form or pink MOST form) - - - - - - -    Hospital Utilization Over the Past 12 Months: # of hospitalizations or ER visits: 0 # of surgeries: 0  Review of Systems    Patient reports that her overall health is unchanged compared to last year.  History obtained from chart review and the patient  Patient Reported Readings (BP, Pulse, CBG, Weight, etc) none  Pain Assessment Pain : No/denies pain     Current Medications & Allergies (verified) Allergies as of 06/15/2020      Reactions   Penicillins Itching, Swelling   Patient states that she had a rash also Has patient had a PCN reaction causing immediate rash, facial/tongue/throat swelling, SOB or lightheadedness with hypotension: No Has patient had a PCN reaction causing severe rash involving mucus membranes or skin necrosis: No Has patient had a PCN reaction that required hospitalization No Has patient had a PCN reaction occurring within the last 10 years: No If all of the above answers are "NO", then may proceed with Cephalosporin use.   Tape Itching   Influenza Virus Vacc Split Pf Rash   Per Patient she was told by her PCP not to ever take this injection again      Medication List  Accurate as of June 15, 2020  5:03 PM. If you have any questions, ask your nurse or doctor.        STOP taking these medications   azithromycin 250 MG tablet Commonly known as: ZITHROMAX     TAKE these medications   acetaminophen 650 MG CR tablet Commonly known as: TYLENOL Take 650  mg by mouth every 8 (eight) hours as needed. Patient states that she takes prn for arthritis in her knees.   benzonatate 100 MG capsule Commonly known as: Tessalon Perles Take 1 capsule (100 mg total) by mouth 3 (three) times daily as needed.   Bromelains 500 MG Tabs Take 500 mg by mouth daily.   Cayenne 500 MG Caps Take 1 capsule by mouth daily.   CO Q-10 PO Take 1 tablet by mouth daily. With red yeast rice Pur[employer] Brand   COLLAGEN PO Take 1 tablet by mouth daily. This is known as 1-2-3   CRANBERRY CONCENTRATE PO Take 2,500 mg by mouth daily.   Ginkgo Biloba 40 MG Tabs Take 40 mg by mouth daily.   glucosamine-chondroitin 500-400 MG tablet Take 1 tablet by mouth 2 (two) times daily.   GRAPESEED EXTRACT PO Take 60 mg by mouth daily.   HYALURONIC ACID PO Take 80 mg by mouth daily.   Mesalamine 400 MG Cpdr DR capsule Commonly known as: ASACOL TAKE 2 CAPSULES DAILY   metoprolol tartrate 25 MG tablet Commonly known as: LOPRESSOR Take 1 tablet (25 mg total) by mouth 2 (two) times daily.   MULTIPLE VITAMINS PO Take 1 tablet by mouth daily. In the mornings   OAT BRAN SOLUBLE PO Take 1 tablet by mouth daily.   OVER THE COUNTER MEDICATION Take 250 mg by mouth daily. Alphalipoic Acid 250 mg daily   OVER THE COUNTER MEDICATION Ultimate Bone Support - Patient takes 1 by mouth daily.   OVER THE COUNTER MEDICATION Take 66 mg by mouth daily. Vision Gold Lutein   pantoprazole 40 MG tablet Commonly known as: PROTONIX TAKE 1 TABLET DAILY BEFORE BREAKFAST   pyridOXINE 100 MG tablet Commonly known as: VITAMIN B-6 Take 100 mg by mouth daily.   Turmeric 500 MG Caps Take 1 capsule by mouth 3 (three) times daily.   VITAMIN B 12 PO Take 1,000 mcg by mouth daily.   Zinc 50 MG Caps Take 50 mg by mouth daily.       History (reviewed): Past Medical History:  Diagnosis Date  . Anemia   . Arthritis   . Cancer ([place])    skin cancer on nose  . Carpal tunnel  syndrome, bilateral   . Cataract   . Esophagitis   . GERD (gastroesophageal reflux disease)   . H/O hiatal hernia   . Osteopenia   . Scoliosis   . Shingles   . SVT (supraventricular tachycardia) ([place])   . Ulcerative colitis    Past Surgical History:  Procedure Laterality Date  . ABDOMINAL HYSTERECTOMY    . BUNIONECTOMY     Bilateral  . CARPAL TUNNEL RELEASE Left 03/10/2015   Procedure: LEFT CARPAL TUNNEL RELEASE;  Surgeon: Daryll Brod, MD;  Location: [facility];  Service: Orthopedics;  Laterality: Left;  . CARPAL TUNNEL RELEASE Right 10/31/2017   Procedure: RIGHT CARPAL TUNNEL RELEASE;  Surgeon: Daryll Brod, MD;  Location: [facility];  Service: Orthopedics;  Laterality: Right;  . CATARACT EXTRACTION W/PHACO  05/28/2012   Procedure: CATARACT EXTRACTION PHACO AND INTRAOCULAR LENS PLACEMENT (IOC);  Surgeon:  Tonny Branch, MD;  Location: [facility];  Service: Ophthalmology;  Laterality: Right;  CDE=12.84  . CATARACT EXTRACTION W/PHACO  06/07/2012   Procedure: CATARACT EXTRACTION PHACO AND INTRAOCULAR LENS PLACEMENT (IOC);  Surgeon: Tonny Branch, MD;  Location: [facility];  Service: Ophthalmology;  Laterality: Left;  CDE 17.60  . COLONOSCOPY    . COLONOSCOPY N/A 07/08/2015   Procedure: COLONOSCOPY;  Surgeon: Rogene Houston, MD;  [facility];  Service: Endoscopy;  Laterality: N/A;  930  . ESOPHAGEAL DILATION N/A 07/08/2015   Procedure: ESOPHAGEAL DILATION;  Surgeon: Rogene Houston, MD;  [facility];  Service: Endoscopy;  Laterality: N/A;  . ESOPHAGOGASTRODUODENOSCOPY N/A 07/08/2015   Procedure: ESOPHAGOGASTRODUODENOSCOPY (EGD);  Surgeon: Rogene Houston, MD;  [facility];  Service: Endoscopy;  Laterality: N/A;  . Hernia     Right inguinal  . HERNIA REPAIR  1961   right inguinal hernia  . UPPER GASTROINTESTINAL ENDOSCOPY     Family History  Problem Relation Age of Onset  . Arthritis Mother   . Heart disease Mother   . Cancer Father         pancreat  . Pancreatic cancer Father   . Arthritis Father        back issues   . Cancer Brother        lung and liver  . Liver cancer Brother   . Lung cancer Brother   . Heart disease Sister        Rheumatic Fever  . Peptic Ulcer Sister   . Diabetes Sister   . Colon cancer Sister   . Edema Sister   . GI problems Sister        diverticulitis  . Diabetes Sister   . Neuropathy Sister   . COPD Sister   . Hiatal hernia Sister   . GI problems Sister    Social History   Socioeconomic History  . Marital status: Widowed    Spouse name: Not on file  . Number of children: 3  . Years of education: Not on file  . Highest education level: Bachelor's degree (e.g., BA, AB, BS)  Occupational History    Employer: RETIRED    Comment: accounting  Tobacco Use  . Smoking status: Never Smoker  . Smokeless tobacco: Never Used  . Tobacco comment: Never smoker  Vaping Use  . Vaping Use: Never used  Substance and Sexual Activity  . Alcohol use: Yes    Alcohol/week: 0.0 standard drinks    Comment: Very Rarely  . Drug use: No  . Sexual activity: Not Currently    Birth control/protection: Surgical  Other Topics Concern  . Not on file  Social History Narrative   Lives alone.   Social Determinants of Health   Financial Resource Strain: Not on file  Food Insecurity: Not on file  Transportation Needs: Not on file  Physical Activity: Not on file  Stress: Not on file  Social Connections: Not on file    Activities of Daily Living In your present state of health, do you have any difficulty performing the following activities: 06/15/2020  Hearing? N  Vision? N  Comment Wears reading glasses  Difficulty concentrating or making decisions? N  Walking or climbing stairs? N  Dressing or bathing? N  Doing errands, shopping? N  Preparing Food and eating ? N  Using the Toilet? N  In the past six months, have you accidently leaked urine? N  Do you have problems with loss of  bowel control? N   Managing your Medications? N  Managing your Finances? N  Housekeeping or managing your Housekeeping? N  Some recent data might be hidden    Patient Education/ Literacy    Exercise Current Exercise Habits: Home exercise routine, Type of exercise: stretching, Time (Minutes): 30, Frequency (Times/Week): 5, Weekly Exercise (Minutes/Week): 150  Diet Patient reports consuming 2 meals a day and 2 snack(s) a day Patient reports that her primary diet is: Regular Patient reports that she does have regular access to food.   Depression Screen PHQ 2/9 Scores 06/15/2020 05/05/2020 11/01/2019 06/13/2019 04/24/2019 03/14/2018 11/21/2017  PHQ - 2 Score 1 1 0 0 0 1 0  PHQ- 9 Score 2 2 0 - - - -     Fall Risk Fall Risk  06/15/2020 05/05/2020 11/01/2019 06/13/2019 03/14/2018  Falls in the past year? 0 0 0 0 0  Number falls in past yr: - - - 0 -  Injury with Fall? - - - 0 -  Risk for fall due to : - - - No Fall Risks -  Follow up - - - Falls evaluation completed -     Objective:  Jolayne Haines Taneil Lazarus seemed alert and oriented and she participated appropriately during our telephone visit.  Blood Pressure Weight BMI  BP Readings from Last 3 Encounters:  05/29/20 128/79  05/05/20 138/83  12/10/19 (!) 157/82   Wt Readings from Last 3 Encounters:  05/05/20 133 lb (60.3 kg)  12/10/19 135 lb 3.2 oz (61.3 kg)  11/01/19 134 lb (60.8 kg)   BMI Readings from Last 1 Encounters:  05/05/20 24.33 kg/m    *Unable to obtain current vital signs, weight, and BMI due to telephone visit type  Hearing/Vision  . Lorell did not seem to have difficulty with hearing/understanding during the telephone conversation . Reports that she has not had a formal eye exam by an eye care professional within the past year . Reports that she has not had a formal hearing evaluation within the past year *Unable to fully assess hearing and vision during telephone visit type  Cognitive Function: 6CIT Screen 06/15/2020 06/13/2019  What  Year? 0 points 0 points  What month? 0 points 0 points  What time? 0 points 0 points  Count back from 20 0 points 0 points  Months in reverse 0 points 0 points  Repeat phrase 2 points 0 points  Total Score 2 0   (Normal:0-7, Significant for Dysfunction: >8)  Normal Cognitive Function Screening: Yes   Immunization & Health Maintenance Record Immunization History  Administered Date(s) Administered  . Pneumococcal Conjugate-13 02/26/2014  . Pneumococcal Polysaccharide-23 03/20/2012  . Td 01/23/2015  . Tdap 01/23/2015    Health Maintenance  Topic Date Due  . MAMMOGRAM  02/13/2020  . DEXA SCAN  05/22/2021  . TETANUS/TDAP  01/22/2025  . PNA vac Low Risk Adult  Completed  . INFLUENZA VACCINE  Discontinued  . COVID-19 Vaccine  Discontinued       Assessment  This is a routine wellness examination for Lori Soto.  Health Maintenance: Due or Overdue Health Maintenance Due  Topic Date Due  . MAMMOGRAM  02/13/2020    Lori Soto does not need a referral for Community Assistance: Care Management:   no Social Work:    no Prescription Assistance:  no Nutrition/Diabetes Education:  no   Plan:  Personalized Goals Goals Addressed            This Visit's Progress   .  awv       06/15/2020 AWV Goal: Fall Prevention  . Over the next year, patient will decrease their risk for falls by: o Using assistive devices, such as a cane or walker, as needed o Identifying fall risks within their home and correcting them by: - Removing throw rugs - Adding handrails to stairs or ramps - Removing clutter and keeping a clear pathway throughout the home - Increasing light, especially at night - Adding shower handles/bars - Raising toilet seat o Identifying potential personal risk factors for falls: - Medication side effects - Incontinence/urgency - Vestibular dysfunction - Hearing loss - Musculoskeletal disorders - Neurological disorders - Orthostatic hypotension         Personalized Health Maintenance & Screening Recommendations  Screening mammography  Lung Cancer Screening Recommended: no (Low Dose CT Chest recommended if Age 56-80 years, 30 pack-year currently smoking OR have quit w/in past 15 years) Hepatitis C Screening recommended: no HIV Screening recommended: no  Advanced Directives: Written information was not prepared per patient's request.  Referrals & Orders No orders of the defined types were placed in this encounter.   Follow-up Plan . Follow-up with [name], DO as planned . Schedule your yearly eye exam . Schedule your yearly mammogram . AVS printed and mailed to patient    I have personally reviewed and noted the following in the patient's chart:   . Medical and social history . Use of alcohol, tobacco or illicit drugs  . Current medications and supplements . Functional ability and status . Nutritional status . Physical activity . Advanced directives . List of other physicians . Hospitalizations, surgeries, and ER visits in previous 12 months . Vitals . Screenings to include cognitive, depression, and falls . Referrals and appointments  In addition, I have reviewed and discussed with Lori Soto certain preventive protocols, quality metrics, and best practice recommendations. A written personalized care plan for preventive services as well as general preventive health recommendations is available and can be mailed to the patient at her request.     Lynnea Ferrier, LPN  11/02/5007

## 2020-06-16 ENCOUNTER — Ambulatory Visit (INDEPENDENT_AMBULATORY_CARE_PROVIDER_SITE_OTHER): Payer: Medicare Other | Admitting: Internal Medicine

## 2020-06-16 ENCOUNTER — Other Ambulatory Visit: Payer: Self-pay

## 2020-06-16 ENCOUNTER — Encounter (INDEPENDENT_AMBULATORY_CARE_PROVIDER_SITE_OTHER): Payer: Self-pay | Admitting: Internal Medicine

## 2020-06-16 VITALS — BP 121/78 | HR 64 | Temp 97.4°F | Ht 62.0 in | Wt 128.0 lb

## 2020-06-16 DIAGNOSIS — K5100 Ulcerative (chronic) pancolitis without complications: Secondary | ICD-10-CM

## 2020-06-16 DIAGNOSIS — K2100 Gastro-esophageal reflux disease with esophagitis, without bleeding: Secondary | ICD-10-CM | POA: Diagnosis not present

## 2020-06-16 MED ORDER — PANTOPRAZOLE SODIUM 40 MG PO TBEC: 40.0000 mg | DELAYED_RELEASE_TABLET | ORAL | 3 refills | Status: DC

## 2020-06-16 NOTE — Progress Notes (Signed)
Presenting complaint;  Follow-up for ulcerative colitis and GERD.  Database and subjective:  Patient is 85 year old Caucasian female who has chronic ulcerative colitis maintained on mesalamine who also has a history of chronic GERD complicated by distal esophageal stricture was dilated to 18 mm back in March 2017.  She was last seen 6 months ago. She states she is doing well with low-dose mesalamine.  She is not having any side effects with it.  She is taking pantoprazole every other day and she is not having any breakthrough symptoms.  She has occasional dysphagia but she has not had any episode of food impaction.  She denies  hoarseness sore throat or chronic cough.  She also denies melena or rectal bleeding.  On most days she has 1 formed stool.  Occasionally she may have a second stool. She has occasional pain in right lower quadrant which has not gotten worse.  This pain is mild and not triggered with meals or bowel movements. Patient was diagnosed with Covid on 05/07/2020.  She states she had mild symptoms.  She tested -14 days later.  Patient said that she has allergy to flu vaccine and therefore she was advised not to take Covid vaccine.  Current Medications: Outpatient Encounter Medications as of 06/16/2020  Medication Sig  . acetaminophen (TYLENOL) 650 MG CR tablet Take 650 mg by mouth every 8 (eight) hours as needed. Patient states that she takes prn for arthritis in her knees.  . Bromelains 500 MG TABS Take 500 mg by mouth daily.  [name] 500 MG CAPS Take 1 capsule by mouth daily.   . Coenzyme Q10 (CO Q-10 PO) Take 1 tablet by mouth daily. With red yeast rice [employer] Brand  . COLLAGEN PO Take 1 tablet by mouth daily. This is known as 1-2-3  . Cyanocobalamin (VITAMIN B 12 PO) Take 1,000 mcg by mouth daily.  . Ginkgo Biloba 40 MG TABS Take 40 mg by mouth daily.  [name] glucosamine-chondroitin 500-400 MG tablet Take 1 tablet by mouth 2 (two) times daily.  [name] Hyaluronic Acid-Vitamin C  (HYALURONIC ACID PO) Take 80 mg by mouth daily.  . Mesalamine (ASACOL) 400 MG CPDR DR capsule TAKE 2 CAPSULES DAILY  . metoprolol tartrate (LOPRESSOR) 25 MG tablet Take 1 tablet (25 mg total) by mouth 2 (two) times daily.  . MULTIPLE VITAMINS PO Take 1 tablet by mouth daily. In the mornings  . Nutritional Supplements (GRAPESEED EXTRACT PO) Take 60 mg by mouth daily.   [name] OAT BRAN SOLUBLE PO Take 1 tablet by mouth daily.  [name] OVER THE COUNTER MEDICATION Take 66 mg by mouth daily. Vision Gold Lutein  . OVER THE COUNTER MEDICATION Take 250 mg by mouth daily. Alphalipoic Acid 250 mg daily  . OVER THE COUNTER MEDICATION Ultimate Bone Support - Patient takes 1 by mouth daily.  . pantoprazole (PROTONIX) 40 MG tablet TAKE 1 TABLET DAILY BEFORE BREAKFAST  . pyridOXINE (VITAMIN B-6) 100 MG tablet Take 100 mg by mouth daily.  . Turmeric 500 MG CAPS Take 1 capsule by mouth 3 (three) times daily.   . Vitamins C E (CRANBERRY CONCENTRATE PO) Take 2,500 mg by mouth daily.  . Zinc 50 MG CAPS Take 50 mg by mouth daily.  . [DISCONTINUED] benzonatate (TESSALON PERLES) 100 MG capsule Take 1 capsule (100 mg total) by mouth 3 (three) times daily as needed. (Patient not taking: Reported on 06/15/2020)   No facility-administered encounter medications on file as of 06/16/2020.  Objective: Blood pressure 121/78, pulse 64, temperature (!) 97.4 F (36.3 C), temperature source Oral, height 5' 2"  (1.575 m), weight 128 lb (58.1 kg). Patient is alert and in no acute distress. Patient is wearing a mask. Conjunctiva is pink. Sclera is nonicteric Oropharyngeal mucosa is normal. No neck masses or thyromegaly noted. Cardiac exam with regular rhythm normal S1 and S2. No murmur or gallop noted. Lungs are clear to auscultation. Abdomen is soft and nontender with organomegaly or masses. No LE edema or clubbing noted.  Labs/studies Results:  CBC Latest Ref Rng & Units 10/29/2019 05/23/2019 10/23/2018  WBC 3.4 - 10.8 x10E3/uL  5.3 5.5 5.8  Hemoglobin 11.1 - 15.9 g/dL 13.9 13.6 13.7  Hematocrit 34.0 - 46.6 % 41.7 39.8 40.1  Platelets 150 - 450 x10E3/uL 233 237 256    CMP Latest Ref Rng & Units 04/29/2020 10/29/2019 05/17/2019  Glucose 65 - 99 mg/dL 92 91 87  BUN 8 - 27 mg/dL 10 10 9   Creatinine 0.57 - 1.00 mg/dL 0.85 0.91 0.78  Sodium 134 - 144 mmol/L 141 143 144  Potassium 3.5 - 5.2 mmol/L 5.3(H) 4.9 4.6  Chloride 96 - 106 mmol/L 101 102 104  CO2 20 - 29 mmol/L 26 27 25   Calcium 8.7 - 10.3 mg/dL 10.7(H) 9.8 9.9  Total Protein 6.0 - 8.5 g/dL 7.0 6.9 6.9  Total Bilirubin 0.0 - 1.2 mg/dL 0.6 0.6 0.5  Alkaline Phos 44 - 121 IU/L 43(L) 44(L) 45  AST 0 - 40 IU/L 21 18 21   ALT 0 - 32 IU/L 13 15 16     Hepatic Function Latest Ref Rng & Units 04/29/2020 10/29/2019 05/17/2019  Total Protein 6.0 - 8.5 g/dL 7.0 6.9 6.9  Albumin 3.6 - 4.6 g/dL 4.3 3.9 4.3  AST 0 - 40 IU/L 21 18 21   ALT 0 - 32 IU/L 13 15 16   Alk Phosphatase 44 - 121 IU/L 43(L) 44(L) 45  Total Bilirubin 0.0 - 1.2 mg/dL 0.6 0.6 0.5  Bilirubin, Direct 0.00 - 0.40 mg/dL - - -    Recent blood work reviewed with patient.  Assessment:  #1.  Chronic ulcerative colitis.  She has history of pan ulcerative colitis.  She has remained in remission and mesalamine dose has been gradually decreased.  I recommended that he should not be discontinued as her disease may relapse and may be difficult to control.  Last surveillance colonoscopy was in March 2017.  If she is interested in surveillance colonoscopy would consider one after next visit..  #2.  History of erosive reflux esophagitis complicated by esophageal stricture status post esophageal dilation in March 2017.  Heartburn is well controlled with dietary measures and PPI every other day.  She is having sporadic dysphagia.  If dysphagia worsens would consider esophageal dilation.  Plan:  Prescription for pantoprazole 40 mg every other morning.  45 doses with 3 refills. Prescription also given for mesalamine 800  mg p.o. daily 40-monthsupply with 3 refills. Patient will call if dysphagia worsens or if she has rectal bleeding. Office visit in 6 months

## 2020-06-16 NOTE — Patient Instructions (Signed)
Please call office if you Pantoprazole stops working

## 2020-06-24 ENCOUNTER — Other Ambulatory Visit (INDEPENDENT_AMBULATORY_CARE_PROVIDER_SITE_OTHER): Payer: Self-pay | Admitting: Internal Medicine

## 2020-06-30 ENCOUNTER — Encounter (INDEPENDENT_AMBULATORY_CARE_PROVIDER_SITE_OTHER): Payer: Self-pay | Admitting: *Deleted

## 2020-07-03 ENCOUNTER — Ambulatory Visit (INDEPENDENT_AMBULATORY_CARE_PROVIDER_SITE_OTHER): Payer: Medicare Other | Admitting: Family Medicine

## 2020-07-03 ENCOUNTER — Other Ambulatory Visit: Payer: Self-pay

## 2020-07-03 ENCOUNTER — Ambulatory Visit (INDEPENDENT_AMBULATORY_CARE_PROVIDER_SITE_OTHER): Payer: Medicare Other

## 2020-07-03 ENCOUNTER — Encounter: Payer: Self-pay | Admitting: Family Medicine

## 2020-07-03 VITALS — BP 135/80 | HR 63 | Temp 97.9°F | Ht 60.0 in | Wt 129.0 lb

## 2020-07-03 DIAGNOSIS — F4321 Adjustment disorder with depressed mood: Secondary | ICD-10-CM | POA: Diagnosis not present

## 2020-07-03 DIAGNOSIS — K449 Diaphragmatic hernia without obstruction or gangrene: Secondary | ICD-10-CM | POA: Diagnosis not present

## 2020-07-03 DIAGNOSIS — M85852 Other specified disorders of bone density and structure, left thigh: Secondary | ICD-10-CM | POA: Diagnosis not present

## 2020-07-03 DIAGNOSIS — M8588 Other specified disorders of bone density and structure, other site: Secondary | ICD-10-CM | POA: Diagnosis not present

## 2020-07-03 DIAGNOSIS — J189 Pneumonia, unspecified organism: Secondary | ICD-10-CM

## 2020-07-03 DIAGNOSIS — M419 Scoliosis, unspecified: Secondary | ICD-10-CM | POA: Diagnosis not present

## 2020-07-03 DIAGNOSIS — K5100 Ulcerative (chronic) pancolitis without complications: Secondary | ICD-10-CM

## 2020-07-03 DIAGNOSIS — Z780 Asymptomatic menopausal state: Secondary | ICD-10-CM | POA: Diagnosis not present

## 2020-07-03 NOTE — Progress Notes (Signed)
Subjective: CC: PNA f/u PCP: [name], DO DVV:OHYWVP Dennis Killilea is a 85 y.o. female presenting to clinic today for:  1. PNA Patient noted to have bilateral pulmonary infiltrates last month. Treated with ZPak. Here for follow up CXR.  She has not been having any issues except for mild fatigue following her infection.  Work of breathing has been normal.  No dyspnea on exertion, wheezes, hemoptysis or cough  2.  Osteopenia Patient noted to have osteopenia on DEXA scan in 2021.  There was a decent drop in her bone density from her previous and she would really like to get this repeated.  She does have history of chronic ulcerative colitis.  She has been intermittently using calcium.  She is compliant with vitamin D and tries to stay physically active  3.  Grieving Patient reports that she is lost 3 family members in the last week.  She is been quite tearful about this but is trying to stay strong.  No SI or HI  ROS: Per HPI  Allergies  Allergen Reactions  . Penicillins Itching and Swelling    Patient states that she had a rash also Has patient had a PCN reaction causing immediate rash, facial/tongue/throat swelling, SOB or lightheadedness with hypotension: No Has patient had a PCN reaction causing severe rash involving mucus membranes or skin necrosis: No Has patient had a PCN reaction that required hospitalization No Has patient had a PCN reaction occurring within the last 10 years: No If all of the above answers are "NO", then may proceed with Cephalosporin use.   . Tape Itching  . Influenza Virus Vacc Split Pf Rash    Per Patient she was told by her PCP not to ever take this injection again   Past Medical History:  Diagnosis Date  . Anemia   . Arthritis   . Cancer ([place])    skin cancer on nose  . Carpal tunnel syndrome, bilateral   . Cataract   . Esophagitis   . GERD (gastroesophageal reflux disease)   . H/O hiatal hernia   . Osteopenia   . Scoliosis   .  Shingles   . SVT (supraventricular tachycardia) ([place])   . Ulcerative colitis     Current Outpatient Medications:  .  acetaminophen (TYLENOL) 650 MG CR tablet, Take 650 mg by mouth every 8 (eight) hours as needed. Patient states that she takes prn for arthritis in her knees., Disp: , Rfl:  .  Bromelains 500 MG TABS, Take 500 mg by mouth daily., Disp: , Rfl:  .  Cayenne 500 MG CAPS, Take 1 capsule by mouth daily. , Disp: , Rfl:  .  Coenzyme Q10 (CO Q-10 PO), Take 1 tablet by mouth daily. With red yeast rice [employer], Disp: , Rfl:  .  COLLAGEN PO, Take 1 tablet by mouth daily. This is known as 1-2-3, Disp: , Rfl:  .  Cyanocobalamin (VITAMIN B 12 PO), Take 1,000 mcg by mouth daily., Disp: , Rfl:  .  Ginkgo Biloba 40 MG TABS, Take 40 mg by mouth daily., Disp: , Rfl:  .  glucosamine-chondroitin 500-400 MG tablet, Take 1 tablet by mouth 2 (two) times daily., Disp: , Rfl:  .  Hyaluronic Acid-Vitamin C (HYALURONIC ACID PO), Take 80 mg by mouth daily., Disp: , Rfl:  .  Mesalamine (ASACOL) 400 MG CPDR DR capsule, TAKE 2 CAPSULES DAILY, Disp: 180 capsule, Rfl: 3 .  metoprolol tartrate (LOPRESSOR) 25 MG tablet, Take 1 tablet (25  mg total) by mouth 2 (two) times daily., Disp: 180 tablet, Rfl: 3 .  MULTIPLE VITAMINS PO, Take 1 tablet by mouth daily. In the mornings, Disp: , Rfl:  .  Nutritional Supplements (GRAPESEED EXTRACT PO), Take 60 mg by mouth daily. , Disp: , Rfl:  .  OAT BRAN SOLUBLE PO, Take 1 tablet by mouth daily., Disp: , Rfl:  .  OVER THE COUNTER MEDICATION, Take 66 mg by mouth daily. Vision Gold Lutein, Disp: , Rfl:  .  OVER THE COUNTER MEDICATION, Take 250 mg by mouth daily. Alphalipoic Acid 250 mg daily, Disp: , Rfl:  .  OVER THE COUNTER MEDICATION, Ultimate Bone Support - Patient takes 1 by mouth daily., Disp: , Rfl:  .  pantoprazole (PROTONIX) 40 MG tablet, TAKE 1 TABLET DAILY BEFORE BREAKFAST, Disp: 90 tablet, Rfl: 3 .  pyridOXINE (VITAMIN B-6) 100 MG tablet, Take 100 mg by  mouth daily., Disp: , Rfl:  .  Turmeric 500 MG CAPS, Take 1 capsule by mouth 3 (three) times daily. , Disp: , Rfl:  .  Vitamins C E (CRANBERRY CONCENTRATE PO), Take 2,500 mg by mouth daily., Disp: , Rfl:  .  Zinc 50 MG CAPS, Take 50 mg by mouth daily., Disp: , Rfl:  Social History   Socioeconomic History  . Marital status: Widowed    Spouse name: Not on file  . Number of children: 3  . Years of education: Not on file  . Highest education level: Bachelor's degree (e.g., BA, AB, BS)  Occupational History    Employer: RETIRED    Comment: accounting  Tobacco Use  . Smoking status: Never Smoker  . Smokeless tobacco: Never Used  . Tobacco comment: Never smoker  Vaping Use  . Vaping Use: Never used  Substance and Sexual Activity  . Alcohol use: Yes    Alcohol/week: 0.0 standard drinks    Comment: Very Rarely  . Drug use: No  . Sexual activity: Not Currently    Birth control/protection: Surgical  Other Topics Concern  . Not on file  Social History Narrative   Lives alone.   Social Determinants of Health   Financial Resource Strain: Not on file  Food Insecurity: Not on file  Transportation Needs: Not on file  Physical Activity: Not on file  Stress: Not on file  Social Connections: Not on file  Intimate Partner Violence: Not on file   Family History  Problem Relation Age of Onset  . Arthritis Mother   . Heart disease Mother   . Cancer Father        pancreat  . Pancreatic cancer Father   . Arthritis Father        back issues   . Cancer Brother        lung and liver  . Liver cancer Brother   . Lung cancer Brother   . Heart disease Sister        Rheumatic Fever  . Peptic Ulcer Sister   . Diabetes Sister   . Colon cancer Sister   . Edema Sister   . GI problems Sister        diverticulitis  . Diabetes Sister   . Neuropathy Sister   . COPD Sister   . Hiatal hernia Sister   . GI problems Sister     Objective: Office vital signs reviewed. BP 135/80   Pulse 63    Temp 97.9 F (36.6 C) (Temporal)   Ht 5' (1.524 m)   Wt 129 lb (58.5  kg)   SpO2 95%   BMI 25.19 kg/m   Physical Examination:  General: Awake, alert, well nourished, No acute distress HEENT: Normal; sclera white Cardio: regular rate and rhythm, S1S2 heard, no murmurs appreciated Pulm: clear to auscultation bilaterally, no wheezes, rhonchi or rales; normal work of breathing on room air MSK: Scoliotic curve noted in the thoracic spine Psych: Mood mildly depressed but patient is pleasant and interactive  Depression screen [facility] 2/9 07/03/2020 06/15/2020 05/05/2020  Decreased Interest 0 0 0  Down, Depressed, Hopeless 0 1 1  PHQ - 2 Score 0 1 1  Altered sleeping 0 0 0  Tired, decreased energy 0 0 0  Change in appetite 0 0 0  Feeling bad or failure about yourself  0 0 0  Trouble concentrating 0 1 1  Moving slowly or fidgety/restless 0 0 0  Suicidal thoughts 0 0 0  PHQ-9 Score 0 2 2  Difficult doing work/chores - - -  Some recent data might be hidden   Assessment/ Plan: 85 y.o. female   Pneumonia of both lower lobes due to infectious organism - Plan: DG Chest 2 View  Scoliosis of thoracic spine, unspecified scoliosis type - Plan: DG [facility] DEXA  Osteopenia of neck of left femur - Plan: DG [facility] DEXA  Ulcerative pancolitis without complication ([place]) - Plan: DG [facility] DEXA  Grief  Chest x-ray without evidence of ongoing infection  Repeat DEXA scan performed.  Given history of UC I think that she does have enough risk to repeat.  Last T score was borderline at -2.4  She is grieving and appropriately so.  I would like her to reach out to me if symptoms become worse or she needs additional support.  No orders of the defined types were placed in this encounter.  No orders of the defined types were placed in this encounter.    [name], DO [place] [phone]

## 2020-07-09 DIAGNOSIS — Z124 Encounter for screening for malignant neoplasm of cervix: Secondary | ICD-10-CM | POA: Diagnosis not present

## 2020-07-09 DIAGNOSIS — Z01419 Encounter for gynecological examination (general) (routine) without abnormal findings: Secondary | ICD-10-CM | POA: Diagnosis not present

## 2020-07-13 DIAGNOSIS — H35363 Drusen (degenerative) of macula, bilateral: Secondary | ICD-10-CM | POA: Diagnosis not present

## 2020-07-21 DIAGNOSIS — Z1231 Encounter for screening mammogram for malignant neoplasm of breast: Secondary | ICD-10-CM | POA: Diagnosis not present

## 2020-07-28 NOTE — Progress Notes (Signed)
Cardiology Office Note   Date:  07/29/2020   ID:  Lori Soto, DOB 12-22-1935, MRN [id number]  PCP:  [name], DO  Cardiologist:   Minus Breeding, MD   Chief Complaint  Patient presents with  . Palpitations      History of Present Illness: Lori Soto is a 85 y.o. female who presents for evaluation of palpitations.   I saw her for evaluation of palpitations in 2014.  She has a heart murmur and an echo in 2011 was normal.  She was in in theED in August.2018but there was no recorded arrhythmia. She had palpitations but these were infrequent so no further work up was suggested.   She was in the ED in April 2020 with SVT. She had mildly elevated troponin. She had an SVT with a rate of 174She was treated with adenosine. After the second visit she wasgiven a prescription for metoprolol 25 mg twice daily but she did not want to take this until checking with [place]. I talked to her about titrating the beta blocker and the possibility of ablation.  She wanted to try medications.   Since I last saw her she has had no further episodes.  She does not feel any tachypalpitations.  She has some episodes of hot flashes but she is checked her heart rate and its been regular rhythm not particularly fast.  She has not had any presyncope or syncope.  She is going to the gym 3 times per week.   Past Medical History:  Diagnosis Date  . Anemia   . Arthritis   . Cancer ([place])    skin cancer on nose  . Carpal tunnel syndrome, bilateral   . Cataract   . Esophagitis   . GERD (gastroesophageal reflux disease)   . H/O hiatal hernia   . Osteopenia   . Scoliosis   . Shingles   . SVT (supraventricular tachycardia) ([place])   . Ulcerative colitis     Past Surgical History:  Procedure Laterality Date  . ABDOMINAL HYSTERECTOMY    . BUNIONECTOMY     Bilateral  . CARPAL TUNNEL RELEASE Left 03/10/2015   Procedure: LEFT CARPAL TUNNEL RELEASE;  Surgeon: Daryll Brod, MD;  Location:  [facility];  Service: Orthopedics;  Laterality: Left;  . CARPAL TUNNEL RELEASE Right 10/31/2017   Procedure: RIGHT CARPAL TUNNEL RELEASE;  Surgeon: Daryll Brod, MD;  Location: [facility];  Service: Orthopedics;  Laterality: Right;  . CATARACT EXTRACTION W/PHACO  05/28/2012   Procedure: CATARACT EXTRACTION PHACO AND INTRAOCULAR LENS PLACEMENT (IOC);  Surgeon: Tonny Branch, MD;  Location: [facility];  Service: Ophthalmology;  Laterality: Right;  CDE=12.84  . CATARACT EXTRACTION W/PHACO  06/07/2012   Procedure: CATARACT EXTRACTION PHACO AND INTRAOCULAR LENS PLACEMENT (IOC);  Surgeon: Tonny Branch, MD;  Location: [facility];  Service: Ophthalmology;  Laterality: Left;  CDE 17.60  . COLONOSCOPY    . COLONOSCOPY N/A 07/08/2015   Procedure: COLONOSCOPY;  Surgeon: Rogene Houston, MD;  [facility];  Service: Endoscopy;  Laterality: N/A;  930  . ESOPHAGEAL DILATION N/A 07/08/2015   Procedure: ESOPHAGEAL DILATION;  Surgeon: Rogene Houston, MD;  [facility];  Service: Endoscopy;  Laterality: N/A;  . ESOPHAGOGASTRODUODENOSCOPY N/A 07/08/2015   Procedure: ESOPHAGOGASTRODUODENOSCOPY (EGD);  Surgeon: Rogene Houston, MD;  [facility];  Service: Endoscopy;  Laterality: N/A;  . Hernia     Right inguinal  . [place]  right inguinal hernia  . UPPER GASTROINTESTINAL ENDOSCOPY       Current Outpatient Medications  Medication Sig Dispense Refill  . acetaminophen (TYLENOL) 650 MG CR tablet Take 650 mg by mouth every 8 (eight) hours as needed. Patient states that she takes prn for arthritis in her knees.    . Bromelains 500 MG TABS Take 500 mg by mouth daily.    [name] 500 MG CAPS Take 1 capsule by mouth daily.     . Coenzyme Q10 (CO Q-10 PO) Take 1 tablet by mouth daily. With red yeast rice [employer]    . COLLAGEN PO Take 1 tablet by mouth daily. This is known as 1-2-3    . Cyanocobalamin (VITAMIN B 12 PO) Take 1,000 mcg by mouth daily.     . Ginkgo Biloba 40 MG TABS Take 40 mg by mouth daily.    [name] glucosamine-chondroitin 500-400 MG tablet Take 1 tablet by mouth 2 (two) times daily.    [name] Hyaluronic Acid-Vitamin C (HYALURONIC ACID PO) Take 80 mg by mouth daily.    . Mesalamine (ASACOL) 400 MG CPDR DR capsule TAKE 2 CAPSULES DAILY 180 capsule 3  . metoprolol tartrate (LOPRESSOR) 25 MG tablet Take 1 tablet (25 mg total) by mouth 2 (two) times daily. 180 tablet 3  . MULTIPLE VITAMINS PO Take 1 tablet by mouth daily. In the mornings    . Nutritional Supplements (GRAPESEED EXTRACT PO) Take 60 mg by mouth daily.     [name] OAT BRAN SOLUBLE PO Take 1 tablet by mouth daily.    [name] OVER THE COUNTER MEDICATION Take 66 mg by mouth daily. Vision Gold Lutein    . OVER THE COUNTER MEDICATION Take 250 mg by mouth daily. Alphalipoic Acid 250 mg daily    . OVER THE COUNTER MEDICATION Ultimate Bone Support - Patient takes 1 by mouth daily.    . pantoprazole (PROTONIX) 40 MG tablet TAKE 1 TABLET DAILY BEFORE BREAKFAST 90 tablet 3  . pyridOXINE (VITAMIN B-6) 100 MG tablet Take 100 mg by mouth daily.    . Turmeric 500 MG CAPS Take 1 capsule by mouth 3 (three) times daily.     . Vitamins C E (CRANBERRY CONCENTRATE PO) Take 2,500 mg by mouth daily.    . Zinc 50 MG CAPS Take 50 mg by mouth daily.     No current facility-administered medications for this visit.    Allergies:   Penicillins, Tape, and Influenza virus vacc split pf    ROS:  Please see the history of present illness.   Otherwise, review of systems are positive for hiatal hernia.   All other systems are reviewed and negative.    PHYSICAL EXAM: VS:  BP 138/80   Pulse (!) 57   Ht 5' (1.524 m)   Wt 132 lb (59.9 kg)   BMI 25.78 kg/m  , BMI Body mass index is 25.78 kg/m. GENERAL:  Well appearing NECK:  No jugular venous distention, waveform within normal limits, carotid upstroke brisk and symmetric, no bruits, no thyromegaly LUNGS:  Clear to auscultation bilaterally CHEST:   Unremarkable HEART:  PMI not displaced or sustained,S1 and S2 within normal limits, no S3, no S4, no clicks, no rubs, no murmurs ABD:  Flat, positive bowel sounds normal in frequency in pitch, no bruits, no rebound, no guarding, no midline pulsatile mass, no hepatomegaly, no splenomegaly EXT:  2 plus pulses throughout, no edema, no cyanosis no clubbing   EKG:  EKG is  ordered today. Probable ectopic atrial rhythm, axis within normal limits, intervals within normal limits, no acute ST-T wave changes.   Recent Labs: 10/29/2019: Hemoglobin 13.9; Platelets 233; TSH 1.660 04/29/2020: ALT 13; BUN 10; Creatinine, Ser 0.85; Potassium 5.3; Sodium 141    Lipid Panel    Component Value Date/Time   CHOL 196 04/29/2020 1059   TRIG 82 04/29/2020 1059   TRIG 79 12/18/2015 1105   HDL 60 04/29/2020 1059   HDL 65 12/18/2015 1105   CHOLHDL 3.3 04/29/2020 1059   LDLCALC 121 (H) 04/29/2020 1059   LDLCALC 109 (H) 11/29/2013 0822      Wt Readings from Last 3 Encounters:  07/29/20 132 lb (59.9 kg)  07/03/20 129 lb (58.5 kg)  06/16/20 128 lb (58.1 kg)      Other studies Reviewed: Additional studies/ records that were reviewed today include: None. Review of the above records demonstrates:  Please see elsewhere in the note.     ASSESSMENT AND PLAN:  SVT: The patient's had no further bradycardia arrhythmias and wants to continue the beta-blockers.  She will let me know if she has any further tachypalpitations.   HTN: Her blood pressure is controlled.  No change in therapy.     Current medicines are reviewed at length with the patient today.  The patient does not have concerns regarding medicines.  The following changes have been made:  None  Labs/ tests ordered today include:  None  Orders Placed This Encounter  Procedures  . EKG 12-Lead     Disposition:   FU with me as needed      Signed, Minus Breeding, MD  07/29/2020 1:49 PM    [facility]

## 2020-07-29 ENCOUNTER — Other Ambulatory Visit: Payer: Self-pay

## 2020-07-29 ENCOUNTER — Ambulatory Visit (INDEPENDENT_AMBULATORY_CARE_PROVIDER_SITE_OTHER): Payer: Medicare Other | Admitting: Cardiology

## 2020-07-29 ENCOUNTER — Encounter: Payer: Self-pay | Admitting: Cardiology

## 2020-07-29 VITALS — BP 138/80 | HR 57 | Ht 60.0 in | Wt 132.0 lb

## 2020-07-29 DIAGNOSIS — I471 Supraventricular tachycardia: Secondary | ICD-10-CM

## 2020-07-29 DIAGNOSIS — I10 Essential (primary) hypertension: Secondary | ICD-10-CM

## 2020-07-29 NOTE — Patient Instructions (Signed)
Medication Instructions:  The current medical regimen is effective;  continue present plan and medications.  *If you need a refill on your cardiac medications before your next appointment, please call your pharmacy*  Follow-Up: At [facility], you and your health needs are our priority.  As part of our continuing mission to provide you with exceptional heart care, we have created designated Provider Care Teams.  These Care Teams include your primary Cardiologist (physician) and Advanced Practice Providers (APPs -  Physician Assistants and Nurse Practitioners) who all work together to provide you with the care you need, when you need it.  We recommend signing up for the patient portal called "MyChart".  Sign up information is provided on this After Visit Summary.  MyChart is used to connect with patients for Virtual Visits (Telemedicine).  Patients are able to view lab/test results, encounter notes, upcoming appointments, etc.  Non-urgent messages can be sent to your provider as well.   To learn more about what you can do with MyChart, go to [email or web address].    Your next appointment:   Follow up as needed with Dr Percival Spanish.  Thank you for choosing [facility]!!

## 2020-07-30 ENCOUNTER — Encounter: Payer: Self-pay | Admitting: Family Medicine

## 2020-08-24 ENCOUNTER — Other Ambulatory Visit: Payer: Self-pay | Admitting: Cardiology

## 2020-08-26 ENCOUNTER — Telehealth: Payer: Self-pay | Admitting: Cardiology

## 2020-08-26 ENCOUNTER — Other Ambulatory Visit: Payer: Self-pay

## 2020-08-26 MED ORDER — METOPROLOL TARTRATE 25 MG PO TABS: 25.0000 mg | ORAL_TABLET | Freq: Two times a day (BID) | ORAL | 3 refills | Status: DC

## 2020-08-26 NOTE — Telephone Encounter (Signed)
*  STAT* If patient is at the pharmacy, call can be transferred to refill team.   1. Which medications need to be refilled? (please list name of each medication and dose if known) metoprolol tartrate (LOPRESSOR) 25 MG tablet  2. Which pharmacy/location (including street and city if local pharmacy) is medication to be sent to? [facility], [place]  3. Do they need a 30 day or 90 day supply? 90 days supply  Pt states she received a call from [facility] and was told that her meds could not be shipped with only a 30 day supply, she is calling back to request a 90 day supply

## 2020-08-26 NOTE — Telephone Encounter (Signed)
Medication sent to desired pharmacy.

## 2020-08-27 ENCOUNTER — Encounter: Payer: Self-pay | Admitting: *Deleted

## 2020-10-05 DIAGNOSIS — H10013 Acute follicular conjunctivitis, bilateral: Secondary | ICD-10-CM | POA: Diagnosis not present

## 2020-11-04 ENCOUNTER — Ambulatory Visit: Payer: Medicare Other | Admitting: Family Medicine

## 2020-11-09 ENCOUNTER — Ambulatory Visit (INDEPENDENT_AMBULATORY_CARE_PROVIDER_SITE_OTHER): Payer: Medicare Other | Admitting: Family Medicine

## 2020-11-09 ENCOUNTER — Encounter: Payer: Self-pay | Admitting: Family Medicine

## 2020-11-09 ENCOUNTER — Other Ambulatory Visit: Payer: Self-pay

## 2020-11-09 VITALS — BP 121/66 | HR 76 | Temp 97.1°F | Ht 60.0 in | Wt 129.2 lb

## 2020-11-09 DIAGNOSIS — R252 Cramp and spasm: Secondary | ICD-10-CM

## 2020-11-09 NOTE — Progress Notes (Signed)
Subjective: CC: Chronic follow-up PCP: Lori Norlander, DO WOE:HOZYYQ Lori Soto is a 85 y.o. female presenting to clinic today for:  1.  Leg cramping Patient reports that she has been having some leg cramping, particularly in the left leg over the last several days.  She wonders if her electrolytes are off.  Not currently treated with any diuretics.  She feels that she hydrates adequately.  She is been compliant with her metoprolol.  She takes various supplements.   ROS: Per HPI  Allergies  Allergen Reactions   Penicillins Itching and Swelling    Patient states that she had a rash also Has patient had a PCN reaction causing immediate rash, facial/tongue/throat swelling, SOB or lightheadedness with hypotension: No Has patient had a PCN reaction causing severe rash involving mucus membranes or skin necrosis: No Has patient had a PCN reaction that required hospitalization No Has patient had a PCN reaction occurring within the last 10 years: No If all of the above answers are "NO", then may proceed with Cephalosporin use.    Tape Itching   Influenza Virus Vacc Split Pf Rash    Per Patient she was told by her PCP not to ever take this injection again   Past Medical History:  Diagnosis Date   Anemia    Arthritis    Cancer ([place])    skin cancer on nose   Carpal tunnel syndrome, bilateral    Cataract    Esophagitis    GERD (gastroesophageal reflux disease)    H/O hiatal hernia    Osteopenia    Scoliosis    Shingles    SVT (supraventricular tachycardia) ([facility])    Ulcerative colitis     Current Outpatient Medications:    acetaminophen (TYLENOL) 650 MG CR tablet, Take 650 mg by mouth every 8 (eight) hours as needed. Patient states that she takes prn for arthritis in her knees., Disp: , Rfl:    Bromelains 500 MG TABS, Take 500 mg by mouth daily., Disp: , Rfl:    Cayenne 500 MG CAPS, Take 1 capsule by mouth daily. , Disp: , Rfl:    Coenzyme Q10 (CO Q-10 PO), Take 1 tablet by  mouth daily. With red yeast rice [employer], Disp: , Rfl:    COLLAGEN PO, Take 1 tablet by mouth daily. This is known as 1-2-3, Disp: , Rfl:    Cyanocobalamin (VITAMIN B 12 PO), Take 1,000 mcg by mouth daily., Disp: , Rfl:    Ginkgo Biloba 40 MG TABS, Take 40 mg by mouth daily., Disp: , Rfl:    glucosamine-chondroitin 500-400 MG tablet, Take 1 tablet by mouth 2 (two) times daily., Disp: , Rfl:    Hyaluronic Acid-Vitamin C (HYALURONIC ACID PO), Take 80 mg by mouth daily., Disp: , Rfl:    Mesalamine (ASACOL) 400 MG CPDR DR capsule, TAKE 2 CAPSULES DAILY, Disp: 180 capsule, Rfl: 3   metoprolol tartrate (LOPRESSOR) 25 MG tablet, Take 1 tablet (25 mg total) by mouth 2 (two) times daily., Disp: 180 tablet, Rfl: 3   MULTIPLE VITAMINS PO, Take 1 tablet by mouth daily. In the mornings, Disp: , Rfl:    Nutritional Supplements (GRAPESEED EXTRACT PO), Take 60 mg by mouth daily. , Disp: , Rfl:    OAT BRAN SOLUBLE PO, Take 1 tablet by mouth daily., Disp: , Rfl:    OVER THE COUNTER MEDICATION, Take 66 mg by mouth daily. Vision Gold Lutein, Disp: , Rfl:    OVER THE COUNTER MEDICATION, Take  250 mg by mouth daily. Alphalipoic Acid 250 mg daily, Disp: , Rfl:    OVER THE COUNTER MEDICATION, Ultimate Bone Support - Patient takes 1 by mouth daily., Disp: , Rfl:    pantoprazole (PROTONIX) 40 MG tablet, TAKE 1 TABLET DAILY BEFORE BREAKFAST, Disp: 90 tablet, Rfl: 3   pyridOXINE (VITAMIN B-6) 100 MG tablet, Take 100 mg by mouth daily., Disp: , Rfl:    Turmeric 500 MG CAPS, Take 1 capsule by mouth 3 (three) times daily. , Disp: , Rfl:    Vitamins C E (CRANBERRY CONCENTRATE PO), Take 2,500 mg by mouth daily., Disp: , Rfl:    Zinc 50 MG CAPS, Take 50 mg by mouth daily., Disp: , Rfl:  Social History   Socioeconomic History   Marital status: Widowed    Spouse name: Not on file   Number of children: 3   Years of education: Not on file   Highest education level: Bachelor's degree (e.g., BA, AB, BS)  Occupational  History    Employer: RETIRED    Comment: accounting  Tobacco Use   Smoking status: Never   Smokeless tobacco: Never   Tobacco comments:    Never smoker  Vaping Use   Vaping Use: Never used  Substance and Sexual Activity   Alcohol use: Yes    Alcohol/week: 0.0 standard drinks    Comment: Very Rarely   Drug use: No   Sexual activity: Not Currently    Birth control/protection: Surgical  Other Topics Concern   Not on file  Social History Narrative   Lives alone.   Social Determinants of Health   Financial Resource Strain: Not on file  Food Insecurity: Not on file  Transportation Needs: Not on file  Physical Activity: Not on file  Stress: Not on file  Social Connections: Not on file  Intimate Partner Violence: Not on file   Family History  Problem Relation Age of Onset   Arthritis Mother    Heart disease Mother    Cancer Father        pancreat   Pancreatic cancer Father    Arthritis Father        back issues    Cancer Brother        lung and liver   Liver cancer Brother    Lung cancer Brother    Heart disease Sister        Rheumatic Fever   Peptic Ulcer Sister    Diabetes Sister    Colon cancer Sister    Edema Sister    GI problems Sister        diverticulitis   Diabetes Sister    Neuropathy Sister    COPD Sister    Hiatal hernia Sister    GI problems Sister     Objective: Office vital signs reviewed. BP 121/66   Pulse 76   Temp (!) 97.1 F (36.2 C)   Ht 5' (1.524 m)   Wt 129 lb 3.2 oz (58.6 kg)   SpO2 100%   BMI 25.23 kg/m   Physical Examination:  General: Awake, alert, well nourished, No acute distress HEENT: Normal; sclera white Cardio: regular rate and rhythm, S1S2 heard, no murmurs appreciated Pulm: clear to auscultation bilaterally, no wheezes, rhonchi or rales; normal work of breathing on room air MSK: No gross swelling noted of lower extremities.  Her tone is normal  Assessment/ Plan: 85 y.o. female   Leg cramping - Plan: Basic  Metabolic Panel, Magnesium  Check BMP, magnesium level.  Stretching is recommended.  No orders of the defined types were placed in this encounter.  No orders of the defined types were placed in this encounter.    Lori Norlander, DO [place] [phone]

## 2020-11-09 NOTE — Patient Instructions (Signed)

## 2020-11-10 LAB — BASIC METABOLIC PANEL
BUN/Creatinine Ratio: 13 (ref 12–28)
BUN: 10 mg/dL (ref 8–27)
CO2: 27 mmol/L (ref 20–29)
Calcium: 9.7 mg/dL (ref 8.7–10.3)
Chloride: 101 mmol/L (ref 96–106)
Creatinine, Ser: 0.8 mg/dL (ref 0.57–1.00)
Glucose: 93 mg/dL (ref 65–99)
Potassium: 4.5 mmol/L (ref 3.5–5.2)
Sodium: 141 mmol/L (ref 134–144)
eGFR: 74 mL/min/{1.73_m2} (ref >59)

## 2020-11-10 LAB — MAGNESIUM: Magnesium: 1.8 mg/dL (ref 1.6–2.3)

## 2020-12-15 ENCOUNTER — Ambulatory Visit (INDEPENDENT_AMBULATORY_CARE_PROVIDER_SITE_OTHER): Payer: Medicare Other | Admitting: Internal Medicine

## 2020-12-17 ENCOUNTER — Encounter (HOSPITAL_COMMUNITY): Payer: Self-pay

## 2020-12-17 ENCOUNTER — Emergency Department (HOSPITAL_COMMUNITY)
Admission: EM | Admit: 2020-12-17 | Discharge: 2020-12-17 | Disposition: A | Discharge status: Home / Self Care | Payer: Medicare Other | Attending: Emergency Medicine | Admitting: Emergency Medicine

## 2020-12-17 ENCOUNTER — Other Ambulatory Visit: Payer: Self-pay

## 2020-12-17 DIAGNOSIS — I471 Supraventricular tachycardia: Secondary | ICD-10-CM | POA: Insufficient documentation

## 2020-12-17 DIAGNOSIS — Z85820 Personal history of malignant melanoma of skin: Secondary | ICD-10-CM | POA: Insufficient documentation

## 2020-12-17 DIAGNOSIS — R002 Palpitations: Secondary | ICD-10-CM | POA: Diagnosis present

## 2020-12-17 DIAGNOSIS — I10 Essential (primary) hypertension: Secondary | ICD-10-CM | POA: Insufficient documentation

## 2020-12-17 DIAGNOSIS — Z79899 Other long term (current) drug therapy: Secondary | ICD-10-CM | POA: Diagnosis not present

## 2020-12-17 LAB — CBC WITH DIFFERENTIAL/PLATELET
Abs Immature Granulocytes: 0.02 10*3/uL (ref 0.00–0.07)
Basophils Absolute: 0.1 10*3/uL (ref 0.0–0.1)
Basophils Relative: 1 %
Eosinophils Absolute: 0 10*3/uL (ref 0.0–0.5)
Eosinophils Relative: 1 %
HCT: 42.9 % (ref 36.0–46.0)
Hemoglobin: 13.8 g/dL (ref 12.0–15.0)
Immature Granulocytes: 0 %
Lymphocytes Relative: 19 %
Lymphs Abs: 1.4 10*3/uL (ref 0.7–4.0)
MCH: 30 pg (ref 26.0–34.0)
MCHC: 32.2 g/dL (ref 30.0–36.0)
MCV: 93.3 fL (ref 80.0–100.0)
Monocytes Absolute: 0.5 10*3/uL (ref 0.1–1.0)
Monocytes Relative: 7 %
Neutro Abs: 5.3 10*3/uL (ref 1.7–7.7)
Neutrophils Relative %: 72 %
Platelets: 196 10*3/uL (ref 150–400)
RBC: 4.6 MIL/uL (ref 3.87–5.11)
RDW: 13.8 % (ref 11.5–15.5)
WBC: 7.2 10*3/uL (ref 4.0–10.5)
nRBC: 0 % (ref 0.0–0.2)

## 2020-12-17 LAB — COMPREHENSIVE METABOLIC PANEL
ALT: 25 U/L (ref 0–44)
AST: 32 U/L (ref 15–41)
Albumin: 4.1 g/dL (ref 3.5–5.0)
Alkaline Phosphatase: 38 U/L (ref 38–126)
Anion gap: 8 (ref 5–15)
BUN: 13 mg/dL (ref 8–23)
CO2: 27 mmol/L (ref 22–32)
Calcium: 9.4 mg/dL (ref 8.9–10.3)
Chloride: 103 mmol/L (ref 98–111)
Creatinine, Ser: 0.83 mg/dL (ref 0.44–1.00)
GFR, Estimated: >60 mL/min (ref >60)
Glucose, Bld: 119 mg/dL — ABNORMAL HIGH (ref 70–99)
Potassium: 3.8 mmol/L (ref 3.5–5.1)
Sodium: 138 mmol/L (ref 135–145)
Total Bilirubin: 0.8 mg/dL (ref 0.3–1.2)
Total Protein: 7.2 g/dL (ref 6.5–8.1)

## 2020-12-17 LAB — TROPONIN I (HIGH SENSITIVITY)
Troponin I (High Sensitivity): 37 ng/L — ABNORMAL HIGH (ref <18)
Troponin I (High Sensitivity): 65 ng/L — ABNORMAL HIGH (ref <18)

## 2020-12-17 LAB — MAGNESIUM: Magnesium: 2 mg/dL (ref 1.7–2.4)

## 2020-12-17 NOTE — ED Triage Notes (Signed)
Pt stated she had a spell this morning and her pulse ranged from 156-178. From 1215 hrs- 1255 hrs. Pt has a medication that she took but is cant remember the name of it.

## 2020-12-17 NOTE — Discharge Instructions (Addendum)
Your history, exam and tests tonight are reassuring and since you are symptom free, you do not need to stay here tonight.  However,  if you develop any return of symptoms including fast heart rate, or if you develop chest pain, pressure or shortness of breath, return here for a recheck.    Call Dr. Cherlyn Cushing office tomorrow to let them know of todays event and for followup care.  Continue taking your metoprolol without any changes at this time.

## 2020-12-17 NOTE — ED Notes (Signed)
Family updated on plan of care at this time by this nurse.

## 2020-12-17 NOTE — ED Notes (Signed)
Patient reports having several recent stressors including her sister pass away.  Patient states she is not having chest pain, but states she feels like her "body is shaky".

## 2020-12-17 NOTE — ED Provider Notes (Signed)
[facility] Provider Note   CSN: [id number] Arrival date & time: 12/17/20  1329     History Chief Complaint  Patient presents with   Palpitations    Lori Soto is a 85 y.o. female with a history significant for SVT on twice daily metoprolol, distant history of anemia, GERD, ulcerative colitis on mesalamine and history of chronic low blood pressure under the care of Dr. Percival Spanish presenting with an approximate 1 hour history of palpitations, started around noon today till 1 PM shortly before arrival, she checked multiple blood pressures and pulse rates with blood pressures in the 10 5-1 10 range and pulse rates as high as mid 170s.  She denies missing any of her metoprolol doses, although took this morning's dose around 10 AM, little later than her norm.  She denied having any chest pain, shortness of breath, dizziness, she felt jittery when her rates were high.  She also denies headache, nausea or vomiting, diaphoresis.  She has noted that since this episode resolved her pulse has been lower than normal.  She denies taking any extra metoprolol today and has had no recent changes in her dosing.  She denies any excess caffeine intake, no OTC medicine such as pseudoephedrine.  No history of thyroid problems.  She has noted since this episode resolved, her pulse seems lower than her normal 60-70 range.   The history is provided by the patient.      Past Medical History:  Diagnosis Date   Anemia    Arthritis    Cancer ([place])    skin cancer on nose   Carpal tunnel syndrome, bilateral    Cataract    Esophagitis    GERD (gastroesophageal reflux disease)    H/O hiatal hernia    Osteopenia    Scoliosis    Shingles    SVT (supraventricular tachycardia) ([place])    Ulcerative colitis     Patient Active Problem List   Diagnosis Date Noted   Cough 05/29/2020   Chest congestion 05/29/2020   Arthritis 06/13/2019   GERD (gastroesophageal reflux disease) 06/10/2019    Esophageal stricture 06/10/2019   Educated about COVID-19 virus infection 06/04/2019   SVT (supraventricular tachycardia) ([place]) 08/28/2018   Elevated troponin 08/28/2018   Light headedness 05/09/2018   Carpal tunnel syndrome of right wrist 05/20/2015   Primary localized osteoarthrosis, hand 05/20/2015   Carpal tunnel syndrome on left 03/18/2015   Osteopenia of the elderly 02/19/2014   DDD (degenerative disc disease), lumbar 12/03/2013   Scoliosis 12/03/2013   Chronic LBP 05/14/2013   IBS (irritable bowel syndrome) 11/08/2012   UC (ulcerative colitis) ([place]) 04/23/2012   Palpitation 10/26/2011   Hypertension 10/26/2011   Chronic ulcerative colitis ([place]) 03/23/2011    Past Surgical History:  Procedure Laterality Date   ABDOMINAL HYSTERECTOMY     BUNIONECTOMY     Bilateral   CARPAL TUNNEL RELEASE Left 03/10/2015   Procedure: LEFT CARPAL TUNNEL RELEASE;  Surgeon: Daryll Brod, MD;  Location: [facility];  Service: Orthopedics;  Laterality: Left;   CARPAL TUNNEL RELEASE Right 10/31/2017   Procedure: RIGHT CARPAL TUNNEL RELEASE;  Surgeon: Daryll Brod, MD;  Location: [facility];  Service: Orthopedics;  Laterality: Right;   CATARACT EXTRACTION W/PHACO  05/28/2012   Procedure: CATARACT EXTRACTION PHACO AND INTRAOCULAR LENS PLACEMENT (IOC);  Surgeon: Tonny Branch, MD;  Location: [facility];  Service: Ophthalmology;  Laterality: Right;  CDE=12.84   CATARACT EXTRACTION W/PHACO  06/07/2012   Procedure: CATARACT  EXTRACTION PHACO AND INTRAOCULAR LENS PLACEMENT (IOC);  Surgeon: Tonny Branch, MD;  Location: [facility];  Service: Ophthalmology;  Laterality: Left;  CDE 17.60   COLONOSCOPY     COLONOSCOPY N/A 07/08/2015   Procedure: COLONOSCOPY;  Surgeon: Rogene Houston, MD;  [facility];  Service: Endoscopy;  Laterality: N/A;  930   ESOPHAGEAL DILATION N/A 07/08/2015   Procedure: ESOPHAGEAL DILATION;  Surgeon: Rogene Houston, MD;  [facility];  Service: Endoscopy;   Laterality: N/A;   ESOPHAGOGASTRODUODENOSCOPY N/A 07/08/2015   Procedure: ESOPHAGOGASTRODUODENOSCOPY (EGD);  Surgeon: Rogene Houston, MD;  [facility];  Service: Endoscopy;  Laterality: N/A;   Hernia     Right inguinal   HERNIA REPAIR  1961   right inguinal hernia   UPPER GASTROINTESTINAL ENDOSCOPY       OB History   No obstetric history on file.     Family History  Problem Relation Age of Onset   Arthritis Mother    Heart disease Mother    Cancer Father        pancreat   Pancreatic cancer Father    Arthritis Father        back issues    Cancer Brother        lung and liver   Liver cancer Brother    Lung cancer Brother    Heart disease Sister        Rheumatic Fever   Peptic Ulcer Sister    Diabetes Sister    Colon cancer Sister    Edema Sister    GI problems Sister        diverticulitis   Diabetes Sister    Neuropathy Sister    COPD Sister    Hiatal hernia Sister    GI problems Sister     Social History   Tobacco Use   Smoking status: Never   Smokeless tobacco: Never   Tobacco comments:    Never smoker  Vaping Use   Vaping Use: Never used  Substance Use Topics   Alcohol use: Yes    Alcohol/week: 0.0 standard drinks    Comment: Very Rarely   Drug use: No    Home Medications Prior to Admission medications   Medication Sig Start Date End Date Taking? Authorizing Provider  acetaminophen (TYLENOL) 650 MG CR tablet Take 650 mg by mouth every 8 (eight) hours as needed. Patient states that she takes prn for arthritis in her knees.   Yes [provider]  Bromelains 500 MG TABS Take 500 mg by mouth daily.   Yes [provider]  Cayenne 500 MG CAPS Take 1 capsule by mouth daily.    Yes [provider]  Coenzyme Q10 (CO Q-10 PO) Take 1 tablet by mouth daily. With red yeast rice [employer]   Yes [provider]  COLLAGEN PO Take 1 tablet by mouth daily. This is known as 1-2-3   Yes [provider]   Cyanocobalamin (VITAMIN B 12 PO) Take 1,000 mcg by mouth daily.   Yes [provider]  Ginkgo Biloba 40 MG TABS Take 40 mg by mouth daily.   Yes [provider]  glucosamine-chondroitin 500-400 MG tablet Take 1 tablet by mouth 2 (two) times daily.   Yes [provider]  Hyaluronic Acid-Vitamin C (HYALURONIC ACID PO) Take 80 mg by mouth daily.   Yes [provider]  Mesalamine (ASACOL) 400 MG CPDR DR capsule TAKE 2 CAPSULES DAILY 04/28/20  Yes Rehman, Mechele Dawley, MD  metoprolol tartrate (LOPRESSOR) 25 MG tablet Take 1 tablet (25 mg total) by mouth 2 (two) times daily. 08/26/20  Yes Minus Breeding, MD  MULTIPLE VITAMINS PO Take 1 tablet by mouth daily. In the mornings   Yes [provider]  Nutritional Supplements (GRAPESEED EXTRACT PO) Take 60 mg by mouth daily.    Yes [provider]  OAT BRAN SOLUBLE PO Take 1 tablet by mouth daily.   Yes [provider]  OVER THE COUNTER MEDICATION Take 66 mg by mouth daily. Vision Gold Lutein   Yes [provider]  OVER THE COUNTER MEDICATION Take 250 mg by mouth daily. Alphalipoic Acid 250 mg daily   Yes [provider]  OVER THE COUNTER MEDICATION Ultimate Bone Support - Patient takes 1 by mouth daily.   Yes [provider]  pantoprazole (PROTONIX) 40 MG tablet TAKE 1 TABLET DAILY BEFORE BREAKFAST 06/24/20  Yes Rehman, Mechele Dawley, MD  pyridOXINE (VITAMIN B-6) 100 MG tablet Take 100 mg by mouth daily.   Yes [provider]  Turmeric 500 MG CAPS Take 1 capsule by mouth 3 (three) times daily.    Yes [provider]  Vitamins C E (CRANBERRY CONCENTRATE PO) Take 2,500 mg by mouth daily.   Yes [provider]  Zinc 50 MG CAPS Take 50 mg by mouth daily.   Yes [provider]    Allergies    Penicillins, Influenza virus vacc split pf, and Tape  Review of Systems   Review of Systems  Constitutional:  Negative for fever.  HENT:  Negative for  congestion and sore throat.   Eyes: Negative.   Respiratory:  Negative for chest tightness and shortness of breath.   Cardiovascular:  Positive for palpitations. Negative for chest pain.  Gastrointestinal:  Negative for abdominal pain and nausea.  Genitourinary: Negative.   Musculoskeletal:  Negative for arthralgias, joint swelling and neck pain.  Skin: Negative.  Negative for rash and wound.  Neurological:  Negative for dizziness, weakness, light-headedness, numbness and headaches.  Psychiatric/Behavioral: Negative.    All other systems reviewed and are negative.  Physical Exam Updated Vital Signs BP 138/71   Pulse 60   Temp 97.9 F (36.6 C) (Oral)   Resp 20   Ht 5' (1.524 m)   Wt 57.2 kg   SpO2 98%   BMI 24.61 kg/m   Physical Exam Vitals and nursing note reviewed.  Constitutional:      Appearance: She is well-developed.  HENT:     Head: Normocephalic and atraumatic.  Eyes:     Conjunctiva/sclera: Conjunctivae normal.  Cardiovascular:     Rate and Rhythm: Normal rate and regular rhythm.     Heart sounds: Normal heart sounds.  Pulmonary:     Effort: Pulmonary effort is normal.     Breath sounds: Normal breath sounds. No wheezing.  Abdominal:     General: Bowel sounds are normal.     Palpations: Abdomen is soft.     Tenderness: There is no abdominal tenderness.  Musculoskeletal:        General: Normal range of motion.     Cervical back: Normal range of motion.  Skin:    General: Skin is warm and dry.  Neurological:     Mental Status: She is alert.    ED Results / Procedures / Treatments   Labs (all labs ordered are listed, but only abnormal results are displayed) Labs Reviewed  COMPREHENSIVE METABOLIC PANEL -  Abnormal; Notable for the following components:      Result Value   Glucose, Bld 119 (*)    All other components within normal limits  TROPONIN I (HIGH SENSITIVITY) - Abnormal; Notable for the following components:   Troponin I (High Sensitivity) 37  (*)    All other components within normal limits  TROPONIN I (HIGH SENSITIVITY) - Abnormal; Notable for the following components:   Troponin I (High Sensitivity) 65 (*)    All other components within normal limits  CBC WITH DIFFERENTIAL/PLATELET  MAGNESIUM    EKG EKG Interpretation  Date/Time:  Thursday December 17 2020 14:05:53 EDT Ventricular Rate:  63 PR Interval:  134 QRS Duration: 70 QT Interval:  402 QTC Calculation: 411 R Axis:   56 Text Interpretation: Normal sinus rhythm Low voltage QRS Borderline ECG Confirmed by Octaviano Glow [id number]) on 12/17/2020 6:33:20 PM  Radiology No results found.  Procedures Procedures   Medications Ordered in ED Medications - No data to display  ED Course  I have reviewed the triage vital signs and the nursing notes.  Pertinent labs & imaging results that were available during my care of the patient were reviewed by me and considered in my medical decision making (see chart for details).    MDM Rules/Calculators/A&P                           Patient with history suggestive of an episode of SVT, patient states her symptoms are similar to her prior similar events.  She had no chest pain, shortness of breath, diaphoresis or any other concerning symptoms that would suggest ACS.  Her troponins are relatively flat, but mildly elevated, probably demand ischemia from an hour of significant tachycardia.  She has been symptom-free while here.  She was felt stable for discharge home.  However she is given strict return precautions for any chest pain, shortness of breath and of course return of palpitations.  She was advised to continue her current medications including her metoprolol without any changes, but to follow-up with Dr. Percival Spanish for office recheck.  Patient was seen by Dr. Langston Masker during this ED visit. Final Clinical Impression(s) / ED Diagnoses Final diagnoses:  SVT (supraventricular tachycardia) [facility])    Rx / DC Orders ED Discharge  Orders     None        Landis Martins 12/17/20 1952    Wyvonnia Dusky, MD 12/18/20 [phone]

## 2020-12-21 ENCOUNTER — Encounter: Payer: Self-pay | Admitting: Cardiology

## 2020-12-21 NOTE — Telephone Encounter (Signed)
error 

## 2020-12-22 ENCOUNTER — Encounter (INDEPENDENT_AMBULATORY_CARE_PROVIDER_SITE_OTHER): Payer: Self-pay | Admitting: Internal Medicine

## 2020-12-22 ENCOUNTER — Other Ambulatory Visit: Payer: Self-pay

## 2020-12-22 ENCOUNTER — Ambulatory Visit (INDEPENDENT_AMBULATORY_CARE_PROVIDER_SITE_OTHER): Payer: Medicare Other | Admitting: Internal Medicine

## 2020-12-22 VITALS — BP 156/83 | HR 60 | Temp 98.8°F | Ht 60.0 in | Wt 130.3 lb

## 2020-12-22 DIAGNOSIS — K2100 Gastro-esophageal reflux disease with esophagitis, without bleeding: Secondary | ICD-10-CM | POA: Diagnosis not present

## 2020-12-22 DIAGNOSIS — K222 Esophageal obstruction: Secondary | ICD-10-CM

## 2020-12-22 DIAGNOSIS — K5100 Ulcerative (chronic) pancolitis without complications: Secondary | ICD-10-CM | POA: Diagnosis not present

## 2020-12-22 NOTE — Progress Notes (Signed)
Presenting complaint;  Follow for chronic ulcerative colitis and complicated GERD.  Database and subjective:  Patient is a 85-year-old Caucasian female who is here for scheduled visit.  She was last seen about 6 months ago.  She has history of pan ulcerative colitis for more than 20 years and has been in remission.  Last surveillance colonoscopy was in March 2017.  She also has erosive reflux esophagitis complicated by esophageal stricture last dilated in March 2017.  She says she is doing well.  She generally has 1 formed stool daily.  She denies abdominal pain melena or rectal bleeding.  She states she has very good appetite. While she is not have any heartburn she does report burping every morning.  She does not have any nausea when she burps.  She also has to clear her throat often.  She has difficulty with bread and pills.  She feels these stick to her throat.  Sometimes she has to cough back the pills before she could swallow them again. She has been on pantoprazole every other day. She had complete blood work when she was in emergency room on 12/17/2020 for rapid heartbeat.  She has a history of SVT.   Current Medications: Outpatient Encounter Medications as of 12/22/2020  Medication Sig   acetaminophen (TYLENOL) 650 MG CR tablet Take 650 mg by mouth every 8 (eight) hours as needed. Patient states that she takes prn for arthritis in her knees.   Bromelains 500 MG TABS Take 500 mg by mouth daily.   Coenzyme Q10 (CO Q-10 PO) Take 1 tablet by mouth daily. With red yeast rice [employer]   COLLAGEN PO Take 1 tablet by mouth daily. This is known as 1-2-3   Cyanocobalamin (VITAMIN B 12 PO) Take 1,000 mcg by mouth daily.   Ginkgo Biloba 40 MG TABS Take 40 mg by mouth daily.   glucosamine-chondroitin 500-400 MG tablet Take 1 tablet by mouth 2 (two) times daily.   Hyaluronic Acid-Vitamin C (HYALURONIC ACID PO) Take 80 mg by mouth daily.   Mesalamine (ASACOL) 400 MG CPDR DR capsule TAKE  2 CAPSULES DAILY   metoprolol tartrate (LOPRESSOR) 25 MG tablet Take 1 tablet (25 mg total) by mouth 2 (two) times daily.   MULTIPLE VITAMINS PO Take 1 tablet by mouth daily. In the mornings   Nutritional Supplements (GRAPESEED EXTRACT PO) Take 60 mg by mouth daily.    OAT BRAN SOLUBLE PO Take 1 tablet by mouth daily.   OVER THE COUNTER MEDICATION Take 66 mg by mouth daily. Vision Gold Lutein   OVER THE COUNTER MEDICATION Take 250 mg by mouth daily. Alphalipoic Acid 250 mg daily   OVER THE COUNTER MEDICATION Ultimate Bone Support - Patient takes 1 by mouth daily.   pantoprazole (PROTONIX) 40 MG tablet TAKE 1 TABLET DAILY BEFORE BREAKFAST   pyridOXINE (VITAMIN B-6) 100 MG tablet Take 100 mg by mouth daily.   Turmeric 500 MG CAPS Take 1 capsule by mouth 3 (three) times daily.    Vitamins C E (CRANBERRY CONCENTRATE PO) Take 2,500 mg by mouth daily.   Zinc 50 MG CAPS Take 50 mg by mouth daily.   [DISCONTINUED] Cayenne 500 MG CAPS Take 1 capsule by mouth daily.    No facility-administered encounter medications on file as of 12/22/2020.     Objective: Blood pressure (!) 156/83, pulse 60, temperature 98.8 F (37.1 C), temperature source Oral, height 5' (1.524 m), weight 130 lb 4.8 oz (59.1 kg). Patient is alert and  in no acute distress. Conjunctiva is pink. Sclera is nonicteric Oropharyngeal mucosa is normal. No neck masses or thyromegaly noted. Cardiac exam with regular rhythm normal S1 and S2. No murmur or gallop noted. Lungs are clear to auscultation. Abdomen is symmetrical soft and nontender with organomegaly or masses. No LE edema or clubbing noted.  Labs/studies Results:   CBC Latest Ref Rng & Units 12/17/2020 10/29/2019 05/23/2019  WBC 4.0 - 10.5 K/uL 7.2 5.3 5.5  Hemoglobin 12.0 - 15.0 g/dL 13.8 13.9 13.6  Hematocrit 36.0 - 46.0 % 42.9 41.7 39.8  Platelets 150 - 400 K/uL 196 233 237    CMP Latest Ref Rng & Units 12/17/2020 11/09/2020 04/29/2020  Glucose 70 - 99 mg/dL 119(H) 93 92   BUN 8 - 23 mg/dL 13 10 10   Creatinine 0.44 - 1.00 mg/dL 0.83 0.80 0.85  Sodium 135 - 145 mmol/L 138 141 141  Potassium 3.5 - 5.1 mmol/L 3.8 4.5 5.3(H)  Chloride 98 - 111 mmol/L 103 101 101  CO2 22 - 32 mmol/L 27 27 26   Calcium 8.9 - 10.3 mg/dL 9.4 9.7 10.7(H)  Total Protein 6.5 - 8.1 g/dL 7.2 - 7.0  Total Bilirubin 0.3 - 1.2 mg/dL 0.8 - 0.6  Alkaline Phos 38 - 126 U/L 38 - 43(L)  AST 15 - 41 U/L 32 - 21  ALT 0 - 44 U/L 25 - 13    Hepatic Function Latest Ref Rng & Units 12/17/2020 04/29/2020 10/29/2019  Total Protein 6.5 - 8.1 g/dL 7.2 7.0 6.9  Albumin 3.5 - 5.0 g/dL 4.1 4.3 3.9  AST 15 - 41 U/L 32 21 18  ALT 0 - 44 U/L 25 13 15   Alk Phosphatase 38 - 126 U/L 38 43(L) 44(L)  Total Bilirubin 0.3 - 1.2 mg/dL 0.8 0.6 0.6  Bilirubin, Direct 0.00 - 0.40 mg/dL - - -    Lab data from 12/17/2020 reviewed with patient.   Assessment:  #1.  Chronic ulcerative colitis.  She remains in remission on low-dose oral mesalamine.  Last surveillance colonoscopy was in March 2017 and she is elected not to have any more procedures unless she is having symptoms which I think is appropriate given her age.  #2.  Chronic GERD complicated by distal esophageal stricture which was last dilated in March 2017.  She is not having any heartburn while on PPI every other day but she is having some throat symptoms and dysphagia pills and occasionally with bread.  It remains to be seen if she would do better with daily PPI.   Plan:  Continue mesalamine at a dose of 800 mg by mouth daily. Take pantoprazole 40 mg p.o. every morning instead of every other day. If throat symptom improved with daily PPI dose she should continue otherwise she can drop it back to every other day. Patient will call office if she has dysphagia or heartburn despite taking medications. Office visit in 6 months.

## 2020-12-22 NOTE — Patient Instructions (Signed)
Notify if you have swallowing difficulty or if pantoprazole stops controlling heartburn.

## 2020-12-23 NOTE — Progress Notes (Signed)
Virtual Visit via Telephone Note   This visit type was conducted due to national recommendations for restrictions regarding the COVID-19 Pandemic (e.g. social distancing) in an effort to limit this patient's exposure and mitigate transmission in our community.  Due to her co-morbid illnesses, this patient is at least at moderate risk for complications without adequate follow up.  This format is felt to be most appropriate for this patient at this time.  The patient did not have access to video technology/had technical difficulties with video requiring transitioning to audio format only (telephone).  All issues noted in this document were discussed and addressed.  No physical exam could be performed with this format.  Please refer to the patient's chart for her  consent to telehealth for [facility].    Date:  12/24/2020   ID:  Lori Soto, DOB 05/19/1935, MRN [id number] The patient was identified using 2 identifiers.  Patient Location: Home Provider Location: Office/Clinic   PCP:  [name], DO   [place] Providers Cardiologist:  Minus Breeding, MD     Evaluation Performed:  Follow-Up Visit  Chief Complaint:  Palpiatations  History of Present Illness:    Lori Soto is a 85 y.o. female who presents for evaluation of palpitations.   I saw her for evaluation of palpitations in 2014.  She has a heart murmur and an echo in 2011 was normal.  She was in in the ED in August. 2018 but there was no recorded arrhythmia.  She had palpitations but these were infrequent so no further work up was suggested.   She was in the ED in April 2020 with SVT.  She had mildly elevated troponin.   She had an SVT with a rate of 174 She was treated with adenosine.     She was treated with metoprolol.   She was in the ED last week with palpitations.  She was in NSR at the time of the presentation.  I reviewed these records for this visit.    She had very mild troponin elevation.   She  said that she was sitting in quiet when this happened.  There was no stimulus.  She has had some emotional stress.  She has not had any since then.  This is the first severe episode since early 2021.  She did not really have any chest discomfort except may be a twinge of pressure during that event.  She otherwise is active and does not get any symptoms.  She denies chest pressure, neck or arm discomfort.  She has no shortness of breath, PND or orthopnea.  Of note she has been taking 25 mg of metoprolol in the morning and 12 and half at night.   The patient does not have symptoms concerning for COVID-19 infection (fever, chills, cough, or new shortness of breath).    Past Medical History:  Diagnosis Date   Anemia    Arthritis    Cancer ([place])    skin cancer on nose   Carpal tunnel syndrome, bilateral    Cataract    Esophagitis    GERD (gastroesophageal reflux disease)    H/O hiatal hernia    Osteopenia    Scoliosis    Shingles    SVT (supraventricular tachycardia) (HCC)    Ulcerative colitis    Past Surgical History:  Procedure Laterality Date   ABDOMINAL HYSTERECTOMY     BUNIONECTOMY     Bilateral   CARPAL TUNNEL RELEASE Left 03/10/2015  Procedure: LEFT CARPAL TUNNEL RELEASE;  Surgeon: Daryll Brod, MD;  Location: [place];  Service: Orthopedics;  Laterality: Left;   CARPAL TUNNEL RELEASE Right 10/31/2017   Procedure: RIGHT CARPAL TUNNEL RELEASE;  Surgeon: Daryll Brod, MD;  Location: [place];  Service: Orthopedics;  Laterality: Right;   CATARACT EXTRACTION W/PHACO  05/28/2012   Procedure: CATARACT EXTRACTION PHACO AND INTRAOCULAR LENS PLACEMENT (IOC);  Surgeon: Tonny Branch, MD;  [facility];  Service: Ophthalmology;  Laterality: Right;  CDE=12.84   CATARACT EXTRACTION W/PHACO  06/07/2012   Procedure: CATARACT EXTRACTION PHACO AND INTRAOCULAR LENS PLACEMENT (IOC);  Surgeon: Tonny Branch, MD;  [facility];  Service: Ophthalmology;  Laterality: Left;   CDE 17.60   COLONOSCOPY     COLONOSCOPY N/A 07/08/2015   Procedure: COLONOSCOPY;  Surgeon: Rogene Houston, MD;  [facility];  Service: Endoscopy;  Laterality: N/A;  930   ESOPHAGEAL DILATION N/A 07/08/2015   Procedure: ESOPHAGEAL DILATION;  Surgeon: Rogene Houston, MD;  [facility];  Service: Endoscopy;  Laterality: N/A;   ESOPHAGOGASTRODUODENOSCOPY N/A 07/08/2015   Procedure: ESOPHAGOGASTRODUODENOSCOPY (EGD);  Surgeon: Rogene Houston, MD;  [facility];  Service: Endoscopy;  Laterality: N/A;   Hernia     Right inguinal   HERNIA REPAIR  1961   right inguinal hernia   UPPER GASTROINTESTINAL ENDOSCOPY       Current Meds  Medication Sig   acetaminophen (TYLENOL) 650 MG CR tablet Take 650 mg by mouth every 8 (eight) hours as needed. Patient states that she takes prn for arthritis in her knees.   Bromelains 500 MG TABS Take 500 mg by mouth daily.   Coenzyme Q10 (CO Q-10 PO) Take 1 tablet by mouth daily. With red yeast rice [employer]   COLLAGEN PO Take 1 tablet by mouth daily. This is known as 1-2-3   Cyanocobalamin (VITAMIN B 12 PO) Take 1,000 mcg by mouth daily.   Ginkgo Biloba 40 MG TABS Take 40 mg by mouth daily.   glucosamine-chondroitin 500-400 MG tablet Take 1 tablet by mouth 2 (two) times daily.   Hyaluronic Acid-Vitamin C (HYALURONIC ACID PO) Take 80 mg by mouth daily.   Mesalamine (ASACOL) 400 MG CPDR DR capsule TAKE 2 CAPSULES DAILY   MULTIPLE VITAMINS PO Take 1 tablet by mouth daily. In the mornings   Nutritional Supplements (GRAPESEED EXTRACT PO) Take 60 mg by mouth daily.    OAT BRAN SOLUBLE PO Take 1 tablet by mouth daily.   OVER THE COUNTER MEDICATION Take 66 mg by mouth daily. Vision Gold Lutein   OVER THE COUNTER MEDICATION Take 250 mg by mouth daily. Alphalipoic Acid 250 mg daily   OVER THE COUNTER MEDICATION Ultimate Bone Support - Patient takes 1 by mouth daily.   pantoprazole (PROTONIX) 40 MG tablet TAKE 1 TABLET DAILY BEFORE  BREAKFAST   pyridOXINE (VITAMIN B-6) 100 MG tablet Take 100 mg by mouth daily.   Turmeric 500 MG CAPS Take 1 capsule by mouth 3 (three) times daily.    Vitamins C E (CRANBERRY CONCENTRATE PO) Take 2,500 mg by mouth daily.   Zinc 50 MG CAPS Take 50 mg by mouth daily.   [DISCONTINUED] metoprolol tartrate (LOPRESSOR) 25 MG tablet Take 1 tablet (25 mg total) by mouth 2 (two) times daily.     Allergies:   Penicillins, Influenza virus vacc split pf, and Tape   Social History   Tobacco Use   Smoking status:  Never   Smokeless tobacco: Never   Tobacco comments:    Never smoker  Vaping Use   Vaping Use: Never used  Substance Use Topics   Alcohol use: Yes    Alcohol/week: 0.0 standard drinks    Comment: Very Rarely   Drug use: No     Family Hx: The patient's family history includes Arthritis in her father and mother; COPD in her sister; Cancer in her brother and father; Colon cancer in her sister; Diabetes in her sister and sister; Edema in her sister; GI problems in her sister and sister; Heart disease in her mother and sister; Hiatal hernia in her sister; Liver cancer in her brother; Lung cancer in her brother; Neuropathy in her sister; Pancreatic cancer in her father; Peptic Ulcer in her sister.  ROS:   Please see the history of present illness.    None All other systems reviewed and are negative.   Prior CV studies:   The following studies were reviewed today:  Hospital records  Labs/Other Tests and Data Reviewed:    EKG:  An ECG dated 12/17/2020 was personally reviewed today and demonstrated:  NSR, rate 63, no acute ST-T wave changes.  Recent Labs: 12/17/2020: ALT 25; BUN 13; Creatinine, Ser 0.83; Hemoglobin 13.8; Magnesium 2.0; Platelets 196; Potassium 3.8; Sodium 138   Recent Lipid Panel Lab Results  Component Value Date/Time   CHOL 196 04/29/2020 10:59 AM   TRIG 82 04/29/2020 10:59 AM   TRIG 79 12/18/2015 11:05 AM   HDL 60 04/29/2020 10:59 AM   HDL 65 12/18/2015 11:05  AM   CHOLHDL 3.3 04/29/2020 10:59 AM   LDLCALC 121 (H) 04/29/2020 10:59 AM   LDLCALC 109 (H) 11/29/2013 08:22 AM    Wt Readings from Last 3 Encounters:  12/24/20 130 lb (59 kg)  12/22/20 130 lb 4.8 oz (59.1 kg)  12/17/20 126 lb (57.2 kg)     Risk Assessment/Calculations:          Objective:    Vital Signs:  BP 138/75   Pulse (!) 55   Ht 5' (1.524 m)   Wt 130 lb (59 kg)   BMI 25.39 kg/m      ASSESSMENT & PLAN:    SVT:   We had a long conversation about this again today.  She is going to start taking 12 and half milligrams extra metoprolol in the middle of the day.  She still does not want to consider ablation.  She does not think it is frequent enough.   HTN: Her blood pressure is controlled.  No change in therapy other than above.   ELEVATED TROP: Her troponin was minimally elevated.  This was probably demand.  She is otherwise had no symptoms.  There were no acute EKG changes.  No change in therapy.       COVID-19 Education: The signs and symptoms of COVID-19 were discussed with the patient and how to seek care for testing (follow up with PCP or arrange E-visit).  The importance of social distancing was discussed today.  Time:   Today, I have spent 16 minutes with the patient with telehealth technology discussing the above problems.     Medication Adjustments/Labs and Tests Ordered: Current medicines are reviewed at length with the patient today.  Concerns regarding medicines are outlined above.   Tests Ordered: No orders of the defined types were placed in this encounter.   Medication Changes: Meds ordered this encounter  Medications   metoprolol tartrate (LOPRESSOR) 25 MG  tablet    Sig: Take 1 tablet (25 mg total) by mouth every morning AND 0.5 tablets (12.5 mg total) daily with lunch AND 0.5 tablets (12.5 mg total) every evening.    Dispense:  180 tablet    Refill:  3     Follow Up:  In Person  in [place] in 4 months  Signed, Minus Breeding, MD   12/24/2020 10:28 AM    [place]

## 2020-12-24 ENCOUNTER — Encounter: Payer: Self-pay | Admitting: Cardiology

## 2020-12-24 ENCOUNTER — Telehealth (INDEPENDENT_AMBULATORY_CARE_PROVIDER_SITE_OTHER): Payer: Medicare Other | Admitting: Cardiology

## 2020-12-24 VITALS — BP 138/75 | HR 55 | Ht 60.0 in | Wt 130.0 lb

## 2020-12-24 DIAGNOSIS — I471 Supraventricular tachycardia: Secondary | ICD-10-CM

## 2020-12-24 DIAGNOSIS — I10 Essential (primary) hypertension: Secondary | ICD-10-CM | POA: Diagnosis not present

## 2020-12-24 MED ORDER — METOPROLOL TARTRATE 25 MG PO TABS: ORAL_TABLET | ORAL | 3 refills | Status: DC

## 2020-12-24 NOTE — Patient Instructions (Signed)
Medication Instructions:  TAKE METOPROLOL TARTRATE 68m IN THE MORNING TAKE METOPROLOL TARTRATE 12.539mIN THE AFTERNOON  TAKE METOPROLOL TARTRATE 12.52m32mN THE EVENING  *If you need a refill on your cardiac medications before your next appointment, please call your pharmacy*  Follow-Up: [facility] are our [facility].  As part of our continuing mission to provide you with exceptional heart care, we have created designated Provider Care Teams.  These Care Teams include your primary Cardiologist (physician) and Advanced Practice Providers (APPs -  Physician Assistants and Nurse Practitioners) who all work together to provide you with the care you need, when you need it.  Your next appointment:   4 month(s)  The format for your next appointment:   In Person  Provider:   Dr. HOCPercival Spanish [place]

## 2021-04-05 NOTE — Progress Notes (Signed)
Cardiology Office Note   Date:  04/07/2021   ID:  Lori Soto, DOB [birth year], MRN [id number]  PCP:  [name], DO  Cardiologist:   Minus Breeding, MD   Chief Complaint  Patient presents with   Palpitations       History of Present Illness: Lori Soto is a 85 y.o. female who presents for evaluation of palpitations.   I saw her for evaluation of palpitations in 2014.  She has a heart murmur and an echo in 2011 was normal.  She was in in the ED in August. 2018 but there was no recorded arrhythmia.  She had palpitations but these were infrequent so no further work up was suggested.   She was in the ED in April 2020 with SVT.  She had mildly elevated troponin.   She had an SVT with a rate of 174 She was treated with adenosine.     She was treated with metoprolol.   She was in the ED in August with palpitations.  She was in NSR at the time of the presentation.  She had very mild troponin elevation.   This was felt to be demand ischemia.  Since then she has had no further tachypalpitations.  She has had no chest pressure, neck or arm discomfort.  She denies any shortness of breath, PND or orthopnea.  She had no weight gain or edema.    Past Medical History:  Diagnosis Date   Anemia    Arthritis    Cancer ([place])    skin cancer on nose   Carpal tunnel syndrome, bilateral    Cataract    Esophagitis    GERD (gastroesophageal reflux disease)    H/O hiatal hernia    Osteopenia    Scoliosis    Shingles    SVT (supraventricular tachycardia) (HCC)    Ulcerative colitis     Past Surgical History:  Procedure Laterality Date   ABDOMINAL HYSTERECTOMY     BUNIONECTOMY     Bilateral   CARPAL TUNNEL RELEASE Left 03/10/2015   Procedure: LEFT CARPAL TUNNEL RELEASE;  Surgeon: Daryll Brod, MD;  Location: [place];  Service: Orthopedics;  Laterality: Left;   CARPAL TUNNEL RELEASE Right 10/31/2017   Procedure: RIGHT CARPAL TUNNEL RELEASE;  Surgeon: Daryll Brod, MD;  Location: [place];  Service: Orthopedics;  Laterality: Right;   CATARACT EXTRACTION W/PHACO  05/28/2012   Procedure: CATARACT EXTRACTION PHACO AND INTRAOCULAR LENS PLACEMENT (IOC);  Surgeon: Tonny Branch, MD;  [facility];  Service: Ophthalmology;  Laterality: Right;  CDE=12.84   CATARACT EXTRACTION W/PHACO  06/07/2012   Procedure: CATARACT EXTRACTION PHACO AND INTRAOCULAR LENS PLACEMENT (IOC);  Surgeon: Tonny Branch, MD;  [facility];  Service: Ophthalmology;  Laterality: Left;  CDE 17.60   COLONOSCOPY     COLONOSCOPY N/A 07/08/2015   Procedure: COLONOSCOPY;  Surgeon: Rogene Houston, MD;  [facility];  Service: Endoscopy;  Laterality: N/A;  930   ESOPHAGEAL DILATION N/A 07/08/2015   Procedure: ESOPHAGEAL DILATION;  Surgeon: Rogene Houston, MD;  [facility];  Service: Endoscopy;  Laterality: N/A;   ESOPHAGOGASTRODUODENOSCOPY N/A 07/08/2015   Procedure: ESOPHAGOGASTRODUODENOSCOPY (EGD);  Surgeon: Rogene Houston, MD;  [facility];  Service: Endoscopy;  Laterality: N/A;   Hernia     Right inguinal   HERNIA REPAIR  1961   right inguinal hernia   UPPER GASTROINTESTINAL ENDOSCOPY  Current Outpatient Medications  Medication Sig Dispense Refill   acetaminophen (TYLENOL) 650 MG CR tablet Take 650 mg by mouth every 8 (eight) hours as needed. Patient states that she takes prn for arthritis in her knees.     Bromelains 500 MG TABS Take 500 mg by mouth daily.     Coenzyme Q10 (CO Q-10 PO) Take 1 tablet by mouth daily. With red yeast rice [employer]     COLLAGEN PO Take 1 tablet by mouth daily. This is known as 1-2-3     Cyanocobalamin (VITAMIN B 12 PO) Take 1,000 mcg by mouth daily.     Ginkgo Biloba 40 MG TABS Take 40 mg by mouth daily.     glucosamine-chondroitin 500-400 MG tablet Take 1 tablet by mouth 2 (two) times daily.     Hyaluronic Acid-Vitamin C (HYALURONIC ACID PO) Take 80 mg by mouth daily.     Mesalamine  (ASACOL) 400 MG CPDR DR capsule TAKE 2 CAPSULES DAILY 180 capsule 3   metoprolol tartrate (LOPRESSOR) 25 MG tablet Take 1 tablet (25 mg total) by mouth every morning AND 0.5 tablets (12.5 mg total) daily with lunch AND 0.5 tablets (12.5 mg total) every evening. (Patient taking differently: Take 1 tablet (25 mg total) by mouth every morning  AND 0.5 tablets (12.5 mg total) every evening.) 180 tablet 3   MULTIPLE VITAMINS PO Take 1 tablet by mouth daily. In the mornings     Nutritional Supplements (GRAPESEED EXTRACT PO) Take 60 mg by mouth daily.      OAT BRAN SOLUBLE PO Take 1 tablet by mouth daily.     OVER THE COUNTER MEDICATION Take 66 mg by mouth daily. Vision Gold Lutein     OVER THE COUNTER MEDICATION Take 250 mg by mouth daily. Alphalipoic Acid 250 mg daily     pantoprazole (PROTONIX) 40 MG tablet TAKE 1 TABLET DAILY BEFORE BREAKFAST 90 tablet 3   pyridOXINE (VITAMIN B-6) 100 MG tablet Take 100 mg by mouth daily.     Turmeric 500 MG CAPS Take 1 capsule by mouth 3 (three) times daily.      Vitamins C E (CRANBERRY CONCENTRATE PO) Take 2,500 mg by mouth daily.     Zinc 50 MG CAPS Take 50 mg by mouth daily.     OVER THE COUNTER MEDICATION Ultimate Bone Support - Patient takes 1 by mouth daily. (Patient not taking: Reported on 04/07/2021)     No current facility-administered medications for this visit.    Allergies:   Penicillins, Influenza virus vacc split pf, and Tape    ROS:  Please see the history of present illness.   Otherwise, review of systems are positive for none.   All other systems are reviewed and negative.    PHYSICAL EXAM: VS:  BP 132/80   Pulse 64   Ht 5' (1.524 m)   Wt 131 lb (59.4 kg)   BMI 25.58 kg/m  , BMI Body mass index is 25.58 kg/m. GENERAL:  Well appearing NECK:  No jugular venous distention, waveform within normal limits, carotid upstroke brisk and symmetric, no bruits, no thyromegaly LUNGS:  Clear to auscultation bilaterally CHEST:  Unremarkable HEART:   PMI not displaced or sustained,S1 and S2 within normal limits, no S3, no S4, no clicks, no rubs, no murmurs ABD:  Flat, positive bowel sounds normal in frequency in pitch, no bruits, no rebound, no guarding, no midline pulsatile mass, no hepatomegaly, no splenomegaly EXT:  2 plus pulses throughout, no  edema, no cyanosis no clubbing   EKG:  EKG is not ordered today. NA   Recent Labs: 12/17/2020: ALT 25; BUN 13; Creatinine, Ser 0.83; Hemoglobin 13.8; Magnesium 2.0; Platelets 196; Potassium 3.8; Sodium 138    Lipid Panel    Component Value Date/Time   CHOL 196 04/29/2020 1059   TRIG 82 04/29/2020 1059   TRIG 79 12/18/2015 1105   HDL 60 04/29/2020 1059   HDL 65 12/18/2015 1105   CHOLHDL 3.3 04/29/2020 1059   LDLCALC 121 (H) 04/29/2020 1059   LDLCALC 109 (H) 11/29/2013 0822      Wt Readings from Last 3 Encounters:  04/07/21 131 lb (59.4 kg)  12/24/20 130 lb (59 kg)  12/22/20 130 lb 4.8 oz (59.1 kg)      Other studies Reviewed: Additional studies/ records that were reviewed today include: None. Review of the above records demonstrates:  Please see elsewhere in the note.     ASSESSMENT AND PLAN:   SVT:   We again had a long discussion about this.  We talked about vagal maneuvers.  We talked about taking an extra dose of beta-blocker.  He does not want to consider an ablation at this point.    HTN:   Her blood pressure is at target.  No change in therapy.   Current medicines are reviewed at length with the patient today.  The patient does not have concerns regarding medicines.  The following changes have been made:  no change  Labs/ tests ordered today include: None No orders of the defined types were placed in this encounter.    Disposition:   FU with me in one year.     Signed, Minus Breeding, MD  04/07/2021 12:11 PM    [facility]

## 2021-04-07 ENCOUNTER — Encounter: Payer: Self-pay | Admitting: Cardiology

## 2021-04-07 ENCOUNTER — Ambulatory Visit (INDEPENDENT_AMBULATORY_CARE_PROVIDER_SITE_OTHER): Payer: Medicare Other | Admitting: Cardiology

## 2021-04-07 ENCOUNTER — Other Ambulatory Visit: Payer: Self-pay

## 2021-04-07 VITALS — BP 132/80 | HR 64 | Ht 60.0 in | Wt 131.0 lb

## 2021-04-07 DIAGNOSIS — I471 Supraventricular tachycardia: Secondary | ICD-10-CM

## 2021-04-07 DIAGNOSIS — I10 Essential (primary) hypertension: Secondary | ICD-10-CM

## 2021-04-07 NOTE — Patient Instructions (Signed)
Medication Instructions:  The current medical regimen is effective;  continue present plan and medications.  *If you need a refill on your cardiac medications before your next appointment, please call your pharmacy*  Follow-Up: At [facility], you and your health needs are our priority.  As part of our continuing mission to provide you with exceptional heart care, we have created designated Provider Care Teams.  These Care Teams include your primary Cardiologist (physician) and Advanced Practice Providers (APPs -  Physician Assistants and Nurse Practitioners) who all work together to provide you with the care you need, when you need it.  We recommend signing up for the patient portal called "MyChart".  Sign up information is provided on this After Visit Summary.  MyChart is used to connect with patients for Virtual Visits (Telemedicine).  Patients are able to view lab/test results, encounter notes, upcoming appointments, etc.  Non-urgent messages can be sent to your provider as well.   To learn more about what you can do with MyChart, go to [email or web address].com.    Your next appointment:   1 year(s)  The format for your next appointment:   In Person  Provider:   James Hochrein, MD   Thank you for choosing [facility]!!    

## 2021-04-14 ENCOUNTER — Ambulatory Visit: Payer: Medicare Other | Admitting: Cardiology

## 2021-04-19 ENCOUNTER — Other Ambulatory Visit (INDEPENDENT_AMBULATORY_CARE_PROVIDER_SITE_OTHER): Payer: Self-pay | Admitting: Internal Medicine

## 2021-04-19 NOTE — Telephone Encounter (Signed)
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 Laural Golden.

## 2021-05-11 ENCOUNTER — Encounter: Payer: Self-pay | Admitting: Family Medicine

## 2021-05-11 ENCOUNTER — Ambulatory Visit (INDEPENDENT_AMBULATORY_CARE_PROVIDER_SITE_OTHER): Payer: Medicare Other | Admitting: Family Medicine

## 2021-05-11 VITALS — BP 118/78 | HR 73 | Temp 97.6°F | Ht 60.0 in | Wt 129.0 lb

## 2021-05-11 DIAGNOSIS — M5442 Lumbago with sciatica, left side: Secondary | ICD-10-CM

## 2021-05-11 DIAGNOSIS — E559 Vitamin D deficiency, unspecified: Secondary | ICD-10-CM | POA: Diagnosis not present

## 2021-05-11 DIAGNOSIS — E782 Mixed hyperlipidemia: Secondary | ICD-10-CM

## 2021-05-11 DIAGNOSIS — I10 Essential (primary) hypertension: Secondary | ICD-10-CM | POA: Diagnosis not present

## 2021-05-11 DIAGNOSIS — M85852 Other specified disorders of bone density and structure, left thigh: Secondary | ICD-10-CM | POA: Diagnosis not present

## 2021-05-11 DIAGNOSIS — Z0000 Encounter for general adult medical examination without abnormal findings: Secondary | ICD-10-CM

## 2021-05-11 DIAGNOSIS — M5441 Lumbago with sciatica, right side: Secondary | ICD-10-CM | POA: Diagnosis not present

## 2021-05-11 NOTE — Progress Notes (Signed)
Lori Soto is a 86 y.o. female presents to office today for annual physical exam examination.    Concerns today include: 1.  Right back pain Patient reports that she has been having some intermittent right-sided back pain that radiates down the right lower extremity.  She was not sure if this was her back or hip but she does have a history of degenerative changes in her low back as well as thoracolumbar scoliosis.  She wonders if she might have a leg length discrepancy.  She is willing to go back to physical therapy as this has been helpful in the past, particularly traction therapy.  Occupation: Retired  Diet: Tries to maintain a healthy diet, Exercise: Stays active Last eye exam: Up-to-date Last dental exam: Up-to-date Last colonoscopy: Up-to-date Last mammogram: Up-to-date Immunizations needed: Immunization History  Administered Date(s) Administered   Pneumococcal Conjugate-13 02/26/2014   Pneumococcal Polysaccharide-23 03/20/2012   Td 01/23/2015   Tdap 01/23/2015     Past Medical History:  Diagnosis Date   Anemia    Arthritis    Cancer ([place])    skin cancer on nose   Carpal tunnel syndrome, bilateral    Cataract    Esophagitis    GERD (gastroesophageal reflux disease)    H/O hiatal hernia    Osteopenia    Scoliosis    Shingles    SVT (supraventricular tachycardia) ([facility])    Ulcerative colitis    Social History   Socioeconomic History   Marital status: Widowed    Spouse name: Not on file   Number of children: 3   Years of education: Not on file   Highest education level: Bachelor's degree (e.g., BA, AB, BS)  Occupational History    Employer: RETIRED    Comment: accounting  Tobacco Use   Smoking status: Never   Smokeless tobacco: Never   Tobacco comments:    Never smoker  Vaping Use   Vaping Use: Never used  Substance and Sexual Activity   Alcohol use: Yes    Alcohol/week: 0.0 standard drinks    Comment: Very Rarely   Drug use: No   Sexual  activity: Not Currently    Birth control/protection: Surgical  Other Topics Concern   Not on file  Social History Narrative   Lives alone.   Social Determinants of Health   Financial Resource Strain: Not on file  Food Insecurity: Not on file  Transportation Needs: Not on file  Physical Activity: Not on file  Stress: Not on file  Social Connections: Not on file  Intimate Partner Violence: Not on file   Past Surgical History:  Procedure Laterality Date   ABDOMINAL HYSTERECTOMY     BUNIONECTOMY     Bilateral   CARPAL TUNNEL RELEASE Left 03/10/2015   Procedure: LEFT CARPAL TUNNEL RELEASE;  Surgeon: Daryll Brod, MD;  Location: [place];  Service: Orthopedics;  Laterality: Left;   CARPAL TUNNEL RELEASE Right 10/31/2017   Procedure: RIGHT CARPAL TUNNEL RELEASE;  Surgeon: Daryll Brod, MD;  Location: [place];  Service: Orthopedics;  Laterality: Right;   CATARACT EXTRACTION W/PHACO  05/28/2012   Procedure: CATARACT EXTRACTION PHACO AND INTRAOCULAR LENS PLACEMENT (IOC);  Surgeon: Tonny Branch, MD;  Location: [facility];  Service: Ophthalmology;  Laterality: Right;  CDE=12.84   CATARACT EXTRACTION W/PHACO  06/07/2012   Procedure: CATARACT EXTRACTION PHACO AND INTRAOCULAR LENS PLACEMENT (IOC);  Surgeon: Tonny Branch, MD;  Location: [facility];  Service: Ophthalmology;  Laterality: Left;  CDE 17.60  COLONOSCOPY     COLONOSCOPY N/A 07/08/2015   Procedure: COLONOSCOPY;  Surgeon: Rogene Houston, MD;  [facility];  Service: Endoscopy;  Laterality: N/A;  930   ESOPHAGEAL DILATION N/A 07/08/2015   Procedure: ESOPHAGEAL DILATION;  Surgeon: Rogene Houston, MD;  [facility];  Service: Endoscopy;  Laterality: N/A;   ESOPHAGOGASTRODUODENOSCOPY N/A 07/08/2015   Procedure: ESOPHAGOGASTRODUODENOSCOPY (EGD);  Surgeon: Rogene Houston, MD;  [facility];  Service: Endoscopy;  Laterality: N/A;   Hernia     Right inguinal   HERNIA REPAIR  1961   right inguinal  hernia   UPPER GASTROINTESTINAL ENDOSCOPY     Family History  Problem Relation Age of Onset   Arthritis Mother    Heart disease Mother    Cancer Father        pancreat   Pancreatic cancer Father    Arthritis Father        back issues    Cancer Brother        lung and liver   Liver cancer Brother    Lung cancer Brother    Heart disease Sister        Rheumatic Fever   Peptic Ulcer Sister    Diabetes Sister    Colon cancer Sister    Edema Sister    GI problems Sister        diverticulitis   Diabetes Sister    Neuropathy Sister    COPD Sister    Hiatal hernia Sister    GI problems Sister     Current Outpatient Medications:    acetaminophen (TYLENOL) 650 MG CR tablet, Take 650 mg by mouth every 8 (eight) hours as needed. Patient states that she takes prn for arthritis in her knees., Disp: , Rfl:    Bromelains 500 MG TABS, Take 500 mg by mouth daily., Disp: , Rfl:    Coenzyme Q10 (CO Q-10 PO), Take 1 tablet by mouth daily. With red yeast rice [employer], Disp: , Rfl:    COLLAGEN PO, Take 1 tablet by mouth daily. This is known as 1-2-3, Disp: , Rfl:    Cyanocobalamin (VITAMIN B 12 PO), Take 1,000 mcg by mouth daily., Disp: , Rfl:    Ginkgo Biloba 40 MG TABS, Take 40 mg by mouth daily., Disp: , Rfl:    glucosamine-chondroitin 500-400 MG tablet, Take 1 tablet by mouth 2 (two) times daily., Disp: , Rfl:    Hyaluronic Acid-Vitamin C (HYALURONIC ACID PO), Take 80 mg by mouth daily., Disp: , Rfl:    Mesalamine (ASACOL) 400 MG CPDR DR capsule, TAKE 2 CAPSULES DAILY, Disp: 180 capsule, Rfl: 3   metoprolol tartrate (LOPRESSOR) 25 MG tablet, Take 1 tablet (25 mg total) by mouth every morning AND 0.5 tablets (12.5 mg total) daily with lunch AND 0.5 tablets (12.5 mg total) every evening. (Patient taking differently: Take 1 tablet (25 mg total) by mouth every morning  AND 0.5 tablets (12.5 mg total) every evening.), Disp: 180 tablet, Rfl: 3   MULTIPLE VITAMINS PO, Take 1 tablet by  mouth daily. In the mornings, Disp: , Rfl:    Nutritional Supplements (GRAPESEED EXTRACT PO), Take 60 mg by mouth daily. , Disp: , Rfl:    OAT BRAN SOLUBLE PO, Take 1 tablet by mouth daily., Disp: , Rfl:    OVER THE COUNTER MEDICATION, Take 66 mg by mouth daily. Vision Gold Lutein, Disp: , Rfl:    OVER THE COUNTER MEDICATION, Take 250  mg by mouth daily. Alphalipoic Acid 250 mg daily, Disp: , Rfl:    OVER THE COUNTER MEDICATION, Ultimate Bone Support - Patient takes 1 by mouth daily. (Patient not taking: Reported on 04/07/2021), Disp: , Rfl:    pantoprazole (PROTONIX) 40 MG tablet, TAKE 1 TABLET DAILY BEFORE BREAKFAST, Disp: 90 tablet, Rfl: 3   pyridOXINE (VITAMIN B-6) 100 MG tablet, Take 100 mg by mouth daily., Disp: , Rfl:    Turmeric 500 MG CAPS, Take 1 capsule by mouth 3 (three) times daily. , Disp: , Rfl:    Vitamins C E (CRANBERRY CONCENTRATE PO), Take 2,500 mg by mouth daily., Disp: , Rfl:    Zinc 50 MG CAPS, Take 50 mg by mouth daily., Disp: , Rfl:   Allergies  Allergen Reactions   Penicillins Itching and Swelling    Patient states that she had a rash also Has patient had a PCN reaction causing immediate rash, facial/tongue/throat swelling, SOB or lightheadedness with hypotension: No Has patient had a PCN reaction causing severe rash involving mucus membranes or skin necrosis: No Has patient had a PCN reaction that required hospitalization No Has patient had a PCN reaction occurring within the last 10 years: No If all of the above answers are "NO", then may proceed with Cephalosporin use.    Influenza Virus Vacc Split Pf Rash    Per Patient she was told by her PCP not to ever take this injection again   Tape Itching     ROS: Review of Systems Pertinent items noted in HPI and remainder of comprehensive ROS otherwise negative.    Physical exam BP 118/78    Pulse 73    Temp 97.6 F (36.4 C) (Temporal)    Ht 5' (1.524 m)    Wt 129 lb (58.5 kg)    SpO2 98%    BMI 25.19 kg/m   General appearance: alert, cooperative, appears stated age, and no distress Head: Normocephalic, without obvious abnormality, atraumatic Eyes: negative findings: lids and lashes normal, conjunctivae and sclerae normal, corneas clear, and pupils equal, round, reactive to light and accomodation Ears: normal TM's and external ear canals both ears Nose: Nares normal. Septum midline. Mucosa normal. No drainage or sinus tenderness. Throat: lips, mucosa, and tongue normal; teeth and gums normal Neck: no adenopathy, supple, symmetrical, trachea midline, and thyroid not enlarged, symmetric, no tenderness/mass/nodules Back:  Thoracolumbar scoliosis appreciated with predominant right-sided recall Lungs: clear to auscultation bilaterally Heart: regular rate and rhythm, S1, S2 normal, no murmur, click, rub or gallop Abdomen: soft, non-tender; bowel sounds normal; no masses,  no organomegaly Extremities: extremities normal, atraumatic, no cyanosis or edema; arthritic changes noted to bilateral hands Pulses: 2+ and symmetric Skin:  Multiple pigmented nevi throughout the trunk Lymph nodes: Cervical, supraclavicular, and axillary nodes normal. Neurologic: Grossly normal Psych: Mood stable, speech normal  Flowsheet Row Office Visit from 05/11/2021 in [facility]  PHQ-2 Total Score 0      Assessment/ Plan: Lori Soto here for annual physical exam.   Osteopenia of neck of left femur - Plan: VITAMIN D 25 Hydroxy (Vit-D Deficiency, Fractures)  Vitamin D deficiency - Plan: VITAMIN D 25 Hydroxy (Vit-D Deficiency, Fractures)  Annual physical exam  Mixed hyperlipidemia - Plan: Lipid panel, CMP14+EGFR, TSH  Essential hypertension - Plan: CMP14+EGFR  Acute right-sided low back pain with bilateral sciatica - Plan: Ambulatory referral to Physical Therapy  Check vitamin D, CMP given history of osteopenia.  Next DEXA scan will not be due until  March 2024  She will come in for  fasting lipid.  Blood pressure is under good control.  No changes  Referral to physical therapy for her right-sided back pain which I suspect is likely secondary to change in gait in the setting of chronic scoliosis of the back and most likely leg length discrepancy.  Counseled on healthy lifestyle choices, including diet (rich in fruits, vegetables and lean meats and low in salt and simple carbohydrates) and exercise (at least 30 minutes of moderate physical activity daily).  Patient to follow up in 1 year for annual exam or sooner if needed.  Garyn Arlotta M. Lajuana Ripple, DO

## 2021-05-11 NOTE — Patient Instructions (Signed)
Come in for fasting labs at your convenience.  Don't forget to bring me the [facility] stuff.  Preventive Care 29 Years and Older, Female Preventive care refers to lifestyle choices and visits with your health care provider that can promote health and wellness. Preventive care visits are also called wellness exams. What can I expect for my preventive care visit? Counseling Your health care provider may ask you questions about your: Medical history, including: Past medical problems. Family medical history. Pregnancy and menstrual history. History of falls. Current health, including: Memory and ability to understand (cognition). Emotional well-being. Home life and relationship well-being. Sexual activity and sexual health. Lifestyle, including: Alcohol, nicotine or tobacco, and drug use. Access to firearms. Diet, exercise, and sleep habits. Work and work Statistician. Sunscreen use. Safety issues such as seatbelt and bike helmet use. Physical exam Your health care provider will check your: Height and weight. These may be used to calculate your BMI (body mass index). BMI is a measurement that tells if you are at a healthy weight. Waist circumference. This measures the distance around your waistline. This measurement also tells if you are at a healthy weight and may help predict your risk of certain diseases, such as type 2 diabetes and high blood pressure. Heart rate and blood pressure. Body temperature. Skin for abnormal spots. What immunizations do I need? Vaccines are usually given at various ages, according to a schedule. Your health care provider will recommend vaccines for you based on your age, medical history, and lifestyle or other factors, such as travel or where you work. What tests do I need? Screening Your health care provider may recommend screening tests for certain conditions. This may include: Lipid and cholesterol levels. Hepatitis C test. Hepatitis B test. HIV  (human immunodeficiency virus) test. STI (sexually transmitted infection) testing, if you are at risk. Lung cancer screening. Colorectal cancer screening. Diabetes screening. This is done by checking your blood sugar (glucose) after you have not eaten for a while (fasting). Mammogram. Talk with your health care provider about how often you should have regular mammograms. BRCA-related cancer screening. This may be done if you have a family history of breast, ovarian, tubal, or peritoneal cancers. Bone density scan. This is done to screen for osteoporosis. Talk with your health care provider about your test results, treatment options, and if necessary, the need for more tests. Follow these instructions at home: Eating and drinking  Eat a diet that includes fresh fruits and vegetables, whole grains, lean protein, and low-fat dairy products. Limit your intake of foods with high amounts of sugar, saturated fats, and salt. Take vitamin and mineral supplements as recommended by your health care provider. Do not drink alcohol if your health care provider tells you not to drink. If you drink alcohol: Limit how much you have to 0-1 drink a day. Know how much alcohol is in your drink. In the [place] one drink equals one 12 oz bottle of beer (355 mL), one 5 oz glass of wine (148 mL), or one 1 oz glass of hard liquor (44 mL). Lifestyle Brush your teeth every morning and night with fluoride toothpaste. Floss one time each day. Exercise for at least 30 minutes 5 or more days each week. Do not use any products that contain nicotine or tobacco. These products include cigarettes, chewing tobacco, and vaping devices, such as e-cigarettes. If you need help quitting, ask your health care provider. Do not use drugs. If you are sexually active, practice safe sex. Use  a condom or other form of protection in order to prevent STIs. Take aspirin only as told by your health care provider. Make sure that you understand  how much to take and what form to take. Work with your health care provider to find out whether it is safe and beneficial for you to take aspirin daily. Ask your health care provider if you need to take a cholesterol-lowering medicine (statin). Find healthy ways to manage stress, such as: Meditation, yoga, or listening to music. Journaling. Talking to a trusted person. Spending time with friends and family. Minimize exposure to UV radiation to reduce your risk of skin cancer. Safety Always wear your seat belt while driving or riding in a vehicle. Do not drive: If you have been drinking alcohol. Do not ride with someone who has been drinking. When you are tired or distracted. While texting. If you have been using any mind-altering substances or drugs. Wear a helmet and other protective equipment during sports activities. If you have firearms in your house, make sure you follow all gun safety procedures. What's next? Visit your health care provider once a year for an annual wellness visit. Ask your health care provider how often you should have your eyes and teeth checked. Stay up to date on all vaccines. This information is not intended to replace advice given to you by your health care provider. Make sure you discuss any questions you have with your health care provider. Document Revised: 10/14/2020 Document Reviewed: 10/14/2020 Elsevier Patient Education  [place].

## 2021-05-12 ENCOUNTER — Ambulatory Visit: Payer: Medicare Other | Admitting: Family Medicine

## 2021-05-17 ENCOUNTER — Other Ambulatory Visit: Payer: Medicare Other

## 2021-05-17 DIAGNOSIS — I10 Essential (primary) hypertension: Secondary | ICD-10-CM

## 2021-05-17 DIAGNOSIS — E559 Vitamin D deficiency, unspecified: Secondary | ICD-10-CM

## 2021-05-17 DIAGNOSIS — E782 Mixed hyperlipidemia: Secondary | ICD-10-CM

## 2021-05-17 DIAGNOSIS — M85852 Other specified disorders of bone density and structure, left thigh: Secondary | ICD-10-CM | POA: Diagnosis not present

## 2021-05-18 ENCOUNTER — Other Ambulatory Visit: Payer: Self-pay | Admitting: *Deleted

## 2021-05-18 ENCOUNTER — Encounter: Payer: Self-pay | Admitting: Family Medicine

## 2021-05-18 DIAGNOSIS — E875 Hyperkalemia: Secondary | ICD-10-CM

## 2021-05-18 DIAGNOSIS — Z862 Personal history of diseases of the blood and blood-forming organs and certain disorders involving the immune mechanism: Secondary | ICD-10-CM

## 2021-05-18 LAB — CMP14+EGFR
ALT: 19 IU/L (ref 0–32)
AST: 24 IU/L (ref 0–40)
Albumin/Globulin Ratio: 1.8 (ref 1.2–2.2)
Albumin: 4.2 g/dL (ref 3.6–4.6)
Alkaline Phosphatase: 34 IU/L — ABNORMAL LOW (ref 44–121)
BUN/Creatinine Ratio: 12 (ref 12–28)
BUN: 10 mg/dL (ref 8–27)
Bilirubin Total: 0.6 mg/dL (ref 0.0–1.2)
CO2: 30 mmol/L — ABNORMAL HIGH (ref 20–29)
Calcium: 9.8 mg/dL (ref 8.7–10.3)
Chloride: 104 mmol/L (ref 96–106)
Creatinine, Ser: 0.85 mg/dL (ref 0.57–1.00)
Globulin, Total: 2.4 g/dL (ref 1.5–4.5)
Glucose: 88 mg/dL (ref 70–99)
Potassium: 5.6 mmol/L — ABNORMAL HIGH (ref 3.5–5.2)
Sodium: 143 mmol/L (ref 134–144)
Total Protein: 6.6 g/dL (ref 6.0–8.5)
eGFR: 68 mL/min/{1.73_m2} (ref >59)

## 2021-05-18 LAB — LIPID PANEL
Chol/HDL Ratio: 3.4 ratio (ref 0.0–4.4)
Cholesterol, Total: 187 mg/dL (ref 100–199)
HDL: 55 mg/dL (ref >39)
LDL Chol Calc (NIH): 116 mg/dL — ABNORMAL HIGH (ref 0–99)
Triglycerides: 89 mg/dL (ref 0–149)
VLDL Cholesterol Cal: 16 mg/dL (ref 5–40)

## 2021-05-18 LAB — VITAMIN D 25 HYDROXY (VIT D DEFICIENCY, FRACTURES): Vit D, 25-Hydroxy: 48.3 ng/mL (ref 30.0–100.0)

## 2021-05-18 LAB — TSH: TSH: 1.71 u[IU]/mL (ref 0.450–4.500)

## 2021-05-20 ENCOUNTER — Ambulatory Visit: Payer: Medicare Other | Attending: Family Medicine | Admitting: Physical Therapy

## 2021-05-20 ENCOUNTER — Other Ambulatory Visit: Payer: Self-pay

## 2021-05-20 DIAGNOSIS — R293 Abnormal posture: Secondary | ICD-10-CM | POA: Diagnosis not present

## 2021-05-20 DIAGNOSIS — M5442 Lumbago with sciatica, left side: Secondary | ICD-10-CM | POA: Diagnosis not present

## 2021-05-20 DIAGNOSIS — M5441 Lumbago with sciatica, right side: Secondary | ICD-10-CM | POA: Insufficient documentation

## 2021-05-20 DIAGNOSIS — G8929 Other chronic pain: Secondary | ICD-10-CM | POA: Insufficient documentation

## 2021-05-20 NOTE — Therapy (Signed)
[facility] [place], [place], [address] Phone: [phone]   Fax:  [phone]  Physical Therapy Evaluation  Patient Details  Name: Lori Soto MRN: [id number] Date of Birth: March 25, 1936 Referring Provider (PT): Ronnie Doss DO.   Encounter Date: 05/20/2021   PT End of Session - 05/20/21 1637     Visit Number 1    Number of Visits 12    Date for PT Re-Evaluation 08/18/21    Authorization Type PROGRESS NOTE AT 10TH VISIT.  KX MODIFIER AFTER 15 VISITS.    PT Start Time 0315    PT Stop Time 0407    PT Time Calculation (min) 52 min    Activity Tolerance Patient tolerated treatment well    Behavior During Therapy WFL for tasks assessed/performed             Past Medical History:  Diagnosis Date   Anemia    Arthritis    Cancer ([place])    skin cancer on nose   Carpal tunnel syndrome, bilateral    Cataract    Esophagitis    GERD (gastroesophageal reflux disease)    H/O hiatal hernia    Osteopenia    Scoliosis    Shingles    SVT (supraventricular tachycardia) (HCC)    Ulcerative colitis     Past Surgical History:  Procedure Laterality Date   ABDOMINAL HYSTERECTOMY     BUNIONECTOMY     Bilateral   CARPAL TUNNEL RELEASE Left 03/10/2015   Procedure: LEFT CARPAL TUNNEL RELEASE;  Surgeon: Daryll Brod, MD;  Location: [place];  Service: Orthopedics;  Laterality: Left;   CARPAL TUNNEL RELEASE Right 10/31/2017   Procedure: RIGHT CARPAL TUNNEL RELEASE;  Surgeon: Daryll Brod, MD;  Location: [place];  Service: Orthopedics;  Laterality: Right;   CATARACT EXTRACTION W/PHACO  05/28/2012   Procedure: CATARACT EXTRACTION PHACO AND INTRAOCULAR LENS PLACEMENT (IOC);  Surgeon: Tonny Branch, MD;  [facility];  Service: Ophthalmology;  Laterality: Right;  CDE=12.84   CATARACT EXTRACTION W/PHACO  06/07/2012   Procedure: CATARACT EXTRACTION PHACO AND INTRAOCULAR LENS PLACEMENT (IOC);  Surgeon:  Tonny Branch, MD;  [facility];  Service: Ophthalmology;  Laterality: Left;  CDE 17.60   COLONOSCOPY     COLONOSCOPY N/A 07/08/2015   Procedure: COLONOSCOPY;  Surgeon: Rogene Houston, MD;  [facility];  Service: Endoscopy;  Laterality: N/A;  930   ESOPHAGEAL DILATION N/A 07/08/2015   Procedure: ESOPHAGEAL DILATION;  Surgeon: Rogene Houston, MD;  [facility];  Service: Endoscopy;  Laterality: N/A;   ESOPHAGOGASTRODUODENOSCOPY N/A 07/08/2015   Procedure: ESOPHAGOGASTRODUODENOSCOPY (EGD);  Surgeon: Rogene Houston, MD;  [facility];  Service: Endoscopy;  Laterality: N/A;   Hernia     Right inguinal   HERNIA REPAIR  1961   right inguinal hernia   UPPER GASTROINTESTINAL ENDOSCOPY      There were no vitals filed for this visit.    Subjective Assessment - 05/20/21 1638     Subjective COVID-19 screen performed prior to patient entering clinic.  The patient presents to the clinic today with c/o chronic right-sided low back pain.  She reports that pain will travel into her right butock and into her posterior thigh.  Her pain is a 6/10 today.  She reports some days she can be up and busy for a couple of hours and other days she is much more limited due to pain.  She states heat  helps decrease and traction in the past has been quite helpful.  The patient stated that it was also discovered that she does not have OP as she was previously told.    Pertinent History OA, Scoliosis.    How long can you sit comfortably? Unlimited.    How long can you stand comfortably? Varies.    How long can you walk comfortably? Varies.    Patient Stated Goals Get around better with less pain.    Currently in Pain? Yes    Pain Score 6     Pain Location Back    Pain Orientation Right    Pain Descriptors / Indicators Aching;Throbbing;Shooting    Pain Type Chronic pain    Pain Radiating Towards Right buttock and posterior thigh.    Pain Onset More than a month ago    Pain Frequency  Constant    Aggravating Factors  Walking/standing.    Pain Relieving Factors See above.                [facility] PT Assessment - 05/20/21 0001       Assessment   Medical Diagnosis Acute right-sided low back pain with bilateral sciatica.    Referring Provider (PT) Ronnie Doss DO.    Onset Date/Surgical Date --   Ongoing.     Restrictions   Weight Bearing Restrictions No      Balance Screen   Has the patient fallen in the past 6 months No    Has the patient had a decrease in activity level because of a fear of falling?  Yes    Is the patient reluctant to leave their home because of a fear of falling?  No      Home Environment   Living Environment Private residence      Prior Function   Level of Independence Independent      Posture/Postural Control   Posture/Postural Control Postural limitations    Postural Limitations Rounded Shoulders;Forward head;Decreased lumbar lordosis;Increased thoracic kyphosis;Flexed trunk    Posture Comments Thoracic convexity on left and lumbar convexity on right.  Bilateral genu valgum.      ROM / Strength   AROM / PROM / Strength AROM;Strength      AROM   Overall AROM Comments Full lumbar flexion and active extension is 5 degrees.      Strength   Overall Strength Comments Patient's bilateral hip abduction is 4 to 4+/5.  Bilateral knee strength essentially normal but some pain reproduction with resistance.  Normal bilateral ankle strength.      Palpation   Palpation comment Very taut and tender to palption over patient's right lumbar musculature and right SIJ.      Special Tests   Other special tests Equal leg lengths.  (-) SLR testing.  (+) right FABER test.      Ambulation/Gait   Gait Comments The patient walks in a flexed trunk posture and and tends to lean left.                        Objective measurements completed on examination: See above findings.       [facility] Adult PT Treatment/Exercise - 05/20/21 0001        Modalities   Modalities Electrical Stimulation;Moist Heat      Moist Heat Therapy   Number Minutes Moist Heat 20 Minutes    Moist Heat Location Lumbar Spine      Electrical Stimulation   Electrical Stimulation Location  Right low back.    Electrical Stimulation Action IFC at 80-150 Hz.    Electrical Stimulation Parameters 40% scan x 20 minutes.    Electrical Stimulation Goals Pain;Tone                          PT Long Term Goals - 05/20/21 1755       PT LONG TERM GOAL #1   Title Patient will be independent with an advanced HEP    Time 6    Period Weeks    Status New      PT LONG TERM GOAL #2   Title Patient will report ability to perfrom ADLs with low back pain less than or equal to 4/10    Time 6    Period Weeks    Status New      PT LONG TERM GOAL #3   Title Stand 30 minutes with pain not > 4/10.    Time 6    Period Weeks    Status New      PT LONG TERM GOAL #4   Title Walk a community distance with pain not > 4/10 and no radiation into the right buttock and posterior thigh.    Time 6    Period Weeks    Status New                    Plan - 05/20/21 1740     Clinical Impression Statement The patient presents to [facility] with chronic low back pain.  Her pain is right-sided and radiates into her right bottock and posterior thigh.  She has multiple postural abnormalites including scoliosis.  She is very tender and taut to palpation over her right lumbar musculature and SIJ.  She demonstrated a positive right FABER test.  She reports some days she is quit elimited with regards to prolonged walking and standing.  Patient will benefit from skilled physical therapy intervention to address pain and deficits.    Personal Factors and Comorbidities Other;Comorbidity 1;Age    Comorbidities OA, Scoliosis.    Examination-Activity Limitations Other;Locomotion Level;Stand    Examination-Participation Restrictions Other;Meal Prep    Stability/Clinical  Decision Making Evolving/Moderate complexity    Clinical Decision Making Low    Rehab Potential --   Good-   PT Frequency 2x / week    PT Duration 6 weeks    PT Treatment/Interventions ADLs/Self Care Home Management;Cryotherapy;Electrical Stimulation;Ultrasound;Traction;Moist Heat;Therapeutic activities;Therapeutic exercise;Manual techniques;Patient/family education;Passive range of motion;Dry needling    PT Next Visit Plan STW/M, core exercise progression, intermittment traction beginning at 60# with a max of 75#.    Consulted and Agree with Plan of Care Patient             Patient will benefit from skilled therapeutic intervention in order to improve the following deficits and impairments:  Abnormal gait, Decreased activity tolerance, Decreased range of motion, Decreased strength, Increased muscle spasms, Postural dysfunction  Visit Diagnosis: Chronic right-sided low back pain with right-sided sciatica - Plan: PT plan of care cert/re-cert  Abnormal posture - Plan: PT plan of care cert/re-cert     Problem List Patient Active Problem List   Diagnosis Date Noted   Cough 05/29/2020   Chest congestion 05/29/2020   Arthritis 06/13/2019   GERD (gastroesophageal reflux disease) 06/10/2019   Esophageal stricture 06/10/2019   Educated about COVID-19 virus infection 06/04/2019   SVT (supraventricular tachycardia) (HCC) 08/28/2018   Elevated troponin 08/28/2018   Light headedness  05/09/2018   Carpal tunnel syndrome of right wrist 05/20/2015   Primary localized osteoarthrosis, hand 05/20/2015   Carpal tunnel syndrome on left 03/18/2015   Osteopenia of the elderly 02/19/2014   DDD (degenerative disc disease), lumbar 12/03/2013   Scoliosis 12/03/2013   Chronic LBP 05/14/2013   IBS (irritable bowel syndrome) 11/08/2012   UC (ulcerative colitis) ([place]) 04/23/2012   Palpitation 10/26/2011   Hypertension 10/26/2011   Chronic ulcerative colitis ([place]) 03/23/2011    Shayla Heming, [place],  PT 05/20/2021, 5:58 PM  [facility] [address] [place], [place], [address] Phone: [phone]   Fax:  [phone]  Name: Lori Soto MRN: [id number] Date of Birth: 06-01-1935

## 2021-05-21 ENCOUNTER — Other Ambulatory Visit: Payer: Medicare Other

## 2021-05-21 DIAGNOSIS — E875 Hyperkalemia: Secondary | ICD-10-CM | POA: Diagnosis not present

## 2021-05-21 DIAGNOSIS — Z862 Personal history of diseases of the blood and blood-forming organs and certain disorders involving the immune mechanism: Secondary | ICD-10-CM | POA: Diagnosis not present

## 2021-05-22 LAB — CBC WITH DIFFERENTIAL/PLATELET
Basophils Absolute: 0 10*3/uL (ref 0.0–0.2)
Basos: 1 %
EOS (ABSOLUTE): 0.1 10*3/uL (ref 0.0–0.4)
Eos: 1 %
Hematocrit: 41.1 % (ref 34.0–46.6)
Hemoglobin: 13.5 g/dL (ref 11.1–15.9)
Immature Grans (Abs): 0 10*3/uL (ref 0.0–0.1)
Immature Granulocytes: 0 %
Lymphocytes Absolute: 1.9 10*3/uL (ref 0.7–3.1)
Lymphs: 33 %
MCH: 29.7 pg (ref 26.6–33.0)
MCHC: 32.8 g/dL (ref 31.5–35.7)
MCV: 91 fL (ref 79–97)
Monocytes Absolute: 0.5 10*3/uL (ref 0.1–0.9)
Monocytes: 8 %
Neutrophils Absolute: 3.2 10*3/uL (ref 1.4–7.0)
Neutrophils: 57 %
Platelets: 256 10*3/uL (ref 150–450)
RBC: 4.54 x10E6/uL (ref 3.77–5.28)
RDW: 13.2 % (ref 11.7–15.4)
WBC: 5.7 10*3/uL (ref 3.4–10.8)

## 2021-05-22 LAB — BMP8+EGFR
BUN/Creatinine Ratio: 15 (ref 12–28)
BUN: 13 mg/dL (ref 8–27)
CO2: 27 mmol/L (ref 20–29)
Calcium: 9.7 mg/dL (ref 8.7–10.3)
Chloride: 103 mmol/L (ref 96–106)
Creatinine, Ser: 0.85 mg/dL (ref 0.57–1.00)
Glucose: 92 mg/dL (ref 70–99)
Potassium: 5.2 mmol/L (ref 3.5–5.2)
Sodium: 142 mmol/L (ref 134–144)
eGFR: 68 mL/min/{1.73_m2} (ref >59)

## 2021-05-25 ENCOUNTER — Ambulatory Visit: Payer: Medicare Other | Admitting: Physical Therapy

## 2021-05-25 ENCOUNTER — Other Ambulatory Visit: Payer: Self-pay

## 2021-05-25 DIAGNOSIS — G8929 Other chronic pain: Secondary | ICD-10-CM

## 2021-05-25 DIAGNOSIS — M5441 Lumbago with sciatica, right side: Secondary | ICD-10-CM | POA: Diagnosis not present

## 2021-05-25 DIAGNOSIS — R293 Abnormal posture: Secondary | ICD-10-CM | POA: Diagnosis not present

## 2021-05-25 DIAGNOSIS — M5442 Lumbago with sciatica, left side: Secondary | ICD-10-CM | POA: Diagnosis not present

## 2021-05-27 ENCOUNTER — Ambulatory Visit: Payer: Medicare Other | Admitting: Physical Therapy

## 2021-05-27 ENCOUNTER — Other Ambulatory Visit: Payer: Self-pay

## 2021-05-27 DIAGNOSIS — M5441 Lumbago with sciatica, right side: Secondary | ICD-10-CM

## 2021-05-27 DIAGNOSIS — G8929 Other chronic pain: Secondary | ICD-10-CM

## 2021-05-27 DIAGNOSIS — R293 Abnormal posture: Secondary | ICD-10-CM | POA: Diagnosis not present

## 2021-05-27 DIAGNOSIS — M5442 Lumbago with sciatica, left side: Secondary | ICD-10-CM | POA: Diagnosis not present

## 2021-05-27 NOTE — Therapy (Signed)
[facility] [place], [place], [address] Phone: [phone]   Fax:  [phone]  Physical Therapy Treatment  Patient Details  Name: Lori Soto MRN: [id number] Date of Birth: Feb 14, 1936 Referring Provider (PT): Ronnie Doss DO.   Encounter Date: 05/27/2021   PT End of Session - 05/27/21 1416     Visit Number 3    Number of Visits 12    Date for PT Re-Evaluation 08/18/21    Authorization Type PROGRESS NOTE AT 10TH VISIT.  KX MODIFIER AFTER 15 VISITS.    PT Start Time 0152    PT Stop Time 0243    PT Time Calculation (min) 51 min    Activity Tolerance Patient tolerated treatment well    Behavior During Therapy WFL for tasks assessed/performed             Past Medical History:  Diagnosis Date   Anemia    Arthritis    Cancer ([place])    skin cancer on nose   Carpal tunnel syndrome, bilateral    Cataract    Esophagitis    GERD (gastroesophageal reflux disease)    H/O hiatal hernia    Osteopenia    Scoliosis    Shingles    SVT (supraventricular tachycardia) (HCC)    Ulcerative colitis     Past Surgical History:  Procedure Laterality Date   ABDOMINAL HYSTERECTOMY     BUNIONECTOMY     Bilateral   CARPAL TUNNEL RELEASE Left 03/10/2015   Procedure: LEFT CARPAL TUNNEL RELEASE;  Surgeon: Daryll Brod, MD;  Location: [place];  Service: Orthopedics;  Laterality: Left;   CARPAL TUNNEL RELEASE Right 10/31/2017   Procedure: RIGHT CARPAL TUNNEL RELEASE;  Surgeon: Daryll Brod, MD;  Location: [place];  Service: Orthopedics;  Laterality: Right;   CATARACT EXTRACTION W/PHACO  05/28/2012   Procedure: CATARACT EXTRACTION PHACO AND INTRAOCULAR LENS PLACEMENT (IOC);  Surgeon: Tonny Branch, MD;  [facility];  Service: Ophthalmology;  Laterality: Right;  CDE=12.84   CATARACT EXTRACTION W/PHACO  06/07/2012   Procedure: CATARACT EXTRACTION PHACO AND INTRAOCULAR LENS PLACEMENT (IOC);  Surgeon:  Tonny Branch, MD;  [facility];  Service: Ophthalmology;  Laterality: Left;  CDE 17.60   COLONOSCOPY     COLONOSCOPY N/A 07/08/2015   Procedure: COLONOSCOPY;  Surgeon: Rogene Houston, MD;  [facility];  Service: Endoscopy;  Laterality: N/A;  930   ESOPHAGEAL DILATION N/A 07/08/2015   Procedure: ESOPHAGEAL DILATION;  Surgeon: Rogene Houston, MD;  [facility];  Service: Endoscopy;  Laterality: N/A;   ESOPHAGOGASTRODUODENOSCOPY N/A 07/08/2015   Procedure: ESOPHAGOGASTRODUODENOSCOPY (EGD);  Surgeon: Rogene Houston, MD;  [facility];  Service: Endoscopy;  Laterality: N/A;   Hernia     Right inguinal   HERNIA REPAIR  1961   right inguinal hernia   UPPER GASTROINTESTINAL ENDOSCOPY      There were no vitals filed for this visit.   Subjective Assessment - 05/27/21 1409     Subjective COVID-19 screen performed prior to patient entering clinic.  Did fine with traction last visit.    Pertinent History OA, Scoliosis.    How long can you sit comfortably? Unlimited.    How long can you walk comfortably? Varies.    Currently in Pain? Yes  [facility] Adult PT Treatment/Exercise - 05/27/21 0001       Modalities   Modalities Electrical Stimulation;Moist Heat;Traction      Moist Heat Therapy   Number Minutes Moist Heat 15 Minutes    Moist Heat Location Lumbar Spine      Electrical Stimulation   Electrical Stimulation Location Right low back    Electrical Stimulation Action Pre-mod.    Electrical Stimulation Parameters 80-150 Hz x 15 minutes      Traction   Type of Traction Lumbar    Min (lbs) 5    Max (lbs) 70    Hold Time 99    Rest Time 5    Time 15      Manual Therapy   Manual Therapy Soft tissue mobilization    Manual therapy comments STW/M x 8 minutes to patient's right low back musculature including ischemic release technique.                          PT Long Term Goals - 05/20/21  1755       PT LONG TERM GOAL #1   Title Patient will be independent with an advanced HEP    Time 6    Period Weeks    Status New      PT LONG TERM GOAL #2   Title Patient will report ability to perfrom ADLs with low back pain less than or equal to 4/10    Time 6    Period Weeks    Status New      PT LONG TERM GOAL #3   Title Stand 30 minutes with pain not > 4/10.    Time 6    Period Weeks    Status New      PT LONG TERM GOAL #4   Title Walk a community distance with pain not > 4/10 and no radiation into the right buttock and posterior thigh.    Time 6    Period Weeks    Status New                   Plan - 05/27/21 1440     Clinical Impression Statement Patient did very well with a 10# increase in intermittment lumbar traction today.    Personal Factors and Comorbidities Other;Comorbidity 1;Age    Comorbidities OA, Scoliosis.    Examination-Activity Limitations Other;Locomotion Level;Stand    Examination-Participation Restrictions Other;Meal Prep    Stability/Clinical Decision Making Evolving/Moderate complexity    PT Frequency 2x / week    PT Duration 6 weeks    PT Treatment/Interventions ADLs/Self Care Home Management;Cryotherapy;Electrical Stimulation;Ultrasound;Traction;Moist Heat;Therapeutic activities;Therapeutic exercise;Manual techniques;Patient/family education;Passive range of motion;Dry needling    PT Next Visit Plan STW/M, core exercise progression, intermittment traction beginning at 60# with a max of 75#.    Consulted and Agree with Plan of Care Patient             Patient will benefit from skilled therapeutic intervention in order to improve the following deficits and impairments:     Visit Diagnosis: Chronic right-sided low back pain with right-sided sciatica  Abnormal posture     Problem List Patient Active Problem List   Diagnosis Date Noted   Cough 05/29/2020   Chest congestion 05/29/2020   Arthritis 06/13/2019   GERD  (gastroesophageal reflux disease) 06/10/2019   Esophageal stricture 06/10/2019   Educated about COVID-19 virus infection 06/04/2019   SVT (supraventricular tachycardia) (HCC) 08/28/2018   Elevated troponin  08/28/2018   Light headedness 05/09/2018   Carpal tunnel syndrome of right wrist 05/20/2015   Primary localized osteoarthrosis, hand 05/20/2015   Carpal tunnel syndrome on left 03/18/2015   Osteopenia of the elderly 02/19/2014   DDD (degenerative disc disease), lumbar 12/03/2013   Scoliosis 12/03/2013   Chronic LBP 05/14/2013   IBS (irritable bowel syndrome) 11/08/2012   UC (ulcerative colitis) ([place]) 04/23/2012   Palpitation 10/26/2011   Hypertension 10/26/2011   Chronic ulcerative colitis ([place]) 03/23/2011    Loreena Valeri, [place], PT 05/27/2021, 3:13 PM  [facility] [address] [place], [place], [address] Phone: [phone]   Fax:  [phone]  Name: Lori Soto MRN: [id number] Date of Birth: [birth year]

## 2021-05-27 NOTE — Therapy (Signed)
[facility] [place], [place], [address] Phone: [phone]   Fax:  [phone]  Physical Therapy Treatment  Patient Details  Name: Lori Soto MRN: [id number] Date of Birth: 1935-12-24 Referring Provider (PT): Ronnie Doss DO.   Encounter Date: 05/25/2021   PT End of Session - 05/27/21 1410     Visit Number 2    Number of Visits 12    Authorization Type PROGRESS NOTE AT 10TH VISIT.  KX MODIFIER AFTER 15 VISITS.    PT Start Time 0147    PT Stop Time 0240    PT Time Calculation (min) 53 min    Activity Tolerance Patient tolerated treatment well    Behavior During Therapy WFL for tasks assessed/performed             Past Medical History:  Diagnosis Date   Anemia    Arthritis    Cancer ([place])    skin cancer on nose   Carpal tunnel syndrome, bilateral    Cataract    Esophagitis    GERD (gastroesophageal reflux disease)    H/O hiatal hernia    Osteopenia    Scoliosis    Shingles    SVT (supraventricular tachycardia) (HCC)    Ulcerative colitis     Past Surgical History:  Procedure Laterality Date   ABDOMINAL HYSTERECTOMY     BUNIONECTOMY     Bilateral   CARPAL TUNNEL RELEASE Left 03/10/2015   Procedure: LEFT CARPAL TUNNEL RELEASE;  Surgeon: Daryll Brod, MD;  Location: [place];  Service: Orthopedics;  Laterality: Left;   CARPAL TUNNEL RELEASE Right 10/31/2017   Procedure: RIGHT CARPAL TUNNEL RELEASE;  Surgeon: Daryll Brod, MD;  Location: [place];  Service: Orthopedics;  Laterality: Right;   CATARACT EXTRACTION W/PHACO  05/28/2012   Procedure: CATARACT EXTRACTION PHACO AND INTRAOCULAR LENS PLACEMENT (IOC);  Surgeon: Tonny Branch, MD;  [facility];  Service: Ophthalmology;  Laterality: Right;  CDE=12.84   CATARACT EXTRACTION W/PHACO  06/07/2012   Procedure: CATARACT EXTRACTION PHACO AND INTRAOCULAR LENS PLACEMENT (IOC);  Surgeon: Tonny Branch, MD;  [facility];   Service: Ophthalmology;  Laterality: Left;  CDE 17.60   COLONOSCOPY     COLONOSCOPY N/A 07/08/2015   Procedure: COLONOSCOPY;  Surgeon: Rogene Houston, MD;  [facility];  Service: Endoscopy;  Laterality: N/A;  930   ESOPHAGEAL DILATION N/A 07/08/2015   Procedure: ESOPHAGEAL DILATION;  Surgeon: Rogene Houston, MD;  [facility];  Service: Endoscopy;  Laterality: N/A;   ESOPHAGOGASTRODUODENOSCOPY N/A 07/08/2015   Procedure: ESOPHAGOGASTRODUODENOSCOPY (EGD);  Surgeon: Rogene Houston, MD;  [facility];  Service: Endoscopy;  Laterality: N/A;   Hernia     Right inguinal   HERNIA REPAIR  1961   right inguinal hernia   UPPER GASTROINTESTINAL ENDOSCOPY      There were no vitals filed for this visit.   Subjective Assessment - 05/27/21 1412     Subjective No new complaints.    Pain Score 7     Pain Location Back    Pain Orientation Right    Pain Descriptors / Indicators Aching;Throbbing    Pain Type Chronic pain    Pain Onset More than a month ago              TREATMENT 05/25/21:  STW/M x 8 minutes to patient right low back to reduce pain and tone f/b HMP and IFC x 15 minutes f/b intermittent  traction at 60# with 99 sec hold and 5 sec rest.                                 PT Long Term Goals - 05/20/21 1755       PT LONG TERM GOAL #1   Title Patient will be independent with an advanced HEP    Time 6    Period Weeks    Status New      PT LONG TERM GOAL #2   Title Patient will report ability to perfrom ADLs with low back pain less than or equal to 4/10    Time 6    Period Weeks    Status New      PT LONG TERM GOAL #3   Title Stand 30 minutes with pain not > 4/10.    Time 6    Period Weeks    Status New      PT LONG TERM GOAL #4   Title Walk a community distance with pain not > 4/10 and no radiation into the right buttock and posterior thigh.    Time 6    Period Weeks    Status New                    Plan - 05/27/21 1413     Clinical Impression Statement Patient well with treatment today including traction beginning at 60#.             Patient will benefit from skilled therapeutic intervention in order to improve the following deficits and impairments:     Visit Diagnosis: Chronic right-sided low back pain with right-sided sciatica     Problem List Patient Active Problem List   Diagnosis Date Noted   Cough 05/29/2020   Chest congestion 05/29/2020   Arthritis 06/13/2019   GERD (gastroesophageal reflux disease) 06/10/2019   Esophageal stricture 06/10/2019   Educated about COVID-19 virus infection 06/04/2019   SVT (supraventricular tachycardia) (HCC) 08/28/2018   Elevated troponin 08/28/2018   Light headedness 05/09/2018   Carpal tunnel syndrome of right wrist 05/20/2015   Primary localized osteoarthrosis, hand 05/20/2015   Carpal tunnel syndrome on left 03/18/2015   Osteopenia of the elderly 02/19/2014   DDD (degenerative disc disease), lumbar 12/03/2013   Scoliosis 12/03/2013   Chronic LBP 05/14/2013   IBS (irritable bowel syndrome) 11/08/2012   UC (ulcerative colitis) ([place]) 04/23/2012   Palpitation 10/26/2011   Hypertension 10/26/2011   Chronic ulcerative colitis ([place]) 03/23/2011    Serapio Edelson, [place], PT 05/27/2021, 2:14 PM  [facility] [address] [place], [place], [address] Phone: [phone]   Fax:  [phone]  Name: Lori Soto MRN: [id number] Date of Birth: 1936/04/22

## 2021-06-01 ENCOUNTER — Other Ambulatory Visit: Payer: Self-pay

## 2021-06-01 ENCOUNTER — Ambulatory Visit: Payer: Medicare Other | Admitting: Physical Therapy

## 2021-06-01 DIAGNOSIS — R293 Abnormal posture: Secondary | ICD-10-CM

## 2021-06-01 DIAGNOSIS — M5441 Lumbago with sciatica, right side: Secondary | ICD-10-CM

## 2021-06-01 DIAGNOSIS — G8929 Other chronic pain: Secondary | ICD-10-CM | POA: Diagnosis not present

## 2021-06-01 DIAGNOSIS — M5442 Lumbago with sciatica, left side: Secondary | ICD-10-CM | POA: Diagnosis not present

## 2021-06-01 NOTE — Therapy (Signed)
[facility] [place], [place], [address] Phone: [phone]   Fax:  [phone]  Physical Therapy Treatment  Patient Details  Name: Lori Soto MRN: [id number] Date of Birth: 08-23-1935 Referring Provider (PT): Ronnie Doss DO.   Encounter Date: 06/01/2021   PT End of Session - 06/01/21 1414     Visit Number 4    Number of Visits 12    Date for PT Re-Evaluation 08/18/21    Authorization Type PROGRESS NOTE AT 10TH VISIT.  KX MODIFIER AFTER 15 VISITS.    PT Start Time 0148    PT Stop Time 0240    PT Time Calculation (min) 52 min    Activity Tolerance Patient tolerated treatment well    Behavior During Therapy WFL for tasks assessed/performed             Past Medical History:  Diagnosis Date   Anemia    Arthritis    Cancer ([place])    skin cancer on nose   Carpal tunnel syndrome, bilateral    Cataract    Esophagitis    GERD (gastroesophageal reflux disease)    H/O hiatal hernia    Osteopenia    Scoliosis    Shingles    SVT (supraventricular tachycardia) (HCC)    Ulcerative colitis     Past Surgical History:  Procedure Laterality Date   ABDOMINAL HYSTERECTOMY     BUNIONECTOMY     Bilateral   CARPAL TUNNEL RELEASE Left 03/10/2015   Procedure: LEFT CARPAL TUNNEL RELEASE;  Surgeon: Daryll Brod, MD;  Location: [facility];  Service: Orthopedics;  Laterality: Left;   CARPAL TUNNEL RELEASE Right 10/31/2017   Procedure: RIGHT CARPAL TUNNEL RELEASE;  Surgeon: Daryll Brod, MD;  Location: [facility];  Service: Orthopedics;  Laterality: Right;   CATARACT EXTRACTION W/PHACO  05/28/2012   Procedure: CATARACT EXTRACTION PHACO AND INTRAOCULAR LENS PLACEMENT (IOC);  Surgeon: Tonny Branch, MD;  [facility];  Service: Ophthalmology;  Laterality: Right;  CDE=12.84   CATARACT EXTRACTION W/PHACO  06/07/2012   Procedure: CATARACT EXTRACTION PHACO AND INTRAOCULAR LENS PLACEMENT (IOC);  Surgeon:  Tonny Branch, MD;  [facility];  Service: Ophthalmology;  Laterality: Left;  CDE 17.60   COLONOSCOPY     COLONOSCOPY N/A 07/08/2015   Procedure: COLONOSCOPY;  Surgeon: Rogene Houston, MD;  [facility];  Service: Endoscopy;  Laterality: N/A;  930   ESOPHAGEAL DILATION N/A 07/08/2015   Procedure: ESOPHAGEAL DILATION;  Surgeon: Rogene Houston, MD;  [facility];  Service: Endoscopy;  Laterality: N/A;   ESOPHAGOGASTRODUODENOSCOPY N/A 07/08/2015   Procedure: ESOPHAGOGASTRODUODENOSCOPY (EGD);  Surgeon: Rogene Houston, MD;  [facility];  Service: Endoscopy;  Laterality: N/A;   Hernia     Right inguinal   HERNIA REPAIR  1961   right inguinal hernia   UPPER GASTROINTESTINAL ENDOSCOPY      There were no vitals filed for this visit.   Subjective Assessment - 06/01/21 1413     Subjective COVID-19 screen performed prior to patient entering clinic.  Pain at an 8/10.  Been dealing with home issues (ie:  plumbing) and worked at a [employer] on [employer].    Pertinent History OA, Scoliosis.    How long can you walk comfortably? Varies.    Patient Stated Goals Get around better with less pain.    Currently in Pain? Yes    Pain Score 8  Pain Location Back    Pain Orientation Right    Pain Descriptors / Indicators Aching;Throbbing    Pain Onset More than a month ago                               [facility] Adult PT Treatment/Exercise - 06/01/21 0001       Modalities   Modalities Electrical Stimulation;Moist Heat      Moist Heat Therapy   Number Minutes Moist Heat 15 Minutes    Moist Heat Location Lumbar Spine      Electrical Stimulation   Electrical Stimulation Location RT low back    Electrical Stimulation Action IFC at 80-150 Hz.    Electrical Stimulation Parameters 40% scan x 20 minutes.    Electrical Stimulation Goals Pain;Tone      Traction   Type of Traction Lumbar    Min (lbs) 5    Max (lbs) 75    Hold Time 99     Rest Time 5    Time 15      Manual Therapy   Manual Therapy Soft tissue mobilization    Soft tissue mobilization STW/M x 8 minutes to patient's right low back including ischemic release technique to patient's right QL.                          PT Long Term Goals - 05/20/21 1755       PT LONG TERM GOAL #1   Title Patient will be independent with an advanced HEP    Time 6    Period Weeks    Status New      PT LONG TERM GOAL #2   Title Patient will report ability to perfrom ADLs with low back pain less than or equal to 4/10    Time 6    Period Weeks    Status New      PT LONG TERM GOAL #3   Title Stand 30 minutes with pain not > 4/10.    Time 6    Period Weeks    Status New      PT LONG TERM GOAL #4   Title Walk a community distance with pain not > 4/10 and no radiation into the right buttock and posterior thigh.    Time 6    Period Weeks    Status New                   Plan - 06/01/21 1459     Clinical Impression Statement The patient has been very active over the last few days and attributes this to her pain increase.  Her right QL was very tender.  She did well with 75# of intermittment lumbar traction and felt better after treatment.    Personal Factors and Comorbidities Other;Comorbidity 1;Age    Comorbidities OA, Scoliosis.    Examination-Activity Limitations Other;Locomotion Level;Stand    Examination-Participation Restrictions Other;Meal Prep    Stability/Clinical Decision Making Evolving/Moderate complexity    PT Frequency 2x / week    PT Duration 6 weeks    PT Treatment/Interventions ADLs/Self Care Home Management;Cryotherapy;Electrical Stimulation;Ultrasound;Traction;Moist Heat;Therapeutic activities;Therapeutic exercise;Manual techniques;Patient/family education;Passive range of motion;Dry needling    PT Next Visit Plan STW/M, core exercise progression, intermittment traction beginning at 60# with a max of 75#.    Consulted and Agree  with Plan of Care Patient  Patient will benefit from skilled therapeutic intervention in order to improve the following deficits and impairments:  Abnormal gait, Decreased activity tolerance, Decreased range of motion, Decreased strength, Increased muscle spasms, Postural dysfunction  Visit Diagnosis: Chronic right-sided low back pain with right-sided sciatica  Abnormal posture     Problem List Patient Active Problem List   Diagnosis Date Noted   Cough 05/29/2020   Chest congestion 05/29/2020   Arthritis 06/13/2019   GERD (gastroesophageal reflux disease) 06/10/2019   Esophageal stricture 06/10/2019   Educated about COVID-19 virus infection 06/04/2019   SVT (supraventricular tachycardia) (HCC) 08/28/2018   Elevated troponin 08/28/2018   Light headedness 05/09/2018   Carpal tunnel syndrome of right wrist 05/20/2015   Primary localized osteoarthrosis, hand 05/20/2015   Carpal tunnel syndrome on left 03/18/2015   Osteopenia of the elderly 02/19/2014   DDD (degenerative disc disease), lumbar 12/03/2013   Scoliosis 12/03/2013   Chronic LBP 05/14/2013   IBS (irritable bowel syndrome) 11/08/2012   UC (ulcerative colitis) ([place]) 04/23/2012   Palpitation 10/26/2011   Hypertension 10/26/2011   Chronic ulcerative colitis ([place]) 03/23/2011    Verner Mccrone, [place], PT 06/01/2021, 3:01 PM  [facility] [address] [place], [place], [address] Phone: [phone]   Fax:  [phone]  Name: Janyah Singleterry MRN: [id number] Date of Birth: 1936-01-22

## 2021-06-03 ENCOUNTER — Other Ambulatory Visit: Payer: Self-pay

## 2021-06-03 ENCOUNTER — Ambulatory Visit: Payer: Medicare Other | Attending: Family Medicine | Admitting: Physical Therapy

## 2021-06-03 DIAGNOSIS — M5441 Lumbago with sciatica, right side: Secondary | ICD-10-CM | POA: Diagnosis not present

## 2021-06-03 DIAGNOSIS — R293 Abnormal posture: Secondary | ICD-10-CM | POA: Insufficient documentation

## 2021-06-03 DIAGNOSIS — G8929 Other chronic pain: Secondary | ICD-10-CM | POA: Diagnosis not present

## 2021-06-03 NOTE — Therapy (Signed)
[facility] [place], [place], [address] Phone: [phone]   Fax:  [phone]  Physical Therapy Treatment  Patient Details  Name: Lori Soto MRN: [id number] Date of Birth: May 31, 1935 Referring Provider (PT): Ronnie Doss DO.   Encounter Date: 06/03/2021   PT End of Session - 06/03/21 1411     Visit Number 5    Number of Visits 12    Date for PT Re-Evaluation 08/18/21    Authorization Type PROGRESS NOTE AT 10TH VISIT.  KX MODIFIER AFTER 15 VISITS.    PT Start Time 0157   Late for treatment.   PT Stop Time 0243    PT Time Calculation (min) 46 min    Activity Tolerance Patient tolerated treatment well    Behavior During Therapy WFL for tasks assessed/performed             Past Medical History:  Diagnosis Date   Anemia    Arthritis    Cancer ([place])    skin cancer on nose   Carpal tunnel syndrome, bilateral    Cataract    Esophagitis    GERD (gastroesophageal reflux disease)    H/O hiatal hernia    Osteopenia    Scoliosis    Shingles    SVT (supraventricular tachycardia) (HCC)    Ulcerative colitis     Past Surgical History:  Procedure Laterality Date   ABDOMINAL HYSTERECTOMY     BUNIONECTOMY     Bilateral   CARPAL TUNNEL RELEASE Left 03/10/2015   Procedure: LEFT CARPAL TUNNEL RELEASE;  Surgeon: Daryll Brod, MD;  Location: [facility];  Service: Orthopedics;  Laterality: Left;   CARPAL TUNNEL RELEASE Right 10/31/2017   Procedure: RIGHT CARPAL TUNNEL RELEASE;  Surgeon: Daryll Brod, MD;  Location: [facility];  Service: Orthopedics;  Laterality: Right;   CATARACT EXTRACTION W/PHACO  05/28/2012   Procedure: CATARACT EXTRACTION PHACO AND INTRAOCULAR LENS PLACEMENT (IOC);  Surgeon: Tonny Branch, MD;  [facility];  Service: Ophthalmology;  Laterality: Right;  CDE=12.84   CATARACT EXTRACTION W/PHACO  06/07/2012   Procedure: CATARACT EXTRACTION PHACO AND INTRAOCULAR LENS  PLACEMENT (IOC);  Surgeon: Tonny Branch, MD;  Location[facility];  Service: Ophthalmology;  Laterality: Left;  CDE 17.60   COLONOSCOPY     COLONOSCOPY N/A 07/08/2015   Procedure: COLONOSCOPY;  Surgeon: Rogene Houston, MD;  [facility];  Service: Endoscopy;  Laterality: N/A;  930   ESOPHAGEAL DILATION N/A 07/08/2015   Procedure: ESOPHAGEAL DILATION;  Surgeon: Rogene Houston, MD;  [facility];  Service: Endoscopy;  Laterality: N/A;   ESOPHAGOGASTRODUODENOSCOPY N/A 07/08/2015   Procedure: ESOPHAGOGASTRODUODENOSCOPY (EGD);  Surgeon: Rogene Houston, MD;  [facility];  Service: Endoscopy;  Laterality: N/A;   Hernia     Right inguinal   HERNIA REPAIR  1961   right inguinal hernia   UPPER GASTROINTESTINAL ENDOSCOPY      There were no vitals filed for this visit.   Subjective Assessment - 06/03/21 1411     Subjective COVID-19 screen performed prior to patient entering clinic.  Felt good after last treatment and able to do more the next day with less pain.    Pertinent History OA, Scoliosis.    How long can you sit comfortably? Unlimited.    How long can you stand comfortably? Varies.    How long can you walk comfortably? Varies.    Patient Stated Goals Get around better with less pain.  Currently in Pain? Yes    Pain Score 6     Pain Location Back    Pain Orientation Right    Pain Descriptors / Indicators Aching    Pain Onset More than a month ago                               [facility] Adult PT Treatment/Exercise - 06/03/21 0001       Modalities   Modalities Electrical Stimulation;Moist Heat;Traction      Moist Heat Therapy   Number Minutes Moist Heat 15 Minutes    Moist Heat Location Lumbar Spine      Electrical Stimulation   Electrical Stimulation Location RT low back    Electrical Stimulation Action Pre-mod.    Electrical Stimulation Parameters 80-150 Hz. x 15 minutes.    Electrical Stimulation Goals Tone;Pain      Traction    Type of Traction Lumbar    Min (lbs) 5    Max (lbs) 75    Hold Time 99    Rest Time 5    Time 20                          PT Long Term Goals - 05/20/21 1755       PT LONG TERM GOAL #1   Title Patient will be independent with an advanced HEP    Time 6    Period Weeks    Status New      PT LONG TERM GOAL #2   Title Patient will report ability to perfrom ADLs with low back pain less than or equal to 4/10    Time 6    Period Weeks    Status New      PT LONG TERM GOAL #3   Title Stand 30 minutes with pain not > 4/10.    Time 6    Period Weeks    Status New      PT LONG TERM GOAL #4   Title Walk a community distance with pain not > 4/10 and no radiation into the right buttock and posterior thigh.    Time 6    Period Weeks    Status New                    Patient will benefit from skilled therapeutic intervention in order to improve the following deficits and impairments:     Visit Diagnosis: Chronic right-sided low back pain with right-sided sciatica  Abnormal posture     Problem List Patient Active Problem List   Diagnosis Date Noted   Cough 05/29/2020   Chest congestion 05/29/2020   Arthritis 06/13/2019   GERD (gastroesophageal reflux disease) 06/10/2019   Esophageal stricture 06/10/2019   Educated about COVID-19 virus infection 06/04/2019   SVT (supraventricular tachycardia) (HCC) 08/28/2018   Elevated troponin 08/28/2018   Light headedness 05/09/2018   Carpal tunnel syndrome of right wrist 05/20/2015   Primary localized osteoarthrosis, hand 05/20/2015   Carpal tunnel syndrome on left 03/18/2015   Osteopenia of the elderly 02/19/2014   DDD (degenerative disc disease), lumbar 12/03/2013   Scoliosis 12/03/2013   Chronic LBP 05/14/2013   IBS (irritable bowel syndrome) 11/08/2012   UC (ulcerative colitis) ([place]) 04/23/2012   Palpitation 10/26/2011   Hypertension 10/26/2011   Chronic ulcerative colitis ([place]) 03/23/2011     Fredrich Cory, [place], PT 06/03/2021, 3:21 PM  [facility] [place], [place], [address] Phone: [phone]   Fax:  [phone]  Name: Lori Soto MRN: [id number] Date of Birth: [birth year]

## 2021-06-07 ENCOUNTER — Other Ambulatory Visit: Payer: Self-pay

## 2021-06-07 ENCOUNTER — Ambulatory Visit: Payer: Medicare Other | Admitting: Physical Therapy

## 2021-06-07 DIAGNOSIS — G8929 Other chronic pain: Secondary | ICD-10-CM | POA: Diagnosis not present

## 2021-06-07 DIAGNOSIS — R293 Abnormal posture: Secondary | ICD-10-CM | POA: Diagnosis not present

## 2021-06-07 DIAGNOSIS — M5441 Lumbago with sciatica, right side: Secondary | ICD-10-CM | POA: Diagnosis not present

## 2021-06-07 NOTE — Therapy (Signed)
[facility] [place], [place], [address] Phone: [phone]   Fax:  [phone]  Physical Therapy Treatment  Patient Details  Name: Lori Soto MRN: [id number] Date of Birth: February 28, 1936 Referring Provider (PT): Ronnie Doss DO.   Encounter Date: 06/07/2021   PT End of Session - 06/07/21 1622     Visit Number 6    Number of Visits 12    Date for PT Re-Evaluation 08/18/21    Authorization Type PROGRESS NOTE AT 10TH VISIT.  KX MODIFIER AFTER 15 VISITS.    PT Start Time 0230    PT Stop Time 0331    PT Time Calculation (min) 61 min    Activity Tolerance Patient tolerated treatment well    Behavior During Therapy WFL for tasks assessed/performed             Past Medical History:  Diagnosis Date   Anemia    Arthritis    Cancer ([place])    skin cancer on nose   Carpal tunnel syndrome, bilateral    Cataract    Esophagitis    GERD (gastroesophageal reflux disease)    H/O hiatal hernia    Osteopenia    Scoliosis    Shingles    SVT (supraventricular tachycardia) (HCC)    Ulcerative colitis     Past Surgical History:  Procedure Laterality Date   ABDOMINAL HYSTERECTOMY     BUNIONECTOMY     Bilateral   CARPAL TUNNEL RELEASE Left 03/10/2015   Procedure: LEFT CARPAL TUNNEL RELEASE;  Surgeon: Daryll Brod, MD;  Location: [facility];  Service: Orthopedics;  Laterality: Left;   CARPAL TUNNEL RELEASE Right 10/31/2017   Procedure: RIGHT CARPAL TUNNEL RELEASE;  Surgeon: Daryll Brod, MD;  Location: [facility];  Service: Orthopedics;  Laterality: Right;   CATARACT EXTRACTION W/PHACO  05/28/2012   Procedure: CATARACT EXTRACTION PHACO AND INTRAOCULAR LENS PLACEMENT (IOC);  Surgeon: Tonny Branch, MD;  [facility];  Service: Ophthalmology;  Laterality: Right;  CDE=12.84   CATARACT EXTRACTION W/PHACO  06/07/2012   Procedure: CATARACT EXTRACTION PHACO AND INTRAOCULAR LENS PLACEMENT (IOC);  Surgeon:  Tonny Branch, MD;  [facility];  Service: Ophthalmology;  Laterality: Left;  CDE 17.60   COLONOSCOPY     COLONOSCOPY N/A 07/08/2015   Procedure: COLONOSCOPY;  Surgeon: Rogene Houston, MD;  [facility];  Service: Endoscopy;  Laterality: N/A;  930   ESOPHAGEAL DILATION N/A 07/08/2015   Procedure: ESOPHAGEAL DILATION;  Surgeon: Rogene Houston, MD;  [facility];  Service: Endoscopy;  Laterality: N/A;   ESOPHAGOGASTRODUODENOSCOPY N/A 07/08/2015   Procedure: ESOPHAGOGASTRODUODENOSCOPY (EGD);  Surgeon: Rogene Houston, MD;  [facility];  Service: Endoscopy;  Laterality: N/A;   Hernia     Right inguinal   HERNIA REPAIR  1961   right inguinal hernia   UPPER GASTROINTESTINAL ENDOSCOPY      There were no vitals filed for this visit.   Subjective Assessment - 06/07/21 1625     Subjective COVID-19 screen performed prior to patient entering clinic.  Did well after last treatment and back did quite well over the weekend.    Pertinent History OA, Scoliosis.    How long can you sit comfortably? Unlimited.    How long can you stand comfortably? Varies.    How long can you walk comfortably? Varies.    Patient Stated Goals Get around better with less pain.    Currently in Pain? Yes  Pain Score 5     Pain Location Back    Pain Orientation Right    Pain Descriptors / Indicators Aching    Pain Type Chronic pain    Pain Onset More than a month ago                               [facility] Adult PT Treatment/Exercise - 06/07/21 0001       Exercises   Exercises Knee/Hip      Knee/Hip Exercises: Aerobic   Nustep Level 3 x 8 minutes (LE's only).      Modalities   Modalities Electrical Stimulation;Moist Heat      Moist Heat Therapy   Number Minutes Moist Heat 20 Minutes    Moist Heat Location Lumbar Spine      Electrical Stimulation   Electrical Stimulation Location RT LB    Electrical Stimulation Action Pre-mod.    Electrical Stimulation  Parameters 80-150 Hz. x 20 minutes.    Electrical Stimulation Goals Tone;Pain      Traction   Type of Traction Lumbar    Min (lbs) 5    Max (lbs) 80    Hold Time 99    Rest Time 5    Time 20                          PT Long Term Goals - 05/20/21 1755       PT LONG TERM GOAL #1   Title Patient will be independent with an advanced HEP    Time 6    Period Weeks    Status New      PT LONG TERM GOAL #2   Title Patient will report ability to perfrom ADLs with low back pain less than or equal to 4/10    Time 6    Period Weeks    Status New      PT LONG TERM GOAL #3   Title Stand 30 minutes with pain not > 4/10.    Time 6    Period Weeks    Status New      PT LONG TERM GOAL #4   Title Walk a community distance with pain not > 4/10 and no radiation into the right buttock and posterior thigh.    Time 6    Period Weeks    Status New                   Plan - 06/07/21 1628     Clinical Impression Statement Very good response to last treatment and this treatment.  Pain lower since initial evaluation.  She did very well with intermittment lumbar traction at 80# and tolerated without complaint.    Personal Factors and Comorbidities Other;Comorbidity 1;Age    Comorbidities OA, Scoliosis.    Examination-Activity Limitations Other;Locomotion Level;Stand    Examination-Participation Restrictions Other;Meal Prep    Stability/Clinical Decision Making Evolving/Moderate complexity    PT Frequency 2x / week    PT Duration 6 weeks    PT Treatment/Interventions ADLs/Self Care Home Management;Cryotherapy;Electrical Stimulation;Ultrasound;Traction;Moist Heat;Therapeutic activities;Therapeutic exercise;Manual techniques;Patient/family education;Passive range of motion;Dry needling    PT Next Visit Plan STW/M, core exercise progression, intermittment traction beginning at 60# with a max of 80#.    Consulted and Agree with Plan of Care Patient              Patient will benefit from  skilled therapeutic intervention in order to improve the following deficits and impairments:  Abnormal gait, Decreased activity tolerance, Decreased range of motion, Decreased strength, Increased muscle spasms, Postural dysfunction  Visit Diagnosis: Chronic right-sided low back pain with right-sided sciatica  Abnormal posture     Problem List Patient Active Problem List   Diagnosis Date Noted   Cough 05/29/2020   Chest congestion 05/29/2020   Arthritis 06/13/2019   GERD (gastroesophageal reflux disease) 06/10/2019   Esophageal stricture 06/10/2019   Educated about COVID-19 virus infection 06/04/2019   SVT (supraventricular tachycardia) (HCC) 08/28/2018   Elevated troponin 08/28/2018   Light headedness 05/09/2018   Carpal tunnel syndrome of right wrist 05/20/2015   Primary localized osteoarthrosis, hand 05/20/2015   Carpal tunnel syndrome on left 03/18/2015   Osteopenia of the elderly 02/19/2014   DDD (degenerative disc disease), lumbar 12/03/2013   Scoliosis 12/03/2013   Chronic LBP 05/14/2013   IBS (irritable bowel syndrome) 11/08/2012   UC (ulcerative colitis) ([place]) 04/23/2012   Palpitation 10/26/2011   Hypertension 10/26/2011   Chronic ulcerative colitis ([place]) 03/23/2011    Myah Guynes, [place], PT 06/07/2021, 4:30 PM  [facility] [address] [place], [place], [address] Phone: [phone]   Fax:  [phone]  Name: Lori Soto MRN: [id number] Date of Birth: May 31, 1935

## 2021-06-11 ENCOUNTER — Other Ambulatory Visit: Payer: Self-pay

## 2021-06-11 ENCOUNTER — Ambulatory Visit: Payer: Medicare Other | Admitting: Physical Therapy

## 2021-06-11 DIAGNOSIS — G8929 Other chronic pain: Secondary | ICD-10-CM

## 2021-06-11 DIAGNOSIS — M5441 Lumbago with sciatica, right side: Secondary | ICD-10-CM

## 2021-06-11 DIAGNOSIS — R293 Abnormal posture: Secondary | ICD-10-CM | POA: Diagnosis not present

## 2021-06-11 NOTE — Therapy (Signed)
[facility], [place], [address] Phone: [phone]   Fax:  [phone]  Physical Therapy Treatment  Patient Details  Name: Lori Soto MRN: [id number] Date of Birth: 04-19-1936 Referring Provider (PT): Ronnie Doss DO.   Encounter Date: 06/11/2021   PT End of Session - 06/11/21 1248     Visit Number 7    Number of Visits 12    Date for PT Re-Evaluation 08/18/21    Authorization Type PROGRESS NOTE AT 10TH VISIT.  KX MODIFIER AFTER 15 VISITS.    PT Start Time 1115    PT Stop Time 1217    PT Time Calculation (min) 62 min    Activity Tolerance Patient tolerated treatment well    Behavior During Therapy WFL for tasks assessed/performed             Past Medical History:  Diagnosis Date   Anemia    Arthritis    Cancer ([place])    skin cancer on nose   Carpal tunnel syndrome, bilateral    Cataract    Esophagitis    GERD (gastroesophageal reflux disease)    H/O hiatal hernia    Osteopenia    Scoliosis    Shingles    SVT (supraventricular tachycardia) (HCC)    Ulcerative colitis     Past Surgical History:  Procedure Laterality Date   ABDOMINAL HYSTERECTOMY     BUNIONECTOMY     Bilateral   CARPAL TUNNEL RELEASE Left 03/10/2015   Procedure: LEFT CARPAL TUNNEL RELEASE;  Surgeon: Daryll Brod, MD;  Location: [facility];  Service: Orthopedics;  Laterality: Left;   CARPAL TUNNEL RELEASE Right 10/31/2017   Procedure: RIGHT CARPAL TUNNEL RELEASE;  Surgeon: Daryll Brod, MD;  Location: [facility];  Service: Orthopedics;  Laterality: Right;   CATARACT EXTRACTION W/PHACO  05/28/2012   Procedure: CATARACT EXTRACTION PHACO AND INTRAOCULAR LENS PLACEMENT (IOC);  Surgeon: Tonny Branch, MD;  Location: [facility];  Service: Ophthalmology;  Laterality: Right;  CDE=12.84   CATARACT EXTRACTION W/PHACO  06/07/2012   Procedure: CATARACT EXTRACTION PHACO AND INTRAOCULAR LENS PLACEMENT (IOC);  Surgeon:  Tonny Branch, MD;  Location: [facility];  Service: Ophthalmology;  Laterality: Left;  CDE 17.60   COLONOSCOPY     COLONOSCOPY N/A 07/08/2015   Procedure: COLONOSCOPY;  Surgeon: Rogene Houston, MD;  [facility];  Service: Endoscopy;  Laterality: N/A;  930   ESOPHAGEAL DILATION N/A 07/08/2015   Procedure: ESOPHAGEAL DILATION;  Surgeon: Rogene Houston, MD;  [facility];  Service: Endoscopy;  Laterality: N/A;   ESOPHAGOGASTRODUODENOSCOPY N/A 07/08/2015   Procedure: ESOPHAGOGASTRODUODENOSCOPY (EGD);  Surgeon: Rogene Houston, MD;  [facility];  Service: Endoscopy;  Laterality: N/A;   Hernia     Right inguinal   HERNIA REPAIR  1961   right inguinal hernia   UPPER GASTROINTESTINAL ENDOSCOPY      There were no vitals filed for this visit.   Subjective Assessment - 06/11/21 1250     Subjective COVID-19 screen performed prior to patient entering clinic.  Patient states she has had a good week with regards to her pain-level.    Pertinent History OA, Scoliosis.    How long can you sit comfortably? Unlimited.    How long can you stand comfortably? Varies.    How long can you walk comfortably? Varies.    Patient Stated Goals Get around better with less pain.    Currently in Pain? Yes  Pain Score 5     Pain Location Back    Pain Orientation Right    Pain Descriptors / Indicators Aching    Pain Type Chronic pain    Pain Onset More than a month ago                               [facility] Adult PT Treatment/Exercise - 06/11/21 0001       Exercises   Exercises Knee/Hip      Knee/Hip Exercises: Aerobic   Nustep Level 3 x 15 minutes.      Modalities   Modalities Electrical Stimulation;Moist Heat;Traction      Moist Heat Therapy   Number Minutes Moist Heat 20 Minutes    Moist Heat Location Lumbar Spine      Electrical Stimulation   Electrical Stimulation Location RT LB    Electrical Stimulation Action IFC at 80-150 Hz.    Electrical  Stimulation Parameters 40% scan x 20 minutes.    Electrical Stimulation Goals Tone;Pain      Traction   Type of Traction Lumbar    Min (lbs) 5    Max (lbs) 80    Hold Time 99    Rest Time 5    Time 20                          PT Long Term Goals - 05/20/21 1755       PT LONG TERM GOAL #1   Title Patient will be independent with an advanced HEP    Time 6    Period Weeks    Status New      PT LONG TERM GOAL #2   Title Patient will report ability to perfrom ADLs with low back pain less than or equal to 4/10    Time 6    Period Weeks    Status New      PT LONG TERM GOAL #3   Title Stand 30 minutes with pain not > 4/10.    Time 6    Period Weeks    Status New      PT LONG TERM GOAL #4   Title Walk a community distance with pain not > 4/10 and no radiation into the right buttock and posterior thigh.    Time 6    Period Weeks    Status New                   Plan - 06/11/21 1253     Clinical Impression Statement The patient states she was had a very good week with regards to a lowered pain-level.  She further stated she notices that when she awakes in the morning she is standing straighter.    Personal Factors and Comorbidities Other;Comorbidity 1;Age    Comorbidities OA, Scoliosis.    Examination-Activity Limitations Other;Locomotion Level;Stand    Examination-Participation Restrictions Other;Meal Prep    Stability/Clinical Decision Making Evolving/Moderate complexity    PT Frequency 2x / week    PT Duration 6 weeks    PT Treatment/Interventions ADLs/Self Care Home Management;Cryotherapy;Electrical Stimulation;Ultrasound;Traction;Moist Heat;Therapeutic activities;Therapeutic exercise;Manual techniques;Patient/family education;Passive range of motion;Dry needling    PT Next Visit Plan STW/M, core exercise progression, intermittment traction beginning at 60# with a max of 80#.    Consulted and Agree with Plan of Care Patient  Patient will benefit from skilled therapeutic intervention in order to improve the following deficits and impairments:  Abnormal gait, Decreased activity tolerance, Decreased range of motion, Decreased strength, Increased muscle spasms, Postural dysfunction  Visit Diagnosis: Chronic right-sided low back pain with right-sided sciatica  Abnormal posture     Problem List Patient Active Problem List   Diagnosis Date Noted   Cough 05/29/2020   Chest congestion 05/29/2020   Arthritis 06/13/2019   GERD (gastroesophageal reflux disease) 06/10/2019   Esophageal stricture 06/10/2019   Educated about COVID-19 virus infection 06/04/2019   SVT (supraventricular tachycardia) (HCC) 08/28/2018   Elevated troponin 08/28/2018   Light headedness 05/09/2018   Carpal tunnel syndrome of right wrist 05/20/2015   Primary localized osteoarthrosis, hand 05/20/2015   Carpal tunnel syndrome on left 03/18/2015   Osteopenia of the elderly 02/19/2014   DDD (degenerative disc disease), lumbar 12/03/2013   Scoliosis 12/03/2013   Chronic LBP 05/14/2013   IBS (irritable bowel syndrome) 11/08/2012   UC (ulcerative colitis) ([place]) 04/23/2012   Palpitation 10/26/2011   Hypertension 10/26/2011   Chronic ulcerative colitis ([place]) 03/23/2011    Kayln Garceau, [place], PT 06/11/2021, 12:55 PM  [facility] [place], [place], [address] Phone: [phone]   Fax:  [phone]  Name: Lori Soto MRN: [id number] Date of Birth: [birth year]

## 2021-06-15 ENCOUNTER — Ambulatory Visit: Payer: Medicare Other | Admitting: Physical Therapy

## 2021-06-15 ENCOUNTER — Other Ambulatory Visit: Payer: Self-pay

## 2021-06-15 DIAGNOSIS — R293 Abnormal posture: Secondary | ICD-10-CM

## 2021-06-15 DIAGNOSIS — G8929 Other chronic pain: Secondary | ICD-10-CM

## 2021-06-15 DIAGNOSIS — M5441 Lumbago with sciatica, right side: Secondary | ICD-10-CM | POA: Diagnosis not present

## 2021-06-15 NOTE — Therapy (Signed)
[facility] [place], [place], [address] Phone: [phone]   Fax:  [phone]  Physical Therapy Treatment  Patient Details  Name: Lori Soto MRN: [id number] Date of Birth: [birth year] Referring Provider (PT): Ronnie Doss DO.   Encounter Date: 06/15/2021   PT End of Session - 06/15/21 1515     Visit Number 8    Number of Visits 12    Date for PT Re-Evaluation 08/18/21    Authorization Type PROGRESS NOTE AT 10TH VISIT.  KX MODIFIER AFTER 15 VISITS.    PT Start Time 0150    PT Stop Time 0308    PT Time Calculation (min) 78 min    Activity Tolerance Patient tolerated treatment well    Behavior During Therapy WFL for tasks assessed/performed             Past Medical History:  Diagnosis Date   Anemia    Arthritis    Cancer ([place])    skin cancer on nose   Carpal tunnel syndrome, bilateral    Cataract    Esophagitis    GERD (gastroesophageal reflux disease)    H/O hiatal hernia    Osteopenia    Scoliosis    Shingles    SVT (supraventricular tachycardia) (HCC)    Ulcerative colitis     Past Surgical History:  Procedure Laterality Date   ABDOMINAL HYSTERECTOMY     BUNIONECTOMY     Bilateral   CARPAL TUNNEL RELEASE Left 03/10/2015   Procedure: LEFT CARPAL TUNNEL RELEASE;  Surgeon: Daryll Brod, MD;  Location: [facility];  Service: Orthopedics;  Laterality: Left;   CARPAL TUNNEL RELEASE Right 10/31/2017   Procedure: RIGHT CARPAL TUNNEL RELEASE;  Surgeon: Daryll Brod, MD;  Location: [facility];  Service: Orthopedics;  Laterality: Right;   CATARACT EXTRACTION W/PHACO  05/28/2012   Procedure: CATARACT EXTRACTION PHACO AND INTRAOCULAR LENS PLACEMENT (IOC);  Surgeon: Tonny Branch, MD;  [facility];  Service: Ophthalmology;  Laterality: Right;  CDE=12.84   CATARACT EXTRACTION W/PHACO  06/07/2012   Procedure: CATARACT EXTRACTION PHACO AND INTRAOCULAR LENS PLACEMENT (IOC);  Surgeon:  Tonny Branch, MD;  [facility];  Service: Ophthalmology;  Laterality: Left;  CDE 17.60   COLONOSCOPY     COLONOSCOPY N/A 07/08/2015   Procedure: COLONOSCOPY;  Surgeon: Rogene Houston, MD;  [facility];  Service: Endoscopy;  Laterality: N/A;  930   ESOPHAGEAL DILATION N/A 07/08/2015   Procedure: ESOPHAGEAL DILATION;  Surgeon: Rogene Houston, MD;  [facility];  Service: Endoscopy;  Laterality: N/A;   ESOPHAGOGASTRODUODENOSCOPY N/A 07/08/2015   Procedure: ESOPHAGOGASTRODUODENOSCOPY (EGD);  Surgeon: Rogene Houston, MD;  [facility];  Service: Endoscopy;  Laterality: N/A;   Hernia     Right inguinal   HERNIA REPAIR  1961   right inguinal hernia   UPPER GASTROINTESTINAL ENDOSCOPY      There were no vitals filed for this visit.   Subjective Assessment - 06/15/21 1515     Subjective COVID-19 screen performed prior to patient entering clinic.  Treatments are helping.  Pain around a 5 today.    Pertinent History OA, Scoliosis.    How long can you sit comfortably? Unlimited.    How long can you stand comfortably? Varies.    How long can you walk comfortably? Varies.    Patient Stated Goals Get around better with less pain.    Currently in Pain? Yes    Pain  Score 5     Pain Location Back    Pain Orientation Right    Pain Descriptors / Indicators Aching    Pain Type Chronic pain    Pain Onset More than a month ago                               [facility] Adult PT Treatment/Exercise - 06/15/21 0001       Exercises   Exercises Knee/Hip      Knee/Hip Exercises: Aerobic   Nustep LE's only on level 3 x 14 minutes.      Modalities   Modalities Electrical Stimulation;Moist Heat;Traction      Moist Heat Therapy   Number Minutes Moist Heat 20 Minutes    Moist Heat Location Lumbar Spine      Electrical Stimulation   Electrical Stimulation Location RT low back/upper gluteal region.    Electrical Stimulation Action IFC at 80-150 Hz.     Electrical Stimulation Parameters 40% scan x 20 minutes.    Electrical Stimulation Goals Tone;Pain      Traction   Type of Traction Lumbar    Min (lbs) 5    Max (lbs) 80    Hold Time 99    Rest Time 5    Time 20      Manual Therapy   Manual Therapy Soft tissue mobilization    Soft tissue mobilization Prone over pillow for comfort:  STW/M x 9 minutes to patient's right lower lumbar musculature and upper glut to reduce tone.  Utilized ischemic release technique.                          PT Long Term Goals - 05/20/21 1755       PT LONG TERM GOAL #1   Title Patient will be independent with an advanced HEP    Time 6    Period Weeks    Status New      PT LONG TERM GOAL #2   Title Patient will report ability to perfrom ADLs with low back pain less than or equal to 4/10    Time 6    Period Weeks    Status New      PT LONG TERM GOAL #3   Title Stand 30 minutes with pain not > 4/10.    Time 6    Period Weeks    Status New      PT LONG TERM GOAL #4   Title Walk a community distance with pain not > 4/10 and no radiation into the right buttock and posterior thigh.    Time 6    Period Weeks    Status New                   Plan - 06/15/21 1528     Clinical Impression Statement The patient is pleased with her progress thus far.  She has continued palpable pain in her right lower lumbar musculature and upper gluteal region.  She found soft tissue work to be very helpful.  Her treatment was concluded with intermittment traction and she stated she felt good.    Personal Factors and Comorbidities Other;Comorbidity 1;Age    Comorbidities OA, Scoliosis.    Examination-Activity Limitations Other;Locomotion Level;Stand    Examination-Participation Restrictions Other;Meal Prep    Stability/Clinical Decision Making Evolving/Moderate complexity    PT Frequency 2x / week  PT Duration 6 weeks    PT Treatment/Interventions ADLs/Self Care Home  Management;Cryotherapy;Electrical Stimulation;Ultrasound;Traction;Moist Heat;Therapeutic activities;Therapeutic exercise;Manual techniques;Patient/family education;Passive range of motion;Dry needling    PT Next Visit Plan Continue with STW/M to patient's right lower lumbar and upper gluteal musculature.    Consulted and Agree with Plan of Care Patient             Patient will benefit from skilled therapeutic intervention in order to improve the following deficits and impairments:  Abnormal gait, Decreased activity tolerance, Decreased range of motion, Decreased strength, Increased muscle spasms, Postural dysfunction  Visit Diagnosis: Chronic right-sided low back pain with right-sided sciatica  Abnormal posture     Problem List Patient Active Problem List   Diagnosis Date Noted   Cough 05/29/2020   Chest congestion 05/29/2020   Arthritis 06/13/2019   GERD (gastroesophageal reflux disease) 06/10/2019   Esophageal stricture 06/10/2019   Educated about COVID-19 virus infection 06/04/2019   SVT (supraventricular tachycardia) (HCC) 08/28/2018   Elevated troponin 08/28/2018   Light headedness 05/09/2018   Carpal tunnel syndrome of right wrist 05/20/2015   Primary localized osteoarthrosis, hand 05/20/2015   Carpal tunnel syndrome on left 03/18/2015   Osteopenia of the elderly 02/19/2014   DDD (degenerative disc disease), lumbar 12/03/2013   Scoliosis 12/03/2013   Chronic LBP 05/14/2013   IBS (irritable bowel syndrome) 11/08/2012   UC (ulcerative colitis) ([place]) 04/23/2012   Palpitation 10/26/2011   Hypertension 10/26/2011   Chronic ulcerative colitis ([place]) 03/23/2011    Duante Arocho, [place], PT 06/15/2021, 3:33 PM  [facility] [place], [place], [address] Phone: [phone]   Fax:  [phone]  Name: Lori Soto MRN: [id number] Date of Birth: 1935-05-09

## 2021-06-16 ENCOUNTER — Ambulatory Visit (INDEPENDENT_AMBULATORY_CARE_PROVIDER_SITE_OTHER): Payer: Medicare Other

## 2021-06-16 VITALS — Wt 129.0 lb

## 2021-06-16 DIAGNOSIS — Z789 Other specified health status: Secondary | ICD-10-CM | POA: Diagnosis not present

## 2021-06-16 DIAGNOSIS — Z602 Problems related to living alone: Secondary | ICD-10-CM | POA: Diagnosis not present

## 2021-06-16 DIAGNOSIS — Z0000 Encounter for general adult medical examination without abnormal findings: Secondary | ICD-10-CM | POA: Diagnosis not present

## 2021-06-16 DIAGNOSIS — Z742 Need for assistance at home and no other household member able to render care: Secondary | ICD-10-CM | POA: Diagnosis not present

## 2021-06-16 NOTE — Patient Instructions (Addendum)
Lori Soto , Thank you for taking time to come for your Medicare Wellness Visit. I appreciate your ongoing commitment to your health goals. Please review the following plan we discussed and let me know if I can assist you in the future.   Screening recommendations/referrals: Colonoscopy: Done 07/08/2015 - no repeat required Mammogram: Done 11/16/2020 - Repeat annually Bone Density: Done 07/03/2020 - Repeat every 2-3 years  Recommended yearly ophthalmology/optometry visit for glaucoma screening and checkup Recommended yearly dental visit for hygiene and checkup  Vaccinations: Influenza vaccine: Declined - allergic Pneumococcal vaccine: Done 03/20/2012 & 02/26/2014 Tdap vaccine: Done 01/23/2015 - Repeat in 10 years Shingles vaccine: Recommended 2 doses 2-6 months apart - discuss with PCP   Covid-19: Declined - several allergies  Advanced directives: We suggest all patients bring a copy of your health care power of attorney and living will to the office to be added to your chart at your convenience.   Conditions/risks identified: Aim for 30 minutes of exercise or brisk walking each day, drink 6-8 glasses of water and eat lots of fruits and vegetables.   Next appointment: Follow up in one year for your annual wellness visit    Preventive Care 65 Years and Older, Female Preventive care refers to lifestyle choices and visits with your health care provider that can promote health and wellness. What does preventive care include? A yearly physical exam. This is also called an annual well check. Dental exams once or twice a year. Routine eye exams. Ask your health care provider how often you should have your eyes checked. Personal lifestyle choices, including: Daily care of your teeth and gums. Regular physical activity. Eating a healthy diet. Avoiding tobacco and drug use. Limiting alcohol use. Practicing safe sex. Taking low-dose aspirin every day. Taking vitamin and mineral supplements as  recommended by your health care provider. What happens during an annual well check? The services and screenings done by your health care provider during your annual well check will depend on your age, overall health, lifestyle risk factors, and family history of disease. Counseling  Your health care provider may ask you questions about your: Alcohol use. Tobacco use. Drug use. Emotional well-being. Home and relationship well-being. Sexual activity. Eating habits. History of falls. Memory and ability to understand (cognition). Work and work Statistician. Reproductive health. Screening  You may have the following tests or measurements: Height, weight, and BMI. Blood pressure. Lipid and cholesterol levels. These may be checked every 5 years, or more frequently if you are over 54 years old. Skin check. Lung cancer screening. You may have this screening every year starting at age 23 if you have a 30-pack-year history of smoking and currently smoke or have quit within the past 15 years. Fecal occult blood test (FOBT) of the stool. You may have this test every year starting at age 39. Flexible sigmoidoscopy or colonoscopy. You may have a sigmoidoscopy every 5 years or a colonoscopy every 10 years starting at age 8. Hepatitis C blood test. Hepatitis B blood test. Sexually transmitted disease (STD) testing. Diabetes screening. This is done by checking your blood sugar (glucose) after you have not eaten for a while (fasting). You may have this done every 1-3 years. Bone density scan. This is done to screen for osteoporosis. You may have this done starting at age 18. Mammogram. This may be done every 1-2 years. Talk to your health care provider about how often you should have regular mammograms. Talk with your health care provider about your  test results, treatment options, and if necessary, the need for more tests. Vaccines  Your health care provider may recommend certain vaccines, such  as: Influenza vaccine. This is recommended every year. Tetanus, diphtheria, and acellular pertussis (Tdap, Td) vaccine. You may need a Td booster every 10 years. Zoster vaccine. You may need this after age 42. Pneumococcal 13-valent conjugate (PCV13) vaccine. One dose is recommended after age 44. Pneumococcal polysaccharide (PPSV23) vaccine. One dose is recommended after age 47. Talk to your health care provider about which screenings and vaccines you need and how often you need them. This information is not intended to replace advice given to you by your health care provider. Make sure you discuss any questions you have with your health care provider. Document Released: 05/15/2015 Document Revised: 01/06/2016 Document Reviewed: 02/17/2015 Elsevier Interactive Patient Education  2017 [employer] Prevention in the Home Falls can cause injuries. They can happen to people of all ages. There are many things you can do to make your home safe and to help prevent falls. What can I do on the outside of my home? Regularly fix the edges of walkways and driveways and fix any cracks. Remove anything that might make you trip as you walk through a door, such as a raised step or threshold. Trim any bushes or trees on the path to your home. Use bright outdoor lighting. Clear any walking paths of anything that might make someone trip, such as rocks or tools. Regularly check to see if handrails are loose or broken. Make sure that both sides of any steps have handrails. Any raised decks and porches should have guardrails on the edges. Have any leaves, snow, or ice cleared regularly. Use sand or salt on walking paths during winter. Clean up any spills in your garage right away. This includes oil or grease spills. What can I do in the bathroom? Use night lights. Install grab bars by the toilet and in the tub and shower. Do not use towel bars as grab bars. Use non-skid mats or decals in the tub or  shower. If you need to sit down in the shower, use a plastic, non-slip stool. Keep the floor dry. Clean up any water that spills on the floor as soon as it happens. Remove soap buildup in the tub or shower regularly. Attach bath mats securely with double-sided non-slip rug tape. Do not have throw rugs and other things on the floor that can make you trip. What can I do in the bedroom? Use night lights. Make sure that you have a light by your bed that is easy to reach. Do not use any sheets or blankets that are too big for your bed. They should not hang down onto the floor. Have a firm chair that has side arms. You can use this for support while you get dressed. Do not have throw rugs and other things on the floor that can make you trip. What can I do in the kitchen? Clean up any spills right away. Avoid walking on wet floors. Keep items that you use a lot in easy-to-reach places. If you need to reach something above you, use a strong step stool that has a grab bar. Keep electrical cords out of the way. Do not use floor polish or wax that makes floors slippery. If you must use wax, use non-skid floor wax. Do not have throw rugs and other things on the floor that can make you trip. What can I do with my  stairs? Do not leave any items on the stairs. Make sure that there are handrails on both sides of the stairs and use them. Fix handrails that are broken or loose. Make sure that handrails are as long as the stairways. Check any carpeting to make sure that it is firmly attached to the stairs. Fix any carpet that is loose or worn. Avoid having throw rugs at the top or bottom of the stairs. If you do have throw rugs, attach them to the floor with carpet tape. Make sure that you have a light switch at the top of the stairs and the bottom of the stairs. If you do not have them, ask someone to add them for you. What else can I do to help prevent falls? Wear shoes that: Do not have high heels. Have  rubber bottoms. Are comfortable and fit you well. Are closed at the toe. Do not wear sandals. If you use a stepladder: Make sure that it is fully opened. Do not climb a closed stepladder. Make sure that both sides of the stepladder are locked into place. Ask someone to hold it for you, if possible. Clearly mark and make sure that you can see: Any grab bars or handrails. First and last steps. Where the edge of each step is. Use tools that help you move around (mobility aids) if they are needed. These include: Canes. Walkers. Scooters. Crutches. Turn on the lights when you go into a dark area. Replace any light bulbs as soon as they burn out. Set up your furniture so you have a clear path. Avoid moving your furniture around. If any of your floors are uneven, fix them. If there are any pets around you, be aware of where they are. Review your medicines with your doctor. Some medicines can make you feel dizzy. This can increase your chance of falling. Ask your doctor what other things that you can do to help prevent falls. This information is not intended to replace advice given to you by your health care provider. Make sure you discuss any questions you have with your health care provider. Document Released: 02/12/2009 Document Revised: 09/24/2015 Document Reviewed: 05/23/2014 Elsevier Interactive Patient Education  2017 [employer].

## 2021-06-16 NOTE — Progress Notes (Signed)
Subjective:   Lori Soto is a 86 y.o. female who presents for Medicare Annual (Subsequent) preventive examination.  Virtual Visit via Telephone Note  I connected with  Lori Soto on 06/16/21 at  2:00 PM EST by telephone and verified that I am speaking with the correct person using two identifiers.  Location: Patient: Home Provider: [facility] Persons participating in the virtual visit: patient/Nurse Health Advisor   I discussed the limitations, risks, security and privacy concerns of performing an evaluation and management service by telephone and the availability of in person appointments. The patient expressed understanding and agreed to proceed.  Interactive audio and video telecommunications were attempted between this nurse and patient, however failed, due to patient having technical difficulties OR patient did not have access to video capability.  We continued and completed visit with audio only.  Some vital signs may be absent or patient reported.   Lori Maeda E Rogenia Werntz, LPN   Review of Systems     Cardiac Risk Factors include: advanced age (>68mn, >>13women);hypertension     Objective:    Today's Vitals   06/16/21 1403  Weight: 129 lb (58.5 kg)  PainSc: 8    Body mass index is 25.19 kg/m.  Advanced Directives 06/16/2021 05/20/2021 12/17/2020 06/15/2020 06/13/2019 08/26/2018 08/24/2018  Does Patient Have a Medical Advance Directive? No Yes No;Yes Yes Yes No Yes  Type of Advance Directive - - Living [place] Power of [place] Does patient want to make changes to medical advance directive? - - No - Patient declined No - Patient declined No - Patient declined - -  Copy of HMorganin Chart? - - - - No - copy requested No - copy requested -  Would patient like information on creating a medical advance directive? No - Patient declined - No  - Patient declined - - No - Patient declined -  Pre-existing out of facility DNR order (yellow form or pink MOST form) - - - - - - -    Current Medications (verified) Outpatient Encounter Medications as of 06/16/2021  Medication Sig   acetaminophen (TYLENOL) 650 MG CR tablet Take 650 mg by mouth every 8 (eight) hours as needed. Patient states that she takes prn for arthritis in her knees.   Bromelains 500 MG TABS Take 500 mg by mouth daily.   Coenzyme Q10 (CO Q-10 PO) Take 1 tablet by mouth daily. With red yeast rice [employer]   COLLAGEN PO Take 1 tablet by mouth daily. This is known as 1-2-3   Cyanocobalamin (VITAMIN B 12 PO) Take 1,000 mcg by mouth daily.   Ginkgo Biloba 40 MG TABS Take 40 mg by mouth daily.   glucosamine-chondroitin 500-400 MG tablet Take 1 tablet by mouth 2 (two) times daily.   Hyaluronic Acid-Vitamin C (HYALURONIC ACID PO) Take 80 mg by mouth daily.   Mesalamine (ASACOL) 400 MG CPDR DR capsule TAKE 2 CAPSULES DAILY   metoprolol tartrate (LOPRESSOR) 25 MG tablet Take 1 tablet (25 mg total) by mouth every morning AND 0.5 tablets (12.5 mg total) daily with lunch AND 0.5 tablets (12.5 mg total) every evening. (Patient taking differently: Take 1 tablet (25 mg total) by mouth every morning  AND 0.5 tablets (12.5 mg total) every evening.)   MULTIPLE VITAMINS PO Take 1 tablet by mouth daily. In the mornings   Nutritional Supplements (GRAPESEED EXTRACT PO) Take 60  mg by mouth daily.    OAT BRAN SOLUBLE PO Take 1 tablet by mouth daily.   OVER THE COUNTER MEDICATION Take 66 mg by mouth daily. Vision Gold Lutein   OVER THE COUNTER MEDICATION Take 250 mg by mouth daily. Alphalipoic Acid 250 mg daily   OVER THE COUNTER MEDICATION Ultimate Bone Support - Patient takes 1 by mouth daily.   pantoprazole (PROTONIX) 40 MG tablet TAKE 1 TABLET DAILY BEFORE BREAKFAST   pyridOXINE (VITAMIN B-6) 100 MG tablet Take 100 mg by mouth daily.   Turmeric 500 MG CAPS Take 1 capsule by mouth  3 (three) times daily.    Vitamins C E (CRANBERRY CONCENTRATE PO) Take 2,500 mg by mouth daily.   Zinc 50 MG CAPS Take 50 mg by mouth daily.   No facility-administered encounter medications on file as of 06/16/2021.    Allergies (verified) Penicillins, Influenza virus vacc split pf, and Tape   History: Past Medical History:  Diagnosis Date   Anemia    Arthritis    Cancer ([place])    skin cancer on nose   Carpal tunnel syndrome, bilateral    Cataract    Esophagitis    GERD (gastroesophageal reflux disease)    H/O hiatal hernia    Osteopenia    Scoliosis    Shingles    SVT (supraventricular tachycardia) (HCC)    Ulcerative colitis    Past Surgical History:  Procedure Laterality Date   ABDOMINAL HYSTERECTOMY     BUNIONECTOMY     Bilateral   CARPAL TUNNEL RELEASE Left 03/10/2015   Procedure: LEFT CARPAL TUNNEL RELEASE;  Surgeon: Daryll Brod, MD;  Location: [facility];  Service: Orthopedics;  Laterality: Left;   CARPAL TUNNEL RELEASE Right 10/31/2017   Procedure: RIGHT CARPAL TUNNEL RELEASE;  Surgeon: Daryll Brod, MD;  Location: [facility];  Service: Orthopedics;  Laterality: Right;   CATARACT EXTRACTION W/PHACO  05/28/2012   Procedure: CATARACT EXTRACTION PHACO AND INTRAOCULAR LENS PLACEMENT (IOC);  Surgeon: Tonny Branch, MD;  Location: [facility];  Service: Ophthalmology;  Laterality: Right;  CDE=12.84   CATARACT EXTRACTION W/PHACO  06/07/2012   Procedure: CATARACT EXTRACTION PHACO AND INTRAOCULAR LENS PLACEMENT (IOC);  Surgeon: Tonny Branch, MD;  Location: [facility];  Service: Ophthalmology;  Laterality: Left;  CDE 17.60   COLONOSCOPY     COLONOSCOPY N/A 07/08/2015   Procedure: COLONOSCOPY;  Surgeon: Rogene Houston, MD;  [facility];  Service: Endoscopy;  Laterality: N/A;  930   ESOPHAGEAL DILATION N/A 07/08/2015   Procedure: ESOPHAGEAL DILATION;  Surgeon: Rogene Houston, MD;  [facility];  Service: Endoscopy;  Laterality: N/A;    ESOPHAGOGASTRODUODENOSCOPY N/A 07/08/2015   Procedure: ESOPHAGOGASTRODUODENOSCOPY (EGD);  Surgeon: Rogene Houston, MD;  [facility];  Service: Endoscopy;  Laterality: N/A;   Hernia     Right inguinal   HERNIA REPAIR  1961   right inguinal hernia   UPPER GASTROINTESTINAL ENDOSCOPY     Family History  Problem Relation Age of Onset   Arthritis Mother    Heart disease Mother    Cancer Father        pancreat   Pancreatic cancer Father    Arthritis Father        back issues    Cancer Brother        lung and liver   Liver cancer Brother    Lung cancer Brother    Heart disease Sister  Rheumatic Fever   Peptic Ulcer Sister    Diabetes Sister    Colon cancer Sister    Edema Sister    GI problems Sister        diverticulitis   Diabetes Sister    Neuropathy Sister    COPD Sister    Hiatal hernia Sister    GI problems Sister    Social History   Socioeconomic History   Marital status: Widowed    Spouse name: Not on file   Number of children: 3   Years of education: Not on file   Highest education level: Bachelor's degree (e.g., BA, AB, BS)  Occupational History    Employer: RETIRED    Comment: accounting  Tobacco Use   Smoking status: Never   Smokeless tobacco: Never   Tobacco comments:    Never smoker  Vaping Use   Vaping Use: Never used  Substance and Sexual Activity   Alcohol use: Yes    Alcohol/week: 0.0 standard drinks    Comment: Very Rarely   Drug use: No   Sexual activity: Not Currently    Birth control/protection: Surgical  Other Topics Concern   Not on file  Social History Narrative   Lives alone in 2 story home. Still very independent   Social Determinants of Radio broadcast assistant Strain: Low Risk    Difficulty of Paying Living Expenses: Not hard at all  Food Insecurity: No Food Insecurity   Worried About Charity fundraiser in the Last Year: Never true   Ran Out of Food in the Last Year: Never true  Transportation Needs: No  Transportation Needs   Lack of Transportation (Medical): No   Lack of Transportation (Non-Medical): No  Physical Activity: Sufficiently Active   Days of Exercise per Week: 3 days   Minutes of Exercise per Session: 60 min  Stress: No Stress Concern Present   Feeling of Stress : Only a little  Social Connections: Moderately Integrated   Frequency of Communication with Friends and Family: More than three times a week   Frequency of Social Gatherings with Friends and Family: More than three times a week   Attends Religious Services: More than 4 times per year   Active Member of [employer] or Organizations: Yes   Attends Archivist Meetings: More than 4 times per year   Marital Status: Widowed    Tobacco Counseling Counseling given: Not Answered Tobacco comments: Never smoker   Clinical Intake:  Pre-visit preparation completed: Yes  Pain : 0-10 Pain Score: 8  Pain Type: Chronic pain Pain Location: Back Pain Orientation: Right, Left Pain Descriptors / Indicators: Aching, Discomfort, Sore Pain Onset: More than a month ago Pain Frequency: Intermittent     BMI - recorded: 25.19 Nutritional Status: BMI 25 -29 Overweight Nutritional Risks: None Diabetes: No  How often do you need to have someone help you when you read instructions, pamphlets, or other written materials from your doctor or pharmacy?: 1 - Never  Diabetic? no  Interpreter Needed?: No  Information entered by :: Deiontae Rabel, LPN   Activities of Daily Living In your present state of health, do you have any difficulty performing the following activities: 06/16/2021  Hearing? N  Vision? N  Difficulty concentrating or making decisions? N  Walking or climbing stairs? N  Dressing or bathing? N  Doing errands, shopping? N  Preparing Food and eating ? N  Using the Toilet? N  In the past six months, have you accidently  leaked urine? Y  Comment urgency in the mornings  Do you have problems with loss of bowel  control? Y  Comment ulcerative colitis - intermittent  Managing your Medications? N  Managing your Finances? N  Housekeeping or managing your Housekeeping? N  Some recent data might be hidden    Patient Care Team: [name], DO as PCP - General (Family Medicine) Minus Breeding, MD as PCP - Cardiology (Cardiology) Rogene Houston, MD as Consulting Physician (Gastroenterology) Gaynelle Arabian, MD as Consulting Physician (Orthopedic Surgery) Daryll Brod, MD as Consulting Physician (Orthopedic Surgery) Particia Nearing, [place] (Optometry) Ilean China, RN as Registered Nurse  Indicate any recent [place] you may have received from other than [facility] providers in the past year (date may be approximate).     Assessment:   This is a routine wellness examination for [place].  Hearing/Vision screen Hearing Screening - Comments:: Denies hearing difficulties   Vision Screening - Comments:: Wears rx glasses - up to date with routine eye exams with Rosana Hoes in [place] issues and exercise activities discussed: Current Exercise Habits: Home exercise routine, Type of exercise: walking;treadmill;stretching;strength training/weights, Time (Minutes): 60, Frequency (Times/Week): 3, Weekly Exercise (Minutes/Week): 180, Intensity: Moderate, Exercise limited by: orthopedic condition(s)   Goals Addressed             This Visit's Progress    Patient Stated       06/2021 - Would like to finish cleaning out her closet Get help cleaning out her basement (recently flooded)     Prevent falls   On track    Current Barriers:  Knowledge Deficits related to fall precautions Decreased adherence to prescribed treatment for fall prevention  Nurse Case Manager Clinical Goal(s):  Over the next 60 days, patient will demonstrate improved adherence to prescribed treatment plan for decreasing falls as evidenced by patient reporting and review of EMR Over the next 60 days, patient will verbalize  using fall risk reduction strategies discussed Over the next 90 days, patient will not experience additional falls  Interventions:  Provided written and verbal education re: Potential causes of falls and Fall prevention strategies Reviewed medications and discussed potential side effects of medications such as dizziness and frequent urination Assessed for s/s of orthostatic hypotension Assessed for falls since last encounter. Assessed patients knowledge of fall risk prevention secondary to previously provided education.  Patient Self Care Activities:  Use handrails on stairs De-clutter walkways Change positions slowly Wear secure fitting shoes at all times with ambulation Utilize home lighting for dim lit areas Have self and pet awareness at all times  Plan: CCM RN CM will follow up in 60   Initial goal documentation        Depression Screen PHQ 2/9 Scores 06/16/2021 05/11/2021 11/09/2020 07/03/2020 06/15/2020 05/05/2020 11/01/2019  PHQ - 2 Score 0 0 1 0 1 1 0  PHQ- 9 Score 0 0 2 0 2 2 0    Fall Risk Fall Risk  06/16/2021 05/11/2021 05/11/2021 11/09/2020 06/15/2020  Falls in the past year? 0 0 0 0 0  Number falls in past yr: 0 - - - -  Injury with Fall? 0 - - - -  Risk for fall due to : Orthopedic patient - - - -  Follow up Falls prevention discussed - - - -    FALL [place]:  Any stairs in or around the home? Yes  If so, are there any without handrails? No  Home  free of loose throw rugs in walkways, pet beds, electrical cords, etc? Yes  Adequate lighting in your home to reduce risk of falls? Yes   ASSISTIVE DEVICES UTILIZED TO PREVENT FALLS:  Life alert? No  Use of a cane, walker or w/c? No  Grab bars in the bathroom? Yes  Shower chair or bench in shower? No  Elevated toilet seat or a handicapped toilet? Yes   TIMED UP AND GO:  Was the test performed? No . Telephonic visit  Cognitive Function: Normal cognitive status assessed by direct  observation by this Nurse Health Advisor. No abnormalities found.   MMSE - Mini Mental State Exam 11/21/2017 11/17/2016 11/16/2015 01/23/2015  Orientation to time 5 5 5 5   Orientation to Place 5 5 5 5   Registration 3 3 3 3   Attention/ Calculation 5 5 5 4   Recall 3 3 3 3   Language- name 2 objects 2 2 2 2   Language- repeat 1 1 1 1   Language- follow 3 step command 3 3 3 2   Language- read & follow direction 1 1 1 1   Write a sentence 1 1 1 1   Copy design 1 1 1 1   Total score 30 30 30 28      6CIT Screen 06/15/2020 06/13/2019  What Year? 0 points 0 points  What month? 0 points 0 points  What time? 0 points 0 points  Count back from 20 0 points 0 points  Months in reverse 0 points 0 points  Repeat phrase 2 points 0 points  Total Score 2 0    Immunizations Immunization History  Administered Date(s) Administered   Pneumococcal Conjugate-13 02/26/2014   Pneumococcal Polysaccharide-23 03/20/2012   Td 01/23/2015   Tdap 01/23/2015    TDAP status: Up to date  Flu Vaccine status: Declined, Education has been provided regarding the importance of this vaccine but patient still declined. Advised may receive this vaccine at local pharmacy or Health Dept. Aware to provide a copy of the vaccination record if obtained from local pharmacy or Health Dept. Verbalized acceptance and understanding.  Pneumococcal vaccine status: Up to date  Covid-19 vaccine status: Declined, Education has been provided regarding the importance of this vaccine but patient still declined. Advised may receive this vaccine at local pharmacy or Health Dept.or vaccine clinic. Aware to provide a copy of the vaccination record if obtained from local pharmacy or Health Dept. Verbalized acceptance and understanding.  Qualifies for Shingles Vaccine? Yes   Zostavax completed No   Shingrix Completed?: No.    Education has been provided regarding the importance of this vaccine. Patient has been advised to call insurance company to  determine out of pocket expense if they have not yet received this vaccine. Advised may also receive vaccine at local pharmacy or Health Dept. Verbalized acceptance and understanding.  Screening Tests Health Maintenance  Topic Date Due   Zoster Vaccines- Shingrix (1 of 2) 08/09/2021 (Originally 12/13/1954)   DEXA SCAN  07/04/2022   TETANUS/TDAP  01/22/2025   Pneumonia Vaccine 71+ Years old  Completed   HPV VACCINES  Aged Out   INFLUENZA VACCINE  Discontinued   COVID-19 Vaccine  Discontinued    Health Maintenance  There are no preventive care reminders to display for this patient.  Colorectal cancer screening: No longer required.   Mammogram status: Completed 11/16/2020. Repeat every year  Bone Density status: Completed 07/03/2020. Results reflect: Bone density results: NORMAL. Repeat every 3 years.  Lung Cancer Screening: (Low Dose CT Chest recommended if Age  55-80 years, 30 pack-year currently smoking OR have quit w/in 15years.) does not qualify.   Additional Screening:  Hepatitis C Screening: does not qualify  Vision Screening: Recommended annual ophthalmology exams for early detection of glaucoma and other disorders of the eye. Is the patient up to date with their annual eye exam?  Yes  Who is the provider or what is the name of the office in which the patient attends annual eye exams? [facility] in [place] If pt is not established with a provider, would they like to be referred to a provider to establish care? No .   Dental Screening: Recommended annual dental exams for proper oral hygiene  Community Resource Referral / Chronic Care Management: CRR required this visit?  Yes   CCM required this visit?  No      Plan:     I have personally reviewed and noted the following in the patients chart:   Medical and social history Use of alcohol, tobacco or illicit drugs  Current medications and supplements including opioid prescriptions.  Functional ability and status Nutritional  status Physical activity Advanced directives List of other physicians Hospitalizations, surgeries, and ER visits in previous 12 months Vitals Screenings to include cognitive, depression, and falls Referrals and appointments  In addition, I have reviewed and discussed with patient certain preventive protocols, quality metrics, and best practice recommendations. A written personalized care plan for preventive services as well as general preventive health recommendations were provided to patient.     Sandrea Hammond, LPN   6/81/2751   Nurse Notes: Her basement just flooded - she had a pipe burst - she has filed an Production manager, but has no idea how long or if and when they'll help - CRR sent to ask if assistance is available - she has no family around and is too weak to move furniture, bend, clean, etc.

## 2021-06-16 NOTE — Addendum Note (Signed)
Addended byAdalberto Cole E on: 06/16/2021 02:50 PM   Modules accepted: Orders

## 2021-06-17 ENCOUNTER — Ambulatory Visit: Payer: Medicare Other | Admitting: Physical Therapy

## 2021-06-17 ENCOUNTER — Telehealth: Payer: Self-pay

## 2021-06-17 NOTE — Telephone Encounter (Signed)
° °  Telephone encounter was:  Successful.  06/17/2021 Name: Lori Soto MRN: [id number] DOB: 1935/11/07  Lori Soto is a 86 y.o. year old female who is a primary care patient of [name], DO . The community resource team was consulted for assistance with  home repair  Care guide performed the following interventions: Patient provided with information about care guide support team and interviewed to confirm resource needs.Patient stated she needs home repair due to a leak in her home and assistance moving damages items. I will follow up the call tomorrow because someone from the insurance is coming to talk with her  Follow Up Plan:  Care guide will follow up with patient by phone over the next day    [place], Care Management  [phone] [address], [place], [place] [address] Phone: [phone] Email: [email or web address]

## 2021-06-18 ENCOUNTER — Encounter: Payer: Medicare Other | Admitting: Physical Therapy

## 2021-06-21 ENCOUNTER — Ambulatory Visit: Payer: Medicare Other | Admitting: Physical Therapy

## 2021-06-21 ENCOUNTER — Encounter: Payer: Self-pay | Admitting: Physical Therapy

## 2021-06-21 ENCOUNTER — Other Ambulatory Visit: Payer: Self-pay

## 2021-06-21 DIAGNOSIS — M5441 Lumbago with sciatica, right side: Secondary | ICD-10-CM | POA: Diagnosis not present

## 2021-06-21 DIAGNOSIS — R293 Abnormal posture: Secondary | ICD-10-CM

## 2021-06-21 DIAGNOSIS — G8929 Other chronic pain: Secondary | ICD-10-CM

## 2021-06-21 NOTE — Therapy (Signed)
[facility] [place], [place], [address] Phone: [phone]   Fax:  [phone]  Physical Therapy Treatment  Patient Details  Name: Lori Soto MRN: [id number] Date of Birth: 11/15/1935 Referring Provider (PT): Ronnie Doss DO.   Encounter Date: 06/21/2021   PT End of Session - 06/21/21 1522     Visit Number 9    Number of Visits 12    Date for PT Re-Evaluation 08/18/21    Authorization Type PROGRESS NOTE AT 10TH VISIT.  KX MODIFIER AFTER 15 VISITS.    PT Start Time 0149    PT Stop Time 0244    PT Time Calculation (min) 55 min    Activity Tolerance Patient tolerated treatment well    Behavior During Therapy WFL for tasks assessed/performed             Past Medical History:  Diagnosis Date   Anemia    Arthritis    Cancer ([place])    skin cancer on nose   Carpal tunnel syndrome, bilateral    Cataract    Esophagitis    GERD (gastroesophageal reflux disease)    H/O hiatal hernia    Osteopenia    Scoliosis    Shingles    SVT (supraventricular tachycardia) (HCC)    Ulcerative colitis     Past Surgical History:  Procedure Laterality Date   ABDOMINAL HYSTERECTOMY     BUNIONECTOMY     Bilateral   CARPAL TUNNEL RELEASE Left 03/10/2015   Procedure: LEFT CARPAL TUNNEL RELEASE;  Surgeon: Daryll Brod, MD;  Location: [facility];  Service: Orthopedics;  Laterality: Left;   CARPAL TUNNEL RELEASE Right 10/31/2017   Procedure: RIGHT CARPAL TUNNEL RELEASE;  Surgeon: Daryll Brod, MD;  Location: [facility];  Service: Orthopedics;  Laterality: Right;   CATARACT EXTRACTION W/PHACO  05/28/2012   Procedure: CATARACT EXTRACTION PHACO AND INTRAOCULAR LENS PLACEMENT (IOC);  Surgeon: Tonny Branch, MD;  Location: [facility];  Service: Ophthalmology;  Laterality: Right;  CDE=12.84   CATARACT EXTRACTION W/PHACO  06/07/2012   Procedure: CATARACT EXTRACTION PHACO AND INTRAOCULAR LENS PLACEMENT (IOC);  Surgeon:  Tonny Branch, MD;  [facility]: [facility];  Service: Ophthalmology;  Laterality: Left;  CDE 17.60   COLONOSCOPY     COLONOSCOPY N/A 07/08/2015   Procedure: COLONOSCOPY;  Surgeon: Rogene Houston, MD;  [facility];  Service: Endoscopy;  Laterality: N/A;  930   ESOPHAGEAL DILATION N/A 07/08/2015   Procedure: ESOPHAGEAL DILATION;  Surgeon: Rogene Houston, MD;  [facility];  Service: Endoscopy;  Laterality: N/A;   ESOPHAGOGASTRODUODENOSCOPY N/A 07/08/2015   Procedure: ESOPHAGOGASTRODUODENOSCOPY (EGD);  Surgeon: Rogene Houston, MD;  [facility];  Service: Endoscopy;  Laterality: N/A;   Hernia     Right inguinal   HERNIA REPAIR  1961   right inguinal hernia   UPPER GASTROINTESTINAL ENDOSCOPY      There were no vitals filed for this visit.   Subjective Assessment - 06/21/21 1423     Subjective Back doing good today with a pain-level at 3/10.  Knees hurting today, been up and doing a lot.    Pertinent History OA, Scoliosis.    How long can you sit comfortably? Unlimited.    How long can you stand comfortably? Varies.    How long can you walk comfortably? Varies.    Patient Stated Goals Get around better with less pain.    Currently in Pain? Yes  Pain Score 3     Pain Location Back    Pain Orientation Right;Left    Pain Descriptors / Indicators Aching;Discomfort;Sore    Pain Type Chronic pain    Pain Onset More than a month ago                               [facility] Adult PT Treatment/Exercise - 06/21/21 0001       Exercises   Exercises Knee/Hip      Knee/Hip Exercises: Aerobic   Nustep Level 3 x 11 minutes.      Acupuncturist Location RT LB    Electrical Stimulation Action IFC at 80-150 Hz.    Electrical Stimulation Parameters 15 minutes on 40% scan x 15 minutes.    Electrical Stimulation Goals Tone;Pain      Traction   Type of Traction Lumbar    Min (lbs) 5    Max (lbs) 80    Hold Time 99     Rest Time 5    Time 20      Manual Therapy   Manual Therapy Soft tissue mobilization    Soft tissue mobilization STW/M x 6 minutes to patient's right low back with ischemic release technique utilized.                          PT Long Term Goals - 05/20/21 1755       PT LONG TERM GOAL #1   Title Patient will be independent with an advanced HEP    Time 6    Period Weeks    Status New      PT LONG TERM GOAL #2   Title Patient will report ability to perfrom ADLs with low back pain less than or equal to 4/10    Time 6    Period Weeks    Status New      PT LONG TERM GOAL #3   Title Stand 30 minutes with pain not > 4/10.    Time 6    Period Weeks    Status New      PT LONG TERM GOAL #4   Title Walk a community distance with pain not > 4/10 and no radiation into the right buttock and posterior thigh.    Time 6    Period Weeks    Status New                   Plan - 06/21/21 1519     Clinical Impression Statement Patient very pleaaed with her response to therapy thus far.  Her CC continued to be right sided low back.  She has found STW/M helpful this area.    Personal Factors and Comorbidities Other;Comorbidity 1;Age    Comorbidities OA, Scoliosis.    Examination-Activity Limitations Other;Locomotion Level;Stand    Examination-Participation Restrictions Other;Meal Prep    Stability/Clinical Decision Making Evolving/Moderate complexity    PT Frequency 2x / week    PT Duration 6 weeks    PT Treatment/Interventions ADLs/Self Care Home Management;Cryotherapy;Electrical Stimulation;Ultrasound;Traction;Moist Heat;Therapeutic activities;Therapeutic exercise;Manual techniques;Patient/family education;Passive range of motion;Dry needling    PT Next Visit Plan Continue with STW/M to patient's right lower lumbar and upper gluteal musculature.    Consulted and Agree with Plan of Care Patient             Patient will benefit from skilled therapeutic  intervention in order to improve the following deficits and impairments:  Abnormal gait, Decreased activity tolerance, Decreased range of motion, Decreased strength, Increased muscle spasms, Postural dysfunction  Visit Diagnosis: Chronic right-sided low back pain with right-sided sciatica  Abnormal posture     Problem List Patient Active Problem List   Diagnosis Date Noted   Cough 05/29/2020   Chest congestion 05/29/2020   Arthritis 06/13/2019   GERD (gastroesophageal reflux disease) 06/10/2019   Esophageal stricture 06/10/2019   Educated about COVID-19 virus infection 06/04/2019   SVT (supraventricular tachycardia) (HCC) 08/28/2018   Elevated troponin 08/28/2018   Light headedness 05/09/2018   Carpal tunnel syndrome of right wrist 05/20/2015   Primary localized osteoarthrosis, hand 05/20/2015   Carpal tunnel syndrome on left 03/18/2015   Osteopenia of the elderly 02/19/2014   DDD (degenerative disc disease), lumbar 12/03/2013   Scoliosis 12/03/2013   Chronic LBP 05/14/2013   IBS (irritable bowel syndrome) 11/08/2012   UC (ulcerative colitis) ([place]) 04/23/2012   Palpitation 10/26/2011   Hypertension 10/26/2011   Chronic ulcerative colitis ([place]) 03/23/2011    Tamarion Haymond, [place], PT 06/21/2021, 3:31 PM  [facility] [address] [place], [place], [address] Phone: [phone]   Fax:  [phone]  Name: Lori Soto MRN: [id number] Date of Birth: 10-Jul-1935

## 2021-06-22 ENCOUNTER — Telehealth: Payer: Self-pay

## 2021-06-22 NOTE — Telephone Encounter (Signed)
° °  Telephone encounter was:  Successful.  06/22/2021 Name: [name] MRN: [id number] DOB: 1935-05-24  Lori Soto is a 86 y.o. year old female who is a primary care patient of [name], DO . The community resource team was consulted for assistance with Home Modifications  Care guide performed the following interventions: Patient provided with information about care guide support team and interviewed to confirm resource needs.Followed up with patient and her Insurance company has not been back you to her home yet for the repairs. I did sent resources in the mail and will follow up in a week  Follow Up Plan:   I will follow up in a week    [facility]  [phone] [address], [place], [place] [address] Phone: [phone] Email: [email or web address]

## 2021-06-25 ENCOUNTER — Ambulatory Visit: Payer: Medicare Other | Admitting: Physical Therapy

## 2021-06-28 ENCOUNTER — Ambulatory Visit: Payer: Medicare Other | Admitting: Physical Therapy

## 2021-06-28 ENCOUNTER — Other Ambulatory Visit: Payer: Self-pay

## 2021-06-28 DIAGNOSIS — R293 Abnormal posture: Secondary | ICD-10-CM | POA: Diagnosis not present

## 2021-06-28 DIAGNOSIS — G8929 Other chronic pain: Secondary | ICD-10-CM | POA: Diagnosis not present

## 2021-06-28 DIAGNOSIS — M5441 Lumbago with sciatica, right side: Secondary | ICD-10-CM | POA: Diagnosis not present

## 2021-06-28 NOTE — Therapy (Signed)
[facility] [place], [place], [address] Phone: [phone]   Fax:  [phone]  Physical Therapy Treatment  Patient Details  Name: Lori Soto MRN: [id number] Date of Birth: 1935/07/06 Referring Provider (PT): Ronnie Doss DO.   Encounter Date: 06/28/2021   PT End of Session - 06/28/21 1454     Visit Number 10    Number of Visits 12    Date for PT Re-Evaluation 08/18/21    Authorization Type PROGRESS NOTE AT 10TH VISIT.  KX MODIFIER AFTER 15 VISITS.    PT Start Time 0149    PT Stop Time 0302    PT Time Calculation (min) 73 min    Activity Tolerance Patient tolerated treatment well             Past Medical History:  Diagnosis Date   Anemia    Arthritis    Cancer ([place])    skin cancer on nose   Carpal tunnel syndrome, bilateral    Cataract    Esophagitis    GERD (gastroesophageal reflux disease)    H/O hiatal hernia    Osteopenia    Scoliosis    Shingles    SVT (supraventricular tachycardia) (HCC)    Ulcerative colitis     Past Surgical History:  Procedure Laterality Date   ABDOMINAL HYSTERECTOMY     BUNIONECTOMY     Bilateral   CARPAL TUNNEL RELEASE Left 03/10/2015   Procedure: LEFT CARPAL TUNNEL RELEASE;  Surgeon: Daryll Brod, MD;  Location: [facility];  Service: Orthopedics;  Laterality: Left;   CARPAL TUNNEL RELEASE Right 10/31/2017   Procedure: RIGHT CARPAL TUNNEL RELEASE;  Surgeon: Daryll Brod, MD;  Location: [facility];  Service: Orthopedics;  Laterality: Right;   CATARACT EXTRACTION W/PHACO  05/28/2012   Procedure: CATARACT EXTRACTION PHACO AND INTRAOCULAR LENS PLACEMENT (IOC);  Surgeon: Tonny Branch, MD;  [facility];  Service: Ophthalmology;  Laterality: Right;  CDE=12.84   CATARACT EXTRACTION W/PHACO  06/07/2012   Procedure: CATARACT EXTRACTION PHACO AND INTRAOCULAR LENS PLACEMENT (IOC);  Surgeon: Tonny Branch, MD;  [facility];  Service: Ophthalmology;   Laterality: Left;  CDE 17.60   COLONOSCOPY     COLONOSCOPY N/A 07/08/2015   Procedure: COLONOSCOPY;  Surgeon: Rogene Houston, MD;  [facility];  Service: Endoscopy;  Laterality: N/A;  930   ESOPHAGEAL DILATION N/A 07/08/2015   Procedure: ESOPHAGEAL DILATION;  Surgeon: Rogene Houston, MD;  [facility];  Service: Endoscopy;  Laterality: N/A;   ESOPHAGOGASTRODUODENOSCOPY N/A 07/08/2015   Procedure: ESOPHAGOGASTRODUODENOSCOPY (EGD);  Surgeon: Rogene Houston, MD;  [facility];  Service: Endoscopy;  Laterality: N/A;   Hernia     Right inguinal   HERNIA REPAIR  1961   right inguinal hernia   UPPER GASTROINTESTINAL ENDOSCOPY      There were no vitals filed for this visit.   Subjective Assessment - 06/28/21 1433     Subjective COVID-19 screen performed prior to patient entering clinic.  Pain low today.    Pertinent History OA, Scoliosis.    How long can you sit comfortably? Unlimited.    How long can you walk comfortably? Varies.    Patient Stated Goals Get around better with less pain.    Currently in Pain? Yes    Pain Score 2     Pain Location Back    Pain Orientation Right    Pain Descriptors / Indicators Aching;Dull  Pain Type Chronic pain    Pain Onset More than a month ago                               [facility] Adult PT Treatment/Exercise - 06/28/21 0001       Exercises   Exercises Knee/Hip;Lumbar      Lumbar Exercises: Machines for Strengthening   Cybex Lumbar Extension 60# x 2 minutes.    Other Lumbar Machine Exercise Ab curls with 60# x 2 minutes.      Knee/Hip Exercises: Aerobic   Nustep Level 2 x 12 minutes.      Modalities   Modalities Teacher, English as a foreign language Location RT LB    Electrical Stimulation Action IFC at 80-150 Hz.at 40% scan x 15 minutes.    Electrical Stimulation Goals Tone;Pain      Traction   Type of Traction Lumbar    Min (lbs) 5    Max  (lbs) 80    Hold Time 99    Rest Time 5    Time 20      Manual Therapy   Manual Therapy Soft tissue mobilization    Soft tissue mobilization STW/M x 8 minutes to patient's right low back x 8 minutes including ischemic release technique to patient's right QL.                          PT Long Term Goals - 06/28/21 1455       PT LONG TERM GOAL #1   Title Patient will be independent with an advanced HEP    Baseline met 09/05/19    Time 6    Period Weeks    Status On-going      PT LONG TERM GOAL #2   Title Patient will report ability to perfrom ADLs with low back pain less than or equal to 4/10    Time 6    Period Weeks    Status Partially Met      PT LONG TERM GOAL #3   Title Stand 30 minutes with pain not > 4/10.    Time 6    Period Weeks    Status Partially Met      PT LONG TERM GOAL #4   Title Walk a community distance with pain not > 4/10 and no radiation into the right buttock and posterior thigh.    Time 6    Period Weeks    Status Partially Met                   Plan - 06/28/21 1450     Clinical Impression Statement The patient presented to the clinic with a low pain-level.  Added back extension and ab curl machine which she performed without complaint.  Continued palpable tenderness in her right low back that responded well to soft tissue work.    Personal Factors and Comorbidities Other;Comorbidity 1;Age    Comorbidities OA, Scoliosis.    Examination-Activity Limitations Other;Locomotion Level;Stand    Examination-Participation Restrictions Other;Meal Prep    Stability/Clinical Decision Making Evolving/Moderate complexity    PT Treatment/Interventions ADLs/Self Care Home Management;Cryotherapy;Electrical Stimulation;Ultrasound;Traction;Moist Heat;Therapeutic activities;Therapeutic exercise;Manual techniques;Patient/family education;Passive range of motion;Dry needling    PT Next Visit Plan Patient going to try reducing to once a week to  see how she does.  Continue with POC.    Consulted  and Agree with Plan of Care Patient             Patient will benefit from skilled therapeutic intervention in order to improve the following deficits and impairments:  Abnormal gait, Decreased activity tolerance, Decreased range of motion, Decreased strength, Increased muscle spasms, Postural dysfunction  Visit Diagnosis: Chronic right-sided low back pain with right-sided sciatica  Abnormal posture     Problem List Patient Active Problem List   Diagnosis Date Noted   Cough 05/29/2020   Chest congestion 05/29/2020   Arthritis 06/13/2019   GERD (gastroesophageal reflux disease) 06/10/2019   Esophageal stricture 06/10/2019   Educated about COVID-19 virus infection 06/04/2019   SVT (supraventricular tachycardia) (HCC) 08/28/2018   Elevated troponin 08/28/2018   Light headedness 05/09/2018   Carpal tunnel syndrome of right wrist 05/20/2015   Primary localized osteoarthrosis, hand 05/20/2015   Carpal tunnel syndrome on left 03/18/2015   Osteopenia of the elderly 02/19/2014   DDD (degenerative disc disease), lumbar 12/03/2013   Scoliosis 12/03/2013   Chronic LBP 05/14/2013   IBS (irritable bowel syndrome) 11/08/2012   UC (ulcerative colitis) ([place]) 04/23/2012   Palpitation 10/26/2011   Hypertension 10/26/2011   Chronic ulcerative colitis ([place]) 03/23/2011   Progress Note Reporting Period 05/20/21 to 06/28/21  See note below for Objective Data and Assessment of Progress/Goals. Patient is progressing well and responding favorably to treatments.  She presented to the clinic with a low pain-level today.    Tymon Nemetz, [place], PT 06/28/2021, 3:14 PM  [facility] [address] [place], [place], [address] Phone: [phone]   Fax:  [phone]  Name: Taylee Gunnells MRN: [id number] Date of Birth: [birth year]

## 2021-06-29 ENCOUNTER — Encounter (INDEPENDENT_AMBULATORY_CARE_PROVIDER_SITE_OTHER): Payer: Self-pay | Admitting: Internal Medicine

## 2021-06-29 ENCOUNTER — Ambulatory Visit (INDEPENDENT_AMBULATORY_CARE_PROVIDER_SITE_OTHER): Payer: Medicare Other | Admitting: Internal Medicine

## 2021-06-29 VITALS — BP 118/74 | HR 62 | Temp 98.2°F | Ht 60.0 in | Wt 129.1 lb

## 2021-06-29 DIAGNOSIS — K219 Gastro-esophageal reflux disease without esophagitis: Secondary | ICD-10-CM

## 2021-06-29 DIAGNOSIS — K5100 Ulcerative (chronic) pancolitis without complications: Secondary | ICD-10-CM | POA: Diagnosis not present

## 2021-06-29 MED ORDER — PANTOPRAZOLE SODIUM 20 MG PO TBEC: 20.0000 mg | DELAYED_RELEASE_TABLET | Freq: Every day | ORAL | 3 refills | Status: DC

## 2021-06-29 NOTE — Progress Notes (Signed)
Presenting complaint;  Follow-up for ulcerative colitis and chronic GERD.  Database and subjective:  Patient 86 year old Caucasian female who has chronic GERD complicated by esophageal stricture last dilated in March 2017 maintained on dietary measures and PPI as well as several year history of ulcerative colitis in remission on low-dose oral mesalamine who is here for scheduled visit.  She was last seen 6 months ago. She says she is doing well from GI standpoint.  She did well with pantoprazole every other day.  Then she decided to break the pill and take 20 mg daily but very quickly realized it was not working.  She says she looked up the instructions and realized she was not supposed to cut the pill.  She is back to taking it every other day and doing fine.  She denies dysphagia nausea vomiting abdominal pain melena or rectal bleeding.  Her bowels once or twice daily.  She may occasionally skip a day. She says she is doing much better as far as back pain is concerned.  She receiving traction therapy once a week.  She has had 4 treatments.  She also goes to [employer] 2-3 times a week.  Current Medications: Outpatient Encounter Medications as of 06/29/2021  Medication Sig   acetaminophen (TYLENOL) 650 MG CR tablet Take 650 mg by mouth every 8 (eight) hours as needed. Patient states that she takes prn for arthritis in her knees.   Bromelains 500 MG TABS Take 500 mg by mouth daily.   Coenzyme Q10 (CO Q-10 PO) Take 1 tablet by mouth daily. With red yeast rice [employer]   COLLAGEN PO Take 1 tablet by mouth daily. This is known as 1-2-3   Cyanocobalamin (VITAMIN B 12 PO) Take 1,000 mcg by mouth daily.   Ginkgo Biloba 40 MG TABS Take 40 mg by mouth daily.   glucosamine-chondroitin 500-400 MG tablet Take 1 tablet by mouth 2 (two) times daily.   Hyaluronic Acid-Vitamin C (HYALURONIC ACID PO) Take 80 mg by mouth daily.   Mesalamine (ASACOL) 400 MG CPDR DR capsule TAKE 2 CAPSULES DAILY    metoprolol tartrate (LOPRESSOR) 25 MG tablet Take 1 tablet (25 mg total) by mouth every morning AND 0.5 tablets (12.5 mg total) daily with lunch AND 0.5 tablets (12.5 mg total) every evening. (Patient taking differently: Take 1 tablet (25 mg total) by mouth every morning  AND 0.5 tablets (12.5 mg total) every evening.)   MULTIPLE VITAMINS PO Take 1 tablet by mouth daily. In the mornings   Nutritional Supplements (GRAPESEED EXTRACT PO) Take 60 mg by mouth daily.    OAT BRAN SOLUBLE PO Take 1 tablet by mouth daily.   OVER THE COUNTER MEDICATION Take 66 mg by mouth daily. Vision Gold Lutein   OVER THE COUNTER MEDICATION Take 250 mg by mouth daily. Alphalipoic Acid 250 mg daily   pantoprazole (PROTONIX) 40 MG tablet TAKE 1 TABLET DAILY BEFORE BREAKFAST   pyridOXINE (VITAMIN B-6) 100 MG tablet Take 100 mg by mouth daily.   Turmeric 500 MG CAPS Take 1 capsule by mouth 3 (three) times daily.    Vitamins C E (CRANBERRY CONCENTRATE PO) Take 2,500 mg by mouth daily.   Zinc 50 MG CAPS Take 50 mg by mouth daily.   [DISCONTINUED] OVER THE COUNTER MEDICATION Ultimate Bone Support - Patient takes 1 by mouth daily.   No facility-administered encounter medications on file as of 06/29/2021.     Objective: Blood pressure 118/74, pulse 62, temperature 98.2 F (36.8  C), temperature source Oral, height 5' (1.524 m), weight 129 lb 1.6 oz (58.6 kg). Patient is alert and in no acute distress. Conjunctiva is pink. Sclera is nonicteric Oropharyngeal mucosa is normal. No neck masses or thyromegaly noted. Cardiac exam with regular rhythm normal S1 and S2. No murmur or gallop noted. Lungs are clear to auscultation. Abdomen is symmetrical soft and nontender with organomegaly or masses. No LE edema or clubbing noted.  Labs/studies Results:   CBC Latest Ref Rng & Units 05/21/2021 12/17/2020 10/29/2019  WBC 3.4 - 10.8 x10E3/uL 5.7 7.2 5.3  Hemoglobin 11.1 - 15.9 g/dL 13.5 13.8 13.9  Hematocrit 34.0 - 46.6 % 41.1 42.9  41.7  Platelets 150 - 450 x10E3/uL 256 196 233    CMP Latest Ref Rng & Units 05/21/2021 05/17/2021 12/17/2020  Glucose 70 - 99 mg/dL 92 88 119(H)  BUN 8 - 27 mg/dL _0 Creatinine 0.57 - 1.00 mg/dL 0.85 0.85 0.83  Sodium 134 - 144 mmol/L 142 143 138  Potassium 3.5 - 5.2 mmol/L 5.2 5.6(H) 3.8  Chloride 96 - 106 mmol/L 103 104 103  CO2 20 - 29 mmol/L 27 30(H) 27  Calcium 8.7 - 10.3 mg/dL 9.7 9.8 9.4  Total Protein 6.0 - 8.5 g/dL - 6.6 7.2  Total Bilirubin 0.0 - 1.2 mg/dL - 0.6 0.8  Alkaline Phos 44 - 121 IU/L - 34(L) 38  AST 0 - 40 IU/L - 24 32  ALT 0 - 32 IU/L - 19 25    Hepatic Function Latest Ref Rng & Units 05/17/2021 12/17/2020 04/29/2020  Total Protein 6.0 - 8.5 g/dL 6.6 7.2 7.0  Albumin 3.6 - 4.6 g/dL 4.2 4.1 4.3  AST 0 - 40 IU/L 24 32 21  ALT 0 - 32 IU/L _1 Alk Phosphatase 44 - 121 IU/L 34(L) 38 43(L)  Total Bilirubin 0.0 - 1.2 mg/dL 0.6 0.8 0.6  Bilirubin, Direct 0.00 - 0.40 mg/dL - - -    Above lab data reviewed with patient.  Assessment:  #1.  History of pan ulcerative colitis.  She remains in remission on low-dose oral mesalamine.  Last surveillance colonoscopy was in March 2017.  We decided to hold off future exams given her age unless she has symptoms.  #2.  Chronic GERD complicated by stricture which was dilated back in 2017.  She is watching her diet.  She has done well with PPI every other day.  We will change pantoprazole dose to 20 mg daily and see how she does.  Plan:  Change pantoprazole dose to 20 mg p.o. every morning.  Prescription sent for 90 days with 3 refills. Office visit in 6 months with Dr. Jenetta Downer.

## 2021-06-29 NOTE — Patient Instructions (Signed)
Please notify of pantoprazole 20 mg daily does not control heartburn. Notify if you have swallowing difficulty diarrhea or rectal bleeding.

## 2021-07-02 ENCOUNTER — Telehealth: Payer: Self-pay

## 2021-07-02 NOTE — Telephone Encounter (Signed)
? ?  Telephone encounter was:  Successful.  ?07/02/2021 ?Name: Lori Soto MRN: [id number] DOB: 04/16/1936 ? ?Lori Soto is a 86 y.o. year old female who is a primary care patient of [name], DO . The community resource team was consulted for assistance with Home Modifications ? ?Care guide performed the following interventions: Patient provided with information about care guide support team and interviewed to confirm resource needs. Patient has not received resources in the mail yet I will follow back up in a few days ? ?Follow Up Plan:  Care guide will follow up with patient by phone over the next week ? ? ? ?Lori Soto ?Care Guide, Embedded Care Coordination ?[facility]  ?[phone] ?[address], [place], [place] [address] ?Phone: [phone] ?Email: [email or web address] ? ?  ?

## 2021-07-05 ENCOUNTER — Ambulatory Visit: Payer: Medicare Other | Attending: Family Medicine | Admitting: Physical Therapy

## 2021-07-05 ENCOUNTER — Other Ambulatory Visit: Payer: Self-pay

## 2021-07-05 DIAGNOSIS — M5441 Lumbago with sciatica, right side: Secondary | ICD-10-CM

## 2021-07-05 DIAGNOSIS — R293 Abnormal posture: Secondary | ICD-10-CM | POA: Insufficient documentation

## 2021-07-05 DIAGNOSIS — G8929 Other chronic pain: Secondary | ICD-10-CM | POA: Diagnosis not present

## 2021-07-05 DIAGNOSIS — M6281 Muscle weakness (generalized): Secondary | ICD-10-CM | POA: Insufficient documentation

## 2021-07-05 NOTE — Therapy (Signed)
[place] ?[facility] ?[place] ?[place], [place], [address] ?Phone: [phone]   Fax:  [phone] ? ?Physical Therapy Treatment ? ?Patient Details  ?Name: Lori Soto ?MRN: [id number] ?Date of Birth: 1935/10/19 ?Referring Provider (PT): Ronnie Doss DO. ? ? ?Encounter Date: 07/05/2021 ? ? PT End of Session - 07/05/21 1529   ? ? Visit Number 11   ? Number of Visits 12   ? Date for PT Re-Evaluation 08/18/21   ? Authorization Type PROGRESS NOTE AT 10TH VISIT.  KX MODIFIER AFTER 15 VISITS.   ? PT Start Time 0155   Late arrival.  ? PT Stop Time 58   ? PT Time Calculation (min) 45 min   ? Activity Tolerance Patient tolerated treatment well   ? Behavior During Therapy [facility] for tasks assessed/performed   ? ?  ?  ? ?  ? ? ?Past Medical History:  ?Diagnosis Date  ? Anemia   ? Arthritis   ? Cancer [facility])   ? skin cancer on nose  ? Carpal tunnel syndrome, bilateral   ? Cataract   ? Esophagitis   ? GERD (gastroesophageal reflux disease)   ? H/O hiatal hernia   ? Osteopenia   ? Scoliosis   ? Shingles   ? SVT (supraventricular tachycardia) ([place])   ? Ulcerative colitis   ? ? ?Past Surgical History:  ?Procedure Laterality Date  ? ABDOMINAL HYSTERECTOMY    ? BUNIONECTOMY    ? Bilateral  ? CARPAL TUNNEL RELEASE Left 03/10/2015  ? Procedure: LEFT CARPAL TUNNEL RELEASE;  Surgeon: Daryll Brod, MD;  Location: [place];  Service: Orthopedics;  Laterality: Left;  ? CARPAL TUNNEL RELEASE Right 10/31/2017  ? Procedure: RIGHT CARPAL TUNNEL RELEASE;  Surgeon: Daryll Brod, MD;  Location: [place];  Service: Orthopedics;  Laterality: Right;  ? CATARACT EXTRACTION W/PHACO  05/28/2012  ? Procedure: CATARACT EXTRACTION PHACO AND INTRAOCULAR LENS PLACEMENT (IOC);  Surgeon: Tonny Branch, MD;  [facility];  Service: Ophthalmology;  Laterality: Right;  CDE=12.84  ? CATARACT EXTRACTION W/PHACO  06/07/2012  ? Procedure: CATARACT EXTRACTION PHACO AND INTRAOCULAR LENS PLACEMENT  (IOC);  Surgeon: Tonny Branch, MD;  [facility];  Service: Ophthalmology;  Laterality: Left;  CDE 17.60  ? COLONOSCOPY    ? COLONOSCOPY N/A 07/08/2015  ? Procedure: COLONOSCOPY;  Surgeon: Rogene Houston, MD;  [facility];  Service: Endoscopy;  Laterality: N/A;  930  ? ESOPHAGEAL DILATION N/A 07/08/2015  ? Procedure: ESOPHAGEAL DILATION;  Surgeon: Rogene Houston, MD;  [facility];  Service: Endoscopy;  Laterality: N/A;  ? ESOPHAGOGASTRODUODENOSCOPY N/A 07/08/2015  ? Procedure: ESOPHAGOGASTRODUODENOSCOPY (EGD);  Surgeon: Rogene Houston, MD;  [facility];  Service: Endoscopy;  Laterality: N/A;  ? Hernia    ? Right inguinal  ? HERNIA REPAIR  1961  ? right inguinal hernia  ? UPPER GASTROINTESTINAL ENDOSCOPY    ? ? ?There were no vitals filed for this visit. ? ? Subjective Assessment - 07/05/21 1530   ? ? Subjective COVID-19 screen performed prior to patient entering clinic.  LBP-level about a 3 today.   ? Pertinent History OA, Scoliosis.   ? How long can you sit comfortably? Unlimited.   ? How long can you stand comfortably? Varies.   ? How long can you walk comfortably? Varies.   ? Patient Stated Goals Get around better with less pain.   ? Currently in Pain? Yes   ? Pain Score  3    ? Pain Location Back   ? Pain Orientation Right   ? Pain Descriptors / Indicators Aching   ? Pain Type Chronic pain   ? Pain Onset More than a month ago   ? ?  ?  ? ?  ? ? ? ? ? ? ? ? ? ? ? ? ? ? ? ? ? ? ? ? [place] Adult PT Treatment/Exercise - 07/05/21 0001   ? ?  ? Exercises  ? Exercises Knee/Hip   ?  ? Knee/Hip Exercises: Aerobic  ? Nustep Level 3 x 8 minutes.   ?  ? Modalities  ? Modalities Electrical Stimulation   ?  ? Electrical Stimulation  ? Electrical Stimulation Location RT LB   ? Electrical Stimulation Action Pre-mod.   ? Electrical Stimulation Parameters 80-150 Hz. x 10 minutes.   ? Electrical Stimulation Goals Tone;Pain   ?  ? Traction  ? Type of Traction Lumbar   ? Min (lbs) 5   ? Max (lbs) 80    ? Hold Time 99   ? Rest Time 5   ? Time 15   ? ?  ?  ? ?  ? ? ? ? ? ? ? ? ? ? ? ? ? ? ? PT Long Term Goals - 06/28/21 1455   ? ?  ? PT LONG TERM GOAL #1  ? Title Patient will be independent with an advanced HEP   ? Baseline met 09/05/19   ? Time 6   ? Period Weeks   ? Status On-going   ?  ? PT LONG TERM GOAL #2  ? Title Patient will report ability to perfrom ADLs with low back pain less than or equal to 4/10   ? Time 6   ? Period Weeks   ? Status Partially Met   ?  ? PT LONG TERM GOAL #3  ? Title Stand 30 minutes with pain not > 4/10.   ? Time 6   ? Period Weeks   ? Status Partially Met   ?  ? PT LONG TERM GOAL #4  ? Title Walk a community distance with pain not > 4/10 and no radiation into the right buttock and posterior thigh.   ? Time 6   ? Period Weeks   ? Status Partially Met   ? ?  ?  ? ?  ? ? ? ? ? ? ? ? Plan - 07/05/21 1549   ? ? Clinical Impression Statement The patient has been responding well to treatments and her low back pain-level has been consistently lowered as of late.   ? Personal Factors and Comorbidities Other;Comorbidity 1;Age   ? Comorbidities OA, Scoliosis.   ? Examination-Activity Limitations Other;Locomotion Level;Stand   ? Examination-Participation Restrictions Other;Meal Prep   ? Stability/Clinical Decision Making Evolving/Moderate complexity   ? PT Frequency 2x / week   ? PT Duration 6 weeks   ? PT Treatment/Interventions ADLs/Self Care Home Management;Cryotherapy;Electrical Stimulation;Ultrasound;Traction;Moist Heat;Therapeutic activities;Therapeutic exercise;Manual techniques;Patient/family education;Passive range of motion;Dry needling   ? PT Next Visit Plan Patient going to try reducing to once a week to see how she does.  Continue with POC.   ? Consulted and Agree with Plan of Care Patient   ? ?  ?  ? ?  ? ? ?Patient will benefit from skilled therapeutic intervention in order to improve the following deficits and impairments:  Abnormal gait, Decreased activity tolerance, Decreased  range of motion, Decreased strength, Increased muscle  spasms, Postural dysfunction ? ?Visit Diagnosis: ?Chronic right-sided low back pain with right-sided sciatica ? ?Abnormal posture ? ? ? ? ?Problem List ?Patient Active Problem List  ? Diagnosis Date Noted  ? Cough 05/29/2020  ? Chest congestion 05/29/2020  ? Arthritis 06/13/2019  ? GERD (gastroesophageal reflux disease) 06/10/2019  ? Esophageal stricture 06/10/2019  ? Educated about COVID-19 virus infection 06/04/2019  ? SVT (supraventricular tachycardia) ([place]) 08/28/2018  ? Elevated troponin 08/28/2018  ? Light headedness 05/09/2018  ? Carpal tunnel syndrome of right wrist 05/20/2015  ? Primary localized osteoarthrosis, hand 05/20/2015  ? Carpal tunnel syndrome on left 03/18/2015  ? Osteopenia of the elderly 02/19/2014  ? DDD (degenerative disc disease), lumbar 12/03/2013  ? Scoliosis 12/03/2013  ? Chronic LBP 05/14/2013  ? IBS (irritable bowel syndrome) 11/08/2012  ? UC (ulcerative colitis) ([place]) 04/23/2012  ? Palpitation 10/26/2011  ? Hypertension 10/26/2011  ? Chronic ulcerative colitis ([place]) 03/23/2011  ? ? ?, [place], PT ?07/05/2021, 3:52 PM ? ?[place] ?[facility] ?[address] ?[place], [place], [address] ?Phone: [phone]   Fax:  [phone] ? ?Name: Lori Soto ?MRN: [id number] ?Date of Birth: August 09, 1935 ? ? ? ?

## 2021-07-06 ENCOUNTER — Telehealth: Payer: Self-pay

## 2021-07-06 NOTE — Telephone Encounter (Signed)
? ?  Telephone encounter was:  Successful.  ?07/06/2021 ?Name: Lori Soto MRN: [id number] DOB: [birth year] ? ?Lori Soto is a 86 y.o. year old female who is a primary care patient of [name], DO . The community resource team was consulted for assistance with Home Modifications ? ?Care guide performed the following interventions: Patient provided with information about care guide support team and interviewed to confirm resource needs.Patient stated insuance is still working in home and she hasnt received esouces in the mail yet. I will follow back up in a few days and resend if needed ? ?Follow Up Plan:  Care guide will follow up with patient by phone over the next week ? ? ?Larena Sox ?Care Guide, Embedded Care Coordination ?[place], Care Management  ?[phone] ?[address], [place], [place] [address] ?Phone: [phone] ?Email: [email or web address] ? ?  ? ?

## 2021-07-12 ENCOUNTER — Other Ambulatory Visit: Payer: Self-pay

## 2021-07-12 ENCOUNTER — Ambulatory Visit: Payer: Medicare Other

## 2021-07-12 DIAGNOSIS — R293 Abnormal posture: Secondary | ICD-10-CM | POA: Diagnosis not present

## 2021-07-12 DIAGNOSIS — M6281 Muscle weakness (generalized): Secondary | ICD-10-CM

## 2021-07-12 DIAGNOSIS — M5441 Lumbago with sciatica, right side: Secondary | ICD-10-CM | POA: Diagnosis not present

## 2021-07-12 DIAGNOSIS — G8929 Other chronic pain: Secondary | ICD-10-CM | POA: Diagnosis not present

## 2021-07-12 NOTE — Therapy (Signed)
[place] ?[facility] ?[place] ?[place], [place], [address] ?Phone: [phone]   Fax:  [phone] ? ?Physical Therapy Treatment ? ?Patient Details  ?Name: Lori Soto ?MRN: [id number] ?Date of Birth: 03/24/1936 ?Referring Provider (PT): Ronnie Doss DO. ? ? ?Encounter Date: 07/12/2021 ? ? PT End of Session - 07/12/21 1029   ? ? Visit Number 12   ? Number of Visits 12   ? Date for PT Re-Evaluation 08/18/21   ? Authorization Type PROGRESS NOTE AT 10TH VISIT.  KX MODIFIER AFTER 15 VISITS.   ? PT Start Time 1030   ? PT Stop Time 1130   ? PT Time Calculation (min) 60 min   ? Activity Tolerance Patient tolerated treatment well   ? Behavior During Therapy [facility] for tasks assessed/performed   ? ?  ?  ? ?  ? ? ?Past Medical History:  ?Diagnosis Date  ? Anemia   ? Arthritis   ? Cancer [facility])   ? skin cancer on nose  ? Carpal tunnel syndrome, bilateral   ? Cataract   ? Esophagitis   ? GERD (gastroesophageal reflux disease)   ? H/O hiatal hernia   ? Osteopenia   ? Scoliosis   ? Shingles   ? SVT (supraventricular tachycardia) ([place])   ? Ulcerative colitis   ? ? ?Past Surgical History:  ?Procedure Laterality Date  ? ABDOMINAL HYSTERECTOMY    ? BUNIONECTOMY    ? Bilateral  ? CARPAL TUNNEL RELEASE Left 03/10/2015  ? Procedure: LEFT CARPAL TUNNEL RELEASE;  Surgeon: Daryll Brod, MD;  Location: [place];  Service: Orthopedics;  Laterality: Left;  ? CARPAL TUNNEL RELEASE Right 10/31/2017  ? Procedure: RIGHT CARPAL TUNNEL RELEASE;  Surgeon: Daryll Brod, MD;  Location: [place];  Service: Orthopedics;  Laterality: Right;  ? CATARACT EXTRACTION W/PHACO  05/28/2012  ? Procedure: CATARACT EXTRACTION PHACO AND INTRAOCULAR LENS PLACEMENT (IOC);  Surgeon: Tonny Branch, MD;  [facility];  Service: Ophthalmology;  Laterality: Right;  CDE=12.84  ? CATARACT EXTRACTION W/PHACO  06/07/2012  ? Procedure: CATARACT EXTRACTION PHACO AND INTRAOCULAR LENS PLACEMENT (IOC);  Surgeon:  Tonny Branch, MD;  [facility];  Service: Ophthalmology;  Laterality: Left;  CDE 17.60  ? COLONOSCOPY    ? COLONOSCOPY N/A 07/08/2015  ? Procedure: COLONOSCOPY;  Surgeon: Rogene Houston, MD;  [facility];  Service: Endoscopy;  Laterality: N/A;  930  ? ESOPHAGEAL DILATION N/A 07/08/2015  ? Procedure: ESOPHAGEAL DILATION;  Surgeon: Rogene Houston, MD;  [facility];  Service: Endoscopy;  Laterality: N/A;  ? ESOPHAGOGASTRODUODENOSCOPY N/A 07/08/2015  ? Procedure: ESOPHAGOGASTRODUODENOSCOPY (EGD);  Surgeon: Rogene Houston, MD;  [facility];  Service: Endoscopy;  Laterality: N/A;  ? Hernia    ? Right inguinal  ? HERNIA REPAIR  1961  ? right inguinal hernia  ? UPPER GASTROINTESTINAL ENDOSCOPY    ? ? ?There were no vitals filed for this visit. ? ? Subjective Assessment - 07/12/21 1028   ? ? Subjective COVID-19 screen performed prior to patient entering clinic.  Pt arrives for today's treamtent session denying any back pain, but reports knees are bothering her.   ? Pertinent History OA, Scoliosis.   ? How long can you sit comfortably? Unlimited.   ? How long can you stand comfortably? Varies.   ? How long can you walk comfortably? Varies.   ? Patient Stated Goals Get around better with less pain.   ? Currently  in Pain? Yes   ? Pain Score 4    ? Pain Location Knee   ? Pain Orientation Right;Left   ? Pain Onset More than a month ago   ? ?  ?  ? ?  ? ? ? ? ? ? ? ? ? ? ? ? ? ? ? ? ? ? ? ? [place] Adult PT Treatment/Exercise - 07/12/21 0001   ? ?  ? Knee/Hip Exercises: Aerobic  ? Nustep Lvl 3 x 15 mins   ?  ? Modalities  ? Modalities Electrical Stimulation;Traction   ?  ? Electrical Stimulation  ? Electrical Stimulation Location Right Low back   ? Electrical Stimulation Action Pre-mod   ? Electrical Stimulation Parameters 80-150 hz x 10 mins   ? Electrical Stimulation Goals Tone;Pain   ?  ? Traction  ? Type of Traction Lumbar   ? Min (lbs) 5   ? Max (lbs) 80   ? Hold Time 99   ? Rest Time 5   ?  Time 15   ? ?  ?  ? ?  ? ? ? ? ? ? ? ? ? ? ? ? ? ? ? PT Long Term Goals - 07/12/21 1113   ? ?  ? PT LONG TERM GOAL #1  ? Title Patient will be independent with an advanced HEP   ? Baseline met 09/05/19   ? Time 6   ? Period Weeks   ? Status On-going   ?  ? PT LONG TERM GOAL #2  ? Title Patient will report ability to perfrom ADLs with low back pain less than or equal to 4/10   ? Time 6   ? Period Weeks   ? Status Partially Met   ?  ? PT LONG TERM GOAL #3  ? Title Stand 30 minutes with pain not > 4/10.   ? Time 6   ? Period Weeks   ? Status Partially Met   ?  ? PT LONG TERM GOAL #4  ? Title Walk a community distance with pain not > 4/10 and no radiation into the right buttock and posterior thigh.   ? Time 6   ? Period Weeks   ? Status Partially Met   ? ?  ?  ? ?  ? ? ? ? ? ? ? ? Plan - 07/12/21 1029   ? ? Clinical Impression Statement Pt arrives for today's treatment denying any back, but endorses B knee pain.  Pt responding well to treatments with her back pain continuing to lower.  Pt states that her back pain tends to increase when she increases her activity level during the day.  Pt is making good progress towards all of her goals at this time.   ? Personal Factors and Comorbidities Other;Comorbidity 1;Age   ? Comorbidities OA, Scoliosis.   ? Examination-Activity Limitations Other;Locomotion Level;Stand   ? Examination-Participation Restrictions Other;Meal Prep   ? Stability/Clinical Decision Making Evolving/Moderate complexity   ? PT Frequency 2x / week   ? PT Duration 6 weeks   ? PT Treatment/Interventions ADLs/Self Care Home Management;Cryotherapy;Electrical Stimulation;Ultrasound;Traction;Moist Heat;Therapeutic activities;Therapeutic exercise;Manual techniques;Patient/family education;Passive range of motion;Dry needling   ? PT Next Visit Plan Patient going to try reducing to once a week to see how she does.  Continue with POC.   ? Consulted and Agree with Plan of Care Patient   ? ?  ?  ? ?  ? ? ?Patient will  benefit from skilled therapeutic intervention  in order to improve the following deficits and impairments:  Abnormal gait, Decreased activity tolerance, Decreased range of motion, Decreased strength, Increased muscle spasms, Postural dysfunction ? ?Visit Diagnosis: ?Chronic right-sided low back pain with right-sided sciatica ? ?Abnormal posture ? ?Muscle weakness (generalized) ? ? ? ? ?Problem List ?Patient Active Problem List  ? Diagnosis Date Noted  ? Cough 05/29/2020  ? Chest congestion 05/29/2020  ? Arthritis 06/13/2019  ? GERD (gastroesophageal reflux disease) 06/10/2019  ? Esophageal stricture 06/10/2019  ? Educated about COVID-19 virus infection 06/04/2019  ? SVT (supraventricular tachycardia) ([place]) 08/28/2018  ? Elevated troponin 08/28/2018  ? Light headedness 05/09/2018  ? Carpal tunnel syndrome of right wrist 05/20/2015  ? Primary localized osteoarthrosis, hand 05/20/2015  ? Carpal tunnel syndrome on left 03/18/2015  ? Osteopenia of the elderly 02/19/2014  ? DDD (degenerative disc disease), lumbar 12/03/2013  ? Scoliosis 12/03/2013  ? Chronic LBP 05/14/2013  ? IBS (irritable bowel syndrome) 11/08/2012  ? UC (ulcerative colitis) ([place]) 04/23/2012  ? Palpitation 10/26/2011  ? Hypertension 10/26/2011  ? Chronic ulcerative colitis ([place]) 03/23/2011  ? ? ?Kathrynn Ducking, PTA ?07/12/2021, 11:34 AM ? ?[place] ?[facility] ?[address] ?[place], [place], [address] ?Phone: [phone]   Fax:  [phone] ? ?Name: Euphemia Lingerfelt ?MRN: [id number] ?Date of Birth: 07-Jun-1935 ? ? ? ?

## 2021-07-12 NOTE — Addendum Note (Signed)
Addended by: Ghalia Reicks, [place] W on: 07/12/2021 12:17 PM ? ? Modules accepted: Orders ? ?

## 2021-07-19 ENCOUNTER — Other Ambulatory Visit: Payer: Self-pay

## 2021-07-19 ENCOUNTER — Ambulatory Visit: Payer: Medicare Other | Admitting: Physical Therapy

## 2021-07-19 DIAGNOSIS — G8929 Other chronic pain: Secondary | ICD-10-CM

## 2021-07-19 DIAGNOSIS — M6281 Muscle weakness (generalized): Secondary | ICD-10-CM | POA: Diagnosis not present

## 2021-07-19 DIAGNOSIS — R293 Abnormal posture: Secondary | ICD-10-CM | POA: Diagnosis not present

## 2021-07-19 DIAGNOSIS — M5441 Lumbago with sciatica, right side: Secondary | ICD-10-CM | POA: Diagnosis not present

## 2021-07-19 NOTE — Therapy (Signed)
[place] ?[facility] ?[address] ?[place], [place], [address] ?Phone: [phone]   Fax:  [phone] ? ?Physical Therapy Treatment ? ?Patient Details  ?Name: Lori Soto ?MRN: [id number] ?Date of Birth: 1936/01/29 ?Referring Provider (PT): Ronnie Doss DO. ? ? ?Encounter Date: 07/19/2021 ? ? PT End of Session - 07/19/21 1436   ? ? Visit Number 13   ? Number of Visits 16   ? Date for PT Re-Evaluation 08/18/21   ? Authorization Type PROGRESS NOTE AT 10TH VISIT.  KX MODIFIER AFTER 15 VISITS.   ? PT Start Time 0205   ? PT Stop Time 6962   ? PT Time Calculation (min) 53 min   ? Activity Tolerance Patient tolerated treatment well   ? Behavior During Therapy [facility] for tasks assessed/performed   ? ?  ?  ? ?  ? ? ?Past Medical History:  ?Diagnosis Date  ? Anemia   ? Arthritis   ? Cancer [facility])   ? skin cancer on nose  ? Carpal tunnel syndrome, bilateral   ? Cataract   ? Esophagitis   ? GERD (gastroesophageal reflux disease)   ? H/O hiatal hernia   ? Osteopenia   ? Scoliosis   ? Shingles   ? SVT (supraventricular tachycardia) ([place])   ? Ulcerative colitis   ? ? ?Past Surgical History:  ?Procedure Laterality Date  ? ABDOMINAL HYSTERECTOMY    ? BUNIONECTOMY    ? Bilateral  ? CARPAL TUNNEL RELEASE Left 03/10/2015  ? Procedure: LEFT CARPAL TUNNEL RELEASE;  Surgeon: Daryll Brod, MD;  Location: [place];  Service: Orthopedics;  Laterality: Left;  ? CARPAL TUNNEL RELEASE Right 10/31/2017  ? Procedure: RIGHT CARPAL TUNNEL RELEASE;  Surgeon: Daryll Brod, MD;  Location: [place];  Service: Orthopedics;  Laterality: Right;  ? CATARACT EXTRACTION W/PHACO  05/28/2012  ? Procedure: CATARACT EXTRACTION PHACO AND INTRAOCULAR LENS PLACEMENT (IOC);  Surgeon: Tonny Branch, MD;  [facility];  Service: Ophthalmology;  Laterality: Right;  CDE=12.84  ? CATARACT EXTRACTION W/PHACO  06/07/2012  ? Procedure: CATARACT EXTRACTION PHACO AND INTRAOCULAR LENS PLACEMENT (IOC);  Surgeon:  Tonny Branch, MD;  [facility];  Service: Ophthalmology;  Laterality: Left;  CDE 17.60  ? COLONOSCOPY    ? COLONOSCOPY N/A 07/08/2015  ? Procedure: COLONOSCOPY;  Surgeon: Rogene Houston, MD;  [facility];  Service: Endoscopy;  Laterality: N/A;  930  ? ESOPHAGEAL DILATION N/A 07/08/2015  ? Procedure: ESOPHAGEAL DILATION;  Surgeon: Rogene Houston, MD;  [facility];  Service: Endoscopy;  Laterality: N/A;  ? ESOPHAGOGASTRODUODENOSCOPY N/A 07/08/2015  ? Procedure: ESOPHAGOGASTRODUODENOSCOPY (EGD);  Surgeon: Rogene Houston, MD;  [facility];  Service: Endoscopy;  Laterality: N/A;  ? Hernia    ? Right inguinal  ? HERNIA REPAIR  1961  ? right inguinal hernia  ? UPPER GASTROINTESTINAL ENDOSCOPY    ? ? ?There were no vitals filed for this visit. ? ? Subjective Assessment - 07/19/21 1433   ? ? Subjective COVID-19 screen performed prior to patient entering clinic.  Back pain staying around a 3 over the weekend.   ? Pertinent History OA, Scoliosis.   ? How long can you sit comfortably? Unlimited.   ? How long can you stand comfortably? Varies.   ? How long can you walk comfortably? Varies.   ? Patient Stated Goals Get around better with less pain.   ? Currently in Pain? Yes   ? Pain  Score 3    ? Pain Location Back   ? ?  ?  ? ?  ? ? ? ? ? ? ? ? ? ? ? ? ? ? ? ? ? ? ? ? [place] Adult PT Treatment/Exercise - 07/19/21 0001   ? ?  ? Exercises  ? Exercises Knee/Hip   ?  ? Lumbar Exercises: Aerobic  ? Nustep Level 3 x 10 minutes.   ?  ? Electrical Stimulation  ? Electrical Stimulation Location RT low back   ? Electrical Stimulation Action Pre-mod. at 80-150 Hz.   ? Electrical Stimulation Parameters x 15 minutes.   ? Electrical Stimulation Goals Tone;Pain   ?  ? Traction  ? Type of Traction Lumbar   ? Min (lbs) 5   ? Max (lbs) 80   ? Hold Time 99   ? Rest Time 5   ? Time 15   ? ?  ?  ? ?  ? ? ? ? ? ? ? ? ? ? ? ? ? ? ? PT Long Term Goals - 07/12/21 1113   ? ?  ? PT LONG TERM GOAL #1  ? Title Patient will  be independent with an advanced HEP   ? Baseline met 09/05/19   ? Time 6   ? Period Weeks   ? Status On-going   ?  ? PT LONG TERM GOAL #2  ? Title Patient will report ability to perfrom ADLs with low back pain less than or equal to 4/10   ? Time 6   ? Period Weeks   ? Status Partially Met   ?  ? PT LONG TERM GOAL #3  ? Title Stand 30 minutes with pain not > 4/10.   ? Time 6   ? Period Weeks   ? Status Partially Met   ?  ? PT LONG TERM GOAL #4  ? Title Walk a community distance with pain not > 4/10 and no radiation into the right buttock and posterior thigh.   ? Time 6   ? Period Weeks   ? Status Partially Met   ? ?  ?  ? ?  ? ? ? ? ? ? ? ? Plan - 07/19/21 1444   ? ? Clinical Impression Statement The patient has done very well with her treatments.  Her right-sided low back pain has been staying consistently at a lower level.   ? Personal Factors and Comorbidities Other;Comorbidity 1;Age   ? Comorbidities OA, Scoliosis.   ? Examination-Activity Limitations Other;Locomotion Level;Stand   ? Examination-Participation Restrictions Other;Meal Prep   ? Stability/Clinical Decision Making Evolving/Moderate complexity   ? PT Frequency 2x / week   ? PT Duration 6 weeks   ? PT Treatment/Interventions ADLs/Self Care Home Management;Cryotherapy;Electrical Stimulation;Ultrasound;Traction;Moist Heat;Therapeutic activities;Therapeutic exercise;Manual techniques;Patient/family education;Passive range of motion;Dry needling   ? PT Next Visit Plan Patient going to try reducing to once a week to see how she does.  Continue with POC.   ? Consulted and Agree with Plan of Care Patient   ? ?  ?  ? ?  ? ? ?Patient will benefit from skilled therapeutic intervention in order to improve the following deficits and impairments:  Abnormal gait, Decreased activity tolerance, Decreased range of motion, Decreased strength, Increased muscle spasms, Postural dysfunction ? ?Visit Diagnosis: ?Chronic right-sided low back pain with right-sided  sciatica ? ?Abnormal posture ? ? ? ? ?Problem List ?Patient Active Problem List  ? Diagnosis Date Noted  ? Cough 05/29/2020  ?  Chest congestion 05/29/2020  ? Arthritis 06/13/2019  ? GERD (gastroesophageal reflux disease) 06/10/2019  ? Esophageal stricture 06/10/2019  ? Educated about COVID-19 virus infection 06/04/2019  ? SVT (supraventricular tachycardia) ([place]) 08/28/2018  ? Elevated troponin 08/28/2018  ? Light headedness 05/09/2018  ? Carpal tunnel syndrome of right wrist 05/20/2015  ? Primary localized osteoarthrosis, hand 05/20/2015  ? Carpal tunnel syndrome on left 03/18/2015  ? Osteopenia of the elderly 02/19/2014  ? DDD (degenerative disc disease), lumbar 12/03/2013  ? Scoliosis 12/03/2013  ? Chronic LBP 05/14/2013  ? IBS (irritable bowel syndrome) 11/08/2012  ? UC (ulcerative colitis) ([place]) 04/23/2012  ? Palpitation 10/26/2011  ? Hypertension 10/26/2011  ? Chronic ulcerative colitis ([place]) 03/23/2011  ? ? ?Kyisha Fowle, [place], PT ?07/19/2021, 3:02 PM ? ? ?[facility] ?[address] ?[place], [place], [address] ?Phone: [phone]   Fax:  [phone] ? ?Name: Dorri Ozturk ?MRN: [id number] ?Date of Birth: [birth year] ? ? ? ?

## 2021-07-22 ENCOUNTER — Telehealth: Payer: Self-pay

## 2021-07-26 ENCOUNTER — Ambulatory Visit: Payer: Medicare Other | Admitting: Physical Therapy

## 2021-07-26 ENCOUNTER — Other Ambulatory Visit: Payer: Self-pay

## 2021-07-26 DIAGNOSIS — M5441 Lumbago with sciatica, right side: Secondary | ICD-10-CM | POA: Diagnosis not present

## 2021-07-26 DIAGNOSIS — G8929 Other chronic pain: Secondary | ICD-10-CM

## 2021-07-26 DIAGNOSIS — R293 Abnormal posture: Secondary | ICD-10-CM | POA: Diagnosis not present

## 2021-07-26 DIAGNOSIS — M6281 Muscle weakness (generalized): Secondary | ICD-10-CM | POA: Diagnosis not present

## 2021-07-26 NOTE — Therapy (Signed)
[place] ?[facility] ?[place] ?[place], [place], [address] ?Phone: [phone]   Fax:  [phone] ? ?Physical Therapy Treatment ? ?Patient Details  ?Name: Lori Soto ?MRN: [id number] ?Date of Birth: 26-Jul-1935 ?Referring Provider (PT): Ronnie Doss DO. ? ? ?Encounter Date: 07/26/2021 ? ? PT End of Session - 07/26/21 1454   ? ? Visit Number 14   ? Number of Visits 16   ? Date for PT Re-Evaluation 08/18/21   ? Authorization Type PROGRESS NOTE AT 10TH VISIT.  KX MODIFIER AFTER 15 VISITS.   ? PT Start Time 0157   ? PT Stop Time 0259   ? PT Time Calculation (min) 62 min   ? Activity Tolerance Patient tolerated treatment well   ? Behavior During Therapy [facility] for tasks assessed/performed   ? ?  ?  ? ?  ? ? ?Past Medical History:  ?Diagnosis Date  ? Anemia   ? Arthritis   ? Cancer [facility])   ? skin cancer on nose  ? Carpal tunnel syndrome, bilateral   ? Cataract   ? Esophagitis   ? GERD (gastroesophageal reflux disease)   ? H/O hiatal hernia   ? Osteopenia   ? Scoliosis   ? Shingles   ? SVT (supraventricular tachycardia) ([place])   ? Ulcerative colitis   ? ? ?Past Surgical History:  ?Procedure Laterality Date  ? ABDOMINAL HYSTERECTOMY    ? BUNIONECTOMY    ? Bilateral  ? CARPAL TUNNEL RELEASE Left 03/10/2015  ? Procedure: LEFT CARPAL TUNNEL RELEASE;  Surgeon: Daryll Brod, MD;  Location: [place];  Service: Orthopedics;  Laterality: Left;  ? CARPAL TUNNEL RELEASE Right 10/31/2017  ? Procedure: RIGHT CARPAL TUNNEL RELEASE;  Surgeon: Daryll Brod, MD;  Location: [place];  Service: Orthopedics;  Laterality: Right;  ? CATARACT EXTRACTION W/PHACO  05/28/2012  ? Procedure: CATARACT EXTRACTION PHACO AND INTRAOCULAR LENS PLACEMENT (IOC);  Surgeon: Tonny Branch, MD;  [facility];  Service: Ophthalmology;  Laterality: Right;  CDE=12.84  ? CATARACT EXTRACTION W/PHACO  06/07/2012  ? Procedure: CATARACT EXTRACTION PHACO AND INTRAOCULAR LENS PLACEMENT (IOC);  Surgeon:  Tonny Branch, MD;  [facility];  Service: Ophthalmology;  Laterality: Left;  CDE 17.60  ? COLONOSCOPY    ? COLONOSCOPY N/A 07/08/2015  ? Procedure: COLONOSCOPY;  Surgeon: Rogene Houston, MD;  [facility];  Service: Endoscopy;  Laterality: N/A;  930  ? ESOPHAGEAL DILATION N/A 07/08/2015  ? Procedure: ESOPHAGEAL DILATION;  Surgeon: Rogene Houston, MD;  [facility];  Service: Endoscopy;  Laterality: N/A;  ? ESOPHAGOGASTRODUODENOSCOPY N/A 07/08/2015  ? Procedure: ESOPHAGOGASTRODUODENOSCOPY (EGD);  Surgeon: Rogene Houston, MD;  [facility];  Service: Endoscopy;  Laterality: N/A;  ? Hernia    ? Right inguinal  ? HERNIA REPAIR  1961  ? right inguinal hernia  ? UPPER GASTROINTESTINAL ENDOSCOPY    ? ? ?There were no vitals filed for this visit. ? ? ? ? ? ? ? ? ? ? ? ? ? ? ? ? ? ? ? ? ? [place] Adult PT Treatment/Exercise - 07/26/21 0001   ? ?  ? Exercises  ? Exercises Knee/Hip   ?  ? Lumbar Exercises: Aerobic  ? Nustep Level 3 x 13 minutes.   ?  ? Electrical Stimulation  ? Electrical Stimulation Location RT low back   ? Electrical Stimulation Action Pre-mod.   ? Electrical Stimulation Parameters 80-150 Hz. x 20 minutes.   ?  ?  Traction  ? Type of Traction Lumbar   ? Min (lbs) 5   ? Max (lbs) 80   ? Hold Time 99   ? Rest Time 5   ? Time 20   ? ?  ?  ? ?  ? ? ? ? ? ? ? ? ? ? ? ? ? ? ? PT Long Term Goals - 07/12/21 1113   ? ?  ? PT LONG TERM GOAL #1  ? Title Patient will be independent with an advanced HEP   ? Baseline met 09/05/19   ? Time 6   ? Period Weeks   ? Status On-going   ?  ? PT LONG TERM GOAL #2  ? Title Patient will report ability to perfrom ADLs with low back pain less than or equal to 4/10   ? Time 6   ? Period Weeks   ? Status Partially Met   ?  ? PT LONG TERM GOAL #3  ? Title Stand 30 minutes with pain not > 4/10.   ? Time 6   ? Period Weeks   ? Status Partially Met   ?  ? PT LONG TERM GOAL #4  ? Title Walk a community distance with pain not > 4/10 and no radiation into the right  buttock and posterior thigh.   ? Time 6   ? Period Weeks   ? Status Partially Met   ? ?  ?  ? ?  ? ? ? ? ? ? ? ? Plan - 07/26/21 1457   ? ? Clinical Impression Statement The patient continues to do well and in spite of being very active her pain is staying low.  CC is localized to right low back region.   ? Personal Factors and Comorbidities Other;Comorbidity 1;Age   ? Comorbidities OA, Scoliosis.   ? Examination-Activity Limitations Other;Locomotion Level;Stand   ? Examination-Participation Restrictions Other;Meal Prep   ? Stability/Clinical Decision Making Evolving/Moderate complexity   ? PT Frequency 2x / week   ? PT Duration 6 weeks   ? PT Treatment/Interventions ADLs/Self Care Home Management;Cryotherapy;Electrical Stimulation;Ultrasound;Traction;Moist Heat;Therapeutic activities;Therapeutic exercise;Manual techniques;Patient/family education;Passive range of motion;Dry needling   ? PT Next Visit Plan Patient going to try reducing to once a week to see how she does.  Continue with POC.   ? Consulted and Agree with Plan of Care Patient   ? ?  ?  ? ?  ? ? ?Patient will benefit from skilled therapeutic intervention in order to improve the following deficits and impairments:  Abnormal gait, Decreased activity tolerance, Decreased range of motion, Decreased strength, Increased muscle spasms, Postural dysfunction ? ?Visit Diagnosis: ?Chronic right-sided low back pain with right-sided sciatica ? ?Abnormal posture ? ? ? ? ?Problem List ?Patient Active Problem List  ? Diagnosis Date Noted  ? Cough 05/29/2020  ? Chest congestion 05/29/2020  ? Arthritis 06/13/2019  ? GERD (gastroesophageal reflux disease) 06/10/2019  ? Esophageal stricture 06/10/2019  ? Educated about COVID-19 virus infection 06/04/2019  ? SVT (supraventricular tachycardia) ([place]) 08/28/2018  ? Elevated troponin 08/28/2018  ? Light headedness 05/09/2018  ? Carpal tunnel syndrome of right wrist 05/20/2015  ? Primary localized osteoarthrosis, hand 05/20/2015   ? Carpal tunnel syndrome on left 03/18/2015  ? Osteopenia of the elderly 02/19/2014  ? DDD (degenerative disc disease), lumbar 12/03/2013  ? Scoliosis 12/03/2013  ? Chronic LBP 05/14/2013  ? IBS (irritable bowel syndrome) 11/08/2012  ? UC (ulcerative colitis) ([place]) 04/23/2012  ? Palpitation 10/26/2011  ? Hypertension  10/26/2011  ? Chronic ulcerative colitis ([place]) 03/23/2011  ? ? ?, [place], PT ?07/26/2021, 3:06 PM ? ?[place] ?[facility] ?[place] ?[place], [place], [address] ?Phone: [phone]   Fax:  [phone] ? ?Name: Lori Soto ?MRN: [id number] ?Date of Birth: 10-02-1935 ? ? ? ?

## 2021-08-02 ENCOUNTER — Ambulatory Visit: Payer: Medicare Other | Attending: Family Medicine | Admitting: Physical Therapy

## 2021-08-02 ENCOUNTER — Encounter: Payer: Self-pay | Admitting: Physical Therapy

## 2021-08-02 DIAGNOSIS — M5441 Lumbago with sciatica, right side: Secondary | ICD-10-CM | POA: Diagnosis not present

## 2021-08-02 DIAGNOSIS — G8929 Other chronic pain: Secondary | ICD-10-CM | POA: Insufficient documentation

## 2021-08-02 DIAGNOSIS — M6281 Muscle weakness (generalized): Secondary | ICD-10-CM | POA: Diagnosis not present

## 2021-08-02 DIAGNOSIS — R293 Abnormal posture: Secondary | ICD-10-CM | POA: Diagnosis not present

## 2021-08-02 NOTE — Therapy (Signed)
?[facility] ?[place] ?[place], [place], [address] ?Phone: [phone]   Fax:  [phone] ? ?Physical Therapy Treatment ? ?Patient Details  ?Name: Lori Soto ?MRN: [id number] ?Date of Birth: [birth year] ?Referring Provider (PT): Ronnie Doss DO. ? ? ?Encounter Date: 08/02/2021 ? ? PT End of Session - 08/02/21 1406   ? ? Visit Number 15   ? Number of Visits 16   ? Date for PT Re-Evaluation 08/18/21   ? Authorization Type PROGRESS NOTE AT 10TH VISIT.  KX MODIFIER AFTER 15 VISITS.   ? PT Start Time 8242   ? PT Stop Time 3536   ? PT Time Calculation (min) 54 min   ? Activity Tolerance Patient tolerated treatment well   ? Behavior During Therapy [facility] for tasks assessed/performed   ? ?  ?  ? ?  ? ? ?Past Medical History:  ?Diagnosis Date  ? Anemia   ? Arthritis   ? Cancer [facility])   ? skin cancer on nose  ? Carpal tunnel syndrome, bilateral   ? Cataract   ? Esophagitis   ? GERD (gastroesophageal reflux disease)   ? H/O hiatal hernia   ? Osteopenia   ? Scoliosis   ? Shingles   ? SVT (supraventricular tachycardia) ([place])   ? Ulcerative colitis   ? ? ?Past Surgical History:  ?Procedure Laterality Date  ? ABDOMINAL HYSTERECTOMY    ? BUNIONECTOMY    ? Bilateral  ? CARPAL TUNNEL RELEASE Left 03/10/2015  ? Procedure: LEFT CARPAL TUNNEL RELEASE;  Surgeon: Daryll Brod, MD;  Location: [place];  Service: Orthopedics;  Laterality: Left;  ? CARPAL TUNNEL RELEASE Right 10/31/2017  ? Procedure: RIGHT CARPAL TUNNEL RELEASE;  Surgeon: Daryll Brod, MD;  Location: [place];  Service: Orthopedics;  Laterality: Right;  ? CATARACT EXTRACTION W/PHACO  05/28/2012  ? Procedure: CATARACT EXTRACTION PHACO AND INTRAOCULAR LENS PLACEMENT (IOC);  Surgeon: Tonny Branch, MD;  [facility];  Service: Ophthalmology;  Laterality: Right;  CDE=12.84  ? CATARACT EXTRACTION W/PHACO  06/07/2012  ? Procedure: CATARACT EXTRACTION PHACO AND INTRAOCULAR LENS PLACEMENT (IOC);  Surgeon:  Tonny Branch, MD;  [facility];  Service: Ophthalmology;  Laterality: Left;  CDE 17.60  ? COLONOSCOPY    ? COLONOSCOPY N/A 07/08/2015  ? Procedure: COLONOSCOPY;  Surgeon: Rogene Houston, MD;  [facility];  Service: Endoscopy;  Laterality: N/A;  930  ? ESOPHAGEAL DILATION N/A 07/08/2015  ? Procedure: ESOPHAGEAL DILATION;  Surgeon: Rogene Houston, MD;  [facility];  Service: Endoscopy;  Laterality: N/A;  ? ESOPHAGOGASTRODUODENOSCOPY N/A 07/08/2015  ? Procedure: ESOPHAGOGASTRODUODENOSCOPY (EGD);  Surgeon: Rogene Houston, MD;  [facility];  Service: Endoscopy;  Laterality: N/A;  ? Hernia    ? Right inguinal  ? HERNIA REPAIR  1961  ? right inguinal hernia  ? UPPER GASTROINTESTINAL ENDOSCOPY    ? ? ?There were no vitals filed for this visit. ? ? Subjective Assessment - 08/02/21 1404   ? ? Subjective COVID-19 screen performed prior to patient entering clinic. Paitent reports more R LBP/hip pain today. Very busy the last few days at church and cleaned baseboards last week as well.   ? Pertinent History OA, Scoliosis.   ? How long can you sit comfortably? Unlimited.   ? How long can you stand comfortably? Varies.   ? How long can you walk comfortably? Varies.   ? Patient Stated Goals Get around better with less  pain.   ? Currently in Pain? Yes   ? Pain Score 8    ? Pain Location Hip   ? Pain Orientation Right   ? Pain Descriptors / Indicators Discomfort   ? Pain Type Chronic pain   ? Pain Onset More than a month ago   ? Pain Frequency Constant   ? ?  ?  ? ?  ? ? ? ? ? [facility] PT Assessment - 08/02/21 0001   ? ?  ? Assessment  ? Medical Diagnosis Acute right-sided low back pain with bilateral sciatica.   ? Referring Provider (PT) Ronnie Doss DO.   ?  ? Restrictions  ? Weight Bearing Restrictions No   ? ?  ?  ? ?  ? ? ? ? ? ? ? ? ? ? ? ? ? ? ? ? [place] Adult PT Treatment/Exercise - 08/02/21 0001   ? ?  ? Lumbar Exercises: Aerobic  ? Nustep Level 3 x 11 minutes.   ?  ? Modalities  ?  Modalities Electrical Stimulation;Traction   ?  ? Electrical Stimulation  ? Electrical Stimulation Location B low back   ? Electrical Stimulation Action Pre-Mod   ? Electrical Stimulation Parameters 80-150 hz x10 min   ? Electrical Stimulation Goals Tone;Pain   ?  ? Traction  ? Type of Traction Lumbar   ? Min (lbs) 5   ? Max (lbs) 80   ? Hold Time 99   ? Rest Time 5   ? Time 15   ? ?  ?  ? ?  ? ? ? ? ? ? ? ? ? ? ? ? ? ? ? PT Long Term Goals - 07/12/21 1113   ? ?  ? PT LONG TERM GOAL #1  ? Title Patient will be independent with an advanced HEP   ? Baseline met 09/05/19   ? Time 6   ? Period Weeks   ? Status On-going   ?  ? PT LONG TERM GOAL #2  ? Title Patient will report ability to perfrom ADLs with low back pain less than or equal to 4/10   ? Time 6   ? Period Weeks   ? Status Partially Met   ?  ? PT LONG TERM GOAL #3  ? Title Stand 30 minutes with pain not > 4/10.   ? Time 6   ? Period Weeks   ? Status Partially Met   ?  ? PT LONG TERM GOAL #4  ? Title Walk a community distance with pain not > 4/10 and no radiation into the right buttock and posterior thigh.   ? Time 6   ? Period Weeks   ? Status Partially Met   ? ?  ?  ? ?  ? ? ? ? ? ? ? ? Plan - 08/02/21 1506   ? ? Clinical Impression Statement Patient presented in clinic with reports of more R hip/ LBP. Patient had been more busy over the last few days with church activities as well as cleaning her home with activities such as cleaning baseboards. Patient had no complaints of any new pain during Nustep treatment in general. States that traction helps the most with normal response at 80# max.   ? Personal Factors and Comorbidities Other;Comorbidity 1;Age   ? Comorbidities OA, Scoliosis.   ? Examination-Activity Limitations Other;Locomotion Level;Stand   ? Examination-Participation Restrictions Other;Meal Prep   ? Stability/Clinical Decision Making Evolving/Moderate complexity   ? PT Frequency 2x /  week   ? PT Duration 6 weeks   ? PT Treatment/Interventions  ADLs/Self Care Home Management;Cryotherapy;Electrical Stimulation;Ultrasound;Traction;Moist Heat;Therapeutic activities;Therapeutic exercise;Manual techniques;Patient/family education;Passive range of motion;Dry needling   ? PT Next Visit Plan Patient going to try reducing to once a week to see how she does.  Continue with POC.   ? Consulted and Agree with Plan of Care Patient   ? ?  ?  ? ?  ? ? ?Patient will benefit from skilled therapeutic intervention in order to improve the following deficits and impairments:  Abnormal gait, Decreased activity tolerance, Decreased range of motion, Decreased strength, Increased muscle spasms, Postural dysfunction ? ?Visit Diagnosis: ?Chronic right-sided low back pain with right-sided sciatica ? ?Abnormal posture ? ?Muscle weakness (generalized) ? ? ? ? ?Problem List ?Patient Active Problem List  ? Diagnosis Date Noted  ? Cough 05/29/2020  ? Chest congestion 05/29/2020  ? Arthritis 06/13/2019  ? GERD (gastroesophageal reflux disease) 06/10/2019  ? Esophageal stricture 06/10/2019  ? Educated about COVID-19 virus infection 06/04/2019  ? SVT (supraventricular tachycardia) ([place]) 08/28/2018  ? Elevated troponin 08/28/2018  ? Light headedness 05/09/2018  ? Carpal tunnel syndrome of right wrist 05/20/2015  ? Primary localized osteoarthrosis, hand 05/20/2015  ? Carpal tunnel syndrome on left 03/18/2015  ? Osteopenia of the elderly 02/19/2014  ? DDD (degenerative disc disease), lumbar 12/03/2013  ? Scoliosis 12/03/2013  ? Chronic LBP 05/14/2013  ? IBS (irritable bowel syndrome) 11/08/2012  ? UC (ulcerative colitis) ([place]) 04/23/2012  ? Palpitation 10/26/2011  ? Hypertension 10/26/2011  ? Chronic ulcerative colitis ([place]) 03/23/2011  ? ? ?Standley Brooking, PTA ?08/02/2021, 3:58 PM ? ?[place] ?[facility] ?[place] ?[place], [place], [address] ?Phone: [phone]   Fax:  [phone] ? ?Name: Lori Soto ?MRN: [id number] ?Date of Birth:  March 27, 1936 ? ? ? ?

## 2021-08-09 ENCOUNTER — Ambulatory Visit: Payer: Medicare Other | Admitting: Physical Therapy

## 2021-08-09 ENCOUNTER — Encounter: Payer: Self-pay | Admitting: Physical Therapy

## 2021-08-09 DIAGNOSIS — M6281 Muscle weakness (generalized): Secondary | ICD-10-CM | POA: Diagnosis not present

## 2021-08-09 DIAGNOSIS — G8929 Other chronic pain: Secondary | ICD-10-CM

## 2021-08-09 DIAGNOSIS — R293 Abnormal posture: Secondary | ICD-10-CM | POA: Diagnosis not present

## 2021-08-09 DIAGNOSIS — M5441 Lumbago with sciatica, right side: Secondary | ICD-10-CM | POA: Diagnosis not present

## 2021-08-09 NOTE — Therapy (Addendum)
[place] ?[facility] ?[place] ?[place], [place], [address] ?Phone: [phone]   Fax:  [phone] ? ?Physical Therapy Treatment ? ?Patient Details  ?Name: Lori Soto ?MRN: [id number] ?Date of Birth: 1935-09-14 ?Referring Provider (PT): Ronnie Doss DO. ? ? ?Encounter Date: 08/09/2021 ? ? PT End of Session - 08/09/21 1358   ? ? Visit Number 16   ? Number of Visits 16   ? Date for PT Re-Evaluation 08/18/21   ? Authorization Type PROGRESS NOTE AT 10TH VISIT.  KX MODIFIER AFTER 15 VISITS.   ? PT Start Time 1400   ? PT Stop Time 2423   ? PT Time Calculation (min) 37 min   ? Activity Tolerance Patient tolerated treatment well   ? Behavior During Therapy [facility] for tasks assessed/performed   ? ?  ?  ? ?  ? ? ?Past Medical History:  ?Diagnosis Date  ? Anemia   ? Arthritis   ? Cancer [facility])   ? skin cancer on nose  ? Carpal tunnel syndrome, bilateral   ? Cataract   ? Esophagitis   ? GERD (gastroesophageal reflux disease)   ? H/O hiatal hernia   ? Osteopenia   ? Scoliosis   ? Shingles   ? SVT (supraventricular tachycardia) ([place])   ? Ulcerative colitis   ? ? ?Past Surgical History:  ?Procedure Laterality Date  ? ABDOMINAL HYSTERECTOMY    ? BUNIONECTOMY    ? Bilateral  ? CARPAL TUNNEL RELEASE Left 03/10/2015  ? Procedure: LEFT CARPAL TUNNEL RELEASE;  Surgeon: Daryll Brod, MD;  Location: [place];  Service: Orthopedics;  Laterality: Left;  ? CARPAL TUNNEL RELEASE Right 10/31/2017  ? Procedure: RIGHT CARPAL TUNNEL RELEASE;  Surgeon: Daryll Brod, MD;  Location: [place];  Service: Orthopedics;  Laterality: Right;  ? CATARACT EXTRACTION W/PHACO  05/28/2012  ? Procedure: CATARACT EXTRACTION PHACO AND INTRAOCULAR LENS PLACEMENT (IOC);  Surgeon: Tonny Branch, MD;  [facility];  Service: Ophthalmology;  Laterality: Right;  CDE=12.84  ? CATARACT EXTRACTION W/PHACO  06/07/2012  ? Procedure: CATARACT EXTRACTION PHACO AND INTRAOCULAR LENS PLACEMENT (IOC);  Surgeon:  Tonny Branch, MD;  [facility];  Service: Ophthalmology;  Laterality: Left;  CDE 17.60  ? COLONOSCOPY    ? COLONOSCOPY N/A 07/08/2015  ? Procedure: COLONOSCOPY;  Surgeon: Rogene Houston, MD;  [facility];  Service: Endoscopy;  Laterality: N/A;  930  ? ESOPHAGEAL DILATION N/A 07/08/2015  ? Procedure: ESOPHAGEAL DILATION;  Surgeon: Rogene Houston, MD;  [facility];  Service: Endoscopy;  Laterality: N/A;  ? ESOPHAGOGASTRODUODENOSCOPY N/A 07/08/2015  ? Procedure: ESOPHAGOGASTRODUODENOSCOPY (EGD);  Surgeon: Rogene Houston, MD;  [facility];  Service: Endoscopy;  Laterality: N/A;  ? Hernia    ? Right inguinal  ? HERNIA REPAIR  1961  ? right inguinal hernia  ? UPPER GASTROINTESTINAL ENDOSCOPY    ? ? ?There were no vitals filed for this visit. ? ? Subjective Assessment - 08/09/21 1358   ? ? Subjective reports that she can do her ADLs but just needs rest breaks.   ? Pertinent History OA, Scoliosis.   ? How long can you sit comfortably? Unlimited.   ? How long can you stand comfortably? Varies.   ? How long can you walk comfortably? Varies.   ? Patient Stated Goals Get around better with less pain.   ? Currently in Pain? Other (Comment)   No pain assessment provided  ? ?  ?  ? ?  ? ? ? ? ?  [facility] PT Assessment - 08/09/21 0001   ? ?  ? Assessment  ? Medical Diagnosis Acute right-sided low back pain with bilateral sciatica.   ? Referring Provider (PT) Ronnie Doss DO.   ? ?  ?  ? ?  ? ? ? ? ? ? ? ? ? ? ? ? ? ? ? ? [place] Adult PT Treatment/Exercise - 08/09/21 0001   ? ?  ? Lumbar Exercises: Aerobic  ? Nustep Level 3 x 12 minutes.   ?  ? Modalities  ? Modalities Traction   ?  ? Traction  ? Type of Traction Lumbar   ? Min (lbs) 5   ? Max (lbs) 80   ? Hold Time 99   ? Rest Time 5   ? Time 15   ? ?  ?  ? ?  ? ? ? ? ? ? ? ? ? ? ? ? ? ? ? PT Long Term Goals - 08/09/21 1411   ? ?  ? PT LONG TERM GOAL #1  ? Title Patient will be independent with an advanced HEP   ? Baseline met 09/05/19   ? Time 6   ?  Period Weeks   ? Status Unable to assess   ?  ? PT LONG TERM GOAL #2  ? Title Patient will report ability to perfrom ADLs with low back pain less than or equal to 4/10   ? Time 6   ? Period Weeks   ? Status Partially Met   ?  ? PT LONG TERM GOAL #3  ? Title Stand 30 minutes with pain not > 4/10.   ? Time 6   ? Period Weeks   ? Status Achieved   ?  ? PT LONG TERM GOAL #4  ? Title Walk a community distance with pain not > 4/10 and no radiation into the right buttock and posterior thigh.   ? Time 6   ? Period Weeks   ? Status Partially Met   ? ?  ?  ? ?  ? ? ? ? ? ? ? ? Plan - 08/09/21 1441   ? ? Clinical Impression Statement Patient presented in clinic with reports of continued LBP which is causing more R buttock pain. Patient late to PT which limited treatment. Patient able to stand and more functional now although some LBP present. Patient instructed in figure 4 stretch to assist with buttock related pain. Most goals were partially met as she requires some rest breaks throughout ADLs to control pain. Normal traction response to 80# max.   ? Personal Factors and Comorbidities Other;Comorbidity 1;Age   ? Comorbidities OA, Scoliosis.   ? Examination-Activity Limitations Other;Locomotion Level;Stand   ? Examination-Participation Restrictions Other;Meal Prep   ? Stability/Clinical Decision Making Evolving/Moderate complexity   ? PT Frequency 2x / week   ? PT Duration 6 weeks   ? PT Treatment/Interventions ADLs/Self Care Home Management;Cryotherapy;Electrical Stimulation;Ultrasound;Traction;Moist Heat;Therapeutic activities;Therapeutic exercise;Manual techniques;Patient/family education;Passive range of motion;Dry needling   ? PT Next Visit Plan D/C   ? Consulted and Agree with Plan of Care Patient   ? ?  ?  ? ?  ? ? ?Patient will benefit from skilled therapeutic intervention in order to improve the following deficits and impairments:  Abnormal gait, Decreased activity tolerance, Decreased range of motion, Decreased  strength, Increased muscle spasms, Postural dysfunction ? ?Visit Diagnosis: ?Chronic right-sided low back pain with right-sided sciatica ? ?Abnormal posture ? ? ? ? ?Problem List ?Patient Active Problem  List  ? Diagnosis Date Noted  ? Cough 05/29/2020  ? Chest congestion 05/29/2020  ? Arthritis 06/13/2019  ? GERD (gastroesophageal reflux disease) 06/10/2019  ? Esophageal stricture 06/10/2019  ? Educated about COVID-19 virus infection 06/04/2019  ? SVT (supraventricular tachycardia) ([place]) 08/28/2018  ? Elevated troponin 08/28/2018  ? Light headedness 05/09/2018  ? Carpal tunnel syndrome of right wrist 05/20/2015  ? Primary localized osteoarthrosis, hand 05/20/2015  ? Carpal tunnel syndrome on left 03/18/2015  ? Osteopenia of the elderly 02/19/2014  ? DDD (degenerative disc disease), lumbar 12/03/2013  ? Scoliosis 12/03/2013  ? Chronic LBP 05/14/2013  ? IBS (irritable bowel syndrome) 11/08/2012  ? UC (ulcerative colitis) ([place]) 04/23/2012  ? Palpitation 10/26/2011  ? Hypertension 10/26/2011  ? Chronic ulcerative colitis ([place]) 03/23/2011  ? ? ?Standley Brooking, PTA ?08/09/2021, 2:46 PM ? ?[place] ?[facility] ?[place] ?[place], [place], [address] ?Phone: [phone]   Fax:  [phone] ? ?Name: Gwenivere Hiraldo ?MRN: [id number] ?Date of Birth: 1936/03/13 ? ? ?PHYSICAL THERAPY DISCHARGE SUMMARY ? ?Visits from Start of Care: 16. ? ?Current functional level related to goals / functional outcomes: ?See above. ?  ?Remaining deficits: ?See goal section. ?  ?Education / Equipment: ?HEP.  ? ?Patient agrees to discharge. Patient goals were partially met. Patient is being discharged due to   . ? ? ? ?Mali Applegate MPT ? ? ?

## 2021-08-10 ENCOUNTER — Telehealth: Payer: Self-pay

## 2021-08-10 NOTE — Telephone Encounter (Signed)
? ?  Telephone encounter was:  Successful.  ?08/10/2021 ?Name: Lori Soto MRN: [id number] DOB: 17-Jun-1935 ? ?Lori Soto is a 86 y.o. year old female who is a primary care patient of [name], DO . The community resource team was consulted for assistance with mail follow up ? ?Care guide performed the following interventions: Follow up call placed to the patient to discuss status of referral. ? ?Follow Up Plan:  No further follow up planned at this time. The patient has been provided with needed resources. ? ? ? ?Larena Sox ?Care Guide, Embedded Care Coordination ?[place], Care Management  ?[phone] ?[address], [place], [place] [address] ?Phone: [phone] ?Email: [email or web address] ? ?  ?

## 2021-09-25 ENCOUNTER — Other Ambulatory Visit: Payer: Self-pay | Admitting: Cardiology

## 2021-10-05 ENCOUNTER — Emergency Department (HOSPITAL_COMMUNITY)
Admission: EM | Admit: 2021-10-05 | Discharge: 2021-10-05 | Disposition: A | Discharge status: Home / Self Care | Payer: Medicare Other | Attending: Emergency Medicine | Admitting: Emergency Medicine

## 2021-10-05 ENCOUNTER — Encounter (HOSPITAL_COMMUNITY): Payer: Self-pay | Admitting: Emergency Medicine

## 2021-10-05 ENCOUNTER — Emergency Department (HOSPITAL_COMMUNITY): Payer: Medicare Other

## 2021-10-05 ENCOUNTER — Telehealth: Payer: Self-pay | Admitting: Cardiology

## 2021-10-05 ENCOUNTER — Other Ambulatory Visit: Payer: Self-pay

## 2021-10-05 DIAGNOSIS — I471 Supraventricular tachycardia: Secondary | ICD-10-CM | POA: Diagnosis not present

## 2021-10-05 DIAGNOSIS — R002 Palpitations: Secondary | ICD-10-CM | POA: Diagnosis present

## 2021-10-05 DIAGNOSIS — I4891 Unspecified atrial fibrillation: Secondary | ICD-10-CM | POA: Diagnosis not present

## 2021-10-05 DIAGNOSIS — K449 Diaphragmatic hernia without obstruction or gangrene: Secondary | ICD-10-CM | POA: Diagnosis not present

## 2021-10-05 LAB — CBC
HCT: 44.6 % (ref 36.0–46.0)
Hemoglobin: 14.5 g/dL (ref 12.0–15.0)
MCH: 29.5 pg (ref 26.0–34.0)
MCHC: 32.5 g/dL (ref 30.0–36.0)
MCV: 90.7 fL (ref 80.0–100.0)
Platelets: 309 10*3/uL (ref 150–400)
RBC: 4.92 MIL/uL (ref 3.87–5.11)
RDW: 13.5 % (ref 11.5–15.5)
WBC: 6.9 10*3/uL (ref 4.0–10.5)
nRBC: 0 % (ref 0.0–0.2)

## 2021-10-05 LAB — BASIC METABOLIC PANEL
Anion gap: 11 (ref 5–15)
BUN: 13 mg/dL (ref 8–23)
CO2: 28 mmol/L (ref 22–32)
Calcium: 9.5 mg/dL (ref 8.9–10.3)
Chloride: 100 mmol/L (ref 98–111)
Creatinine, Ser: 0.83 mg/dL (ref 0.44–1.00)
GFR, Estimated: >60 mL/min (ref >60)
Glucose, Bld: 135 mg/dL — ABNORMAL HIGH (ref 70–99)
Potassium: 4.2 mmol/L (ref 3.5–5.1)
Sodium: 139 mmol/L (ref 135–145)

## 2021-10-05 MED ORDER — ADENOSINE 6 MG/2ML IV SOLN
6.0000 mg | Freq: Once | INTRAVENOUS | Status: AC
  Administered 2021-10-05: 6 mg via INTRAVENOUS

## 2021-10-05 MED ORDER — ALUM & MAG HYDROXIDE-SIMETH 200-200-20 MG/5ML PO SUSP
30.0000 mL | Freq: Once | ORAL | Status: AC
  Administered 2021-10-05: 30 mL via ORAL
  Filled 2021-10-05: qty 30

## 2021-10-05 NOTE — ED Triage Notes (Signed)
Pt having heart palpitations started this am, per PCP took 2 metoprolol's, HR-155 and hypotensive.

## 2021-10-05 NOTE — Telephone Encounter (Signed)
-  Pt's sister called stating pt's HR has been elevated for roughly an hour.  -She state HR was originally 169 but took an extra metoprolol but HR only decreased to 155 -Pt asymptomatic but BP 98/75  Pt encouraged to contact EMS. Pt verbalized understanding.

## 2021-10-05 NOTE — ED Provider Notes (Signed)
[facility] EMERGENCY DEPARTMENT Provider Note   CSN: [id number] Arrival date & time: 10/05/21  1206     History  Chief Complaint  Patient presents with   Palpitations    Lori Soto is a 86 y.o. female.  HPI Patient presents with a female companion who assists with the history.  She presents due to palpitations.  She notes that she has a history of SVT, and a similar prior episode of palpitations she took additional doses of beta-blocker.  After this did not resolve her palpitations she presents for evaluation.  She was well prior to the onset of symptoms, describes palpitations as uncomfortable, with diminished ability to perform ADL secondary to fatigue, but without true dyspnea. No fever, chills, nausea, vomiting.    Home Medications Prior to Admission medications   Medication Sig Start Date End Date Taking? Authorizing Provider  acetaminophen (TYLENOL) 650 MG CR tablet Take 650 mg by mouth every 8 (eight) hours as needed. Patient states that she takes prn for arthritis in her knees.    [provider]  Bromelains 500 MG TABS Take 500 mg by mouth daily.    [provider]  Coenzyme Q10 (CO Q-10 PO) Take 1 tablet by mouth daily. With red yeast rice [employer]    [provider]  COLLAGEN PO Take 1 tablet by mouth daily. This is known as 1-2-3    [provider]  Cyanocobalamin (VITAMIN B 12 PO) Take 1,000 mcg by mouth daily.    [provider]  Ginkgo Biloba 40 MG TABS Take 40 mg by mouth daily.    [provider]  glucosamine-chondroitin 500-400 MG tablet Take 1 tablet by mouth 2 (two) times daily.    [provider]  Hyaluronic Acid-Vitamin C (HYALURONIC ACID PO) Take 80 mg by mouth daily.    [provider]  Mesalamine (ASACOL) 400 MG CPDR DR capsule TAKE 2 CAPSULES DAILY 04/19/21   Carlan, Chelsea L, NP  metoprolol tartrate (LOPRESSOR) 25 MG tablet TAKE 1 TABLET TWICE A DAY 09/28/21    Minus Breeding, MD  MULTIPLE VITAMINS PO Take 1 tablet by mouth daily. In the mornings    [provider]  Nutritional Supplements (GRAPESEED EXTRACT PO) Take 60 mg by mouth daily.     [provider]  OAT BRAN SOLUBLE PO Take 1 tablet by mouth daily.    [provider]  OVER THE COUNTER MEDICATION Take 66 mg by mouth daily. Vision Gold Lutein    [provider]  OVER THE COUNTER MEDICATION Take 250 mg by mouth daily. Alphalipoic Acid 250 mg daily    [provider]  pantoprazole (PROTONIX) 20 MG tablet Take 1 tablet (20 mg total) by mouth daily before breakfast. 06/29/21   Rehman, Mechele Dawley, MD  pyridOXINE (VITAMIN B-6) 100 MG tablet Take 100 mg by mouth daily.    [provider]  Turmeric 500 MG CAPS Take 1 capsule by mouth 3 (three) times daily.     [provider]  Vitamins C E (CRANBERRY CONCENTRATE PO) Take 2,500 mg by mouth daily.    [provider]  Zinc 50 MG CAPS Take 50 mg by mouth daily.    [provider]      Allergies    Penicillins, Influenza virus vacc split pf, and Tape    Review of Systems   Review of Systems  All other systems reviewed and are negative.  Physical Exam Updated Vital Signs BP  101/75   Pulse (!) 149   Temp (!) 97.5 F (36.4 C) (Oral)   Resp (!) 23   Ht 5' (1.524 m)   Wt 58.1 kg   SpO2 94%   BMI 25.00 kg/m  Physical Exam Vitals and nursing note reviewed.  Constitutional:      General: She is not in acute distress.    Appearance: She is well-developed.  HENT:     Head: Normocephalic and atraumatic.  Eyes:     Conjunctiva/sclera: Conjunctivae normal.  Cardiovascular:     Rate and Rhythm: Regular rhythm. Tachycardia present.  Pulmonary:     Effort: Pulmonary effort is normal. No respiratory distress.     Breath sounds: Normal breath sounds. No stridor.  Abdominal:     General: There is no distension.  Skin:    General: Skin is warm and dry.  Neurological:      Mental Status: She is alert and oriented to person, place, and time.     Cranial Nerves: No cranial nerve deficit.  Psychiatric:        Mood and Affect: Mood normal.    ED Results / Procedures / Treatments   Labs (all labs ordered are listed, but only abnormal results are displayed) Labs Reviewed  CBC  BASIC METABOLIC PANEL     EKG #1 SVT, rate 156, ST wave changes, abnormal   EKG #2 EKG Interpretation  Date/Time:  Tuesday October 05 2021 13:12:32 EDT Ventricular Rate:  69 PR Interval:  140 QRS Duration: 84 QT Interval:  407 QTC Calculation: 436 R Axis:   76 Text Interpretation: Sinus rhythm Probable left atrial enlargement Borderline low voltage, extremity leads Minimal ST depression, diffuse leads improved since prior Confirmed by Carmin Muskrat [id number]) on 10/05/2021 1:26:02 PM  Radiology No results found.  Procedures .Cardioversion  Date/Time: 10/05/2021 12:40 PM Performed by: Carmin Muskrat, MD Authorized by: Carmin Muskrat, MD   Consent:    Consent obtained:  Verbal   Consent given by:  Patient   Risks discussed:  Induced arrhythmia and pain   Alternatives discussed:  Rate-control medication and alternative treatment Universal protocol:    Procedure explained and questions answered to patient or proxy's satisfaction: yes     Relevant documents present and verified: yes     Required blood products, implants, devices, and special equipment available: yes     Immediately prior to procedure a time out was called: yes     Patient identity confirmed:  Verbally with patient Pre-procedure details:    Cardioversion basis:  Emergent   Rhythm:  Supraventricular tachycardia Patient sedated: No Attempt one:    Shock outcome:  Conversion to normal sinus rhythm Comments:     With adenosine 6 mg patient had successful chemical cardioversion to sinus rhythm.     Medications Ordered in ED Medications  adenosine (ADENOCARD) 6 MG/2ML injection 6 mg (6 mg Intravenous  Given 10/05/21 1308)    ED Course/ Medical Decision Making/ A&P  This patient with a Hx of SVT presents to the ED for concern of palpitations, fatigue, this involves an extensive number of treatment options, and is a complaint that carries with it a high risk of complications and morbidity.    The differential diagnosis includes VTE, pneumonia, bacteremia, sepsis, dehydration   Social Determinants of Health:  Advanced age  Additional history obtained:  Additional history and/or information obtained from chart review, notable for patient's cardiology note from recent visit with history of SVT, ongoing medication with beta-blocker for control  After the initial evaluation, orders, including: Labs monitoring were initiated.   Patient placed on Cardiac and Pulse-Oximetry Monitors. The patient was maintained on a cardiac monitor.  The cardiac monitored showed an rhythm of 150 SVT abnormal The patient was also maintained on pulse oximetry. The readings were typically 99% room air normal   On repeat evaluation of the patient improved Following cardioversion patient was substantially better, calm. Lab Tests:  I personally interpreted labs.  The pertinent results include: No notable abnormalities  Imaging Studies ordered:  I independently visualized and interpreted imaging which showed no notable abnormalities I agree with the radiologist interpretation  Dispostion / Final MDM:  After consideration of the diagnostic results and the patient's response to treatment, this adult female with history of SVT, otherwise generally well presents with palpitations and is found to have SVT requiring cardioversion performed without complication here.  Patient monitored for hours afterwards had no recurrence, no ongoing pain, without evidence for other acute new findings, patient discharged in stable condition.    Final Clinical Impression(s) / ED Diagnoses Final diagnoses:  SVT  (supraventricular tachycardia) ([place])   CRITICAL CARE Performed by: Carmin Muskrat Total critical care time: 35 minutes Critical care time was exclusive of separately billable procedures and treating other patients. Critical care was necessary to treat or prevent imminent or life-threatening deterioration. Critical care was time spent personally by me on the following activities: development of treatment plan with patient and/or surrogate as well as nursing, discussions with consultants, evaluation of patient's response to treatment, examination of patient, obtaining history from patient or surrogate, ordering and performing treatments and interventions, ordering and review of laboratory studies, ordering and review of radiographic studies, pulse oximetry and re-evaluation of patient's condition.    Carmin Muskrat, MD 10/05/21 1447

## 2021-10-05 NOTE — Discharge Instructions (Addendum)
Monitor your condition carefully and do not hesitate to return here for concerning changes.

## 2021-10-05 NOTE — Telephone Encounter (Signed)
STAT if HR is under 50 or over 120 (normal HR is 60-100 beats per minute)  What is your heart rate? 156  Do you have a log of your heart rate readings (document readings)? 169, 160, 156  Do you have any other symptoms? no

## 2021-10-07 DIAGNOSIS — H353111 Nonexudative age-related macular degeneration, right eye, early dry stage: Secondary | ICD-10-CM | POA: Diagnosis not present

## 2021-10-11 NOTE — Progress Notes (Signed)
Cardiology Office Note   Date:  10/12/2021   ID:  Lori Soto, DOB 08-08-1935, MRN [id number]  PCP:  Lori Norlander, DO  Cardiologist:   Lori Breeding, MD   Chief Complaint  Patient presents with   Palpitations       History of Present Illness: Lori Soto is a 86 y.o. female who presents for evaluation of palpitations.   I saw her for evaluation of palpitations in 2014.  She has a heart murmur and an echo in 2011 was normal.  She was in in the ED in August. 2018 but there was no recorded arrhythmia.  She had palpitations but these were infrequent so no further work up was suggested.   She was in the ED in April 2020 with SVT.  She had mildly elevated troponin.   She had an SVT with a rate of 174 She was treated with adenosine.     She was treated with metoprolol.   She had ED presentation in August 2022 requiring adenosine.  She was in the ED most recently 7 days ago with SVT.  I reviewed these records for this visit.   She required treatment with adenosine.    She has been under a lot of stress recently.  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  She waited about half an hour doing a Valsalva maneuver and extra beta-blocker.  However it did not abate she drove to [facility].  I did review these records and she had some chest discomfort but there were no troponins drawn.  She otherwise has felt okay. The patient denies any new symptoms such as chest discomfort, neck or arm discomfort. There has been no new shortness of breath, PND or orthopnea. There have been no reported palpitations, presyncope or syncope.   Past Medical History:  Diagnosis Date   Anemia    Arthritis    Cancer ([place])    skin cancer on nose   Carpal tunnel syndrome, bilateral    Cataract    Esophagitis    GERD (gastroesophageal reflux disease)    H/O hiatal hernia    Osteopenia    Scoliosis    Shingles    SVT  (supraventricular tachycardia) ([facility])    Ulcerative colitis     Past Surgical History:  Procedure Laterality Date   ABDOMINAL HYSTERECTOMY     BUNIONECTOMY     Bilateral   CARPAL TUNNEL RELEASE Left 03/10/2015   Procedure: LEFT CARPAL TUNNEL RELEASE;  Surgeon: Daryll Brod, MD;  Location: [place];  Service: Orthopedics;  Laterality: Left;   CARPAL TUNNEL RELEASE Right 10/31/2017   Procedure: RIGHT CARPAL TUNNEL RELEASE;  Surgeon: Daryll Brod, MD;  Location: [place];  Service: Orthopedics;  Laterality: Right;   CATARACT EXTRACTION W/PHACO  05/28/2012   Procedure: CATARACT EXTRACTION PHACO AND INTRAOCULAR LENS PLACEMENT (IOC);  Surgeon: Tonny Branch, MD;  Location: [facility];  Service: Ophthalmology;  Laterality: Right;  CDE=12.84   CATARACT EXTRACTION W/PHACO  06/07/2012   Procedure: CATARACT EXTRACTION PHACO AND INTRAOCULAR LENS PLACEMENT (IOC);  Surgeon: Tonny Branch, MD;  Location: [facility];  Service: Ophthalmology;  Laterality: Left;  CDE 17.60   COLONOSCOPY     COLONOSCOPY N/A 07/08/2015   Procedure: COLONOSCOPY;  Surgeon: Rogene Houston, MD;  [facility];  Service: Endoscopy;  Laterality: N/A;  930   ESOPHAGEAL DILATION N/A 07/08/2015  Procedure: ESOPHAGEAL DILATION;  Surgeon: Rogene Houston, MD;  [facility];  Service: Endoscopy;  Laterality: N/A;   ESOPHAGOGASTRODUODENOSCOPY N/A 07/08/2015   Procedure: ESOPHAGOGASTRODUODENOSCOPY (EGD);  Surgeon: Rogene Houston, MD;  [facility];  Service: Endoscopy;  Laterality: N/A;   Hernia     Right inguinal   HERNIA REPAIR  1961   right inguinal hernia   UPPER GASTROINTESTINAL ENDOSCOPY       Current Outpatient Medications  Medication Sig Dispense Refill   acetaminophen (TYLENOL) 650 MG CR tablet Take 650 mg by mouth every 8 (eight) hours as needed. Patient states that she takes prn for arthritis in her knees.     Bromelains 500 MG TABS Take 500 mg by mouth daily.     Coenzyme Q10 (CO  Q-10 PO) Take 1 tablet by mouth daily. With red yeast rice [employer]     COLLAGEN PO Take 1 tablet by mouth daily. This is known as 1-2-3     Cyanocobalamin (VITAMIN B 12 PO) Take 1,000 mcg by mouth daily.     Ginkgo Biloba 40 MG TABS Take 40 mg by mouth daily.     glucosamine-chondroitin 500-400 MG tablet Take 1 tablet by mouth 2 (two) times daily.     Hyaluronic Acid-Vitamin C (HYALURONIC ACID PO) Take 80 mg by mouth daily.     Mesalamine (ASACOL) 400 MG CPDR DR capsule TAKE 2 CAPSULES DAILY 180 capsule 3   metoprolol tartrate (LOPRESSOR) 25 MG tablet TAKE 1 TABLET TWICE A DAY 180 tablet 3   MULTIPLE VITAMINS PO Take 1 tablet by mouth daily. In the mornings     Nutritional Supplements (GRAPESEED EXTRACT PO) Take 60 mg by mouth daily.      OAT BRAN SOLUBLE PO Take 1 tablet by mouth daily.     OVER THE COUNTER MEDICATION Take 66 mg by mouth daily. Vision [employer] Lutein     OVER THE COUNTER MEDICATION Take 250 mg by mouth daily. Alphalipoic Acid 250 mg daily     pantoprazole (PROTONIX) 20 MG tablet Take 1 tablet (20 mg total) by mouth daily before breakfast. 90 tablet 3   pyridOXINE (VITAMIN B-6) 100 MG tablet Take 100 mg by mouth daily.     Turmeric 500 MG CAPS Take 1 capsule by mouth 3 (three) times daily.      Vitamins C E (CRANBERRY CONCENTRATE PO) Take 2,500 mg by mouth daily.     Zinc 50 MG CAPS Take 50 mg by mouth daily.     No current facility-administered medications for this visit.    Allergies:   Penicillins, Influenza virus vacc split pf, and Tape    ROS:  Please see the history of present illness.   Otherwise, review of systems are positive for none.   All other systems are reviewed and negative.    PHYSICAL EXAM: VS:  BP 138/80   Pulse 60   Ht 5' (1.524 m)   Wt 127 lb 6.4 oz (57.8 kg)   SpO2 99%   BMI 24.88 kg/m  , BMI Body mass index is 24.88 kg/m. GENERAL:  Well appearing NECK:  No jugular venous distention, waveform within normal limits, carotid upstroke  brisk and symmetric, no bruits, no thyromegaly LUNGS:  Clear to auscultation bilaterally CHEST:  Unremarkable HEART:  PMI not displaced or sustained,S1 and S2 within normal limits, no S3, no S4, no clicks, no rubs, no murmurs ABD:  Flat, positive bowel sounds normal in frequency in  pitch, no bruits, no rebound, no guarding, no midline pulsatile mass, no hepatomegaly, no splenomegaly EXT:  2 plus pulses throughout, no edema, no cyanosis no clubbing   EKG:  EKG is not ordered today. 10/05/2021 normal sinus rhythm, rate 69, axis within normal limits, intervals within normal limits, no acute ST-T wave changes.   Recent Labs: 12/17/2020: Magnesium 2.0 05/17/2021: ALT 19; TSH 1.710 10/05/2021: BUN 13; Creatinine, Ser 0.83; Hemoglobin 14.5; Platelets 309; Potassium 4.2; Sodium 139    Lipid Panel    Component Value Date/Time   CHOL 187 05/17/2021 0959   TRIG 89 05/17/2021 0959   TRIG 79 12/18/2015 1105   HDL 55 05/17/2021 0959   HDL 65 12/18/2015 1105   CHOLHDL 3.4 05/17/2021 0959   LDLCALC 116 (H) 05/17/2021 0959   LDLCALC 109 (H) 11/29/2013 0822      Wt Readings from Last 3 Encounters:  10/12/21 127 lb 6.4 oz (57.8 kg)  10/05/21 128 lb (58.1 kg)  06/29/21 129 lb 1.6 oz (58.6 kg)      Other studies Reviewed: Additional studies/ records that were reviewed today include: ED records. Review of the above records demonstrates:  Please see elsewhere in the note.     ASSESSMENT AND PLAN:   SVT:   She is still not ready for ablation.  We talked about as needed dosing of her beta-blocker again.  She would rather proceed with this.  I reviewed her blood pressure diary and heart rate and I think her resting heart rate is probably too low to titrate her meds further.  If she has further breakthroughs however I might switch her from beta-blocker to calcium channel blocker.  Of note she is up-to-date with blood work including thyroid.  HTN:   Her blood pressure is mildly elevated today but  usually well controlled.  No change in therapy.   Current medicines are reviewed at length with the patient today.  The patient does not have concerns regarding medicines.  The following changes have been made:   None  Labs/ tests ordered today include: None No orders of the defined types were placed in this encounter.     Disposition:   FU with me 6 months in [place].     Signed, Lori Breeding, MD  10/12/2021 2:55 PM    [place]

## 2021-10-12 ENCOUNTER — Encounter: Payer: Self-pay | Admitting: Cardiology

## 2021-10-12 ENCOUNTER — Ambulatory Visit (INDEPENDENT_AMBULATORY_CARE_PROVIDER_SITE_OTHER): Payer: Medicare Other | Admitting: Cardiology

## 2021-10-12 VITALS — BP 138/80 | HR 60 | Ht 60.0 in | Wt 127.4 lb

## 2021-10-12 DIAGNOSIS — I471 Supraventricular tachycardia: Secondary | ICD-10-CM | POA: Diagnosis not present

## 2021-10-12 NOTE — Patient Instructions (Signed)
Medication Instructions:  Your physician recommends that you continue on your current medications as directed. Please refer to the Current Medication list given to you today.  *If you need a refill on your cardiac medications before your next appointment, please call your pharmacy*  Follow-Up: At [facility], you and your health needs are our priority.  As part of our continuing mission to provide you with exceptional heart care, we have created designated Provider Care Teams.  These Care Teams include your primary Cardiologist (physician) and Advanced Practice Providers (APPs -  Physician Assistants and Nurse Practitioners) who all work together to provide you with the care you need, when you need it.  We recommend signing up for the patient portal called "MyChart".  Sign up information is provided on this After Visit Summary.  MyChart is used to connect with patients for Virtual Visits (Telemedicine).  Patients are able to view lab/test results, encounter notes, upcoming appointments, etc.  Non-urgent messages can be sent to your provider as well.   To learn more about what you can do with MyChart, go to [email or web address].    Your next appointment:   6 month(s)  The format for your next appointment:   In Person  Provider:   Minus Breeding, MD {    Important Information About Sugar

## 2021-10-26 DIAGNOSIS — Z1231 Encounter for screening mammogram for malignant neoplasm of breast: Secondary | ICD-10-CM | POA: Diagnosis not present

## 2021-10-29 DIAGNOSIS — R928 Other abnormal and inconclusive findings on diagnostic imaging of breast: Secondary | ICD-10-CM

## 2021-10-29 DIAGNOSIS — N6489 Other specified disorders of breast: Secondary | ICD-10-CM

## 2021-11-04 DIAGNOSIS — R922 Inconclusive mammogram: Secondary | ICD-10-CM | POA: Diagnosis not present

## 2021-11-04 DIAGNOSIS — R928 Other abnormal and inconclusive findings on diagnostic imaging of breast: Secondary | ICD-10-CM | POA: Diagnosis not present

## 2021-11-08 ENCOUNTER — Ambulatory Visit (INDEPENDENT_AMBULATORY_CARE_PROVIDER_SITE_OTHER): Payer: Medicare Other | Admitting: Family Medicine

## 2021-11-08 ENCOUNTER — Encounter: Payer: Self-pay | Admitting: Family Medicine

## 2021-11-08 VITALS — BP 128/78 | HR 71 | Temp 97.6°F | Ht 60.0 in | Wt 123.6 lb

## 2021-11-08 DIAGNOSIS — Z8679 Personal history of other diseases of the circulatory system: Secondary | ICD-10-CM

## 2021-11-08 DIAGNOSIS — I10 Essential (primary) hypertension: Secondary | ICD-10-CM

## 2021-11-08 NOTE — Progress Notes (Signed)
Subjective: CC: 60-monthchronic follow-up PCP: [name] DO HONG:EXBMWUSMadilynn Soto a 86y.o. female presenting to clinic today for:  1.  SVT/ HTN Patient reports that she had an episode of SVT that required adenosine in the ER recently.  Dr. HPercival Spanishis closely monitoring the patient.  She is trying to avoid ablation at all costs.  She is currently taking 1 tablet of Lopressor each morning and half a tablet each evening with 1/2 to 1 tablet as needed tachycardia and/or palpitations.  Her blood pressure has been running pretty good with this morning's reading 132/77.  She does not report any chest pain, shortness of breath or change in exercise tolerance.   ROS: Per HPI  Allergies  Allergen Reactions   Penicillins Itching and Swelling    Patient states that she had a rash also Has patient had a PCN reaction causing immediate rash, facial/tongue/throat swelling, SOB or lightheadedness with hypotension: No Has patient had a PCN reaction causing severe rash involving mucus membranes or skin necrosis: No Has patient had a PCN reaction that required hospitalization No Has patient had a PCN reaction occurring within the last 10 years: No If all of the above answers are "NO", then may proceed with Cephalosporin use.    Influenza Virus Vacc Split Pf Rash    Per Patient she was told by her PCP not to ever take this injection again   Tape Itching   Past Medical History:  Diagnosis Date   Anemia    Arthritis    [place]cer [place]    skin cancer on nose   Carpal tunnel syndrome, bilateral    Cataract    Esophagitis    GERD (gastroesophageal reflux disease)    H/O hiatal hernia    Osteopenia    Scoliosis    Shingles    SVT (supraventricular tachycardia) (HCC)    Ulcerative colitis     Current Outpatient Medications:    acetaminophen (TYLENOL) 650 MG CR tablet, Take 650 mg by mouth every 8 (eight) hours as needed. Patient states that she takes prn for arthritis in her knees.,  Disp: , Rfl:    Bromelains 500 MG TABS, Take 500 mg by mouth daily., Disp: , Rfl:    Coenzyme Q10 (CO Q-10 PO), Take 1 tablet by mouth daily. With red yeast rice [employer], Disp: , Rfl:    COLLAGEN PO, Take 1 tablet by mouth daily. This is known as 1-2-3, Disp: , Rfl:    Cyanocobalamin (VITAMIN B 12 PO), Take 1,000 mcg by mouth daily., Disp: , Rfl:    Ginkgo Biloba 40 MG TABS, Take 40 mg by mouth daily., Disp: , Rfl:    glucosamine-chondroitin 500-400 MG tablet, Take 1 tablet by mouth 2 (two) times daily., Disp: , Rfl:    Hyaluronic Acid-Vitamin C (HYALURONIC ACID PO), Take 80 mg by mouth daily., Disp: , Rfl:    Mesalamine (ASACOL) 400 MG CPDR DR capsule, TAKE 2 CAPSULES DAILY, Disp: 180 capsule, Rfl: 3   metoprolol tartrate (LOPRESSOR) 25 MG tablet, TAKE 1 TABLET TWICE A DAY, Disp: 180 tablet, Rfl: 3   MULTIPLE VITAMINS PO, Take 1 tablet by mouth daily. In the mornings, Disp: , Rfl:    Nutritional Supplements (GRAPESEED EXTRACT PO), Take 60 mg by mouth daily. , Disp: , Rfl:    OAT BRAN SOLUBLE PO, Take 1 tablet by mouth daily., Disp: , Rfl:    OVER THE COUNTER MEDICATION, Take 66 mg by mouth daily.  Vision Gold Lutein, Disp: , Rfl:    OVER THE COUNTER MEDICATION, Take 250 mg by mouth daily. Alphalipoic Acid 250 mg daily, Disp: , Rfl:    pantoprazole (PROTONIX) 20 MG tablet, Take 1 tablet (20 mg total) by mouth daily before breakfast., Disp: 90 tablet, Rfl: 3   pyridOXINE (VITAMIN B-6) 100 MG tablet, Take 100 mg by mouth daily., Disp: , Rfl:    Turmeric 500 MG CAPS, Take 1 capsule by mouth 3 (three) times daily. , Disp: , Rfl:    Vitamins C E (CRANBERRY CONCENTRATE PO), Take 2,500 mg by mouth daily., Disp: , Rfl:    Zinc 50 MG CAPS, Take 50 mg by mouth daily., Disp: , Rfl:  Social History   Socioeconomic History   Marital status: Widowed    Spouse name: Not on file   Number of children: 3   Years of education: Not on file   Highest education level: Bachelor's degree (e.g., BA,  AB, BS)  Occupational History    Employer: RETIRED    Comment: accounting  Tobacco Use   Smoking status: Never   Smokeless tobacco: Never   Tobacco comments:    Never smoker  Vaping Use   Vaping Use: Never used  Substance and Sexual Activity   Alcohol use: Yes    Alcohol/week: 0.0 standard drinks of alcohol    Comment: Very Rarely   Drug use: No   Sexual activity: Not Currently    Birth control/protection: Surgical  Other Topics Concern   Not on file  Social History Narrative   Lives alone in 2 story home. Still very independent   Social Determinants of Health   Financial Resource Strain: Low Risk  (06/16/2021)   Overall Financial Resource Strain (CARDIA)    Difficulty of Paying Living Expenses: Not hard at all  Food Insecurity: No Food Insecurity (06/16/2021)   Hunger Vital Sign    Worried About Running Out of Food in the Last Year: Never true    Ran Out of Food in the Last Year: Never true  Transportation Needs: No Transportation Needs (06/16/2021)   PRAPARE - Hydrologist (Medical): No    Lack of Transportation (Non-Medical): No  Physical Activity: Sufficiently Active (06/16/2021)   Exercise Vital Sign    Days of Exercise per Week: 3 days    Minutes of Exercise per Session: 60 min  Stress: No Stress Concern Present (06/16/2021)   [place]    Feeling of Stress : Only a little  Social Connections: Moderately Integrated (06/16/2021)   Social Connection and Isolation Panel [NHANES]    Frequency of Communication with Friends and Family: More than three times a week    Frequency of Social Gatherings with Friends and Family: More than three times a week    Attends Religious Services: More than 4 times per year    Active Member of [employer] or Organizations: Yes    Attends Archivist Meetings: More than 4 times per year    Marital Status: Widowed  Intimate Partner Violence:  Not At Risk (06/16/2021)   Humiliation, Afraid, Rape, and Kick questionnaire    Fear of Current or Ex-Partner: No    Emotionally Abused: No    Physically Abused: No    Sexually Abused: No   Family History  Problem Relation Age of Onset   Arthritis Mother    Heart disease Mother    Cancer Father  pancreat   Pancreatic cancer Father    Arthritis Father        back issues    Cancer Brother        lung and liver   Liver cancer Brother    Lung cancer Brother    Heart disease Sister        Rheumatic Fever   Peptic Ulcer Sister    Diabetes Sister    Colon cancer Sister    Edema Sister    GI problems Sister        diverticulitis   Diabetes Sister    Neuropathy Sister    COPD Sister    Hiatal hernia Sister    GI problems Sister     Objective: Office vital signs reviewed. BP 128/78   Pulse 71   Temp 97.6 F (36.4 C)   Ht 5' (1.524 m)   Wt 123 lb 9.6 oz (56.1 kg)   SpO2 99%   BMI 24.14 kg/m   Physical Examination:  General: Awake, alert, well nourished, No acute distress HEENT: Sclera white.  Moist mucous membranes.  TMs intact bilaterally.  PERRLA, EOMI. Cardio: regular rate and rhythm, S1S2 heard, no murmurs appreciated Pulm: clear to auscultation bilaterally, no wheezes, rhonchi or rales; normal work of breathing on room air GI: soft, non-tender, non-distended, bowel sounds present x4, no hepatomegaly, no splenomegaly, no masses  Assessment/ Plan: 86 y.o. female   Essential hypertension  History of supraventricular tachycardia  Blood pressure under excellent control.  No changes needed.  We reviewed her recent BMP which showed normal creatinine and GFR.  Copies of these were provided to the patient today  I reviewed her most recent note by Dr. Percival Spanish.  Continue beta-blocker   No orders of the defined types were placed in this encounter.  No orders of the defined types were placed in this encounter.    [name], DO [place] [phone]

## 2021-12-27 ENCOUNTER — Encounter (INDEPENDENT_AMBULATORY_CARE_PROVIDER_SITE_OTHER): Payer: Self-pay | Admitting: Gastroenterology

## 2021-12-27 ENCOUNTER — Ambulatory Visit (INDEPENDENT_AMBULATORY_CARE_PROVIDER_SITE_OTHER): Payer: Medicare Other | Admitting: Gastroenterology

## 2021-12-27 VITALS — BP 146/85 | HR 67 | Temp 98.0°F | Ht 60.0 in | Wt 125.8 lb

## 2021-12-27 DIAGNOSIS — K5100 Ulcerative (chronic) pancolitis without complications: Secondary | ICD-10-CM

## 2021-12-27 DIAGNOSIS — K2100 Gastro-esophageal reflux disease with esophagitis, without bleeding: Secondary | ICD-10-CM | POA: Diagnosis not present

## 2021-12-27 DIAGNOSIS — K222 Esophageal obstruction: Secondary | ICD-10-CM

## 2021-12-27 MED ORDER — MESALAMINE 400 MG PO CPDR: 800.0000 mg | DELAYED_RELEASE_CAPSULE | Freq: Every day | ORAL | 3 refills | Status: DC

## 2021-12-27 NOTE — Progress Notes (Unsigned)
Maylon Peppers, M.D. Gastroenterology & Hepatology [facility] [address] [place], [place] [address]  Primary Care Physician: [name], [place] [address]  I will communicate my assessment and recommendations to the referring MD via EMR.  Problems: Ulcerative pancolitis  History of Present Illness: Lori Soto is a 86 y.o. female with past medical history of ulcerative pancolitis, SVT, GERD, osteoporosis, who presents for follow up of ulcerative colitis and GERD.  The patient was last seen on 06/29/2021. At that time, the patient was advised to decrease pantoprazole to 20 mg every morning.  She was continued on mesalamine (Asacol) 800 mg daily.  Patient reports she feels well overall. States she has 1 BM every day,  but if she eats corn or fresh fruits she may have 2 loose Bms that day. No watery Bms or hematochezia/melena.  The patient denies having any nausea, vomiting, fever, chills,hematemesis, abdominal distention, abdominal pain, jaundice, pruritus or weight loss.  She is currently on pantoprazole 20 mg every other day, but she reports having symptoms if she skips it for longer - she will having heartburn. States with bigger pills she has some dysphagia, but no problems with dysphagia to food intake.  Last available labs in 10/05/2021 Normal BMP Cr 0.83, CBC was also normal.  Last EGD: 2017  - LA Grade A esophagitis. - Medium-sized hiatal hernia. - Benign-appearing esophageal stenosis. Dilated with balloon up to 18 mm. - Normal stomach. - Normal examined duodenum. - Three biopsies were obtained at the gastroesophageal junction.  Last Colonoscopy: 2017 - One 5 mm polyp in the cecum, removed using lift and cut and a cold snare. Resected and retrieved. - One 7 mm polyp in the sigmoid colon, removed with a hot snare. Resected and retrieved. Clip (MR conditional) was placed. - Diverticulosis  in the sigmoid colon.  Path 1. Esophagogastric junction, biopsy - ESOPHAGOGASTRIC JUNCTION MUCOSA - NEGATIVE FOR INTESTINAL METAPLASIA - NEGATIVE FOR DYSPLASIA OR MALIGNANCY 2. Colon, polyp(s), cecal - TUBULAR ADENOMA(X1). - HIGH GRADE DYSPLASIA IS NOT IDENTIFIED. 3. Colon, polyp(s), sigmoid - ULCERATED INFLAMMED INTRAMUCOSAL LEIOMYOMA - NEGATIVE FOR DYSPLASIA OR MALIGNANCY  Last flu shot: is intolerant as she is allergic Last pneumonia shot:states she received 2 shot series in the past Last zoster vaccine: did not have it as she states she had shingles in the past. COVID-19 shot: advised to avoid it due to influenza allergy  Past Medical History: Past Medical History:  Diagnosis Date   Anemia    Arthritis    Cancer ([place])    skin cancer on nose   Carpal tunnel syndrome, bilateral    Cataract    Esophagitis    GERD (gastroesophageal reflux disease)    H/O hiatal hernia    Osteopenia    Scoliosis    Shingles    SVT (supraventricular tachycardia) ([place])    Ulcerative colitis     Past Surgical History: Past Surgical History:  Procedure Laterality Date   ABDOMINAL HYSTERECTOMY     BUNIONECTOMY     Bilateral   CARPAL TUNNEL RELEASE Left 03/10/2015   Procedure: LEFT CARPAL TUNNEL RELEASE;  Surgeon: Daryll Brod, MD;  Location: [place];  Service: Orthopedics;  Laterality: Left;   CARPAL TUNNEL RELEASE Right 10/31/2017   Procedure: RIGHT CARPAL TUNNEL RELEASE;  Surgeon: Daryll Brod, MD;  Location: [place];  Service: Orthopedics;  Laterality: Right;   CATARACT EXTRACTION W/PHACO  05/28/2012  Procedure: CATARACT EXTRACTION PHACO AND INTRAOCULAR LENS PLACEMENT (IOC);  Surgeon: Tonny Branch, MD;  Location: [facility];  Service: Ophthalmology;  Laterality: Right;  CDE=12.84   CATARACT EXTRACTION W/PHACO  06/07/2012   Procedure: CATARACT EXTRACTION PHACO AND INTRAOCULAR LENS PLACEMENT (IOC);  Surgeon: Tonny Branch, MD;  Location: [facility];  Service:  Ophthalmology;  Laterality: Left;  CDE 17.60   COLONOSCOPY     COLONOSCOPY N/A 07/08/2015   Procedure: COLONOSCOPY;  Surgeon: Rogene Houston, MD;  [facility];  Service: Endoscopy;  Laterality: N/A;  930   ESOPHAGEAL DILATION N/A 07/08/2015   Procedure: ESOPHAGEAL DILATION;  Surgeon: Rogene Houston, MD;  [facility];  Service: Endoscopy;  Laterality: N/A;   ESOPHAGOGASTRODUODENOSCOPY N/A 07/08/2015   Procedure: ESOPHAGOGASTRODUODENOSCOPY (EGD);  Surgeon: Rogene Houston, MD;  [facility];  Service: Endoscopy;  Laterality: N/A;   Hernia     Right inguinal   HERNIA REPAIR  1961   right inguinal hernia   UPPER GASTROINTESTINAL ENDOSCOPY      Family History: Family History  Problem Relation Age of Onset   Arthritis Mother    Heart disease Mother    Cancer Father        pancreat   Pancreatic cancer Father    Arthritis Father        back issues    Cancer Brother        lung and liver   Liver cancer Brother    Lung cancer Brother    Heart disease Sister        Rheumatic Fever   Peptic Ulcer Sister    Diabetes Sister    Colon cancer Sister    Edema Sister    GI problems Sister        diverticulitis   Diabetes Sister    Neuropathy Sister    COPD Sister    Hiatal hernia Sister    GI problems Sister     Social History: Social History   Tobacco Use  Smoking Status Never  Smokeless Tobacco Never  Tobacco Comments   Never smoker   Social History   Substance and Sexual Activity  Alcohol Use Yes   Alcohol/week: 0.0 standard drinks of alcohol   Comment: Very Rarely   Social History   Substance and Sexual Activity  Drug Use No    Allergies: Allergies  Allergen Reactions   Penicillins Itching and Swelling    Patient states that she had a rash also Has patient had a PCN reaction causing immediate rash, facial/tongue/throat swelling, SOB or lightheadedness with hypotension: No Has patient had a PCN reaction causing severe rash  involving mucus membranes or skin necrosis: No Has patient had a PCN reaction that required hospitalization No Has patient had a PCN reaction occurring within the last 10 years: No If all of the above answers are "NO", then may proceed with Cephalosporin use.    Influenza Virus Vacc Split Pf Rash    Per Patient she was told by her PCP not to ever take this injection again   Tape Itching    Medications: Current Outpatient Medications  Medication Sig Dispense Refill   acetaminophen (TYLENOL) 650 MG CR tablet Take 650 mg by mouth every 8 (eight) hours as needed. Patient states that she takes prn for arthritis in her knees.     Bromelains 500 MG TABS Take 500 mg by mouth daily.     Coenzyme Q10 (CO Q-10 PO) Take 1 tablet by mouth  daily. With red yeast rice [employer]     COLLAGEN PO Take 1 tablet by mouth daily. This is known as 1-2-3     Cyanocobalamin (VITAMIN B 12 PO) Take 1,000 mcg by mouth daily.     Ginkgo Biloba 40 MG TABS Take 40 mg by mouth daily.     glucosamine-chondroitin 500-400 MG tablet Take 1 tablet by mouth 2 (two) times daily.     Hyaluronic Acid-Vitamin C (HYALURONIC ACID PO) Take 80 mg by mouth daily.     metoprolol tartrate (LOPRESSOR) 25 MG tablet TAKE 1 TABLET TWICE A DAY 180 tablet 3   MULTIPLE VITAMINS PO Take 1 tablet by mouth daily. In the mornings     Nutritional Supplements (GRAPESEED EXTRACT PO) Take 60 mg by mouth daily.      OAT BRAN SOLUBLE PO Take 1 tablet by mouth daily.     OVER THE COUNTER MEDICATION Take 66 mg by mouth daily. Vision Gold Lutein     OVER THE COUNTER MEDICATION Take 250 mg by mouth daily. Alphalipoic Acid 250 mg daily     pantoprazole (PROTONIX) 20 MG tablet Take 1 tablet (20 mg total) by mouth daily before breakfast. 90 tablet 3   pyridOXINE (VITAMIN B-6) 100 MG tablet Take 100 mg by mouth daily.     Turmeric 500 MG CAPS Take 1 capsule by mouth 3 (three) times daily.      Vitamins C E (CRANBERRY CONCENTRATE PO) Take 2,500 mg by  mouth daily.     Zinc 50 MG CAPS Take 50 mg by mouth daily.     Mesalamine (ASACOL) 400 MG CPDR DR capsule Take 2 capsules (800 mg total) by mouth daily. 180 capsule 3   No current facility-administered medications for this visit.    Review of Systems: GENERAL: negative for malaise, night sweats HEENT: No changes in hearing or vision, no nose bleeds or other nasal problems. NECK: Negative for lumps, goiter, pain and significant neck swelling RESPIRATORY: Negative for cough, wheezing CARDIOVASCULAR: Negative for chest pain, leg swelling, palpitations, orthopnea GI: SEE HPI MUSCULOSKELETAL: Negative for joint pain or swelling, back pain, and muscle pain. SKIN: Negative for lesions, rash PSYCH: Negative for sleep disturbance, mood disorder and recent psychosocial stressors. HEMATOLOGY Negative for prolonged bleeding, bruising easily, and swollen nodes. ENDOCRINE: Negative for cold or heat intolerance, polyuria, polydipsia and goiter. NEURO: negative for tremor, gait imbalance, syncope and seizures. The remainder of the review of systems is noncontributory.   Physical Exam: BP (!) 146/85 (BP Location: Left Arm, Patient Position: Sitting, Cuff Size: Large) Comment: recheck at end visit 118/77  Pulse 67   Temp 98 F (36.7 C) (Oral)   Ht 5' (1.524 m)   Wt 125 lb 12.8 oz (57.1 kg)   BMI 24.57 kg/m  GENERAL: The patient is AO x3, in no acute distress. HEENT: Head is normocephalic and atraumatic. EOMI are intact. Mouth is well hydrated and without lesions. NECK: Supple. No masses LUNGS: Clear to auscultation. No presence of rhonchi/wheezing/rales. Adequate chest expansion HEART: RRR, normal s1 and s2. ABDOMEN: Soft, nontender, no guarding, no peritoneal signs, and nondistended. BS +. No masses. EXTREMITIES: Without any cyanosis, clubbing, rash, lesions or edema. NEUROLOGIC: AOx3, no focal motor deficit. SKIN: no jaundice, no rashes  Imaging/Labs: as above  I personally reviewed and  interpreted the available labs, imaging and endoscopic files.  Impression and Plan: Lori Soto is a 86 y.o. female with past medical history of ulcerative pancolitis, SVT, GERD,  osteoporosis, who presents for follow up of ulcerative colitis and GERD.  The patient has been doing well from the gastrointestinal perspective as she has achieved clinical and endoscopic remission while on mesalamine.  She is up-to-date in terms of her vaccinations.  Disease she will need to continue mesalamine 800 mg every day.  Regarding her GERD, symptoms are controlled as well we will need to continue her current dose of pantoprazole.  - Continue mesalamine 800 mg qday - Continue pantoprazole 20 mg every other day, if breakthrough episodes of heartburn or swallowing can increase the dose to daily dosing  All questions were answered.      Harvel Quale, MD Gastroenterology and Hepatology [facility]

## 2021-12-27 NOTE — Patient Instructions (Signed)
Continue mesalamine 800 mg qday Continue pantoprazole 20 mg every other day, if breakthrough episodes of heartburn or swallowing can increase the dose to daily dosing

## 2022-03-15 ENCOUNTER — Other Ambulatory Visit: Payer: Self-pay | Admitting: Gastroenterology

## 2022-03-15 DIAGNOSIS — K5100 Ulcerative (chronic) pancolitis without complications: Secondary | ICD-10-CM

## 2022-04-20 ENCOUNTER — Ambulatory Visit: Payer: Medicare Other | Admitting: Cardiology

## 2022-05-13 ENCOUNTER — Encounter: Payer: Medicare Other | Admitting: Family Medicine

## 2022-05-16 ENCOUNTER — Encounter: Payer: Medicare Other | Admitting: Family Medicine

## 2022-05-17 ENCOUNTER — Ambulatory Visit (INDEPENDENT_AMBULATORY_CARE_PROVIDER_SITE_OTHER): Payer: Medicare Other | Admitting: Family Medicine

## 2022-05-17 ENCOUNTER — Encounter: Payer: Self-pay | Admitting: Family Medicine

## 2022-05-17 VITALS — BP 133/76 | HR 60 | Temp 98.1°F | Ht 60.0 in | Wt 124.0 lb

## 2022-05-17 DIAGNOSIS — Z862 Personal history of diseases of the blood and blood-forming organs and certain disorders involving the immune mechanism: Secondary | ICD-10-CM

## 2022-05-17 DIAGNOSIS — M25512 Pain in left shoulder: Secondary | ICD-10-CM | POA: Diagnosis not present

## 2022-05-17 DIAGNOSIS — E782 Mixed hyperlipidemia: Secondary | ICD-10-CM | POA: Diagnosis not present

## 2022-05-17 DIAGNOSIS — G8929 Other chronic pain: Secondary | ICD-10-CM

## 2022-05-17 DIAGNOSIS — I10 Essential (primary) hypertension: Secondary | ICD-10-CM

## 2022-05-17 DIAGNOSIS — E559 Vitamin D deficiency, unspecified: Secondary | ICD-10-CM | POA: Diagnosis not present

## 2022-05-17 NOTE — Patient Instructions (Signed)
Preventive Care 65 Years and Older, Female Preventive care refers to lifestyle choices and visits with your health care provider that can promote health and wellness. Preventive care visits are also called wellness exams. What can I expect for my preventive care visit? Counseling Your health care provider may ask you questions about your: Medical history, including: Past medical problems. Family medical history. Pregnancy and menstrual history. History of falls. Current health, including: Memory and ability to understand (cognition). Emotional well-being. Home life and relationship well-being. Sexual activity and sexual health. Lifestyle, including: Alcohol, nicotine or tobacco, and drug use. Access to firearms. Diet, exercise, and sleep habits. Work and work environment. Sunscreen use. Safety issues such as seatbelt and bike helmet use. Physical exam Your health care provider will check your: Height and weight. These may be used to calculate your BMI (body mass index). BMI is a measurement that tells if you are at a healthy weight. Waist circumference. This measures the distance around your waistline. This measurement also tells if you are at a healthy weight and may help predict your risk of certain diseases, such as type 2 diabetes and high blood pressure. Heart rate and blood pressure. Body temperature. Skin for abnormal spots. What immunizations do I need?  Vaccines are usually given at various ages, according to a schedule. Your health care provider will recommend vaccines for you based on your age, medical history, and lifestyle or other factors, such as travel or where you work. What tests do I need? Screening Your health care provider may recommend screening tests for certain conditions. This may include: Lipid and cholesterol levels. Hepatitis C test. Hepatitis B test. HIV (human immunodeficiency virus) test. STI (sexually transmitted infection) testing, if you are at  risk. Lung cancer screening. Colorectal cancer screening. Diabetes screening. This is done by checking your blood sugar (glucose) after you have not eaten for a while (fasting). Mammogram. Talk with your health care provider about how often you should have regular mammograms. BRCA-related cancer screening. This may be done if you have a family history of breast, ovarian, tubal, or peritoneal cancers. Bone density scan. This is done to screen for osteoporosis. Talk with your health care provider about your test results, treatment options, and if necessary, the need for more tests. Follow these instructions at home: Eating and drinking  Eat a diet that includes fresh fruits and vegetables, whole grains, lean protein, and low-fat dairy products. Limit your intake of foods with high amounts of sugar, saturated fats, and salt. Take vitamin and mineral supplements as recommended by your health care provider. Do not drink alcohol if your health care provider tells you not to drink. If you drink alcohol: Limit how much you have to 0-1 drink a day. Know how much alcohol is in your drink. In the [place] one drink equals one 12 oz bottle of beer (355 mL), one 5 oz glass of wine (148 mL), or one 1 oz glass of hard liquor (44 mL). Lifestyle Brush your teeth every morning and night with fluoride toothpaste. Floss one time each day. Exercise for at least 30 minutes 5 or more days each week. Do not use any products that contain nicotine or tobacco. These products include cigarettes, chewing tobacco, and vaping devices, such as e-cigarettes. If you need help quitting, ask your health care provider. Do not use drugs. If you are sexually active, practice safe sex. Use a condom or other form of protection in order to prevent STIs. Take aspirin only as told by   your health care provider. Make sure that you understand how much to take and what form to take. Work with your health care provider to find out whether it  is safe and beneficial for you to take aspirin daily. Ask your health care provider if you need to take a cholesterol-lowering medicine (statin). Find healthy ways to manage stress, such as: Meditation, yoga, or listening to music. Journaling. Talking to a trusted person. Spending time with friends and family. Minimize exposure to UV radiation to reduce your risk of skin cancer. Safety Always wear your seat belt while driving or riding in a vehicle. Do not drive: If you have been drinking alcohol. Do not ride with someone who has been drinking. When you are tired or distracted. While texting. If you have been using any mind-altering substances or drugs. Wear a helmet and other protective equipment during sports activities. If you have firearms in your house, make sure you follow all gun safety procedures. What's next? Visit your health care provider once a year for an annual wellness visit. Ask your health care provider how often you should have your eyes and teeth checked. Stay up to date on all vaccines. This information is not intended to replace advice given to you by your health care provider. Make sure you discuss any questions you have with your health care provider. Document Revised: 10/14/2020 Document Reviewed: 10/14/2020 Elsevier Patient Education  2023 [employer]  

## 2022-05-17 NOTE — Progress Notes (Signed)
Subjective: CC: Left shoulder pain PCP: [name], DO UUE:[facility] Lori Soto is a 87 y.o. female presenting to clinic today for:  1.  Left shoulder pain Patient reports that she has been suffering with some left-sided shoulder pain.  It is not excruciating but she notes discomfort, particularly when she is raising that left upper extremity up.  She denies any preceding injury.  She is right-hand dominant.  She does not report any sensory changes in that left upper extremity.  Has not utilize any creams on this area but does have some that she uses on her lower extremities  2.  Hypertension associated with hyperlipidemia Patient is compliant with Lopressor.  Has follow-up visit with her cardiologist sometime in March.  No chest pain, shortness of breath or edema reported.  Tries to stay physically active and eat a balanced diet  ROS: Per HPI  Allergies  Allergen Reactions   Penicillins Itching and Swelling    Patient states that she had a rash also Has patient had a PCN reaction causing immediate rash, facial/tongue/throat swelling, SOB or lightheadedness with hypotension: No Has patient had a PCN reaction causing severe rash involving mucus membranes or skin necrosis: No Has patient had a PCN reaction that required hospitalization No Has patient had a PCN reaction occurring within the last 10 years: No If all of the above answers are "NO", then may proceed with Cephalosporin use.    Influenza Virus Vacc Split Pf Rash    Per Patient she was told by her PCP not to ever take this injection again   Tape Itching   Past Medical History:  Diagnosis Date   Anemia    Arthritis    Cancer ([place])    skin cancer on nose   Carpal tunnel syndrome, bilateral    Cataract    Esophagitis    GERD (gastroesophageal reflux disease)    H/O hiatal hernia    Osteopenia    Scoliosis    Shingles    SVT (supraventricular tachycardia)    Ulcerative colitis     Current Outpatient  Medications:    acetaminophen (TYLENOL) 650 MG CR tablet, Take 650 mg by mouth every 8 (eight) hours as needed. Patient states that she takes prn for arthritis in her knees., Disp: , Rfl:    Bromelains 500 MG TABS, Take 500 mg by mouth daily., Disp: , Rfl:    Coenzyme Q10 (CO Q-10 PO), Take 1 tablet by mouth daily. With red yeast rice [employer], Disp: , Rfl:    COLLAGEN PO, Take 1 tablet by mouth daily. This is known as 1-2-3, Disp: , Rfl:    Cyanocobalamin (VITAMIN B 12 PO), Take 1,000 mcg by mouth daily., Disp: , Rfl:    Ginkgo Biloba 40 MG TABS, Take 40 mg by mouth daily., Disp: , Rfl:    glucosamine-chondroitin 500-400 MG tablet, Take 1 tablet by mouth 2 (two) times daily., Disp: , Rfl:    Hyaluronic Acid-Vitamin C (HYALURONIC ACID PO), Take 80 mg by mouth daily., Disp: , Rfl:    Mesalamine (ASACOL) 400 MG CPDR DR capsule, TAKE 2 CAPSULES DAILY, Disp: 180 capsule, Rfl: 3   metoprolol tartrate (LOPRESSOR) 25 MG tablet, TAKE 1 TABLET TWICE A DAY, Disp: 180 tablet, Rfl: 3   MULTIPLE VITAMINS PO, Take 1 tablet by mouth daily. In the mornings, Disp: , Rfl:    Nutritional Supplements (GRAPESEED EXTRACT PO), Take 60 mg by mouth daily. , Disp: , Rfl:  OAT BRAN SOLUBLE PO, Take 1 tablet by mouth daily., Disp: , Rfl:    OVER THE COUNTER MEDICATION, Take 66 mg by mouth daily. [employer] Lutein, Disp: , Rfl:    OVER THE COUNTER MEDICATION, Take 250 mg by mouth daily. Alphalipoic Acid 250 mg daily, Disp: , Rfl:    pantoprazole (PROTONIX) 20 MG tablet, Take 1 tablet (20 mg total) by mouth daily before breakfast., Disp: 90 tablet, Rfl: 3   pyridOXINE (VITAMIN B-6) 100 MG tablet, Take 100 mg by mouth daily., Disp: , Rfl:    Turmeric 500 MG CAPS, Take 1 capsule by mouth 3 (three) times daily. , Disp: , Rfl:    Vitamins C E (CRANBERRY CONCENTRATE PO), Take 2,500 mg by mouth daily., Disp: , Rfl:    Zinc 50 MG CAPS, Take 50 mg by mouth daily., Disp: , Rfl:  Social History   Socioeconomic History    Marital status: Widowed    Spouse name: Not on file   Number of children: 3   Years of education: Not on file   Highest education level: Bachelor's degree (e.g., BA, AB, BS)  Occupational History    Employer: RETIRED    Comment: accounting  Tobacco Use   Smoking status: Never   Smokeless tobacco: Never   Tobacco comments:    Never smoker  Vaping Use   Vaping Use: Never used  Substance and Sexual Activity   Alcohol use: Yes    Alcohol/week: 0.0 standard drinks of alcohol    Comment: Very Rarely   Drug use: No   Sexual activity: Not Currently    Birth control/protection: Surgical  Other Topics Concern   Not on file  Social History Narrative   Lives alone in 2 story home. Still very independent   Social Determinants of Health   Financial Resource Strain: Low Risk  (06/16/2021)   Overall Financial Resource Strain (CARDIA)    Difficulty of Paying Living Expenses: Not hard at all  Food Insecurity: No Food Insecurity (06/16/2021)   Hunger Vital Sign    Worried About Running Out of Food in the Last Year: Never true    Ran Out of Food in the Last Year: Never true  Transportation Needs: No Transportation Needs (06/16/2021)   PRAPARE - Hydrologist (Medical): No    Lack of Transportation (Non-Medical): No  Physical Activity: Sufficiently Active (06/16/2021)   Exercise Vital Sign    Days of Exercise per Week: 3 days    Minutes of Exercise per Session: 60 min  Stress: No Stress Concern Present (06/16/2021)   [place]    Feeling of Stress : Only a little  Social Connections: Moderately Integrated (06/16/2021)   Social Connection and Isolation Panel [NHANES]    Frequency of Communication with Friends and Family: More than three times a week    Frequency of Social Gatherings with Friends and Family: More than three times a week    Attends Religious Services: More than 4 times per year     Active Member of [employer] or Organizations: Yes    Attends Archivist Meetings: More than 4 times per year    Marital Status: Widowed  Intimate Partner Violence: Not At Risk (06/16/2021)   Humiliation, Afraid, Rape, and Kick questionnaire    Fear of Current or Ex-Partner: No    Emotionally Abused: No    Physically Abused: No    Sexually Abused: No  Family History  Problem Relation Age of Onset   Arthritis Mother    Heart disease Mother    Cancer Father        pancreat   Pancreatic cancer Father    Arthritis Father        back issues    Cancer Brother        lung and liver   Liver cancer Brother    Lung cancer Brother    Heart disease Sister        Rheumatic Fever   Peptic Ulcer Sister    Diabetes Sister    Colon cancer Sister    Edema Sister    GI problems Sister        diverticulitis   Diabetes Sister    Neuropathy Sister    COPD Sister    Hiatal hernia Sister    GI problems Sister     Objective: Office vital signs reviewed. BP 133/76   Pulse 60   Temp 98.1 F (36.7 C)   Ht 5' (1.524 m)   Wt 124 lb (56.2 kg)   SpO2 98%   BMI 24.22 kg/m   Physical Examination:  General: Awake, alert, well nourished, No acute distress HEENT: Normal    Neck: No masses palpated. No lymphadenopathy    Ears: Tympanic membranes intact, normal light reflex, no erythema, no bulging    Eyes: PERRLA, extraocular membranes intact, sclera white    Nose: nasal turbinates moist, no nasal discharge    Throat: moist mucus membranes, no erythema, no tonsillar exudate.  Airway is patent Cardio: regular rate and rhythm, S1S2 heard, no murmurs appreciated Pulm: clear to auscultation bilaterally, no wheezes, rhonchi or rales; normal work of breathing on room air GI: soft, non-tender, non-distended, bowel sounds present x4, no hepatomegaly, no splenomegaly, no masses GU: No palpable masses externally Extremities: warm, well perfused, No edema, cyanosis or clubbing; +2 pulses  bilaterally MSK: normal gait and station  Left shoulder: She has a painful arc sign.  Cannot raise left upper extremity in flexion, abduction beyond 90 degrees without discomfort.  She has some limited internal rotation of that left shoulder.  No palpable deformities. Skin: dry; intact; no rashes or lesions Neuro: Patellar DTRs intact bilaterally, 2 out of 4.  No focal neurologic deficits Psych: Tearful when talking about her deceased husband Lori Soto.     06/12/2022    3:45 PM 11/08/2021   10:15 AM 06/16/2021    2:07 PM  Depression screen PHQ 2/9  Decreased Interest 0 0 0  Down, Depressed, Hopeless 0 0 0  PHQ - 2 Score 0 0 0  Altered sleeping   0  Tired, decreased energy   0  Change in appetite   0  Feeling bad or failure about yourself    0  Trouble concentrating   0  Moving slowly or fidgety/restless   0  Suicidal thoughts   0  PHQ-9 Score   0   Assessment/ Plan: 87 y.o. female   Essential hypertension - Plan: CMP14+EGFR, Urinalysis  Mixed hyperlipidemia - Plan: CMP14+EGFR, Lipid Panel, TSH  Vitamin D deficiency - Plan: VITAMIN D 25 Hydroxy (Vit-D Deficiency, Fractures)  History of anemia - Plan: CBC  Chronic left shoulder pain - Plan: Ambulatory referral to Physical Therapy  Blood pressure controlled.  Future orders for labs placed.  She will come in fasting sometime next week  For her left shoulder pain I placed a referral to physical therapy for further evaluation.  We discussed  consideration for eval with orthopedics but she is reluctant to have any corticosteroid injections as this is previously caused hyperactivity  No orders of the defined types were placed in this encounter.  No orders of the defined types were placed in this encounter.  Total time spent with patient 32 minutes.  Greater than 50% of encounter spent in coordination of care/counseling.   [name], DO [place] [phone]

## 2022-05-21 ENCOUNTER — Other Ambulatory Visit (INDEPENDENT_AMBULATORY_CARE_PROVIDER_SITE_OTHER): Payer: Self-pay | Admitting: Internal Medicine

## 2022-05-25 ENCOUNTER — Other Ambulatory Visit: Payer: Self-pay

## 2022-05-25 ENCOUNTER — Ambulatory Visit: Payer: Medicare Other | Attending: Family Medicine | Admitting: Physical Therapy

## 2022-05-25 ENCOUNTER — Encounter: Payer: Self-pay | Admitting: Physical Therapy

## 2022-05-25 DIAGNOSIS — M25512 Pain in left shoulder: Secondary | ICD-10-CM | POA: Insufficient documentation

## 2022-05-25 DIAGNOSIS — G8929 Other chronic pain: Secondary | ICD-10-CM | POA: Insufficient documentation

## 2022-05-25 DIAGNOSIS — M25612 Stiffness of left shoulder, not elsewhere classified: Secondary | ICD-10-CM | POA: Diagnosis not present

## 2022-05-25 DIAGNOSIS — M6281 Muscle weakness (generalized): Secondary | ICD-10-CM | POA: Insufficient documentation

## 2022-05-25 NOTE — Therapy (Signed)
OUTPATIENT PHYSICAL THERAPY SHOULDER EVALUATION   Patient Name: Lori Soto MRN: [id number] DOB:1935-05-18, 87 y.o., female Today's Date: 05/25/2022  END OF SESSION:  PT End of Session - 05/25/22 1145     Visit Number 1    Number of Visits 12    Date for PT Re-Evaluation 08/23/22    Authorization Type FOTO AT LEAST EVERY 5TH VISIT.  PROGRESS NOTE AT 10TH VISIT.  KX MODIFIER AFTER 15 VISITS.    PT Start Time 1124    PT Stop Time 1208    PT Time Calculation (min) 44 min    Activity Tolerance Patient tolerated treatment well    Behavior During Therapy WFL for tasks assessed/performed             Past Medical History:  Diagnosis Date   Anemia    Arthritis    Cancer ([place])    skin cancer on nose   Carpal tunnel syndrome, bilateral    Cataract    Esophagitis    GERD (gastroesophageal reflux disease)    H/O hiatal hernia    Osteopenia    Scoliosis    Shingles    SVT (supraventricular tachycardia)    Ulcerative colitis    Past Surgical History:  Procedure Laterality Date   ABDOMINAL HYSTERECTOMY     BUNIONECTOMY     Bilateral   CARPAL TUNNEL RELEASE Left 03/10/2015   Procedure: LEFT CARPAL TUNNEL RELEASE;  Surgeon: Daryll Brod, MD;  Location: [facility];  Service: Orthopedics;  Laterality: Left;   CARPAL TUNNEL RELEASE Right 10/31/2017   Procedure: RIGHT CARPAL TUNNEL RELEASE;  Surgeon: Daryll Brod, MD;  Location: [facility];  Service: Orthopedics;  Laterality: Right;   CATARACT EXTRACTION W/PHACO  05/28/2012   Procedure: CATARACT EXTRACTION PHACO AND INTRAOCULAR LENS PLACEMENT (IOC);  Surgeon: Tonny Branch, MD;  Location: [facility];  Service: Ophthalmology;  Laterality: Right;  CDE=12.84   CATARACT EXTRACTION W/PHACO  06/07/2012   Procedure: CATARACT EXTRACTION PHACO AND INTRAOCULAR LENS PLACEMENT (IOC);  Surgeon: Tonny Branch, MD;  Location: [facility];  Service: Ophthalmology;  Laterality: Left;  CDE 17.60   COLONOSCOPY     COLONOSCOPY N/A 07/08/2015    Procedure: COLONOSCOPY;  Surgeon: Rogene Houston, MD;  [facility];  Service: Endoscopy;  Laterality: N/A;  930   ESOPHAGEAL DILATION N/A 07/08/2015   Procedure: ESOPHAGEAL DILATION;  Surgeon: Rogene Houston, MD;  [facility];  Service: Endoscopy;  Laterality: N/A;   ESOPHAGOGASTRODUODENOSCOPY N/A 07/08/2015   Procedure: ESOPHAGOGASTRODUODENOSCOPY (EGD);  Surgeon: Rogene Houston, MD;  [facility];  Service: Endoscopy;  Laterality: N/A;   Hernia     Right inguinal   HERNIA REPAIR  1961   right inguinal hernia   UPPER GASTROINTESTINAL ENDOSCOPY     Patient Active Problem List   Diagnosis Date Noted   History of supraventricular tachycardia 11/08/2021   Cough 05/29/2020   Chest congestion 05/29/2020   Arthritis 06/13/2019   GERD (gastroesophageal reflux disease) 06/10/2019   Esophageal stricture 06/10/2019   Educated about COVID-19 virus infection 06/04/2019   SVT (supraventricular tachycardia) 08/28/2018   Elevated troponin 08/28/2018   Light headedness 05/09/2018   Carpal tunnel syndrome of right wrist 05/20/2015   Primary localized osteoarthrosis, hand 05/20/2015   Carpal tunnel syndrome on left 03/18/2015   Osteopenia of the elderly 02/19/2014   DDD (degenerative disc disease), lumbar 12/03/2013   Scoliosis 12/03/2013   Chronic LBP 05/14/2013   IBS (irritable bowel  syndrome) 11/08/2012   UC (ulcerative colitis) ([place]) 04/23/2012   Palpitation 10/26/2011   Hypertension 10/26/2011   Chronic ulcerative colitis ([place]) 03/23/2011    REFERRING PROVIDER: Ronnie Doss DO  REFERRING DIAG: Chronic left shoulder pain.  THERAPY DIAG:  Acute pain of left shoulder - Plan: PT plan of care cert/re-cert  Stiffness of left shoulder, not elsewhere classified - Plan: PT plan of care cert/re-cert  Muscle weakness (generalized) - Plan: PT plan of care cert/re-cert  Rationale for Evaluation and Treatment: Rehabilitation  ONSET DATE: Several  months.  SUBJECTIVE:                                                                                                                                                                                      SUBJECTIVE STATEMENT: The patient presents to the clinic with c/o left shoulder pain over the last several months.  No known injury.  She fist noticed it when she was driving.  Due to pain with lifting her arm she has loss some motion.  Her pain is rated at a 6/10 today but can rise to higher levels with endrange movements.  Heat and massage decreases her pain.    PERTINENT HISTORY: Chronic back pain.  PAIN:  Are you having pain? Yes: NPRS scale: 6/10 Pain location: Left shoulder Pain description: Ache, stiff and throbbing. Aggravating factors: Movement. Relieving factors: As above.  PRECAUTIONS: None  WEIGHT BEARING RESTRICTIONS: No  FALLS:  Has patient fallen in last 6 months? No  LIVING ENVIRONMENT: Lives with: lives alone Lives in: House/apartment Has following equipment at home: None  OCCUPATION: Retired.  PLOF: Independent  PATIENT GOALS:Use left shoulder without pain.  NEXT MD VISIT:   OBJECTIVE:   PATIENT SURVEYS:  FOTO    POSTURE: Forward head and rounded shoulders.  UPPER EXTREMITY ROM:   Left active shoulder flexion is 115 degrees (right is full), ER is 45 degrees and behind back to left SIJ region. UPPER EXTREMITY MMT:  Left shoulder flexion 4-/5 at least in part limited by pain.  ER is 4/5, IR is 4+/5.  Abduction is 4-/5 also at least limited in part due to pain.  SHOULDER SPECIAL TESTS: Positive left shoulder impingement testing.  Negative left Drop Arm test.  PALPATION:  Tender with increased tone over left posterior cuff musculature.   TODAY'S TREATMENT:  DATE: HMP and IFC at 80-150 Hz on 40% scan x 15 minutes  to patient's left shoulder.   ASSESSMENT:  CLINICAL IMPRESSION: The patient presents to OPPT with c/o left shoulder pain that came on several months ago for no apparent reason.  She exhibits a loss of motion and high pain-levels with endrange shoulder movements.  She has some loss of strength limited at least in part due to pain.  She demonstrates a positive impingement test.  Negative Drop Arm test.  She is tender to palpation over her left posterior cuff musculature. Patient will benefit from skilled physical therapy intervention to address pain and deficits.   OBJECTIVE IMPAIRMENTS: decreased activity tolerance, decreased ROM, decreased strength, increased muscle spasms, and pain.   ACTIVITY LIMITATIONS: carrying, lifting, dressing, and reach over head  PARTICIPATION LIMITATIONS: meal prep, cleaning, laundry, and driving  REHAB POTENTIAL: Good  CLINICAL DECISION MAKING: Stable/uncomplicated  EVALUATION COMPLEXITY: Low   GOALS:  SHORT TERM GOALS: Target date: 06/08/22  Ind with an HEP. Goal status: INITIAL   LONG TERM GOALS: Target date: 08/24/22  Ind with advanced HEP. Goal status: INITIAL  2.  Active left shoulder flexion to 145 degrees so the patient can easily reach overhead. Goal status: INITIAL  3.  Active ER to 70 degrees+ to allow for easily donning/doffing of apparel. Goal status: INITIAL  4.  Increase left shoulder strength to a solid 4+/5 to increase stability for performance of functional activities. Goal status: INITIAL  5.  Perform ADL's with pain not > 3/10.   PLAN:  PT FREQUENCY: 2x/week  PT DURATION: 6 weeks  PLANNED INTERVENTIONS: Therapeutic exercises, Therapeutic activity, Patient/Family education, Self Care, Dry Needling, Electrical stimulation, Cryotherapy, Moist heat, Vasopneumatic device, Ultrasound, and Manual therapy  PLAN FOR NEXT SESSION: Combo e'stim/US, dry needling, RW4, PROM.   Christoper Bushey, [place], PT 05/25/2022, 12:12 PM

## 2022-05-31 ENCOUNTER — Ambulatory Visit: Payer: Medicare Other | Admitting: Physical Therapy

## 2022-05-31 ENCOUNTER — Other Ambulatory Visit: Payer: Medicare Other

## 2022-05-31 DIAGNOSIS — E559 Vitamin D deficiency, unspecified: Secondary | ICD-10-CM

## 2022-05-31 DIAGNOSIS — Z862 Personal history of diseases of the blood and blood-forming organs and certain disorders involving the immune mechanism: Secondary | ICD-10-CM | POA: Diagnosis not present

## 2022-05-31 DIAGNOSIS — E782 Mixed hyperlipidemia: Secondary | ICD-10-CM

## 2022-05-31 DIAGNOSIS — M25612 Stiffness of left shoulder, not elsewhere classified: Secondary | ICD-10-CM

## 2022-05-31 DIAGNOSIS — M25512 Pain in left shoulder: Secondary | ICD-10-CM | POA: Diagnosis not present

## 2022-05-31 DIAGNOSIS — I10 Essential (primary) hypertension: Secondary | ICD-10-CM

## 2022-05-31 DIAGNOSIS — M6281 Muscle weakness (generalized): Secondary | ICD-10-CM

## 2022-05-31 DIAGNOSIS — G8929 Other chronic pain: Secondary | ICD-10-CM | POA: Diagnosis not present

## 2022-05-31 LAB — URINALYSIS
Bilirubin, UA: NEGATIVE
Glucose, UA: NEGATIVE
Ketones, UA: NEGATIVE
Nitrite, UA: NEGATIVE
Protein,UA: NEGATIVE
RBC, UA: NEGATIVE
Specific Gravity, UA: 1.02 (ref 1.005–1.030)
Urobilinogen, Ur: 0.2 mg/dL (ref 0.2–1.0)
pH, UA: 7.5 (ref 5.0–7.5)

## 2022-05-31 NOTE — Therapy (Addendum)
OUTPATIENT PHYSICAL THERAPY SHOULDER EVALUATION   Patient Name: Lori Soto MRN: [id number] DOB:07/02/1935, 87 y.o., female Today's Date: 05/31/2022  END OF SESSION:  PT End of Session - 05/31/22 1230     Visit Number 2    Number of Visits 12    Date for PT Re-Evaluation 08/23/22    Authorization Type FOTO AT LEAST EVERY 5TH VISIT.  PROGRESS NOTE AT 10TH VISIT.  KX MODIFIER AFTER 15 VISITS.    PT Start Time 1115    PT Stop Time 1201    PT Time Calculation (min) 46 min    Activity Tolerance Patient tolerated treatment well    Behavior During Therapy WFL for tasks assessed/performed              Past Medical History:  Diagnosis Date   Anemia    Arthritis    Cancer ([place])    skin cancer on nose   Carpal tunnel syndrome, bilateral    Cataract    Esophagitis    GERD (gastroesophageal reflux disease)    H/O hiatal hernia    Osteopenia    Scoliosis    Shingles    SVT (supraventricular tachycardia)    Ulcerative colitis    Past Surgical History:  Procedure Laterality Date   ABDOMINAL HYSTERECTOMY     BUNIONECTOMY     Bilateral   CARPAL TUNNEL RELEASE Left 03/10/2015   Procedure: LEFT CARPAL TUNNEL RELEASE;  Surgeon: Daryll Brod, MD;  Location: [facility];  Service: Orthopedics;  Laterality: Left;   CARPAL TUNNEL RELEASE Right 10/31/2017   Procedure: RIGHT CARPAL TUNNEL RELEASE;  Surgeon: Daryll Brod, MD;  Location: [facility];  Service: Orthopedics;  Laterality: Right;   CATARACT EXTRACTION W/PHACO  05/28/2012   Procedure: CATARACT EXTRACTION PHACO AND INTRAOCULAR LENS PLACEMENT (IOC);  Surgeon: Tonny Branch, MD;  [facility];  Service: Ophthalmology;  Laterality: Right;  CDE=12.84   CATARACT EXTRACTION W/PHACO  06/07/2012   Procedure: CATARACT EXTRACTION PHACO AND INTRAOCULAR LENS PLACEMENT (IOC);  Surgeon: Tonny Branch, MD;  [facility];  Service: Ophthalmology;  Laterality: Left;  CDE 17.60   COLONOSCOPY     COLONOSCOPY N/A  07/08/2015   Procedure: COLONOSCOPY;  Surgeon: Rogene Houston, MD;  [facility];  Service: Endoscopy;  Laterality: N/A;  930   ESOPHAGEAL DILATION N/A 07/08/2015   Procedure: ESOPHAGEAL DILATION;  Surgeon: Rogene Houston, MD;  [facility];  Service: Endoscopy;  Laterality: N/A;   ESOPHAGOGASTRODUODENOSCOPY N/A 07/08/2015   Procedure: ESOPHAGOGASTRODUODENOSCOPY (EGD);  Surgeon: Rogene Houston, MD;  [facility];  Service: Endoscopy;  Laterality: N/A;   Hernia     Right inguinal   HERNIA REPAIR  1961   right inguinal hernia   UPPER GASTROINTESTINAL ENDOSCOPY     Patient Active Problem List   Diagnosis Date Noted   History of supraventricular tachycardia 11/08/2021   Cough 05/29/2020   Chest congestion 05/29/2020   Arthritis 06/13/2019   GERD (gastroesophageal reflux disease) 06/10/2019   Esophageal stricture 06/10/2019   Educated about COVID-19 virus infection 06/04/2019   SVT (supraventricular tachycardia) 08/28/2018   Elevated troponin 08/28/2018   Light headedness 05/09/2018   Carpal tunnel syndrome of right wrist 05/20/2015   Primary localized osteoarthrosis, hand 05/20/2015   Carpal tunnel syndrome on left 03/18/2015   Osteopenia of the elderly 02/19/2014   DDD (degenerative disc disease), lumbar 12/03/2013   Scoliosis 12/03/2013   Chronic LBP 05/14/2013   IBS (irritable  bowel syndrome) 11/08/2012   UC (ulcerative colitis) ([place]) 04/23/2012   Palpitation 10/26/2011   Hypertension 10/26/2011   Chronic ulcerative colitis ([place]) 03/23/2011    REFERRING PROVIDER: Ronnie Doss DO  REFERRING DIAG: Chronic left shoulder pain.  THERAPY DIAG:  Acute pain of left shoulder  Stiffness of left shoulder, not elsewhere classified  Muscle weakness (generalized)  Rationale for Evaluation and Treatment: Rehabilitation  ONSET DATE: Several months.  SUBJECTIVE:                                                                                                                                                                                       SUBJECTIVE STATEMENT: Pain not too bad but haven't done much yet. PERTINENT HISTORY: Chronic back pain.  PAIN:  Are you having pain? Yes: NPRS scale: 3/10 Pain location: Left shoulder Pain description: Ache, stiff and throbbing. Aggravating factors: Movement. Relieving factors: As above.  PRECAUTIONS: None  WEIGHT BEARING RESTRICTIONS: No  FALLS:  Has patient fallen in last 6 months? No  LIVING ENVIRONMENT: Lives with: lives alone Lives in: House/apartment Has following equipment at home: None  OCCUPATION: Retired.  PLOF: Independent  PATIENT GOALS:Use left shoulder without pain.  NEXT MD VISIT:   OBJECTIVE:   HEP:      [place] by [place] Sanyah Molnar Jan 30th, 2024 View at [email or web address] using code: [email or web address]  Page 1 of 1  2 Exercises PECTORALIS CORNER STRETCH While standing at a corner of a wall, place your arms on the walls with elobws bent so that your upper arms are horizontal and your forearms are directed upwards as shown. Take one step forward towards the corner. Bend your front knee until a stretch is felt along the front of your chest and/or shoulders. Your arms should be pointed downward towards the ground. NOTE: Your legs should control the stretch by bending or straightening your front knee. Repeat 4 Times Hold 30 Seconds Complete 1 Set Perform 4 Times a Day Shoulder Flexion Wall Stretch Standing in front of the wall, walk your fingers up the wall until your arm is raised above your hand and you feel a slight stretch in the shoulder. Repeat 10 Times Hold 5 Seconds Complete 2 Sets Perform 4 Times a Day TODAY'S TREATMENT:  DATE: Combo e'stim/US at 1.50 W/CM2 x 10 minutes to affected left  shoulder region f/b IFC at 80-150 Hz on 40% scan x 20 minutes to patient's left shoulder with vasopneumatic on low f/b STW/M to left UT and posterior cuff musculature.   ASSESSMENT:  CLINICAL IMPRESSION: Patient did very well with treatment today.  Trigger points over left UT/Supraspinatus and posterior cuff.  Instructed in HEP to improve flexion and ER.   OBJECTIVE IMPAIRMENTS: decreased activity tolerance, decreased ROM, decreased strength, increased muscle spasms, and pain.   ACTIVITY LIMITATIONS: carrying, lifting, dressing, and reach over head  PARTICIPATION LIMITATIONS: meal prep, cleaning, laundry, and driving  REHAB POTENTIAL: Good  CLINICAL DECISION MAKING: Stable/uncomplicated  EVALUATION COMPLEXITY: Low   GOALS:  SHORT TERM GOALS: Target date: 06/08/22  Ind with an HEP. Goal status: INITIAL   LONG TERM GOALS: Target date: 08/24/22  Ind with advanced HEP. Goal status: INITIAL  2.  Active left shoulder flexion to 145 degrees so the patient can easily reach overhead. Goal status: INITIAL  3.  Active ER to 70 degrees+ to allow for easily donning/doffing of apparel. Goal status: INITIAL  4.  Increase left shoulder strength to a solid 4+/5 to increase stability for performance of functional activities. Goal status: INITIAL  5.  Perform ADL's with pain not > 3/10.   PLAN:  PT FREQUENCY: 2x/week  PT DURATION: 6 weeks  PLANNED INTERVENTIONS: Therapeutic exercises, Therapeutic activity, Patient/Family education, Self Care, Dry Needling, Electrical stimulation, Cryotherapy, Moist heat, Vasopneumatic device, Ultrasound, and Manual therapy  PLAN FOR NEXT SESSION: Combo e'stim/US, dry needling, RW4, PROM.   Harriett Azar, [place], PT 05/31/2022, 12:34 PM

## 2022-06-01 LAB — CBC
Hematocrit: 39.7 % (ref 34.0–46.6)
Hemoglobin: 13.2 g/dL (ref 11.1–15.9)
MCH: 29.7 pg (ref 26.6–33.0)
MCHC: 33.2 g/dL (ref 31.5–35.7)
MCV: 89 fL (ref 79–97)
Platelets: 241 10*3/uL (ref 150–450)
RBC: 4.45 x10E6/uL (ref 3.77–5.28)
RDW: 12.9 % (ref 11.7–15.4)
WBC: 4.2 10*3/uL (ref 3.4–10.8)

## 2022-06-01 LAB — CMP14+EGFR
ALT: 17 IU/L (ref 0–32)
AST: 24 IU/L (ref 0–40)
Albumin/Globulin Ratio: 1.7 (ref 1.2–2.2)
Albumin: 4.2 g/dL (ref 3.7–4.7)
Alkaline Phosphatase: 40 IU/L — ABNORMAL LOW (ref 44–121)
BUN/Creatinine Ratio: 12 (ref 12–28)
BUN: 9 mg/dL (ref 8–27)
Bilirubin Total: 0.6 mg/dL (ref 0.0–1.2)
CO2: 25 mmol/L (ref 20–29)
Calcium: 9.6 mg/dL (ref 8.7–10.3)
Chloride: 102 mmol/L (ref 96–106)
Creatinine, Ser: 0.77 mg/dL (ref 0.57–1.00)
Globulin, Total: 2.5 g/dL (ref 1.5–4.5)
Glucose: 82 mg/dL (ref 70–99)
Potassium: 4.6 mmol/L (ref 3.5–5.2)
Sodium: 143 mmol/L (ref 134–144)
Total Protein: 6.7 g/dL (ref 6.0–8.5)
eGFR: 76 mL/min/{1.73_m2} (ref >59)

## 2022-06-01 LAB — TSH: TSH: 1.62 u[IU]/mL (ref 0.450–4.500)

## 2022-06-01 LAB — LIPID PANEL
Chol/HDL Ratio: 2.9 ratio (ref 0.0–4.4)
Cholesterol, Total: 178 mg/dL (ref 100–199)
HDL: 61 mg/dL (ref >39)
LDL Chol Calc (NIH): 107 mg/dL — ABNORMAL HIGH (ref 0–99)
Triglycerides: 50 mg/dL (ref 0–149)
VLDL Cholesterol Cal: 10 mg/dL (ref 5–40)

## 2022-06-01 LAB — VITAMIN D 25 HYDROXY (VIT D DEFICIENCY, FRACTURES): Vit D, 25-Hydroxy: 43.3 ng/mL (ref 30.0–100.0)

## 2022-06-02 ENCOUNTER — Ambulatory Visit: Payer: Medicare Other | Attending: Family Medicine | Admitting: Physical Therapy

## 2022-06-02 ENCOUNTER — Encounter: Payer: Self-pay | Admitting: Physical Therapy

## 2022-06-02 DIAGNOSIS — M6281 Muscle weakness (generalized): Secondary | ICD-10-CM | POA: Diagnosis not present

## 2022-06-02 DIAGNOSIS — M25612 Stiffness of left shoulder, not elsewhere classified: Secondary | ICD-10-CM | POA: Diagnosis not present

## 2022-06-02 DIAGNOSIS — M25512 Pain in left shoulder: Secondary | ICD-10-CM | POA: Insufficient documentation

## 2022-06-02 NOTE — Therapy (Signed)
OUTPATIENT PHYSICAL THERAPY SHOULDER EVALUATION   Patient Name: Lori Soto MRN: [id number] DOB:29-Jul-1935, 87 y.o., female Today's Date: 06/02/2022  END OF SESSION:  PT End of Session - 06/02/22 1118     Visit Number 3    Number of Visits 12    Date for PT Re-Evaluation 08/23/22    Authorization Type FOTO AT LEAST EVERY 5TH VISIT.  PROGRESS NOTE AT 10TH VISIT.  KX MODIFIER AFTER 15 VISITS.    PT Start Time 1118    PT Stop Time 1203    PT Time Calculation (min) 45 min    Activity Tolerance Patient tolerated treatment well    Behavior During Therapy WFL for tasks assessed/performed            Past Medical History:  Diagnosis Date   Anemia    Arthritis    Cancer ([place])    skin cancer on nose   Carpal tunnel syndrome, bilateral    Cataract    Esophagitis    GERD (gastroesophageal reflux disease)    H/O hiatal hernia    Osteopenia    Scoliosis    Shingles    SVT (supraventricular tachycardia)    Ulcerative colitis    Past Surgical History:  Procedure Laterality Date   ABDOMINAL HYSTERECTOMY     BUNIONECTOMY     Bilateral   CARPAL TUNNEL RELEASE Left 03/10/2015   Procedure: LEFT CARPAL TUNNEL RELEASE;  Surgeon: Daryll Brod, MD;  Location: [facility];  Service: Orthopedics;  Laterality: Left;   CARPAL TUNNEL RELEASE Right 10/31/2017   Procedure: RIGHT CARPAL TUNNEL RELEASE;  Surgeon: Daryll Brod, MD;  Location: [facility];  Service: Orthopedics;  Laterality: Right;   CATARACT EXTRACTION W/PHACO  05/28/2012   Procedure: CATARACT EXTRACTION PHACO AND INTRAOCULAR LENS PLACEMENT (IOC);  Surgeon: Tonny Branch, MD;  [facility];  Service: Ophthalmology;  Laterality: Right;  CDE=12.84   CATARACT EXTRACTION W/PHACO  06/07/2012   Procedure: CATARACT EXTRACTION PHACO AND INTRAOCULAR LENS PLACEMENT (IOC);  Surgeon: Tonny Branch, MD;  [facility];  Service: Ophthalmology;  Laterality: Left;  CDE 17.60   COLONOSCOPY     COLONOSCOPY N/A 07/08/2015    Procedure: COLONOSCOPY;  Surgeon: Rogene Houston, MD;  [facility];  Service: Endoscopy;  Laterality: N/A;  930   ESOPHAGEAL DILATION N/A 07/08/2015   Procedure: ESOPHAGEAL DILATION;  Surgeon: Rogene Houston, MD;  [facility];  Service: Endoscopy;  Laterality: N/A;   ESOPHAGOGASTRODUODENOSCOPY N/A 07/08/2015   Procedure: ESOPHAGOGASTRODUODENOSCOPY (EGD);  Surgeon: Rogene Houston, MD;  [facility];  Service: Endoscopy;  Laterality: N/A;   Hernia     Right inguinal   HERNIA REPAIR  1961   right inguinal hernia   UPPER GASTROINTESTINAL ENDOSCOPY     Patient Active Problem List   Diagnosis Date Noted   History of supraventricular tachycardia 11/08/2021   Cough 05/29/2020   Chest congestion 05/29/2020   Arthritis 06/13/2019   GERD (gastroesophageal reflux disease) 06/10/2019   Esophageal stricture 06/10/2019   Educated about COVID-19 virus infection 06/04/2019   SVT (supraventricular tachycardia) 08/28/2018   Elevated troponin 08/28/2018   Light headedness 05/09/2018   Carpal tunnel syndrome of right wrist 05/20/2015   Primary localized osteoarthrosis, hand 05/20/2015   Carpal tunnel syndrome on left 03/18/2015   Osteopenia of the elderly 02/19/2014   DDD (degenerative disc disease), lumbar 12/03/2013   Scoliosis 12/03/2013   Chronic LBP 05/14/2013   IBS (irritable bowel syndrome)  11/08/2012   UC (ulcerative colitis) ([place]) 04/23/2012   Palpitation 10/26/2011   Hypertension 10/26/2011   Chronic ulcerative colitis ([place]) 03/23/2011   REFERRING PROVIDER: Ronnie Doss DO  REFERRING DIAG: Chronic left shoulder pain.  THERAPY DIAG:  Acute pain of left shoulder  Stiffness of left shoulder, not elsewhere classified  Muscle weakness (generalized)  Rationale for Evaluation and Treatment: Rehabilitation  ONSET DATE: Several months.  SUBJECTIVE:                                                                                                                                                                                       SUBJECTIVE STATEMENT: Feels some better since she got out of the shower.  PERTINENT HISTORY: Chronic back pain.  PAIN:  Are you having pain? Yes: NPRS scale: 3/10 Pain location: Left shoulder Pain description: Ache, stiff and throbbing. Aggravating factors: Movement. Relieving factors: As above.  PRECAUTIONS: None  PATIENT GOALS:Use left shoulder without pain.  NEXT MD VISIT:   OBJECTIVE:   HEP:     [place] by Mali Applegate Jan 30th, 2024 View at [email or web address] using code: [email or web address]  Page 1 of 1  2 Exercises PECTORALIS CORNER STRETCH While standing at a corner of a wall, place your arms on the walls with elobws bent so that your upper arms are horizontal and your forearms are directed upwards as shown. Take one step forward towards the corner. Bend your front knee until a stretch is felt along the front of your chest and/or shoulders. Your arms should be pointed downward towards the ground. NOTE: Your legs should control the stretch by bending or straightening your front knee. Repeat 4 Times Hold 30 Seconds Complete 1 Set Perform 4 Times a Day Shoulder Flexion Wall Stretch Standing in front of the wall, walk your fingers up the wall until your arm is raised above your hand and you feel a slight stretch in the shoulder. Repeat 10 Times Hold 5 Seconds Complete 2 Sets Perform 4 Times a Day  TODAY'S TREATMENT:  DATE: Modalities  Date:  Unattended Estim: Shoulder, IFC, 15 mins, Pain Combo: Shoulder, 1.5 w/cm2, 100%, 1 mhz, 10 mins, Pain and Tone  Manual Therapy Soft Tissue Mobilization: L posterior cuff/ UT, to reduce tone and pain    ASSESSMENT:  CLINICAL IMPRESSION: Patient presented in clinic with reports of  pain in anterior shoulder as well as posterior shoulder with varying activities. Patient had TP notable in L UT today. Patient did report tenderness with manual therapy to L UT and posterior cuff. Normal modalities response noted following removal of the modalities.  OBJECTIVE IMPAIRMENTS: decreased activity tolerance, decreased ROM, decreased strength, increased muscle spasms, and pain.   ACTIVITY LIMITATIONS: carrying, lifting, dressing, and reach over head  PARTICIPATION LIMITATIONS: meal prep, cleaning, laundry, and driving  REHAB POTENTIAL: Good  CLINICAL DECISION MAKING: Stable/uncomplicated  EVALUATION COMPLEXITY: Low  GOALS:  SHORT TERM GOALS: Target date: 06/08/22  Ind with an HEP. Goal status: INITIAL  LONG TERM GOALS: Target date: 08/24/22  Ind with advanced HEP. Goal status: INITIAL  2.  Active left shoulder flexion to 145 degrees so the patient can easily reach overhead. Goal status: INITIAL  3.  Active ER to 70 degrees+ to allow for easily donning/doffing of apparel. Goal status: INITIAL  4.  Increase left shoulder strength to a solid 4+/5 to increase stability for performance of functional activities. Goal status: INITIAL  5.  Perform ADL's with pain not > 3/10.   PLAN:  PT FREQUENCY: 2x/week  PT DURATION: 6 weeks  PLANNED INTERVENTIONS: Therapeutic exercises, Therapeutic activity, Patient/Family education, Self Care, Dry Needling, Electrical stimulation, Cryotherapy, Moist heat, Vasopneumatic device, Ultrasound, and Manual therapy  PLAN FOR NEXT SESSION: Combo e'stim/US, dry needling, RW4, PROM.   Standley Brooking, PTA 06/02/2022, 12:08 PM

## 2022-06-07 ENCOUNTER — Ambulatory Visit: Payer: Medicare Other | Admitting: Physical Therapy

## 2022-06-07 DIAGNOSIS — M25612 Stiffness of left shoulder, not elsewhere classified: Secondary | ICD-10-CM | POA: Diagnosis not present

## 2022-06-07 DIAGNOSIS — M6281 Muscle weakness (generalized): Secondary | ICD-10-CM | POA: Diagnosis not present

## 2022-06-07 DIAGNOSIS — M25512 Pain in left shoulder: Secondary | ICD-10-CM | POA: Diagnosis not present

## 2022-06-07 NOTE — Therapy (Signed)
OUTPATIENT PHYSICAL THERAPY SHOULDER TREATMENT   Patient Name: Lori Soto MRN: [id number] DOB:October 03, 1935, 87 y.o., female Today's Date: 06/07/2022  END OF SESSION:  PT End of Session - 06/07/22 1220     Visit Number 4    Number of Visits 12    Date for PT Re-Evaluation 08/23/22    Authorization Type FOTO AT LEAST EVERY 5TH VISIT.  PROGRESS NOTE AT 10TH VISIT.  KX MODIFIER AFTER 15 VISITS.    PT Start Time 1115    PT Stop Time 1211    PT Time Calculation (min) 56 min    Activity Tolerance Patient tolerated treatment well    Behavior During Therapy WFL for tasks assessed/performed            Past Medical History:  Diagnosis Date   Anemia    Arthritis    Cancer ([place])    skin cancer on nose   Carpal tunnel syndrome, bilateral    Cataract    Esophagitis    GERD (gastroesophageal reflux disease)    H/O hiatal hernia    Osteopenia    Scoliosis    Shingles    SVT (supraventricular tachycardia)    Ulcerative colitis    Past Surgical History:  Procedure Laterality Date   ABDOMINAL HYSTERECTOMY     BUNIONECTOMY     Bilateral   CARPAL TUNNEL RELEASE Left 03/10/2015   Procedure: LEFT CARPAL TUNNEL RELEASE;  Surgeon: Daryll Brod, MD;  Location: [facility];  Service: Orthopedics;  Laterality: Left;   CARPAL TUNNEL RELEASE Right 10/31/2017   Procedure: RIGHT CARPAL TUNNEL RELEASE;  Surgeon: Daryll Brod, MD;  Location: [facility];  Service: Orthopedics;  Laterality: Right;   CATARACT EXTRACTION W/PHACO  05/28/2012   Procedure: CATARACT EXTRACTION PHACO AND INTRAOCULAR LENS PLACEMENT (IOC);  Surgeon: Tonny Branch, MD;  [facility];  Service: Ophthalmology;  Laterality: Right;  CDE=12.84   CATARACT EXTRACTION W/PHACO  06/07/2012   Procedure: CATARACT EXTRACTION PHACO AND INTRAOCULAR LENS PLACEMENT (IOC);  Surgeon: Tonny Branch, MD;  [facility];  Service: Ophthalmology;  Laterality: Left;  CDE 17.60   COLONOSCOPY     COLONOSCOPY N/A 07/08/2015    Procedure: COLONOSCOPY;  Surgeon: Rogene Houston, MD;  [facility];  Service: Endoscopy;  Laterality: N/A;  930   ESOPHAGEAL DILATION N/A 07/08/2015   Procedure: ESOPHAGEAL DILATION;  Surgeon: Rogene Houston, MD;  [facility];  Service: Endoscopy;  Laterality: N/A;   ESOPHAGOGASTRODUODENOSCOPY N/A 07/08/2015   Procedure: ESOPHAGOGASTRODUODENOSCOPY (EGD);  Surgeon: Rogene Houston, MD;  [facility];  Service: Endoscopy;  Laterality: N/A;   Hernia     Right inguinal   HERNIA REPAIR  1961   right inguinal hernia   UPPER GASTROINTESTINAL ENDOSCOPY     Patient Active Problem List   Diagnosis Date Noted   History of supraventricular tachycardia 11/08/2021   Cough 05/29/2020   Chest congestion 05/29/2020   Arthritis 06/13/2019   GERD (gastroesophageal reflux disease) 06/10/2019   Esophageal stricture 06/10/2019   Educated about COVID-19 virus infection 06/04/2019   SVT (supraventricular tachycardia) 08/28/2018   Elevated troponin 08/28/2018   Light headedness 05/09/2018   Carpal tunnel syndrome of right wrist 05/20/2015   Primary localized osteoarthrosis, hand 05/20/2015   Carpal tunnel syndrome on left 03/18/2015   Osteopenia of the elderly 02/19/2014   DDD (degenerative disc disease), lumbar 12/03/2013   Scoliosis 12/03/2013   Chronic LBP 05/14/2013   IBS (irritable bowel syndrome)  11/08/2012   UC (ulcerative colitis) ([place]) 04/23/2012   Palpitation 10/26/2011   Hypertension 10/26/2011   Chronic ulcerative colitis ([place]) 03/23/2011   REFERRING PROVIDER: Ronnie Doss DO  REFERRING DIAG: Chronic left shoulder pain.  THERAPY DIAG:  Acute pain of left shoulder  Stiffness of left shoulder, not elsewhere classified  Muscle weakness (generalized)  Rationale for Evaluation and Treatment: Rehabilitation  ONSET DATE: Several months.  SUBJECTIVE:                                                                                                                                                                                       SUBJECTIVE STATEMENT: Feels better but haven't done much yet.  PERTINENT HISTORY: Chronic back pain.  PAIN:  Are you having pain? Yes: NPRS scale: 2-3/10 Pain location: Left shoulder Pain description: Ache, stiff and throbbing. Aggravating factors: Movement. Relieving factors: As above.  PRECAUTIONS: None  PATIENT GOALS:Use left shoulder without pain.  NEXT MD VISIT:   OBJECTIVE:   HEP:     [place] by [place] Adamarys Shall Jan 30th, 2024 View at [email or web address] using code: [email or web address]  Page 1 of 1  2 Exercises PECTORALIS CORNER STRETCH While standing at a corner of a wall, place your arms on the walls with elobws bent so that your upper arms are horizontal and your forearms are directed upwards as shown. Take one step forward towards the corner. Bend your front knee until a stretch is felt along the front of your chest and/or shoulders. Your arms should be pointed downward towards the ground. NOTE: Your legs should control the stretch by bending or straightening your front knee. Repeat 4 Times Hold 30 Seconds Complete 1 Set Perform 4 Times a Day Shoulder Flexion Wall Stretch Standing in front of the wall, walk your fingers up the wall until your arm is raised above your hand and you feel a slight stretch in the shoulder. Repeat 10 Times Hold 5 Seconds Complete 2 Sets Perform 4 Times a Day  TODAY'S TREATMENT:  DATE: 06/07/22:                                     EXERCISE LOG  Exercise Comments  UBE 120 RPM's x 6 minutes ( 3 mins for and 3 mins backward)        Combo e'stim/US with small soundhead x 7 minutes to patient affected left shoulder f/b STW/M x 10 minutes with ischemic release technique to patient's posterior cuff  musculature to reduce tone f/b IFC at 80-150 Hz on 40% scan x 15 minutes and vasopneumatic x 15 minutes.  Patient tolerated treatment without complaint with normal modality response following removal of modality.  ASSESSMENT:  CLINICAL IMPRESSION: Patient has been compliant to her HEP.  Pain low this morning but she has not done much yet today.   OBJECTIVE IMPAIRMENTS: decreased activity tolerance, decreased ROM, decreased strength, increased muscle spasms, and pain.   ACTIVITY LIMITATIONS: carrying, lifting, dressing, and reach over head  PARTICIPATION LIMITATIONS: meal prep, cleaning, laundry, and driving  REHAB POTENTIAL: Good  CLINICAL DECISION MAKING: Stable/uncomplicated  EVALUATION COMPLEXITY: Low  GOALS:  SHORT TERM GOALS: Target date: 06/08/22  Ind with an HEP. Goal status: INITIAL  LONG TERM GOALS: Target date: 08/24/22  Ind with advanced HEP. Goal status: INITIAL  2.  Active left shoulder flexion to 145 degrees so the patient can easily reach overhead. Goal status: INITIAL  3.  Active ER to 70 degrees+ to allow for easily donning/doffing of apparel. Goal status: INITIAL  4.  Increase left shoulder strength to a solid 4+/5 to increase stability for performance of functional activities. Goal status: INITIAL  5.  Perform ADL's with pain not > 3/10.   PLAN:  PT FREQUENCY: 2x/week  PT DURATION: 6 weeks  PLANNED INTERVENTIONS: Therapeutic exercises, Therapeutic activity, Patient/Family education, Self Care, Dry Needling, Electrical stimulation, Cryotherapy, Moist heat, Vasopneumatic device, Ultrasound, and Manual therapy  PLAN FOR NEXT SESSION: Combo e'stim/US, dry needling, RW4, PROM.   Katianne Barre, [place], PT 06/07/2022, 12:21 PM

## 2022-06-09 ENCOUNTER — Ambulatory Visit: Payer: Medicare Other

## 2022-06-09 DIAGNOSIS — M25612 Stiffness of left shoulder, not elsewhere classified: Secondary | ICD-10-CM | POA: Diagnosis not present

## 2022-06-09 DIAGNOSIS — M25512 Pain in left shoulder: Secondary | ICD-10-CM

## 2022-06-09 DIAGNOSIS — M6281 Muscle weakness (generalized): Secondary | ICD-10-CM

## 2022-06-09 NOTE — Therapy (Signed)
OUTPATIENT PHYSICAL THERAPY SHOULDER TREATMENT   Patient Name: Lori Soto MRN: [id number] DOB:1935-06-04, 87 y.o., female Today's Date: 06/09/2022  END OF SESSION:  PT End of Session - 06/09/22 1123     Visit Number 5    Number of Visits 12    Date for PT Re-Evaluation 08/23/22    Authorization Type FOTO AT LEAST EVERY 5TH VISIT.  PROGRESS NOTE AT 10TH VISIT.  KX MODIFIER AFTER 15 VISITS.    PT Start Time 1115    PT Stop Time 1215    PT Time Calculation (min) 60 min    Activity Tolerance Patient tolerated treatment well    Behavior During Therapy WFL for tasks assessed/performed            Past Medical History:  Diagnosis Date   Anemia    Arthritis    Cancer ([place])    skin cancer on nose   Carpal tunnel syndrome, bilateral    Cataract    Esophagitis    GERD (gastroesophageal reflux disease)    H/O hiatal hernia    Osteopenia    Scoliosis    Shingles    SVT (supraventricular tachycardia)    Ulcerative colitis    Past Surgical History:  Procedure Laterality Date   ABDOMINAL HYSTERECTOMY     BUNIONECTOMY     Bilateral   CARPAL TUNNEL RELEASE Left 03/10/2015   Procedure: LEFT CARPAL TUNNEL RELEASE;  Surgeon: Daryll Brod, MD;  Location: [place];  Service: Orthopedics;  Laterality: Left;   CARPAL TUNNEL RELEASE Right 10/31/2017   Procedure: RIGHT CARPAL TUNNEL RELEASE;  Surgeon: Daryll Brod, MD;  Location: [place];  Service: Orthopedics;  Laterality: Right;   CATARACT EXTRACTION W/PHACO  05/28/2012   Procedure: CATARACT EXTRACTION PHACO AND INTRAOCULAR LENS PLACEMENT (IOC);  Surgeon: Tonny Branch, MD;  [facility];  Service: Ophthalmology;  Laterality: Right;  CDE=12.84   CATARACT EXTRACTION W/PHACO  06/07/2012   Procedure: CATARACT EXTRACTION PHACO AND INTRAOCULAR LENS PLACEMENT (IOC);  Surgeon: Tonny Branch, MD;  [facility];  Service: Ophthalmology;  Laterality: Left;  CDE 17.60   COLONOSCOPY     COLONOSCOPY N/A 07/08/2015    Procedure: COLONOSCOPY;  Surgeon: Rogene Houston, MD;  [facility];  Service: Endoscopy;  Laterality: N/A;  930   ESOPHAGEAL DILATION N/A 07/08/2015   Procedure: ESOPHAGEAL DILATION;  Surgeon: Rogene Houston, MD;  [facility];  Service: Endoscopy;  Laterality: N/A;   ESOPHAGOGASTRODUODENOSCOPY N/A 07/08/2015   Procedure: ESOPHAGOGASTRODUODENOSCOPY (EGD);  Surgeon: Rogene Houston, MD;  [facility];  Service: Endoscopy;  Laterality: N/A;   Hernia     Right inguinal   HERNIA REPAIR  1961   right inguinal hernia   UPPER GASTROINTESTINAL ENDOSCOPY     Patient Active Problem List   Diagnosis Date Noted   History of supraventricular tachycardia 11/08/2021   Cough 05/29/2020   Chest congestion 05/29/2020   Arthritis 06/13/2019   GERD (gastroesophageal reflux disease) 06/10/2019   Esophageal stricture 06/10/2019   Educated about COVID-19 virus infection 06/04/2019   SVT (supraventricular tachycardia) 08/28/2018   Elevated troponin 08/28/2018   Light headedness 05/09/2018   Carpal tunnel syndrome of right wrist 05/20/2015   Primary localized osteoarthrosis, hand 05/20/2015   Carpal tunnel syndrome on left 03/18/2015   Osteopenia of the elderly 02/19/2014   DDD (degenerative disc disease), lumbar 12/03/2013   Scoliosis 12/03/2013   Chronic LBP 05/14/2013   IBS (irritable bowel syndrome)  11/08/2012   UC (ulcerative colitis) ([place]) 04/23/2012   Palpitation 10/26/2011   Hypertension 10/26/2011   Chronic ulcerative colitis ([place]) 03/23/2011   REFERRING PROVIDER: Ronnie Doss DO  REFERRING DIAG: Chronic left shoulder pain.  THERAPY DIAG:  Acute pain of left shoulder  Stiffness of left shoulder, not elsewhere classified  Muscle weakness (generalized)  Rationale for Evaluation and Treatment: Rehabilitation  ONSET DATE: Several months.  SUBJECTIVE:                                                                                                                                                                                       SUBJECTIVE STATEMENT: Pt reports increased pain in posterior shoulder today.  PERTINENT HISTORY: Chronic back pain.  PAIN:  Are you having pain? Yes: NPRS scale: 8/10 Pain location: Left shoulder Pain description: Ache, stiff and throbbing. Aggravating factors: Movement. Relieving factors: As above.  PRECAUTIONS: None  PATIENT GOALS:Use left shoulder without pain.  NEXT MD VISIT:   OBJECTIVE:   HEP:     [place] by Mali Applegate Jan 30th, 2024 View at [email or web address] using code: [email or web address]  Page 1 of 1  2 Exercises PECTORALIS CORNER STRETCH While standing at a corner of a wall, place your arms on the walls with elobws bent so that your upper arms are horizontal and your forearms are directed upwards as shown. Take one step forward towards the corner. Bend your front knee until a stretch is felt along the front of your chest and/or shoulders. Your arms should be pointed downward towards the ground. NOTE: Your legs should control the stretch by bending or straightening your front knee. Repeat 4 Times Hold 30 Seconds Complete 1 Set Perform 4 Times a Day Shoulder Flexion Wall Stretch Standing in front of the wall, walk your fingers up the wall until your arm is raised above your hand and you feel a slight stretch in the shoulder. Repeat 10 Times Hold 5 Seconds Complete 2 Sets Perform 4 Times a Day  TODAY'S TREATMENT:  DATE: 06/09/22:                                     EXERCISE LOG  Exercise Comments  UBE 120 RPM's x 8 minutes ( 4 mins for and 4 mins backward)         Combo e'stim/US with small soundhead x 8 minutes to patient affected left shoulder f/b STW/M with ischemic release technique to patient's posterior cuff  musculature to reduce tone f/b IFC at 80-150 Hz on 40% scan x 15 minutes and vasopneumatic x 15 minutes.  Patient tolerated treatment without complaint with normal modality response following removal of modality.  ASSESSMENT:  CLINICAL IMPRESSION: Pt arrives for today's treatment session reporting 8/10 left posterior shoulder pain.  Pt able to increased FOTO score to 57 today.  Pt able to tolerate increased time on UBE today without complaint of pain or discomfort.  STW/M performed to posterior shoulder musculature  and triceps to decrease pain and tone.  Normal responses to all modalities noted upon removal.  Pt reported decreased pain at completion of today's treatment session.   OBJECTIVE IMPAIRMENTS: decreased activity tolerance, decreased ROM, decreased strength, increased muscle spasms, and pain.   ACTIVITY LIMITATIONS: carrying, lifting, dressing, and reach over head  PARTICIPATION LIMITATIONS: meal prep, cleaning, laundry, and driving  REHAB POTENTIAL: Good  CLINICAL DECISION MAKING: Stable/uncomplicated  EVALUATION COMPLEXITY: Low  GOALS:  SHORT TERM GOALS: Target date: 06/08/22  Ind with an HEP. Goal status: MET  LONG TERM GOALS: Target date: 08/24/22  Ind with advanced HEP. Goal status: IN PROGRESS  2.  Active left shoulder flexion to 145 degrees so the patient can easily reach overhead. Goal status: IN PROGRESS  3.  Active ER to 70 degrees+ to allow for easily donning/doffing of apparel. Goal status: IN PROGRESS  4.  Increase left shoulder strength to a solid 4+/5 to increase stability for performance of functional activities. Goal status: IN PROGRESS  5.  Perform ADL's with pain not > 3/10. Goal status: IN PROGRESS  PLAN:  PT FREQUENCY: 2x/week  PT DURATION: 6 weeks  PLANNED INTERVENTIONS: Therapeutic exercises, Therapeutic activity, Patient/Family education, Self Care, Dry Needling, Electrical stimulation, Cryotherapy, Moist heat, Vasopneumatic device,  Ultrasound, and Manual therapy  PLAN FOR NEXT SESSION: Combo e'stim/US, dry needling, RW4, PROM.   Kathrynn Ducking, PTA 06/09/2022, 12:17 PM

## 2022-06-14 ENCOUNTER — Ambulatory Visit: Payer: Medicare Other | Admitting: Physical Therapy

## 2022-06-14 ENCOUNTER — Encounter: Payer: Self-pay | Admitting: Physical Therapy

## 2022-06-14 DIAGNOSIS — M6281 Muscle weakness (generalized): Secondary | ICD-10-CM

## 2022-06-14 DIAGNOSIS — M25512 Pain in left shoulder: Secondary | ICD-10-CM | POA: Diagnosis not present

## 2022-06-14 DIAGNOSIS — M25612 Stiffness of left shoulder, not elsewhere classified: Secondary | ICD-10-CM | POA: Diagnosis not present

## 2022-06-14 NOTE — Therapy (Signed)
OUTPATIENT PHYSICAL THERAPY SHOULDER TREATMENT  Patient Name: Lori Soto MRN: [id number] DOB:[birth year], 87 y.o., female Today's Date: 06/14/2022  END OF SESSION:  PT End of Session - 06/14/22 1117     Visit Number 6    Number of Visits 12    Date for PT Re-Evaluation 08/23/22    Authorization Type FOTO AT LEAST EVERY 5TH VISIT.  PROGRESS NOTE AT 10TH VISIT.  KX MODIFIER AFTER 15 VISITS.    PT Start Time 1117    PT Stop Time 1159    PT Time Calculation (min) 42 min    Activity Tolerance Patient tolerated treatment well    Behavior During Therapy WFL for tasks assessed/performed            Past Medical History:  Diagnosis Date   Anemia    Arthritis    Cancer ([place])    skin cancer on nose   Carpal tunnel syndrome, bilateral    Cataract    Esophagitis    GERD (gastroesophageal reflux disease)    H/O hiatal hernia    Osteopenia    Scoliosis    Shingles    SVT (supraventricular tachycardia)    Ulcerative colitis    Past Surgical History:  Procedure Laterality Date   ABDOMINAL HYSTERECTOMY     BUNIONECTOMY     Bilateral   CARPAL TUNNEL RELEASE Left 03/10/2015   Procedure: LEFT CARPAL TUNNEL RELEASE;  Surgeon: Daryll Brod, MD;  Location: [place];  Service: Orthopedics;  Laterality: Left;   CARPAL TUNNEL RELEASE Right 10/31/2017   Procedure: RIGHT CARPAL TUNNEL RELEASE;  Surgeon: Daryll Brod, MD;  Location: [place];  Service: Orthopedics;  Laterality: Right;   CATARACT EXTRACTION W/PHACO  05/28/2012   Procedure: CATARACT EXTRACTION PHACO AND INTRAOCULAR LENS PLACEMENT (IOC);  Surgeon: Tonny Branch, MD;  [facility];  Service: Ophthalmology;  Laterality: Right;  CDE=12.84   CATARACT EXTRACTION W/PHACO  06/07/2012   Procedure: CATARACT EXTRACTION PHACO AND INTRAOCULAR LENS PLACEMENT (IOC);  Surgeon: Tonny Branch, MD;  [facility];  Service: Ophthalmology;  Laterality: Left;  CDE 17.60   COLONOSCOPY     COLONOSCOPY N/A 07/08/2015    Procedure: COLONOSCOPY;  Surgeon: Rogene Houston, MD;  [facility];  Service: Endoscopy;  Laterality: N/A;  930   ESOPHAGEAL DILATION N/A 07/08/2015   Procedure: ESOPHAGEAL DILATION;  Surgeon: Rogene Houston, MD;  [facility];  Service: Endoscopy;  Laterality: N/A;   ESOPHAGOGASTRODUODENOSCOPY N/A 07/08/2015   Procedure: ESOPHAGOGASTRODUODENOSCOPY (EGD);  Surgeon: Rogene Houston, MD;  [facility];  Service: Endoscopy;  Laterality: N/A;   Hernia     Right inguinal   HERNIA REPAIR  1961   right inguinal hernia   UPPER GASTROINTESTINAL ENDOSCOPY     Patient Active Problem List   Diagnosis Date Noted   History of supraventricular tachycardia 11/08/2021   Cough 05/29/2020   Chest congestion 05/29/2020   Arthritis 06/13/2019   GERD (gastroesophageal reflux disease) 06/10/2019   Esophageal stricture 06/10/2019   Educated about COVID-19 virus infection 06/04/2019   SVT (supraventricular tachycardia) 08/28/2018   Elevated troponin 08/28/2018   Light headedness 05/09/2018   Carpal tunnel syndrome of right wrist 05/20/2015   Primary localized osteoarthrosis, hand 05/20/2015   Carpal tunnel syndrome on left 03/18/2015   Osteopenia of the elderly 02/19/2014   DDD (degenerative disc disease), lumbar 12/03/2013   Scoliosis 12/03/2013   Chronic LBP 05/14/2013   IBS (irritable bowel syndrome) 11/08/2012  UC (ulcerative colitis) ([place]) 04/23/2012   Palpitation 10/26/2011   Hypertension 10/26/2011   Chronic ulcerative colitis ([place]) 03/23/2011   REFERRING PROVIDER: Ronnie Doss DO  REFERRING DIAG: Chronic left shoulder pain.  THERAPY DIAG:  Acute pain of left shoulder  Stiffness of left shoulder, not elsewhere classified  Muscle weakness (generalized)  Rationale for Evaluation and Treatment: Rehabilitation  ONSET DATE: Several months.  SUBJECTIVE:                                                                                                                                                                                       SUBJECTIVE STATEMENT: Pt reports increased pain in posterior shoulder today.  PERTINENT HISTORY: Chronic back pain.  PAIN:  Are you having pain? Yes: NPRS scale: 8/10 Pain location: Left shoulder Pain description: Ache, stiff and throbbing. Aggravating factors: Movement. Relieving factors: As above.  PRECAUTIONS: None  PATIENT GOALS:Use left shoulder without pain.  NEXT MD VISIT:   OBJECTIVE:   HEP:     [place] by Mali Applegate Jan 30th, 2024 View at [email or web address] using code: [email or web address]  Page 1 of 1  2 Exercises PECTORALIS CORNER STRETCH While standing at a corner of a wall, place your arms on the walls with elobws bent so that your upper arms are horizontal and your forearms are directed upwards as shown. Take one step forward towards the corner. Bend your front knee until a stretch is felt along the front of your chest and/or shoulders. Your arms should be pointed downward towards the ground. NOTE: Your legs should control the stretch by bending or straightening your front knee. Repeat 4 Times Hold 30 Seconds Complete 1 Set Perform 4 Times a Day Shoulder Flexion Wall Stretch Standing in front of the wall, walk your fingers up the wall until your arm is raised above your hand and you feel a slight stretch in the shoulder. Repeat 10 Times Hold 5 Seconds Complete 2 Sets Perform 4 Times a Day  TODAY'S TREATMENT:  DATE: 06/14/22:                                     EXERCISE LOG  Exercise Comments  UBE 120 RPM's x 8 minutes ( 4 mins for and 4 mins backward)         Manual Therapy Soft Tissue Mobilization: L shoulder, UT, to reduce tightness especially in UT    Modalities  Date:  Unattended Estim: Shoulder, IFC,  15 mins, Pain  ASSESSMENT:  CLINICAL IMPRESSION: Patient presented in clinic with reports of generalized soreness  and stiffness in LE and UE. Patient had no complaints with UBE. Patient did report tightness of the UT today and some improvement with ROM such as flexion. Normal stimulation response noted following removal of the modality.  OBJECTIVE IMPAIRMENTS: decreased activity tolerance, decreased ROM, decreased strength, increased muscle spasms, and pain.   ACTIVITY LIMITATIONS: carrying, lifting, dressing, and reach over head  PARTICIPATION LIMITATIONS: meal prep, cleaning, laundry, and driving  REHAB POTENTIAL: Good  CLINICAL DECISION MAKING: Stable/uncomplicated  EVALUATION COMPLEXITY: Low  GOALS:  SHORT TERM GOALS: Target date: 06/08/22  Ind with an HEP. Goal status: MET  LONG TERM GOALS: Target date: 08/24/22  Ind with advanced HEP. Goal status: IN PROGRESS  2.  Active left shoulder flexion to 145 degrees so the patient can easily reach overhead. Goal status: IN PROGRESS  3.  Active ER to 70 degrees+ to allow for easily donning/doffing of apparel. Goal status: IN PROGRESS  4.  Increase left shoulder strength to a solid 4+/5 to increase stability for performance of functional activities. Goal status: IN PROGRESS  5.  Perform ADL's with pain not > 3/10. Goal status: IN PROGRESS  PLAN:  PT FREQUENCY: 2x/week  PT DURATION: 6 weeks  PLANNED INTERVENTIONS: Therapeutic exercises, Therapeutic activity, Patient/Family education, Self Care, Dry Needling, Electrical stimulation, Cryotherapy, Moist heat, Vasopneumatic device, Ultrasound, and Manual therapy  PLAN FOR NEXT SESSION: Combo e'stim/US, dry needling, RW4, PROM.   Standley Brooking, PTA 06/14/2022, 12:06 PM

## 2022-06-16 ENCOUNTER — Encounter: Payer: Self-pay | Admitting: Physical Therapy

## 2022-06-16 ENCOUNTER — Ambulatory Visit: Payer: Medicare Other | Admitting: Physical Therapy

## 2022-06-16 DIAGNOSIS — M6281 Muscle weakness (generalized): Secondary | ICD-10-CM | POA: Diagnosis not present

## 2022-06-16 DIAGNOSIS — M25612 Stiffness of left shoulder, not elsewhere classified: Secondary | ICD-10-CM | POA: Diagnosis not present

## 2022-06-16 DIAGNOSIS — M25512 Pain in left shoulder: Secondary | ICD-10-CM | POA: Diagnosis not present

## 2022-06-16 NOTE — Therapy (Signed)
OUTPATIENT PHYSICAL THERAPY SHOULDER TREATMENT  Patient Name: Lori Soto MRN: [id number] DOB:08-May-1935, 87 y.o., female Today's Date: 06/16/2022  END OF SESSION:  PT End of Session - 06/16/22 1128     Visit Number 7    Number of Visits 12    Date for PT Re-Evaluation 08/23/22    Authorization Type FOTO AT LEAST EVERY 5TH VISIT.  PROGRESS NOTE AT 10TH VISIT.  KX MODIFIER AFTER 15 VISITS.    PT Start Time 1116    PT Stop Time 1201    PT Time Calculation (min) 45 min    Activity Tolerance Patient tolerated treatment well    Behavior During Therapy WFL for tasks assessed/performed            Past Medical History:  Diagnosis Date   Anemia    Arthritis    Cancer ([place])    skin cancer on nose   Carpal tunnel syndrome, bilateral    Cataract    Esophagitis    GERD (gastroesophageal reflux disease)    H/O hiatal hernia    Osteopenia    Scoliosis    Shingles    SVT (supraventricular tachycardia)    Ulcerative colitis    Past Surgical History:  Procedure Laterality Date   ABDOMINAL HYSTERECTOMY     BUNIONECTOMY     Bilateral   CARPAL TUNNEL RELEASE Left 03/10/2015   Procedure: LEFT CARPAL TUNNEL RELEASE;  Surgeon: Daryll Brod, MD;  Location: [place];  Service: Orthopedics;  Laterality: Left;   CARPAL TUNNEL RELEASE Right 10/31/2017   Procedure: RIGHT CARPAL TUNNEL RELEASE;  Surgeon: Daryll Brod, MD;  Location: [place];  Service: Orthopedics;  Laterality: Right;   CATARACT EXTRACTION W/PHACO  05/28/2012   Procedure: CATARACT EXTRACTION PHACO AND INTRAOCULAR LENS PLACEMENT (IOC);  Surgeon: Tonny Branch, MD;  [facility];  Service: Ophthalmology;  Laterality: Right;  CDE=12.84   CATARACT EXTRACTION W/PHACO  06/07/2012   Procedure: CATARACT EXTRACTION PHACO AND INTRAOCULAR LENS PLACEMENT (IOC);  Surgeon: Tonny Branch, MD;  [facility];  Service: Ophthalmology;  Laterality: Left;  CDE 17.60   COLONOSCOPY     COLONOSCOPY N/A 07/08/2015    Procedure: COLONOSCOPY;  Surgeon: Rogene Houston, MD;  [facility];  Service: Endoscopy;  Laterality: N/A;  930   ESOPHAGEAL DILATION N/A 07/08/2015   Procedure: ESOPHAGEAL DILATION;  Surgeon: Rogene Houston, MD;  [facility];  Service: Endoscopy;  Laterality: N/A;   ESOPHAGOGASTRODUODENOSCOPY N/A 07/08/2015   Procedure: ESOPHAGOGASTRODUODENOSCOPY (EGD);  Surgeon: Rogene Houston, MD;  [facility];  Service: Endoscopy;  Laterality: N/A;   Hernia     Right inguinal   HERNIA REPAIR  1961   right inguinal hernia   UPPER GASTROINTESTINAL ENDOSCOPY     Patient Active Problem List   Diagnosis Date Noted   History of supraventricular tachycardia 11/08/2021   Cough 05/29/2020   Chest congestion 05/29/2020   Arthritis 06/13/2019   GERD (gastroesophageal reflux disease) 06/10/2019   Esophageal stricture 06/10/2019   Educated about COVID-19 virus infection 06/04/2019   SVT (supraventricular tachycardia) 08/28/2018   Elevated troponin 08/28/2018   Light headedness 05/09/2018   Carpal tunnel syndrome of right wrist 05/20/2015   Primary localized osteoarthrosis, hand 05/20/2015   Carpal tunnel syndrome on left 03/18/2015   Osteopenia of the elderly 02/19/2014   DDD (degenerative disc disease), lumbar 12/03/2013   Scoliosis 12/03/2013   Chronic LBP 05/14/2013   IBS (irritable bowel syndrome) 11/08/2012  UC (ulcerative colitis) ([place]) 04/23/2012   Palpitation 10/26/2011   Hypertension 10/26/2011   Chronic ulcerative colitis ([place]) 03/23/2011   REFERRING PROVIDER: Ronnie Doss DO  REFERRING DIAG: Chronic left shoulder pain.  THERAPY DIAG:  Acute pain of left shoulder  Stiffness of left shoulder, not elsewhere classified  Muscle weakness (generalized)  Rationale for Evaluation and Treatment: Rehabilitation  ONSET DATE: Several months.  SUBJECTIVE:                                                                                                                                                                                       SUBJECTIVE STATEMENT: Reports more pain in lateral R upper arm and teres major area. Didn't lift anything too heavy.  PERTINENT HISTORY: Chronic back pain.  PAIN:  Are you having pain? Yes: NPRS scale: 8/10 Pain location: Left shoulder Pain description: Ache, stiff and throbbing. Aggravating factors: Movement. Relieving factors: As above.  PRECAUTIONS: None  PATIENT GOALS:Use left shoulder without pain.  NEXT MD VISIT:   OBJECTIVE:   HEP:     [place] by Mali Applegate Jan 30th, 2024 View at [email or web address] using code: [email or web address]  Page 1 of 1  2 Exercises PECTORALIS CORNER STRETCH While standing at a corner of a wall, place your arms on the walls with elobws bent so that your upper arms are horizontal and your forearms are directed upwards as shown. Take one step forward towards the corner. Bend your front knee until a stretch is felt along the front of your chest and/or shoulders. Your arms should be pointed downward towards the ground. NOTE: Your legs should control the stretch by bending or straightening your front knee. Repeat 4 Times Hold 30 Seconds Complete 1 Set Perform 4 Times a Day Shoulder Flexion Wall Stretch Standing in front of the wall, walk your fingers up the wall until your arm is raised above your hand and you feel a slight stretch in the shoulder. Repeat 10 Times Hold 5 Seconds Complete 2 Sets Perform 4 Times a Day  TODAY'S TREATMENT:  DATE: 06/14/22:                                     EXERCISE LOG  Exercise Comments  UBE 120 RPM's x 10 minutes ( 5 mins for and 5 mins backward)         Manual Therapy Soft Tissue Mobilization: L posterior shoulder, to reduce tightness and pain     Modalities  Date:  Unattended Estim: Shoulder, IFC, 15 mins, Pain  ASSESSMENT:  CLINICAL IMPRESSION: Patient presented in clinic with reports of more posterior pain along triceps and lateral attachment to teres major region. Patient able to tolerate UBE but able to indicate soreness to triceps and lateral humeral region. Mild tightness palpable in affected musculature. Normal stimulation response noted following removal of the modality.  OBJECTIVE IMPAIRMENTS: decreased activity tolerance, decreased ROM, decreased strength, increased muscle spasms, and pain.   ACTIVITY LIMITATIONS: carrying, lifting, dressing, and reach over head  PARTICIPATION LIMITATIONS: meal prep, cleaning, laundry, and driving  REHAB POTENTIAL: Good  CLINICAL DECISION MAKING: Stable/uncomplicated  EVALUATION COMPLEXITY: Low  GOALS:  SHORT TERM GOALS: Target date: 06/08/22  Ind with an HEP. Goal status: MET  LONG TERM GOALS: Target date: 08/24/22  Ind with advanced HEP. Goal status: IN PROGRESS  2.  Active left shoulder flexion to 145 degrees so the patient can easily reach overhead. Goal status: IN PROGRESS  3.  Active ER to 70 degrees+ to allow for easily donning/doffing of apparel. Goal status: IN PROGRESS  4.  Increase left shoulder strength to a solid 4+/5 to increase stability for performance of functional activities. Goal status: IN PROGRESS  5.  Perform ADL's with pain not > 3/10. Goal status: IN PROGRESS  PLAN:  PT FREQUENCY: 2x/week  PT DURATION: 6 weeks  PLANNED INTERVENTIONS: Therapeutic exercises, Therapeutic activity, Patient/Family education, Self Care, Dry Needling, Electrical stimulation, Cryotherapy, Moist heat, Vasopneumatic device, Ultrasound, and Manual therapy  PLAN FOR NEXT SESSION: Combo e'stim/US, dry needling, RW4, PROM.  Standley Brooking, PTA 06/16/2022, 12:13 PM

## 2022-06-17 ENCOUNTER — Ambulatory Visit (INDEPENDENT_AMBULATORY_CARE_PROVIDER_SITE_OTHER): Payer: Medicare Other

## 2022-06-17 VITALS — Ht 60.0 in | Wt 121.0 lb

## 2022-06-17 DIAGNOSIS — Z0000 Encounter for general adult medical examination without abnormal findings: Secondary | ICD-10-CM

## 2022-06-17 DIAGNOSIS — Z780 Asymptomatic menopausal state: Secondary | ICD-10-CM | POA: Diagnosis not present

## 2022-06-17 NOTE — Progress Notes (Signed)
Subjective:   Lori Soto is a 87 y.o. female who presents for Medicare Annual (Subsequent) preventive examination. I connected with  Aleshea Giang on 06/17/22 by a audio enabled telemedicine application and verified that I am speaking with the correct person using two identifiers.  Patient Location: Home  Provider Location: Home Office  I discussed the limitations of evaluation and management by telemedicine. The patient expressed understanding and agreed to proceed.  Review of Systems     Cardiac Risk Factors include: advanced age (>70mn, >>72women)     Objective:    Today's Vitals   06/17/22 1320  Weight: 121 lb (54.9 kg)  Height: 5' (1.524 m)   Body mass index is 23.63 kg/m.     06/17/2022    1:25 PM 05/25/2022   11:44 AM 10/05/2021   12:18 PM 06/16/2021    2:09 PM 05/20/2021    4:38 PM 12/17/2020    2:10 PM 06/15/2020    2:14 PM  Advanced Directives  Does Patient Have a Medical Advance Directive? Yes Yes No No Yes No;Yes Yes  Type of AParamedicof ASanbornLiving will     Living will HNoonanLiving will  Does patient want to make changes to medical advance directive?      No - Patient declined No - Patient declined  Copy of [place] Chart? No - copy requested        Would patient like information on creating a medical advance directive?   No - Patient declined No - Patient declined  No - Patient declined     Current Medications (verified) Outpatient Encounter Medications as of 06/17/2022  Medication Sig   acetaminophen (TYLENOL) 650 MG CR tablet Take 650 mg by mouth every 8 (eight) hours as needed. Patient states that she takes prn for arthritis in her knees.   Bromelains 500 MG TABS Take 500 mg by mouth daily.   Coenzyme Q10 (CO Q-10 PO) Take 1 tablet by mouth daily. With red yeast rice [employer]   COLLAGEN PO Take 1 tablet by mouth daily. This is known as 1-2-3   Cyanocobalamin  (VITAMIN B 12 PO) Take 1,000 mcg by mouth daily.   Ginkgo Biloba 40 MG TABS Take 40 mg by mouth daily.   glucosamine-chondroitin 500-400 MG tablet Take 1 tablet by mouth 2 (two) times daily.   Hyaluronic Acid-Vitamin C (HYALURONIC ACID PO) Take 80 mg by mouth daily.   Mesalamine (ASACOL) 400 MG CPDR DR capsule TAKE 2 CAPSULES DAILY   metoprolol tartrate (LOPRESSOR) 25 MG tablet TAKE 1 TABLET TWICE A DAY   MULTIPLE VITAMINS PO Take 1 tablet by mouth daily. In the mornings   Nutritional Supplements (GRAPESEED EXTRACT PO) Take 60 mg by mouth daily.    OAT BRAN SOLUBLE PO Take 1 tablet by mouth daily.   OVER THE COUNTER MEDICATION Take 66 mg by mouth daily. Vision Gold Lutein   OVER THE COUNTER MEDICATION Take 250 mg by mouth daily. Alphalipoic Acid 250 mg daily   pantoprazole (PROTONIX) 20 MG tablet TAKE 1 TABLET BY MOUTH DAILY  BEFORE BREAKFAST   pyridOXINE (VITAMIN B-6) 100 MG tablet Take 100 mg by mouth daily.   Turmeric 500 MG CAPS Take 1 capsule by mouth 3 (three) times daily.    Vitamins C E (CRANBERRY CONCENTRATE PO) Take 2,500 mg by mouth daily.   Zinc 50 MG CAPS Take 50 mg by mouth daily.  No facility-administered encounter medications on file as of 06/17/2022.    Allergies (verified) Penicillins, Influenza virus vacc split pf, and Tape   History: Past Medical History:  Diagnosis Date   Anemia    Arthritis    Cancer ([place])    skin cancer on nose   Carpal tunnel syndrome, bilateral    Cataract    Esophagitis    GERD (gastroesophageal reflux disease)    H/O hiatal hernia    Osteopenia    Scoliosis    Shingles    SVT (supraventricular tachycardia)    Ulcerative colitis    Past Surgical History:  Procedure Laterality Date   ABDOMINAL HYSTERECTOMY     BUNIONECTOMY     Bilateral   CARPAL TUNNEL RELEASE Left 03/10/2015   Procedure: LEFT CARPAL TUNNEL RELEASE;  Surgeon: Daryll Brod, MD;  Location: [facility];  Service: Orthopedics;  Laterality: Left;   CARPAL  TUNNEL RELEASE Right 10/31/2017   Procedure: RIGHT CARPAL TUNNEL RELEASE;  Surgeon: Daryll Brod, MD;  Location: [facility];  Service: Orthopedics;  Laterality: Right;   CATARACT EXTRACTION W/PHACO  05/28/2012   Procedure: CATARACT EXTRACTION PHACO AND INTRAOCULAR LENS PLACEMENT (IOC);  Surgeon: Tonny Branch, MD;  Location: [facility];  Service: Ophthalmology;  Laterality: Right;  CDE=12.84   CATARACT EXTRACTION W/PHACO  06/07/2012   Procedure: CATARACT EXTRACTION PHACO AND INTRAOCULAR LENS PLACEMENT (IOC);  Surgeon: Tonny Branch, MD;  Location: [facility];  Service: Ophthalmology;  Laterality: Left;  CDE 17.60   COLONOSCOPY     COLONOSCOPY N/A 07/08/2015   Procedure: COLONOSCOPY;  Surgeon: Rogene Houston, MD;  [facility];  Service: Endoscopy;  Laterality: N/A;  930   ESOPHAGEAL DILATION N/A 07/08/2015   Procedure: ESOPHAGEAL DILATION;  Surgeon: Rogene Houston, MD;  [facility];  Service: Endoscopy;  Laterality: N/A;   ESOPHAGOGASTRODUODENOSCOPY N/A 07/08/2015   Procedure: ESOPHAGOGASTRODUODENOSCOPY (EGD);  Surgeon: Rogene Houston, MD;  [facility];  Service: Endoscopy;  Laterality: N/A;   Hernia     Right inguinal   HERNIA REPAIR  1961   right inguinal hernia   UPPER GASTROINTESTINAL ENDOSCOPY     Family History  Problem Relation Age of Onset   Arthritis Mother    Heart disease Mother    Cancer Father        pancreat   Pancreatic cancer Father    Arthritis Father        back issues    Cancer Brother        lung and liver   Liver cancer Brother    Lung cancer Brother    Heart disease Sister        Rheumatic Fever   Peptic Ulcer Sister    Diabetes Sister    Colon cancer Sister    Edema Sister    GI problems Sister        diverticulitis   Diabetes Sister    Neuropathy Sister    COPD Sister    Hiatal hernia Sister    GI problems Sister    Social History   Socioeconomic History   Marital status: Widowed    Spouse name: Not on file   Number  of children: 3   Years of education: Not on file   Highest education level: Bachelor's degree (e.g., BA, AB, BS)  Occupational History    Employer: RETIRED    Comment: accounting  Tobacco Use   Smoking status: Never   Smokeless tobacco:  Never   Tobacco comments:    Never smoker  Vaping Use   Vaping Use: Never used  Substance and Sexual Activity   Alcohol use: Yes    Alcohol/week: 0.0 standard drinks of alcohol    Comment: Very Rarely   Drug use: No   Sexual activity: Not Currently    Birth control/protection: Surgical  Other Topics Concern   Not on file  Social History Narrative   Lives alone in 2 story home. Still very independent   Social Determinants of Health   Financial Resource Strain: Low Risk  (06/17/2022)   Overall Financial Resource Strain (CARDIA)    Difficulty of Paying Living Expenses: Not hard at all  Food Insecurity: No Food Insecurity (06/17/2022)   Hunger Vital Sign    Worried About Running Out of Food in the Last Year: Never true    Ran Out of Food in the Last Year: Never true  Transportation Needs: No Transportation Needs (06/17/2022)   PRAPARE - Hydrologist (Medical): No    Lack of Transportation (Non-Medical): No  Physical Activity: Insufficiently Active (06/17/2022)   Exercise Vital Sign    Days of Exercise per Week: 3 days    Minutes of Exercise per Session: 30 min  Stress: No Stress Concern Present (06/17/2022)   [place]    Feeling of Stress : Not at all  Social Connections: Moderately Integrated (06/17/2022)   Social Connection and Isolation Panel [NHANES]    Frequency of Communication with Friends and Family: More than three times a week    Frequency of Social Gatherings with Friends and Family: More than three times a week    Attends Religious Services: More than 4 times per year    Active Member of [employer] or Organizations: Yes    Attends Theatre manager Meetings: More than 4 times per year    Marital Status: Widowed    Tobacco Counseling Counseling given: Not Answered Tobacco comments: Never smoker   Clinical Intake:  Pre-visit preparation completed: Yes  Pain : No/denies pain     Nutritional Risks: None Diabetes: No  How often do you need to have someone help you when you read instructions, pamphlets, or other written materials from your doctor or pharmacy?: 1 - Never  Diabetic?no   Interpreter Needed?: No  Information entered by :: Jadene Pierini, LPN   Activities of Daily Living    06/17/2022    1:25 PM  In your present state of health, do you have any difficulty performing the following activities:  Hearing? 0  Vision? 0  Difficulty concentrating or making decisions? 0  Walking or climbing stairs? 0  Dressing or bathing? 0  Doing errands, shopping? 0  Preparing Food and eating ? N  Using the Toilet? N  In the past six months, have you accidently leaked urine? N  Do you have problems with loss of bowel control? N  Managing your Medications? N  Managing your Finances? N  Housekeeping or managing your Housekeeping? N    Patient Care Team: [name], DO as PCP - General (Family Medicine) Minus Breeding, MD as PCP - Cardiology (Cardiology) Rogene Houston, MD as Consulting Physician (Gastroenterology) Gaynelle Arabian, MD as Consulting Physician (Orthopedic Surgery) Daryll Brod, MD as Consulting Physician (Orthopedic Surgery) Particia Nearing, OD (Optometry)  Indicate any recent Medical Services you may have received from other than [facility] providers in the past year (date may  be approximate).     Assessment:   This is a routine wellness examination for Lori Soto.  Hearing/Vision screen Vision Screening - Comments:: Wears rx glasses - up to date with routine eye exams with  Dr.Davis   Dietary issues and exercise activities discussed: Current Exercise Habits: Home exercise routine, Type  of exercise: walking, Time (Minutes): 30, Frequency (Times/Week): 3, Weekly Exercise (Minutes/Week): 90, Intensity: Mild, Exercise limited by: None identified   Goals Addressed             This Visit's Progress    awv   On track    06/15/2020 AWV Goal: Fall Prevention  Over the next year, patient will decrease their risk for falls by: Using assistive devices, such as a cane or walker, as needed Identifying fall risks within their home and correcting them by: Removing throw rugs Adding handrails to stairs or ramps Removing clutter and keeping a clear pathway throughout the home Increasing light, especially at night Adding shower handles/bars Raising toilet seat Identifying potential personal risk factors for falls: Medication side effects Incontinence/urgency Vestibular dysfunction Hearing loss Musculoskeletal disorders Neurological disorders Orthostatic hypotension         Depression Screen    06/17/2022    1:22 PM 05/17/2022    3:45 PM 11/08/2021   10:15 AM 06/16/2021    2:07 PM 05/11/2021   11:16 AM 11/09/2020    3:11 PM 07/03/2020   11:28 AM  PHQ 2/9 Scores  PHQ - 2 Score 0 0 0 0 0 1 0  PHQ- 9 Score    0 0 2 0    Fall Risk    06/17/2022    1:21 PM 05/17/2022    3:45 PM 11/08/2021   10:15 AM 06/16/2021    2:04 PM 05/11/2021   11:17 AM  Fall Risk   Falls in the past year? 0 0 0 0 0  Number falls in past yr: 0   0   Injury with Fall? 0   0   Risk for fall due to : No Fall Risks   Orthopedic patient   Follow up Falls prevention discussed   Falls prevention discussed     [place]:  Any stairs in or around the home? Yes  If so, are there any without handrails? No  Home free of loose throw rugs in walkways, pet beds, electrical cords, etc? Yes  Adequate lighting in your home to reduce risk of falls? Yes   ASSISTIVE DEVICES UTILIZED TO PREVENT FALLS:  Life alert? No  Use of a cane, walker or w/c? No  Grab bars in the  bathroom? No  Shower chair or bench in shower? Yes  Elevated toilet seat or a handicapped toilet? Yes       11/21/2017   10:26 AM 11/17/2016    2:54 PM 11/16/2015    3:31 PM 01/23/2015   10:42 AM  MMSE - Mini Mental State Exam  Orientation to time 5 5 5 5  $ Orientation to Place 5 5 5 5  $ Registration 3 3 3 3  $ Attention/ Calculation 5 5 5 4  $ Recall 3 3 3 3  $ Language- name 2 objects 2 2 2 2  $ Language- repeat 1 1 1 1  $ Language- follow 3 step command 3 3 3 2  $ Language- read & follow direction 1 1 1 1  $ Write a sentence 1 1 1 1  $ Copy design 1 1 1 1  $ Total score 30 30 30 $ 28  06/17/2022    1:25 PM 06/15/2020    2:31 PM 06/13/2019    2:50 PM  6CIT Screen  What Year? 0 points 0 points 0 points  What month? 0 points 0 points 0 points  What time? 0 points 0 points 0 points  Count back from 20 0 points 0 points 0 points  Months in reverse 0 points 0 points 0 points  Repeat phrase 0 points 2 points 0 points  Total Score 0 points 2 points 0 points    Immunizations Immunization History  Administered Date(s) Administered   Pneumococcal Conjugate-13 02/26/2014   Pneumococcal Polysaccharide-23 03/20/2012   Td 01/23/2015   Tdap 01/23/2015    TDAP status: Up to date  Flu Vaccine status: Declined, Education has been provided regarding the importance of this vaccine but patient still declined. Advised may receive this vaccine at local pharmacy or Health Dept. Aware to provide a copy of the vaccination record if obtained from local pharmacy or Health Dept. Verbalized acceptance and understanding.  Pneumococcal vaccine status: Up to date  Covid-19 vaccine status: Completed vaccines  Qualifies for Shingles Vaccine? Yes   Zostavax completed No   Shingrix Completed?: No.    Education has been provided regarding the importance of this vaccine. Patient has been advised to call insurance company to determine out of pocket expense if they have not yet received this vaccine. Advised may also  receive vaccine at local pharmacy or Health Dept. Verbalized acceptance and understanding.  Screening Tests Health Maintenance  Topic Date Due   Zoster Vaccines- Shingrix (1 of 2) 08/15/2022 (Originally 12/13/1954)   DEXA SCAN  07/04/2022   Medicare Annual Wellness (AWV)  06/18/2023   DTaP/Tdap/Td (3 - Td or Tdap) 01/22/2025   Pneumonia Vaccine 67+ Years old  Completed   HPV VACCINES  Aged Out   INFLUENZA VACCINE  Discontinued   COVID-19 Vaccine  Discontinued    Health Maintenance  There are no preventive care reminders to display for this patient.   Colorectal cancer screening: No longer required.   Mammogram status: No longer required due to age.  Bone Density status: Completed 07/03/2020. Results reflect: Bone density results: OSTEOPOROSIS. Repeat every 2 years.  Lung Cancer Screening: (Low Dose CT Chest recommended if Age 80-80 years, 30 pack-year currently smoking OR have quit w/in 15years.) does not qualify.   Lung Cancer Screening Referral: n/a  Additional Screening:  Hepatitis C Screening: does not qualify;   Vision Screening: Recommended annual ophthalmology exams for early detection of glaucoma and other disorders of the eye. Is the patient up to date with their annual eye exam?  Yes  Who is the provider or what is the name of the office in which the patient attends annual eye exams? Dr.Davis  If pt is not established with a provider, would they like to be referred to a provider to establish care? No .   Dental Screening: Recommended annual dental exams for proper oral hygiene  Community Resource Referral / Chronic Care Management: CRR required this visit?  No   CCM required this visit?  No      Plan:     I have personally reviewed and noted the following in the patient's chart:   Medical and social history Use of alcohol, tobacco or illicit drugs  Current medications and supplements including opioid prescriptions. Patient is not currently taking opioid  prescriptions. Functional ability and status Nutritional status Physical activity Advanced directives List of other physicians Hospitalizations, surgeries, and ER visits in  previous 12 months Vitals Screenings to include cognitive, depression, and falls Referrals and appointments  In addition, I have reviewed and discussed with patient certain preventive protocols, quality metrics, and best practice recommendations. A written personalized care plan for preventive services as well as general preventive health recommendations were provided to patient.     Daphane Shepherd, LPN   [id number]   Nurse Notes: Referral DEXA 06/17/2022

## 2022-06-17 NOTE — Patient Instructions (Signed)
Lori Soto , Thank you for taking time to come for your Medicare Wellness Visit. I appreciate your ongoing commitment to your health goals. Please review the following plan we discussed and let me know if I can assist you in the future.   These are the goals we discussed:  Goals       awv      06/15/2020 AWV Goal: Fall Prevention  Over the next year, patient will decrease their risk for falls by: Using assistive devices, such as a cane or walker, as needed Identifying fall risks within their home and correcting them by: Removing throw rugs Adding handrails to stairs or ramps Removing clutter and keeping a clear pathway throughout the home Increasing light, especially at night Adding shower handles/bars Raising toilet seat Identifying potential personal risk factors for falls: Medication side effects Incontinence/urgency Vestibular dysfunction Hearing loss Musculoskeletal disorders Neurological disorders Orthostatic hypotension        Chronic Disease Management Needs (pt-stated)      Current Barriers:  Chronic Disease Management support, education, and care coordination needs related to HTN, SVT, Arthritis, chronic ulcerative colitis, osteopenia  Clinical Goal(s) related to HTN, SVT, Arthritis, chronic ulcerative colitis, osteopenia:  Over the next 60 days, patient will:  Work with the care management team to address educational, disease management, and care coordination needs  Begin or continue self health monitoring activities as directed today  measure and record blood pressure weekly and PRN Call provider office for new or worsened signs and symptoms New or worsened symptom related to SVT Call care management team with questions or concerns Verbalize basic understanding of patient centered plan of care established today  Interventions related to HTN, SVT, Arthritis, chronic ulcerative colitis, osteopenia:  Evaluation of current treatment plans and patient's adherence to  plan as established by provider Assessed patient understanding of disease states Assessed patient's education and care coordination needs Provided disease specific education to patient  Collaborated with appropriate clinical care team members regarding patient needs  Patient Self Care Activities related to HTN, SVT, Arthritis, chronic ulcerative colitis, osteopenia:  Patient is unable to independently self-manage chronic health conditions  Initial goal documentation       Patient Stated      06/2021 - Would like to finish cleaning out her closet Get help cleaning out her basement (recently flooded)      Prevent falls      Current Barriers:  Knowledge Deficits related to fall precautions Decreased adherence to prescribed treatment for fall prevention  Nurse Case Manager Clinical Goal(s):  Over the next 60 days, patient will demonstrate improved adherence to prescribed treatment plan for decreasing falls as evidenced by patient reporting and review of EMR Over the next 60 days, patient will verbalize using fall risk reduction strategies discussed Over the next 90 days, patient will not experience additional falls  Interventions:  Provided written and verbal education re: Potential causes of falls and Fall prevention strategies Reviewed medications and discussed potential side effects of medications such as dizziness and frequent urination Assessed for s/s of orthostatic hypotension Assessed for falls since last encounter. Assessed patients knowledge of fall risk prevention secondary to previously provided education.  Patient Self Care Activities:  Use handrails on stairs De-clutter walkways Change positions slowly Wear secure fitting shoes at all times with ambulation Utilize home lighting for dim lit areas Have self and pet awareness at all times  Plan: CCM RN CM will follow up in 60   Initial goal documentation  Schedule Bone Density Scan (pt-stated)      Current  Barriers:  Has not been contacted to schedule bone density scan Unsure of procedure for scheduling bone density scan  Nurse Case Manager Clinical Goal(s):  Over the next 14 days, patient will work with scheduling staff to schedule dexa scan at [facility]  Interventions:  Chart reviewed Last dexa scan was in 2015 Discussed osteopenia diagnosis Discussed scoliosis and her physical limitations with lying flat Has had to do wrist scan in the past because of inability to lay flat Collaboration with [facility] staff to order dexa scan at [facility] Asked patient to let me know if she has not heard from scheduling over the next 2 weeks  Patient Self Care Activities:  Performs ADL's independently Performs IADL's independently  Initial goal documentation       Schedule Follow-up with PCP (pt-stated)      Routine in-office visit with PCP and lab tests needed in patient with hypertension and SVT.  Current Barriers:  Appointment with PCP has not been scheduled since Dr Laurance Flatten retired  Nurse Case Manager Clinical Goal(s):  Over the next 14 days, patient will work with [facility] staff to schedule a face-to-face visit with Dr Lajuana Ripple   Interventions:  Discussed need for face-to-face visit and labs with patient Reviewed previous lab results and discussed previously abnormal potassium levels Collaboration with [place] office staff to schedule appointment Advised patient to let me know if she hasn't received a call within the next 2 weeks Provided with RN CCM contact information  Patient Self Care Activities:  Performs ADL's independently Performs IADL's independently  Initial goal documentation       Scoliosis Management      Current Barriers:  Chronic Disease Management support and education needs related to scoliosis  Nurse Case Manager Clinical Goal(s):  Over the next 90 days, patient will continue to follow maintenance plan for controlling scoliosis pain and  curvature  Interventions:  Evaluation of current treatment plan related to scoliosis and patient's adherence to plan as established by provider. Reviewed medications with patient and discussed patient's non-pharmacological strategies for managing pain. She does not want to take pain medication.  Just completed sessions of traction that help with curvature. She feels that it needs to be twice weekly forever to really help maintain position. Insurance will only cover a limited number of sessions each year. Had spinal injection last year that has helped. Can tell that it is wearing off now.  Discussed plans with patient for ongoing care management follow up and provided patient with direct contact information for care management team  Patient Self Care Activities:  Performs ADL's independently Performs IADL's independently   Initial goal documentation       SVT Management      Current Barriers:  Chronic Disease Management support and education needs related to SVT  Nurse Case Manager Clinical Goal(s):  Over the next 90 days, patient will not experience any SVT episodes  Interventions:  Evaluation of current treatment plan related to SVT and patient's adherence to plan as established by provider. Advised patient to reach out to Dr Percival Spanish with any new or worsening symptoms Reviewed medications with patient and discussed Metoprolol 11m and how she takes it Discussed plans with patient for ongoing care management follow up and provided patient with direct contact information for care management team  Patient Self Care Activities:  Performs ADL's independently Performs IADL's independently  Initial goal documentation  This is a list of the screening recommended for you and due dates:  Health Maintenance  Topic Date Due   Zoster (Shingles) Vaccine (1 of 2) 08/15/2022*   DEXA scan (bone density measurement)  07/04/2022   Medicare Annual Wellness Visit  06/18/2023    DTaP/Tdap/Td vaccine (3 - Td or Tdap) 01/22/2025   Pneumonia Vaccine  Completed   HPV Vaccine  Aged Out   Flu Shot  Discontinued   COVID-19 Vaccine  Discontinued  *Topic was postponed. The date shown is not the original due date.    Advanced directives: Advance directive discussed with you today. I have provided a copy for you to complete at home and have notarized. Once this is complete please bring a copy in to our office so we can scan it into your chart.   Conditions/risks identified: Aim for 30 minutes of exercise or brisk walking, 6-8 glasses of water, and 5 servings of fruits and vegetables each day.   Next appointment: Follow up in one year for your annual wellness visit    Preventive Care 65 Years and Older, Female Preventive care refers to lifestyle choices and visits with your health care provider that can promote health and wellness. What does preventive care include? A yearly physical exam. This is also called an annual well check. Dental exams once or twice a year. Routine eye exams. Ask your health care provider how often you should have your eyes checked. Personal lifestyle choices, including: Daily care of your teeth and gums. Regular physical activity. Eating a healthy diet. Avoiding tobacco and drug use. Limiting alcohol use. Practicing safe sex. Taking low-dose aspirin every day. Taking vitamin and mineral supplements as recommended by your health care provider. What happens during an annual well check? The services and screenings done by your health care provider during your annual well check will depend on your age, overall health, lifestyle risk factors, and family history of disease. Counseling  Your health care provider may ask you questions about your: Alcohol use. Tobacco use. Drug use. Emotional well-being. Home and relationship well-being. Sexual activity. Eating habits. History of falls. Memory and ability to understand (cognition). Work and  work Statistician. Reproductive health. Screening  You may have the following tests or measurements: Height, weight, and BMI. Blood pressure. Lipid and cholesterol levels. These may be checked every 5 years, or more frequently if you are over 60 years old. Skin check. Lung cancer screening. You may have this screening every year starting at age 51 if you have a 30-pack-year history of smoking and currently smoke or have quit within the past 15 years. Fecal occult blood test (FOBT) of the stool. You may have this test every year starting at age 56. Flexible sigmoidoscopy or colonoscopy. You may have a sigmoidoscopy every 5 years or a colonoscopy every 10 years starting at age 26. Hepatitis C blood test. Hepatitis B blood test. Sexually transmitted disease (STD) testing. Diabetes screening. This is done by checking your blood sugar (glucose) after you have not eaten for a while (fasting). You may have this done every 1-3 years. Bone density scan. This is done to screen for osteoporosis. You may have this done starting at age 69. Mammogram. This may be done every 1-2 years. Talk to your health care provider about how often you should have regular mammograms. Talk with your health care provider about your test results, treatment options, and if necessary, the need for more tests. Vaccines  Your health care provider may recommend certain  vaccines, such as: Influenza vaccine. This is recommended every year. Tetanus, diphtheria, and acellular pertussis (Tdap, Td) vaccine. You may need a Td booster every 10 years. Zoster vaccine. You may need this after age 47. Pneumococcal 13-valent conjugate (PCV13) vaccine. One dose is recommended after age 27. Pneumococcal polysaccharide (PPSV23) vaccine. One dose is recommended after age 75. Talk to your health care provider about which screenings and vaccines you need and how often you need them. This information is not intended to replace advice given to you  by your health care provider. Make sure you discuss any questions you have with your health care provider. Document Released: 05/15/2015 Document Revised: 01/06/2016 Document Reviewed: 02/17/2015 Elsevier Interactive Patient Education  2017 [employer] Prevention in the Home Falls can cause injuries. They can happen to people of all ages. There are many things you can do to make your home safe and to help prevent falls. What can I do on the outside of my home? Regularly fix the edges of walkways and driveways and fix any cracks. Remove anything that might make you trip as you walk through a door, such as a raised step or threshold. Trim any bushes or trees on the path to your home. Use bright outdoor lighting. Clear any walking paths of anything that might make someone trip, such as rocks or tools. Regularly check to see if handrails are loose or broken. Make sure that both sides of any steps have handrails. Any raised decks and porches should have guardrails on the edges. Have any leaves, snow, or ice cleared regularly. Use sand or salt on walking paths during winter. Clean up any spills in your garage right away. This includes oil or grease spills. What can I do in the bathroom? Use night lights. Install grab bars by the toilet and in the tub and shower. Do not use towel bars as grab bars. Use non-skid mats or decals in the tub or shower. If you need to sit down in the shower, use a plastic, non-slip stool. Keep the floor dry. Clean up any water that spills on the floor as soon as it happens. Remove soap buildup in the tub or shower regularly. Attach bath mats securely with double-sided non-slip rug tape. Do not have throw rugs and other things on the floor that can make you trip. What can I do in the bedroom? Use night lights. Make sure that you have a light by your bed that is easy to reach. Do not use any sheets or blankets that are too big for your bed. They should not  hang down onto the floor. Have a firm chair that has side arms. You can use this for support while you get dressed. Do not have throw rugs and other things on the floor that can make you trip. What can I do in the kitchen? Clean up any spills right away. Avoid walking on wet floors. Keep items that you use a lot in easy-to-reach places. If you need to reach something above you, use a strong step stool that has a grab bar. Keep electrical cords out of the way. Do not use floor polish or wax that makes floors slippery. If you must use wax, use non-skid floor wax. Do not have throw rugs and other things on the floor that can make you trip. What can I do with my stairs? Do not leave any items on the stairs. Make sure that there are handrails on both sides of the stairs  and use them. Fix handrails that are broken or loose. Make sure that handrails are as long as the stairways. Check any carpeting to make sure that it is firmly attached to the stairs. Fix any carpet that is loose or worn. Avoid having throw rugs at the top or bottom of the stairs. If you do have throw rugs, attach them to the floor with carpet tape. Make sure that you have a light switch at the top of the stairs and the bottom of the stairs. If you do not have them, ask someone to add them for you. What else can I do to help prevent falls? Wear shoes that: Do not have high heels. Have rubber bottoms. Are comfortable and fit you well. Are closed at the toe. Do not wear sandals. If you use a stepladder: Make sure that it is fully opened. Do not climb a closed stepladder. Make sure that both sides of the stepladder are locked into place. Ask someone to hold it for you, if possible. Clearly mark and make sure that you can see: Any grab bars or handrails. First and last steps. Where the edge of each step is. Use tools that help you move around (mobility aids) if they are needed. These  include: Canes. Walkers. Scooters. Crutches. Turn on the lights when you go into a dark area. Replace any light bulbs as soon as they burn out. Set up your furniture so you have a clear path. Avoid moving your furniture around. If any of your floors are uneven, fix them. If there are any pets around you, be aware of where they are. Review your medicines with your doctor. Some medicines can make you feel dizzy. This can increase your chance of falling. Ask your doctor what other things that you can do to help prevent falls. This information is not intended to replace advice given to you by your health care provider. Make sure you discuss any questions you have with your health care provider. Document Released: 02/12/2009 Document Revised: 09/24/2015 Document Reviewed: 05/23/2014 Elsevier Interactive Patient Education  2017 [employer].

## 2022-06-21 ENCOUNTER — Ambulatory Visit: Payer: Medicare Other | Admitting: Physical Therapy

## 2022-06-21 DIAGNOSIS — H353111 Nonexudative age-related macular degeneration, right eye, early dry stage: Secondary | ICD-10-CM | POA: Diagnosis not present

## 2022-06-21 DIAGNOSIS — M25612 Stiffness of left shoulder, not elsewhere classified: Secondary | ICD-10-CM | POA: Diagnosis not present

## 2022-06-21 DIAGNOSIS — M6281 Muscle weakness (generalized): Secondary | ICD-10-CM | POA: Diagnosis not present

## 2022-06-21 DIAGNOSIS — M25512 Pain in left shoulder: Secondary | ICD-10-CM

## 2022-06-21 NOTE — Therapy (Signed)
OUTPATIENT PHYSICAL THERAPY SHOULDER TREATMENT  Patient Name: Lori Soto MRN: [id number] DOB:1936-03-06, 87 y.o., female Today's Date: 06/21/2022  END OF SESSION:  PT End of Session - 06/21/22 1656     Visit Number 8    Number of Visits 12    Date for PT Re-Evaluation 08/23/22    Authorization Type FOTO AT LEAST EVERY 5TH VISIT.  PROGRESS NOTE AT 10TH VISIT.  KX MODIFIER AFTER 15 VISITS.    PT Start Time 0315    Activity Tolerance Patient tolerated treatment well    Behavior During Therapy WFL for tasks assessed/performed            Past Medical History:  Diagnosis Date   Anemia    Arthritis    Cancer ([place])    skin cancer on nose   Carpal tunnel syndrome, bilateral    Cataract    Esophagitis    GERD (gastroesophageal reflux disease)    H/O hiatal hernia    Osteopenia    Scoliosis    Shingles    SVT (supraventricular tachycardia)    Ulcerative colitis    Past Surgical History:  Procedure Laterality Date   ABDOMINAL HYSTERECTOMY     BUNIONECTOMY     Bilateral   CARPAL TUNNEL RELEASE Left 03/10/2015   Procedure: LEFT CARPAL TUNNEL RELEASE;  Surgeon: Daryll Brod, MD;  Location: [place];  Service: Orthopedics;  Laterality: Left;   CARPAL TUNNEL RELEASE Right 10/31/2017   Procedure: RIGHT CARPAL TUNNEL RELEASE;  Surgeon: Daryll Brod, MD;  Location: [place];  Service: Orthopedics;  Laterality: Right;   CATARACT EXTRACTION W/PHACO  05/28/2012   Procedure: CATARACT EXTRACTION PHACO AND INTRAOCULAR LENS PLACEMENT (IOC);  Surgeon: Tonny Branch, MD;  Location: [facility];  Service: Ophthalmology;  Laterality: Right;  CDE=12.84   CATARACT EXTRACTION W/PHACO  06/07/2012   Procedure: CATARACT EXTRACTION PHACO AND INTRAOCULAR LENS PLACEMENT (IOC);  Surgeon: Tonny Branch, MD;  Location: [facility];  Service: Ophthalmology;  Laterality: Left;  CDE 17.60   COLONOSCOPY     COLONOSCOPY N/A 07/08/2015   Procedure: COLONOSCOPY;  Surgeon: Rogene Houston, MD;   [facility];  Service: Endoscopy;  Laterality: N/A;  930   ESOPHAGEAL DILATION N/A 07/08/2015   Procedure: ESOPHAGEAL DILATION;  Surgeon: Rogene Houston, MD;  [facility];  Service: Endoscopy;  Laterality: N/A;   ESOPHAGOGASTRODUODENOSCOPY N/A 07/08/2015   Procedure: ESOPHAGOGASTRODUODENOSCOPY (EGD);  Surgeon: Rogene Houston, MD;  [facility];  Service: Endoscopy;  Laterality: N/A;   Hernia     Right inguinal   HERNIA REPAIR  1961   right inguinal hernia   UPPER GASTROINTESTINAL ENDOSCOPY     Patient Active Problem List   Diagnosis Date Noted   History of supraventricular tachycardia 11/08/2021   Cough 05/29/2020   Chest congestion 05/29/2020   Arthritis 06/13/2019   GERD (gastroesophageal reflux disease) 06/10/2019   Esophageal stricture 06/10/2019   Educated about COVID-19 virus infection 06/04/2019   SVT (supraventricular tachycardia) 08/28/2018   Elevated troponin 08/28/2018   Light headedness 05/09/2018   Carpal tunnel syndrome of right wrist 05/20/2015   Primary localized osteoarthrosis, hand 05/20/2015   Carpal tunnel syndrome on left 03/18/2015   Osteopenia of the elderly 02/19/2014   DDD (degenerative disc disease), lumbar 12/03/2013   Scoliosis 12/03/2013   Chronic LBP 05/14/2013   IBS (irritable bowel syndrome) 11/08/2012   UC (ulcerative colitis) ([place]) 04/23/2012   Palpitation 10/26/2011   Hypertension 10/26/2011  Chronic ulcerative colitis ([place]) 03/23/2011   REFERRING PROVIDER: Ronnie Doss DO  REFERRING DIAG: Chronic left shoulder pain.  THERAPY DIAG:  Acute pain of left shoulder  Stiffness of left shoulder, not elsewhere classified  Rationale for Evaluation and Treatment: Rehabilitation  ONSET DATE: Several months.  SUBJECTIVE:                                                                                                                                                                                       SUBJECTIVE STATEMENT: Pain low today but haven't done much. PERTINENT HISTORY: Chronic back pain.  PAIN:  Are you having pain? Yes: NPRS scale: 2-3/10 Pain location: Left shoulder Pain description: Ache, stiff and throbbing. Aggravating factors: Movement. Relieving factors: As above.  PRECAUTIONS: None  PATIENT GOALS:Use left shoulder without pain.  NEXT MD VISIT:   OBJECTIVE:   HEP:     [place] by [place] Stoy Fenn Jan 30th, 2024 View at [email or web address] using code: [email or web address]  Page 1 of 1  2 Exercises PECTORALIS CORNER STRETCH While standing at a corner of a wall, place your arms on the walls with elobws bent so that your upper arms are horizontal and your forearms are directed upwards as shown. Take one step forward towards the corner. Bend your front knee until a stretch is felt along the front of your chest and/or shoulders. Your arms should be pointed downward towards the ground. NOTE: Your legs should control the stretch by bending or straightening your front knee. Repeat 4 Times Hold 30 Seconds Complete 1 Set Perform 4 Times a Day Shoulder Flexion Wall Stretch Standing in front of the wall, walk your fingers up the wall until your arm is raised above your hand and you feel a slight stretch in the shoulder. Repeat 10 Times Hold 5 Seconds Complete 2 Sets Perform 4 Times a Day  TODAY'S TREATMENT:  DATE: 06/21/22:                                     EXERCISE LOG  Exercise Comments  UBE 120 RPM's x 8 minutes ( 5 mins for and 5 mins backward)        Combo e'stim/US (small soundhead) at 1.50 W/CM2 x 10 minutes to patient's left posterior cuff region f/b STW/M including ischemic release technique to decrease tone x 10 minutes f/b IFC at 80-150 Hz x 20 minutes with HMP.    ASSESSMENT:  CLINICAL  IMPRESSION: Patient with a lowered left shoulder pain-level today but attributes this to a lowered activity level today.  She did well with therapy today and feels her shoulder is getting better.    OBJECTIVE IMPAIRMENTS: decreased activity tolerance, decreased ROM, decreased strength, increased muscle spasms, and pain.   ACTIVITY LIMITATIONS: carrying, lifting, dressing, and reach over head  PARTICIPATION LIMITATIONS: meal prep, cleaning, laundry, and driving  REHAB POTENTIAL: Good  CLINICAL DECISION MAKING: Stable/uncomplicated  EVALUATION COMPLEXITY: Low  GOALS:  SHORT TERM GOALS: Target date: 06/08/22  Ind with an HEP. Goal status: MET  LONG TERM GOALS: Target date: 08/24/22  Ind with advanced HEP. Goal status: IN PROGRESS  2.  Active left shoulder flexion to 145 degrees so the patient can easily reach overhead. Goal status: IN PROGRESS  3.  Active ER to 70 degrees+ to allow for easily donning/doffing of apparel. Goal status: IN PROGRESS  4.  Increase left shoulder strength to a solid 4+/5 to increase stability for performance of functional activities. Goal status: IN PROGRESS  5.  Perform ADL's with pain not > 3/10. Goal status: IN PROGRESS  PLAN:  PT FREQUENCY: 2x/week  PT DURATION: 6 weeks  PLANNED INTERVENTIONS: Therapeutic exercises, Therapeutic activity, Patient/Family education, Self Care, Dry Needling, Electrical stimulation, Cryotherapy, Moist heat, Vasopneumatic device, Ultrasound, and Manual therapy  PLAN FOR NEXT SESSION: Combo e'stim/US, dry needling, RW4, PROM.  Shalicia Craghead, [place], PT 06/21/2022, 5:05 PM

## 2022-06-23 ENCOUNTER — Encounter: Payer: Self-pay | Admitting: *Deleted

## 2022-06-23 ENCOUNTER — Ambulatory Visit: Payer: Medicare Other | Admitting: *Deleted

## 2022-06-23 DIAGNOSIS — M25612 Stiffness of left shoulder, not elsewhere classified: Secondary | ICD-10-CM

## 2022-06-23 DIAGNOSIS — M25512 Pain in left shoulder: Secondary | ICD-10-CM | POA: Diagnosis not present

## 2022-06-23 DIAGNOSIS — M6281 Muscle weakness (generalized): Secondary | ICD-10-CM | POA: Diagnosis not present

## 2022-06-23 NOTE — Therapy (Signed)
OUTPATIENT PHYSICAL THERAPY SHOULDER TREATMENT  Patient Name: Lori Soto MRN: [id number] DOB:01-Jun-1935, 87 y.o., female Today's Date: 06/23/2022  END OF SESSION:  PT End of Session - 06/23/22 1400     Visit Number 9    Number of Visits 12    Date for PT Re-Evaluation 08/23/22    Authorization Type FOTO AT LEAST EVERY 5TH VISIT.  PROGRESS NOTE AT 10TH VISIT.  KX MODIFIER AFTER 15 VISITS.    PT Start Time 1400    PT Stop Time [id number]    PT Time Calculation (min) 45 min            Past Medical History:  Diagnosis Date   Anemia    Arthritis    Cancer ([place])    skin cancer on nose   Carpal tunnel syndrome, bilateral    Cataract    Esophagitis    GERD (gastroesophageal reflux disease)    H/O hiatal hernia    Osteopenia    Scoliosis    Shingles    SVT (supraventricular tachycardia)    Ulcerative colitis    Past Surgical History:  Procedure Laterality Date   ABDOMINAL HYSTERECTOMY     BUNIONECTOMY     Bilateral   CARPAL TUNNEL RELEASE Left 03/10/2015   Procedure: LEFT CARPAL TUNNEL RELEASE;  Surgeon: Daryll Brod, MD;  Location: [place];  Service: Orthopedics;  Laterality: Left;   CARPAL TUNNEL RELEASE Right 10/31/2017   Procedure: RIGHT CARPAL TUNNEL RELEASE;  Surgeon: Daryll Brod, MD;  Location: [place];  Service: Orthopedics;  Laterality: Right;   CATARACT EXTRACTION W/PHACO  05/28/2012   Procedure: CATARACT EXTRACTION PHACO AND INTRAOCULAR LENS PLACEMENT (IOC);  Surgeon: Tonny Branch, MD;  [facility];  Service: Ophthalmology;  Laterality: Right;  CDE=12.84   CATARACT EXTRACTION W/PHACO  06/07/2012   Procedure: CATARACT EXTRACTION PHACO AND INTRAOCULAR LENS PLACEMENT (IOC);  Surgeon: Tonny Branch, MD;  [facility];  Service: Ophthalmology;  Laterality: Left;  CDE 17.60   COLONOSCOPY     COLONOSCOPY N/A 07/08/2015   Procedure: COLONOSCOPY;  Surgeon: Rogene Houston, MD;  [facility];  Service: Endoscopy;  Laterality:  N/A;  930   ESOPHAGEAL DILATION N/A 07/08/2015   Procedure: ESOPHAGEAL DILATION;  Surgeon: Rogene Houston, MD;  [facility];  Service: Endoscopy;  Laterality: N/A;   ESOPHAGOGASTRODUODENOSCOPY N/A 07/08/2015   Procedure: ESOPHAGOGASTRODUODENOSCOPY (EGD);  Surgeon: Rogene Houston, MD;  [facility];  Service: Endoscopy;  Laterality: N/A;   Hernia     Right inguinal   HERNIA REPAIR  1961   right inguinal hernia   UPPER GASTROINTESTINAL ENDOSCOPY     Patient Active Problem List   Diagnosis Date Noted   History of supraventricular tachycardia 11/08/2021   Cough 05/29/2020   Chest congestion 05/29/2020   Arthritis 06/13/2019   GERD (gastroesophageal reflux disease) 06/10/2019   Esophageal stricture 06/10/2019   Educated about COVID-19 virus infection 06/04/2019   SVT (supraventricular tachycardia) 08/28/2018   Elevated troponin 08/28/2018   Light headedness 05/09/2018   Carpal tunnel syndrome of right wrist 05/20/2015   Primary localized osteoarthrosis, hand 05/20/2015   Carpal tunnel syndrome on left 03/18/2015   Osteopenia of the elderly 02/19/2014   DDD (degenerative disc disease), lumbar 12/03/2013   Scoliosis 12/03/2013   Chronic LBP 05/14/2013   IBS (irritable bowel syndrome) 11/08/2012   UC (ulcerative colitis) ([place]) 04/23/2012   Palpitation 10/26/2011   Hypertension 10/26/2011   Chronic ulcerative  colitis ([place]) 03/23/2011   REFERRING PROVIDER: Ronnie Doss DO  REFERRING DIAG: Chronic left shoulder pain.  THERAPY DIAG:  Acute pain of left shoulder  Stiffness of left shoulder, not elsewhere classified  Muscle weakness (generalized)  Rationale for Evaluation and Treatment: Rehabilitation  ONSET DATE: Several months.  SUBJECTIVE:                                                                                                                                                                                      SUBJECTIVE STATEMENT:  15 mins  late today    Pain more today but haven't done much. LT shldr 5/10 PERTINENT HISTORY: Chronic back pain.  PAIN:  Are you having pain? Yes: NPRS scale: 5/10 Pain location: Left shoulder Pain description: Ache, stiff and throbbing. Aggravating factors: Movement. Relieving factors: As above.  PRECAUTIONS: None  PATIENT GOALS:Use left shoulder without pain.  NEXT MD VISIT:   OBJECTIVE:   HEP:     [place] by [place] Applegate Jan 30th, 2024 View at [email or web address] using code: [email or web address]  Page 1 of 1  2 Exercises PECTORALIS CORNER STRETCH While standing at a corner of a wall, place your arms on the walls with elobws bent so that your upper arms are horizontal and your forearms are directed upwards as shown. Take one step forward towards the corner. Bend your front knee until a stretch is felt along the front of your chest and/or shoulders. Your arms should be pointed downward towards the ground. NOTE: Your legs should control the stretch by bending or straightening your front knee. Repeat 4 Times Hold 30 Seconds Complete 1 Set Perform 4 Times a Day Shoulder Flexion Wall Stretch Standing in front of the wall, walk your fingers up the wall until your arm is raised above your hand and you feel a slight stretch in the shoulder. Repeat 10 Times Hold 5 Seconds Complete 2 Sets Perform 4 Times a Day  TODAY'S TREATMENT:  DATE: 06/23/22:                                     EXERCISE LOG  Exercise Comments  UBE         Combo e'stim/US (small soundhead) at 1.50 W/CM2 x 10 minutes to patient's left posterior cuff region   STW/M including ischemic release technique to decrease tone x 13 minutes   IFC at 80-150 Hz x 20 minutes with HMP in sitting    ASSESSMENT:  CLINICAL IMPRESSION: Pt arrived today having some  increased pain in LT shldr for unknown reasons. Rx focused on pain management for LT shldr. With [place] combo, STW as well as IFC. Pt reports decreased pain after Rx. Return to exs next Rx if able.      OBJECTIVE IMPAIRMENTS: decreased activity tolerance, decreased ROM, decreased strength, increased muscle spasms, and pain.   ACTIVITY LIMITATIONS: carrying, lifting, dressing, and reach over head  PARTICIPATION LIMITATIONS: meal prep, cleaning, laundry, and driving  REHAB POTENTIAL: Good  CLINICAL DECISION MAKING: Stable/uncomplicated  EVALUATION COMPLEXITY: Low  GOALS:  SHORT TERM GOALS: Target date: 06/08/22  Ind with an HEP. Goal status: MET  LONG TERM GOALS: Target date: 08/24/22  Ind with advanced HEP. Goal status: IN PROGRESS  2.  Active left shoulder flexion to 145 degrees so the patient can easily reach overhead. Goal status: IN PROGRESS  3.  Active ER to 70 degrees+ to allow for easily donning/doffing of apparel. Goal status: IN PROGRESS  4.  Increase left shoulder strength to a solid 4+/5 to increase stability for performance of functional activities. Goal status: IN PROGRESS  5.  Perform ADL's with pain not > 3/10. Goal status: IN PROGRESS  PLAN:  PT FREQUENCY: 2x/week  PT DURATION: 6 weeks  PLANNED INTERVENTIONS: Therapeutic exercises, Therapeutic activity, Patient/Family education, Self Care, Dry Needling, Electrical stimulation, Cryotherapy, Moist heat, Vasopneumatic device, Ultrasound, and Manual therapy  PLAN FOR NEXT SESSION: Combo e'stim/US, dry needling, RW4, PROM.  Anacleto Batterman,CHRIS, PTA 06/23/2022, 6:12 PM

## 2022-06-28 ENCOUNTER — Ambulatory Visit: Payer: Medicare Other | Admitting: *Deleted

## 2022-06-28 ENCOUNTER — Encounter: Payer: Self-pay | Admitting: *Deleted

## 2022-06-28 DIAGNOSIS — M25512 Pain in left shoulder: Secondary | ICD-10-CM

## 2022-06-28 DIAGNOSIS — M25612 Stiffness of left shoulder, not elsewhere classified: Secondary | ICD-10-CM | POA: Diagnosis not present

## 2022-06-28 DIAGNOSIS — M6281 Muscle weakness (generalized): Secondary | ICD-10-CM | POA: Diagnosis not present

## 2022-06-28 NOTE — Therapy (Addendum)
OUTPATIENT PHYSICAL THERAPY SHOULDER TREATMENT  Patient Name: Lori Soto MRN: [id number] DOB:June 04, 1935, 87 y.o., female Today's Date: 06/28/2022  END OF SESSION:  PT End of Session - 06/28/22 1348     Visit Number 10    Number of Visits 12    Date for PT Re-Evaluation 08/23/22    Authorization Type FOTO AT LEAST EVERY 5TH VISIT.  PROGRESS NOTE AT 10TH VISIT.  KX MODIFIER AFTER 15 VISITS.    PT Start Time [id number]    PT Stop Time 1434    PT Time Calculation (min) 49 min            Past Medical History:  Diagnosis Date   Anemia    Arthritis    Cancer ([place])    skin cancer on nose   Carpal tunnel syndrome, bilateral    Cataract    Esophagitis    GERD (gastroesophageal reflux disease)    H/O hiatal hernia    Osteopenia    Scoliosis    Shingles    SVT (supraventricular tachycardia)    Ulcerative colitis    Past Surgical History:  Procedure Laterality Date   ABDOMINAL HYSTERECTOMY     BUNIONECTOMY     Bilateral   CARPAL TUNNEL RELEASE Left 03/10/2015   Procedure: LEFT CARPAL TUNNEL RELEASE;  Surgeon: Daryll Brod, MD;  Location: [place];  Service: Orthopedics;  Laterality: Left;   CARPAL TUNNEL RELEASE Right 10/31/2017   Procedure: RIGHT CARPAL TUNNEL RELEASE;  Surgeon: Daryll Brod, MD;  Location: [place];  Service: Orthopedics;  Laterality: Right;   CATARACT EXTRACTION W/PHACO  05/28/2012   Procedure: CATARACT EXTRACTION PHACO AND INTRAOCULAR LENS PLACEMENT (IOC);  Surgeon: Tonny Branch, MD;  [facility];  Service: Ophthalmology;  Laterality: Right;  CDE=12.84   CATARACT EXTRACTION W/PHACO  06/07/2012   Procedure: CATARACT EXTRACTION PHACO AND INTRAOCULAR LENS PLACEMENT (IOC);  Surgeon: Tonny Branch, MD;  [facility];  Service: Ophthalmology;  Laterality: Left;  CDE 17.60   COLONOSCOPY     COLONOSCOPY N/A 07/08/2015   Procedure: COLONOSCOPY;  Surgeon: Rogene Houston, MD;  [facility];  Service: Endoscopy;  Laterality:  N/A;  930   ESOPHAGEAL DILATION N/A 07/08/2015   Procedure: ESOPHAGEAL DILATION;  Surgeon: Rogene Houston, MD;  [facility];  Service: Endoscopy;  Laterality: N/A;   ESOPHAGOGASTRODUODENOSCOPY N/A 07/08/2015   Procedure: ESOPHAGOGASTRODUODENOSCOPY (EGD);  Surgeon: Rogene Houston, MD;  [facility];  Service: Endoscopy;  Laterality: N/A;   Hernia     Right inguinal   HERNIA REPAIR  1961   right inguinal hernia   UPPER GASTROINTESTINAL ENDOSCOPY     Patient Active Problem List   Diagnosis Date Noted   History of supraventricular tachycardia 11/08/2021   Cough 05/29/2020   Chest congestion 05/29/2020   Arthritis 06/13/2019   GERD (gastroesophageal reflux disease) 06/10/2019   Esophageal stricture 06/10/2019   Educated about COVID-19 virus infection 06/04/2019   SVT (supraventricular tachycardia) 08/28/2018   Elevated troponin 08/28/2018   Light headedness 05/09/2018   Carpal tunnel syndrome of right wrist 05/20/2015   Primary localized osteoarthrosis, hand 05/20/2015   Carpal tunnel syndrome on left 03/18/2015   Osteopenia of the elderly 02/19/2014   DDD (degenerative disc disease), lumbar 12/03/2013   Scoliosis 12/03/2013   Chronic LBP 05/14/2013   IBS (irritable bowel syndrome) 11/08/2012   UC (ulcerative colitis) ([place]) 04/23/2012   Palpitation 10/26/2011   Hypertension 10/26/2011   Chronic ulcerative  colitis ([place]) 03/23/2011   REFERRING PROVIDER: Ronnie Doss DO  REFERRING DIAG: Chronic left shoulder pain.  THERAPY DIAG:  Acute pain of left shoulder  Stiffness of left shoulder, not elsewhere classified  Muscle weakness (generalized)  Rationale for Evaluation and Treatment: Rehabilitation  ONSET DATE: Several months.  SUBJECTIVE:                                                                                                                                                                                      SUBJECTIVE STATEMENT:    Pain  more today but haven't done much. LT shldr 6/10 PERTINENT HISTORY: Chronic back pain.  PAIN:  Are you having pain? Yes: NPRS scale: 6/10 Pain location: Left shoulder Pain description: Ache, stiff and throbbing. Aggravating factors: Movement. Relieving factors: As above.  PRECAUTIONS: None  PATIENT GOALS:Use left shoulder without pain.  NEXT MD VISIT:   OBJECTIVE:   HEP:     [place] by Mali Applegate Jan 30th, 2024 View at [email or web address] using code: [email or web address]  Page 1 of 1  2 Exercises PECTORALIS CORNER STRETCH While standing at a corner of a wall, place your arms on the walls with elobws bent so that your upper arms are horizontal and your forearms are directed upwards as shown. Take one step forward towards the corner. Bend your front knee until a stretch is felt along the front of your chest and/or shoulders. Your arms should be pointed downward towards the ground. NOTE: Your legs should control the stretch by bending or straightening your front knee. Repeat 4 Times Hold 30 Seconds Complete 1 Set Perform 4 Times a Day Shoulder Flexion Wall Stretch Standing in front of the wall, walk your fingers up the wall until your arm is raised above your hand and you feel a slight stretch in the shoulder. Repeat 10 Times Hold 5 Seconds Complete 2 Sets Perform 4 Times a Day  TODAY'S TREATMENT:  DATE: 06/28/22:                                     EXERCISE LOG  Exercise Comments  UBE X 6 mins        Combo e'stim/US (small soundhead) at 1.50 W/CM2 x 10 minutes to patient's left posterior cuff region   STW/M including ischemic release technique to decrease tone x 12 minutes   IFC at 80-150 Hz x 15 minutes with HMP in sitting    ASSESSMENT:  CLINICAL IMPRESSION: FOTO performed Pt arrived today having some   pain in LT shldr for unknown reasons. She was able to resume UBE and did well. Rx focused on pain management for LT shldr. With [place] combo, STW as well as IFC end of sesssion. Pt reports decreased pain after Rx.       OBJECTIVE IMPAIRMENTS: decreased activity tolerance, decreased ROM, decreased strength, increased muscle spasms, and pain.   ACTIVITY LIMITATIONS: carrying, lifting, dressing, and reach over head  PARTICIPATION LIMITATIONS: meal prep, cleaning, laundry, and driving  REHAB POTENTIAL: Good  CLINICAL DECISION MAKING: Stable/uncomplicated  EVALUATION COMPLEXITY: Low  GOALS:  SHORT TERM GOALS: Target date: 06/08/22  Ind with an HEP. Goal status: MET  LONG TERM GOALS: Target date: 08/24/22  Ind with advanced HEP. Goal status: IN PROGRESS  2.  Active left shoulder flexion to 145 degrees so the patient can easily reach overhead. Goal status: Partially met  3.  Active ER to 70 degrees+ to allow for easily donning/doffing of apparel. Goal status: Partially met  4.  Increase left shoulder strength to a solid 4+/5 to increase stability for performance of functional activities. Goal status: IN PROGRESS  5.  Perform ADL's with pain not > 3/10. Goal status: IN PROGRESS  PLAN:  PT FREQUENCY: 2x/week  PT DURATION: 6 weeks  PLANNED INTERVENTIONS: Therapeutic exercises, Therapeutic activity, Patient/Family education, Self Care, Dry Needling, Electrical stimulation, Cryotherapy, Moist heat, Vasopneumatic device, Ultrasound, and Manual therapy  PLAN FOR NEXT SESSION: Combo e'stim/US, dry needling, RW4, PROM.  Deyonna Fitzsimmons,CHRIS, PTA 06/28/2022, 2:49 PM   Progress Note Reporting Period 05/25/22 to 06/28/22.  See note below for Objective Data and Assessment of Progress/Goals. Patient progressing toward goals with LTG #2 partially met.    Mali Applegate MPT

## 2022-06-30 ENCOUNTER — Ambulatory Visit: Payer: Medicare Other | Admitting: Physical Therapy

## 2022-06-30 DIAGNOSIS — M25512 Pain in left shoulder: Secondary | ICD-10-CM

## 2022-06-30 DIAGNOSIS — M25612 Stiffness of left shoulder, not elsewhere classified: Secondary | ICD-10-CM | POA: Diagnosis not present

## 2022-06-30 DIAGNOSIS — M6281 Muscle weakness (generalized): Secondary | ICD-10-CM | POA: Diagnosis not present

## 2022-06-30 NOTE — Therapy (Signed)
OUTPATIENT PHYSICAL THERAPY SHOULDER TREATMENT  Patient Name: Lori Soto MRN: [id number] DOB:[birth year], 87 y.o., female Today's Date: 06/30/2022  END OF SESSION:  PT End of Session - 06/30/22 1150     Visit Number 11    Number of Visits 12    Date for PT Re-Evaluation 08/23/22    Authorization Type FOTO AT LEAST EVERY 5TH VISIT.  PROGRESS NOTE AT 10TH VISIT.  KX MODIFIER AFTER 15 VISITS.    PT Start Time 1109    PT Stop Time 1205    PT Time Calculation (min) 56 min    Activity Tolerance Patient tolerated treatment well    Behavior During Therapy WFL for tasks assessed/performed            Past Medical History:  Diagnosis Date   Anemia    Arthritis    Cancer ([place])    skin cancer on nose   Carpal tunnel syndrome, bilateral    Cataract    Esophagitis    GERD (gastroesophageal reflux disease)    H/O hiatal hernia    Osteopenia    Scoliosis    Shingles    SVT (supraventricular tachycardia)    Ulcerative colitis    Past Surgical History:  Procedure Laterality Date   ABDOMINAL HYSTERECTOMY     BUNIONECTOMY     Bilateral   CARPAL TUNNEL RELEASE Left 03/10/2015   Procedure: LEFT CARPAL TUNNEL RELEASE;  Surgeon: Daryll Brod, MD;  Location: [place];  Service: Orthopedics;  Laterality: Left;   CARPAL TUNNEL RELEASE Right 10/31/2017   Procedure: RIGHT CARPAL TUNNEL RELEASE;  Surgeon: Daryll Brod, MD;  Location: [place];  Service: Orthopedics;  Laterality: Right;   CATARACT EXTRACTION W/PHACO  05/28/2012   Procedure: CATARACT EXTRACTION PHACO AND INTRAOCULAR LENS PLACEMENT (IOC);  Surgeon: Tonny Branch, MD;  [facility];  Service: Ophthalmology;  Laterality: Right;  CDE=12.84   CATARACT EXTRACTION W/PHACO  06/07/2012   Procedure: CATARACT EXTRACTION PHACO AND INTRAOCULAR LENS PLACEMENT (IOC);  Surgeon: Tonny Branch, MD;  [facility];  Service: Ophthalmology;  Laterality: Left;  CDE 17.60   COLONOSCOPY     COLONOSCOPY N/A 07/08/2015    Procedure: COLONOSCOPY;  Surgeon: Rogene Houston, MD;  [facility];  Service: Endoscopy;  Laterality: N/A;  930   ESOPHAGEAL DILATION N/A 07/08/2015   Procedure: ESOPHAGEAL DILATION;  Surgeon: Rogene Houston, MD;  [facility];  Service: Endoscopy;  Laterality: N/A;   ESOPHAGOGASTRODUODENOSCOPY N/A 07/08/2015   Procedure: ESOPHAGOGASTRODUODENOSCOPY (EGD);  Surgeon: Rogene Houston, MD;  [facility];  Service: Endoscopy;  Laterality: N/A;   Hernia     Right inguinal   HERNIA REPAIR  1961   right inguinal hernia   UPPER GASTROINTESTINAL ENDOSCOPY     Patient Active Problem List   Diagnosis Date Noted   History of supraventricular tachycardia 11/08/2021   Cough 05/29/2020   Chest congestion 05/29/2020   Arthritis 06/13/2019   GERD (gastroesophageal reflux disease) 06/10/2019   Esophageal stricture 06/10/2019   Educated about COVID-19 virus infection 06/04/2019   SVT (supraventricular tachycardia) 08/28/2018   Elevated troponin 08/28/2018   Light headedness 05/09/2018   Carpal tunnel syndrome of right wrist 05/20/2015   Primary localized osteoarthrosis, hand 05/20/2015   Carpal tunnel syndrome on left 03/18/2015   Osteopenia of the elderly 02/19/2014   DDD (degenerative disc disease), lumbar 12/03/2013   Scoliosis 12/03/2013   Chronic LBP 05/14/2013   IBS (irritable bowel syndrome) 11/08/2012  UC (ulcerative colitis) ([place]) 04/23/2012   Palpitation 10/26/2011   Hypertension 10/26/2011   Chronic ulcerative colitis ([place]) 03/23/2011   REFERRING PROVIDER: Ronnie Doss DO  REFERRING DIAG: Chronic left shoulder pain.  THERAPY DIAG:  Acute pain of left shoulder  Stiffness of left shoulder, not elsewhere classified  Rationale for Evaluation and Treatment: Rehabilitation  ONSET DATE: Several months.  SUBJECTIVE:                                                                                                                                                                                       SUBJECTIVE STATEMENT:  Pain low right now. PERTINENT HISTORY: Chronic back pain.  PAIN:  Are you having pain? Yes: NPRS scale: 2/10 Pain location: Left shoulder Pain description: Ache, stiff and throbbing. Aggravating factors: Movement. Relieving factors: As above.  PRECAUTIONS: None  PATIENT GOALS:Use left shoulder without pain.  NEXT MD VISIT:   OBJECTIVE:   HEP:     [place] by [place] Abubakr Wieman Jan 30th, 2024 View at [email or web address] using code: [email or web address]  Page 1 of 1  2 Exercises PECTORALIS CORNER STRETCH While standing at a corner of a wall, place your arms on the walls with elobws bent so that your upper arms are horizontal and your forearms are directed upwards as shown. Take one step forward towards the corner. Bend your front knee until a stretch is felt along the front of your chest and/or shoulders. Your arms should be pointed downward towards the ground. NOTE: Your legs should control the stretch by bending or straightening your front knee. Repeat 4 Times Hold 30 Seconds Complete 1 Set Perform 4 Times a Day Shoulder Flexion Wall Stretch Standing in front of the wall, walk your fingers up the wall until your arm is raised above your hand and you feel a slight stretch in the shoulder. Repeat 10 Times Hold 5 Seconds Complete 2 Sets Perform 4 Times a Day  TODAY'S TREATMENT:  DATE: 06/30/22:                                     EXERCISE LOG  Exercise Comments  UBE X 8 mins  Pulleys  X 5 minutes  RW4 Yellow band to fatigue.  Bicep curls Yellow band to fatigue.  STW/M x 8 minutes to reduce tone to left post cuff musculature.  HMP and IFC at 80-150 Hz on 40% scan x 20 minutes.    ASSESSMENT:  CLINICAL IMPRESSION: Patient did well with yellow  theraband resisted exercise performing without complaint.  Left shoulder pain lower upon presentation tot he clinic today.    OBJECTIVE IMPAIRMENTS: decreased activity tolerance, decreased ROM, decreased strength, increased muscle spasms, and pain.   ACTIVITY LIMITATIONS: carrying, lifting, dressing, and reach over head  PARTICIPATION LIMITATIONS: meal prep, cleaning, laundry, and driving  REHAB POTENTIAL: Good  CLINICAL DECISION MAKING: Stable/uncomplicated  EVALUATION COMPLEXITY: Low  GOALS:  SHORT TERM GOALS: Target date: 06/08/22  Ind with an HEP. Goal status: MET  LONG TERM GOALS: Target date: 08/24/22  Ind with advanced HEP. Goal status: IN PROGRESS  2.  Active left shoulder flexion to 145 degrees so the patient can easily reach overhead. Goal status: Partially met  3.  Active ER to 70 degrees+ to allow for easily donning/doffing of apparel. Goal status: Partially met  4.  Increase left shoulder strength to a solid 4+/5 to increase stability for performance of functional activities. Goal status: IN PROGRESS  5.  Perform ADL's with pain not > 3/10. Goal status: IN PROGRESS  PLAN:  PT FREQUENCY: 2x/week  PT DURATION: 6 weeks  PLANNED INTERVENTIONS: Therapeutic exercises, Therapeutic activity, Patient/Family education, Self Care, Dry Needling, Electrical stimulation, Cryotherapy, Moist heat, Vasopneumatic device, Ultrasound, and Manual therapy  PLAN FOR NEXT SESSION: Combo e'stim/US, dry needling, RW4, PROM.  Nyhla Mountjoy, [place], PT 06/30/2022, 12:16 PM

## 2022-07-05 ENCOUNTER — Ambulatory Visit: Payer: Medicare Other | Attending: Family Medicine | Admitting: *Deleted

## 2022-07-05 ENCOUNTER — Encounter: Payer: Self-pay | Admitting: *Deleted

## 2022-07-05 DIAGNOSIS — M25512 Pain in left shoulder: Secondary | ICD-10-CM | POA: Insufficient documentation

## 2022-07-05 DIAGNOSIS — M6281 Muscle weakness (generalized): Secondary | ICD-10-CM | POA: Diagnosis not present

## 2022-07-05 DIAGNOSIS — M25612 Stiffness of left shoulder, not elsewhere classified: Secondary | ICD-10-CM | POA: Diagnosis not present

## 2022-07-05 NOTE — Addendum Note (Signed)
Addended by: Bay Jarquin, [place] W on: 07/05/2022 03:57 PM   Modules accepted: Orders

## 2022-07-05 NOTE — Therapy (Addendum)
OUTPATIENT PHYSICAL THERAPY SHOULDER TREATMENT  Patient Name: Lori Soto MRN: [id number] DOB:1935/07/13, 87 y.o., female Today's Date: 07/05/2022  END OF SESSION:  PT End of Session - 07/05/22 1048     Visit Number 12    Number of Visits 16    Date for PT Re-Evaluation 08/23/22    Authorization Type FOTO AT LEAST EVERY 5TH VISIT.  PROGRESS NOTE AT 10TH VISIT.  KX MODIFIER AFTER 15 VISITS.    PT Start Time 1030    PT Stop Time 1120    PT Time Calculation (min) 50 min            Past Medical History:  Diagnosis Date   Anemia    Arthritis    Cancer ([place])    skin cancer on nose   Carpal tunnel syndrome, bilateral    Cataract    Esophagitis    GERD (gastroesophageal reflux disease)    H/O hiatal hernia    Osteopenia    Scoliosis    Shingles    SVT (supraventricular tachycardia)    Ulcerative colitis    Past Surgical History:  Procedure Laterality Date   ABDOMINAL HYSTERECTOMY     BUNIONECTOMY     Bilateral   CARPAL TUNNEL RELEASE Left 03/10/2015   Procedure: LEFT CARPAL TUNNEL RELEASE;  Surgeon: Daryll Brod, MD;  Location: [place];  Service: Orthopedics;  Laterality: Left;   CARPAL TUNNEL RELEASE Right 10/31/2017   Procedure: RIGHT CARPAL TUNNEL RELEASE;  Surgeon: Daryll Brod, MD;  Location: [place];  Service: Orthopedics;  Laterality: Right;   CATARACT EXTRACTION W/PHACO  05/28/2012   Procedure: CATARACT EXTRACTION PHACO AND INTRAOCULAR LENS PLACEMENT (IOC);  Surgeon: Tonny Branch, MD;  [facility];  Service: Ophthalmology;  Laterality: Right;  CDE=12.84   CATARACT EXTRACTION W/PHACO  06/07/2012   Procedure: CATARACT EXTRACTION PHACO AND INTRAOCULAR LENS PLACEMENT (IOC);  Surgeon: Tonny Branch, MD;  [facility];  Service: Ophthalmology;  Laterality: Left;  CDE 17.60   COLONOSCOPY     COLONOSCOPY N/A 07/08/2015   Procedure: COLONOSCOPY;  Surgeon: Rogene Houston, MD;  [facility];  Service: Endoscopy;  Laterality:  N/A;  930   ESOPHAGEAL DILATION N/A 07/08/2015   Procedure: ESOPHAGEAL DILATION;  Surgeon: Rogene Houston, MD;  [facility];  Service: Endoscopy;  Laterality: N/A;   ESOPHAGOGASTRODUODENOSCOPY N/A 07/08/2015   Procedure: ESOPHAGOGASTRODUODENOSCOPY (EGD);  Surgeon: Rogene Houston, MD;  [facility];  Service: Endoscopy;  Laterality: N/A;   Hernia     Right inguinal   HERNIA REPAIR  1961   right inguinal hernia   UPPER GASTROINTESTINAL ENDOSCOPY     Patient Active Problem List   Diagnosis Date Noted   History of supraventricular tachycardia 11/08/2021   Cough 05/29/2020   Chest congestion 05/29/2020   Arthritis 06/13/2019   GERD (gastroesophageal reflux disease) 06/10/2019   Esophageal stricture 06/10/2019   Educated about COVID-19 virus infection 06/04/2019   SVT (supraventricular tachycardia) 08/28/2018   Elevated troponin 08/28/2018   Light headedness 05/09/2018   Carpal tunnel syndrome of right wrist 05/20/2015   Primary localized osteoarthrosis, hand 05/20/2015   Carpal tunnel syndrome on left 03/18/2015   Osteopenia of the elderly 02/19/2014   DDD (degenerative disc disease), lumbar 12/03/2013   Scoliosis 12/03/2013   Chronic LBP 05/14/2013   IBS (irritable bowel syndrome) 11/08/2012   UC (ulcerative colitis) ([place]) 04/23/2012   Palpitation 10/26/2011   Hypertension 10/26/2011   Chronic ulcerative  colitis ([place]) 03/23/2011   REFERRING PROVIDER: Ronnie Doss DO  REFERRING DIAG: Chronic left shoulder pain.  THERAPY DIAG:  Acute pain of left shoulder  Stiffness of left shoulder, not elsewhere classified  Muscle weakness (generalized)  Rationale for Evaluation and Treatment: Rehabilitation  ONSET DATE: Several months.  SUBJECTIVE:                                                                                                                                                                                      SUBJECTIVE STATEMENT:  Pain low  right now.. Would like to continue x 4 visits PERTINENT HISTORY: Chronic back pain.  PAIN:  Are you having pain? Yes: NPRS scale: 2/10 Pain location: Left shoulder Pain description: Ache, stiff and throbbing. Aggravating factors: Movement. Relieving factors: As above.  PRECAUTIONS: None  PATIENT GOALS:Use left shoulder without pain.  NEXT MD VISIT:   OBJECTIVE:   HEP:     [place] by Mali Applegate Jan 30th, 2024 View at [email or web address] using code: [email or web address]  Page 1 of 1  2 Exercises PECTORALIS CORNER STRETCH While standing at a corner of a wall, place your arms on the walls with elobws bent so that your upper arms are horizontal and your forearms are directed upwards as shown. Take one step forward towards the corner. Bend your front knee until a stretch is felt along the front of your chest and/or shoulders. Your arms should be pointed downward towards the ground. NOTE: Your legs should control the stretch by bending or straightening your front knee. Repeat 4 Times Hold 30 Seconds Complete 1 Set Perform 4 Times a Day Shoulder Flexion Wall Stretch Standing in front of the wall, walk your fingers up the wall until your arm is raised above your hand and you feel a slight stretch in the shoulder. Repeat 10 Times Hold 5 Seconds Complete 2 Sets Perform 4 Times a Day  TODAY'S TREATMENT:  DATE:                                    EXERCISE LOG                 07-05-22  Exercise Comments  UBE X 8 mins  Pulleys  X 5 minutes  RW4 Yellow band to fatigue.  Pain with ER  Bicep curls   STW/M x 8 minutes to reduce tone to left post cuff musculature.  HMP and IFC at 80-150 Hz on 40% scan x 20 minutes.    ASSESSMENT:  CLINICAL IMPRESSION:  Patient arrived today doing fair with LT shldr pain Rx focused on  increasing ROM as well as light strengthening.  Patient did well with yellow theraband resisted exercise performing without complaint except with some pain during ER. Pt is progressing towards LTGs, but unable to meet due to deficits    OBJECTIVE IMPAIRMENTS: decreased activity tolerance, decreased ROM, decreased strength, increased muscle spasms, and pain.   ACTIVITY LIMITATIONS: carrying, lifting, dressing, and reach over head  PARTICIPATION LIMITATIONS: meal prep, cleaning, laundry, and driving  REHAB POTENTIAL: Good  CLINICAL DECISION MAKING: Stable/uncomplicated  EVALUATION COMPLEXITY: Low  GOALS:  SHORT TERM GOALS: Target date: 06/08/22  Ind with an HEP. Goal status: MET  LONG TERM GOALS: Target date: 08/24/22  Ind with advanced HEP. Goal status: IN PROGRESS  2.  Active left shoulder flexion to 145 degrees so the patient can easily reach overhead. Goal status: Partially met  130 degrees  3.  Active ER to 70 degrees+ to allow for easily donning/doffing of apparel. Goal status: Partially met  4.  Increase left shoulder strength to a solid 4+/5 to increase stability for performance of functional activities. Goal status: IN PROGRESS  5.  Perform ADL's with pain not > 3/10. Goal status: IN PROGRESS  PLAN:  PT FREQUENCY: 2x/week  PT DURATION: 6 weeks  PLANNED INTERVENTIONS: Therapeutic exercises, Therapeutic activity, Patient/Family education, Self Care, Dry Needling, Electrical stimulation, Cryotherapy, Moist heat, Vasopneumatic device, Ultrasound, and Manual therapy  PLAN FOR NEXT SESSION: Combo e'stim/US, dry needling, RW4, PROM.   Recert  APPLEGATE, [place], PT 07/05/2022, 3:55 PM

## 2022-07-06 ENCOUNTER — Telehealth: Payer: Self-pay | Admitting: Gastroenterology

## 2022-07-06 NOTE — Telephone Encounter (Signed)
Patient left a message that she wanted to move up the 8/29 appt before the 22nd.  She said that has a conflict.

## 2022-07-07 ENCOUNTER — Encounter: Payer: Self-pay | Admitting: Physical Therapy

## 2022-07-07 ENCOUNTER — Ambulatory Visit: Payer: Medicare Other | Admitting: Physical Therapy

## 2022-07-07 DIAGNOSIS — M6281 Muscle weakness (generalized): Secondary | ICD-10-CM

## 2022-07-07 DIAGNOSIS — M25612 Stiffness of left shoulder, not elsewhere classified: Secondary | ICD-10-CM

## 2022-07-07 DIAGNOSIS — M25512 Pain in left shoulder: Secondary | ICD-10-CM | POA: Diagnosis not present

## 2022-07-07 NOTE — Therapy (Signed)
OUTPATIENT PHYSICAL THERAPY SHOULDER TREATMENT  Patient Name: Lori Soto MRN: [id number] DOB:03-06-1936, 87 y.o., female Today's Date: 07/07/2022  END OF SESSION:  PT End of Session - 07/07/22 1154     Visit Number 13    Number of Visits 16    Date for PT Re-Evaluation 08/23/22    Authorization Type FOTO AT LEAST EVERY 5TH VISIT.  PROGRESS NOTE AT 10TH VISIT.  KX MODIFIER AFTER 15 VISITS.    PT Start Time 1122    PT Stop Time 1200    PT Time Calculation (min) 38 min    Activity Tolerance Patient tolerated treatment well    Behavior During Therapy WFL for tasks assessed/performed            Past Medical History:  Diagnosis Date   Anemia    Arthritis    Cancer ([place])    skin cancer on nose   Carpal tunnel syndrome, bilateral    Cataract    Esophagitis    GERD (gastroesophageal reflux disease)    H/O hiatal hernia    Osteopenia    Scoliosis    Shingles    SVT (supraventricular tachycardia)    Ulcerative colitis    Past Surgical History:  Procedure Laterality Date   ABDOMINAL HYSTERECTOMY     BUNIONECTOMY     Bilateral   CARPAL TUNNEL RELEASE Left 03/10/2015   Procedure: LEFT CARPAL TUNNEL RELEASE;  Surgeon: Daryll Brod, MD;  Location: [place];  Service: Orthopedics;  Laterality: Left;   CARPAL TUNNEL RELEASE Right 10/31/2017   Procedure: RIGHT CARPAL TUNNEL RELEASE;  Surgeon: Daryll Brod, MD;  Location: [place];  Service: Orthopedics;  Laterality: Right;   CATARACT EXTRACTION W/PHACO  05/28/2012   Procedure: CATARACT EXTRACTION PHACO AND INTRAOCULAR LENS PLACEMENT (IOC);  Surgeon: Tonny Branch, MD;  [facility];  Service: Ophthalmology;  Laterality: Right;  CDE=12.84   CATARACT EXTRACTION W/PHACO  06/07/2012   Procedure: CATARACT EXTRACTION PHACO AND INTRAOCULAR LENS PLACEMENT (IOC);  Surgeon: Tonny Branch, MD;  [facility];  Service: Ophthalmology;  Laterality: Left;  CDE 17.60   COLONOSCOPY     COLONOSCOPY N/A 07/08/2015    Procedure: COLONOSCOPY;  Surgeon: Rogene Houston, MD;  [facility];  Service: Endoscopy;  Laterality: N/A;  930   ESOPHAGEAL DILATION N/A 07/08/2015   Procedure: ESOPHAGEAL DILATION;  Surgeon: Rogene Houston, MD;  [facility];  Service: Endoscopy;  Laterality: N/A;   ESOPHAGOGASTRODUODENOSCOPY N/A 07/08/2015   Procedure: ESOPHAGOGASTRODUODENOSCOPY (EGD);  Surgeon: Rogene Houston, MD;  [facility];  Service: Endoscopy;  Laterality: N/A;   Hernia     Right inguinal   HERNIA REPAIR  1961   right inguinal hernia   UPPER GASTROINTESTINAL ENDOSCOPY     Patient Active Problem List   Diagnosis Date Noted   History of supraventricular tachycardia 11/08/2021   Cough 05/29/2020   Chest congestion 05/29/2020   Arthritis 06/13/2019   GERD (gastroesophageal reflux disease) 06/10/2019   Esophageal stricture 06/10/2019   Educated about COVID-19 virus infection 06/04/2019   SVT (supraventricular tachycardia) 08/28/2018   Elevated troponin 08/28/2018   Light headedness 05/09/2018   Carpal tunnel syndrome of right wrist 05/20/2015   Primary localized osteoarthrosis, hand 05/20/2015   Carpal tunnel syndrome on left 03/18/2015   Osteopenia of the elderly 02/19/2014   DDD (degenerative disc disease), lumbar 12/03/2013   Scoliosis 12/03/2013   Chronic LBP 05/14/2013   IBS (irritable bowel syndrome) 11/08/2012  UC (ulcerative colitis) ([place]) 04/23/2012   Palpitation 10/26/2011   Hypertension 10/26/2011   Chronic ulcerative colitis ([place]) 03/23/2011   REFERRING PROVIDER: Ronnie Doss DO  REFERRING DIAG: Chronic left shoulder pain.  THERAPY DIAG:  No diagnosis found.  Rationale for Evaluation and Treatment: Rehabilitation  ONSET DATE: Several months.  SUBJECTIVE:                                                                                                                                                                                      SUBJECTIVE  STATEMENT: Pain more in posterior shoulder.  PERTINENT HISTORY: Chronic back pain.  PAIN:  Are you having pain? Yes: NPRS scale: no pain score provided/10 Pain location: Left shoulder Pain description: Ache, stiff and throbbing. Aggravating factors: Movement. Relieving factors: As above.  PRECAUTIONS: None  PATIENT GOALS:Use left shoulder without pain.  NEXT MD VISIT:   OBJECTIVE:   HEP:     [place] by Mali Applegate Jan 30th, 2024 View at [email or web address] using code: [email or web address]  Page 1 of 1  2 Exercises PECTORALIS CORNER STRETCH While standing at a corner of a wall, place your arms on the walls with elobws bent so that your upper arms are horizontal and your forearms are directed upwards as shown. Take one step forward towards the corner. Bend your front knee until a stretch is felt along the front of your chest and/or shoulders. Your arms should be pointed downward towards the ground. NOTE: Your legs should control the stretch by bending or straightening your front knee. Repeat 4 Times Hold 30 Seconds Complete 1 Set Perform 4 Times a Day Shoulder Flexion Wall Stretch Standing in front of the wall, walk your fingers up the wall until your arm is raised above your hand and you feel a slight stretch in the shoulder. Repeat 10 Times Hold 5 Seconds Complete 2 Sets Perform 4 Times a Day  TODAY'S TREATMENT:  DATE:                                    EXERCISE LOG                 07-07-22  Pulleys  X 5 minutes  RW4 Yellow band x20 reps  Pain with ER  Shoulder adduction Yellow x20 reps   Manual Therapy Soft Tissue Mobilization: L posterior shoulder, to reduce tightness    Modalities  Date: 07/07/22 Unattended Estim: Shoulder, Pre-Mod, 10 mins, Pain  ASSESSMENT:  CLINICAL IMPRESSION:    Patient  presented in clinic and indicating pain more in teres major and latissimus dorsi region. Patient guided through light shoulder strengthening with greatest discomfort indicated with ER. Mild tenderness palpable in posterior shoulder at teres major region. Normal modalities response noted following removal of the modalities.  OBJECTIVE IMPAIRMENTS: decreased activity tolerance, decreased ROM, decreased strength, increased muscle spasms, and pain.   ACTIVITY LIMITATIONS: carrying, lifting, dressing, and reach over head  PARTICIPATION LIMITATIONS: meal prep, cleaning, laundry, and driving  REHAB POTENTIAL: Good  CLINICAL DECISION MAKING: Stable/uncomplicated  EVALUATION COMPLEXITY: Low  GOALS:  SHORT TERM GOALS: Target date: 06/08/22  Ind with an HEP. Goal status: MET  LONG TERM GOALS: Target date: 08/24/22  Ind with advanced HEP. Goal status: IN PROGRESS  2.  Active left shoulder flexion to 145 degrees so the patient can easily reach overhead. Goal status: Partially met  130 degrees  3.  Active ER to 70 degrees+ to allow for easily donning/doffing of apparel. Goal status: Partially met  4.  Increase left shoulder strength to a solid 4+/5 to increase stability for performance of functional activities. Goal status: IN PROGRESS  5.  Perform ADL's with pain not > 3/10. Goal status: IN PROGRESS  PLAN:  PT FREQUENCY: 2x/week  PT DURATION: 6 weeks  PLANNED INTERVENTIONS: Therapeutic exercises, Therapeutic activity, Patient/Family education, Self Care, Dry Needling, Electrical stimulation, Cryotherapy, Moist heat, Vasopneumatic device, Ultrasound, and Manual therapy  PLAN FOR NEXT SESSION: Combo e'stim/US, dry needling, RW4, PROM.   Recert  Standley Brooking, PTA 07/07/2022, 12:17 PM

## 2022-07-12 NOTE — Progress Notes (Unsigned)
Cardiology Office Note   Date:  07/13/2022   ID:  Lori Soto, DOB January 31, 1936, MRN [id number]  PCP:  [name], DO  Cardiologist:   Minus Breeding, MD   Chief Complaint  Patient presents with   Palpitations       History of Present Illness: Lori Soto is a 87 y.o. female who presents for evaluation of palpitations.   I saw her for evaluation of palpitations in 2014.  She has a heart murmur and an echo in 2011 was normal.  She was in in the ED in August. 2018 but there was no recorded arrhythmia.  She had palpitations but these were infrequent so no further work up was suggested.   She was in the ED in April 2020 with SVT.  She had mildly elevated troponin.   She had an SVT with a rate of 174 She was treated with adenosine.     She was treated with metoprolol.   She had ED presentation in August 2022 requiring adenosine.  She was in the ED most recently 7 days ago with SVT.  I reviewed these records for this visit.   She required treatment with adenosine.    Since I last saw her she has had 1 episode of palpitations that she was able to self medicated at home.  She otherwise has done well.  She still doing Meals on Wheels.  The patient denies any new symptoms such as chest discomfort, neck or arm discomfort. There has been no new shortness of breath, PND or orthopnea. There have been no reported palpitations, presyncope or syncope.    Past Medical History:  Diagnosis Date   Anemia    Arthritis    Cancer ([place])    skin cancer on nose   Carpal tunnel syndrome, bilateral    Cataract    Esophagitis    GERD (gastroesophageal reflux disease)    H/O hiatal hernia    Osteopenia    Scoliosis    Shingles    SVT (supraventricular tachycardia)    Ulcerative colitis     Past Surgical History:  Procedure Laterality Date   ABDOMINAL HYSTERECTOMY     BUNIONECTOMY     Bilateral   CARPAL TUNNEL RELEASE Left 03/10/2015   Procedure: LEFT CARPAL TUNNEL RELEASE;   Surgeon: Daryll Brod, MD;  Location: [place];  Service: Orthopedics;  Laterality: Left;   CARPAL TUNNEL RELEASE Right 10/31/2017   Procedure: RIGHT CARPAL TUNNEL RELEASE;  Surgeon: Daryll Brod, MD;  Location: [place];  Service: Orthopedics;  Laterality: Right;   CATARACT EXTRACTION W/PHACO  05/28/2012   Procedure: CATARACT EXTRACTION PHACO AND INTRAOCULAR LENS PLACEMENT (IOC);  Surgeon: Tonny Branch, MD;  [facility];  Service: Ophthalmology;  Laterality: Right;  CDE=12.84   CATARACT EXTRACTION W/PHACO  06/07/2012   Procedure: CATARACT EXTRACTION PHACO AND INTRAOCULAR LENS PLACEMENT (IOC);  Surgeon: Tonny Branch, MD;  [facility];  Service: Ophthalmology;  Laterality: Left;  CDE 17.60   COLONOSCOPY     COLONOSCOPY N/A 07/08/2015   Procedure: COLONOSCOPY;  Surgeon: Rogene Houston, MD;  [facility];  Service: Endoscopy;  Laterality: N/A;  930   ESOPHAGEAL DILATION N/A 07/08/2015   Procedure: ESOPHAGEAL DILATION;  Surgeon: Rogene Houston, MD;  [facility];  Service: Endoscopy;  Laterality: N/A;   ESOPHAGOGASTRODUODENOSCOPY N/A 07/08/2015   Procedure: ESOPHAGOGASTRODUODENOSCOPY (EGD);  Surgeon: Rogene Houston, MD;  [facility];  Service:  Endoscopy;  Laterality: N/A;   Hernia     Right inguinal   HERNIA REPAIR  1961   right inguinal hernia   UPPER GASTROINTESTINAL ENDOSCOPY       Current Outpatient Medications  Medication Sig Dispense Refill   acetaminophen (TYLENOL) 650 MG CR tablet Take 650 mg by mouth every 8 (eight) hours as needed. Patient states that she takes prn for arthritis in her knees.     Bromelains 500 MG TABS Take 500 mg by mouth daily.     Coenzyme Q10 (CO Q-10 PO) Take 1 tablet by mouth daily. With red yeast rice [employer]     COLLAGEN PO Take 1 tablet by mouth daily. This is known as 1-2-3     Cyanocobalamin (VITAMIN B 12 PO) Take 1,000 mcg by mouth daily.     Ginkgo Biloba 40 MG TABS Take 40 mg  by mouth daily.     glucosamine-chondroitin 500-400 MG tablet Take 1 tablet by mouth 2 (two) times daily.     Hyaluronic Acid-Vitamin C (HYALURONIC ACID PO) Take 80 mg by mouth daily.     Mesalamine (ASACOL) 400 MG CPDR DR capsule TAKE 2 CAPSULES DAILY 180 capsule 3   metoprolol tartrate (LOPRESSOR) 25 MG tablet TAKE 1 TABLET TWICE A DAY (Patient taking differently: 25 mg. Take one tablet in the morning and 1/2 tablet by mouth in the evening) 180 tablet 3   MULTIPLE VITAMINS PO Take 1 tablet by mouth daily. In the mornings     Nutritional Supplements (GRAPESEED EXTRACT PO) Take 60 mg by mouth daily.      OAT BRAN SOLUBLE PO Take 1 tablet by mouth daily.     OVER THE COUNTER MEDICATION Take 66 mg by mouth daily. Vision Gold Lutein     OVER THE COUNTER MEDICATION Take 250 mg by mouth daily. Alphalipoic Acid 250 mg daily     pantoprazole (PROTONIX) 20 MG tablet TAKE 1 TABLET BY MOUTH DAILY  BEFORE BREAKFAST 90 tablet 3   pyridOXINE (VITAMIN B-6) 100 MG tablet Take 100 mg by mouth daily.     Turmeric 500 MG CAPS Take 1 capsule by mouth 3 (three) times daily.      Vitamins C E (CRANBERRY CONCENTRATE PO) Take 2,500 mg by mouth daily.     Zinc 50 MG CAPS Take 50 mg by mouth daily.     No current facility-administered medications for this visit.    Allergies:   Penicillins, Influenza virus vacc split pf, and Tape    ROS:  Please see the history of present illness.   Otherwise, review of systems are positive for back pain.   All other systems are reviewed and negative.    PHYSICAL EXAM: VS:  BP 130/80   Pulse (!) 52   Ht 5' (1.524 m)   Wt 124 lb (56.2 kg)   BMI 24.22 kg/m  , BMI Body mass index is 24.22 kg/m. GENERAL:  Well appearing NECK:  No jugular venous distention, waveform within normal limits, carotid upstroke brisk and symmetric, no bruits, no thyromegaly LUNGS:  Clear to auscultation bilaterally CHEST:  Unremarkable HEART:  PMI not displaced or sustained,S1 and S2 within normal  limits, no S3, no S4, no clicks, no rubs, 2 out of 6 apical systolic murmur radiating slightly at the aortic outflow tract, no diastolic murmurs ABD:  Flat, positive bowel sounds normal in frequency in pitch, no bruits, no rebound, no guarding, no midline pulsatile mass, no hepatomegaly, no splenomegaly  EXT:  2 plus pulses throughout, no edema, no cyanosis no clubbing   EKG:  EKG is not ordered today.    Recent Labs: 05/31/2022: ALT 17; BUN 9; Creatinine, Ser 0.77; Hemoglobin 13.2; Platelets 241; Potassium 4.6; Sodium 143; TSH 1.620    Lipid Panel    Component Value Date/Time   CHOL 178 05/31/2022 1047   TRIG 50 05/31/2022 1047   TRIG 79 12/18/2015 1105   HDL 61 05/31/2022 1047   HDL 65 12/18/2015 1105   CHOLHDL 2.9 05/31/2022 1047   LDLCALC 107 (H) 05/31/2022 1047   LDLCALC 109 (H) 11/29/2013 0822      Wt Readings from Last 3 Encounters:  07/13/22 124 lb (56.2 kg)  06/17/22 121 lb (54.9 kg)  05/17/22 124 lb (56.2 kg)      Other studies Reviewed: Additional studies/ records that were reviewed today include:   Labs. Review of the above records demonstrates:  Please see elsewhere in the note.     ASSESSMENT AND PLAN:   SVT:   She has had one episode since I last saw her but she is able to control this now.  She does not want ablation.  No change in therapy.  HTN:   Her blood pressure is at target.  No change in therapy.  MURMUR: I suspect she has some mild aortic sclerosis.  I do not think he has symptoms related to this.  I will follow this clinically.   Current medicines are reviewed at length with the patient today.  The patient does not have concerns regarding medicines.  The following changes have been made:   None  Labs/ tests ordered today include: None No orders of the defined types were placed in this encounter.   Disposition:   FU with me 12 months in [place].     Signed, Minus Breeding, MD  07/13/2022 12:56 PM    [facility]

## 2022-07-13 ENCOUNTER — Encounter: Payer: Self-pay | Admitting: Cardiology

## 2022-07-13 ENCOUNTER — Ambulatory Visit: Payer: Medicare Other | Admitting: *Deleted

## 2022-07-13 ENCOUNTER — Telehealth: Payer: Self-pay | Admitting: Cardiology

## 2022-07-13 ENCOUNTER — Ambulatory Visit (INDEPENDENT_AMBULATORY_CARE_PROVIDER_SITE_OTHER): Payer: Medicare Other | Admitting: Cardiology

## 2022-07-13 VITALS — BP 130/80 | HR 52 | Ht 60.0 in | Wt 124.0 lb

## 2022-07-13 DIAGNOSIS — I471 Supraventricular tachycardia, unspecified: Secondary | ICD-10-CM | POA: Diagnosis not present

## 2022-07-13 DIAGNOSIS — M25512 Pain in left shoulder: Secondary | ICD-10-CM

## 2022-07-13 DIAGNOSIS — I10 Essential (primary) hypertension: Secondary | ICD-10-CM

## 2022-07-13 DIAGNOSIS — M6281 Muscle weakness (generalized): Secondary | ICD-10-CM | POA: Diagnosis not present

## 2022-07-13 DIAGNOSIS — M25612 Stiffness of left shoulder, not elsewhere classified: Secondary | ICD-10-CM | POA: Diagnosis not present

## 2022-07-13 NOTE — Telephone Encounter (Signed)
Will forward to Dr Hochrein for his information 

## 2022-07-13 NOTE — Patient Instructions (Signed)
Medication Instructions:  The current medical regimen is effective;  continue present plan and medications.  *If you need a refill on your cardiac medications before your next appointment, please call your pharmacy*  Follow-Up: At [facility], you and your health needs are our priority.  As part of our continuing mission to provide you with exceptional heart care, we have created designated Provider Care Teams.  These Care Teams include your primary Cardiologist (physician) and Advanced Practice Providers (APPs -  Physician Assistants and Nurse Practitioners) who all work together to provide you with the care you need, when you need it.  We recommend signing up for the patient portal called "MyChart".  Sign up information is provided on this After Visit Summary.  MyChart is used to connect with patients for Virtual Visits (Telemedicine).  Patients are able to view lab/test results, encounter notes, upcoming appointments, etc.  Non-urgent messages can be sent to your provider as well.   To learn more about what you can do with MyChart, go to [email or web address].    Your next appointment:   1 year(s)  Provider:   James Hochrein, MD     

## 2022-07-13 NOTE — Telephone Encounter (Signed)
Patient was calling to speak to the nurse. Please advise  

## 2022-07-13 NOTE — Therapy (Signed)
OUTPATIENT PHYSICAL THERAPY SHOULDER TREATMENT  Patient Name: Lori Soto MRN: [id number] DOB:1935-05-13, 87 y.o., female Today's Date: 07/13/2022  END OF SESSION:  PT End of Session - 07/13/22 1038     Visit Number 14    Number of Visits 16    Date for PT Re-Evaluation 08/23/22    Authorization Type FOTO AT LEAST EVERY 5TH VISIT.  PROGRESS NOTE AT 10TH VISIT.  KX MODIFIER AFTER 15 VISITS.    PT Start Time 1030    PT Stop Time 1122    PT Time Calculation (min) 52 min            Past Medical History:  Diagnosis Date   Anemia    Arthritis    Cancer ([place])    skin cancer on nose   Carpal tunnel syndrome, bilateral    Cataract    Esophagitis    GERD (gastroesophageal reflux disease)    H/O hiatal hernia    Osteopenia    Scoliosis    Shingles    SVT (supraventricular tachycardia)    Ulcerative colitis    Past Surgical History:  Procedure Laterality Date   ABDOMINAL HYSTERECTOMY     BUNIONECTOMY     Bilateral   CARPAL TUNNEL RELEASE Left 03/10/2015   Procedure: LEFT CARPAL TUNNEL RELEASE;  Surgeon: Daryll Brod, MD;  Location: [place];  Service: Orthopedics;  Laterality: Left;   CARPAL TUNNEL RELEASE Right 10/31/2017   Procedure: RIGHT CARPAL TUNNEL RELEASE;  Surgeon: Daryll Brod, MD;  Location: [place];  Service: Orthopedics;  Laterality: Right;   CATARACT EXTRACTION W/PHACO  05/28/2012   Procedure: CATARACT EXTRACTION PHACO AND INTRAOCULAR LENS PLACEMENT (IOC);  Surgeon: Tonny Branch, MD;  [facility];  Service: Ophthalmology;  Laterality: Right;  CDE=12.84   CATARACT EXTRACTION W/PHACO  06/07/2012   Procedure: CATARACT EXTRACTION PHACO AND INTRAOCULAR LENS PLACEMENT (IOC);  Surgeon: Tonny Branch, MD;  [facility];  Service: Ophthalmology;  Laterality: Left;  CDE 17.60   COLONOSCOPY     COLONOSCOPY N/A 07/08/2015   Procedure: COLONOSCOPY;  Surgeon: Rogene Houston, MD;  [facility];  Service: Endoscopy;  Laterality:  N/A;  930   ESOPHAGEAL DILATION N/A 07/08/2015   Procedure: ESOPHAGEAL DILATION;  Surgeon: Rogene Houston, MD;  [facility];  Service: Endoscopy;  Laterality: N/A;   ESOPHAGOGASTRODUODENOSCOPY N/A 07/08/2015   Procedure: ESOPHAGOGASTRODUODENOSCOPY (EGD);  Surgeon: Rogene Houston, MD;  [facility];  Service: Endoscopy;  Laterality: N/A;   Hernia     Right inguinal   HERNIA REPAIR  1961   right inguinal hernia   UPPER GASTROINTESTINAL ENDOSCOPY     Patient Active Problem List   Diagnosis Date Noted   History of supraventricular tachycardia 11/08/2021   Cough 05/29/2020   Chest congestion 05/29/2020   Arthritis 06/13/2019   GERD (gastroesophageal reflux disease) 06/10/2019   Esophageal stricture 06/10/2019   Educated about COVID-19 virus infection 06/04/2019   SVT (supraventricular tachycardia) 08/28/2018   Elevated troponin 08/28/2018   Light headedness 05/09/2018   Carpal tunnel syndrome of right wrist 05/20/2015   Primary localized osteoarthrosis, hand 05/20/2015   Carpal tunnel syndrome on left 03/18/2015   Osteopenia of the elderly 02/19/2014   DDD (degenerative disc disease), lumbar 12/03/2013   Scoliosis 12/03/2013   Chronic LBP 05/14/2013   IBS (irritable bowel syndrome) 11/08/2012   UC (ulcerative colitis) ([place]) 04/23/2012   Palpitation 10/26/2011   Hypertension 10/26/2011   Chronic ulcerative  colitis ([place]) 03/23/2011   REFERRING PROVIDER: Ronnie Doss DO  REFERRING DIAG: Chronic left shoulder pain.  THERAPY DIAG:  Acute pain of left shoulder  Stiffness of left shoulder, not elsewhere classified  Muscle weakness (generalized)  Rationale for Evaluation and Treatment: Rehabilitation  ONSET DATE: Several months.  SUBJECTIVE:                                                                                                                                                                                      SUBJECTIVE STATEMENT: LT shldr  doing much better  PERTINENT HISTORY: Chronic back pain.  PAIN:  Are you having pain? Yes: NPRS scale: 2/10 Pain location: Left shoulder Pain description: Ache, stiff and throbbing. Aggravating factors: Movement. Relieving factors: As above.  PRECAUTIONS: None  PATIENT GOALS:Use left shoulder without pain.  NEXT MD VISIT:   OBJECTIVE:   HEP:     [place] by Mali Applegate Jan 30th, 2024 View at [email or web address] using code: [email or web address]  Page 1 of 1  2 Exercises PECTORALIS CORNER STRETCH While standing at a corner of a wall, place your arms on the walls with elobws bent so that your upper arms are horizontal and your forearms are directed upwards as shown. Take one step forward towards the corner. Bend your front knee until a stretch is felt along the front of your chest and/or shoulders. Your arms should be pointed downward towards the ground. NOTE: Your legs should control the stretch by bending or straightening your front knee. Repeat 4 Times Hold 30 Seconds Complete 1 Set Perform 4 Times a Day Shoulder Flexion Wall Stretch Standing in front of the wall, walk your fingers up the wall until your arm is raised above your hand and you feel a slight stretch in the shoulder. Repeat 10 Times Hold 5 Seconds Complete 2 Sets Perform 4 Times a Day  TODAY'S TREATMENT:  DATE:                                    EXERCISE LOG   LT shldr               07-13-22  Pulleys  X 5 minutes  UBE X6 mins at90  RW3 Yellow band x20 reps  No ER due to pain  Shoulder adduction    Manual Therapy Soft Tissue Mobilization: L posterior shoulder and TPR to Uts and Levator , to reduce tightness    Modalities  HMP x 15 mins Unattended Estim: Shoulder, Pre-Mod, 15 mins, Pain  ASSESSMENT:  CLINICAL IMPRESSION:  FOTO  performed  Pt arrived today and reports that LT shldr continues to improve with less pain and improved function. ER not performed with Tband due to pain. No complaints with other exs.   OBJECTIVE IMPAIRMENTS: decreased activity tolerance, decreased ROM, decreased strength, increased muscle spasms, and pain.   ACTIVITY LIMITATIONS: carrying, lifting, dressing, and reach over head  PARTICIPATION LIMITATIONS: meal prep, cleaning, laundry, and driving  REHAB POTENTIAL: Good  CLINICAL DECISION MAKING: Stable/uncomplicated  EVALUATION COMPLEXITY: Low  GOALS:  SHORT TERM GOALS: Target date: 06/08/22  Ind with an HEP. Goal status: MET  LONG TERM GOALS: Target date: 08/24/22  Ind with advanced HEP. Goal status: IN PROGRESS  2.  Active left shoulder flexion to 145 degrees so the patient can easily reach overhead. Goal status: Partially met  130 degrees  3.  Active ER to 70 degrees+ to allow for easily donning/doffing of apparel. Goal status: Partially met  4.  Increase left shoulder strength to a solid 4+/5 to increase stability for performance of functional activities. Goal status: IN PROGRESS  5.  Perform ADL's with pain not > 3/10. Goal status: IN PROGRESS  PLAN:  PT FREQUENCY: 2x/week  PT DURATION: 6 weeks  PLANNED INTERVENTIONS: Therapeutic exercises, Therapeutic activity, Patient/Family education, Self Care, Dry Needling, Electrical stimulation, Cryotherapy, Moist heat, Vasopneumatic device, Ultrasound, and Manual therapy  PLAN FOR NEXT SESSION: Combo e'stim/US, dry needling, RW4, PROM.   Recert  Nathali Vent,CHRIS, PTA 07/13/2022, 12:19 PM

## 2022-07-13 NOTE — Telephone Encounter (Signed)
Lori Soto states when she was in the office today the nurse took her blood pressure in her right arm and her diastolic pressure higher than when she take it at home in her left arm. She was informed to take her blood pressure at home in both arms and call to give the nurse the readings. She stated she took her blood pressure in the left arm it was 137/74 and her heart rate was 54. In her right arm her reading was 138/85 and heart rate was 56. She stated she waited a while and repeated her blood pressure in both arms and the diastolic still had a difference of 10 in her right arm. She just called to report the readings to the nurse like she asked.

## 2022-07-14 NOTE — Telephone Encounter (Signed)
Minus Breeding, MD  You21 hours ago (4:48 PM)    OK.  No change in therapy.      Left message for pt on home voicemail of Dr Hochrein's comments.  No change in therapy at this time.  Requested she call back if any questions or concerns.

## 2022-07-15 ENCOUNTER — Ambulatory Visit: Payer: Medicare Other | Admitting: Physical Therapy

## 2022-07-15 DIAGNOSIS — M25512 Pain in left shoulder: Secondary | ICD-10-CM

## 2022-07-15 DIAGNOSIS — M6281 Muscle weakness (generalized): Secondary | ICD-10-CM | POA: Diagnosis not present

## 2022-07-15 DIAGNOSIS — M25612 Stiffness of left shoulder, not elsewhere classified: Secondary | ICD-10-CM

## 2022-07-15 NOTE — Therapy (Signed)
OUTPATIENT PHYSICAL THERAPY SHOULDER TREATMENT  Patient Name: Lori Soto MRN: [id number] DOB:01-Feb-1936, 87 y.o., female Today's Date: 07/15/2022  END OF SESSION:  PT End of Session - 07/15/22 1139     Visit Number 15    Number of Visits 16    Date for PT Re-Evaluation 08/23/22    Authorization Type FOTO AT LEAST EVERY 5TH VISIT.  PROGRESS NOTE AT 10TH VISIT.  KX MODIFIER AFTER 15 VISITS.    PT Start Time 1129    PT Stop Time 1225    PT Time Calculation (min) 56 min    Activity Tolerance Patient tolerated treatment well    Behavior During Therapy WFL for tasks assessed/performed            Past Medical History:  Diagnosis Date   Anemia    Arthritis    Cancer ([place])    skin cancer on nose   Carpal tunnel syndrome, bilateral    Cataract    Esophagitis    GERD (gastroesophageal reflux disease)    H/O hiatal hernia    Osteopenia    Scoliosis    Shingles    SVT (supraventricular tachycardia)    Ulcerative colitis    Past Surgical History:  Procedure Laterality Date   ABDOMINAL HYSTERECTOMY     BUNIONECTOMY     Bilateral   CARPAL TUNNEL RELEASE Left 03/10/2015   Procedure: LEFT CARPAL TUNNEL RELEASE;  Surgeon: Daryll Brod, MD;  Location: [place];  Service: Orthopedics;  Laterality: Left;   CARPAL TUNNEL RELEASE Right 10/31/2017   Procedure: RIGHT CARPAL TUNNEL RELEASE;  Surgeon: Daryll Brod, MD;  Location: [place];  Service: Orthopedics;  Laterality: Right;   CATARACT EXTRACTION W/PHACO  05/28/2012   Procedure: CATARACT EXTRACTION PHACO AND INTRAOCULAR LENS PLACEMENT (IOC);  Surgeon: Tonny Branch, MD;  [facility];  Service: Ophthalmology;  Laterality: Right;  CDE=12.84   CATARACT EXTRACTION W/PHACO  06/07/2012   Procedure: CATARACT EXTRACTION PHACO AND INTRAOCULAR LENS PLACEMENT (IOC);  Surgeon: Tonny Branch, MD;  [facility];  Service: Ophthalmology;  Laterality: Left;  CDE 17.60   COLONOSCOPY     COLONOSCOPY N/A 07/08/2015    Procedure: COLONOSCOPY;  Surgeon: Rogene Houston, MD;  [facility];  Service: Endoscopy;  Laterality: N/A;  930   ESOPHAGEAL DILATION N/A 07/08/2015   Procedure: ESOPHAGEAL DILATION;  Surgeon: Rogene Houston, MD;  [facility];  Service: Endoscopy;  Laterality: N/A;   ESOPHAGOGASTRODUODENOSCOPY N/A 07/08/2015   Procedure: ESOPHAGOGASTRODUODENOSCOPY (EGD);  Surgeon: Rogene Houston, MD;  [facility];  Service: Endoscopy;  Laterality: N/A;   Hernia     Right inguinal   HERNIA REPAIR  1961   right inguinal hernia   UPPER GASTROINTESTINAL ENDOSCOPY     Patient Active Problem List   Diagnosis Date Noted   History of supraventricular tachycardia 11/08/2021   Cough 05/29/2020   Chest congestion 05/29/2020   Arthritis 06/13/2019   GERD (gastroesophageal reflux disease) 06/10/2019   Esophageal stricture 06/10/2019   Educated about COVID-19 virus infection 06/04/2019   SVT (supraventricular tachycardia) 08/28/2018   Elevated troponin 08/28/2018   Light headedness 05/09/2018   Carpal tunnel syndrome of right wrist 05/20/2015   Primary localized osteoarthrosis, hand 05/20/2015   Carpal tunnel syndrome on left 03/18/2015   Osteopenia of the elderly 02/19/2014   DDD (degenerative disc disease), lumbar 12/03/2013   Scoliosis 12/03/2013   Chronic LBP 05/14/2013   IBS (irritable bowel syndrome) 11/08/2012  UC (ulcerative colitis) ([place]) 04/23/2012   Palpitation 10/26/2011   Hypertension 10/26/2011   Chronic ulcerative colitis ([place]) 03/23/2011   REFERRING PROVIDER: Ronnie Doss DO  REFERRING DIAG: Chronic left shoulder pain.  THERAPY DIAG:  Acute pain of left shoulder  Stiffness of left shoulder, not elsewhere classified  Muscle weakness (generalized)  Rationale for Evaluation and Treatment: Rehabilitation  ONSET DATE: Several months.  SUBJECTIVE:                                                                                                                                                                                       SUBJECTIVE STATEMENT: Overall, patient states her left shoulder is much better since starting PT.  PERTINENT HISTORY: Chronic back pain.  PAIN:  Are you having pain? Yes: NPRS scale: 3-4/10 Pain location: Left shoulder Pain description: Ache, stiff and throbbing. Aggravating factors: Movement. Relieving factors: As above.  PRECAUTIONS: None  PATIENT GOALS:Use left shoulder without pain.  NEXT MD VISIT:   OBJECTIVE:   HEP:     [place] by [place] Adonias Demore Jan 30th, 2024 View at [email or web address] using code: [email or web address]  Page 1 of 1  2 Exercises PECTORALIS CORNER STRETCH While standing at a corner of a wall, place your arms on the walls with elobws bent so that your upper arms are horizontal and your forearms are directed upwards as shown. Take one step forward towards the corner. Bend your front knee until a stretch is felt along the front of your chest and/or shoulders. Your arms should be pointed downward towards the ground. NOTE: Your legs should control the stretch by bending or straightening your front knee. Repeat 4 Times Hold 30 Seconds Complete 1 Set Perform 4 Times a Day Shoulder Flexion Wall Stretch Standing in front of the wall, walk your fingers up the wall until your arm is raised above your hand and you feel a slight stretch in the shoulder. Repeat 10 Times Hold 5 Seconds Complete 2 Sets Perform 4 Times a Day  TODAY'S TREATMENT:  DATE:                                    EXERCISE LOG   LT shldr               07-15-22  Pulleys  X 5 minutes  UBE X6 mins at 120 RPM's  RW4 Minimal range into ER      Manual Therapy Soft Tissue Mobilization: L posterior shoulder and TPR to Uts and Levator , to reduce tightness  x 10  minutes with ischemic release to decrease tone.  Modalities  HMP and IFC at 80-150 Hz on 40% scan x 20 minutes to patient's left posterior cuff musculature.   Patient tolerated treatment without complaint with normal modality response following removal of modality.   ASSESSMENT:  CLINICAL IMPRESSION:  Patient realized she has a habit of pushing with her left hand on counters and with her elbow on armrests to straighten her spine.  She was likely a major contributor to her shoulder pain.  Posterior cuff notable for trigger points with great response to STW/M.  Patient good after treatment.   OBJECTIVE IMPAIRMENTS: decreased activity tolerance, decreased ROM, decreased strength, increased muscle spasms, and pain.   ACTIVITY LIMITATIONS: carrying, lifting, dressing, and reach over head  PARTICIPATION LIMITATIONS: meal prep, cleaning, laundry, and driving  REHAB POTENTIAL: Good  CLINICAL DECISION MAKING: Stable/uncomplicated  EVALUATION COMPLEXITY: Low  GOALS:  SHORT TERM GOALS: Target date: 06/08/22  Ind with an HEP. Goal status: MET  LONG TERM GOALS: Target date: 08/24/22  Ind with advanced HEP. Goal status: IN PROGRESS  2.  Active left shoulder flexion to 145 degrees so the patient can easily reach overhead. Goal status: Partially met  130 degrees  3.  Active ER to 70 degrees+ to allow for easily donning/doffing of apparel. Goal status: Partially met  4.  Increase left shoulder strength to a solid 4+/5 to increase stability for performance of functional activities. Goal status: IN PROGRESS  5.  Perform ADL's with pain not > 3/10. Goal status: IN PROGRESS  PLAN:  PT FREQUENCY: 2x/week  PT DURATION: 6 weeks  PLANNED INTERVENTIONS: Therapeutic exercises, Therapeutic activity, Patient/Family education, Self Care, Dry Needling, Electrical stimulation, Cryotherapy, Moist heat, Vasopneumatic device, Ultrasound, and Manual therapy  PLAN FOR NEXT SESSION: One visit  remaining.   Shevonne Wolf, [place], PT 07/15/2022, 12:49 PM

## 2022-07-20 DIAGNOSIS — I471 Supraventricular tachycardia, unspecified: Secondary | ICD-10-CM | POA: Diagnosis not present

## 2022-07-26 ENCOUNTER — Ambulatory Visit: Payer: Medicare Other | Admitting: Physical Therapy

## 2022-07-26 DIAGNOSIS — M25612 Stiffness of left shoulder, not elsewhere classified: Secondary | ICD-10-CM | POA: Diagnosis not present

## 2022-07-26 DIAGNOSIS — M25512 Pain in left shoulder: Secondary | ICD-10-CM

## 2022-07-26 DIAGNOSIS — M6281 Muscle weakness (generalized): Secondary | ICD-10-CM | POA: Diagnosis not present

## 2022-07-26 NOTE — Therapy (Addendum)
OUTPATIENT PHYSICAL THERAPY SHOULDER TREATMENT  Patient Name: Lori Soto MRN: [id number] DOB:01-Feb-1936, 87 y.o., female Today's Date: 07/15/2022  END OF SESSION:  PT End of Session - 07/15/22 1139     Visit Number 15    Number of Visits 16    Date for PT Re-Evaluation 08/23/22    Authorization Type FOTO AT LEAST EVERY 5TH VISIT.  PROGRESS NOTE AT 10TH VISIT.  KX MODIFIER AFTER 15 VISITS.    PT Start Time 1129    PT Stop Time 1225    PT Time Calculation (min) 56 min    Activity Tolerance Patient tolerated treatment well    Behavior During Therapy WFL for tasks assessed/performed            Past Medical History:  Diagnosis Date   Anemia    Arthritis    Cancer ([place])    skin cancer on nose   Carpal tunnel syndrome, bilateral    Cataract    Esophagitis    GERD (gastroesophageal reflux disease)    H/O hiatal hernia    Osteopenia    Scoliosis    Shingles    SVT (supraventricular tachycardia)    Ulcerative colitis    Past Surgical History:  Procedure Laterality Date   ABDOMINAL HYSTERECTOMY     BUNIONECTOMY     Bilateral   CARPAL TUNNEL RELEASE Left 03/10/2015   Procedure: LEFT CARPAL TUNNEL RELEASE;  Surgeon: Daryll Brod, MD;  Location: [place];  Service: Orthopedics;  Laterality: Left;   CARPAL TUNNEL RELEASE Right 10/31/2017   Procedure: RIGHT CARPAL TUNNEL RELEASE;  Surgeon: Daryll Brod, MD;  Location: [place];  Service: Orthopedics;  Laterality: Right;   CATARACT EXTRACTION W/PHACO  05/28/2012   Procedure: CATARACT EXTRACTION PHACO AND INTRAOCULAR LENS PLACEMENT (IOC);  Surgeon: Tonny Branch, MD;  [facility];  Service: Ophthalmology;  Laterality: Right;  CDE=12.84   CATARACT EXTRACTION W/PHACO  06/07/2012   Procedure: CATARACT EXTRACTION PHACO AND INTRAOCULAR LENS PLACEMENT (IOC);  Surgeon: Tonny Branch, MD;  [facility];  Service: Ophthalmology;  Laterality: Left;  CDE 17.60   COLONOSCOPY     COLONOSCOPY N/A 07/08/2015    Procedure: COLONOSCOPY;  Surgeon: Rogene Houston, MD;  [facility];  Service: Endoscopy;  Laterality: N/A;  930   ESOPHAGEAL DILATION N/A 07/08/2015   Procedure: ESOPHAGEAL DILATION;  Surgeon: Rogene Houston, MD;  [facility];  Service: Endoscopy;  Laterality: N/A;   ESOPHAGOGASTRODUODENOSCOPY N/A 07/08/2015   Procedure: ESOPHAGOGASTRODUODENOSCOPY (EGD);  Surgeon: Rogene Houston, MD;  [facility];  Service: Endoscopy;  Laterality: N/A;   Hernia     Right inguinal   HERNIA REPAIR  1961   right inguinal hernia   UPPER GASTROINTESTINAL ENDOSCOPY     Patient Active Problem List   Diagnosis Date Noted   History of supraventricular tachycardia 11/08/2021   Cough 05/29/2020   Chest congestion 05/29/2020   Arthritis 06/13/2019   GERD (gastroesophageal reflux disease) 06/10/2019   Esophageal stricture 06/10/2019   Educated about COVID-19 virus infection 06/04/2019   SVT (supraventricular tachycardia) 08/28/2018   Elevated troponin 08/28/2018   Light headedness 05/09/2018   Carpal tunnel syndrome of right wrist 05/20/2015   Primary localized osteoarthrosis, hand 05/20/2015   Carpal tunnel syndrome on left 03/18/2015   Osteopenia of the elderly 02/19/2014   DDD (degenerative disc disease), lumbar 12/03/2013   Scoliosis 12/03/2013   Chronic LBP 05/14/2013   IBS (irritable bowel syndrome) 11/08/2012  UC (ulcerative colitis) ([place]) 04/23/2012   Palpitation 10/26/2011   Hypertension 10/26/2011   Chronic ulcerative colitis ([place]) 03/23/2011   REFERRING PROVIDER: Ronnie Doss DO  REFERRING DIAG: Chronic left shoulder pain.  THERAPY DIAG:  Acute pain of left shoulder  Stiffness of left shoulder, not elsewhere classified  Muscle weakness (generalized)  Rationale for Evaluation and Treatment: Rehabilitation  ONSET DATE: Several months.  SUBJECTIVE:                                                                                                                                                                                       SUBJECTIVE STATEMENT: Have been on vacation.  Right shoulder flared-up PERTINENT HISTORY: Chronic back pain.  PAIN:  Are you having pain? Yes: NPRS scale: 8/10 Pain location: Left shoulder Pain description: Ache, stiff and throbbing. Aggravating factors: Movement. Relieving factors: As above.  PRECAUTIONS: None  PATIENT GOALS:Use left shoulder without pain.  NEXT MD VISIT:   OBJECTIVE:   HEP:     [place] by [place] Hamid Brookens Jan 30th, 2024 View at [email or web address] using code: [email or web address]  Page 1 of 1  2 Exercises PECTORALIS CORNER STRETCH While standing at a corner of a wall, place your arms on the walls with elobws bent so that your upper arms are horizontal and your forearms are directed upwards as shown. Take one step forward towards the corner. Bend your front knee until a stretch is felt along the front of your chest and/or shoulders. Your arms should be pointed downward towards the ground. NOTE: Your legs should control the stretch by bending or straightening your front knee. Repeat 4 Times Hold 30 Seconds Complete 1 Set Perform 4 Times a Day Shoulder Flexion Wall Stretch Standing in front of the wall, walk your fingers up the wall until your arm is raised above your hand and you feel a slight stretch in the shoulder. Repeat 10 Times Hold 5 Seconds Complete 2 Sets Perform 4 Times a Day  TODAY'S TREATMENT:  DATE:                                    EXERCISE LOG   LT shldr               07-26-22  UBE X8 mins at 120 RPM's        Trigger Point Dry-Needling  Treatment instructions: Expect mild to moderate muscle soreness. S/S of pneumothorax if dry needled over a lung field, and to seek immediate medical attention should  they occur. Patient verbalized understanding of these instructions and education.  Patient Consent Given: Yes Education handout provided: Yes Muscles treated: Right posterior cuff musculature  F/b STW/M x 15 minutes f/b HMP and IFC at 80-150 Hz on 40% scan x 15 minutes to patient's left posterior cuff musculature.   Patient tolerated treatment without complaint with normal modality response following removal of modality.   ASSESSMENT:  CLINICAL IMPRESSION:  Flare-up of right shoulder pain with referred pain into Tricep region.  Patient tolerated dry needling without complaint.  She stated her shoulder "feels much better" after treatment.   OBJECTIVE IMPAIRMENTS: decreased activity tolerance, decreased ROM, decreased strength, increased muscle spasms, and pain.   ACTIVITY LIMITATIONS: carrying, lifting, dressing, and reach over head  PARTICIPATION LIMITATIONS: meal prep, cleaning, laundry, and driving  REHAB POTENTIAL: Good  CLINICAL DECISION MAKING: Stable/uncomplicated  EVALUATION COMPLEXITY: Low  GOALS:  SHORT TERM GOALS: Target date: 06/08/22  Ind with an HEP. Goal status: MET  LONG TERM GOALS: Target date: 08/24/22  Ind with advanced HEP. Goal status: IN PROGRESS  2.  Active left shoulder flexion to 145 degrees so the patient can easily reach overhead. Goal status: Partially met  130 degrees  3.  Active ER to 70 degrees+ to allow for easily donning/doffing of apparel. Goal status: Partially met  4.  Increase left shoulder strength to a solid 4+/5 to increase stability for performance of functional activities. Goal status: IN PROGRESS  5.  Perform ADL's with pain not > 3/10. Goal status: IN PROGRESS  PLAN:  PT FREQUENCY: 2x/week  PT DURATION: 6 weeks  PLANNED INTERVENTIONS: Therapeutic exercises, Therapeutic activity, Patient/Family education, Self Care, Dry Needling, Electrical stimulation, Cryotherapy, Moist heat, Vasopneumatic device, Ultrasound, and Manual  therapy  PLAN FOR NEXT SESSION: One visit remaining.   Labrandon Knoch, [place], PT 07/26/22

## 2022-08-03 ENCOUNTER — Telehealth: Payer: Self-pay | Admitting: Family Medicine

## 2022-08-03 ENCOUNTER — Ambulatory Visit: Payer: Medicare Other | Attending: Family Medicine | Admitting: Physical Therapy

## 2022-08-03 DIAGNOSIS — M5441 Lumbago with sciatica, right side: Secondary | ICD-10-CM | POA: Insufficient documentation

## 2022-08-03 DIAGNOSIS — M25512 Pain in left shoulder: Secondary | ICD-10-CM | POA: Diagnosis not present

## 2022-08-03 DIAGNOSIS — G8929 Other chronic pain: Secondary | ICD-10-CM | POA: Insufficient documentation

## 2022-08-03 DIAGNOSIS — M5459 Other low back pain: Secondary | ICD-10-CM | POA: Insufficient documentation

## 2022-08-03 DIAGNOSIS — R293 Abnormal posture: Secondary | ICD-10-CM | POA: Insufficient documentation

## 2022-08-03 DIAGNOSIS — M62830 Muscle spasm of back: Secondary | ICD-10-CM | POA: Insufficient documentation

## 2022-08-03 DIAGNOSIS — M25612 Stiffness of left shoulder, not elsewhere classified: Secondary | ICD-10-CM | POA: Insufficient documentation

## 2022-08-03 DIAGNOSIS — M6281 Muscle weakness (generalized): Secondary | ICD-10-CM | POA: Insufficient documentation

## 2022-08-03 NOTE — Telephone Encounter (Signed)
Patient is seeing [place] Applegate for her shoulder and would like for him to help her with her back, she said that she was told she would need her PCP to put in a referral before he would be able to help her. Stated that it had been discussed with PCP. Please call back to let patient know when this is done or if it is not going to be and she needs an appt.

## 2022-08-03 NOTE — Telephone Encounter (Signed)
done

## 2022-08-03 NOTE — Therapy (Signed)
OUTPATIENT PHYSICAL THERAPY SHOULDER TREATMENT  Patient Name: Lori Soto MRN: [id number] DOB:07-05-1935, 87 y.o., female Today's Date: 08/03/2022  END OF SESSION:  PT End of Session - 08/03/22 1243     Visit Number 17    Number of Visits 20    Date for PT Re-Evaluation 08/23/22    Authorization Type FOTO AT LEAST EVERY 5TH VISIT.  PROGRESS NOTE AT 10TH VISIT.  KX MODIFIER AFTER 15 VISITS.    PT Start Time 1115    PT Stop Time 1212    PT Time Calculation (min) 57 min    Activity Tolerance Patient tolerated treatment well    Behavior During Therapy WFL for tasks assessed/performed            Past Medical History:  Diagnosis Date   Anemia    Arthritis    Cancer ([place])    skin cancer on nose   Carpal tunnel syndrome, bilateral    Cataract    Esophagitis    GERD (gastroesophageal reflux disease)    H/O hiatal hernia    Osteopenia    Scoliosis    Shingles    SVT (supraventricular tachycardia)    Ulcerative colitis    Past Surgical History:  Procedure Laterality Date   ABDOMINAL HYSTERECTOMY     BUNIONECTOMY     Bilateral   CARPAL TUNNEL RELEASE Left 03/10/2015   Procedure: LEFT CARPAL TUNNEL RELEASE;  Surgeon: Daryll Brod, MD;  Location: [place];  Service: Orthopedics;  Laterality: Left;   CARPAL TUNNEL RELEASE Right 10/31/2017   Procedure: RIGHT CARPAL TUNNEL RELEASE;  Surgeon: Daryll Brod, MD;  Location: [place];  Service: Orthopedics;  Laterality: Right;   CATARACT EXTRACTION W/PHACO  05/28/2012   Procedure: CATARACT EXTRACTION PHACO AND INTRAOCULAR LENS PLACEMENT (IOC);  Surgeon: Tonny Branch, MD;  Location: [facility];  Service: Ophthalmology;  Laterality: Right;  CDE=12.84   CATARACT EXTRACTION W/PHACO  06/07/2012   Procedure: CATARACT EXTRACTION PHACO AND INTRAOCULAR LENS PLACEMENT (IOC);  Surgeon: Tonny Branch, MD;  Location: [facility];  Service: Ophthalmology;  Laterality: Left;  CDE 17.60   COLONOSCOPY     COLONOSCOPY N/A 07/08/2015    Procedure: COLONOSCOPY;  Surgeon: Rogene Houston, MD;  [facility];  Service: Endoscopy;  Laterality: N/A;  930   ESOPHAGEAL DILATION N/A 07/08/2015   Procedure: ESOPHAGEAL DILATION;  Surgeon: Rogene Houston, MD;  [facility];  Service: Endoscopy;  Laterality: N/A;   ESOPHAGOGASTRODUODENOSCOPY N/A 07/08/2015   Procedure: ESOPHAGOGASTRODUODENOSCOPY (EGD);  Surgeon: Rogene Houston, MD;  [facility];  Service: Endoscopy;  Laterality: N/A;   Hernia     Right inguinal   HERNIA REPAIR  1961   right inguinal hernia   UPPER GASTROINTESTINAL ENDOSCOPY     Patient Active Problem List   Diagnosis Date Noted   History of supraventricular tachycardia 11/08/2021   Cough 05/29/2020   Chest congestion 05/29/2020   Arthritis 06/13/2019   GERD (gastroesophageal reflux disease) 06/10/2019   Esophageal stricture 06/10/2019   Educated about COVID-19 virus infection 06/04/2019   SVT (supraventricular tachycardia) 08/28/2018   Elevated troponin 08/28/2018   Light headedness 05/09/2018   Carpal tunnel syndrome of right wrist 05/20/2015   Primary localized osteoarthrosis, hand 05/20/2015   Carpal tunnel syndrome on left 03/18/2015   Osteopenia of the elderly 02/19/2014   DDD (degenerative disc disease), lumbar 12/03/2013   Scoliosis 12/03/2013   Chronic LBP 05/14/2013   IBS (irritable bowel syndrome) 11/08/2012  UC (ulcerative colitis) 04/23/2012   Palpitation 10/26/2011   Hypertension 10/26/2011   Chronic ulcerative colitis 03/23/2011   REFERRING PROVIDER: Ronnie Doss DO  REFERRING DIAG: Chronic left shoulder pain.  THERAPY DIAG:  Acute pain of left shoulder  Stiffness of left shoulder, not elsewhere classified  Rationale for Evaluation and Treatment: Rehabilitation  ONSET DATE: Several months.  SUBJECTIVE:                                                                                                                                                                                       SUBJECTIVE STATEMENT: Great response to dry needling.  No pain complaints today. PERTINENT HISTORY: Chronic back pain.  PAIN:  Are you having pain? Yes: NPRS scale: 0/10 Pain location: Left shoulder Pain description: Ache, stiff and throbbing. Aggravating factors: Movement. Relieving factors: As above.  PRECAUTIONS: None  PATIENT GOALS:Use left shoulder without pain.  NEXT MD VISIT:   OBJECTIVE:    TODAY'S TREATMENT:                                                                                                                                         DATE:    UBE x 10 minutes f/b                               Treatment instructions: Expect mild to moderate muscle soreness. S/S of pneumothorax if dry needled over a lung field, and to seek immediate medical attention should they occur. Patient verbalized understanding of these instructions and education.  Patient Consent Given: Yes Education handout provided: Yes Muscles treated: Right posterior cuff musculature  F/b STW/M x 13 minutes f/b HMP and IFC at 80-150 Hz on 40% scan x 15 minutes to patient's left posterior cuff musculature.   Patient tolerated treatment without complaint with normal modality response following removal of modality.   ASSESSMENT:  CLINICAL IMPRESSION:  Great response to last session with no left shoulder pain reported upon presentation to the clinic  today.  She does, however, have some remaining palpable tenderness in her posterior cuff and middle deltoid region.  Excellent twitch response to dry needling today.  She is going to consult with her doctor regarding treatment to low back and perhaps knees.    OBJECTIVE IMPAIRMENTS: decreased activity tolerance, decreased ROM, decreased strength, increased muscle spasms, and pain.   ACTIVITY LIMITATIONS: carrying, lifting, dressing, and reach over head  PARTICIPATION LIMITATIONS: meal prep, cleaning, laundry, and  driving  REHAB POTENTIAL: Good  CLINICAL DECISION MAKING: Stable/uncomplicated  EVALUATION COMPLEXITY: Low  GOALS:  SHORT TERM GOALS: Target date: 06/08/22  Ind with an HEP. Goal status: MET  LONG TERM GOALS: Target date: 08/24/22  Ind with advanced HEP. Goal status: IN PROGRESS  2.  Active left shoulder flexion to 145 degrees so the patient can easily reach overhead. Goal status: Partially met  130 degrees  3.  Active ER to 70 degrees+ to allow for easily donning/doffing of apparel. Goal status: Partially met  4.  Increase left shoulder strength to a solid 4+/5 to increase stability for performance of functional activities. Goal status: IN PROGRESS  5.  Perform ADL's with pain not > 3/10. Goal status: IN PROGRESS  PLAN:  PT FREQUENCY: 2x/week  PT DURATION: 6 weeks  PLANNED INTERVENTIONS: Therapeutic exercises, Therapeutic activity, Patient/Family education, Self Care, Dry Needling, Electrical stimulation, Cryotherapy, Moist heat, Vasopneumatic device, Ultrasound, and Manual therapy  PLAN FOR NEXT SESSION: One visit remaining.   Kaamil Morefield, [place], PT 08/03/2022, 12:53 PM

## 2022-08-05 ENCOUNTER — Ambulatory Visit: Payer: Medicare Other | Admitting: Physical Therapy

## 2022-08-05 DIAGNOSIS — M62830 Muscle spasm of back: Secondary | ICD-10-CM | POA: Diagnosis not present

## 2022-08-05 DIAGNOSIS — M5441 Lumbago with sciatica, right side: Secondary | ICD-10-CM | POA: Diagnosis not present

## 2022-08-05 DIAGNOSIS — M25612 Stiffness of left shoulder, not elsewhere classified: Secondary | ICD-10-CM | POA: Diagnosis not present

## 2022-08-05 DIAGNOSIS — M25512 Pain in left shoulder: Secondary | ICD-10-CM

## 2022-08-05 DIAGNOSIS — G8929 Other chronic pain: Secondary | ICD-10-CM | POA: Diagnosis not present

## 2022-08-05 DIAGNOSIS — R293 Abnormal posture: Secondary | ICD-10-CM | POA: Diagnosis not present

## 2022-08-05 NOTE — Therapy (Signed)
OUTPATIENT PHYSICAL THERAPY SHOULDER TREATMENT  Patient Name: Lori Soto MRN: [id number] DOB:10-11-1935, 87 y.o., female Today's Date: 08/05/2022  END OF SESSION:  PT End of Session - 08/05/22 1228     Visit Number 18    Number of Visits 20    Date for PT Re-Evaluation 08/23/22    Authorization Type FOTO AT LEAST EVERY 5TH VISIT.  PROGRESS NOTE AT 10TH VISIT.  KX MODIFIER AFTER 15 VISITS.    PT Start Time 1115    PT Stop Time 1214    PT Time Calculation (min) 59 min    Activity Tolerance Patient tolerated treatment well    Behavior During Therapy WFL for tasks assessed/performed            Past Medical History:  Diagnosis Date   Anemia    Arthritis    Cancer ([facility])    skin cancer on nose   Carpal tunnel syndrome, bilateral    Cataract    Esophagitis    GERD (gastroesophageal reflux disease)    H/O hiatal hernia    Osteopenia    Scoliosis    Shingles    SVT (supraventricular tachycardia)    Ulcerative colitis    Past Surgical History:  Procedure Laterality Date   ABDOMINAL HYSTERECTOMY     BUNIONECTOMY     Bilateral   CARPAL TUNNEL RELEASE Left 03/10/2015   Procedure: LEFT CARPAL TUNNEL RELEASE;  Surgeon: Cindee SaltGary Kuzma, MD;  Location: [facility];  Service: Orthopedics;  Laterality: Left;   CARPAL TUNNEL RELEASE Right 10/31/2017   Procedure: RIGHT CARPAL TUNNEL RELEASE;  Surgeon: Cindee SaltKuzma, Gary, MD;  Location: [facility];  Service: Orthopedics;  Laterality: Right;   CATARACT EXTRACTION W/PHACO  05/28/2012   Procedure: CATARACT EXTRACTION PHACO AND INTRAOCULAR LENS PLACEMENT (IOC);  Surgeon: Gemma PayorKerry Hunt, MD;  Location: [facility];  Service: Ophthalmology;  Laterality: Right;  CDE=12.84   CATARACT EXTRACTION W/PHACO  06/07/2012   Procedure: CATARACT EXTRACTION PHACO AND INTRAOCULAR LENS PLACEMENT (IOC);  Surgeon: Gemma PayorKerry Hunt, MD;  Location: [facility];  Service: Ophthalmology;  Laterality: Left;  CDE 17.60   COLONOSCOPY     COLONOSCOPY N/A 07/08/2015    Procedure: COLONOSCOPY;  Surgeon: Malissa HippoNajeeb U Rehman, MD;  [facility];  Service: Endoscopy;  Laterality: N/A;  930   ESOPHAGEAL DILATION N/A 07/08/2015   Procedure: ESOPHAGEAL DILATION;  Surgeon: Malissa HippoNajeeb U Rehman, MD;  [facility];  Service: Endoscopy;  Laterality: N/A;   ESOPHAGOGASTRODUODENOSCOPY N/A 07/08/2015   Procedure: ESOPHAGOGASTRODUODENOSCOPY (EGD);  Surgeon: Malissa HippoNajeeb U Rehman, MD;  [facility];  Service: Endoscopy;  Laterality: N/A;   Hernia     Right inguinal   HERNIA REPAIR  1961   right inguinal hernia   UPPER GASTROINTESTINAL ENDOSCOPY     Patient Active Problem List   Diagnosis Date Noted   History of supraventricular tachycardia 11/08/2021   Cough 05/29/2020   Chest congestion 05/29/2020   Arthritis 06/13/2019   GERD (gastroesophageal reflux disease) 06/10/2019   Esophageal stricture 06/10/2019   Educated about COVID-19 virus infection 06/04/2019   SVT (supraventricular tachycardia) 08/28/2018   Elevated troponin 08/28/2018   Light headedness 05/09/2018   Carpal tunnel syndrome of right wrist 05/20/2015   Primary localized osteoarthrosis, hand 05/20/2015   Carpal tunnel syndrome on left 03/18/2015   Osteopenia of the elderly 02/19/2014   DDD (degenerative disc disease), lumbar 12/03/2013   Scoliosis 12/03/2013   Chronic LBP 05/14/2013   IBS (irritable bowel syndrome) 11/08/2012  UC (ulcerative colitis) 04/23/2012   Palpitation 10/26/2011   Hypertension 10/26/2011   Chronic ulcerative colitis 03/23/2011   REFERRING PROVIDER: Delynn Flavin DO  REFERRING DIAG: Chronic left shoulder pain.  THERAPY DIAG:  Acute pain of left shoulder  Stiffness of left shoulder, not elsewhere classified  Rationale for Evaluation and Treatment: Rehabilitation  ONSET DATE: Several months.  SUBJECTIVE:                                                                                                                                                                                       SUBJECTIVE STATEMENT: Great response to dry needling.  No pain complaints today. PERTINENT HISTORY: Chronic back pain.  PAIN:  Are you having pain? Yes: NPRS scale: 0/10 Pain location: Left shoulder Pain description: Ache, stiff and throbbing. Aggravating factors: Movement. Relieving factors: As above.  PRECAUTIONS: None  PATIENT GOALS:Use left shoulder without pain.  NEXT MD VISIT:   OBJECTIVE:    TODAY'S TREATMENT:                                                                                                                                         DATE:    UBE x 10 minutes f/b                                 F/b STW/M x 13 minutes f/b HMP and IFC at 80-150 Hz on 40% scan x 20 minutes to patient's left posterior cuff/middle deltoid musculature.   Patient tolerated treatment without complaint with normal modality response following removal of modality.   ASSESSMENT:  CLINICAL IMPRESSION:  No left shoulder pain reported today with less pain into Triceps.  She was tender to palpation over her left Levator Scapulae and had a good response to STW/M, ischemic release technique.    OBJECTIVE IMPAIRMENTS: decreased activity tolerance, decreased ROM, decreased strength, increased muscle spasms, and pain.   ACTIVITY LIMITATIONS: carrying, lifting, dressing, and reach over head  PARTICIPATION LIMITATIONS: meal  prep, cleaning, laundry, and driving  REHAB POTENTIAL: Good  CLINICAL DECISION MAKING: Stable/uncomplicated  EVALUATION COMPLEXITY: Low  GOALS:  SHORT TERM GOALS: Target date: 06/08/22  Ind with an HEP. Goal status: MET  LONG TERM GOALS: Target date: 08/24/22  Ind with advanced HEP. Goal status: IN PROGRESS  2.  Active left shoulder flexion to 145 degrees so the patient can easily reach overhead. Goal status: Partially met  130 degrees  3.  Active ER to 70 degrees+ to allow for easily donning/doffing of apparel. Goal  status: Partially met  4.  Increase left shoulder strength to a solid 4+/5 to increase stability for performance of functional activities. Goal status: IN PROGRESS  5.  Perform ADL's with pain not > 3/10. Goal status: IN PROGRESS  PLAN:  PT FREQUENCY: 2x/week  PT DURATION: 6 weeks  PLANNED INTERVENTIONS: Therapeutic exercises, Therapeutic activity, Patient/Family education, Self Care, Dry Needling, Electrical stimulation, Cryotherapy, Moist heat, Vasopneumatic device, Ultrasound, and Manual therapy  PLAN FOR NEXT SESSION: One visit remaining.   Fergie Sherbert, [place], PT 08/05/2022, 12:49 PM

## 2022-08-09 ENCOUNTER — Encounter: Payer: Self-pay | Admitting: Physical Therapy

## 2022-08-09 ENCOUNTER — Ambulatory Visit: Payer: Medicare Other | Admitting: Physical Therapy

## 2022-08-09 DIAGNOSIS — G8929 Other chronic pain: Secondary | ICD-10-CM | POA: Diagnosis not present

## 2022-08-09 DIAGNOSIS — M25512 Pain in left shoulder: Secondary | ICD-10-CM | POA: Diagnosis not present

## 2022-08-09 DIAGNOSIS — M6281 Muscle weakness (generalized): Secondary | ICD-10-CM

## 2022-08-09 DIAGNOSIS — R293 Abnormal posture: Secondary | ICD-10-CM | POA: Diagnosis not present

## 2022-08-09 DIAGNOSIS — M5441 Lumbago with sciatica, right side: Secondary | ICD-10-CM | POA: Diagnosis not present

## 2022-08-09 DIAGNOSIS — M25612 Stiffness of left shoulder, not elsewhere classified: Secondary | ICD-10-CM | POA: Diagnosis not present

## 2022-08-09 DIAGNOSIS — M62830 Muscle spasm of back: Secondary | ICD-10-CM | POA: Diagnosis not present

## 2022-08-09 NOTE — Therapy (Signed)
OUTPATIENT PHYSICAL THERAPY SHOULDER TREATMENT  Patient Name: Lori Soto MRN: [id number] DOB:[birth year], 87 y.o., female Today's Date: 08/09/2022  END OF SESSION:  PT End of Session - 08/09/22 1443     Visit Number 19    Number of Visits 20    Date for PT Re-Evaluation 08/23/22    Authorization Type FOTO AT LEAST EVERY 5TH VISIT.  PROGRESS NOTE AT 10TH VISIT.  KX MODIFIER AFTER 15 VISITS.    PT Start Time 1435    PT Stop Time 1518    PT Time Calculation (min) 43 min    Activity Tolerance Patient tolerated treatment well    Behavior During Therapy WFL for tasks assessed/performed            Past Medical History:  Diagnosis Date   Anemia    Arthritis    Cancer    skin cancer on nose   Carpal tunnel syndrome, bilateral    Cataract    Esophagitis    GERD (gastroesophageal reflux disease)    H/O hiatal hernia    Osteopenia    Scoliosis    Shingles    SVT (supraventricular tachycardia)    Ulcerative colitis    Past Surgical History:  Procedure Laterality Date   ABDOMINAL HYSTERECTOMY     BUNIONECTOMY     Bilateral   CARPAL TUNNEL RELEASE Left 03/10/2015   Procedure: LEFT CARPAL TUNNEL RELEASE;  Surgeon: Cindee SaltGary Kuzma, MD;  Location: [facility];  Service: Orthopedics;  Laterality: Left;   CARPAL TUNNEL RELEASE Right 10/31/2017   Procedure: RIGHT CARPAL TUNNEL RELEASE;  Surgeon: Cindee SaltKuzma, Gary, MD;  [facility];  Service: Orthopedics;  Laterality: Right;   CATARACT EXTRACTION W/PHACO  05/28/2012   Procedure: CATARACT EXTRACTION PHACO AND INTRAOCULAR LENS PLACEMENT (IOC);  Surgeon: Gemma PayorKerry Hunt, MD;  [facility];  Service: Ophthalmology;  Laterality: Right;  CDE=12.84   CATARACT EXTRACTION W/PHACO  06/07/2012   Procedure: CATARACT EXTRACTION PHACO AND INTRAOCULAR LENS PLACEMENT (IOC);  Surgeon: Gemma PayorKerry Hunt, MD;  Location: [facility];  Service: Ophthalmology;  Laterality: Left;  CDE 17.60   COLONOSCOPY     COLONOSCOPY N/A 07/08/2015    Procedure: COLONOSCOPY;  Surgeon: Malissa HippoNajeeb U Rehman, MD;  [facility];  Service: Endoscopy;  Laterality: N/A;  930   ESOPHAGEAL DILATION N/A 07/08/2015   Procedure: ESOPHAGEAL DILATION;  Surgeon: Malissa HippoNajeeb U Rehman, MD;  [facility];  Service: Endoscopy;  Laterality: N/A;   ESOPHAGOGASTRODUODENOSCOPY N/A 07/08/2015   Procedure: ESOPHAGOGASTRODUODENOSCOPY (EGD);  Surgeon: Malissa HippoNajeeb U Rehman, MD;  [facility];  Service: Endoscopy;  Laterality: N/A;   Hernia     Right inguinal   HERNIA REPAIR  1961   right inguinal hernia   UPPER GASTROINTESTINAL ENDOSCOPY     Patient Active Problem List   Diagnosis Date Noted   History of supraventricular tachycardia 11/08/2021   Cough 05/29/2020   Chest congestion 05/29/2020   Arthritis 06/13/2019   GERD (gastroesophageal reflux disease) 06/10/2019   Esophageal stricture 06/10/2019   Educated about COVID-19 virus infection 06/04/2019   SVT (supraventricular tachycardia) 08/28/2018   Elevated troponin 08/28/2018   Light headedness 05/09/2018   Carpal tunnel syndrome of right wrist 05/20/2015   Primary localized osteoarthrosis, hand 05/20/2015   Carpal tunnel syndrome on left 03/18/2015   Osteopenia of the elderly 02/19/2014   DDD (degenerative disc disease), lumbar 12/03/2013   Scoliosis 12/03/2013   Chronic LBP 05/14/2013   IBS (irritable bowel syndrome) 11/08/2012  UC (ulcerative colitis) 04/23/2012   Palpitation 10/26/2011   Hypertension 10/26/2011   Chronic ulcerative colitis 03/23/2011   REFERRING PROVIDER: Delynn Flavin DO  REFERRING DIAG: Chronic left shoulder pain.  THERAPY DIAG:  Acute pain of left shoulder  Stiffness of left shoulder, not elsewhere classified  Muscle weakness (generalized)  Rationale for Evaluation and Treatment: Rehabilitation  ONSET DATE: Several months.  SUBJECTIVE:                                                                                                                                                                                       SUBJECTIVE STATEMENT: More relief in UT but some pain still in posterior LUE.  PERTINENT HISTORY: Chronic back pain.  PAIN:  Are you having pain? Yes: NPRS scale: 0/10 Pain location: Left shoulder Pain description: Ache, stiff and throbbing. Aggravating factors: Movement. Relieving factors: As above.  PRECAUTIONS: None  PATIENT GOALS:Use left shoulder without pain.  NEXT MD VISIT:   OBJECTIVE:    TODAY'S TREATMENT:                                                                                                                                         DATE: 08/09/2022  EXERCISE LOG  Exercise Repetitions and Resistance Comments  UBE 120 RPM x10 min    Blank cell = exercise not performed today   Modalities  Date: 08/09/2022 Unattended Estim: Cervical, IFC, 10 mins, Pain and Tone Ultrasound: Cervical, 1.5 w/cm2, 100%, 1 mhz, 10 mins, Pain and Tone  Manual Therapy Soft Tissue Mobilization: L UT, to reduce TP in mid L UT     ASSESSMENT:  CLINICAL IMPRESSION:    Patient presented in clinic with reports of tone of the L UT although better since DN. Patient reported pain still in L triceps area. Increased tone of the L UT palpable with tenderness reported via patient. Normal modalities response noted following removal of the modalities.  OBJECTIVE IMPAIRMENTS: decreased activity tolerance, decreased ROM, decreased strength, increased muscle spasms, and pain.   ACTIVITY LIMITATIONS: carrying, lifting,  dressing, and reach over head  PARTICIPATION LIMITATIONS: meal prep, cleaning, laundry, and driving  REHAB POTENTIAL: Good  CLINICAL DECISION MAKING: Stable/uncomplicated  EVALUATION COMPLEXITY: Low  GOALS:  SHORT TERM GOALS: Target date: 06/08/22  Ind with an HEP. Goal status: MET  LONG TERM GOALS: Target date: 08/24/22  Ind with advanced HEP. Goal status: IN PROGRESS  2.  Active left shoulder flexion  to 145 degrees so the patient can easily reach overhead. Goal status: Partially met  130 degrees  3.  Active ER to 70 degrees+ to allow for easily donning/doffing of apparel. Goal status: Partially met  4.  Increase left shoulder strength to a solid 4+/5 to increase stability for performance of functional activities. Goal status: IN PROGRESS  5.  Perform ADL's with pain not > 3/10. Goal status: IN PROGRESS  PLAN:  PT FREQUENCY: 2x/week  PT DURATION: 6 weeks  PLANNED INTERVENTIONS: Therapeutic exercises, Therapeutic activity, Patient/Family education, Self Care, Dry Needling, Electrical stimulation, Cryotherapy, Moist heat, Vasopneumatic device, Ultrasound, and Manual therapy  PLAN FOR NEXT SESSION: One visit remaining.   Marvell Fuller, PTA 08/09/2022, 4:34 PM

## 2022-08-11 ENCOUNTER — Encounter: Payer: Medicare Other | Admitting: Physical Therapy

## 2022-08-12 ENCOUNTER — Ambulatory Visit: Payer: Medicare Other | Admitting: Physical Therapy

## 2022-08-12 DIAGNOSIS — R293 Abnormal posture: Secondary | ICD-10-CM | POA: Diagnosis not present

## 2022-08-12 DIAGNOSIS — M62830 Muscle spasm of back: Secondary | ICD-10-CM

## 2022-08-12 DIAGNOSIS — M5441 Lumbago with sciatica, right side: Secondary | ICD-10-CM | POA: Diagnosis not present

## 2022-08-12 DIAGNOSIS — M25512 Pain in left shoulder: Secondary | ICD-10-CM

## 2022-08-12 DIAGNOSIS — M25612 Stiffness of left shoulder, not elsewhere classified: Secondary | ICD-10-CM

## 2022-08-12 DIAGNOSIS — M5459 Other low back pain: Secondary | ICD-10-CM

## 2022-08-12 DIAGNOSIS — G8929 Other chronic pain: Secondary | ICD-10-CM | POA: Diagnosis not present

## 2022-08-12 NOTE — Therapy (Signed)
OUTPATIENT PHYSICAL THERAPY SHOULDER TREATMENT  Patient Name: Lori Soto MRN: [id number] DOB:03/02/1936, 87 y.o., female Today's Date: 08/12/2022  END OF SESSION:  PT End of Session - 08/12/22 1302     Visit Number 20    Number of Visits 28    Date for PT Re-Evaluation 09/06/22    Authorization Type FOTO AT LEAST EVERY 5TH VISIT.  PROGRESS NOTE AT 10TH VISIT.  KX MODIFIER AFTER 15 VISITS.    PT Start Time 1120    PT Stop Time 1222    PT Time Calculation (min) 62 min    Activity Tolerance Patient tolerated treatment well    Behavior During Therapy WFL for tasks assessed/performed            Past Medical History:  Diagnosis Date   Anemia    Arthritis    Cancer    skin cancer on nose   Carpal tunnel syndrome, bilateral    Cataract    Esophagitis    GERD (gastroesophageal reflux disease)    H/O hiatal hernia    Osteopenia    Scoliosis    Shingles    SVT (supraventricular tachycardia)    Ulcerative colitis    Past Surgical History:  Procedure Laterality Date   ABDOMINAL HYSTERECTOMY     BUNIONECTOMY     Bilateral   CARPAL TUNNEL RELEASE Left 03/10/2015   Procedure: LEFT CARPAL TUNNEL RELEASE;  Surgeon: Cindee Salt, MD;  Location: [facility];  Service: Orthopedics;  Laterality: Left;   CARPAL TUNNEL RELEASE Right 10/31/2017   Procedure: RIGHT CARPAL TUNNEL RELEASE;  Surgeon: Cindee Salt, MD;  Location: [facility];  Service: Orthopedics;  Laterality: Right;   CATARACT EXTRACTION W/PHACO  05/28/2012   Procedure: CATARACT EXTRACTION PHACO AND INTRAOCULAR LENS PLACEMENT (IOC);  Surgeon: Gemma Payor, MD;  Location: [facility];  Service: Ophthalmology;  Laterality: Right;  CDE=12.84   CATARACT EXTRACTION W/PHACO  06/07/2012   Procedure: CATARACT EXTRACTION PHACO AND INTRAOCULAR LENS PLACEMENT (IOC);  Surgeon: Gemma Payor, MD;  Location: [facility];  Service: Ophthalmology;  Laterality: Left;  CDE 17.60   COLONOSCOPY     COLONOSCOPY N/A 07/08/2015    Procedure: COLONOSCOPY;  Surgeon: Malissa Hippo, MD;  [facility];  Service: Endoscopy;  Laterality: N/A;  930   ESOPHAGEAL DILATION N/A 07/08/2015   Procedure: ESOPHAGEAL DILATION;  Surgeon: Malissa Hippo, MD;  [facility]: [facility];  Service: Endoscopy;  Laterality: N/A;   ESOPHAGOGASTRODUODENOSCOPY N/A 07/08/2015   Procedure: ESOPHAGOGASTRODUODENOSCOPY (EGD);  Surgeon: Malissa Hippo, MD;  [facility]: [facility];  Service: Endoscopy;  Laterality: N/A;   Hernia     Right inguinal   HERNIA REPAIR  1961   right inguinal hernia   UPPER GASTROINTESTINAL ENDOSCOPY     Patient Active Problem List   Diagnosis Date Noted   History of supraventricular tachycardia 11/08/2021   Cough 05/29/2020   Chest congestion 05/29/2020   Arthritis 06/13/2019   GERD (gastroesophageal reflux disease) 06/10/2019   Esophageal stricture 06/10/2019   Educated about COVID-19 virus infection 06/04/2019   SVT (supraventricular tachycardia) 08/28/2018   Elevated troponin 08/28/2018   Light headedness 05/09/2018   Carpal tunnel syndrome of right wrist 05/20/2015   Primary localized osteoarthrosis, hand 05/20/2015   Carpal tunnel syndrome on left 03/18/2015   Osteopenia of the elderly 02/19/2014   DDD (degenerative disc disease), lumbar 12/03/2013   Scoliosis 12/03/2013   Chronic LBP 05/14/2013   IBS (irritable bowel syndrome) 11/08/2012  UC (ulcerative colitis) 04/23/2012   Palpitation 10/26/2011   Hypertension 10/26/2011   Chronic ulcerative colitis 03/23/2011   REFERRING PROVIDER: Delynn Flavin DO  REFERRING DIAG: Chronic left shoulder pain.  THERAPY DIAG:  Acute pain of left shoulder - Plan: PT plan of care cert/re-cert  Stiffness of left shoulder, not elsewhere classified - Plan: PT plan of care cert/re-cert  Abnormal posture - Plan: PT plan of care cert/re-cert  Other low back pain - Plan: PT plan of care cert/re-cert  Muscle spasm of back - Plan: PT plan of care  cert/re-cert  Rationale for Evaluation and Treatment: Rehabilitation  ONSET DATE: Several months.  SUBJECTIVE:                                                                                                                                                                                      SUBJECTIVE STATEMENT: See clinical impression statement. PERTINENT HISTORY: Chronic back pain.  PAIN:  Are you having pain? See clinical impression statement.  PRECAUTIONS: None  PATIENT GOALS:Use left shoulder without pain.  NEXT MD VISIT:   TODAY's TX:  Lumbar assessment.  HMP x 15 minutes to patient's low back while receiving IFC at 80-150 on 40% scan x 15 minutes to her right proximal to middle region of Tricep f/b Intermittent lumbar traction at 65 # (99 sec on and 5 sec off) x 20 minutes.  Great tolerance for traction feeling better afterward.    ASSESSMENT:  CLINICAL IMPRESSION:    The patient presents to the clinic stating her left shoulder is "100% better" since beginning PT with some remaining residual pain in her left Triceps.  She has a new referral for chronic right-sided low back pain.  When she got up this morning her pain was a 9.  After driving to the clinic with heated seats her pain went down to a 4-5/10.  Her pain can reach severe levels with too much activity.  She has responded very favorably to lumbo-pelvic traction in the past.  The patient's posture is remarkable for a left trunk lean, significant scoliosis with a large convexity on the right in her lumbar region.  She has full active trunk flexion and lumbar extension limited to 0 degrees.  She also presents with thoracic kyphosis, decreased lumbar lordosis and left knee genu valgum.  She has severe palpable tone over her right lumbar region but her CC of pain is over her right SIJ.  Her leg lengths are equal and she has no pain with SLR and FABER testing.  Her LE strength is normal.    OBJECTIVE IMPAIRMENTS: decreased  activity tolerance, decreased ROM, decreased strength, increased muscle spasms,  and pain.   ACTIVITY LIMITATIONS: carrying, lifting, dressing, and reach over head  PARTICIPATION LIMITATIONS: meal prep, cleaning, laundry, and driving  REHAB POTENTIAL: Good  CLINICAL DECISION MAKING: Stable/uncomplicated  EVALUATION COMPLEXITY: Low  GOALS:  SHORT TERM GOALS: Target date: 06/08/22  Ind with an HEP. Goal status: MET  LONG TERM GOALS: Target date: 08/24/22  Ind with advanced HEP. Goal status: IN PROGRESS  2.  Active left shoulder flexion to 145 degrees so the patient can easily reach overhead. Goal status: Partially met  130 degrees  3.  Active ER to 70 degrees+ to allow for easily donning/doffing of apparel. Goal status: Partially met  4.  Increase left shoulder strength to a solid 4+/5 to increase stability for performance of functional activities. Goal status: IN PROGRESS  5.  Perform ADL's with pain not > 3/10. Goal status: MET (08/12/22).  6.  Perform ADL's with right-sided low back pain not > 4-5.  PLAN:  PT FREQUENCY: 2x/week  PT DURATION: 6 weeks  PLANNED INTERVENTIONS: Therapeutic exercises, Therapeutic activity, Patient/Family education, Self Care, Dry Needling, Electrical stimulation, Cryotherapy, Moist heat, Vasopneumatic device, Ultrasound, and Manual therapy  PLAN FOR NEXT SESSION: STW/M to left post cuff and Tricep region, please check shoulder goals.  STW/M to patient's right low back and SIJ region.  Intermittent lumbar traction at 75#.   Jaqwon Manfred, [place], PT 08/12/2022, 1:25 PM

## 2022-08-14 ENCOUNTER — Other Ambulatory Visit: Payer: Self-pay | Admitting: Cardiology

## 2022-08-16 ENCOUNTER — Ambulatory Visit: Payer: Medicare Other | Admitting: Physical Therapy

## 2022-08-16 DIAGNOSIS — M25512 Pain in left shoulder: Secondary | ICD-10-CM | POA: Diagnosis not present

## 2022-08-16 DIAGNOSIS — M62830 Muscle spasm of back: Secondary | ICD-10-CM | POA: Diagnosis not present

## 2022-08-16 DIAGNOSIS — G8929 Other chronic pain: Secondary | ICD-10-CM | POA: Diagnosis not present

## 2022-08-16 DIAGNOSIS — R293 Abnormal posture: Secondary | ICD-10-CM | POA: Diagnosis not present

## 2022-08-16 DIAGNOSIS — M5441 Lumbago with sciatica, right side: Secondary | ICD-10-CM | POA: Diagnosis not present

## 2022-08-16 DIAGNOSIS — M25612 Stiffness of left shoulder, not elsewhere classified: Secondary | ICD-10-CM | POA: Diagnosis not present

## 2022-08-16 NOTE — Therapy (Signed)
OUTPATIENT PHYSICAL THERAPY SHOULDER TREATMENT  Patient Name: Kirra Verga MRN: [id number] DOB:06-19-1935, 87 y.o., female Today's Date: 08/16/2022  END OF SESSION:  PT End of Session - 08/16/22 1719     Visit Number 21    Number of Visits 28    Date for PT Re-Evaluation 09/06/22    Authorization Type FOTO AT LEAST EVERY 5TH VISIT.  PROGRESS NOTE AT 10TH VISIT.  KX MODIFIER AFTER 15 VISITS.    PT Start Time 0445    PT Stop Time 0530    PT Time Calculation (min) 45 min    Activity Tolerance Patient tolerated treatment well    Behavior During Therapy WFL for tasks assessed/performed             Past Medical History:  Diagnosis Date   Anemia    Arthritis    Cancer    skin cancer on nose   Carpal tunnel syndrome, bilateral    Cataract    Esophagitis    GERD (gastroesophageal reflux disease)    H/O hiatal hernia    Osteopenia    Scoliosis    Shingles    SVT (supraventricular tachycardia)    Ulcerative colitis    Past Surgical History:  Procedure Laterality Date   ABDOMINAL HYSTERECTOMY     BUNIONECTOMY     Bilateral   CARPAL TUNNEL RELEASE Left 03/10/2015   Procedure: LEFT CARPAL TUNNEL RELEASE;  Surgeon: Cindee Salt, MD;  Location: [facility];  Service: Orthopedics;  Laterality: Left;   CARPAL TUNNEL RELEASE Right 10/31/2017   Procedure: RIGHT CARPAL TUNNEL RELEASE;  Surgeon: Cindee Salt, MD;  Location: [facility];  Service: Orthopedics;  Laterality: Right;   CATARACT EXTRACTION W/PHACO  05/28/2012   Procedure: CATARACT EXTRACTION PHACO AND INTRAOCULAR LENS PLACEMENT (IOC);  Surgeon: Gemma Payor, MD;  Location: [facility];  Service: Ophthalmology;  Laterality: Right;  CDE=12.84   CATARACT EXTRACTION W/PHACO  06/07/2012   Procedure: CATARACT EXTRACTION PHACO AND INTRAOCULAR LENS PLACEMENT (IOC);  Surgeon: Gemma Payor, MD;  Location: [facility];  Service: Ophthalmology;  Laterality: Left;  CDE 17.60   COLONOSCOPY     COLONOSCOPY N/A 07/08/2015    Procedure: COLONOSCOPY;  Surgeon: Malissa Hippo, MD;  [facility];  Service: Endoscopy;  Laterality: N/A;  930   ESOPHAGEAL DILATION N/A 07/08/2015   Procedure: ESOPHAGEAL DILATION;  Surgeon: Malissa Hippo, MD;  [facility];  Service: Endoscopy;  Laterality: N/A;   ESOPHAGOGASTRODUODENOSCOPY N/A 07/08/2015   Procedure: ESOPHAGOGASTRODUODENOSCOPY (EGD);  Surgeon: Malissa Hippo, MD;  [facility]: [facility];  Service: Endoscopy;  Laterality: N/A;   Hernia     Right inguinal   HERNIA REPAIR  1961   right inguinal hernia   UPPER GASTROINTESTINAL ENDOSCOPY     Patient Active Problem List   Diagnosis Date Noted   History of supraventricular tachycardia 11/08/2021   Cough 05/29/2020   Chest congestion 05/29/2020   Arthritis 06/13/2019   GERD (gastroesophageal reflux disease) 06/10/2019   Esophageal stricture 06/10/2019   Educated about COVID-19 virus infection 06/04/2019   SVT (supraventricular tachycardia) 08/28/2018   Elevated troponin 08/28/2018   Light headedness 05/09/2018   Carpal tunnel syndrome of right wrist 05/20/2015   Primary localized osteoarthrosis, hand 05/20/2015   Carpal tunnel syndrome on left 03/18/2015   Osteopenia of the elderly 02/19/2014   DDD (degenerative disc disease), lumbar 12/03/2013   Scoliosis 12/03/2013   Chronic LBP 05/14/2013   IBS (irritable bowel syndrome) 11/08/2012  UC (ulcerative colitis) 04/23/2012   Palpitation 10/26/2011   Hypertension 10/26/2011   Chronic ulcerative colitis 03/23/2011   REFERRING PROVIDER: Delynn Flavin DO  REFERRING DIAG: Chronic left shoulder pain.  THERAPY DIAG:  Acute pain of left shoulder  Muscle spasm of back  Chronic right-sided low back pain with right-sided sciatica  Rationale for Evaluation and Treatment: Rehabilitation  ONSET DATE: Several months.  SUBJECTIVE:                                                                                                                                                                                       SUBJECTIVE STATEMENT: Traction helped. PERTINENT HISTORY: Chronic back pain.  PAIN:  Are you having pain?   PRECAUTIONS: None  PATIENT GOALS:Use left shoulder without pain.  NEXT MD VISIT:   TODAY's TX:  Nustep on level 3 x 10 minutes f/b  Intermittent lumbar traction at 75 # (99 sec on and 5 sec off) x 15 minutes while receiving IFC to left Tricep region x 15 minutes.  Great tolerance for traction feeling better afterward.    ASSESSMENT:  CLINICAL IMPRESSION:    Patient tolerated a 10# increase in intermittent lumbar traction without complaint.  OBJECTIVE IMPAIRMENTS: decreased activity tolerance, decreased ROM, decreased strength, increased muscle spasms, and pain.   ACTIVITY LIMITATIONS: carrying, lifting, dressing, and reach over head  PARTICIPATION LIMITATIONS: meal prep, cleaning, laundry, and driving  REHAB POTENTIAL: Good  CLINICAL DECISION MAKING: Stable/uncomplicated  EVALUATION COMPLEXITY: Low  GOALS:  SHORT TERM GOALS: Target date: 06/08/22  Ind with an HEP. Goal status: MET  LONG TERM GOALS: Target date: 08/24/22  Ind with advanced HEP. Goal status: IN PROGRESS  2.  Active left shoulder flexion to 145 degrees so the patient can easily reach overhead. Goal status: Partially met  130 degrees  3.  Active ER to 70 degrees+ to allow for easily donning/doffing of apparel. Goal status: Partially met  4.  Increase left shoulder strength to a solid 4+/5 to increase stability for performance of functional activities. Goal status: IN PROGRESS  5.  Perform ADL's with pain not > 3/10. Goal status: MET (08/12/22).  6.  Perform ADL's with right-sided low back pain not > 4-5.  PLAN:  PT FREQUENCY: 2x/week  PT DURATION: 6 weeks  PLANNED INTERVENTIONS: Therapeutic exercises, Therapeutic activity, Patient/Family education, Self Care, Dry Needling, Electrical stimulation, Cryotherapy, Moist heat,  Vasopneumatic device, Ultrasound, and Manual therapy  PLAN FOR NEXT SESSION: STW/M to left post cuff and Tricep region, please check shoulder goals.  STW/M to patient's right low back and SIJ region.  Intermittent lumbar traction at 75#.   Zyaire Dumas, [place], PT 08/16/2022,  5:44 PM

## 2022-08-23 ENCOUNTER — Ambulatory Visit: Payer: Medicare Other | Admitting: *Deleted

## 2022-08-23 ENCOUNTER — Encounter: Payer: Self-pay | Admitting: *Deleted

## 2022-08-23 DIAGNOSIS — M62830 Muscle spasm of back: Secondary | ICD-10-CM

## 2022-08-23 DIAGNOSIS — M25612 Stiffness of left shoulder, not elsewhere classified: Secondary | ICD-10-CM | POA: Diagnosis not present

## 2022-08-23 DIAGNOSIS — G8929 Other chronic pain: Secondary | ICD-10-CM

## 2022-08-23 DIAGNOSIS — M5441 Lumbago with sciatica, right side: Secondary | ICD-10-CM | POA: Diagnosis not present

## 2022-08-23 DIAGNOSIS — M25512 Pain in left shoulder: Secondary | ICD-10-CM

## 2022-08-23 DIAGNOSIS — R293 Abnormal posture: Secondary | ICD-10-CM | POA: Diagnosis not present

## 2022-08-23 NOTE — Therapy (Signed)
OUTPATIENT PHYSICAL THERAPY SHOULDER TREATMENT  Patient Name: Lori Soto MRN: [id number] DOB:09-13-1935, 87 y.o., female Today's Date: 08/23/2022  END OF SESSION:  PT End of Session - 08/23/22 1440     Visit Number 22    Number of Visits 28    Date for PT Re-Evaluation 09/06/22    Authorization Type FOTO AT LEAST EVERY 5TH VISIT.  PROGRESS NOTE AT 10TH VISIT.  KX MODIFIER AFTER 15 VISITS.    PT Start Time 1430    PT Stop Time 1520    PT Time Calculation (min) 50 min             Past Medical History:  Diagnosis Date   Anemia    Arthritis    Cancer    skin cancer on nose   Carpal tunnel syndrome, bilateral    Cataract    Esophagitis    GERD (gastroesophageal reflux disease)    H/O hiatal hernia    Osteopenia    Scoliosis    Shingles    SVT (supraventricular tachycardia)    Ulcerative colitis    Past Surgical History:  Procedure Laterality Date   ABDOMINAL HYSTERECTOMY     BUNIONECTOMY     Bilateral   CARPAL TUNNEL RELEASE Left 03/10/2015   Procedure: LEFT CARPAL TUNNEL RELEASE;  Surgeon: Cindee Salt, MD;  Location: [facility];  Service: Orthopedics;  Laterality: Left;   CARPAL TUNNEL RELEASE Right 10/31/2017   Procedure: RIGHT CARPAL TUNNEL RELEASE;  Surgeon: Cindee Salt, MD;  Location: [facility];  Service: Orthopedics;  Laterality: Right;   CATARACT EXTRACTION W/PHACO  05/28/2012   Procedure: CATARACT EXTRACTION PHACO AND INTRAOCULAR LENS PLACEMENT (IOC);  Surgeon: Gemma Payor, MD;  Location: [facility];  Service: Ophthalmology;  Laterality: Right;  CDE=12.84   CATARACT EXTRACTION W/PHACO  06/07/2012   Procedure: CATARACT EXTRACTION PHACO AND INTRAOCULAR LENS PLACEMENT (IOC);  Surgeon: Gemma Payor, MD;  Location: [facility];  Service: Ophthalmology;  Laterality: Left;  CDE 17.60   COLONOSCOPY     COLONOSCOPY N/A 07/08/2015   Procedure: COLONOSCOPY;  Surgeon: Malissa Hippo, MD;  [facility];  Service: Endoscopy;  Laterality: N/A;   930   ESOPHAGEAL DILATION N/A 07/08/2015   Procedure: ESOPHAGEAL DILATION;  Surgeon: Malissa Hippo, MD;  [facility];  Service: Endoscopy;  Laterality: N/A;   ESOPHAGOGASTRODUODENOSCOPY N/A 07/08/2015   Procedure: ESOPHAGOGASTRODUODENOSCOPY (EGD);  Surgeon: Malissa Hippo, MD;  [facility];  Service: Endoscopy;  Laterality: N/A;   Hernia     Right inguinal   HERNIA REPAIR  1961   right inguinal hernia   UPPER GASTROINTESTINAL ENDOSCOPY     Patient Active Problem List   Diagnosis Date Noted   History of supraventricular tachycardia 11/08/2021   Cough 05/29/2020   Chest congestion 05/29/2020   Arthritis 06/13/2019   GERD (gastroesophageal reflux disease) 06/10/2019   Esophageal stricture 06/10/2019   Educated about COVID-19 virus infection 06/04/2019   SVT (supraventricular tachycardia) 08/28/2018   Elevated troponin 08/28/2018   Light headedness 05/09/2018   Carpal tunnel syndrome of right wrist 05/20/2015   Primary localized osteoarthrosis, hand 05/20/2015   Carpal tunnel syndrome on left 03/18/2015   Osteopenia of the elderly 02/19/2014   DDD (degenerative disc disease), lumbar 12/03/2013   Scoliosis 12/03/2013   Chronic LBP 05/14/2013   IBS (irritable bowel syndrome) 11/08/2012   UC (ulcerative colitis) 04/23/2012   Palpitation 10/26/2011   Hypertension 10/26/2011   Chronic ulcerative colitis  03/23/2011   REFERRING PROVIDER: Delynn Flavin DO  REFERRING DIAG: Chronic left shoulder pain.  THERAPY DIAG:  Muscle spasm of back  Acute pain of left shoulder  Chronic right-sided low back pain with right-sided sciatica  Rationale for Evaluation and Treatment: Rehabilitation  ONSET DATE: Several months.  SUBJECTIVE:                                                                                                                                                                                      SUBJECTIVE STATEMENT: Did good after last  Rx   PERTINENT HISTORY: Chronic back pain.  PAIN:  Are you having pain?   PRECAUTIONS: None  PATIENT GOALS:Use left shoulder without pain.  NEXT MD VISIT:   TODAY's TX:  UBE x 10 minutes f/b  Intermittent lumbar traction at 75 # (99 sec on and 5 sec off) x 15 minutes while receiving Premod to left Tricep region x 15 minutes.  Great tolerance for traction feeling better afterward.    ASSESSMENT:  CLINICAL IMPRESSION:    Patient arrived today reporting doing very well after last Rx. She did very well again with Pelvic traction at 75#s and therex for Lt shldr and tricep.Estim tolerated well to LT tricep with only mild soreness after session    OBJECTIVE IMPAIRMENTS: decreased activity tolerance, decreased ROM, decreased strength, increased muscle spasms, and pain.   ACTIVITY LIMITATIONS: carrying, lifting, dressing, and reach over head  PARTICIPATION LIMITATIONS: meal prep, cleaning, laundry, and driving  REHAB POTENTIAL: Good  CLINICAL DECISION MAKING: Stable/uncomplicated  EVALUATION COMPLEXITY: Low  GOALS:  SHORT TERM GOALS: Target date: 06/08/22  Ind with an HEP. Goal status: MET  LONG TERM GOALS: Target date: 08/24/22  Ind with advanced HEP. Goal status: IN PROGRESS  2.  Active left shoulder flexion to 145 degrees so the patient can easily reach overhead. Goal status: Partially met  130 degrees  3.  Active ER to 70 degrees+ to allow for easily donning/doffing of apparel. Goal status: Partially met  4.  Increase left shoulder strength to a solid 4+/5 to increase stability for performance of functional activities. Goal status: IN PROGRESS  5.  Perform ADL's with pain not > 3/10. Goal status: MET (08/12/22).  6.  Perform ADL's with right-sided low back pain not > 4-5.  PLAN:  PT FREQUENCY: 2x/week  PT DURATION: 6 weeks  PLANNED INTERVENTIONS: Therapeutic exercises, Therapeutic activity, Patient/Family education, Self Care, Dry Needling, Electrical  stimulation, Cryotherapy, Moist heat, Vasopneumatic device, Ultrasound, and Manual therapy  PLAN FOR NEXT SESSION: STW/M to left post cuff and Tricep region, please check shoulder goals.  STW/M to patient's right low back and  SIJ region.  Intermittent lumbar traction at 75#.   Darcel Frane,CHRIS, PTA 08/23/2022, 5:32 PM

## 2022-08-30 ENCOUNTER — Encounter: Payer: Self-pay | Admitting: *Deleted

## 2022-08-30 ENCOUNTER — Ambulatory Visit: Payer: Medicare Other | Admitting: *Deleted

## 2022-08-30 DIAGNOSIS — M25612 Stiffness of left shoulder, not elsewhere classified: Secondary | ICD-10-CM | POA: Diagnosis not present

## 2022-08-30 DIAGNOSIS — M25512 Pain in left shoulder: Secondary | ICD-10-CM | POA: Diagnosis not present

## 2022-08-30 DIAGNOSIS — R293 Abnormal posture: Secondary | ICD-10-CM | POA: Diagnosis not present

## 2022-08-30 DIAGNOSIS — G8929 Other chronic pain: Secondary | ICD-10-CM | POA: Diagnosis not present

## 2022-08-30 DIAGNOSIS — M5441 Lumbago with sciatica, right side: Secondary | ICD-10-CM | POA: Diagnosis not present

## 2022-08-30 DIAGNOSIS — M62830 Muscle spasm of back: Secondary | ICD-10-CM

## 2022-08-30 NOTE — Therapy (Signed)
OUTPATIENT PHYSICAL THERAPY SHOULDER TREATMENT  Patient Name: Mickey Hebel MRN: [id number] DOB:Aug 15, 1935, 87 y.o., female Today's Date: 08/30/2022  END OF SESSION:  PT End of Session - 08/30/22 1256     Visit Number 23    Number of Visits 28    Date for PT Re-Evaluation 09/06/22    Authorization Type FOTO AT LEAST EVERY 5TH VISIT.  PROGRESS NOTE AT 10TH VISIT.  KX MODIFIER AFTER 15 VISITS.    PT Start Time 1300    PT Stop Time 1351    PT Time Calculation (min) 51 min             Past Medical History:  Diagnosis Date   Anemia    Arthritis    Cancer (HCC)    skin cancer on nose   Carpal tunnel syndrome, bilateral    Cataract    Esophagitis    GERD (gastroesophageal reflux disease)    H/O hiatal hernia    Osteopenia    Scoliosis    Shingles    SVT (supraventricular tachycardia)    Ulcerative colitis    Past Surgical History:  Procedure Laterality Date   ABDOMINAL HYSTERECTOMY     BUNIONECTOMY     Bilateral   CARPAL TUNNEL RELEASE Left 03/10/2015   Procedure: LEFT CARPAL TUNNEL RELEASE;  Surgeon: Cindee Salt, MD;  Location: [facility];  Service: Orthopedics;  Laterality: Left;   CARPAL TUNNEL RELEASE Right 10/31/2017   Procedure: RIGHT CARPAL TUNNEL RELEASE;  Surgeon: Cindee Salt, MD;  Location: [facility];  Service: Orthopedics;  Laterality: Right;   CATARACT EXTRACTION W/PHACO  05/28/2012   Procedure: CATARACT EXTRACTION PHACO AND INTRAOCULAR LENS PLACEMENT (IOC);  Surgeon: Gemma Payor, MD;  Location: [facility];  Service: Ophthalmology;  Laterality: Right;  CDE=12.84   CATARACT EXTRACTION W/PHACO  06/07/2012   Procedure: CATARACT EXTRACTION PHACO AND INTRAOCULAR LENS PLACEMENT (IOC);  Surgeon: Gemma Payor, MD;  Location: [facility];  Service: Ophthalmology;  Laterality: Left;  CDE 17.60   COLONOSCOPY     COLONOSCOPY N/A 07/08/2015   Procedure: COLONOSCOPY;  Surgeon: Malissa Hippo, MD;  Location: [facility];  Service: Endoscopy;  Laterality:  N/A;  930   ESOPHAGEAL DILATION N/A 07/08/2015   Procedure: ESOPHAGEAL DILATION;  Surgeon: Malissa Hippo, MD;  Location: [facility];  Service: Endoscopy;  Laterality: N/A;   ESOPHAGOGASTRODUODENOSCOPY N/A 07/08/2015   Procedure: ESOPHAGOGASTRODUODENOSCOPY (EGD);  Surgeon: Malissa Hippo, MD;  Location: [facility];  Service: Endoscopy;  Laterality: N/A;   Hernia     Right inguinal   HERNIA REPAIR  1961   right inguinal hernia   UPPER GASTROINTESTINAL ENDOSCOPY     Patient Active Problem List   Diagnosis Date Noted   History of supraventricular tachycardia 11/08/2021   Cough 05/29/2020   Chest congestion 05/29/2020   Arthritis 06/13/2019   GERD (gastroesophageal reflux disease) 06/10/2019   Esophageal stricture 06/10/2019   Educated about COVID-19 virus infection 06/04/2019   SVT (supraventricular tachycardia) 08/28/2018   Elevated troponin 08/28/2018   Light headedness 05/09/2018   Carpal tunnel syndrome of right wrist 05/20/2015   Primary localized osteoarthrosis, hand 05/20/2015   Carpal tunnel syndrome on left 03/18/2015   Osteopenia of the elderly 02/19/2014   DDD (degenerative disc disease), lumbar 12/03/2013   Scoliosis 12/03/2013   Chronic LBP 05/14/2013   IBS (irritable bowel syndrome) 11/08/2012   UC (ulcerative colitis) (HCC) 04/23/2012   Palpitation 10/26/2011   Hypertension 10/26/2011   Chronic  ulcerative colitis (HCC) 03/23/2011   REFERRING PROVIDER: Delynn Flavin DO  REFERRING DIAG: Chronic left shoulder pain.  THERAPY DIAG:  Muscle spasm of back  Acute pain of left shoulder  Rationale for Evaluation and Treatment: Rehabilitation  ONSET DATE: Several months.  SUBJECTIVE:                                                                                                                                                                                      SUBJECTIVE STATEMENT: Did good after last Rx.  Tricep pain 2/10, LBP 3/10 so far  today   PERTINENT HISTORY: Chronic back pain.  PAIN:  Are you having pain?   PRECAUTIONS: None  PATIENT GOALS:Use left shoulder without pain.  NEXT MD VISIT:   TODAY's TX:                              08-30-22   UBE x 10 minutes    Intermittent lumbar traction at 75 # (99 sec on and 5 sec off) x 15 minutes while receiving Premod to left Tricep region x 15 minutes.  Great tolerance for traction feeling better afterward.   DN was also performed by [place] Applegate MPT. ASSESSMENT:  CLINICAL IMPRESSION:    Patient arrived today reporting doing very well after last Rx. She did very well again with Pelvic traction at 75#s and therex for Lt shldr and tricep. Her pains were low today in her shldr/tricep as well as LB. Estim tolerated well to LT tricep with only mild soreness after session    OBJECTIVE IMPAIRMENTS: decreased activity tolerance, decreased ROM, decreased strength, increased muscle spasms, and pain.   ACTIVITY LIMITATIONS: carrying, lifting, dressing, and reach over head  PARTICIPATION LIMITATIONS: meal prep, cleaning, laundry, and driving  REHAB POTENTIAL: Good  CLINICAL DECISION MAKING: Stable/uncomplicated  EVALUATION COMPLEXITY: Low  GOALS:  SHORT TERM GOALS: Target date: 06/08/22  Ind with an HEP. Goal status: MET  LONG TERM GOALS: Target date: 08/24/22  Ind with advanced HEP. Goal status: IN PROGRESS  2.  Active left shoulder flexion to 145 degrees so the patient can easily reach overhead. Goal status: Partially met  130 degrees  3.  Active ER to 70 degrees+ to allow for easily donning/doffing of apparel. Goal status: Partially met  4.  Increase left shoulder strength to a solid 4+/5 to increase stability for performance of functional activities. Goal status: IN PROGRESS  5.  Perform ADL's with pain not > 3/10. Goal status: MET (08/12/22).  6.  Perform ADL's with right-sided low back pain not > 4-5.  PLAN:  PT FREQUENCY: 2x/week  PT DURATION:  6 weeks  PLANNED INTERVENTIONS: Therapeutic exercises, Therapeutic activity, Patient/Family education, Self Care, Dry Needling, Electrical stimulation, Cryotherapy, Moist heat, Vasopneumatic device, Ultrasound, and Manual therapy  PLAN FOR NEXT SESSION: STW/M to left post cuff and Tricep region, please check shoulder goals.  STW/M to patient's right low back and SIJ region.  Intermittent lumbar traction at 75#.   Kourtland Coopman,CHRIS, PTA 08/30/2022, 5:58 PM

## 2022-09-06 ENCOUNTER — Encounter: Payer: Self-pay | Admitting: Physical Therapy

## 2022-09-06 ENCOUNTER — Ambulatory Visit: Payer: Medicare Other | Attending: Family Medicine | Admitting: Physical Therapy

## 2022-09-06 DIAGNOSIS — G8929 Other chronic pain: Secondary | ICD-10-CM | POA: Insufficient documentation

## 2022-09-06 DIAGNOSIS — M62830 Muscle spasm of back: Secondary | ICD-10-CM

## 2022-09-06 DIAGNOSIS — M25612 Stiffness of left shoulder, not elsewhere classified: Secondary | ICD-10-CM | POA: Diagnosis not present

## 2022-09-06 DIAGNOSIS — M5441 Lumbago with sciatica, right side: Secondary | ICD-10-CM | POA: Diagnosis not present

## 2022-09-06 DIAGNOSIS — M25512 Pain in left shoulder: Secondary | ICD-10-CM | POA: Diagnosis not present

## 2022-09-06 NOTE — Therapy (Signed)
OUTPATIENT PHYSICAL THERAPY SHOULDER TREATMENT  Patient Name: Lori Soto MRN: [id number] DOB:04/22/1936, 87 y.o., female Today's Date: 09/06/2022  END OF SESSION:  PT End of Session - 09/06/22 1438     Visit Number 24    Number of Visits 28    Date for PT Re-Evaluation 09/06/22    Authorization Type FOTO AT LEAST EVERY 5TH VISIT.  PROGRESS NOTE AT 10TH VISIT.  KX MODIFIER AFTER 15 VISITS.    PT Start Time 1434    PT Stop Time 1520    PT Time Calculation (min) 46 min    Activity Tolerance Patient tolerated treatment well    Behavior During Therapy WFL for tasks assessed/performed            Past Medical History:  Diagnosis Date   Anemia    Arthritis    Cancer (HCC)    skin cancer on nose   Carpal tunnel syndrome, bilateral    Cataract    Esophagitis    GERD (gastroesophageal reflux disease)    H/O hiatal hernia    Osteopenia    Scoliosis    Shingles    SVT (supraventricular tachycardia)    Ulcerative colitis    Past Surgical History:  Procedure Laterality Date   ABDOMINAL HYSTERECTOMY     BUNIONECTOMY     Bilateral   CARPAL TUNNEL RELEASE Left 03/10/2015   Procedure: LEFT CARPAL TUNNEL RELEASE;  Surgeon: Cindee Salt, MD;  Location: [facility];  Service: Orthopedics;  Laterality: Left;   CARPAL TUNNEL RELEASE Right 10/31/2017   Procedure: RIGHT CARPAL TUNNEL RELEASE;  Surgeon: Cindee Salt, MD;  Location: [facility];  Service: Orthopedics;  Laterality: Right;   CATARACT EXTRACTION W/PHACO  05/28/2012   Procedure: CATARACT EXTRACTION PHACO AND INTRAOCULAR LENS PLACEMENT (IOC);  Surgeon: Gemma Payor, MD;  Location: [facility];  Service: Ophthalmology;  Laterality: Right;  CDE=12.84   CATARACT EXTRACTION W/PHACO  06/07/2012   Procedure: CATARACT EXTRACTION PHACO AND INTRAOCULAR LENS PLACEMENT (IOC);  Surgeon: Gemma Payor, MD;  Location: [facility];  Service: Ophthalmology;  Laterality: Left;  CDE 17.60   COLONOSCOPY     COLONOSCOPY N/A 07/08/2015    Procedure: COLONOSCOPY;  Surgeon: Malissa Hippo, MD;  [facility]: [facility];  Service: Endoscopy;  Laterality: N/A;  930   ESOPHAGEAL DILATION N/A 07/08/2015   Procedure: ESOPHAGEAL DILATION;  Surgeon: Malissa Hippo, MD;  [facility];  Service: Endoscopy;  Laterality: N/A;   ESOPHAGOGASTRODUODENOSCOPY N/A 07/08/2015   Procedure: ESOPHAGOGASTRODUODENOSCOPY (EGD);  Surgeon: Malissa Hippo, MD;  [facility]: [facility];  Service: Endoscopy;  Laterality: N/A;   Hernia     Right inguinal   HERNIA REPAIR  1961   right inguinal hernia   UPPER GASTROINTESTINAL ENDOSCOPY     Patient Active Problem List   Diagnosis Date Noted   History of supraventricular tachycardia 11/08/2021   Cough 05/29/2020   Chest congestion 05/29/2020   Arthritis 06/13/2019   GERD (gastroesophageal reflux disease) 06/10/2019   Esophageal stricture 06/10/2019   Educated about COVID-19 virus infection 06/04/2019   SVT (supraventricular tachycardia) 08/28/2018   Elevated troponin 08/28/2018   Light headedness 05/09/2018   Carpal tunnel syndrome of right wrist 05/20/2015   Primary localized osteoarthrosis, hand 05/20/2015   Carpal tunnel syndrome on left 03/18/2015   Osteopenia of the elderly 02/19/2014   DDD (degenerative disc disease), lumbar 12/03/2013   Scoliosis 12/03/2013   Chronic LBP 05/14/2013   IBS (irritable bowel syndrome) 11/08/2012  UC (ulcerative colitis) (HCC) 04/23/2012   Palpitation 10/26/2011   Hypertension 10/26/2011   Chronic ulcerative colitis (HCC) 03/23/2011   REFERRING PROVIDER: Delynn Flavin DO  REFERRING DIAG: Chronic left shoulder pain.  THERAPY DIAG:  Muscle spasm of back  Acute pain of left shoulder  Rationale for Evaluation and Treatment: Rehabilitation  ONSET DATE: Several months.  SUBJECTIVE:                                                                                                                                                                                       SUBJECTIVE STATEMENT: Reports that DN has helped.  PERTINENT HISTORY: Chronic back pain.  PAIN:  Are you having pain?   PRECAUTIONS: None  PATIENT GOALS:Use left shoulder without pain.  NEXT MD VISIT:   TODAY's TX:  09/06/22                                    EXERCISE LOG  Exercise Repetitions and Resistance Comments  UBE 120 RPM x10 min                    Blank cell = exercise not performed today   Modalities  Date: 09/06/22 Traction: lumbar, 75# max/5# min, 15 mins mins Unattended Estim: Shoulder, Pre-Mod, 10 mins, Tone Hot Pack: Shoulder, 10 mins, Tone  ASSESSMENT:  CLINICAL IMPRESSION:    Patient presented in clinic with reports of improvement of shoulder related pain but likes current POC. No complaints with any aspect of treatment. Mechanical lumbar traction maintained at 75# max and normal modalities response noted following removal of the modalities.   OBJECTIVE IMPAIRMENTS: decreased activity tolerance, decreased ROM, decreased strength, increased muscle spasms, and pain.   ACTIVITY LIMITATIONS: carrying, lifting, dressing, and reach over head  PARTICIPATION LIMITATIONS: meal prep, cleaning, laundry, and driving  REHAB POTENTIAL: Good  CLINICAL DECISION MAKING: Stable/uncomplicated  EVALUATION COMPLEXITY: Low  GOALS:  SHORT TERM GOALS: Target date: 06/08/22  Ind with an HEP. Goal status: MET  LONG TERM GOALS: Target date: 08/24/22  Ind with advanced HEP. Goal status: IN PROGRESS  2.  Active left shoulder flexion to 145 degrees so the patient can easily reach overhead. Goal status: Partially met  130 degrees  3.  Active ER to 70 degrees+ to allow for easily donning/doffing of apparel. Goal status: Partially met  4.  Increase left shoulder strength to a solid 4+/5 to increase stability for performance of functional activities. Goal status: IN PROGRESS  5.  Perform ADL's with pain not > 3/10. Goal status: MET (08/12/22).  6.  Perform ADL's with right-sided low back pain not > 4-5.  PLAN:  PT FREQUENCY: 2x/week  PT DURATION: 6 weeks  PLANNED INTERVENTIONS: Therapeutic exercises, Therapeutic activity, Patient/Family education, Self Care, Dry Needling, Electrical stimulation, Cryotherapy, Moist heat, Vasopneumatic device, Ultrasound, and Manual therapy  PLAN FOR NEXT SESSION: STW/M to left post cuff and Tricep region, please check shoulder goals.  STW/M to patient's right low back and SIJ region.  Intermittent lumbar traction at 75#.   Marvell Fuller, PTA 09/06/2022, 4:56 PM

## 2022-09-13 ENCOUNTER — Ambulatory Visit: Payer: Medicare Other | Admitting: *Deleted

## 2022-09-13 DIAGNOSIS — M5441 Lumbago with sciatica, right side: Secondary | ICD-10-CM | POA: Diagnosis not present

## 2022-09-13 DIAGNOSIS — M62830 Muscle spasm of back: Secondary | ICD-10-CM | POA: Diagnosis not present

## 2022-09-13 DIAGNOSIS — M25512 Pain in left shoulder: Secondary | ICD-10-CM | POA: Diagnosis not present

## 2022-09-13 DIAGNOSIS — G8929 Other chronic pain: Secondary | ICD-10-CM | POA: Diagnosis not present

## 2022-09-13 DIAGNOSIS — M25612 Stiffness of left shoulder, not elsewhere classified: Secondary | ICD-10-CM | POA: Diagnosis not present

## 2022-09-13 NOTE — Therapy (Addendum)
OUTPATIENT PHYSICAL THERAPY SHOULDER TREATMENT  Patient Name: Lori Soto MRN: [id number] DOB:Mar 27, 1936, 87 y.o., female Today's Date: 09/13/2022  END OF SESSION:  PT End of Session - 09/13/22 1307     Visit Number 25    Number of Visits 28    Date for PT Re-Evaluation 09/06/22    Authorization Type FOTO AT LEAST EVERY 5TH VISIT.  PROGRESS NOTE AT 10TH VISIT.  KX MODIFIER AFTER 15 VISITS.    PT Start Time 1307    PT Stop Time 1356    PT Time Calculation (min) 49 min            Past Medical History:  Diagnosis Date   Anemia    Arthritis    Cancer (HCC)    skin cancer on nose   Carpal tunnel syndrome, bilateral    Cataract    Esophagitis    GERD (gastroesophageal reflux disease)    H/O hiatal hernia    Osteopenia    Scoliosis    Shingles    SVT (supraventricular tachycardia)    Ulcerative colitis    Past Surgical History:  Procedure Laterality Date   ABDOMINAL HYSTERECTOMY     BUNIONECTOMY     Bilateral   CARPAL TUNNEL RELEASE Left 03/10/2015   Procedure: LEFT CARPAL TUNNEL RELEASE;  Surgeon: Cindee Salt, MD;  Location: [facility];  Service: Orthopedics;  Laterality: Left;   CARPAL TUNNEL RELEASE Right 10/31/2017   Procedure: RIGHT CARPAL TUNNEL RELEASE;  Surgeon: Cindee Salt, MD;  Location: [facility];  Service: Orthopedics;  Laterality: Right;   CATARACT EXTRACTION W/PHACO  05/28/2012   Procedure: CATARACT EXTRACTION PHACO AND INTRAOCULAR LENS PLACEMENT (IOC);  Surgeon: Gemma Payor, MD;  Location: [facility];  Service: Ophthalmology;  Laterality: Right;  CDE=12.84   CATARACT EXTRACTION W/PHACO  06/07/2012   Procedure: CATARACT EXTRACTION PHACO AND INTRAOCULAR LENS PLACEMENT (IOC);  Surgeon: Gemma Payor, MD;  Location: [facility];  Service: Ophthalmology;  Laterality: Left;  CDE 17.60   COLONOSCOPY     COLONOSCOPY N/A 07/08/2015   Procedure: COLONOSCOPY;  Surgeon: Malissa Hippo, MD;  [facility];  Service: Endoscopy;  Laterality:  N/A;  930   ESOPHAGEAL DILATION N/A 07/08/2015   Procedure: ESOPHAGEAL DILATION;  Surgeon: Malissa Hippo, MD;  [facility];  Service: Endoscopy;  Laterality: N/A;   ESOPHAGOGASTRODUODENOSCOPY N/A 07/08/2015   Procedure: ESOPHAGOGASTRODUODENOSCOPY (EGD);  Surgeon: Malissa Hippo, MD;  [facility];  Service: Endoscopy;  Laterality: N/A;   Hernia     Right inguinal   HERNIA REPAIR  1961   right inguinal hernia   UPPER GASTROINTESTINAL ENDOSCOPY     Patient Active Problem List   Diagnosis Date Noted   History of supraventricular tachycardia 11/08/2021   Cough 05/29/2020   Chest congestion 05/29/2020   Arthritis 06/13/2019   GERD (gastroesophageal reflux disease) 06/10/2019   Esophageal stricture 06/10/2019   Educated about COVID-19 virus infection 06/04/2019   SVT (supraventricular tachycardia) 08/28/2018   Elevated troponin 08/28/2018   Light headedness 05/09/2018   Carpal tunnel syndrome of right wrist 05/20/2015   Primary localized osteoarthrosis, hand 05/20/2015   Carpal tunnel syndrome on left 03/18/2015   Osteopenia of the elderly 02/19/2014   DDD (degenerative disc disease), lumbar 12/03/2013   Scoliosis 12/03/2013   Chronic LBP 05/14/2013   IBS (irritable bowel syndrome) 11/08/2012   UC (ulcerative colitis) (HCC) 04/23/2012   Palpitation 10/26/2011   Hypertension 10/26/2011   Chronic ulcerative  colitis (HCC) 03/23/2011   REFERRING PROVIDER: Delynn Flavin DO  REFERRING DIAG: Chronic left shoulder pain.  THERAPY DIAG:  Muscle spasm of back  Acute pain of left shoulder  Rationale for Evaluation and Treatment: Rehabilitation  ONSET DATE: Several months.  SUBJECTIVE:                                                                                                                                                                                      SUBJECTIVE STATEMENT: Reports that DN has helped.  PERTINENT HISTORY: Chronic back  pain.  PAIN:  Are you having pain?   PRECAUTIONS: None  PATIENT GOALS:Use left shoulder without pain.  NEXT MD VISIT:   TODAY's TX:  09/13/22                                    EXERCISE LOG  Exercise Repetitions and Resistance Comments  UBE 120 RPM x5 min   Pulleys X 5 minsg                Blank cell = exercise not performed today   Modalities  Date: 09/13/22 Traction: lumbar, 75# max/5# min, 15 mins mins Unattended Estim: Shoulder, Pre-Mod, 15 mins, Tone Hot Pack: Shoulder,   mins, Tone  Muscles treated: Right iliocostalis Lumborum **Patient tolerated without complaint.   Patient tolerated treatment without complaint with normal modality response following removal of modality.  [place] Applegate MPT   ASSESSMENT:  CLINICAL IMPRESSION:    Patient presented in clinic with reports of improvement of shoulder related pain and able to use LT shldr more with ADL's.  Mechanical lumbar traction maintained at 75# max again  and tolerated well. Decreased pain end of session. Normal modalities response noted following removal of the modalities.   OBJECTIVE IMPAIRMENTS: decreased activity tolerance, decreased ROM, decreased strength, increased muscle spasms, and pain.   ACTIVITY LIMITATIONS: carrying, lifting, dressing, and reach over head  PARTICIPATION LIMITATIONS: meal prep, cleaning, laundry, and driving  REHAB POTENTIAL: Good  CLINICAL DECISION MAKING: Stable/uncomplicated  EVALUATION COMPLEXITY: Low  GOALS:  SHORT TERM GOALS: Target date: 06/08/22  Ind with an HEP. Goal status: MET  LONG TERM GOALS: Target date: 08/24/22  Ind with advanced HEP. Goal status: IN PROGRESS  2.  Active left shoulder flexion to 145 degrees so the patient can easily reach overhead. Goal status: Partially met  130 degrees  3.  Active ER to 70 degrees+ to allow for easily donning/doffing of apparel. Goal status: Partially met  4.  Increase left shoulder strength to a solid 4+/5 to  increase stability for  performance of functional activities. Goal status: IN PROGRESS  5.  Perform ADL's with pain not > 3/10. Goal status: MET (08/12/22).  6.  Perform ADL's with right-sided low back pain not > 4-5.  PLAN:  PT FREQUENCY: 2x/week  PT DURATION: 6 weeks  PLANNED INTERVENTIONS: Therapeutic exercises, Therapeutic activity, Patient/Family education, Self Care, Dry Needling, Electrical stimulation, Cryotherapy, Moist heat, Vasopneumatic device, Ultrasound, and Manual therapy  PLAN FOR NEXT SESSION: STW/M to left post cuff and Tricep region, please check shoulder goals.  STW/M to patient's right low back and SIJ region.  Intermittent lumbar traction at 75#.   Daivon Rayos,CHRIS, PTA 09/13/2022, 1:57 PM

## 2022-09-20 ENCOUNTER — Ambulatory Visit: Payer: Medicare Other | Admitting: *Deleted

## 2022-09-22 ENCOUNTER — Ambulatory Visit: Payer: Medicare Other | Admitting: Physical Therapy

## 2022-09-22 DIAGNOSIS — M5441 Lumbago with sciatica, right side: Secondary | ICD-10-CM | POA: Diagnosis not present

## 2022-09-22 DIAGNOSIS — M62830 Muscle spasm of back: Secondary | ICD-10-CM | POA: Diagnosis not present

## 2022-09-22 DIAGNOSIS — M25612 Stiffness of left shoulder, not elsewhere classified: Secondary | ICD-10-CM | POA: Diagnosis not present

## 2022-09-22 DIAGNOSIS — M25512 Pain in left shoulder: Secondary | ICD-10-CM | POA: Diagnosis not present

## 2022-09-22 DIAGNOSIS — G8929 Other chronic pain: Secondary | ICD-10-CM | POA: Diagnosis not present

## 2022-09-22 NOTE — Therapy (Signed)
OUTPATIENT PHYSICAL THERAPY SHOULDER TREATMENT  Patient Name: Lori Soto MRN: [id number] DOB:1936-02-08, 87 y.o., female Today's Date: 09/22/2022  END OF SESSION:  PT End of Session - 09/22/22 1520     Visit Number 26    Number of Visits 28    Date for PT Re-Evaluation 10/04/22    Authorization Type FOTO AT LEAST EVERY 5TH VISIT.  PROGRESS NOTE AT 10TH VISIT.  KX MODIFIER AFTER 15 VISITS.    PT Start Time 0152    PT Stop Time 0245    PT Time Calculation (min) 53 min    Activity Tolerance Patient tolerated treatment well    Behavior During Therapy WFL for tasks assessed/performed            Past Medical History:  Diagnosis Date   Anemia    Arthritis    Cancer (HCC)    skin cancer on nose   Carpal tunnel syndrome, bilateral    Cataract    Esophagitis    GERD (gastroesophageal reflux disease)    H/O hiatal hernia    Osteopenia    Scoliosis    Shingles    SVT (supraventricular tachycardia)    Ulcerative colitis    Past Surgical History:  Procedure Laterality Date   ABDOMINAL HYSTERECTOMY     BUNIONECTOMY     Bilateral   CARPAL TUNNEL RELEASE Left 03/10/2015   Procedure: LEFT CARPAL TUNNEL RELEASE;  Surgeon: Cindee Salt, MD;  Location: [facility];  Service: Orthopedics;  Laterality: Left;   CARPAL TUNNEL RELEASE Right 10/31/2017   Procedure: RIGHT CARPAL TUNNEL RELEASE;  Surgeon: Cindee Salt, MD;  Location: [facility];  Service: Orthopedics;  Laterality: Right;   CATARACT EXTRACTION W/PHACO  05/28/2012   Procedure: CATARACT EXTRACTION PHACO AND INTRAOCULAR LENS PLACEMENT (IOC);  Surgeon: Gemma Payor, MD;  Location: [facility];  Service: Ophthalmology;  Laterality: Right;  CDE=12.84   CATARACT EXTRACTION W/PHACO  06/07/2012   Procedure: CATARACT EXTRACTION PHACO AND INTRAOCULAR LENS PLACEMENT (IOC);  Surgeon: Gemma Payor, MD;  Location: [facility];  Service: Ophthalmology;  Laterality: Left;  CDE 17.60   COLONOSCOPY     COLONOSCOPY N/A 07/08/2015    Procedure: COLONOSCOPY;  Surgeon: Malissa Hippo, MD;  [facility];  Service: Endoscopy;  Laterality: N/A;  930   ESOPHAGEAL DILATION N/A 07/08/2015   Procedure: ESOPHAGEAL DILATION;  Surgeon: Malissa Hippo, MD;  Location: [facility];  Service: Endoscopy;  Laterality: N/A;   ESOPHAGOGASTRODUODENOSCOPY N/A 07/08/2015   Procedure: ESOPHAGOGASTRODUODENOSCOPY (EGD);  Surgeon: Malissa Hippo, MD;  [facility]: [facility];  Service: Endoscopy;  Laterality: N/A;   Hernia     Right inguinal   HERNIA REPAIR  1961   right inguinal hernia   UPPER GASTROINTESTINAL ENDOSCOPY     Patient Active Problem List   Diagnosis Date Noted   History of supraventricular tachycardia 11/08/2021   Cough 05/29/2020   Chest congestion 05/29/2020   Arthritis 06/13/2019   GERD (gastroesophageal reflux disease) 06/10/2019   Esophageal stricture 06/10/2019   Educated about COVID-19 virus infection 06/04/2019   SVT (supraventricular tachycardia) 08/28/2018   Elevated troponin 08/28/2018   Light headedness 05/09/2018   Carpal tunnel syndrome of right wrist 05/20/2015   Primary localized osteoarthrosis, hand 05/20/2015   Carpal tunnel syndrome on left 03/18/2015   Osteopenia of the elderly 02/19/2014   DDD (degenerative disc disease), lumbar 12/03/2013   Scoliosis 12/03/2013   Chronic LBP 05/14/2013   IBS (irritable bowel syndrome) 11/08/2012  UC (ulcerative colitis) (HCC) 04/23/2012   Palpitation 10/26/2011   Hypertension 10/26/2011   Chronic ulcerative colitis (HCC) 03/23/2011   REFERRING PROVIDER: Delynn Flavin DO  REFERRING DIAG: Chronic left shoulder pain.  THERAPY DIAG:  Muscle spasm of back  Acute pain of left shoulder  Rationale for Evaluation and Treatment: Rehabilitation  ONSET DATE: Several months.  SUBJECTIVE:                                                                                                                                                                                       SUBJECTIVE STATEMENT: DN helped a lot.  Want both sides of back done today.  PERTINENT HISTORY: Chronic back pain.  PAIN:  Are you having pain?   PRECAUTIONS: None  PATIENT GOALS:Use left shoulder without pain.  NEXT MD VISIT:   TODAY's TX:  09/22/22                                    EXERCISE LOG  Exercise Repetitions and Resistance Comments  UBE 90 RPM x 8 min                      Dry needling:  Muscles treated: Bil iliocostalis Lumborum **Patient tolerated without complaint.   Patient tolerated treatment without complaint with normal modality response following removal of modality.  STW/M x 15 minutes to patient's bilateral lower lumbar and upper gluteal musculature f/b Int traction at 80# (99 sec hold f/b 5 sec rest) x 15 minutes.    ASSESSMENT:  CLINICAL IMPRESSION:    Patient presented in clinic with reports of improvement of shoulder related pain and able to use LT shldr more with ADL's.  Mechanical lumbar traction increased to 80# max again  and tolerated well. Decreased pain end of session. She has enjoyed dry needling with very good response.  Normal modalities response noted following removal of the modalities.   OBJECTIVE IMPAIRMENTS: decreased activity tolerance, decreased ROM, decreased strength, increased muscle spasms, and pain.   ACTIVITY LIMITATIONS: carrying, lifting, dressing, and reach over head  PARTICIPATION LIMITATIONS: meal prep, cleaning, laundry, and driving  REHAB POTENTIAL: Good  CLINICAL DECISION MAKING: Stable/uncomplicated  EVALUATION COMPLEXITY: Low  GOALS:  SHORT TERM GOALS: Target date: 06/08/22  Ind with an HEP. Goal status: MET  LONG TERM GOALS: Target date: 08/24/22  Ind with advanced HEP. Goal status: IN PROGRESS  2.  Active left shoulder flexion to 145 degrees so the patient can easily reach overhead. Goal status: Partially met  130 degrees  3.  Active ER to 70  degrees+ to allow for easily  donning/doffing of apparel. Goal status: Partially met  4.  Increase left shoulder strength to a solid 4+/5 to increase stability for performance of functional activities. Goal status: IN PROGRESS  5.  Perform ADL's with pain not > 3/10. Goal status: MET (08/12/22).  6.  Perform ADL's with right-sided low back pain not > 4-5.  PLAN:  PT FREQUENCY: 2x/week  PT DURATION: 6 weeks  PLANNED INTERVENTIONS: Therapeutic exercises, Therapeutic activity, Patient/Family education, Self Care, Dry Needling, Electrical stimulation, Cryotherapy, Moist heat, Vasopneumatic device, Ultrasound, and Manual therapy  PLAN FOR NEXT SESSION: STW/M to left post cuff and Tricep region, please check shoulder goals.  STW/M to patient's right low back and SIJ region.  Intermittent lumbar traction at 75#.   Briceson Broadwater, [place], PT 09/22/2022, 4:12 PM

## 2022-09-29 ENCOUNTER — Ambulatory Visit: Payer: Medicare Other

## 2022-09-29 DIAGNOSIS — M5441 Lumbago with sciatica, right side: Secondary | ICD-10-CM | POA: Diagnosis not present

## 2022-09-29 DIAGNOSIS — M25512 Pain in left shoulder: Secondary | ICD-10-CM | POA: Diagnosis not present

## 2022-09-29 DIAGNOSIS — M62830 Muscle spasm of back: Secondary | ICD-10-CM

## 2022-09-29 DIAGNOSIS — M25612 Stiffness of left shoulder, not elsewhere classified: Secondary | ICD-10-CM | POA: Diagnosis not present

## 2022-09-29 DIAGNOSIS — G8929 Other chronic pain: Secondary | ICD-10-CM

## 2022-09-29 NOTE — Therapy (Addendum)
OUTPATIENT PHYSICAL THERAPY SHOULDER TREATMENT  Patient Name: Lori Soto MRN: [id number] DOB:Oct 16, 1935, 87 y.o., female Today's Date: 09/29/2022  END OF SESSION:  PT End of Session - 09/29/22 1126     Visit Number 27    Number of Visits 28    Date for PT Re-Evaluation 10/04/22    Authorization Type FOTO AT LEAST EVERY 5TH VISIT.  PROGRESS NOTE AT 10TH VISIT.  KX MODIFIER AFTER 15 VISITS.    PT Start Time 1115    PT Stop Time 1220    PT Time Calculation (min) 65 min    Activity Tolerance Patient tolerated treatment well    Behavior During Therapy WFL for tasks assessed/performed            Past Medical History:  Diagnosis Date   Anemia    Arthritis    Cancer (HCC)    skin cancer on nose   Carpal tunnel syndrome, bilateral    Cataract    Esophagitis    GERD (gastroesophageal reflux disease)    H/O hiatal hernia    Osteopenia    Scoliosis    Shingles    SVT (supraventricular tachycardia)    Ulcerative colitis    Past Surgical History:  Procedure Laterality Date   ABDOMINAL HYSTERECTOMY     BUNIONECTOMY     Bilateral   CARPAL TUNNEL RELEASE Left 03/10/2015   Procedure: LEFT CARPAL TUNNEL RELEASE;  Surgeon: Cindee Salt, MD;  Location: [facility];  Service: Orthopedics;  Laterality: Left;   CARPAL TUNNEL RELEASE Right 10/31/2017   Procedure: RIGHT CARPAL TUNNEL RELEASE;  Surgeon: Cindee Salt, MD;  Location: [facility];  Service: Orthopedics;  Laterality: Right;   CATARACT EXTRACTION W/PHACO  05/28/2012   Procedure: CATARACT EXTRACTION PHACO AND INTRAOCULAR LENS PLACEMENT (IOC);  Surgeon: Gemma Payor, MD;  Location: [facility];  Service: Ophthalmology;  Laterality: Right;  CDE=12.84   CATARACT EXTRACTION W/PHACO  06/07/2012   Procedure: CATARACT EXTRACTION PHACO AND INTRAOCULAR LENS PLACEMENT (IOC);  Surgeon: Gemma Payor, MD;  Location: [facility];  Service: Ophthalmology;  Laterality: Left;  CDE 17.60   COLONOSCOPY     COLONOSCOPY N/A 07/08/2015    Procedure: COLONOSCOPY;  Surgeon: Malissa Hippo, MD;  [facility];  Service: Endoscopy;  Laterality: N/A;  930   ESOPHAGEAL DILATION N/A 07/08/2015   Procedure: ESOPHAGEAL DILATION;  Surgeon: Malissa Hippo, MD;  [facility];  Service: Endoscopy;  Laterality: N/A;   ESOPHAGOGASTRODUODENOSCOPY N/A 07/08/2015   Procedure: ESOPHAGOGASTRODUODENOSCOPY (EGD);  Surgeon: Malissa Hippo, MD;  [facility];  Service: Endoscopy;  Laterality: N/A;   Hernia     Right inguinal   HERNIA REPAIR  1961   right inguinal hernia   UPPER GASTROINTESTINAL ENDOSCOPY     Patient Active Problem List   Diagnosis Date Noted   History of supraventricular tachycardia 11/08/2021   Cough 05/29/2020   Chest congestion 05/29/2020   Arthritis 06/13/2019   GERD (gastroesophageal reflux disease) 06/10/2019   Esophageal stricture 06/10/2019   Educated about COVID-19 virus infection 06/04/2019   SVT (supraventricular tachycardia) 08/28/2018   Elevated troponin 08/28/2018   Light headedness 05/09/2018   Carpal tunnel syndrome of right wrist 05/20/2015   Primary localized osteoarthrosis, hand 05/20/2015   Carpal tunnel syndrome on left 03/18/2015   Osteopenia of the elderly 02/19/2014   DDD (degenerative disc disease), lumbar 12/03/2013   Scoliosis 12/03/2013   Chronic LBP 05/14/2013   IBS (irritable bowel syndrome) 11/08/2012  UC (ulcerative colitis) (HCC) 04/23/2012   Palpitation 10/26/2011   Hypertension 10/26/2011   Chronic ulcerative colitis (HCC) 03/23/2011   REFERRING PROVIDER: Delynn Flavin DO  REFERRING DIAG: Chronic left shoulder pain.  THERAPY DIAG:  Muscle spasm of back  Acute pain of left shoulder  Chronic right-sided low back pain with right-sided sciatica  Stiffness of left shoulder, not elsewhere classified  Rationale for Evaluation and Treatment: Rehabilitation  ONSET DATE: Several months.  SUBJECTIVE:                                                                                                                                                                                       SUBJECTIVE STATEMENT: Pt reports minimal bil knee and bil shoulder pain.   PERTINENT HISTORY: Chronic back pain.  PAIN:  Are you having pain?   PRECAUTIONS: None  PATIENT GOALS:Use left shoulder without pain.  NEXT MD VISIT:   TODAY's TX:  09/22/22                                    EXERCISE LOG  Exercise Repetitions and Resistance Comments  UBE 90 RPM x 10 min        STW/M to patient's bilateral lower lumbar and upper gluteal musculature f/b Int traction at 80# (99 sec hold f/b 5 sec rest) x 15 minutes.  Dry needling:  Muscles treated: Bil iliocostalis Lumborum **Patient tolerated without complaint.   Patient tolerated treatment without complaint with normal modality response following removal of modality.  ASSESSMENT:  CLINICAL IMPRESSION:    Pt arrives for today's treatment session reporting mild bil knee and bil shoulder pain.  Pt able to tolerate increased time on UBE today without issue.  Dry needling performed to pt's low back by supervising PT with good results.  STW/M performed to bil lower lumbar and upper glute musculature.  Normal responses to lumbar traction noted upon removal.  Pt reported decreased pain at completion of today's treatment session.    OBJECTIVE IMPAIRMENTS: decreased activity tolerance, decreased ROM, decreased strength, increased muscle spasms, and pain.   ACTIVITY LIMITATIONS: carrying, lifting, dressing, and reach over head  PARTICIPATION LIMITATIONS: meal prep, cleaning, laundry, and driving  REHAB POTENTIAL: Good  CLINICAL DECISION MAKING: Stable/uncomplicated  EVALUATION COMPLEXITY: Low  GOALS:  SHORT TERM GOALS: Target date: 06/08/22  Ind with an HEP. Goal status: MET  LONG TERM GOALS: Target date: 08/24/22  Ind with advanced HEP. Goal status: IN PROGRESS  2.  Active left shoulder flexion to 145  degrees so the patient can easily reach overhead. Goal status: Partially  met  130 degrees  3.  Active ER to 70 degrees+ to allow for easily donning/doffing of apparel. Goal status: Partially met  4.  Increase left shoulder strength to a solid 4+/5 to increase stability for performance of functional activities. Goal status: IN PROGRESS  5.  Perform ADL's with pain not > 3/10. Goal status: MET (08/12/22).  6.  Perform ADL's with right-sided low back pain not > 4-5.  PLAN:  PT FREQUENCY: 2x/week  PT DURATION: 6 weeks  PLANNED INTERVENTIONS: Therapeutic exercises, Therapeutic activity, Patient/Family education, Self Care, Dry Needling, Electrical stimulation, Cryotherapy, Moist heat, Vasopneumatic device, Ultrasound, and Manual therapy  PLAN FOR NEXT SESSION: STW/M to left post cuff and Tricep region, please check shoulder goals.  STW/M to patient's right low back and SIJ region.  Intermittent lumbar traction at 75#.   Newman Pies, PTA 09/29/2022, 12:59 PM     [place] Applegate MPT

## 2022-10-05 ENCOUNTER — Encounter: Payer: Self-pay | Admitting: Physical Therapy

## 2022-10-05 ENCOUNTER — Ambulatory Visit: Payer: Medicare Other | Attending: Family Medicine | Admitting: Physical Therapy

## 2022-10-05 DIAGNOSIS — M62830 Muscle spasm of back: Secondary | ICD-10-CM | POA: Diagnosis not present

## 2022-10-05 DIAGNOSIS — M25512 Pain in left shoulder: Secondary | ICD-10-CM | POA: Diagnosis not present

## 2022-10-05 NOTE — Therapy (Addendum)
OUTPATIENT PHYSICAL THERAPY SHOULDER TREATMENT  Patient Name: Lori Soto MRN: [id number] DOB:30-Mar-1936, 87 y.o., female Today's Date: 10/05/2022  END OF SESSION:  PT End of Session - 10/05/22 1311     Visit Number 28    Number of Visits 28    Date for PT Re-Evaluation 10/04/22    Authorization Type FOTO AT LEAST EVERY 5TH VISIT.  PROGRESS NOTE AT 10TH VISIT.  KX MODIFIER AFTER 15 VISITS.    PT Start Time 1307    PT Stop Time 1403    PT Time Calculation (min) 56 min    Activity Tolerance Patient tolerated treatment well    Behavior During Therapy WFL for tasks assessed/performed            Past Medical History:  Diagnosis Date   Anemia    Arthritis    Cancer (HCC)    skin cancer on nose   Carpal tunnel syndrome, bilateral    Cataract    Esophagitis    GERD (gastroesophageal reflux disease)    H/O hiatal hernia    Osteopenia    Scoliosis    Shingles    SVT (supraventricular tachycardia)    Ulcerative colitis    Past Surgical History:  Procedure Laterality Date   ABDOMINAL HYSTERECTOMY     BUNIONECTOMY     Bilateral   CARPAL TUNNEL RELEASE Left 03/10/2015   Procedure: LEFT CARPAL TUNNEL RELEASE;  Surgeon: Cindee Salt, MD;  Location: [facility];  Service: Orthopedics;  Laterality: Left;   CARPAL TUNNEL RELEASE Right 10/31/2017   Procedure: RIGHT CARPAL TUNNEL RELEASE;  Surgeon: Cindee Salt, MD;  Location: [facility];  Service: Orthopedics;  Laterality: Right;   CATARACT EXTRACTION W/PHACO  05/28/2012   Procedure: CATARACT EXTRACTION PHACO AND INTRAOCULAR LENS PLACEMENT (IOC);  Surgeon: Gemma Payor, MD;  Location: [facility];  Service: Ophthalmology;  Laterality: Right;  CDE=12.84   CATARACT EXTRACTION W/PHACO  06/07/2012   Procedure: CATARACT EXTRACTION PHACO AND INTRAOCULAR LENS PLACEMENT (IOC);  Surgeon: Gemma Payor, MD;  Location: [facility];  Service: Ophthalmology;  Laterality: Left;  CDE 17.60   COLONOSCOPY     COLONOSCOPY N/A 07/08/2015    Procedure: COLONOSCOPY;  Surgeon: Malissa Hippo, MD;  [facility];  Service: Endoscopy;  Laterality: N/A;  930   ESOPHAGEAL DILATION N/A 07/08/2015   Procedure: ESOPHAGEAL DILATION;  Surgeon: Malissa Hippo, MD;  [facility];  Service: Endoscopy;  Laterality: N/A;   ESOPHAGOGASTRODUODENOSCOPY N/A 07/08/2015   Procedure: ESOPHAGOGASTRODUODENOSCOPY (EGD);  Surgeon: Malissa Hippo, MD;  [facility];  Service: Endoscopy;  Laterality: N/A;   Hernia     Right inguinal   HERNIA REPAIR  1961   right inguinal hernia   UPPER GASTROINTESTINAL ENDOSCOPY     Patient Active Problem List   Diagnosis Date Noted   History of supraventricular tachycardia 11/08/2021   Cough 05/29/2020   Chest congestion 05/29/2020   Arthritis 06/13/2019   GERD (gastroesophageal reflux disease) 06/10/2019   Esophageal stricture 06/10/2019   Educated about COVID-19 virus infection 06/04/2019   SVT (supraventricular tachycardia) 08/28/2018   Elevated troponin 08/28/2018   Light headedness 05/09/2018   Carpal tunnel syndrome of right wrist 05/20/2015   Primary localized osteoarthrosis, hand 05/20/2015   Carpal tunnel syndrome on left 03/18/2015   Osteopenia of the elderly 02/19/2014   DDD (degenerative disc disease), lumbar 12/03/2013   Scoliosis 12/03/2013   Chronic LBP 05/14/2013   IBS (irritable bowel syndrome) 11/08/2012  UC (ulcerative colitis) (HCC) 04/23/2012   Palpitation 10/26/2011   Hypertension 10/26/2011   Chronic ulcerative colitis (HCC) 03/23/2011   REFERRING PROVIDER: Delynn Flavin DO  REFERRING DIAG: Chronic left shoulder pain.  THERAPY DIAG:  Muscle spasm of back  Acute pain of left shoulder  Rationale for Evaluation and Treatment: Rehabilitation  ONSET DATE: Several months.  SUBJECTIVE:                                                                                                                                                                                       SUBJECTIVE STATEMENT: Mentally fatigued from doing work for [employer]. Feels good today but having knee pain.   PERTINENT HISTORY: Chronic back pain.  PAIN:  Are you having pain? No score provided  PRECAUTIONS: None  PATIENT GOALS:Use left shoulder without pain.  NEXT MD VISIT:   TODAY's TX:  UPPER EXTREMITY MMT:  MMT Left 10/05/22  Shoulder flexion 4/5 painful  Shoulder extension   Shoulder abduction   Shoulder adduction   Shoulder extension   Shoulder internal rotation 4-/5 painful  Shoulder external rotation 4-/5 painful  Middle trapezius   Lower trapezius   Elbow flexion   Elbow extension   Wrist flexion   Wrist extension   Wrist ulnar deviation   Wrist radial deviation   Wrist pronation   Wrist supination   Grip strength    (Blank rows = not tested)    10/05/22 EXERCISE LOG  Exercise Repetitions and Resistance Comments  UBE 90 RPM x 10 min        Manual Therapy Soft Tissue Mobilization: R lumbar paraspinals/ QL, to reduce tone     Modalities  Date: 10/05/22 Traction: lumbar, 80# max/5# min, 15 mins mins  ASSESSMENT:  CLINICAL IMPRESSION:    Patient presented in clinic with less LBP and shoulder pain compared to knee pain today. Patient provided new HEP and handout for local gym facility to continue exercise. Patient verbalized understanding of HEP instruction. Patient reported greater tenderness with manual therapy to R lumbar musculature. [place] Applegate, MPT conducted DN as well to lumbar musculature. Mechanical lumbar traction continued at 80# max. Limited goal progress with shoulder ROM and MMT tested was conducted with reports of pain at lateral L shoulder in deltoid/RTC attachment region.  OBJECTIVE IMPAIRMENTS: decreased activity tolerance, decreased ROM, decreased strength, increased muscle spasms, and pain.   ACTIVITY LIMITATIONS: carrying, lifting, dressing, and reach over head  PARTICIPATION LIMITATIONS: meal prep, cleaning,  laundry, and driving  REHAB POTENTIAL: Good  CLINICAL DECISION MAKING: Stable/uncomplicated  EVALUATION COMPLEXITY: Low  GOALS:  SHORT TERM GOALS: Target date: 06/08/22  Ind with an HEP.  Goal status: MET  LONG TERM GOALS: Target date: 08/24/22  Ind with advanced HEP. Goal status: Provided at DC  2.  Active left shoulder flexion to 145 degrees so the patient can easily reach overhead. Goal status: Partially met  130 degrees  3.  Active ER to 70 degrees+ to allow for easily donning/doffing of apparel. Goal status: Partially met  4.  Increase left shoulder strength to a solid 4+/5 to increase stability for performance of functional activities. Goal status: NOT MET  5.  Perform ADL's with pain not > 3/10. Goal status: MET (08/12/22).  6.  Perform ADL's with right-sided low back pain not > 4-5.   Goal status: PARTIALLY MET  PLAN:  PT FREQUENCY: 2x/week  PT DURATION: 6 weeks  PLANNED INTERVENTIONS: Therapeutic exercises, Therapeutic activity, Patient/Family education, Self Care, Dry Needling, Electrical stimulation, Cryotherapy, Moist heat, Vasopneumatic device, Ultrasound, and Manual therapy  PLAN FOR NEXT SESSION: DC   Marvell Fuller, PTA 10/05/2022, 2:54 PM   PHYSICAL THERAPY DISCHARGE SUMMARY  Visits from Start of Care: 28.  Current functional level related to goals / functional outcomes: See above.   Remaining deficits: Continued low back pain but less since starting PT.  Please see goal section.   Education / Equipment: HEP.   Patient agrees to discharge. Patient goals were partially met. Patient is being discharged due to being pleased with the current functional level.    [place] Applegate MPT

## 2022-10-27 DIAGNOSIS — H353112 Nonexudative age-related macular degeneration, right eye, intermediate dry stage: Secondary | ICD-10-CM | POA: Diagnosis not present

## 2022-11-02 ENCOUNTER — Telehealth: Payer: Self-pay | Admitting: Family Medicine

## 2022-11-02 ENCOUNTER — Other Ambulatory Visit: Payer: Self-pay

## 2022-11-02 DIAGNOSIS — I10 Essential (primary) hypertension: Secondary | ICD-10-CM

## 2022-11-02 DIAGNOSIS — E782 Mixed hyperlipidemia: Secondary | ICD-10-CM

## 2022-11-02 NOTE — Telephone Encounter (Signed)
Please add lab orders, made appt for lab for 7/12 and patient has appt with PCP on 7/16

## 2022-11-02 NOTE — Telephone Encounter (Signed)
Future lab orders placed

## 2022-11-11 ENCOUNTER — Other Ambulatory Visit: Payer: Medicare Other

## 2022-11-11 DIAGNOSIS — E782 Mixed hyperlipidemia: Secondary | ICD-10-CM

## 2022-11-11 DIAGNOSIS — I10 Essential (primary) hypertension: Secondary | ICD-10-CM

## 2022-11-11 LAB — LIPID PANEL: Triglycerides: 50 mg/dL (ref 0–149)

## 2022-11-11 LAB — CMP14+EGFR
ALT: 14 IU/L (ref 0–32)
AST: 23 IU/L (ref 0–40)
Albumin: 4 g/dL (ref 3.7–4.7)
BUN/Creatinine Ratio: 9 — ABNORMAL LOW (ref 12–28)
BUN: 7 mg/dL — ABNORMAL LOW (ref 8–27)
CO2: 27 mmol/L (ref 20–29)
Calcium: 9.6 mg/dL (ref 8.7–10.3)
Glucose: 88 mg/dL (ref 70–99)
Potassium: 4.4 mmol/L (ref 3.5–5.2)
Total Protein: 6.5 g/dL (ref 6.0–8.5)

## 2022-11-11 LAB — CBC WITH DIFFERENTIAL/PLATELET
Basophils Absolute: 0.1 10*3/uL (ref 0.0–0.2)
EOS (ABSOLUTE): 0.1 10*3/uL (ref 0.0–0.4)
Immature Grans (Abs): 0 10*3/uL (ref 0.0–0.1)
MCH: 29.7 pg (ref 26.6–33.0)
MCV: 87 fL (ref 79–97)
Monocytes Absolute: 0.5 10*3/uL (ref 0.1–0.9)
Monocytes: 10 %
Neutrophils Absolute: 2.7 10*3/uL (ref 1.4–7.0)
Platelets: 260 10*3/uL (ref 150–450)
RBC: 4.45 x10E6/uL (ref 3.77–5.28)
RDW: 12.7 % (ref 11.7–15.4)

## 2022-11-12 LAB — CMP14+EGFR
Alkaline Phosphatase: 49 IU/L (ref 44–121)
Bilirubin Total: 0.5 mg/dL (ref 0.0–1.2)
Chloride: 103 mmol/L (ref 96–106)
Creatinine, Ser: 0.76 mg/dL (ref 0.57–1.00)
Globulin, Total: 2.5 g/dL (ref 1.5–4.5)
Sodium: 143 mmol/L (ref 134–144)
eGFR: 77 mL/min/{1.73_m2} (ref >59)

## 2022-11-12 LAB — LIPID PANEL
Chol/HDL Ratio: 2.9 ratio (ref 0.0–4.4)
Cholesterol, Total: 172 mg/dL (ref 100–199)
VLDL Cholesterol Cal: 10 mg/dL (ref 5–40)

## 2022-11-12 LAB — CBC WITH DIFFERENTIAL/PLATELET
Basos: 1 %
Eos: 2 %
Hematocrit: 38.7 % (ref 34.0–46.6)
Hemoglobin: 13.2 g/dL (ref 11.1–15.9)
Immature Granulocytes: 0 %
Lymphocytes Absolute: 1.4 10*3/uL (ref 0.7–3.1)
Lymphs: 29 %
MCHC: 34.1 g/dL (ref 31.5–35.7)
Neutrophils: 58 %
WBC: 4.7 10*3/uL (ref 3.4–10.8)

## 2022-11-15 ENCOUNTER — Ambulatory Visit (INDEPENDENT_AMBULATORY_CARE_PROVIDER_SITE_OTHER): Payer: Medicare Other

## 2022-11-15 ENCOUNTER — Encounter: Payer: Self-pay | Admitting: Family Medicine

## 2022-11-15 ENCOUNTER — Ambulatory Visit (INDEPENDENT_AMBULATORY_CARE_PROVIDER_SITE_OTHER): Payer: Medicare Other | Admitting: Family Medicine

## 2022-11-15 VITALS — BP 128/70 | HR 114 | Temp 98.4°F | Ht 60.0 in | Wt 121.0 lb

## 2022-11-15 DIAGNOSIS — I10 Essential (primary) hypertension: Secondary | ICD-10-CM | POA: Diagnosis not present

## 2022-11-15 DIAGNOSIS — M4125 Other idiopathic scoliosis, thoracolumbar region: Secondary | ICD-10-CM | POA: Diagnosis not present

## 2022-11-15 DIAGNOSIS — M2170 Unequal limb length (acquired), unspecified site: Secondary | ICD-10-CM

## 2022-11-15 DIAGNOSIS — M47816 Spondylosis without myelopathy or radiculopathy, lumbar region: Secondary | ICD-10-CM | POA: Diagnosis not present

## 2022-11-15 DIAGNOSIS — M4187 Other forms of scoliosis, lumbosacral region: Secondary | ICD-10-CM | POA: Diagnosis not present

## 2022-11-15 DIAGNOSIS — M85852 Other specified disorders of bone density and structure, left thigh: Secondary | ICD-10-CM

## 2022-11-15 NOTE — Patient Instructions (Signed)
Referral to spine and scoliosis center in [place] placed. We'll try and update the spinal xrays to send them today

## 2022-11-15 NOTE — Progress Notes (Signed)
Subjective: CC: Spinal scoliosis, back pain PCP: Raliegh Ip, DO [id number] Lori Soto is a 87 y.o. female presenting to clinic today for:  1.  Hypertension Patient is compliant with Lopressor 25 mg twice daily.  Blood pressures are well-controlled typically 120/70 at home.  No reports of chest pain, shortness of breath or edema.    2. Back pain secondary to spinal scoliosis Patient with longstanding history of thoracolumbar scoliotic curve.  As of late she feels like it is progressing.  She finds it harder to balance because of hip asymmetry.  She often has to "level herself out".  Physical therapy has been extremely helpful and dry needling has been helpful as well.  She has been maintaining pain and function with once weekly physical therapy sessions but it sounds like her insurance is not wanting to accommodate these going forward because they "only pay for a certain amount".  She has never seen a spine and scoliosis doctor but she would most certainly be willing to.  She has done some self over-the-counter bracing but finds that it digs into her abdomen and makes her hiatal hernia worse.  She does not want to pursue any type of pain medications   ROS: Per HPI  Allergies  Allergen Reactions   Penicillins Itching and Swelling    Patient states that she had a rash also Has patient had a PCN reaction causing immediate rash, facial/tongue/throat swelling, SOB or lightheadedness with hypotension: No Has patient had a PCN reaction causing severe rash involving mucus membranes or skin necrosis: No Has patient had a PCN reaction that required hospitalization No Has patient had a PCN reaction occurring within the last 10 years: No If all of the above answers are "NO", then may proceed with Cephalosporin use.    Influenza Virus Vacc Split Pf Rash    Per Patient she was told by her PCP not to ever take this injection again   Tape Itching   Past Medical History:  Diagnosis Date    Anemia    Arthritis    Cancer (HCC)    skin cancer on nose   Carpal tunnel syndrome, bilateral    Cataract    Esophagitis    GERD (gastroesophageal reflux disease)    H/O hiatal hernia    Osteopenia    Scoliosis    Shingles    SVT (supraventricular tachycardia)    Ulcerative colitis     Current Outpatient Medications:    acetaminophen (TYLENOL) 650 MG CR tablet, Take 650 mg by mouth every 8 (eight) hours as needed. Patient states that she takes prn for arthritis in her knees., Disp: , Rfl:    Bromelains 500 MG TABS, Take 500 mg by mouth daily., Disp: , Rfl:    Coenzyme Q10 (CO Q-10 PO), Take 1 tablet by mouth daily. With red yeast rice [employer], Disp: , Rfl:    COLLAGEN PO, Take 1 tablet by mouth daily. This is known as 1-2-3, Disp: , Rfl:    Cyanocobalamin (VITAMIN B 12 PO), Take 1,000 mcg by mouth daily., Disp: , Rfl:    Ginkgo Biloba 40 MG TABS, Take 40 mg by mouth daily., Disp: , Rfl:    glucosamine-chondroitin 500-400 MG tablet, Take 1 tablet by mouth 2 (two) times daily., Disp: , Rfl:    Hyaluronic Acid-Vitamin C (HYALURONIC ACID PO), Take 80 mg by mouth daily., Disp: , Rfl:    Mesalamine (ASACOL) 400 MG CPDR DR capsule, TAKE 2 CAPSULES DAILY, Disp:  180 capsule, Rfl: 3   metoprolol tartrate (LOPRESSOR) 25 MG tablet, TAKE 1 TABLET BY MOUTH TWICE  DAILY, Disp: 180 tablet, Rfl: 3   MULTIPLE VITAMINS PO, Take 1 tablet by mouth daily. In the mornings, Disp: , Rfl:    Nutritional Supplements (GRAPESEED EXTRACT PO), Take 60 mg by mouth daily. , Disp: , Rfl:    OAT BRAN SOLUBLE PO, Take 1 tablet by mouth daily., Disp: , Rfl:    OVER THE COUNTER MEDICATION, Take 66 mg by mouth daily. Vision Gold Lutein, Disp: , Rfl:    OVER THE COUNTER MEDICATION, Take 250 mg by mouth daily. Alphalipoic Acid 250 mg daily, Disp: , Rfl:    pantoprazole (PROTONIX) 20 MG tablet, TAKE 1 TABLET BY MOUTH DAILY  BEFORE BREAKFAST, Disp: 90 tablet, Rfl: 3   pyridOXINE (VITAMIN B-6) 100 MG tablet, Take  100 mg by mouth daily., Disp: , Rfl:    Turmeric 500 MG CAPS, Take 1 capsule by mouth 3 (three) times daily. , Disp: , Rfl:    Vitamins C E (CRANBERRY CONCENTRATE PO), Take 2,500 mg by mouth daily., Disp: , Rfl:    Zinc 50 MG CAPS, Take 50 mg by mouth daily., Disp: , Rfl:  Social History   Socioeconomic History   Marital status: Widowed    Spouse name: Not on file   Number of children: 3   Years of education: Not on file   Highest education level: Bachelor's degree (e.g., BA, AB, BS)  Occupational History    Employer: RETIRED    Comment: accounting  Tobacco Use   Smoking status: Never   Smokeless tobacco: Never   Tobacco comments:    Never smoker  Vaping Use   Vaping status: Never Used  Substance and Sexual Activity   Alcohol use: Yes    Alcohol/week: 0.0 standard drinks of alcohol    Comment: Very Rarely   Drug use: No   Sexual activity: Not Currently    Birth control/protection: Surgical  Other Topics Concern   Not on file  Social History Narrative   Lives alone in 2 story home. Still very independent   Social Determinants of Health   Financial Resource Strain: Low Risk  (06/17/2022)   Overall Financial Resource Strain (CARDIA)    Difficulty of Paying Living Expenses: Not hard at all  Food Insecurity: No Food Insecurity (06/17/2022)   Hunger Vital Sign    Worried About Running Out of Food in the Last Year: Never true    Ran Out of Food in the Last Year: Never true  Transportation Needs: No Transportation Needs (06/17/2022)   PRAPARE - Administrator, Civil Service (Medical): No    Lack of Transportation (Non-Medical): No  Physical Activity: Insufficiently Active (06/17/2022)   Exercise Vital Sign    Days of Exercise per Week: 3 days    Minutes of Exercise per Session: 30 min  Stress: No Stress Concern Present (06/17/2022)   [employer] of Occupational Health - Occupational Stress Questionnaire    Feeling of Stress : Not at all  Social Connections:  Moderately Integrated (06/17/2022)   Social Connection and Isolation Panel [NHANES]    Frequency of Communication with Friends and Family: More than three times a week    Frequency of Social Gatherings with Friends and Family: More than three times a week    Attends Religious Services: More than 4 times per year    Active Member of Clubs or Organizations: Yes    Attends  Club or Organization Meetings: More than 4 times per year    Marital Status: Widowed  Intimate Partner Violence: Not At Risk (06/17/2022)   Humiliation, Afraid, Rape, and Kick questionnaire    Fear of Current or Ex-Partner: No    Emotionally Abused: No    Physically Abused: No    Sexually Abused: No   Family History  Problem Relation Age of Onset   Arthritis Mother    Heart disease Mother    Cancer Father        pancreat   Pancreatic cancer Father    Arthritis Father        back issues    Cancer Brother        lung and liver   Liver cancer Brother    Lung cancer Brother    Heart disease Sister        Rheumatic Fever   Peptic Ulcer Sister    Diabetes Sister    Colon cancer Sister    Edema Sister    GI problems Sister        diverticulitis   Diabetes Sister    Neuropathy Sister    COPD Sister    Hiatal hernia Sister    GI problems Sister     Objective: Office vital signs reviewed. BP 128/70   Pulse (!) 114   Temp 98.4 F (36.9 C)   Ht 5' (1.524 m)   Wt 121 lb (54.9 kg)   SpO2 96%   BMI 23.63 kg/m   Physical Examination:  General: Awake, alert, well nourished, No acute distress HEENT sclera white, MMM Cardio: regular rate and rhythm, S1S2 heard, no murmurs appreciated Pulm: clear to auscultation bilaterally, no wheezes, rhonchi or rales; normal work of breathing on room air MSK: 1/4" leg length discrepancy appreciated.  She is ambulating independently  Spine: Moderate to severe thoracolumbar curvature appreciated to the right with subsequent hump formation on the right.  Hips: She has visible  high riding right hip  Assessment/ Plan: 87 y.o. female   Primary hypertension  Osteopenia of neck of left femur - Plan: DG [facility] DEXA  Other idiopathic scoliosis, thoracolumbar region - Plan: Ambulatory referral to Neurosurgery, DG SCOLIOSIS EVAL COMPLETE SPINE 2 OR 3 VIEWS, Ambulatory referral to Physical Therapy  Leg length discrepancy  Blood pressure is controlled upon recheck.  No changes needed  DEXA scan ordered  Updated x-rays of the back collected.  Personal review very visible moderate to severe thoracolumbar curvature noted.  Referral to scoliosis and spine placed and I have renewed her physical therapy order for ongoing treatment as this has been extremely helpful and she does not wish to pursue any type of pain medications to maintain function.  Additionally, leg length discrepancy noted.  She has about 1/4 inch difference between her lengths.  I suspect they will need to offer heel lift as well  No orders of the defined types were placed in this encounter.  No orders of the defined types were placed in this encounter.    Raliegh Ip, DO [facility] [phone]

## 2022-11-22 ENCOUNTER — Ambulatory Visit (INDEPENDENT_AMBULATORY_CARE_PROVIDER_SITE_OTHER): Payer: Medicare Other

## 2022-11-22 DIAGNOSIS — M8588 Other specified disorders of bone density and structure, other site: Secondary | ICD-10-CM

## 2022-11-22 DIAGNOSIS — M85852 Other specified disorders of bone density and structure, left thigh: Secondary | ICD-10-CM

## 2022-11-23 ENCOUNTER — Emergency Department (HOSPITAL_COMMUNITY)
Admission: EM | Admit: 2022-11-23 | Discharge: 2022-11-23 | Disposition: A | Discharge status: Home / Self Care | Payer: Medicare Other | Attending: Emergency Medicine | Admitting: Emergency Medicine

## 2022-11-23 ENCOUNTER — Other Ambulatory Visit: Payer: Self-pay

## 2022-11-23 ENCOUNTER — Emergency Department (HOSPITAL_COMMUNITY): Payer: Medicare Other

## 2022-11-23 ENCOUNTER — Encounter (HOSPITAL_COMMUNITY): Payer: Self-pay | Admitting: Emergency Medicine

## 2022-11-23 ENCOUNTER — Other Ambulatory Visit: Payer: Self-pay | Admitting: Family Medicine

## 2022-11-23 DIAGNOSIS — I10 Essential (primary) hypertension: Secondary | ICD-10-CM | POA: Insufficient documentation

## 2022-11-23 DIAGNOSIS — I471 Supraventricular tachycardia, unspecified: Secondary | ICD-10-CM | POA: Diagnosis not present

## 2022-11-23 DIAGNOSIS — M4125 Other idiopathic scoliosis, thoracolumbar region: Secondary | ICD-10-CM

## 2022-11-23 DIAGNOSIS — R000 Tachycardia, unspecified: Secondary | ICD-10-CM | POA: Diagnosis not present

## 2022-11-23 DIAGNOSIS — Z79899 Other long term (current) drug therapy: Secondary | ICD-10-CM | POA: Diagnosis not present

## 2022-11-23 DIAGNOSIS — R531 Weakness: Secondary | ICD-10-CM | POA: Diagnosis not present

## 2022-11-23 DIAGNOSIS — R7989 Other specified abnormal findings of blood chemistry: Secondary | ICD-10-CM | POA: Diagnosis not present

## 2022-11-23 DIAGNOSIS — M85852 Other specified disorders of bone density and structure, left thigh: Secondary | ICD-10-CM

## 2022-11-23 DIAGNOSIS — M2170 Unequal limb length (acquired), unspecified site: Secondary | ICD-10-CM

## 2022-11-23 DIAGNOSIS — K449 Diaphragmatic hernia without obstruction or gangrene: Secondary | ICD-10-CM | POA: Diagnosis not present

## 2022-11-23 DIAGNOSIS — R0789 Other chest pain: Secondary | ICD-10-CM | POA: Diagnosis not present

## 2022-11-23 DIAGNOSIS — R0989 Other specified symptoms and signs involving the circulatory and respiratory systems: Secondary | ICD-10-CM | POA: Diagnosis not present

## 2022-11-23 DIAGNOSIS — I959 Hypotension, unspecified: Secondary | ICD-10-CM | POA: Diagnosis not present

## 2022-11-23 LAB — CBC
HCT: 40.7 % (ref 36.0–46.0)
Hemoglobin: 13.3 g/dL (ref 12.0–15.0)
MCH: 29.9 pg (ref 26.0–34.0)
MCHC: 32.7 g/dL (ref 30.0–36.0)
MCV: 91.5 fL (ref 80.0–100.0)
Platelets: 221 10*3/uL (ref 150–400)
RBC: 4.45 MIL/uL (ref 3.87–5.11)
RDW: 13.4 % (ref 11.5–15.5)
WBC: 5 10*3/uL (ref 4.0–10.5)
nRBC: 0 % (ref 0.0–0.2)

## 2022-11-23 LAB — TROPONIN I (HIGH SENSITIVITY)
Troponin I (High Sensitivity): 27 ng/L — ABNORMAL HIGH (ref <18)
Troponin I (High Sensitivity): 83 ng/L — ABNORMAL HIGH (ref <18)

## 2022-11-23 LAB — BASIC METABOLIC PANEL
Anion gap: 6 (ref 5–15)
BUN: 11 mg/dL (ref 8–23)
CO2: 31 mmol/L (ref 22–32)
Calcium: 9.2 mg/dL (ref 8.9–10.3)
Chloride: 103 mmol/L (ref 98–111)
Creatinine, Ser: 0.8 mg/dL (ref 0.44–1.00)
GFR, Estimated: >60 mL/min (ref >60)
Glucose, Bld: 112 mg/dL — ABNORMAL HIGH (ref 70–99)
Potassium: 4.5 mmol/L (ref 3.5–5.1)
Sodium: 140 mmol/L (ref 135–145)

## 2022-11-23 LAB — MAGNESIUM: Magnesium: 1.9 mg/dL (ref 1.7–2.4)

## 2022-11-23 NOTE — ED Triage Notes (Signed)
Pt from home via [facility] with reports of SVT. Upon arrival pts HR 180 and BP 67/43. PT was given 6mg  Adenosine and converted to NSR.

## 2022-11-23 NOTE — Discharge Instructions (Addendum)
Continue your current medications.  Return to the ED for recurrent episodes.  Follow-up with Dr. Antoine Poche in the office to be rechecked.  The office should call you to schedule an appointment

## 2022-11-23 NOTE — ED Provider Notes (Signed)
[place] EMERGENCY DEPARTMENT AT [facility] Provider Note   CSN: [id number] Arrival date & time: 11/23/22  1032     History  Chief Complaint  Patient presents with   Tachycardia    Lori Soto is a 87 y.o. female.  HPI   Patient has a history of SVT, chronic ulcerative colitis, hypertension, IBS, degenerative disc disease, GERD..  She presents to the ED with complaints of palpitations and tachycardia.  Patient states she felt another episode of her SVT earlier this morning.  Patient states she has been told in the past per cardiologist that she can take a dose of her medication to see if the heart rate resolves on its own.  Patient states she did that but the symptoms persisted.  She called EMS and she was given a dose of adenosine.  Patient states her tachycardia resolved and she is feeling fine now.  Patient states she did have some chest discomfort earlier when the heart rate was racing.  She denies any shortness of breath.  No leg swelling.  No fevers or chills  Home Medications Prior to Admission medications   Medication Sig Start Date End Date Taking? Authorizing Provider  acetaminophen (TYLENOL) 650 MG CR tablet Take 650 mg by mouth every 8 (eight) hours as needed. Patient states that she takes prn for arthritis in her knees.    [provider]  Bromelains 500 MG TABS Take 500 mg by mouth daily.    [provider]  Coenzyme Q10 (CO Q-10 PO) Take 1 tablet by mouth daily. With red yeast rice [employer]    [provider]  COLLAGEN PO Take 1 tablet by mouth daily. This is known as 1-2-3    [provider]  Cyanocobalamin (VITAMIN B 12 PO) Take 1,000 mcg by mouth daily.    [provider]  Ginkgo Biloba 40 MG TABS Take 40 mg by mouth daily.    [provider]  glucosamine-chondroitin 500-400 MG tablet Take 1 tablet by mouth 2 (two) times daily.    [provider]  Hyaluronic Acid-Vitamin C  (HYALURONIC ACID PO) Take 80 mg by mouth daily.    [provider]  Mesalamine (ASACOL) 400 MG CPDR DR capsule TAKE 2 CAPSULES DAILY 03/15/22   [name], [name], MD  metoprolol tartrate (LOPRESSOR) 25 MG tablet TAKE 1 TABLET BY MOUTH TWICE  DAILY 08/15/22   [name], MD  MULTIPLE VITAMINS PO Take 1 tablet by mouth daily. In the mornings    [provider]  Nutritional Supplements (GRAPESEED EXTRACT PO) Take 60 mg by mouth daily.     [provider]  OAT BRAN SOLUBLE PO Take 1 tablet by mouth daily.    [provider]  OVER THE COUNTER MEDICATION Take 66 mg by mouth daily. Vision Gold Lutein    [provider]  OVER THE COUNTER MEDICATION Take 250 mg by mouth daily. Alphalipoic Acid 250 mg daily    [provider]  pantoprazole (PROTONIX) 20 MG tablet TAKE 1 TABLET BY MOUTH DAILY  BEFORE BREAKFAST 05/23/22   [name], [name], MD  pyridOXINE (VITAMIN B-6) 100 MG tablet Take 100 mg by mouth daily.    [provider]  Turmeric 500 MG CAPS Take 1 capsule by mouth 3 (three) times daily.     [provider]  Vitamins C E (CRANBERRY CONCENTRATE PO) Take 2,500 mg by mouth daily.    [provider]  Zinc 50  MG CAPS Take 50 mg by mouth daily.    [provider]      Allergies    Penicillins, Influenza virus vacc split pf, and Tape    Review of Systems   Review of Systems  Physical Exam Updated Vital Signs BP 105/61   Pulse (!) 48   Temp 98.4 F (36.9 C)   Resp (!) 23   Ht 1.524 m (5')   Wt 54.9 kg   SpO2 97%   BMI 23.63 kg/m  Physical Exam Vitals and nursing note reviewed.  Constitutional:      General: She is not in acute distress.    Appearance: She is well-developed.  HENT:     Head: Normocephalic and atraumatic.     Right Ear: External ear normal.     Left Ear: External ear normal.  Eyes:     General: No scleral icterus.       Right eye: No discharge.        Left eye:  No discharge.     Conjunctiva/sclera: Conjunctivae normal.  Neck:     Trachea: No tracheal deviation.  Cardiovascular:     Rate and Rhythm: Normal rate and regular rhythm.  Pulmonary:     Effort: Pulmonary effort is normal. No respiratory distress.     Breath sounds: Normal breath sounds. No stridor. No wheezing or rales.  Abdominal:     General: Bowel sounds are normal. There is no distension.     Palpations: Abdomen is soft.     Tenderness: There is no abdominal tenderness. There is no guarding or rebound.  Musculoskeletal:        General: No tenderness or deformity.     Cervical back: Neck supple.  Skin:    General: Skin is warm and dry.     Findings: No rash.  Neurological:     General: No focal deficit present.     Mental Status: She is alert.     Cranial Nerves: No cranial nerve deficit, dysarthria or facial asymmetry.     Sensory: No sensory deficit.     Motor: No abnormal muscle tone or seizure activity.     Coordination: Coordination normal.  Psychiatric:        Mood and Affect: Mood normal.     ED Results / Procedures / Treatments   Labs (all labs ordered are listed, but only abnormal results are displayed) Labs Reviewed  BASIC METABOLIC PANEL - Abnormal; Notable for the following components:      Result Value   Glucose, Bld 112 (*)    All other components within normal limits  TROPONIN I (HIGH SENSITIVITY) - Abnormal; Notable for the following components:   Troponin I (High Sensitivity) 27 (*)    All other components within normal limits  TROPONIN I (HIGH SENSITIVITY) - Abnormal; Notable for the following components:   Troponin I (High Sensitivity) 83 (*)    All other components within normal limits  CBC  MAGNESIUM    EKG EKG Interpretation Date/Time:  Wednesday November 23 2022 10:42:52 EDT Ventricular Rate:  73 PR Interval:  145 QRS Duration:  89 QT Interval:  419 QTC Calculation: 462 R Axis:   87  Text Interpretation: Sinus rhythm Probable left  atrial enlargement Borderline right axis deviation No significant change since last tracing Confirmed by Linwood Dibbles [id number]) on 11/23/2022 3:15:04 PM  Radiology DG Chest Portable 1 View  Result Date: 11/23/2022 CLINICAL DATA:  SVT, chest discomfort EXAM: PORTABLE CHEST 1 VIEW  COMPARISON:  10/05/2021 FINDINGS: Single frontal view of the chest demonstrates stable enlarged cardiac silhouette. Large hiatal hernia again noted unchanged. No airspace disease, effusion, or pneumothorax. No acute bony abnormality. IMPRESSION: 1. Stable enlarged cardiac silhouette. 2. Stable large hiatal hernia. Electronically Signed   By: Sharlet Salina M.D.   On: 11/23/2022 11:51    Procedures Procedures    Medications Ordered in ED Medications - No data to display  ED Course/ Medical Decision Making/ A&P Clinical Course as of 11/23/22 1515  Wed Nov 23, 2022  1242 CBC magnesium metabolic panel normal.  Troponin slightly elevated at 27 [JK]  1438 Troponin I (High Sensitivity)(!) Troponin in increased from 27-83.  Patient remains symptoms free.  No chest pain.  No recurrent tachycardia [JK]  1445 Discussed with Dr Wyline Mood.  Continue current medications.  OK for outpt management, cardiology follow up  [JK]    Clinical Course User Index [JK] Linwood Dibbles, MD                             Medical Decision Making Problems Addressed: SVT (supraventricular tachycardia): acute illness or injury that poses a threat to life or bodily functions  Amount and/or Complexity of Data Reviewed Labs: ordered. Decision-making details documented in ED Course. Radiology: ordered.   Patient presented to the ED for evaluation of an episode of SVT.  Patient has history of same.  EMS gave the patient 6 of adenosine with resolution of her tachycardia.  In the ED the patient remained asymptomatic without any recurrent episodes.  She did have some chest discomfort with her symptoms so cardiac enzymes were ordered.  EKG does not show any  ischemic changes.  Troponins are elevated but likely related to her tachycardia and I doubt acute coronary syndrome at this time.  Case discussed with Dr. Wyline Mood, agrees with outpatient management and close follow-up.  Discussed findings and plan with patient.  She is comfortable going home.  She understands to return if she starts having recurrent tachycardia chest pain or other concerning symptoms.        Final Clinical Impression(s) / ED Diagnoses Final diagnoses:  SVT (supraventricular tachycardia)    Rx / DC Orders ED Discharge Orders          Ordered    Ambulatory referral to Cardiology       Comments: If you have not heard from the Cardiology office within the next 72 hours please call [phone].   11/23/22 1512              Linwood Dibbles, MD 11/23/22 1515

## 2022-11-24 DIAGNOSIS — Z780 Asymptomatic menopausal state: Secondary | ICD-10-CM | POA: Diagnosis not present

## 2022-11-28 DIAGNOSIS — M47816 Spondylosis without myelopathy or radiculopathy, lumbar region: Secondary | ICD-10-CM | POA: Diagnosis not present

## 2022-11-28 DIAGNOSIS — M4186 Other forms of scoliosis, lumbar region: Secondary | ICD-10-CM | POA: Diagnosis not present

## 2022-11-28 DIAGNOSIS — M419 Scoliosis, unspecified: Secondary | ICD-10-CM | POA: Diagnosis not present

## 2022-11-29 ENCOUNTER — Telehealth: Payer: Self-pay

## 2022-11-29 NOTE — Telephone Encounter (Signed)
Transition Care Management Follow-up Telephone Call Date of discharge and from where: 11/23/2022 [facility] How have you been since you were released from the hospital? Patient stated she is feeling much better. Any questions or concerns? No  Items Reviewed: Did the pt receive and understand the discharge instructions provided? Yes  Medications obtained and verified?  No medication prescribed Other? No  Any new allergies since your discharge? No  Dietary orders reviewed? Yes Do you have support at home? Yes   Follow up appointments reviewed:  PCP Hospital f/u appt confirmed? No  Scheduled to see  on  @ . Specialist Hospital f/u appt confirmed? No  Scheduled to see  on  @ . Are transportation arrangements needed? No  If their condition worsens, is the pt aware to call PCP or go to the Emergency Dept.? Yes Was the patient provided with contact information for the PCP's office or ED? Yes Was to pt encouraged to call back with questions or concerns? Yes  Lori Soto [facility]  [facility] Population Health Community Resource Care Guide   [email or web address]  ?? [phone]   [email or web address]: [email or web address]  [email or web address]

## 2022-12-19 ENCOUNTER — Ambulatory Visit (INDEPENDENT_AMBULATORY_CARE_PROVIDER_SITE_OTHER): Payer: Medicare Other | Admitting: Gastroenterology

## 2022-12-19 ENCOUNTER — Encounter (INDEPENDENT_AMBULATORY_CARE_PROVIDER_SITE_OTHER): Payer: Self-pay | Admitting: Gastroenterology

## 2022-12-19 VITALS — BP 117/74 | HR 62 | Temp 98.7°F | Ht 60.0 in | Wt 121.9 lb

## 2022-12-19 DIAGNOSIS — K2100 Gastro-esophageal reflux disease with esophagitis, without bleeding: Secondary | ICD-10-CM | POA: Diagnosis not present

## 2022-12-19 DIAGNOSIS — K5100 Ulcerative (chronic) pancolitis without complications: Secondary | ICD-10-CM | POA: Diagnosis not present

## 2022-12-19 MED ORDER — PANTOPRAZOLE SODIUM 20 MG PO TBEC
20.0000 mg | DELAYED_RELEASE_TABLET | Freq: Every day | ORAL | 3 refills | Status: DC
Start: 2022-12-19 — End: 2023-05-09

## 2022-12-19 NOTE — Progress Notes (Signed)
Katrinka Blazing, M.D. Gastroenterology & Hepatology [facility] Gastroenterology [address] [place], [place] [address]  Primary Care Physician: Raliegh Ip, DO [address] [place] [address]  I will communicate my assessment and recommendations to the referring MD via EMR.  Problems: Ulcerative pancolitis GERD 3.  History of erosive esophagitis   History of Present Illness: Lori Soto is a 87 y.o. female with past medical history of ulcerative pancolitis, SVT, GERD, osteoporosis, who presents for follow up of ulcerative colitis and GERD.  The patient was last seen on 12/27/2021. At that time, the patient was continued on mesalamine 800 mg every day and pantoprazole 20 mg every other day.  Patient reports that she is staying active.  She reports that she is having regular bowel movements once a day. Very occasionally her stools are loose when she eats fruit. She reports that she was taking mesalamine 800 mg qday, but she cut down to 400 mg qday as she was having constipation. No melena or hematochezia. Occasionally has some bloating.  Patient reports that she has noticed some dysphagia to pills recently. She has been taking pantoprazole 20 mg every other day and she is taking an antacid every other day. She does not have heartburn issues with this.  The patient denies having any nausea, vomiting, fever, chills, hematochezia, melena, hematemesis, abdominal pain, jaundice, pruritus or weight loss.  Most recent blood workup from 11/23/2022 showed BMP with sodium 140, potassium 4.5, glucose 112, creatinine 0.80, BUN 11, WBC 5.0, hemoglobin 13.3 and platelets 221.  Last EGD: 2017  - LA Grade A esophagitis. - Medium-sized hiatal hernia. - Benign-appearing esophageal stenosis. Dilated with balloon up to 18 mm. - Normal stomach. - Normal examined duodenum. - Three biopsies were obtained at the gastroesophageal junction.   Last Colonoscopy:  2017 - One 5 mm polyp in the cecum, removed using lift and cut and a cold snare. Resected and retrieved. - One 7 mm polyp in the sigmoid colon, removed with a hot snare. Resected and retrieved. Clip (MR conditional) was placed. - Diverticulosis in the sigmoid colon.   Path 1. Esophagogastric junction, biopsy - ESOPHAGOGASTRIC JUNCTION MUCOSA - NEGATIVE FOR INTESTINAL METAPLASIA - NEGATIVE FOR DYSPLASIA OR MALIGNANCY 2. Colon, polyp(s), cecal - TUBULAR ADENOMA(X1). - HIGH GRADE DYSPLASIA IS NOT IDENTIFIED. 3. Colon, polyp(s), sigmoid - ULCERATED INFLAMMED INTRAMUCOSAL LEIOMYOMA - NEGATIVE FOR DYSPLASIA OR MALIGNANCY   Last flu shot: is intolerant as she is allergic Last pneumonia shot:states she received 2 shot series in the past Last zoster vaccine: did not have it as she states she had shingles in the past. COVID-19 shot: advised to avoid it due to influenza vaccine allergy  Past Medical History: Past Medical History:  Diagnosis Date   Anemia    Arthritis    Cancer (HCC)    skin cancer on nose   Carpal tunnel syndrome, bilateral    Cataract    Esophagitis    GERD (gastroesophageal reflux disease)    H/O hiatal hernia    Osteopenia    Scoliosis    Shingles    SVT (supraventricular tachycardia)    Ulcerative colitis     Past Surgical History: Past Surgical History:  Procedure Laterality Date   ABDOMINAL HYSTERECTOMY     BUNIONECTOMY     Bilateral   CARPAL TUNNEL RELEASE Left 03/10/2015   Procedure: LEFT CARPAL TUNNEL RELEASE;  Surgeon: Cindee Salt, MD;  Location: [facility];  Service: Orthopedics;  Laterality: Left;   CARPAL TUNNEL RELEASE Right 10/31/2017   Procedure: RIGHT CARPAL TUNNEL RELEASE;  Surgeon: Cindee Salt, MD;  Location: [facility];  Service: Orthopedics;  Laterality: Right;   CATARACT EXTRACTION W/PHACO  05/28/2012   Procedure: CATARACT EXTRACTION PHACO AND INTRAOCULAR LENS PLACEMENT (IOC);  Surgeon: Gemma Payor, MD;  Location:  [facility];  Service: Ophthalmology;  Laterality: Right;  CDE=12.84   CATARACT EXTRACTION W/PHACO  06/07/2012   Procedure: CATARACT EXTRACTION PHACO AND INTRAOCULAR LENS PLACEMENT (IOC);  Surgeon: Gemma Payor, MD;  Location: [facility];  Service: Ophthalmology;  Laterality: Left;  CDE 17.60   COLONOSCOPY     COLONOSCOPY N/A 07/08/2015   Procedure: COLONOSCOPY;  Surgeon: Malissa Hippo, MD;  [facility];  Service: Endoscopy;  Laterality: N/A;  930   ESOPHAGEAL DILATION N/A 07/08/2015   Procedure: ESOPHAGEAL DILATION;  Surgeon: Malissa Hippo, MD;  [facility];  Service: Endoscopy;  Laterality: N/A;   ESOPHAGOGASTRODUODENOSCOPY N/A 07/08/2015   Procedure: ESOPHAGOGASTRODUODENOSCOPY (EGD);  Surgeon: Malissa Hippo, MD;  [facility];  Service: Endoscopy;  Laterality: N/A;   Hernia     Right inguinal   HERNIA REPAIR  1961   right inguinal hernia   UPPER GASTROINTESTINAL ENDOSCOPY      Family History: Family History  Problem Relation Age of Onset   Arthritis Mother    Heart disease Mother    Cancer Father        pancreat   Pancreatic cancer Father    Arthritis Father        back issues    Cancer Brother        lung and liver   Liver cancer Brother    Lung cancer Brother    Heart disease Sister        Rheumatic Fever   Peptic Ulcer Sister    Diabetes Sister    Colon cancer Sister    Edema Sister    GI problems Sister        diverticulitis   Diabetes Sister    Neuropathy Sister    COPD Sister    Hiatal hernia Sister    GI problems Sister     Social History: Social History   Tobacco Use  Smoking Status Never  Smokeless Tobacco Never  Tobacco Comments   Never smoker   Social History   Substance and Sexual Activity  Alcohol Use Yes   Alcohol/week: 0.0 standard drinks of alcohol   Comment: Very Rarely   Social History   Substance and Sexual Activity  Drug Use No    Allergies: Allergies  Allergen Reactions   Penicillins Itching and  Swelling    Patient states that she had a rash also Has patient had a PCN reaction causing immediate rash, facial/tongue/throat swelling, SOB or lightheadedness with hypotension: No Has patient had a PCN reaction causing severe rash involving mucus membranes or skin necrosis: No Has patient had a PCN reaction that required hospitalization No Has patient had a PCN reaction occurring within the last 10 years: No If all of the above answers are "NO", then may proceed with Cephalosporin use.    Influenza Virus Vacc Split Pf Rash    Per Patient she was told by her PCP not to ever take this injection again   Tape Itching    Medications: Current Outpatient Medications  Medication Sig Dispense Refill   acetaminophen (TYLENOL) 650 MG CR tablet Take 650 mg by mouth every 8 (eight)  hours as needed. Patient states that she takes prn for arthritis in her knees.     Bromelains 500 MG TABS Take 500 mg by mouth daily.     Coenzyme Q10 (CO Q-10 PO) Take 1 tablet by mouth daily. With red yeast rice [employer]     Cyanocobalamin (VITAMIN B 12 PO) Take 1,000 mcg by mouth daily.     Ginkgo Biloba 40 MG TABS Take 40 mg by mouth daily.     glucosamine-chondroitin 500-400 MG tablet Take 1 tablet by mouth 2 (two) times daily.     Hyaluronic Acid-Vitamin C (HYALURONIC ACID PO) Take 80 mg by mouth daily.     Mesalamine (ASACOL) 400 MG CPDR DR capsule TAKE 2 CAPSULES DAILY 180 capsule 3   metoprolol tartrate (LOPRESSOR) 25 MG tablet TAKE 1 TABLET BY MOUTH TWICE  DAILY 180 tablet 3   MULTIPLE VITAMINS PO Take 1 tablet by mouth daily. In the mornings     Nutritional Supplements (GRAPESEED EXTRACT PO) Take 60 mg by mouth daily.      OVER THE COUNTER MEDICATION Take 66 mg by mouth daily. Vision Gold Lutein     OVER THE COUNTER MEDICATION Take 250 mg by mouth daily. Alphalipoic Acid 250 mg daily     pantoprazole (PROTONIX) 20 MG tablet TAKE 1 TABLET BY MOUTH DAILY  BEFORE BREAKFAST 90 tablet 3   PRESCRIPTION  MEDICATION Otc med for GERD that she alternates with protonix. Unsure of name of med. Takes every other day     pyridOXINE (VITAMIN B-6) 100 MG tablet Take 100 mg by mouth daily.     Turmeric 500 MG CAPS Take 1 capsule by mouth in the morning and at bedtime.     Vitamins C E (CRANBERRY CONCENTRATE PO) Take 2,500 mg by mouth daily.     Zinc 50 MG CAPS Take 50 mg by mouth daily.     No current facility-administered medications for this visit.    Review of Systems: GENERAL: negative for malaise, night sweats HEENT: No changes in hearing or vision, no nose bleeds or other nasal problems. NECK: Negative for lumps, goiter, pain and significant neck swelling RESPIRATORY: Negative for cough, wheezing CARDIOVASCULAR: Negative for chest pain, leg swelling, palpitations, orthopnea GI: SEE HPI MUSCULOSKELETAL: Negative for joint pain or swelling, back pain, and muscle pain. SKIN: Negative for lesions, rash PSYCH: Negative for sleep disturbance, mood disorder and recent psychosocial stressors. HEMATOLOGY Negative for prolonged bleeding, bruising easily, and swollen nodes. ENDOCRINE: Negative for cold or heat intolerance, polyuria, polydipsia and goiter. NEURO: negative for tremor, gait imbalance, syncope and seizures. The remainder of the review of systems is noncontributory.   Physical Exam: BP 117/74 (BP Location: Left Arm, Patient Position: Sitting, Cuff Size: Normal)   Pulse 62   Temp 98.7 F (37.1 C) (Oral)   Ht 5' (1.524 m)   Wt 121 lb 14.4 oz (55.3 kg)   BMI 23.81 kg/m  GENERAL: The patient is AO x3, in no acute distress. HEENT: Head is normocephalic and atraumatic. EOMI are intact. Mouth is well hydrated and without lesions. NECK: Supple. No masses LUNGS: Clear to auscultation. No presence of rhonchi/wheezing/rales. Adequate chest expansion HEART: RRR, normal s1 and s2. ABDOMEN: Soft, nontender, no guarding, no peritoneal signs, and nondistended. BS +. No masses. EXTREMITIES:  Without any cyanosis, clubbing, rash, lesions or edema. NEUROLOGIC: AOx3, no focal motor deficit. SKIN: no jaundice, no rashes  Imaging/Labs: as above  I personally reviewed and interpreted the available labs,  imaging and endoscopic files.  Impression and Plan: Lori Soto is a 87 y.o. female with past medical history of ulcerative pancolitis, SVT, GERD, osteoporosis, who presents for follow up of ulcerative colitis and GERD.    The patient has presented adequate control of her GERD while taking pantoprazole every other day but is now having worsening dysphagia.  Given her history of esophageal stricture and esophagitis, I would favor using pantoprazole on a regular basis instead of every other day.  I explained that over-the-counter antacids do not prevent the production of acid.  This does not allow the prevention of other diseases such as esophagitis and esophageal cancer.  Due to this, she will increase her pantoprazole to 20 mg every day.  The patient has presented adequate control of her ulcerative colitis with very low-dose mesalamine.  Has not presented any recurrence of diarrhea or abdominal pain/blood in stool.  We discussed continuing this low-dose indefinitely but if her symptoms were to recur, she will need to increase the dose back to 800 mg daily.  - Increase pantoprazole 20 mg qday - Stop over the counter acid reflux medication - Continue mesalamine 400 mg qday, go back to 800 mg qday if diarrhea or loose bowel movements  All questions were answered.      Katrinka Blazing, MD Gastroenterology and Hepatology [facility] Gastroenterology

## 2022-12-19 NOTE — Patient Instructions (Signed)
Increase pantoprazole 20 mg qday Stop over the counter acid reflux medication Continue mesalamine 400 mg qday, go back to 800 mg qday if diarrhea or loose bowel movements

## 2022-12-29 ENCOUNTER — Ambulatory Visit (INDEPENDENT_AMBULATORY_CARE_PROVIDER_SITE_OTHER): Payer: Medicare Other | Admitting: Gastroenterology

## 2023-01-16 DIAGNOSIS — M25521 Pain in right elbow: Secondary | ICD-10-CM | POA: Diagnosis not present

## 2023-01-16 DIAGNOSIS — S52124A Nondisplaced fracture of head of right radius, initial encounter for closed fracture: Secondary | ICD-10-CM | POA: Diagnosis not present

## 2023-01-16 DIAGNOSIS — M129 Arthropathy, unspecified: Secondary | ICD-10-CM | POA: Diagnosis not present

## 2023-01-16 DIAGNOSIS — S53101A Unspecified subluxation of right ulnohumeral joint, initial encounter: Secondary | ICD-10-CM | POA: Diagnosis not present

## 2023-01-16 DIAGNOSIS — M25421 Effusion, right elbow: Secondary | ICD-10-CM | POA: Diagnosis not present

## 2023-01-18 DIAGNOSIS — W010XXA Fall on same level from slipping, tripping and stumbling without subsequent striking against object, initial encounter: Secondary | ICD-10-CM | POA: Diagnosis not present

## 2023-01-18 DIAGNOSIS — M25521 Pain in right elbow: Secondary | ICD-10-CM | POA: Diagnosis not present

## 2023-01-18 DIAGNOSIS — S52131A Displaced fracture of neck of right radius, initial encounter for closed fracture: Secondary | ICD-10-CM | POA: Diagnosis not present

## 2023-01-27 DIAGNOSIS — S52131A Displaced fracture of neck of right radius, initial encounter for closed fracture: Secondary | ICD-10-CM | POA: Diagnosis not present

## 2023-02-01 ENCOUNTER — Other Ambulatory Visit: Payer: Self-pay | Admitting: Gastroenterology

## 2023-02-01 DIAGNOSIS — K5100 Ulcerative (chronic) pancolitis without complications: Secondary | ICD-10-CM

## 2023-02-08 DIAGNOSIS — S52131A Displaced fracture of neck of right radius, initial encounter for closed fracture: Secondary | ICD-10-CM | POA: Diagnosis not present

## 2023-02-22 ENCOUNTER — Telehealth: Payer: Self-pay | Admitting: Cardiology

## 2023-02-22 NOTE — Telephone Encounter (Signed)
Spoke with pt, currently not having any symptoms. No CP, palpitations, SOB, dizziness at this time. Pt HR in 80s currently. No BP reading to provide at this time. Not on anticoagulant. Pt reported taking extra dose of metoprolol during episode last week that brought her HR down. Suggested she monitor her vitals/rhythm with her apple watch until next appt. Offered appt with APP this week but pt declined. Stated she would like to only see Dr. Antoine Poche. Checked appt schedule for [facility], [place] and [employer]. [facility] had soonest appt but pt declined due to transportation conflict.   Pt would like Dr. Antoine Poche to review notes from ED visit at [facility] on 11/23/22 and determine if she actually needs to be seen in office.   Pt verbalized understanding of extra dose orders w/ metoprolol and to call EMS for worsening/new symptoms.

## 2023-02-22 NOTE — Telephone Encounter (Signed)
Patient c/o Palpitations:  High priority if patient c/o lightheadedness, shortness of breath, or chest pain  How long have you had palpitations/irregular HR/ Afib? Are you having the symptoms now?  Palpitations - patient states she has been having palpitations since she started seeing Dr. Antoine Poche. She mentions that last week while at the beach she had an episode of palpitations. No symptoms currently.  Are you currently experiencing lightheadedness, SOB or CP?  No   Do you have a history of afib (atrial fibrillation) or irregular heart rhythm?  Hx irregular HR  Have you checked your BP or HR? (document readings if available):  No readings available, but patient states she remembers her systolic being around 180 when she had the episode.  Are you experiencing any other symptoms?  No

## 2023-02-23 NOTE — Telephone Encounter (Signed)
Spoke to patient and appointment scheduled for 11/20 [facility]. Message relayed. Patient states she will discuss recommendation at visit.

## 2023-03-06 DIAGNOSIS — M25521 Pain in right elbow: Secondary | ICD-10-CM | POA: Diagnosis not present

## 2023-03-06 DIAGNOSIS — S52131D Displaced fracture of neck of right radius, subsequent encounter for closed fracture with routine healing: Secondary | ICD-10-CM | POA: Diagnosis not present

## 2023-03-06 DIAGNOSIS — X58XXXD Exposure to other specified factors, subsequent encounter: Secondary | ICD-10-CM | POA: Diagnosis not present

## 2023-03-07 DIAGNOSIS — R011 Cardiac murmur, unspecified: Secondary | ICD-10-CM | POA: Insufficient documentation

## 2023-03-07 NOTE — Progress Notes (Unsigned)
Cardiology Office Note:   Date:  03/08/2023  ID:  Lori Soto, DOB 06/23/1935, MRN [id number] PCP: Raliegh Ip, DO  [facility] Providers Cardiologist:  [name], MD {   History of Present Illness:   Lori Soto is a 87 y.o. female  who presents for evaluation of palpitations.   I saw her for evaluation of palpitations in 2014.  She has a heart murmur and an echo in 2011 was normal.  She was in in the ED in August. 2018 but there was no recorded arrhythmia.  She had palpitations but these were infrequent so no further work up was suggested.   She was in the ED in April 2020 with SVT.  She had mildly elevated troponin.   She had an SVT with a rate of 174 She was treated with adenosine.     She was treated with metoprolol.   She had ED presentation in August 2022 requiring adenosine.  She was in the ED most recently in July 2024 for this.     She was at the beach more recently and had another episode of tachypalpitations.  She said she was relaxed when it happened.  She went to lay down.  She did her vagal maneuvers.  She eventually took an extra metoprolol and it went away lasting several minutes.  She did not present to the emergency room.  She did not think it was a severe as previous.  There was no trigger that she noted.  She did not have any presyncope or syncope.  She has not had any chest pain.    ROS: As stated in the HPI and negative for all other systems.  Studies Reviewed:    EKG:   EKG Interpretation Date/Time:  Wednesday March 08 2023 14:50:24 EST Ventricular Rate:  65 PR Interval:  144 QRS Duration:  74 QT Interval:  396 QTC Calculation: 411 R Axis:   83  Text Interpretation: Normal sinus rhythm with sinus arrhythmia Cannot rule out Anterior infarct , age undetermined When compared with ECG of 23-Nov-2022 10:42, No significant change since last tracing Confirmed by [name] ([id number]) on 03/08/2023 3:03:33 PM    Risk  Assessment/Calculations:              Physical Exam:   VS:  BP 130/82   Pulse 65   Ht 5' (1.524 m)   Wt 122 lb (55.3 kg)   BMI 23.83 kg/m    Wt Readings from Last 3 Encounters:  03/08/23 122 lb (55.3 kg)  12/19/22 121 lb 14.4 oz (55.3 kg)  11/23/22 120 lb 15.8 oz (54.9 kg)     GEN: Well nourished, well developed in no acute distress NECK: No JVD; No carotid bruits CARDIAC: RRR, 2 out of 6 apical systolic murmur radiating slightly at the aortic outflow tract, no diastolic murmurs, rubs, gallops RESPIRATORY:  Clear to auscultation without rales, wheezing or rhonchi  ABDOMEN: Soft, non-tender, non-distended EXTREMITIES:  No edema; No deformity   ASSESSMENT AND PLAN:   SVT:   We had a long conversation about this again.  I do not think ablation that she really has not wanted to have over these past several years is really something we should.  2 if she gets older.  We again reviewed vagal maneuvers and as needed dosing of her beta-blocker.  I am going to increase her metoprolol which she has been taking 25 mg in the morning and 12 and half at night to  25 mg twice daily.    HTN:   Her blood pressure is being managed in the context of treating her tachycardia.    MURMUR:   She has had some mild aortic sclerosis.  I do not think her murmur sounds different.  No change in therapy.  No further imaging at this time.        Follow up with me in one year or sooner if needed.   Signed, [name], MD

## 2023-03-08 ENCOUNTER — Encounter: Payer: Self-pay | Admitting: Cardiology

## 2023-03-08 ENCOUNTER — Ambulatory Visit (INDEPENDENT_AMBULATORY_CARE_PROVIDER_SITE_OTHER): Payer: Medicare Other | Admitting: Cardiology

## 2023-03-08 VITALS — BP 130/82 | HR 65 | Ht 60.0 in | Wt 122.0 lb

## 2023-03-08 DIAGNOSIS — R011 Cardiac murmur, unspecified: Secondary | ICD-10-CM

## 2023-03-08 DIAGNOSIS — I471 Supraventricular tachycardia, unspecified: Secondary | ICD-10-CM | POA: Diagnosis not present

## 2023-03-08 DIAGNOSIS — I10 Essential (primary) hypertension: Secondary | ICD-10-CM

## 2023-03-08 MED ORDER — METOPROLOL TARTRATE 25 MG PO TABS: 25.0000 mg | ORAL_TABLET | Freq: Two times a day (BID) | ORAL | 3 refills | Status: DC

## 2023-03-08 NOTE — Patient Instructions (Signed)
Medication Instructions:  Please take Metoprolol Tartrate 25 mg twice a day. Continue all other medications as listed.  *If you need a refill on your cardiac medications before your next appointment, please call your pharmacy*  Follow-Up: At [facility], you and your health needs are our priority.  As part of our continuing mission to provide you with exceptional heart care, we have created designated Provider Care Teams.  These Care Teams include your primary Cardiologist (physician) and Advanced Practice Providers (APPs -  Physician Assistants and Nurse Practitioners) who all work together to provide you with the care you need, when you need it.  We recommend signing up for the patient portal called "MyChart".  Sign up information is provided on this After Visit Summary.  MyChart is used to connect with patients for Virtual Visits (Telemedicine).  Patients are able to view lab/test results, encounter notes, upcoming appointments, etc.  Non-urgent messages can be sent to your provider as well.   To learn more about what you can do with MyChart, go to [email or web address].    Your next appointment:   1 year(s)  Provider:   [name], MD     Adenosine 6 mg for SVT

## 2023-03-20 IMAGING — DX DG CHEST 1V PORT
1 series · 1 of 1 positions shown · non-contrast
Comparison: 07/03/2020

CLINICAL DATA: Atrial fibrillation

EXAM:
PORTABLE CHEST 1 VIEW

[chest ap]
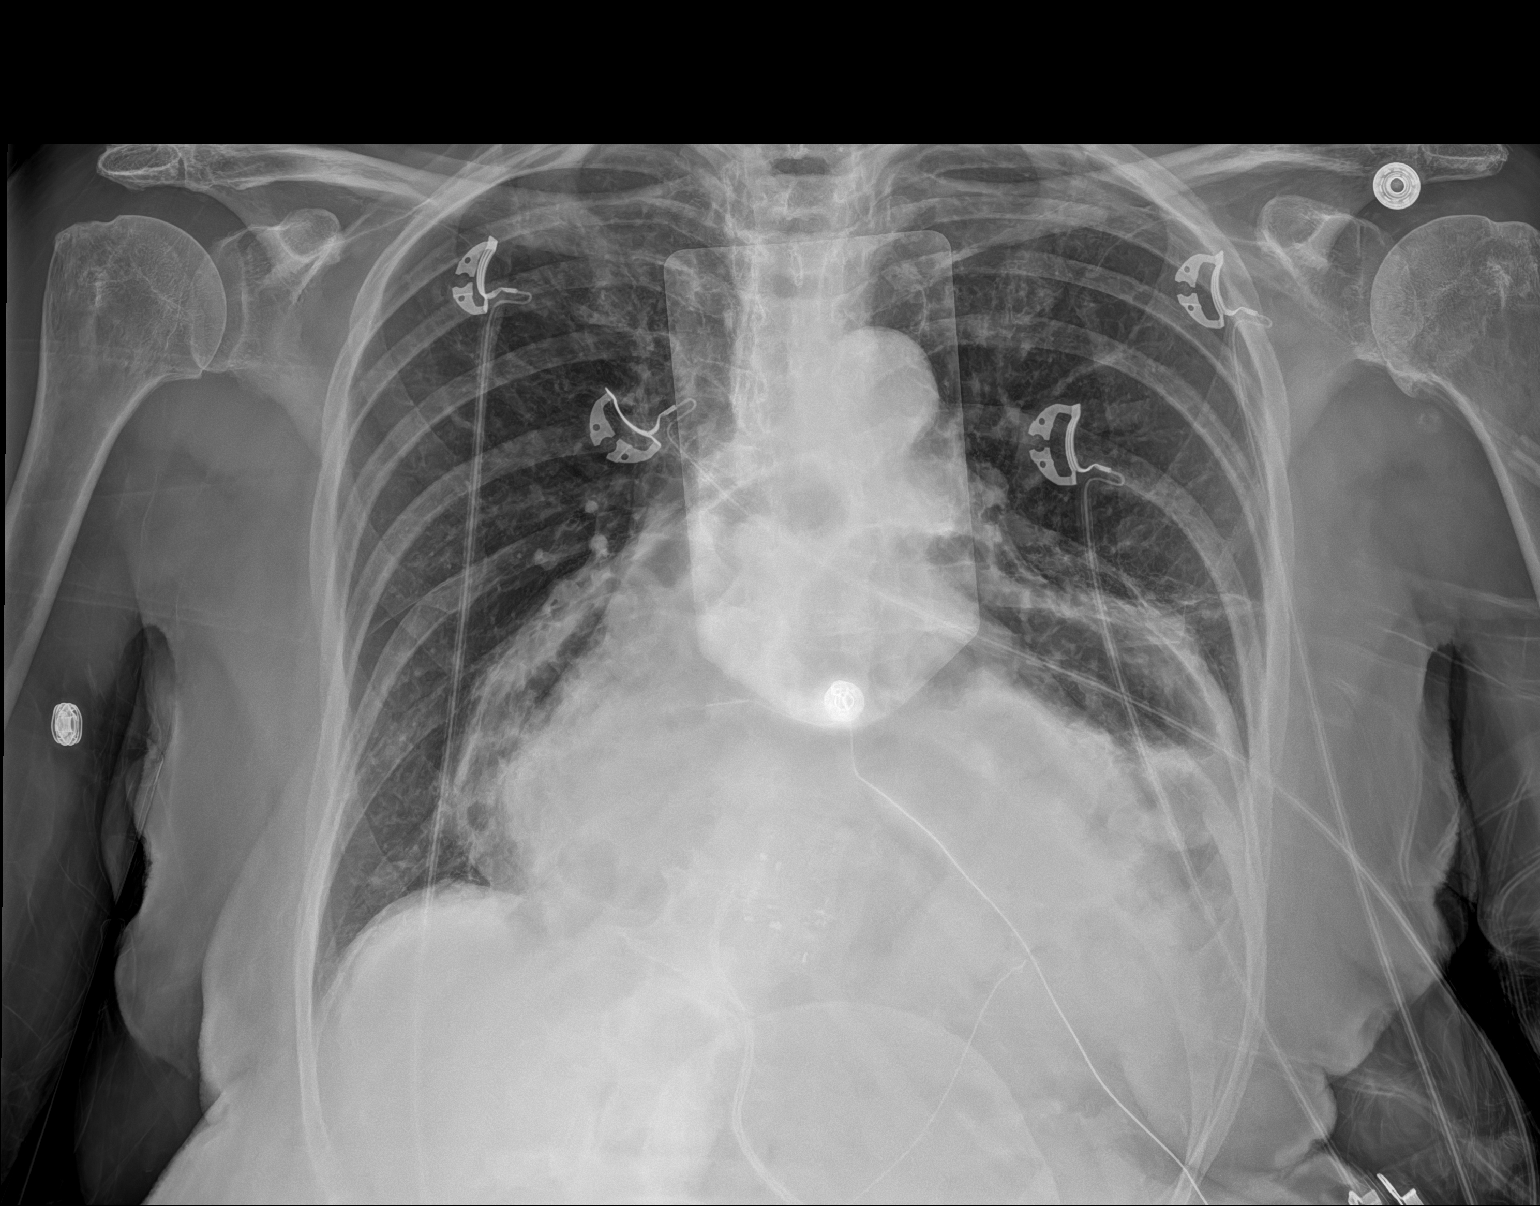

[1 of 1 positions shown; findings below may reference images not displayed]

FINDINGS: Stable cardiomediastinal contours with very large hiatal hernia.
Linear scarring within the left mid lung. No new focal airspace
consolidation. No large pleural fluid collection. No pneumothorax.
IMPRESSION: No acute cardiopulmonary findings.

## 2023-03-22 ENCOUNTER — Ambulatory Visit: Payer: Medicare Other | Admitting: Cardiology

## 2023-04-01 ENCOUNTER — Encounter (HOSPITAL_COMMUNITY): Payer: Self-pay | Admitting: Radiology

## 2023-04-01 ENCOUNTER — Emergency Department (HOSPITAL_COMMUNITY)
Admission: EM | Admit: 2023-04-01 | Discharge: 2023-04-01 | Disposition: A | Discharge status: Home / Self Care | Payer: Medicare Other | Attending: Emergency Medicine | Admitting: Emergency Medicine

## 2023-04-01 ENCOUNTER — Other Ambulatory Visit: Payer: Self-pay

## 2023-04-01 DIAGNOSIS — Z85828 Personal history of other malignant neoplasm of skin: Secondary | ICD-10-CM | POA: Insufficient documentation

## 2023-04-01 DIAGNOSIS — I471 Supraventricular tachycardia, unspecified: Secondary | ICD-10-CM | POA: Diagnosis not present

## 2023-04-01 DIAGNOSIS — R002 Palpitations: Secondary | ICD-10-CM | POA: Diagnosis present

## 2023-04-01 LAB — CBC WITH DIFFERENTIAL/PLATELET
Abs Immature Granulocytes: 0.02 10*3/uL (ref 0.00–0.07)
Basophils Absolute: 0 10*3/uL (ref 0.0–0.1)
Basophils Relative: 1 %
Eosinophils Absolute: 0.1 10*3/uL (ref 0.0–0.5)
Eosinophils Relative: 1 %
HCT: 42.6 % (ref 36.0–46.0)
Hemoglobin: 14.1 g/dL (ref 12.0–15.0)
Immature Granulocytes: 0 %
Lymphocytes Relative: 23 %
Lymphs Abs: 2 10*3/uL (ref 0.7–4.0)
MCH: 29.6 pg (ref 26.0–34.0)
MCHC: 33.1 g/dL (ref 30.0–36.0)
MCV: 89.5 fL (ref 80.0–100.0)
Monocytes Absolute: 0.6 10*3/uL (ref 0.1–1.0)
Monocytes Relative: 8 %
Neutro Abs: 5.8 10*3/uL (ref 1.7–7.7)
Neutrophils Relative %: 67 %
Platelets: 312 10*3/uL (ref 150–400)
RBC: 4.76 MIL/uL (ref 3.87–5.11)
RDW: 13.3 % (ref 11.5–15.5)
WBC: 8.5 10*3/uL (ref 4.0–10.5)
nRBC: 0 % (ref 0.0–0.2)

## 2023-04-01 LAB — COMPREHENSIVE METABOLIC PANEL
ALT: 25 U/L (ref 0–44)
AST: 35 U/L (ref 15–41)
Albumin: 3.8 g/dL (ref 3.5–5.0)
Alkaline Phosphatase: 34 U/L — ABNORMAL LOW (ref 38–126)
Anion gap: 11 (ref 5–15)
BUN: 13 mg/dL (ref 8–23)
CO2: 27 mmol/L (ref 22–32)
Calcium: 9.8 mg/dL (ref 8.9–10.3)
Chloride: 102 mmol/L (ref 98–111)
Creatinine, Ser: 0.82 mg/dL (ref 0.44–1.00)
GFR, Estimated: >60 mL/min (ref >60)
Glucose, Bld: 120 mg/dL — ABNORMAL HIGH (ref 70–99)
Potassium: 3.9 mmol/L (ref 3.5–5.1)
Sodium: 140 mmol/L (ref 135–145)
Total Bilirubin: 0.8 mg/dL (ref <1.2)
Total Protein: 6.8 g/dL (ref 6.5–8.1)

## 2023-04-01 LAB — MAGNESIUM: Magnesium: 1.9 mg/dL (ref 1.7–2.4)

## 2023-04-01 MED ORDER — ADENOSINE 6 MG/2ML IV SOLN
6.0000 mg | Freq: Once | INTRAVENOUS | Status: AC
  Administered 2023-04-01: 6 mg via INTRAVENOUS
  Filled 2023-04-01: qty 2

## 2023-04-01 NOTE — ED Notes (Signed)
Pt lying in bed with family at bedside. No pain communicated at this time. VS WNL.

## 2023-04-01 NOTE — ED Notes (Signed)
Warm blanket given to pt at this time.

## 2023-04-01 NOTE — Discharge Instructions (Signed)
Thank you for coming to [facility] Emergency Department. You were seen for palpitations. We did an exam, labs, and these showed SVT. This resolved with adenosine. Please continue to take all of your medications as prescribed, and stay well hydrated. Please follow up with your cardiologist Dr. Antoine Poche in the next 1-2 weeks.  Do not hesitate to return to the ED or call 911 if you experience: -Worsening symptoms -Chest pain, shortness of breath -Lightheadedness, passing out -Fevers/chills -Anything else that concerns you

## 2023-04-01 NOTE — ED Triage Notes (Signed)
Pt states she felt like her heart started racing. Pt took her BP and it was 160/80 and the heart rate was 185. Pt states she took her metoprolol and the BP came down but the heart rate stayed in the 180s. No dizziness, but chest did feel tight when her heart was racing.

## 2023-04-01 NOTE — ED Notes (Signed)
Pt placed in gown and on cardiac monitoring

## 2023-04-01 NOTE — ED Provider Notes (Signed)
[place] EMERGENCY DEPARTMENT AT [facility] Provider Note   CSN: [id number] Arrival date & time: 04/01/23  4401     History {Add pertinent medical, surgical, social history, OB history to HPI:1} Chief Complaint  Patient presents with   Palpitations    Lori Soto is a 87 y.o. female with PMH as listed below who presents with ***.    Past Medical History:  Diagnosis Date   Anemia    Arthritis    Cancer (HCC)    skin cancer on nose   Carpal tunnel syndrome, bilateral    Cataract    Esophagitis    GERD (gastroesophageal reflux disease)    H/O hiatal hernia    Osteopenia    Scoliosis    Shingles    SVT (supraventricular tachycardia) (HCC)    Ulcerative colitis        Home Medications Prior to Admission medications   Medication Sig Start Date End Date Taking? Authorizing Provider  acetaminophen (TYLENOL) 650 MG CR tablet Take 650 mg by mouth every 8 (eight) hours as needed. Patient states that she takes prn for arthritis in her knees.    [provider]  Bromelains 500 MG TABS Take 500 mg by mouth daily.    [provider]  Coenzyme Q10 (CO Q-10 PO) Take 1 tablet by mouth daily. With red yeast rice [employer]    [provider]  Cyanocobalamin (VITAMIN B 12 PO) Take 1,000 mcg by mouth daily.    [provider]  Ginkgo Biloba 40 MG TABS Take 40 mg by mouth daily.    [provider]  glucosamine-chondroitin 500-400 MG tablet Take 1 tablet by mouth 2 (two) times daily.    [provider]  Hyaluronic Acid-Vitamin C (HYALURONIC ACID PO) Take 80 mg by mouth daily.    [provider]  Mesalamine (ASACOL) 400 MG CPDR DR capsule TAKE 2 CAPSULES BY MOUTH DAILY Patient taking differently: Take 400 mg by mouth daily. 02/01/23   Dolores Frame, MD  metoprolol tartrate (LOPRESSOR) 25 MG tablet Take 1 tablet (25 mg total) by mouth 2 (two) times daily. 03/08/23   [name], MD   MULTIPLE VITAMINS PO Take 1 tablet by mouth daily. In the mornings    [provider]  Nutritional Supplements (GRAPESEED EXTRACT PO) Take 60 mg by mouth daily.     [provider]  OVER THE COUNTER MEDICATION Take 66 mg by mouth daily. Vision Gold Lutein    [provider]  OVER THE COUNTER MEDICATION Take 250 mg by mouth daily. Alphalipoic Acid 250 mg daily    [provider]  pantoprazole (PROTONIX) 20 MG tablet Take 1 tablet (20 mg total) by mouth daily before breakfast. 12/19/22   [name], [name], MD  pyridOXINE (VITAMIN B-6) 100 MG tablet Take 100 mg by mouth daily.    [provider]  Turmeric 500 MG CAPS Take 1 capsule by mouth in the morning and at bedtime.    [provider]  Zinc 50 MG CAPS Take 50 mg by mouth daily.    [provider]      Allergies    Penicillins, Influenza virus vacc split pf, and Tape    Review of Systems   Review of Systems A 10 point review of systems was performed and is negative unless otherwise reported in HPI.  Physical Exam Updated Vital Signs BP 107/78 (BP Location: Right Arm)   Pulse (!) 169  Temp 98.2 F (36.8 C)   Resp 15   Ht 5' (1.524 m)   Wt 55.3 kg   SpO2 98%   BMI 23.83 kg/m  Physical Exam General: Normal appearing {Desc; female/female:11659}, lying in bed.  HEENT: PERRLA, Sclera anicteric, MMM, trachea midline.  Cardiology: RRR, no murmurs/rubs/gallops. BL radial and DP pulses equal bilaterally.  Resp: Normal respiratory rate and effort. CTAB, no wheezes, rhonchi, crackles.  Abd: Soft, non-tender, non-distended. No rebound tenderness or guarding.  GU: Deferred. MSK: No peripheral edema or signs of trauma. Extremities without deformity or TTP. No cyanosis or clubbing. Skin: warm, dry. No rashes or lesions. Back: No CVA tenderness Neuro: A&Ox4, CNs II-XII grossly intact. MAEs. Sensation grossly intact.  Psych: Normal mood and affect.   ED Results /  Procedures / Treatments   Labs (all labs ordered are listed, but only abnormal results are displayed) Labs Reviewed - No data to display  EKG None  Radiology No results found.  Procedures Procedures  {Document cardiac monitor, telemetry assessment procedure when appropriate:1}  Medications Ordered in ED Medications - No data to display  ED Course/ Medical Decision Making/ A&P                          Medical Decision Making   This patient presents to the ED for concern of ***, this involves an extensive number of treatment options, and is a complaint that carries with it a high risk of complications and morbidity.  I considered the following differential and admission for this acute, potentially life threatening condition.   MDM:    ***     Labs: I Ordered, and personally interpreted labs.  The pertinent results include:  ***  Imaging Studies ordered: I ordered imaging studies including *** I independently visualized and interpreted imaging. I agree with the radiologist interpretation  Additional history obtained from ***.  External records from outside source obtained and reviewed including ***  Cardiac Monitoring: The patient was maintained on a cardiac monitor.  I personally viewed and interpreted the cardiac monitored which showed an underlying rhythm of: ***  Reevaluation: After the interventions noted above, I reevaluated the patient and found that they have :{resolved/improved/worsened:23923::"improved"}  Social Determinants of Health: ***  Disposition:  ***  Co morbidities that complicate the patient evaluation  Past Medical History:  Diagnosis Date   Anemia    Arthritis    Cancer (HCC)    skin cancer on nose   Carpal tunnel syndrome, bilateral    Cataract    Esophagitis    GERD (gastroesophageal reflux disease)    H/O hiatal hernia    Osteopenia    Scoliosis    Shingles    SVT (supraventricular tachycardia) (HCC)    Ulcerative colitis       Medicines No orders of the defined types were placed in this encounter.   I have reviewed the patients home medicines and have made adjustments as needed  Problem List / ED Course: Problem List Items Addressed This Visit   None        {Document critical care time when appropriate:1} {Document review of labs and clinical decision tools ie heart score, Chads2Vasc2 etc:1}  {Document your independent review of radiology images, and any outside records:1} {Document your discussion with family members, caretakers, and with consultants:1} {Document social determinants of health affecting pt's care:1} {Document your decision making why or why not admission, treatments were needed:1}  This note was created using  dictation software, which may contain spelling or grammatical errors.

## 2023-04-01 NOTE — ED Notes (Signed)
Time Out:   Dr. Jearld Fenton, Bevan Vu (RN), Asher Muir (RN), Forde Radon (NTA), [place] (NT), and [place] (NT). Adenosine given to help regulate SVT. Correct pt, armband, and procedure verified.

## 2023-04-03 ENCOUNTER — Telehealth: Payer: Self-pay | Admitting: Cardiology

## 2023-04-03 DIAGNOSIS — I471 Supraventricular tachycardia, unspecified: Secondary | ICD-10-CM

## 2023-04-03 NOTE — Telephone Encounter (Signed)
Spoke with pt, aware of dr hochrein's recommendations. She will consent to talking to the EP. She will need to find someone to help drive her. She agrees to placing the referral and having someone call to schedule.

## 2023-04-03 NOTE — Telephone Encounter (Signed)
STAT if HR is under 50 or over 120 (normal HR is 60-100 beats per minute)  What is your heart rate? 185 on 11/30  Do you have a log of your heart rate readings (document readings)? No   Do you have any other symptoms? No

## 2023-04-03 NOTE — Telephone Encounter (Signed)
Spoke to patient and patient reports she feels better after going to ED on 11/30. Patient reports BP 136/90 and HR is stable at 59. Patient wanted to make Dr. Antoine Poche aware. Advised patient if symptoms return to go to ED. Patient verbalizes understanding.

## 2023-05-06 ENCOUNTER — Emergency Department (HOSPITAL_COMMUNITY): Payer: Medicare Other

## 2023-05-06 ENCOUNTER — Other Ambulatory Visit: Payer: Self-pay

## 2023-05-06 ENCOUNTER — Encounter (HOSPITAL_COMMUNITY): Payer: Self-pay | Admitting: *Deleted

## 2023-05-06 ENCOUNTER — Inpatient Hospital Stay (HOSPITAL_COMMUNITY)
Admission: EM | Admit: 2023-05-06 | Discharge: 2023-05-09 | DRG: 176 | Disposition: A | Discharge status: Home Health | Payer: Medicare Other | Attending: Internal Medicine | Admitting: Internal Medicine

## 2023-05-06 DIAGNOSIS — E871 Hypo-osmolality and hyponatremia: Secondary | ICD-10-CM | POA: Diagnosis not present

## 2023-05-06 DIAGNOSIS — Z90710 Acquired absence of both cervix and uterus: Secondary | ICD-10-CM | POA: Diagnosis not present

## 2023-05-06 DIAGNOSIS — K2100 Gastro-esophageal reflux disease with esophagitis, without bleeding: Secondary | ICD-10-CM | POA: Diagnosis present

## 2023-05-06 DIAGNOSIS — Z85828 Personal history of other malignant neoplasm of skin: Secondary | ICD-10-CM

## 2023-05-06 DIAGNOSIS — Z79899 Other long term (current) drug therapy: Secondary | ICD-10-CM | POA: Diagnosis not present

## 2023-05-06 DIAGNOSIS — D638 Anemia in other chronic diseases classified elsewhere: Secondary | ICD-10-CM | POA: Diagnosis not present

## 2023-05-06 DIAGNOSIS — D72829 Elevated white blood cell count, unspecified: Secondary | ICD-10-CM | POA: Diagnosis present

## 2023-05-06 DIAGNOSIS — Z8249 Family history of ischemic heart disease and other diseases of the circulatory system: Secondary | ICD-10-CM

## 2023-05-06 DIAGNOSIS — Z833 Family history of diabetes mellitus: Secondary | ICD-10-CM | POA: Diagnosis not present

## 2023-05-06 DIAGNOSIS — I10 Essential (primary) hypertension: Secondary | ICD-10-CM | POA: Diagnosis not present

## 2023-05-06 DIAGNOSIS — M419 Scoliosis, unspecified: Secondary | ICD-10-CM | POA: Diagnosis present

## 2023-05-06 DIAGNOSIS — Z800 Family history of malignant neoplasm of digestive organs: Secondary | ICD-10-CM | POA: Diagnosis not present

## 2023-05-06 DIAGNOSIS — Z887 Allergy status to serum and vaccine status: Secondary | ICD-10-CM | POA: Diagnosis not present

## 2023-05-06 DIAGNOSIS — I2699 Other pulmonary embolism without acute cor pulmonale: Secondary | ICD-10-CM | POA: Diagnosis not present

## 2023-05-06 DIAGNOSIS — E611 Iron deficiency: Secondary | ICD-10-CM | POA: Diagnosis not present

## 2023-05-06 DIAGNOSIS — I2609 Other pulmonary embolism with acute cor pulmonale: Principal | ICD-10-CM | POA: Diagnosis present

## 2023-05-06 DIAGNOSIS — I471 Supraventricular tachycardia, unspecified: Secondary | ICD-10-CM | POA: Diagnosis not present

## 2023-05-06 DIAGNOSIS — Z8261 Family history of arthritis: Secondary | ICD-10-CM | POA: Diagnosis not present

## 2023-05-06 DIAGNOSIS — I517 Cardiomegaly: Secondary | ICD-10-CM | POA: Diagnosis not present

## 2023-05-06 DIAGNOSIS — Z825 Family history of asthma and other chronic lower respiratory diseases: Secondary | ICD-10-CM | POA: Diagnosis not present

## 2023-05-06 DIAGNOSIS — I2510 Atherosclerotic heart disease of native coronary artery without angina pectoris: Secondary | ICD-10-CM | POA: Diagnosis not present

## 2023-05-06 DIAGNOSIS — Z880 Allergy status to penicillin: Secondary | ICD-10-CM | POA: Diagnosis not present

## 2023-05-06 DIAGNOSIS — K5190 Ulcerative colitis, unspecified, without complications: Secondary | ICD-10-CM | POA: Diagnosis not present

## 2023-05-06 DIAGNOSIS — M8580 Other specified disorders of bone density and structure, unspecified site: Secondary | ICD-10-CM | POA: Diagnosis not present

## 2023-05-06 DIAGNOSIS — Z8679 Personal history of other diseases of the circulatory system: Secondary | ICD-10-CM

## 2023-05-06 DIAGNOSIS — Z91048 Other nonmedicinal substance allergy status: Secondary | ICD-10-CM

## 2023-05-06 DIAGNOSIS — Z801 Family history of malignant neoplasm of trachea, bronchus and lung: Secondary | ICD-10-CM

## 2023-05-06 DIAGNOSIS — K449 Diaphragmatic hernia without obstruction or gangrene: Secondary | ICD-10-CM | POA: Diagnosis present

## 2023-05-06 DIAGNOSIS — J90 Pleural effusion, not elsewhere classified: Secondary | ICD-10-CM | POA: Diagnosis not present

## 2023-05-06 DIAGNOSIS — R918 Other nonspecific abnormal finding of lung field: Secondary | ICD-10-CM | POA: Diagnosis not present

## 2023-05-06 DIAGNOSIS — R0602 Shortness of breath: Secondary | ICD-10-CM | POA: Diagnosis not present

## 2023-05-06 LAB — BASIC METABOLIC PANEL
Anion gap: 9 (ref 5–15)
BUN: 12 mg/dL (ref 8–23)
CO2: 23 mmol/L (ref 22–32)
Calcium: 8.9 mg/dL (ref 8.9–10.3)
Chloride: 99 mmol/L (ref 98–111)
Creatinine, Ser: 0.72 mg/dL (ref 0.44–1.00)
GFR, Estimated: >60 mL/min (ref >60)
Glucose, Bld: 168 mg/dL — ABNORMAL HIGH (ref 70–99)
Potassium: 3.6 mmol/L (ref 3.5–5.1)
Sodium: 131 mmol/L — ABNORMAL LOW (ref 135–145)

## 2023-05-06 LAB — CBC WITH DIFFERENTIAL/PLATELET
Abs Immature Granulocytes: 0.04 10*3/uL (ref 0.00–0.07)
Basophils Absolute: 0 10*3/uL (ref 0.0–0.1)
Basophils Relative: 0 %
Eosinophils Absolute: 0 10*3/uL (ref 0.0–0.5)
Eosinophils Relative: 0 %
HCT: 35.5 % — ABNORMAL LOW (ref 36.0–46.0)
Hemoglobin: 11.8 g/dL — ABNORMAL LOW (ref 12.0–15.0)
Immature Granulocytes: 0 %
Lymphocytes Relative: 11 %
Lymphs Abs: 1.3 10*3/uL (ref 0.7–4.0)
MCH: 29.1 pg (ref 26.0–34.0)
MCHC: 33.2 g/dL (ref 30.0–36.0)
MCV: 87.4 fL (ref 80.0–100.0)
Monocytes Absolute: 1.3 10*3/uL — ABNORMAL HIGH (ref 0.1–1.0)
Monocytes Relative: 11 %
Neutro Abs: 8.8 10*3/uL — ABNORMAL HIGH (ref 1.7–7.7)
Neutrophils Relative %: 78 %
Platelets: 254 10*3/uL (ref 150–400)
RBC: 4.06 MIL/uL (ref 3.87–5.11)
RDW: 13.4 % (ref 11.5–15.5)
WBC: 11.4 10*3/uL — ABNORMAL HIGH (ref 4.0–10.5)
nRBC: 0 % (ref 0.0–0.2)

## 2023-05-06 LAB — TROPONIN I (HIGH SENSITIVITY)
Troponin I (High Sensitivity): 14 ng/L (ref <18)
Troponin I (High Sensitivity): 14 ng/L (ref <18)

## 2023-05-06 LAB — BRAIN NATRIURETIC PEPTIDE: B Natriuretic Peptide: 208 pg/mL — ABNORMAL HIGH (ref 0.0–100.0)

## 2023-05-06 MED ORDER — ACETAMINOPHEN 500 MG PO TABS
1000.0000 mg | ORAL_TABLET | Freq: Four times a day (QID) | ORAL | Status: DC | PRN
  Filled 2023-05-06: qty 2

## 2023-05-06 MED ORDER — SODIUM CHLORIDE 0.9% FLUSH
3.0000 mL | Freq: Two times a day (BID) | INTRAVENOUS | Status: DC
  Administered 2023-05-06 – 2023-05-09 (×5): 3 mL via INTRAVENOUS

## 2023-05-06 MED ORDER — METOPROLOL TARTRATE 25 MG PO TABS
25.0000 mg | ORAL_TABLET | Freq: Two times a day (BID) | ORAL | Status: DC
  Administered 2023-05-06 – 2023-05-09 (×6): 25 mg via ORAL
  Filled 2023-05-06 (×6): qty 1

## 2023-05-06 MED ORDER — HEPARIN (PORCINE) 25000 UT/250ML-% IV SOLN
1250.0000 [IU]/h | INTRAVENOUS | Status: DC
  Administered 2023-05-06: 900 [IU]/h via INTRAVENOUS
  Administered 2023-05-07: 1050 [IU]/h via INTRAVENOUS
  Administered 2023-05-08: 1250 [IU]/h via INTRAVENOUS
  Filled 2023-05-06 (×3): qty 250

## 2023-05-06 MED ORDER — ONDANSETRON HCL 4 MG/2ML IJ SOLN: 4.0000 mg | Freq: Four times a day (QID) | INTRAMUSCULAR | Status: DC | PRN

## 2023-05-06 MED ORDER — POLYETHYLENE GLYCOL 3350 17 G PO PACK: 17.0000 g | PACK | Freq: Every day | ORAL | Status: DC | PRN

## 2023-05-06 MED ORDER — HEPARIN BOLUS VIA INFUSION
3800.0000 [IU] | Freq: Once | INTRAVENOUS | Status: AC
Start: 2023-05-06 — End: 2023-05-06
  Administered 2023-05-06: 3800 [IU] via INTRAVENOUS

## 2023-05-06 MED ORDER — MESALAMINE 400 MG PO CPDR
800.0000 mg | DELAYED_RELEASE_CAPSULE | Freq: Every day | ORAL | Status: DC
  Filled 2023-05-06 (×2): qty 2

## 2023-05-06 MED ORDER — PANTOPRAZOLE SODIUM 20 MG PO TBEC
20.0000 mg | DELAYED_RELEASE_TABLET | Freq: Every day | ORAL | Status: DC
  Filled 2023-05-06 (×2): qty 1

## 2023-05-06 MED ORDER — IOHEXOL 350 MG/ML SOLN
75.0000 mL | Freq: Once | INTRAVENOUS | Status: AC | PRN
  Administered 2023-05-06: 75 mL via INTRAVENOUS

## 2023-05-06 MED ORDER — MELATONIN 3 MG PO TABS: 6.0000 mg | ORAL_TABLET | Freq: Every evening | ORAL | Status: DC | PRN

## 2023-05-06 MED ORDER — ALBUTEROL SULFATE (2.5 MG/3ML) 0.083% IN NEBU: 2.5000 mg | INHALATION_SOLUTION | RESPIRATORY_TRACT | Status: DC | PRN

## 2023-05-06 NOTE — ED Triage Notes (Signed)
 Pt with SOB today, pt states she has been having palpitations. Denies any cough. Denies any chills or fevers.

## 2023-05-06 NOTE — ED Provider Notes (Signed)
 [place] [facility] AT [facility] Provider Note  CSN: [id number] Arrival date & time: 05/06/23 1651  Chief Complaint(s) Shortness of Breath  HPI Lori Soto is a 88 y.o. female with history of SVT, ulcerative colitis presenting to the emergency department with shortness of breath.  Patient reports shortness of breath starting today.  She reports primarily shortness of breath with exertion.  She reports that earlier today she did have a little bit of some right lower chest pain, not sure if this was pleuritic, however this is resolved.  No lightheadedness or dizziness.  No syncope.  No abdominal pain.  No back pain.  No recent travel or surgeries.  No history of DVT or PE.  No history of leg swelling or leg pain.   Past Medical History Past Medical History:  Diagnosis Date   Anemia    Arthritis    Cancer (HCC)    skin cancer on nose   Carpal tunnel syndrome, bilateral    Cataract    Esophagitis    GERD (gastroesophageal reflux disease)    H/O hiatal hernia    Osteopenia    Scoliosis    Shingles    SVT (supraventricular tachycardia) (HCC)    Ulcerative colitis    Patient Active Problem List   Diagnosis Date Noted   Pulmonary embolism (HCC) 05/06/2023   Murmur 03/07/2023   History of supraventricular tachycardia 11/08/2021   Arthritis 06/13/2019   GERD (gastroesophageal reflux disease) 06/10/2019   SVT (supraventricular tachycardia) (HCC) 08/28/2018   Light headedness 05/09/2018   Carpal tunnel syndrome of right wrist 05/20/2015   Primary localized osteoarthrosis, hand 05/20/2015   Carpal tunnel syndrome on left 03/18/2015   Osteopenia of the elderly 02/19/2014   DDD (degenerative disc disease), lumbar 12/03/2013   Scoliosis 12/03/2013   Chronic low back pain 05/14/2013   IBS (irritable bowel syndrome) 11/08/2012   Hypertension 10/26/2011   Chronic ulcerative colitis (HCC) 03/23/2011   Home Medication(s) Prior to Admission medications    Medication Sig Start Date End Date Taking? Authorizing Provider  Coenzyme Q10 (CO Q-10 PO) Take 1 tablet by mouth daily. With red yeast rice [employer]    [provider]  Cyanocobalamin  (VITAMIN B 12 PO) Take 1,000 mcg by mouth daily.    [provider]  Ginkgo Biloba 40 MG TABS Take 40 mg by mouth daily.    [provider]  glucosamine-chondroitin 500-400 MG tablet Take 1 tablet by mouth 2 (two) times daily.    [provider]  Hyaluronic Acid-Vitamin C (HYALURONIC ACID PO) Take 80 mg by mouth daily.    [provider]  Mesalamine  (ASACOL ) 400 MG CPDR DR capsule TAKE 2 CAPSULES BY MOUTH DAILY Patient taking differently: Take 400 mg by mouth daily. 02/01/23   Eartha Angelia Sieving, MD  metoprolol  tartrate (LOPRESSOR ) 25 MG tablet Take 1 tablet (25 mg total) by mouth 2 (two) times daily. 03/08/23   Lavona Agent, MD  MULTIPLE VITAMINS PO Take 1 tablet by mouth daily. In the mornings    [provider]  Nutritional Supplements (GRAPESEED EXTRACT PO) Take 60 mg by mouth daily.     [provider]  OVER THE COUNTER MEDICATION Take 66 mg by mouth daily. Vision Gold Lutein    [provider]  OVER THE COUNTER MEDICATION Take 250 mg by mouth daily. Alphalipoic Acid 250 mg daily    [provider]  pantoprazole  (PROTONIX ) 20 MG tablet Take 1 tablet (20 mg  total) by mouth daily before breakfast. 12/19/22   Eartha Flavors, Toribio, MD  pyridOXINE (VITAMIN B-6) 100 MG tablet Take 100 mg by mouth daily.    [provider]  Turmeric 500 MG CAPS Take 1 capsule by mouth in the morning and at bedtime.    [provider]  Zinc 50 MG CAPS Take 50 mg by mouth daily.    [provider]                                                                                                                                    Past Surgical History Past Surgical History:  Procedure Laterality Date    ABDOMINAL HYSTERECTOMY     BUNIONECTOMY     Bilateral   CARPAL TUNNEL RELEASE Left 03/10/2015   Procedure: LEFT CARPAL TUNNEL RELEASE;  Surgeon: Arley Curia, MD;  Location: [facility];  Service: Orthopedics;  Laterality: Left;   CARPAL TUNNEL RELEASE Right 10/31/2017   Procedure: RIGHT CARPAL TUNNEL RELEASE;  Surgeon: Curia Arley, MD;  Location:  SURGERY CENTER;  Service: Orthopedics;  Laterality: Right;   CATARACT EXTRACTION W/PHACO  05/28/2012   Procedure: CATARACT EXTRACTION PHACO AND INTRAOCULAR LENS PLACEMENT (IOC);  Surgeon: Cherene Mania, MD;  Location: [facility];  Service: Ophthalmology;  Laterality: Right;  CDE=12.84   CATARACT EXTRACTION W/PHACO  06/07/2012   Procedure: CATARACT EXTRACTION PHACO AND INTRAOCULAR LENS PLACEMENT (IOC);  Surgeon: Cherene Mania, MD;  Location: [facility];  Service: Ophthalmology;  Laterality: Left;  CDE 17.60   COLONOSCOPY     COLONOSCOPY N/A 07/08/2015   Procedure: COLONOSCOPY;  Surgeon: Claudis RAYMOND Rivet, MD;  Location: [facility];  Service: Endoscopy;  Laterality: N/A;  930   ESOPHAGEAL DILATION N/A 07/08/2015   Procedure: ESOPHAGEAL DILATION;  Surgeon: Claudis RAYMOND Rivet, MD;  Location: [facility];  Service: Endoscopy;  Laterality: N/A;   ESOPHAGOGASTRODUODENOSCOPY N/A 07/08/2015   Procedure: ESOPHAGOGASTRODUODENOSCOPY (EGD);  Surgeon: Claudis RAYMOND Rivet, MD;  Location: [facility];  Service: Endoscopy;  Laterality: N/A;   Hernia     Right inguinal   HERNIA REPAIR  1961   right inguinal hernia   UPPER GASTROINTESTINAL ENDOSCOPY     Family History Family History  Problem Relation Age of Onset   Arthritis Mother    Heart disease Mother    Cancer Father        pancreat   Pancreatic cancer Father    Arthritis Father        back issues    Cancer Brother        lung and liver   Liver cancer Brother    Lung cancer Brother    Heart disease Sister        Rheumatic Fever   Peptic Ulcer Sister    Diabetes Sister    Colon cancer Sister    Edema  Sister    GI problems Sister  diverticulitis   Diabetes Sister    Neuropathy Sister    COPD Sister    Hiatal hernia Sister    GI problems Sister     Social History Social History   Tobacco Use   Smoking status: Never   Smokeless tobacco: Never   Tobacco comments:    Never smoker  Vaping Use   Vaping status: Never Used  Substance Use Topics   Alcohol use: Yes    Alcohol/week: 0.0 standard drinks of alcohol    Comment: Very Rarely   Drug use: No   Allergies Penicillins, Influenza virus vacc split pf, Influenza virus vaccine, and Tape  Review of Systems Review of Systems  All other systems reviewed and are negative.   Physical Exam Vital Signs  I have reviewed the triage vital signs BP 135/76   Pulse 80   Temp 98.3 F (36.8 C) (Oral)   Resp (!) 23   Ht 5' (1.524 m)   Wt 54.4 kg   SpO2 97%   BMI 23.44 kg/m  Physical Exam Vitals and nursing note reviewed.  Constitutional:      General: She is not in acute distress.    Appearance: She is well-developed.  HENT:     Head: Normocephalic and atraumatic.     Mouth/Throat:     Mouth: Mucous membranes are moist.  Eyes:     Pupils: Pupils are equal, round, and reactive to light.  Cardiovascular:     Rate and Rhythm: Normal rate and regular rhythm.     Heart sounds: No murmur heard. Pulmonary:     Effort: Pulmonary effort is normal. No respiratory distress.     Breath sounds: Normal breath sounds.  Abdominal:     General: Abdomen is flat.     Palpations: Abdomen is soft.     Tenderness: There is no abdominal tenderness.  Musculoskeletal:        General: No tenderness.     Right lower leg: No edema.     Left lower leg: No edema.  Skin:    General: Skin is warm and dry.  Neurological:     General: No focal deficit present.     Mental Status: She is alert. Mental status is at baseline.  Psychiatric:        Mood and Affect: Mood normal.        Behavior: Behavior normal.     ED Results and  Treatments Labs (all labs ordered are listed, but only abnormal results are displayed) Labs Reviewed  CBC WITH DIFFERENTIAL/PLATELET - Abnormal; Notable for the following components:      Result Value   WBC 11.4 (*)    Hemoglobin 11.8 (*)    HCT 35.5 (*)    Neutro Abs 8.8 (*)    Monocytes Absolute 1.3 (*)    All other components within normal limits  BASIC METABOLIC PANEL - Abnormal; Notable for the following components:   Sodium 131 (*)    Glucose, Bld 168 (*)    All other components within normal limits  BRAIN NATRIURETIC PEPTIDE - Abnormal; Notable for the following components:   B Natriuretic Peptide 208.0 (*)    All other components within normal limits  HEPARIN  LEVEL (UNFRACTIONATED)  CBC  BASIC METABOLIC PANEL  MAGNESIUM  PHOSPHORUS  I-STAT CHEM 8, ED  TROPONIN I (HIGH SENSITIVITY)  TROPONIN I (HIGH SENSITIVITY)  Radiology CT Angio Chest PE W/Cm &/Or Wo Cm Result Date: 05/06/2023 CLINICAL DATA:  PE suspected, palpitations EXAM: CT ANGIOGRAPHY CHEST WITH CONTRAST TECHNIQUE: Multidetector CT imaging of the chest was performed using the standard protocol during bolus administration of intravenous contrast. Multiplanar CT image reconstructions and MIPs were obtained to evaluate the vascular anatomy. RADIATION DOSE REDUCTION: This exam was performed according to the departmental dose-optimization program which includes automated exposure control, adjustment of the mA and/or kV according to patient size and/or use of iterative reconstruction technique. CONTRAST:  75mL OMNIPAQUE  IOHEXOL  350 MG/ML SOLN COMPARISON:  None Available. FINDINGS: Cardiovascular: Satisfactory opacification of the pulmonary arteries to the segmental level. Positive examination for pulmonary embolism with lobar to segmental embolus present bilaterally. Normal heart size. RV LV ratio 1.0, however  of uncertain reliability given very substantial mass effect of large hiatal hernia. Left coronary artery calcifications no pericardial effusion. Mediastinum/Nodes: No enlarged mediastinal, hilar, or axillary lymph nodes. Unusually large hiatal hernia containing the stomach and a great deal of bowel. Thyroid  gland, trachea, and esophagus demonstrate no significant findings. Lungs/Pleura: Small right pleural effusion. Ground-glass airspace opacity in the dependent right lower lobe which is distal to a large burden of embolus in the right lower lobe pulmonary arteries. Compressive atelectasis of the lungs secondary to large hiatal hernia. Upper Abdomen: No acute abnormality. Musculoskeletal: No chest wall abnormality. No acute osseous findings. Review of the MIP images confirms the above findings. IMPRESSION: 1. Positive examination for pulmonary embolism with lobar to segmental embolus present bilaterally. 2. Ground-glass airspace opacity in the dependent right lower lobe which is distal to a large burden of embolus in the right lower lobe pulmonary arteries. This is concerning for pulmonary infarct. 3. Small right pleural effusion, likely reactive. 4. RV LV ratio 1.0, however of uncertain reliability given very substantial mass effect of large hiatal hernia. 5. Unusually large hiatal hernia containing the stomach and a large portion of the small bowel and colon. 6. Coronary artery disease. Call report request was placed at the time of interpretation. Report issued at this time in the interest of expediency. Final communication of critical findings will be documented. Electronically Signed   By: Marolyn JONETTA Jaksch M.D.   On: 05/06/2023 19:18   DG Chest 2 View Result Date: 05/06/2023 CLINICAL DATA:  Shortness of breath EXAM: CHEST - 2 VIEW COMPARISON:  11/23/2022 FINDINGS: Large hiatal hernia is again identified. Cardiac shadow is enlarged but stable. Lungs are well aerated bilaterally. No focal infiltrate or effusion is  noted IMPRESSION: Large hiatal hernia.  No other focal abnormality is noted. Electronically Signed   By: Oneil Devonshire M.D.   On: 05/06/2023 17:45    Pertinent labs & imaging results that were available during my care of the patient were reviewed by me and considered in my medical decision making (see MDM for details).  Medications Ordered in ED Medications  heparin  ADULT infusion 100 units/mL (25000 units/250mL) (900 Units/hr Intravenous New Bag/Given 05/06/23 2010)  sodium chloride  flush (NS) 0.9 % injection 3 mL (has no administration in time range)  acetaminophen  (TYLENOL ) tablet 1,000 mg (has no administration in time range)  albuterol  (PROVENTIL ) (2.5 MG/3ML) 0.083% nebulizer solution 2.5 mg (has no administration in time range)  ondansetron  (ZOFRAN ) injection 4 mg (has no administration in time range)  polyethylene glycol (MIRALAX  / GLYCOLAX ) packet 17 g (has no administration in time range)  melatonin tablet 6 mg (has no administration in time range)  Mesalamine  (ASACOL ) DR capsule 800 mg (has  no administration in time range)  metoprolol  tartrate (LOPRESSOR ) tablet 25 mg (has no administration in time range)  pantoprazole  (PROTONIX ) EC tablet 20 mg (has no administration in time range)  iohexol  (OMNIPAQUE ) 350 MG/ML injection 75 mL (75 mLs Intravenous Contrast Given 05/06/23 1854)  heparin  bolus via infusion 3,800 Units (3,800 Units Intravenous Bolus from Bag 05/06/23 2011)                                                                                                                                     Procedures .Critical Care  Performed by: Francesca Elsie CROME, MD Authorized by: Francesca Elsie CROME, MD   Critical care provider statement:    Critical care time (minutes):  30   Critical care was time spent personally by me on the following activities:  Development of treatment plan with patient or surrogate, discussions with consultants, evaluation of patient's response to treatment,  examination of patient, ordering and review of laboratory studies, ordering and review of radiographic studies, ordering and performing treatments and interventions, pulse oximetry, re-evaluation of patient's condition and review of old charts   Care discussed with: admitting provider     (including critical care time)  Medical Decision Making / ED Course   MDM:  88 year old female presenting to the emergency department shortness of breath.  Patient is overall very well-appearing, physical examination unremarkable.  No tachycardia.   Given unexplained dyspnea, workup was obtained including CTA chest which does show numerous lobar and subsegmental pulmonary emboli as well as right sided pulmonary infarct.  Her BNP is mildly elevated.  Her troponin is reassuringly normal.  Suspect her chest pain was from pulmonary infarct.  Unclear cause of pulmonary embolism.  She really does not have any risk factors.  She is quite well-appearing considering the clot burden.  Will start on heparin .  CTA without obvious right heart strain, however this is limited due to her also large hiatal hernia, discussed with radiologist to report that the hiatal hernia would if anything minimize evidence of right heart strain.  Given her elevated BNP, age, would admit patient for echocardiogram for further restratification and observation.  Patient is agreeable for this.  Discussed with Dr. Segars who  admitted the patient for further workup and monitoring.        Additional history obtained: -Additional history obtained from family -External records from outside source obtained and reviewed including: Chart review including previous notes, labs, imaging, consultation notes including prior notes    Lab Tests: -I ordered, reviewed, and interpreted labs.   The pertinent results include:   Labs Reviewed  CBC WITH DIFFERENTIAL/PLATELET - Abnormal; Notable for the following components:      Result Value   WBC 11.4  (*)    Hemoglobin 11.8 (*)    HCT 35.5 (*)    Neutro Abs 8.8 (*)    Monocytes Absolute 1.3 (*)    All other components  within normal limits  BASIC METABOLIC PANEL - Abnormal; Notable for the following components:   Sodium 131 (*)    Glucose, Bld 168 (*)    All other components within normal limits  BRAIN NATRIURETIC PEPTIDE - Abnormal; Notable for the following components:   B Natriuretic Peptide 208.0 (*)    All other components within normal limits  HEPARIN  LEVEL (UNFRACTIONATED)  CBC  BASIC METABOLIC PANEL  MAGNESIUM  PHOSPHORUS  I-STAT CHEM 8, ED  TROPONIN I (HIGH SENSITIVITY)  TROPONIN I (HIGH SENSITIVITY)    Notable for mild elevated BNP, mild leukocytosis   EKG   EKG Interpretation Date/Time:  Saturday May 06 2023 17:16:03 EST Ventricular Rate:  84 PR Interval:  128 QRS Duration:  72 QT Interval:  362 QTC Calculation: 427 R Axis:   104  Text Interpretation: Normal sinus rhythm Rightward axis Borderline ECG Confirmed by Francesca Fallow [phone]) on 05/06/2023 7:40:26 PM         Imaging Studies ordered: I ordered imaging studies including CTA chest  On my interpretation imaging demonstrates PE  I independently visualized and interpreted imaging. I agree with the radiologist interpretation   Medicines ordered and prescription drug management: Meds ordered this encounter  Medications   iohexol  (OMNIPAQUE ) 350 MG/ML injection 75 mL   heparin  bolus via infusion 3,800 Units   heparin  ADULT infusion 100 units/mL (25000 units/250mL)   sodium chloride  flush (NS) 0.9 % injection 3 mL   acetaminophen  (TYLENOL ) tablet 1,000 mg   albuterol  (PROVENTIL ) (2.5 MG/3ML) 0.083% nebulizer solution 2.5 mg   ondansetron  (ZOFRAN ) injection 4 mg   polyethylene glycol (MIRALAX  / GLYCOLAX ) packet 17 g   melatonin tablet 6 mg   Mesalamine  (ASACOL ) DR capsule 800 mg   metoprolol  tartrate (LOPRESSOR ) tablet 25 mg   pantoprazole  (PROTONIX ) EC tablet 20 mg    -I have reviewed  the patients home medicines and have made adjustments as needed   Consultations Obtained: I requested consultation with the hospitalist,  and discussed lab and imaging findings as well as pertinent plan - they recommend: admission   Cardiac Monitoring: The patient was maintained on a cardiac monitor.  I personally viewed and interpreted the cardiac monitored which showed an underlying rhythm of: NSR  Co morbidities that complicate the patient evaluation  Past Medical History:  Diagnosis Date   Anemia    Arthritis    Cancer (HCC)    skin cancer on nose   Carpal tunnel syndrome, bilateral    Cataract    Esophagitis    GERD (gastroesophageal reflux disease)    H/O hiatal hernia    Osteopenia    Scoliosis    Shingles    SVT (supraventricular tachycardia) (HCC)    Ulcerative colitis       Dispostion: Disposition decision including need for hospitalization was considered, and patient admitted to the hospital.    Final Clinical Impression(s) / ED Diagnoses Final diagnoses:  Acute pulmonary embolism with acute cor pulmonale, unspecified pulmonary embolism type (HCC)     This chart was dictated using voice recognition software.  Despite best efforts to proofread,  errors can occur which can change the documentation meaning.    Francesca Fallow CROME, MD 05/06/23 2128

## 2023-05-06 NOTE — Progress Notes (Signed)
 PHARMACY - ANTICOAGULATION CONSULT NOTE  Pharmacy Consult for Heparin  Drip Indication: pulmonary embolus  Allergies  Allergen Reactions   Penicillins Itching, Swelling and Rash   Influenza Virus Vacc Split Pf Rash    Per Patient she was told by her PCP not to ever take this injection again   Influenza Virus Vaccine Rash    Per Patient she was told by her PCP not to ever take this injection again Covid vaccine as well   Tape Itching    Patient Measurements: Height: 5' (152.4 cm) Weight: 54.4 kg (120 lb) IBW/kg (Calculated) : 45.5 Heparin  Dosing Weight: 54.4 kg  Vital Signs: Temp: 98.3 F (36.8 C) (01/04 1713) Temp Source: Oral (01/04 1713) BP: 127/81 (01/04 1713) Pulse Rate: 83 (01/04 1713)  Labs: Recent Labs    05/06/23 1753  HGB 11.8*  HCT 35.5*  PLT 254  CREATININE 0.72  TROPONINIHS 14    Estimated Creatinine Clearance: 36.9 mL/min (by C-G formula based on SCr of 0.72 mg/dL).   Medical History: Past Medical History:  Diagnosis Date   Anemia    Arthritis    Cancer (HCC)    skin cancer on nose   Carpal tunnel syndrome, bilateral    Cataract    Esophagitis    GERD (gastroesophageal reflux disease)    H/O hiatal hernia    Osteopenia    Scoliosis    Shingles    SVT (supraventricular tachycardia) (HCC)    Ulcerative colitis    Assessment: Patient is a 88yo female presenting with SOB. CT reveals PE. Pharmacy consulted for Heparin  dosing.   Goal of Therapy:  Heparin  level 0.3-0.7 units/ml Monitor platelets by anticoagulation protocol: Yes   Plan:  Give 3800 units bolus x 1 Start heparin  infusion at 900 units/hr Check anti-Xa level in 8 hours and daily while on heparin  Continue to monitor H&H and platelets  Olam Fritter, PharmD, BCPS 05/06/2023 8:22 PM

## 2023-05-06 NOTE — ED Provider Triage Note (Signed)
 Emergency Medicine Provider Triage Evaluation Note  Lori Soto , a 88 y.o. female  was evaluated in triage.  Pt complains of DOE.  Review of Systems  Positive:  Negative:   Physical Exam  BP 127/81 (BP Location: Left Arm)   Pulse 83   Temp 98.3 F (36.8 C) (Oral)   Resp 19   Ht 5' (1.524 m)   Wt 54.4 kg   SpO2 96%   BMI 23.44 kg/m  Gen:   Awake, no distress   Resp:  Normal effort  MSK:   Moves extremities without difficulty  Other:    Medical Decision Making  Medically screening exam initiated at 5:39 PM.  Appropriate orders placed.  Lori Soto was informed that the remainder of the evaluation will be completed by another provider, this initial triage assessment does not replace that evaluation, and the importance of remaining in the ED until their evaluation is complete.  Concerned with DOE that started today. Patient also endorses hx of SVT and states that this did NOT happen today. Denies fever, chest pain, nausea, vomiting, diarrhea.  Denies hx COPD, asthma, CHF.  Denies recent surgery/immobilization, hx DVT/PE, hemoptysis, hx cancer in the past 6 months, calf swelling/tenderness.    Lori Soto, [place] 05/06/23 1742

## 2023-05-07 ENCOUNTER — Inpatient Hospital Stay (HOSPITAL_COMMUNITY): Payer: Medicare Other

## 2023-05-07 DIAGNOSIS — E871 Hypo-osmolality and hyponatremia: Secondary | ICD-10-CM

## 2023-05-07 DIAGNOSIS — I2699 Other pulmonary embolism without acute cor pulmonale: Secondary | ICD-10-CM | POA: Diagnosis not present

## 2023-05-07 DIAGNOSIS — I471 Supraventricular tachycardia, unspecified: Secondary | ICD-10-CM | POA: Diagnosis not present

## 2023-05-07 DIAGNOSIS — I10 Essential (primary) hypertension: Secondary | ICD-10-CM

## 2023-05-07 LAB — BASIC METABOLIC PANEL
Anion gap: 10 (ref 5–15)
BUN: 9 mg/dL (ref 8–23)
CO2: 25 mmol/L (ref 22–32)
Calcium: 8.9 mg/dL (ref 8.9–10.3)
Chloride: 103 mmol/L (ref 98–111)
Creatinine, Ser: 0.64 mg/dL (ref 0.44–1.00)
GFR, Estimated: >60 mL/min (ref >60)
Glucose, Bld: 103 mg/dL — ABNORMAL HIGH (ref 70–99)
Potassium: 3.8 mmol/L (ref 3.5–5.1)
Sodium: 138 mmol/L (ref 135–145)

## 2023-05-07 LAB — TROPONIN I (HIGH SENSITIVITY): Troponin I (High Sensitivity): 12 ng/L (ref <18)

## 2023-05-07 LAB — CBC
HCT: 33.9 % — ABNORMAL LOW (ref 36.0–46.0)
Hemoglobin: 11.5 g/dL — ABNORMAL LOW (ref 12.0–15.0)
MCH: 29.9 pg (ref 26.0–34.0)
MCHC: 33.9 g/dL (ref 30.0–36.0)
MCV: 88.1 fL (ref 80.0–100.0)
Platelets: 236 10*3/uL (ref 150–400)
RBC: 3.85 MIL/uL — ABNORMAL LOW (ref 3.87–5.11)
RDW: 13.6 % (ref 11.5–15.5)
WBC: 9.5 10*3/uL (ref 4.0–10.5)
nRBC: 0 % (ref 0.0–0.2)

## 2023-05-07 LAB — MAGNESIUM: Magnesium: 1.7 mg/dL (ref 1.7–2.4)

## 2023-05-07 LAB — HEPARIN LEVEL (UNFRACTIONATED)
Heparin Unfractionated: 0.21 [IU]/mL — ABNORMAL LOW (ref 0.30–0.70)
Heparin Unfractionated: 0.35 [IU]/mL (ref 0.30–0.70)
Heparin Unfractionated: 0.3 [IU]/mL (ref 0.30–0.70)

## 2023-05-07 LAB — PHOSPHORUS: Phosphorus: 4 mg/dL (ref 2.5–4.6)

## 2023-05-07 MED ORDER — HEPARIN BOLUS VIA INFUSION
1000.0000 [IU] | Freq: Once | INTRAVENOUS | Status: AC
  Administered 2023-05-07: 1000 [IU] via INTRAVENOUS
  Filled 2023-05-07: qty 1000

## 2023-05-07 MED ORDER — PANTOPRAZOLE SODIUM 40 MG PO TBEC
40.0000 mg | DELAYED_RELEASE_TABLET | Freq: Every day | ORAL | Status: DC
  Administered 2023-05-07 – 2023-05-09 (×3): 40 mg via ORAL
  Filled 2023-05-07 (×3): qty 1

## 2023-05-07 MED ORDER — MESALAMINE 400 MG PO CPDR
400.0000 mg | DELAYED_RELEASE_CAPSULE | Freq: Every day | ORAL | Status: DC
  Administered 2023-05-07 – 2023-05-09 (×3): 400 mg via ORAL
  Filled 2023-05-07 (×4): qty 1

## 2023-05-07 MED ORDER — KETOROLAC TROMETHAMINE 15 MG/ML IJ SOLN
15.0000 mg | Freq: Once | INTRAMUSCULAR | Status: AC
  Administered 2023-05-07: 15 mg via INTRAVENOUS
  Filled 2023-05-07: qty 1

## 2023-05-07 NOTE — Progress Notes (Signed)
 [facility]                                                                               Lori Soto, is a 88 y.o. female, DOB - February 28, 1936, FMW:[id number] Admit date - 05/06/2023    Outpatient Primary MD for the patient is Jolinda Norene HERO, DO  LOS - 1  days    Brief summary    88 y.o. female with hx of SVT, hypertension, ulcerative colitis, GERD/esophagitis, who presented due to acute onset of dyspnea with minimal exertion.  She reports having a h/o varicose veins and reports swelling of her ankles intermittently. No provoking factors other than UC.    Assessment & Plan    Assessment and Plan:  Borderline submassive pulmonary embolism:  Currently on IV heparin . Checking echocardiogram for evaluation of right heart strain.  Provoking factors include UC.  Check lower extremity duplex for evaluation of DVT.  Therapy eval once heparin  reaches therapeutic level.   Anemia of chronic disease Hemoglobin stable around 11.    Hyponatremia Resolved.   Mild leukocytosis Resolved.   Hypertension Well controlled BP parameters   GERD Stable.    H/o UC Resume mesalamine .    SVT: Resume metoprolol .     Estimated body mass index is 23.44 kg/m as calculated from the following:   Height as of this encounter: 5' (1.524 m).   Weight as of this encounter: 54.4 kg.  Code Status: full code.  DVT Prophylaxis:  IV Heparin    Level of Care: Level of care: Telemetry Family Communication: Updated patient's family at bedside.   Disposition Plan:     Remains inpatient appropriate:  IV heparin .   Procedures:  Echocardiogram.   Consultants:   None.   Antimicrobials:   Anti-infectives (From admission, onward)    None        Medications  Scheduled Meds:  Mesalamine   400 mg Oral Daily   metoprolol  tartrate  25 mg Oral BID   pantoprazole   40 mg Oral QAC breakfast   sodium chloride  flush  3 mL Intravenous Q12H   Continuous Infusions:   heparin  1,050 Units/hr (05/07/23 1317)   PRN Meds:.acetaminophen , albuterol , melatonin, ondansetron  (ZOFRAN ) IV, polyethylene glycol    Subjective:   Lori Soto was seen and examined today.  No chest pain.   Objective:   Vitals:   05/07/23 0151 05/07/23 0445 05/07/23 0541 05/07/23 1638  BP: 135/82 (!) 142/88 131/75 (!) 146/87  Pulse: 78 79 74 83  Resp: (!) 21  (!) 24 20  Temp: 98.5 F (36.9 C) 99 F (37.2 C) 98.2 F (36.8 C) 99.2 F (37.3 C)  TempSrc: Oral Oral Oral Oral  SpO2: 97% 94% 94% 93%  Weight:      Height:        Intake/Output Summary (Last 24 hours) at 05/07/2023 1800 Last data filed at 05/07/2023 1752 Gross per 24 hour  Intake 963 ml  Output --  Net 963 ml   Filed Weights   05/06/23 1712  Weight: 54.4 kg     Exam General: Alert and oriented x 3, NAD Cardiovascular: S1 S2 auscultated, no murmurs, RRR Respiratory: Clear to auscultation bilaterally, no wheezing,  rales or rhonchi Gastrointestinal: Soft, nontender, nondistended, + bowel sounds Ext: no pedal edema bilaterally Neuro: AAOx3, Cr N's II- XII. Strength 5/5 upper and lower extremities bilaterally Skin: No rashes Psych: Normal affect and demeanor, alert and oriented x3    Data Reviewed:  I have personally reviewed following labs and imaging studies   CBC Lab Results  Component Value Date   WBC 9.5 05/07/2023   RBC 3.85 (L) 05/07/2023   HGB 11.5 (L) 05/07/2023   HCT 33.9 (L) 05/07/2023   MCV 88.1 05/07/2023   MCH 29.9 05/07/2023   PLT 236 05/07/2023   MCHC 33.9 05/07/2023   RDW 13.6 05/07/2023   LYMPHSABS 1.3 05/06/2023   MONOABS 1.3 (H) 05/06/2023   EOSABS 0.0 05/06/2023   BASOSABS 0.0 05/06/2023     Last metabolic panel Lab Results  Component Value Date   NA 138 05/07/2023   K 3.8 05/07/2023   CL 103 05/07/2023   CO2 25 05/07/2023   BUN 9 05/07/2023   CREATININE 0.64 05/07/2023   GLUCOSE 103 (H) 05/07/2023   GFRNONAA >60 05/07/2023   GFRAA 74 04/29/2020   CALCIUM  8.9  05/07/2023   PHOS 4.0 05/07/2023   PROT 6.8 04/01/2023   ALBUMIN 3.8 04/01/2023   LABGLOB 2.5 11/11/2022   AGRATIO 1.7 05/31/2022   BILITOT 0.8 04/01/2023   ALKPHOS 34 (L) 04/01/2023   AST 35 04/01/2023   ALT 25 04/01/2023   ANIONGAP 10 05/07/2023    CBG (last 3)  No results for input(s): GLUCAP in the last 72 hours.    Coagulation Profile: No results for input(s): INR, PROTIME in the last 168 hours.   Radiology Studies: US  Venous Img Lower Bilateral (DVT) Result Date: 05/07/2023 CLINICAL DATA:  Pulmonary embolism. Evaluate for deep vein thrombosis. EXAM: BILATERAL LOWER EXTREMITY VENOUS DOPPLER ULTRASOUND TECHNIQUE: Gray-scale sonography with graded compression, as well as color Doppler and duplex ultrasound were performed to evaluate the lower extremity deep venous systems from the level of the common femoral vein and including the common femoral, femoral, profunda femoral, popliteal and calf veins including the posterior tibial, peroneal and gastrocnemius veins when visible. The superficial great saphenous vein was also interrogated. Spectral Doppler was utilized to evaluate flow at rest and with distal augmentation maneuvers in the common femoral, femoral and popliteal veins. COMPARISON:  None Available. FINDINGS: RIGHT LOWER EXTREMITY Common Femoral Vein: No evidence of thrombus. Normal compressibility, respiratory phasicity and response to augmentation. Saphenofemoral Junction: No evidence of thrombus. Normal compressibility and flow on color Doppler imaging. Profunda Femoral Vein: No evidence of thrombus. Normal compressibility and flow on color Doppler imaging. Femoral Vein: No evidence of thrombus. Normal compressibility, respiratory phasicity and response to augmentation. Popliteal Vein: No evidence of thrombus. Normal compressibility, respiratory phasicity and response to augmentation. Calf Veins: No evidence of thrombus. Normal compressibility and flow on color Doppler  imaging. Superficial Great Saphenous Vein: No evidence of thrombus. Normal compressibility. Venous Reflux:  None. Other Findings:  None. LEFT LOWER EXTREMITY Common Femoral Vein: No evidence of thrombus. Normal compressibility, respiratory phasicity and response to augmentation. Saphenofemoral Junction: No evidence of thrombus. Normal compressibility and flow on color Doppler imaging. Profunda Femoral Vein: No evidence of thrombus. Normal compressibility and flow on color Doppler imaging. Femoral Vein: No evidence of thrombus. Normal compressibility, respiratory phasicity and response to augmentation. Popliteal Vein: No evidence of thrombus. Normal compressibility, respiratory phasicity and response to augmentation. Calf Veins: No evidence of thrombus. Normal compressibility and flow on color Doppler imaging. Superficial Great  Saphenous Vein: No evidence of thrombus. Normal compressibility. Venous Reflux:  None. Other Findings:  None. IMPRESSION: No evidence of deep venous thrombosis in either lower extremity. Electronically Signed   By: Waddell Calk M.D.   On: 05/07/2023 17:25   CT Angio Chest PE W/Cm &/Or Wo Cm Result Date: 05/06/2023 CLINICAL DATA:  PE suspected, palpitations EXAM: CT ANGIOGRAPHY CHEST WITH CONTRAST TECHNIQUE: Multidetector CT imaging of the chest was performed using the standard protocol during bolus administration of intravenous contrast. Multiplanar CT image reconstructions and MIPs were obtained to evaluate the vascular anatomy. RADIATION DOSE REDUCTION: This exam was performed according to the departmental dose-optimization program which includes automated exposure control, adjustment of the mA and/or kV according to patient size and/or use of iterative reconstruction technique. CONTRAST:  75mL OMNIPAQUE  IOHEXOL  350 MG/ML SOLN COMPARISON:  None Available. FINDINGS: Cardiovascular: Satisfactory opacification of the pulmonary arteries to the segmental level. Positive examination for  pulmonary embolism with lobar to segmental embolus present bilaterally. Normal heart size. RV LV ratio 1.0, however of uncertain reliability given very substantial mass effect of large hiatal hernia. Left coronary artery calcifications no pericardial effusion. Mediastinum/Nodes: No enlarged mediastinal, hilar, or axillary lymph nodes. Unusually large hiatal hernia containing the stomach and a great deal of bowel. Thyroid  gland, trachea, and esophagus demonstrate no significant findings. Lungs/Pleura: Small right pleural effusion. Ground-glass airspace opacity in the dependent right lower lobe which is distal to a large burden of embolus in the right lower lobe pulmonary arteries. Compressive atelectasis of the lungs secondary to large hiatal hernia. Upper Abdomen: No acute abnormality. Musculoskeletal: No chest wall abnormality. No acute osseous findings. Review of the MIP images confirms the above findings. IMPRESSION: 1. Positive examination for pulmonary embolism with lobar to segmental embolus present bilaterally. 2. Ground-glass airspace opacity in the dependent right lower lobe which is distal to a large burden of embolus in the right lower lobe pulmonary arteries. This is concerning for pulmonary infarct. 3. Small right pleural effusion, likely reactive. 4. RV LV ratio 1.0, however of uncertain reliability given very substantial mass effect of large hiatal hernia. 5. Unusually large hiatal hernia containing the stomach and a large portion of the small bowel and colon. 6. Coronary artery disease. Call report request was placed at the time of interpretation. Report issued at this time in the interest of expediency. Final communication of critical findings will be documented. Electronically Signed   By: Marolyn JONETTA Jaksch M.D.   On: 05/06/2023 19:18   DG Chest 2 View Result Date: 05/06/2023 CLINICAL DATA:  Shortness of breath EXAM: CHEST - 2 VIEW COMPARISON:  11/23/2022 FINDINGS: Large hiatal hernia is again  identified. Cardiac shadow is enlarged but stable. Lungs are well aerated bilaterally. No focal infiltrate or effusion is noted IMPRESSION: Large hiatal hernia.  No other focal abnormality is noted. Electronically Signed   By: Oneil Devonshire M.D.   On: 05/06/2023 17:45       Elgie Butter M.D. [facility] 05/07/2023, 6:00 PM  Available via Epic secure chat 7am-7pm After 7 pm, please refer to night coverage provider listed on amion.

## 2023-05-07 NOTE — Progress Notes (Signed)
   05/07/23 1304  TOC Brief Assessment  Insurance and Status Reviewed  Patient has primary care physician Yes  Home environment has been reviewed from home  Prior level of function: independent  Prior/Current Home Services No current home services  Social Drivers of Health Review SDOH reviewed no interventions necessary  Readmission risk has been reviewed Yes  Transition of care needs no transition of care needs at this time    Transition of Care Department [facility]) has reviewed patient and no TOC needs have been identified at this time. We will continue to monitor patient advancement through interdisciplinary progression rounds. If new patient transition needs arise, please place a TOC consult.

## 2023-05-07 NOTE — Progress Notes (Signed)
 PHARMACY - ANTICOAGULATION CONSULT NOTE  Pharmacy Consult for heparin  Indication: pulmonary embolus  Labs: Recent Labs    05/06/23 1753 05/06/23 1943 05/07/23 0408  HGB 11.8*  --   --   HCT 35.5*  --   --   PLT 254  --   --   HEPARINUNFRC  --   --  0.21*  CREATININE 0.72  --   --   TROPONINIHS 14 14  --    Assessment: 87yo female subtherapeutic on heparin  with initial dosing for PE; no infusion issues or signs of bleeding per RN.  Goal of Therapy:  Heparin  level 0.3-0.7 units/ml   Plan:  1000 units heparin  bolus. Increase heparin  infusion by 2-3 units/kg/hr to 1050 units/hr. Check level in 8 hours.   Lori Soto, PharmD, BCPS 05/07/2023 4:40 AM

## 2023-05-07 NOTE — Progress Notes (Signed)
 Patient stated that she was having left sided chest pain that she reported as a squeezing sensation.  Patient rated her pain at a 5.  Patient denies shortness of breath and denies nausea.  Dr Lazarus Salines noified. No new orders received.

## 2023-05-07 NOTE — Progress Notes (Signed)
 PHARMACY - ANTICOAGULATION CONSULT NOTE  Pharmacy Consult for Heparin  Drip Indication: pulmonary embolus  Allergies  Allergen Reactions   Penicillins Itching, Swelling and Rash   Covid-19 (Mrna) Vaccine Rash    Told by PCP to never take again.   Influenza Virus Vaccine Rash    Told by PCP to never take again   Tape Itching    Patient Measurements: Height: 5' (152.4 cm) Weight: 54.4 kg (120 lb) IBW/kg (Calculated) : 45.5 Heparin  Dosing Weight: 54.4 kg  Vital Signs: Temp: 98.6 F (37 C) (01/05 2118) Temp Source: Oral (01/05 2118) BP: 160/87 (01/05 2118) Pulse Rate: 92 (01/05 2118)  Labs: Recent Labs    05/06/23 1753 05/06/23 1943 05/07/23 0408 05/07/23 0758 05/07/23 1258 05/07/23 2204  HGB 11.8*  --  11.5*  --   --   --   HCT 35.5*  --  33.9*  --   --   --   PLT 254  --  236  --   --   --   HEPARINUNFRC  --   --  0.21*  --  0.35 0.30  CREATININE 0.72  --  0.64  --   --   --   TROPONINIHS 14 14  --  12  --   --     Estimated Creatinine Clearance: 36.9 mL/min (by C-G formula based on SCr of 0.64 mg/dL).   Assessment: Patient is a 88yo female presenting with SOB. CT reveals PE. Pharmacy consulted for Heparin  dosing.   HL 0.30, therapeutic on 1050 units/hr. No issues with infusion or signs/symptoms of bleeding noted. Given on lower end of range, will increase to keep at goal.  Goal of Therapy:  Heparin  level 0.3-0.7 units/ml Monitor platelets by anticoagulation protocol: Yes   Plan:  Increase heparin  infusion to 1150 units/hr Check anti-Xa level daily while on heparin  Continue to monitor H&H and platelets  Lynwood Poplar, PharmD. Clinical Pharmacist 05/07/2023 10:36 PM

## 2023-05-07 NOTE — Progress Notes (Signed)
 Patient verbalized relief of chest pain to left side post toradol .

## 2023-05-07 NOTE — Plan of Care (Signed)
   Problem: Education: Goal: Knowledge of General Education information will improve Description Including pain rating scale, medication(s)/side effects and non-pharmacologic comfort measures Outcome: Progressing   Problem: Health Behavior/Discharge Planning: Goal: Ability to manage health-related needs will improve Outcome: Progressing

## 2023-05-07 NOTE — ED Notes (Signed)
 ED TO INPATIENT HANDOFF REPORT  ED Nurse Name and Phone #: therisa RAMAN Name/Age/Gender Lori Soto 88 y.o. female Room/Bed: APA05/APA05  Code Status   Code Status: Full Code  Home/SNF/Other Home Patient oriented to: self, place, time, and situation Is this baseline? Yes   Triage Complete: Triage complete  Chief Complaint Pulmonary embolism (HCC) [I26.99]  Triage Note Pt with SOB today, pt states she has been having palpitations. Denies any cough. Denies any chills or fevers.    Allergies Allergies  Allergen Reactions   Penicillins Itching, Swelling and Rash   Influenza Virus Vacc Split Pf Rash    Per Patient she was told by her PCP not to ever take this injection again   Influenza Virus Vaccine Rash    Per Patient she was told by her PCP not to ever take this injection again Covid vaccine as well   Tape Itching    Level of Care/Admitting Diagnosis ED Disposition     ED Disposition  Admit   Condition  --   Comment  Hospital Area: [facility] [[id number]]  Level of Care: Telemetry [5]  Covid Evaluation: Asymptomatic - no recent exposure (last 10 days) testing not required  Diagnosis: Pulmonary embolism [facility]) [[id number]]  Admitting Physician: SEGARS, JONATHAN [[id number]]  Attending Physician: SEGARS, JONATHAN [[id number]]  Certification:: I certify this patient will need inpatient services for at least 2 midnights  Expected Medical Readiness: 05/08/2023          B Medical/Surgery History Past Medical History:  Diagnosis Date   Anemia    Arthritis    Cancer (HCC)    skin cancer on nose   Carpal tunnel syndrome, bilateral    Cataract    Esophagitis    GERD (gastroesophageal reflux disease)    H/O hiatal hernia    Osteopenia    Scoliosis    Shingles    SVT (supraventricular tachycardia) (HCC)    Ulcerative colitis    Past Surgical History:  Procedure Laterality Date   ABDOMINAL HYSTERECTOMY     BUNIONECTOMY     Bilateral   CARPAL TUNNEL  RELEASE Left 03/10/2015   Procedure: LEFT CARPAL TUNNEL RELEASE;  Surgeon: Arley Curia, MD;  Location: [facility];  Service: Orthopedics;  Laterality: Left;   CARPAL TUNNEL RELEASE Right 10/31/2017   Procedure: RIGHT CARPAL TUNNEL RELEASE;  Surgeon: Curia Arley, MD;  Location:  SURGERY CENTER;  Service: Orthopedics;  Laterality: Right;   CATARACT EXTRACTION W/PHACO  05/28/2012   Procedure: CATARACT EXTRACTION PHACO AND INTRAOCULAR LENS PLACEMENT (IOC);  Surgeon: Cherene Mania, MD;  Location: [facility];  Service: Ophthalmology;  Laterality: Right;  CDE=12.84   CATARACT EXTRACTION W/PHACO  06/07/2012   Procedure: CATARACT EXTRACTION PHACO AND INTRAOCULAR LENS PLACEMENT (IOC);  Surgeon: Cherene Mania, MD;  Location: [facility];  Service: Ophthalmology;  Laterality: Left;  CDE 17.60   COLONOSCOPY     COLONOSCOPY N/A 07/08/2015   Procedure: COLONOSCOPY;  Surgeon: Claudis RAYMOND Rivet, MD;  [facility];  Service: Endoscopy;  Laterality: N/A;  930   ESOPHAGEAL DILATION N/A 07/08/2015   Procedure: ESOPHAGEAL DILATION;  Surgeon: Claudis RAYMOND Rivet, MD;  [facility];  Service: Endoscopy;  Laterality: N/A;   ESOPHAGOGASTRODUODENOSCOPY N/A 07/08/2015   Procedure: ESOPHAGOGASTRODUODENOSCOPY (EGD);  Surgeon: Claudis RAYMOND Rivet, MD;  [facility];  Service: Endoscopy;  Laterality: N/A;   Hernia     Right inguinal   HERNIA REPAIR  1961   right inguinal hernia  UPPER GASTROINTESTINAL ENDOSCOPY       A IV Location/Drains/Wounds Patient Lines/Drains/Airways Status     Active Line/Drains/Airways     Name Placement date Placement time Site Days   Peripheral IV 05/06/23 20 G Left Antecubital 05/06/23  1854  Antecubital  1            Intake/Output Last 24 hours  Intake/Output Summary (Last 24 hours) at 05/07/2023 0108 Last data filed at 05/06/2023 2230 Gross per 24 hour  Intake 3 ml  Output --  Net 3 ml    Labs/Imaging Results for orders placed or performed during the  hospital encounter of 05/06/23 (from the past 48 hours)  CBC with Differential     Status: Abnormal   Collection Time: 05/06/23  5:53 PM  Result Value Ref Range   WBC 11.4 (H) 4.0 - 10.5 K/uL   RBC 4.06 3.87 - 5.11 MIL/uL   Hemoglobin 11.8 (L) 12.0 - 15.0 g/dL   HCT 64.4 (L) 63.9 - 53.9 %   MCV 87.4 80.0 - 100.0 fL   MCH 29.1 26.0 - 34.0 pg   MCHC 33.2 30.0 - 36.0 g/dL   RDW 86.5 88.4 - 84.4 %   Platelets 254 150 - 400 K/uL   nRBC 0.0 0.0 - 0.2 %   Neutrophils Relative % 78 %   Neutro Abs 8.8 (H) 1.7 - 7.7 K/uL   Lymphocytes Relative 11 %   Lymphs Abs 1.3 0.7 - 4.0 K/uL   Monocytes Relative 11 %   Monocytes Absolute 1.3 (H) 0.1 - 1.0 K/uL   Eosinophils Relative 0 %   Eosinophils Absolute 0.0 0.0 - 0.5 K/uL   Basophils Relative 0 %   Basophils Absolute 0.0 0.0 - 0.1 K/uL   Immature Granulocytes 0 %   Abs Immature Granulocytes 0.04 0.00 - 0.07 K/uL    Comment: Performed at [facility], [address] [place], [place] [address]  Basic metabolic panel     Status: Abnormal   Collection Time: 05/06/23  5:53 PM  Result Value Ref Range   Sodium 131 (L) 135 - 145 mmol/L   Potassium 3.6 3.5 - 5.1 mmol/L   Chloride 99 98 - 111 mmol/L   CO2 23 22 - 32 mmol/L   Glucose, Bld 168 (H) 70 - 99 mg/dL    Comment: Glucose reference range applies only to samples taken after fasting for at least 8 hours.   BUN 12 8 - 23 mg/dL   Creatinine, Ser 9.27 0.44 - 1.00 mg/dL   Calcium  8.9 8.9 - 10.3 mg/dL   GFR, Estimated >39 >39 mL/min    Comment: (NOTE) Calculated using the CKD-EPI Creatinine Equation (2021)    Anion gap 9 5 - 15    Comment: Performed at [facility], [address], [place], [place] [address]  Troponin I (High Sensitivity)     Status: None   Collection Time: 05/06/23  5:53 PM  Result Value Ref Range   Troponin I (High Sensitivity) 14 <18 ng/L    Comment: (NOTE) Elevated high sensitivity troponin I (hsTnI) values and significant  changes across serial measurements may suggest  ACS but many other  chronic and acute conditions are known to elevate hsTnI results.  Refer to the Links section for chest pain algorithms and additional  guidance. Performed at [facility], [address], [place], [place] [address]   Brain natriuretic peptide     Status: Abnormal   Collection Time: 05/06/23  5:53 PM  Result Value  Ref Range   B Natriuretic Peptide 208.0 (H) 0.0 - 100.0 pg/mL    Comment: Performed at [facility], [address], [place], [place] [address]  Troponin I (High Sensitivity)     Status: None   Collection Time: 05/06/23  7:43 PM  Result Value Ref Range   Troponin I (High Sensitivity) 14 <18 ng/L    Comment: (NOTE) Elevated high sensitivity troponin I (hsTnI) values and significant  changes across serial measurements may suggest ACS but many other  chronic and acute conditions are known to elevate hsTnI results.  Refer to the Links section for chest pain algorithms and additional  guidance. Performed at [facility], [address] [place], [place] [address]    CT Angio Chest PE W/Cm &/Or Wo Cm Result Date: 05/06/2023 CLINICAL DATA:  PE suspected, palpitations EXAM: CT ANGIOGRAPHY CHEST WITH CONTRAST TECHNIQUE: Multidetector CT imaging of the chest was performed using the standard protocol during bolus administration of intravenous contrast. Multiplanar CT image reconstructions and MIPs were obtained to evaluate the vascular anatomy. RADIATION DOSE REDUCTION: This exam was performed according to the departmental dose-optimization program which includes automated exposure control, adjustment of the mA and/or kV according to patient size and/or use of iterative reconstruction technique. CONTRAST:  75mL OMNIPAQUE  IOHEXOL  350 MG/ML SOLN COMPARISON:  None Available. FINDINGS: Cardiovascular: Satisfactory opacification of the pulmonary arteries to the segmental level. Positive examination for pulmonary embolism with lobar to segmental embolus present bilaterally.  Normal heart size. RV LV ratio 1.0, however of uncertain reliability given very substantial mass effect of large hiatal hernia. Left coronary artery calcifications no pericardial effusion. Mediastinum/Nodes: No enlarged mediastinal, hilar, or axillary lymph nodes. Unusually large hiatal hernia containing the stomach and a great deal of bowel. Thyroid  gland, trachea, and esophagus demonstrate no significant findings. Lungs/Pleura: Small right pleural effusion. Ground-glass airspace opacity in the dependent right lower lobe which is distal to a large burden of embolus in the right lower lobe pulmonary arteries. Compressive atelectasis of the lungs secondary to large hiatal hernia. Upper Abdomen: No acute abnormality. Musculoskeletal: No chest wall abnormality. No acute osseous findings. Review of the MIP images confirms the above findings. IMPRESSION: 1. Positive examination for pulmonary embolism with lobar to segmental embolus present bilaterally. 2. Ground-glass airspace opacity in the dependent right lower lobe which is distal to a large burden of embolus in the right lower lobe pulmonary arteries. This is concerning for pulmonary infarct. 3. Small right pleural effusion, likely reactive. 4. RV LV ratio 1.0, however of uncertain reliability given very substantial mass effect of large hiatal hernia. 5. Unusually large hiatal hernia containing the stomach and a large portion of the small bowel and colon. 6. Coronary artery disease. Call report request was placed at the time of interpretation. Report issued at this time in the interest of expediency. Final communication of critical findings will be documented. Electronically Signed   By: Marolyn JONETTA Jaksch M.D.   On: 05/06/2023 19:18   DG Chest 2 View Result Date: 05/06/2023 CLINICAL DATA:  Shortness of breath EXAM: CHEST - 2 VIEW COMPARISON:  11/23/2022 FINDINGS: Large hiatal hernia is again identified. Cardiac shadow is enlarged but stable. Lungs are well aerated  bilaterally. No focal infiltrate or effusion is noted IMPRESSION: Large hiatal hernia.  No other focal abnormality is noted. Electronically Signed   By: Oneil Devonshire M.D.   On: 05/06/2023 17:45    Pending Labs [employer] (From admission, onward)     Start  Ordered   05/07/23 0500  CBC  Tomorrow morning,   R        05/06/23 2020   05/07/23 0500  Basic metabolic panel  Tomorrow morning,   R        05/06/23 2112   05/07/23 0500  Magnesium  Tomorrow morning,   R        05/06/23 2112   05/07/23 0500  Phosphorus  Tomorrow morning,   R        05/06/23 2112   05/07/23 0400  Heparin  level (unfractionated)  Once-Timed,   URGENT        05/06/23 2020            Vitals/Pain Today's Vitals   05/06/23 2235 05/06/23 2300 05/06/23 2315 05/06/23 2330  BP: (!) 143/85 (!) 143/83 (!) 156/97 (!) 152/87  Pulse: 81 78 78 79  Resp:  (!) 21  20  Temp:      TempSrc:      SpO2:  99% 100% 97%  Weight:      Height:        Isolation Precautions No active isolations  Medications Medications  heparin  ADULT infusion 100 units/mL (25000 units/250mL) (900 Units/hr Intravenous New Bag/Given 05/06/23 2010)  sodium chloride  flush (NS) 0.9 % injection 3 mL (3 mLs Intravenous Given 05/06/23 2230)  acetaminophen  (TYLENOL ) tablet 1,000 mg (has no administration in time range)  albuterol  (PROVENTIL ) (2.5 MG/3ML) 0.083% nebulizer solution 2.5 mg (has no administration in time range)  ondansetron  (ZOFRAN ) injection 4 mg (has no administration in time range)  polyethylene glycol (MIRALAX  / GLYCOLAX ) packet 17 g (has no administration in time range)  melatonin tablet 6 mg (has no administration in time range)  metoprolol  tartrate (LOPRESSOR ) tablet 25 mg (25 mg Oral Given 05/06/23 2235)  pantoprazole  (PROTONIX ) EC tablet 20 mg (has no administration in time range)  Mesalamine  (ASACOL ) DR capsule 400 mg (has no administration in time range)  iohexol  (OMNIPAQUE ) 350 MG/ML injection 75 mL (75 mLs Intravenous  Contrast Given 05/06/23 1854)  heparin  bolus via infusion 3,800 Units (3,800 Units Intravenous Bolus from Bag 05/06/23 2011)    Mobility walks     Focused Assessments Cardiac Assessment Handoff:    Lab Results  Component Value Date   TROPONINI 0.10 (HH) 08/26/2018   No results found for: DDIMER Does the Patient currently have chest pain? No    R Recommendations: See Admitting Provider Note  Report given to:   Additional Notes:

## 2023-05-07 NOTE — H&P (Addendum)
 History and Physical    Lori Soto FMW:[id number] DOB: 1935/06/19 DOA: 05/06/2023  PCP: Jolinda Norene HERO, DO   Patient coming from: Home   Chief Complaint:  Chief Complaint  Patient presents with   Shortness of Breath    HPI:  Lori Soto is a 88 y.o. female with hx of SVT, hypertension, ulcerative colitis, GERD/esophagitis, who presented due to acute onset of dyspnea with minimal exertion.  Reports that over the past week or 2 that she is felt fatigued.  However had no significant dyspnea until today. Now unable to go ~ 15 feet without having to stop.  Also had pain along her right flank which was new today.  No hx of VTE in the past, no underlying lung disease.  No recent sedentary behavior, plane/car ride.  Once she stands on her feet most of the day she gets swelling in both ankles also has a history of varicose veins.  However did not notice unilateral leg swelling discomfort or skin changes prior.  No other recent illness.  Review of Systems:  ROS complete and negative except as marked above   Allergies  Allergen Reactions   Penicillins Itching, Swelling and Rash   Influenza Virus Vacc Split Pf Rash    Per Patient she was told by her PCP not to ever take this injection again   Influenza Virus Vaccine Rash    Per Patient she was told by her PCP not to ever take this injection again Covid vaccine as well   Tape Itching    Prior to Admission medications   Medication Sig Start Date End Date Taking? Authorizing Provider  Coenzyme Q10 (CO Q-10 PO) Take 1 tablet by mouth daily. With red yeast rice [employer]    [provider]  Cyanocobalamin  (VITAMIN B 12 PO) Take 1,000 mcg by mouth daily.    [provider]  Ginkgo Biloba 40 MG TABS Take 40 mg by mouth daily.    [provider]  glucosamine-chondroitin 500-400 MG tablet Take 1 tablet by mouth 2 (two) times daily.    [provider]  Hyaluronic Acid-Vitamin C  (HYALURONIC ACID PO) Take 80 mg by mouth daily.    [provider]  Mesalamine  (ASACOL ) 400 MG CPDR DR capsule TAKE 2 CAPSULES BY MOUTH DAILY Patient taking differently: Take 400 mg by mouth daily. 02/01/23   Eartha Angelia Sieving, MD  metoprolol  tartrate (LOPRESSOR ) 25 MG tablet Take 1 tablet (25 mg total) by mouth 2 (two) times daily. 03/08/23   Lavona Agent, MD  MULTIPLE VITAMINS PO Take 1 tablet by mouth daily. In the mornings    [provider]  Nutritional Supplements (GRAPESEED EXTRACT PO) Take 60 mg by mouth daily.     [provider]  OVER THE COUNTER MEDICATION Take 66 mg by mouth daily. Vision Gold Lutein    [provider]  OVER THE COUNTER MEDICATION Take 250 mg by mouth daily. Alphalipoic Acid 250 mg daily    [provider]  pantoprazole  (PROTONIX ) 20 MG tablet Take 1 tablet (20 mg total) by mouth daily before breakfast. 12/19/22   Eartha Angelia, Sieving, MD  pyridOXINE (VITAMIN B-6) 100 MG tablet Take 100 mg by mouth daily.    [provider]  Turmeric 500 MG CAPS Take 1 capsule by mouth in the morning and at bedtime.    [provider]  Zinc 50 MG CAPS Take 50 mg by mouth daily.    [provider]  Past Medical History:  Diagnosis Date   Anemia    Arthritis    Cancer (HCC)    skin cancer on nose   Carpal tunnel syndrome, bilateral    Cataract    Esophagitis    GERD (gastroesophageal reflux disease)    H/O hiatal hernia    Osteopenia    Scoliosis    Shingles    SVT (supraventricular tachycardia) (HCC)    Ulcerative colitis     Past Surgical History:  Procedure Laterality Date   ABDOMINAL HYSTERECTOMY     BUNIONECTOMY     Bilateral   CARPAL TUNNEL RELEASE Left 03/10/2015   Procedure: LEFT CARPAL TUNNEL RELEASE;  Surgeon: Arley Curia, MD;  Location: [facility];  Service: Orthopedics;  Laterality: Left;   CARPAL TUNNEL RELEASE Right 10/31/2017   Procedure: RIGHT CARPAL  TUNNEL RELEASE;  Surgeon: Curia Arley, MD;  Location: [facility];  Service: Orthopedics;  Laterality: Right;   CATARACT EXTRACTION W/PHACO  05/28/2012   Procedure: CATARACT EXTRACTION PHACO AND INTRAOCULAR LENS PLACEMENT (IOC);  Surgeon: Cherene Mania, MD;  Location: [facility];  Service: Ophthalmology;  Laterality: Right;  CDE=12.84   CATARACT EXTRACTION W/PHACO  06/07/2012   Procedure: CATARACT EXTRACTION PHACO AND INTRAOCULAR LENS PLACEMENT (IOC);  Surgeon: Cherene Mania, MD;  Location: [facility];  Service: Ophthalmology;  Laterality: Left;  CDE 17.60   COLONOSCOPY     COLONOSCOPY N/A 07/08/2015   Procedure: COLONOSCOPY;  Surgeon: Claudis RAYMOND Rivet, MD;  Location: [facility];  Service: Endoscopy;  Laterality: N/A;  930   ESOPHAGEAL DILATION N/A 07/08/2015   Procedure: ESOPHAGEAL DILATION;  Surgeon: Claudis RAYMOND Rivet, MD;  Location: [facility];  Service: Endoscopy;  Laterality: N/A;   ESOPHAGOGASTRODUODENOSCOPY N/A 07/08/2015   Procedure: ESOPHAGOGASTRODUODENOSCOPY (EGD);  Surgeon: Claudis RAYMOND Rivet, MD;  Location: [facility];  Service: Endoscopy;  Laterality: N/A;   Hernia     Right inguinal   HERNIA REPAIR  1961   right inguinal hernia   UPPER GASTROINTESTINAL ENDOSCOPY       reports that she has never smoked. She has never used smokeless tobacco. She reports current alcohol use. She reports that she does not use drugs.  Family History  Problem Relation Age of Onset   Arthritis Mother    Heart disease Mother    Cancer Father        pancreat   Pancreatic cancer Father    Arthritis Father        back issues    Cancer Brother        lung and liver   Liver cancer Brother    Lung cancer Brother    Heart disease Sister        Rheumatic Fever   Peptic Ulcer Sister    Diabetes Sister    Colon cancer Sister    Edema Sister    GI problems Sister        diverticulitis   Diabetes Sister    Neuropathy Sister    COPD Sister    Hiatal hernia Sister    GI problems Sister       Physical Exam: Vitals:   05/07/23 0100 05/07/23 0115 05/07/23 0151 05/07/23 0541  BP: (!) 155/88 (!) 150/83 135/82 131/75  Pulse: 82 79 78 74  Resp:  18 (!) 21 (!) 24  Temp:   98.5 F (36.9 C) 98.2 F (36.8 C)  TempSrc:   Oral Oral  SpO2: 98% 99% 97% 94%  Weight:  Height:        Gen: Awake, alert, NAD CV: Regular, normal S1, S2, no murmurs  Resp: Normal WOB, diminished in the right base.  Few rare crackles. Abd: Flat, normoactive, nontender MSK: Symmetric, hyperpigmentary changes in the lower extremities, varicose veins.  Trace edema in the left lower extremity near the ankle.   Skin: See MSK for more findings, no rashes or lesions to exposed skin  Neuro: Alert and interactive  Psych: euthymic, appropriate    Data review:   Labs reviewed, notable for:   NA 131 Hyperglycemic 160s BNP 208 High-sensitivity troponin 14 x 2 WBC 11, hemoglobin 11, normocytic  Micro:  Results for orders placed or performed in visit on 05/05/20  Novel Coronavirus, NAA ([facility])     Status: Abnormal   Collection Time: 05/05/20 12:01 PM   Specimen: Nasopharyngeal(NP) swabs in vial transport medium  Result Value Ref Range Status   SARS-CoV-2, NAA Detected (A) Not Detected Final    Comment: Patients who have a positive COVID-19 test result may now have treatment options. Treatment options are available for patients with mild to moderate symptoms and for hospitalized patients. Visit our website at [email or web address] for resources and information. This nucleic acid amplification test was developed and its performance characteristics determined by [employer]. Nucleic acid amplification tests include RT-PCR and TMA. This test has not been FDA cleared or approved. This test has been authorized by FDA under an Emergency Use Authorization (EUA). This test is only authorized for the duration of time the declaration that circumstances exist justifying the  authorization of the emergency use of in vitro diagnostic tests for detection of SARS-CoV-2 virus and/or diagnosis of COVID-19 infection under section 564(b)(1) of the Act, 21 U.S.C. 639aaa-6(a) (1), unless the authorization is terminated or revoked sooner. When diagnostic testing is negativ e, the possibility of a false negative result should be considered in the context of a patient's recent exposures and the presence of clinical signs and symptoms consistent with COVID-19. An individual without symptoms of COVID-19 and who is not shedding SARS-CoV-2 virus would expect to have a negative (not detected) result in this assay.   SARS-COV-2, NAA 2 DAY TAT     Status: None   Collection Time: 05/05/20 12:01 PM  Result Value Ref Range Status   SARS-CoV-2, NAA 2 DAY TAT Performed  Final    Imaging reviewed:  CT Angio Chest PE W/Cm &/Or Wo Cm Result Date: 05/06/2023 CLINICAL DATA:  PE suspected, palpitations EXAM: CT ANGIOGRAPHY CHEST WITH CONTRAST TECHNIQUE: Multidetector CT imaging of the chest was performed using the standard protocol during bolus administration of intravenous contrast. Multiplanar CT image reconstructions and MIPs were obtained to evaluate the vascular anatomy. RADIATION DOSE REDUCTION: This exam was performed according to the departmental dose-optimization program which includes automated exposure control, adjustment of the mA and/or kV according to patient size and/or use of iterative reconstruction technique. CONTRAST:  75mL OMNIPAQUE  IOHEXOL  350 MG/ML SOLN COMPARISON:  None Available. FINDINGS: Cardiovascular: Satisfactory opacification of the pulmonary arteries to the segmental level. Positive examination for pulmonary embolism with lobar to segmental embolus present bilaterally. Normal heart size. RV LV ratio 1.0, however of uncertain reliability given very substantial mass effect of large hiatal hernia. Left coronary artery calcifications no pericardial effusion.  Mediastinum/Nodes: No enlarged mediastinal, hilar, or axillary lymph nodes. Unusually large hiatal hernia containing the stomach and a great deal of bowel. Thyroid  gland, trachea, and esophagus demonstrate no significant findings. Lungs/Pleura: Small right pleural effusion. Ground-glass airspace  opacity in the dependent right lower lobe which is distal to a large burden of embolus in the right lower lobe pulmonary arteries. Compressive atelectasis of the lungs secondary to large hiatal hernia. Upper Abdomen: No acute abnormality. Musculoskeletal: No chest wall abnormality. No acute osseous findings. Review of the MIP images confirms the above findings. IMPRESSION: 1. Positive examination for pulmonary embolism with lobar to segmental embolus present bilaterally. 2. Ground-glass airspace opacity in the dependent right lower lobe which is distal to a large burden of embolus in the right lower lobe pulmonary arteries. This is concerning for pulmonary infarct. 3. Small right pleural effusion, likely reactive. 4. RV LV ratio 1.0, however of uncertain reliability given very substantial mass effect of large hiatal hernia. 5. Unusually large hiatal hernia containing the stomach and a large portion of the small bowel and colon. 6. Coronary artery disease. Call report request was placed at the time of interpretation. Report issued at this time in the interest of expediency. Final communication of critical findings will be documented. Electronically Signed   By: Marolyn JONETTA Jaksch M.D.   On: 05/06/2023 19:18   DG Chest 2 View Result Date: 05/06/2023 CLINICAL DATA:  Shortness of breath EXAM: CHEST - 2 VIEW COMPARISON:  11/23/2022 FINDINGS: Large hiatal hernia is again identified. Cardiac shadow is enlarged but stable. Lungs are well aerated bilaterally. No focal infiltrate or effusion is noted IMPRESSION: Large hiatal hernia.  No other focal abnormality is noted. Electronically Signed   By: Oneil Devonshire M.D.   On: 05/06/2023 17:45     EKG: Sinus rhythm with right axis deviation, no acute ischemic changes  ED Course:  CTA revealing borderline submassive PE.  Started on heparin  drip   Assessment/Plan:  88 y.o. female with hx SVT, hypertension, ulcerative colitis, GERD/esophagitis, who presented due to acute onset of dyspnea with minimal exertion.  Found to have borderline submassive pulmonary embolism.  Borderline submassive pulmonary embolism 1 day of acute dyspnea on exertion, right flank pain.  Vitals normal, not hypoxic or tachycardic.  EKG right axis deviation, no right heart strain pattern.  High-sensitivity troponin within normal limit 14 x 2.  BNP slightly elevated at 208.  CTA PE with lobar to segmental PE bilaterally, area of groundglass opacity in the right lower lobe consistent with pulmonary infarct, small right pleural effusion.  RV/LV ratio 1 but limited due to mass effect from large hiatal hernia.  Would consider this unprovoked event.  Possible risk factor including inflammatory disease with ulcerative colitis. -Continue heparin  drip for now, if remains stable can transition to DOAC -TTE when available to assess for RV strain -Venous duplex bilateral lower extremities to evaluate for additional clot burden -Telemonitoring -PT eval given significant functional limitations  Mild hyponatremia Suspect hypovolemic with low solute - Trend BMP  Mild anemia, normocytic - Check iron  panel, B12  Chronic medical problems: History SVT: Continue home metoprolol  Hypertension: Beta-blocker per above Ulcerative colitis: Continue home mesalamine  GERD/esophagitis: Continue home pantoprazole .  Body mass index is 23.44 kg/m.    DVT prophylaxis:  IV heparin  gtts Code Status:  Full Code Diet:  Diet Orders (From admission, onward)     Start     Ordered   05/06/23 2112  Diet regular Room service appropriate? Yes; Fluid consistency: Thin  Diet effective now       Question Answer Comment  Room service  appropriate? Yes   Fluid consistency: Thin      05/06/23 2112  Family Communication: Yes discussed with family at the bedside Consults: None Admission status:   Inpatient, Telemetry bed  Severity of Illness: The appropriate patient status for this patient is INPATIENT. Inpatient status is judged to be reasonable and necessary in order to provide the required intensity of service to ensure the patient's safety. The patient's presenting symptoms, physical exam findings, and initial radiographic and laboratory data in the context of their chronic comorbidities is felt to place them at high risk for further clinical deterioration. Furthermore, it is not anticipated that the patient will be medically stable for discharge from the hospital within 2 midnights of admission.   * I certify that at the point of admission it is my clinical judgment that the patient will require inpatient hospital care spanning beyond 2 midnights from the point of admission due to high intensity of service, high risk for further deterioration and high frequency of surveillance required.*   Dorn Dawson, MD [facility]  How to contact the [facility] Attending or Consulting provider 7A - 7P or covering provider during after hours 7P -7A, for this patient.  Check the care team in [facility] and look for a) attending/consulting [facility] provider listed and b) the [facility] team listed Log into [email or web address] and use [facility] universal password to access. If you do not have the password, please contact the hospital operator. Locate the [facility] provider you are looking for under [facility] and page to a number that you can be directly reached. If you still have difficulty reaching the provider, please page the [facility] (Director on Call) for the Hospitalists listed on amion for assistance.  05/07/2023, 6:18 AM

## 2023-05-07 NOTE — Progress Notes (Signed)
 PHARMACY - ANTICOAGULATION CONSULT NOTE  Pharmacy Consult for Heparin  Drip Indication: pulmonary embolus  Allergies  Allergen Reactions   Penicillins Itching, Swelling and Rash   Covid-19 (Mrna) Vaccine Rash    Told by PCP to never take again.   Influenza Virus Vaccine Rash    Told by PCP to never take again   Tape Itching    Patient Measurements: Height: 5' (152.4 cm) Weight: 54.4 kg (120 lb) IBW/kg (Calculated) : 45.5 Heparin  Dosing Weight: 54.4 kg  Vital Signs: Temp: 98.2 F (36.8 C) (01/05 0541) Temp Source: Oral (01/05 0541) BP: 131/75 (01/05 0541) Pulse Rate: 74 (01/05 0541)  Labs: Recent Labs    05/06/23 1753 05/06/23 1943 05/07/23 0408 05/07/23 0758 05/07/23 1258  HGB 11.8*  --  11.5*  --   --   HCT 35.5*  --  33.9*  --   --   PLT 254  --  236  --   --   HEPARINUNFRC  --   --  0.21*  --  0.35  CREATININE 0.72  --  0.64  --   --   TROPONINIHS 14 14  --  12  --     Estimated Creatinine Clearance: 36.9 mL/min (by C-G formula based on SCr of 0.64 mg/dL).   Medical History: Past Medical History:  Diagnosis Date   Anemia    Arthritis    Cancer (HCC)    skin cancer on nose   Carpal tunnel syndrome, bilateral    Cataract    Esophagitis    GERD (gastroesophageal reflux disease)    H/O hiatal hernia    Osteopenia    Scoliosis    Shingles    SVT (supraventricular tachycardia) (HCC)    Ulcerative colitis    Assessment: Patient is a 88yo female presenting with SOB. CT reveals PE. Pharmacy consulted for Heparin  dosing.   HL 0.35, therapeutic. No issues with infusion  Goal of Therapy:  Heparin  level 0.3-0.7 units/ml Monitor platelets by anticoagulation protocol: Yes   Plan:  Continue  heparin  infusion at 1050 units/hr Check anti-Xa level in 8 hours and daily while on heparin  Continue to monitor H&H and platelets  Adarryl Goldammer, BS Pharm D, BCPS Clinical Pharmacist 05/07/2023 2:08 PM

## 2023-05-08 ENCOUNTER — Telehealth: Payer: Self-pay | Admitting: Family Medicine

## 2023-05-08 ENCOUNTER — Inpatient Hospital Stay (HOSPITAL_COMMUNITY): Payer: Medicare Other

## 2023-05-08 DIAGNOSIS — I2699 Other pulmonary embolism without acute cor pulmonale: Secondary | ICD-10-CM | POA: Diagnosis not present

## 2023-05-08 DIAGNOSIS — I2609 Other pulmonary embolism with acute cor pulmonale: Secondary | ICD-10-CM

## 2023-05-08 LAB — VITAMIN B12: Vitamin B-12: 2878 pg/mL — ABNORMAL HIGH (ref 180–914)

## 2023-05-08 LAB — CBC
HCT: 32.6 % — ABNORMAL LOW (ref 36.0–46.0)
Hemoglobin: 11 g/dL — ABNORMAL LOW (ref 12.0–15.0)
MCH: 29.5 pg (ref 26.0–34.0)
MCHC: 33.7 g/dL (ref 30.0–36.0)
MCV: 87.4 fL (ref 80.0–100.0)
Platelets: 260 10*3/uL (ref 150–400)
RBC: 3.73 MIL/uL — ABNORMAL LOW (ref 3.87–5.11)
RDW: 13.7 % (ref 11.5–15.5)
WBC: 6.7 10*3/uL (ref 4.0–10.5)
nRBC: 0 % (ref 0.0–0.2)

## 2023-05-08 LAB — ECHOCARDIOGRAM COMPLETE
AR max vel: 2.13 cm2
AV Area VTI: 2.63 cm2
AV Area mean vel: 1.94 cm2
AV Mean grad: 4.1 mm[Hg]
AV Peak grad: 8.7 mm[Hg]
Ao pk vel: 1.48 m/s
Area-P 1/2: 2.99 cm2
Height: 60 in
S' Lateral: 1.8 cm
Weight: 1920 [oz_av]

## 2023-05-08 LAB — IRON AND TIBC
Iron: 16 ug/dL — ABNORMAL LOW (ref 28–170)
Saturation Ratios: 8 % — ABNORMAL LOW (ref 10.4–31.8)
TIBC: 202 ug/dL — ABNORMAL LOW (ref 250–450)
UIBC: 186 ug/dL

## 2023-05-08 LAB — FERRITIN: Ferritin: 205 ng/mL (ref 11–307)

## 2023-05-08 LAB — HEPARIN LEVEL (UNFRACTIONATED)
Heparin Unfractionated: 0.28 [IU]/mL — ABNORMAL LOW (ref 0.30–0.70)
Heparin Unfractionated: 0.46 [IU]/mL (ref 0.30–0.70)

## 2023-05-08 LAB — TRANSFERRIN: Transferrin: 145 mg/dL — ABNORMAL LOW (ref 192–382)

## 2023-05-08 MED ORDER — PERFLUTREN LIPID MICROSPHERE
1.0000 mL | INTRAVENOUS | Status: AC | PRN
  Administered 2023-05-08: 2 mL via INTRAVENOUS

## 2023-05-08 MED ORDER — FERROUS SULFATE 325 (65 FE) MG PO TABS
325.0000 mg | ORAL_TABLET | Freq: Every day | ORAL | Status: DC
  Administered 2023-05-09: 325 mg via ORAL
  Filled 2023-05-08: qty 1

## 2023-05-08 MED ORDER — SODIUM CHLORIDE 0.9 % IV SOLN
200.0000 mg | Freq: Once | INTRAVENOUS | Status: AC
  Administered 2023-05-08: 200 mg via INTRAVENOUS
  Filled 2023-05-08: qty 10

## 2023-05-08 NOTE — Telephone Encounter (Unsigned)
 Copied from CRM [id number]. Topic: Clinical - Lab/Test Results >> May 08, 2023  3:41 PM Lori Soto wrote: Reason for CRM: Patient needs to know what her iron level was on her appt 04/01/2023. Patient is currently in ED. Callback number is [phone]

## 2023-05-08 NOTE — Progress Notes (Addendum)
 PROGRESS NOTE  Lori Soto  FMW:[id number] DOB: 07-Apr-1936 DOA: 05/06/2023 PCP: Jolinda Norene HERO, DO   Brief Narrative: Patient is a 88 year old female with history of SVT, hypertension, ulcerative colitis, GERD/esophagitis who presented with acute onset of dyspnea.  On presentation, she was hemodynamically stable.  CT PE positive for lobar to segmental emboli bilaterally, groundglass opacity concerning for pulmonary infarct.  Currently on IV heparin .  Echocardiogram pending.  PT/OT consulted  Assessment & Plan:  Principal Problem:   Pulmonary embolism (HCC)  Bilateral PE: Presented with sudden onset of dyspnea.  CT PE showed pulmonary embolism with lobar to segmental embolus present bilaterally, also showed a ground-glass airspace opacity in the dependent right lower lobe  concerning for pulmonary infarct.  Currently on IV heparin .  Plan to change to DOAC but will wait for echocardiogram.  Currently pending.  PT/OT also consulted.  This morning she has been on room air.  Large Hiatal hernia/GERD: Imaging showed large hiatal hernia, containing stomach and part of small bowel, colon as per imagings.  Continue PPI.  Case discussed with general surgery, recommended to follow-up as an outpatient with [facility], no need of acute intervention.  Chronic normocytic anemia/iron  deficiency:currently hemoglobin stable.  Placed on oral iron  supplementation from tomorrow.  Will give her a dose of IV iron .  No report of hematochezia or melena.  Continue PPI  Mild leukocytosis: Resolved  Hypertension: Continue metoprolol   History of ulcerative colitis: On mesalamine   History of SVT: Currently in normal sinus rhythm.  On metoprolol        DVT prophylaxis: iv heparin      Code Status: Full Code  Family Communication: daughter at bedside  Patient status:Inpatient  Patient is from :home  Anticipated discharge un:ynfz  Estimated DC date:tomorrow   Consultants:  None  Procedures:None  Antimicrobials:  Anti-infectives (From admission, onward)    None       Subjective: Patient seen and examined at bedside today.  Hemodynamically stable.  Sitting in the chair.  Denies shortness of breath or chest pain.  On room air.  Very concerned about her hiatal hernia  Objective: Vitals:   05/07/23 0541 05/07/23 1638 05/07/23 2118 05/08/23 0503  BP: 131/75 (!) 146/87 (!) 160/87 (!) 145/77  Pulse: 74 83 92 80  Resp: (!) 24 20 (!) 23 20  Temp: 98.2 F (36.8 C) 99.2 F (37.3 C) 98.6 F (37 C) 98.6 F (37 C)  TempSrc: Oral Oral Oral Oral  SpO2: 94% 93% 95% 91%  Weight:      Height:        Intake/Output Summary (Last 24 hours) at 05/08/2023 1155 Last data filed at 05/08/2023 0900 Gross per 24 hour  Intake 840 ml  Output --  Net 840 ml   Filed Weights   05/06/23 1712  Weight: 54.4 kg    Examination:  General exam: Overall comfortable, not in distress HEENT: PERRL Respiratory system:  no wheezes, few crackles in the left lower lobe Cardiovascular system: S1 & S2 heard, RRR.  Gastrointestinal system: Abdomen is nondistended, soft and nontender. Central nervous system: Alert and oriented Extremities: No edema, no clubbing ,no cyanosis Skin: No rashes, no ulcers,no icterus     Data Reviewed: I have personally reviewed following labs and imaging studies  CBC: Recent Labs  Lab 05/06/23 1753 05/07/23 0408 05/08/23 0509  WBC 11.4* 9.5 6.7  NEUTROABS 8.8*  --   --   HGB 11.8* 11.5* 11.0*  HCT 35.5* 33.9* 32.6*  MCV 87.4 88.1  87.4  PLT 254 236 260   Basic Metabolic Panel: Recent Labs  Lab 05/06/23 1753 05/07/23 0408  NA 131* 138  K 3.6 3.8  CL 99 103  CO2 23 25  GLUCOSE 168* 103*  BUN 12 9  CREATININE 0.72 0.64  CALCIUM  8.9 8.9  MG  --  1.7  PHOS  --  4.0     No results found for this or any previous visit (from the past 240 hours).   Radiology Studies: US  Venous Img Lower Bilateral (DVT) Result Date:  05/07/2023 CLINICAL DATA:  Pulmonary embolism. Evaluate for deep vein thrombosis. EXAM: BILATERAL LOWER EXTREMITY VENOUS DOPPLER ULTRASOUND TECHNIQUE: Gray-scale sonography with graded compression, as well as color Doppler and duplex ultrasound were performed to evaluate the lower extremity deep venous systems from the level of the common femoral vein and including the common femoral, femoral, profunda femoral, popliteal and calf veins including the posterior tibial, peroneal and gastrocnemius veins when visible. The superficial great saphenous vein was also interrogated. Spectral Doppler was utilized to evaluate flow at rest and with distal augmentation maneuvers in the common femoral, femoral and popliteal veins. COMPARISON:  None Available. FINDINGS: RIGHT LOWER EXTREMITY Common Femoral Vein: No evidence of thrombus. Normal compressibility, respiratory phasicity and response to augmentation. Saphenofemoral Junction: No evidence of thrombus. Normal compressibility and flow on color Doppler imaging. Profunda Femoral Vein: No evidence of thrombus. Normal compressibility and flow on color Doppler imaging. Femoral Vein: No evidence of thrombus. Normal compressibility, respiratory phasicity and response to augmentation. Popliteal Vein: No evidence of thrombus. Normal compressibility, respiratory phasicity and response to augmentation. Calf Veins: No evidence of thrombus. Normal compressibility and flow on color Doppler imaging. Superficial Great Saphenous Vein: No evidence of thrombus. Normal compressibility. Venous Reflux:  None. Other Findings:  None. LEFT LOWER EXTREMITY Common Femoral Vein: No evidence of thrombus. Normal compressibility, respiratory phasicity and response to augmentation. Saphenofemoral Junction: No evidence of thrombus. Normal compressibility and flow on color Doppler imaging. Profunda Femoral Vein: No evidence of thrombus. Normal compressibility and flow on color Doppler imaging. Femoral Vein: No  evidence of thrombus. Normal compressibility, respiratory phasicity and response to augmentation. Popliteal Vein: No evidence of thrombus. Normal compressibility, respiratory phasicity and response to augmentation. Calf Veins: No evidence of thrombus. Normal compressibility and flow on color Doppler imaging. Superficial Great Saphenous Vein: No evidence of thrombus. Normal compressibility. Venous Reflux:  None. Other Findings:  None. IMPRESSION: No evidence of deep venous thrombosis in either lower extremity. Electronically Signed   By: Waddell Calk M.D.   On: 05/07/2023 17:25   CT Angio Chest PE W/Cm &/Or Wo Cm Result Date: 05/06/2023 CLINICAL DATA:  PE suspected, palpitations EXAM: CT ANGIOGRAPHY CHEST WITH CONTRAST TECHNIQUE: Multidetector CT imaging of the chest was performed using the standard protocol during bolus administration of intravenous contrast. Multiplanar CT image reconstructions and MIPs were obtained to evaluate the vascular anatomy. RADIATION DOSE REDUCTION: This exam was performed according to the departmental dose-optimization program which includes automated exposure control, adjustment of the mA and/or kV according to patient size and/or use of iterative reconstruction technique. CONTRAST:  75mL OMNIPAQUE  IOHEXOL  350 MG/ML SOLN COMPARISON:  None Available. FINDINGS: Cardiovascular: Satisfactory opacification of the pulmonary arteries to the segmental level. Positive examination for pulmonary embolism with lobar to segmental embolus present bilaterally. Normal heart size. RV LV ratio 1.0, however of uncertain reliability given very substantial mass effect of large hiatal hernia. Left coronary artery calcifications no pericardial effusion. Mediastinum/Nodes: No enlarged mediastinal, hilar,  or axillary lymph nodes. Unusually large hiatal hernia containing the stomach and a great deal of bowel. Thyroid  gland, trachea, and esophagus demonstrate no significant findings. Lungs/Pleura: Small right  pleural effusion. Ground-glass airspace opacity in the dependent right lower lobe which is distal to a large burden of embolus in the right lower lobe pulmonary arteries. Compressive atelectasis of the lungs secondary to large hiatal hernia. Upper Abdomen: No acute abnormality. Musculoskeletal: No chest wall abnormality. No acute osseous findings. Review of the MIP images confirms the above findings. IMPRESSION: 1. Positive examination for pulmonary embolism with lobar to segmental embolus present bilaterally. 2. Ground-glass airspace opacity in the dependent right lower lobe which is distal to a large burden of embolus in the right lower lobe pulmonary arteries. This is concerning for pulmonary infarct. 3. Small right pleural effusion, likely reactive. 4. RV LV ratio 1.0, however of uncertain reliability given very substantial mass effect of large hiatal hernia. 5. Unusually large hiatal hernia containing the stomach and a large portion of the small bowel and colon. 6. Coronary artery disease. Call report request was placed at the time of interpretation. Report issued at this time in the interest of expediency. Final communication of critical findings will be documented. Electronically Signed   By: Marolyn JONETTA Jaksch M.D.   On: 05/06/2023 19:18   DG Chest 2 View Result Date: 05/06/2023 CLINICAL DATA:  Shortness of breath EXAM: CHEST - 2 VIEW COMPARISON:  11/23/2022 FINDINGS: Large hiatal hernia is again identified. Cardiac shadow is enlarged but stable. Lungs are well aerated bilaterally. No focal infiltrate or effusion is noted IMPRESSION: Large hiatal hernia.  No other focal abnormality is noted. Electronically Signed   By: Oneil Devonshire M.D.   On: 05/06/2023 17:45    Scheduled Meds:  Mesalamine   400 mg Oral Daily   metoprolol  tartrate  25 mg Oral BID   pantoprazole   40 mg Oral QAC breakfast   sodium chloride  flush  3 mL Intravenous Q12H   Continuous Infusions:  heparin  1,250 Units/hr (05/08/23 0826)      LOS: 2 days   Ivonne Mustache, MD [facility] P1/10/2023, 11:55 AM

## 2023-05-08 NOTE — Evaluation (Signed)
 Physical Therapy Evaluation Patient Details Name: Lori Soto MRN: [id number] DOB: [birth year] Today's Date: 05/08/2023  History of Present Illness  Lori Soto is a 88 y.o. female with hx of SVT, hypertension, ulcerative colitis, GERD/esophagitis, who presented due to acute onset of dyspnea with minimal exertion.  Reports that over the past week or 2 that she is felt fatigued.  However had no significant dyspnea until today. Now unable to go ~ 15 feet without having to stop.  Also had pain along her right flank which was new today.  No hx of VTE in the past, no underlying lung disease.  No recent sedentary behavior, plane/car ride.  Once she stands on her feet most of the day she gets swelling in both ankles also has a history of varicose veins.  However did not notice unilateral leg swelling discomfort or skin changes prior.  No other recent illness.    Clinical Impression  Patient lying in bed on therapist arrival, her daughter is at bedside.  Patient is pleasant and agreeable to physical therapy.  Patient modified independent with supine to sit with HOB elevated; she has an adjustable bed at home.  Patient takes extra time due to mild shortness of breath.  Noted good sitting balance on the edge of the bed with feet on the floor.  Patient performs sit to stand holding to bed railing for balance with CGA for safety and able to ambulate out in the hallway x 100 ft with one standing rest and occasional use of chair railing for balance; decreased gait speed noted and forward flexed trunk.  She states history of chronic back pain.  Patient is able to carry on a conversation while walking.  Mild fatigue at the end of her walk. On return to her room patient sits in the chair without issue.  patient left in chair with call button in reach, daughter present and nursing notified of mobility status. Patient will benefit from continued skilled therapy services during the remainder of her hospital stay and  at the next recommended venue of care to address deficits and promote return to optimal function.             If plan is discharge home, recommend the following: A little help with walking and/or transfers;A little help with bathing/dressing/bathroom;Help with stairs or ramp for entrance   Can travel by private vehicle        Equipment Recommendations None recommended by PT  Recommendations for Other Services       Functional Status Assessment       Precautions / Restrictions Precautions Precautions: None Restrictions Weight Bearing Restrictions Per Provider Order: No      Mobility  Bed Mobility Overal bed mobility: Modified Independent             General bed mobility comments: takes extra time for supine to sit with HOB elevated    Transfers Overall transfer level: Needs assistance Equipment used: None Transfers: Sit to/from Stand Sit to Stand: Contact guard assist           General transfer comment: takes extra time    Ambulation/Gait Ambulation/Gait assistance: Contact guard assist Gait Distance (Feet): 100 Feet Assistive device: None (occasionally furniture walks) Gait Pattern/deviations: Decreased step length - left, Decreased step length - right, Trunk flexed          Stairs            Wheelchair Mobility     Tilt Bed  Modified Rankin (Stroke Patients Only)       Balance Overall balance assessment: Needs assistance Sitting-balance support: Feet supported Sitting balance-Leahy Scale: Good Sitting balance - Comments: good sitting balance on edge of the bed     Standing balance-Leahy Scale: Fair Standing balance comment: fair to good standing balance ; has to occassionally reach out to hold to the chair railing or furniture walk                             Pertinent Vitals/Pain Pain Assessment Pain Assessment: 0-10 Pain Score: 0-No pain Pain Intervention(s): Monitored during session    Home Living  Family/patient expects to be discharged to:: Private residence Living Arrangements: Alone Available Help at Discharge: Family;Friend(s);Neighbor;Available PRN/intermittently Type of Home: House Home Access: Stairs to enter;Level entry Entrance Stairs-Rails: None;Left     Home Layout: Bed/bath upstairs (4 steps to get to her bedroom) Home Equipment: Agricultural Consult[employer] (2 wheels);Other (comment) (adjustable bed)      Prior Function Prior Level of Function : Independent/Modified Independent                     Extremity/Trunk Assessment   Upper Extremity Assessment Upper Extremity Assessment: Generalized weakness    Lower Extremity Assessment Lower Extremity Assessment: Generalized weakness    Cervical / Trunk Assessment Cervical / Trunk Assessment: Kyphotic  Communication   Communication Communication: No apparent difficulties  Cognition Arousal: Alert Behavior During Therapy: WFL for tasks assessed/performed Overall Cognitive Status: Within Functional Limits for tasks assessed                                 General Comments: mild sob with exertion noted        General Comments      Exercises     Assessment/Plan    PT Assessment Patient needs continued PT services  PT Problem List Decreased strength;Decreased activity tolerance;Cardiopulmonary status limiting activity       PT Treatment Interventions      PT Goals (Current goals can be found in the Care Plan section)  Acute Rehab PT Goals Patient Stated Goal: return home PT Goal Formulation: With patient/family Time For Goal Achievement: 05/22/23 Potential to Achieve Goals: Good    Frequency Min 2X/week     Co-evaluation               AM-PAC PT 6 Clicks Mobility  Outcome Measure Help needed turning from your back to your side while in a flat bed without using bedrails?: None Help needed moving from lying on your back to sitting on the side of a flat bed without using  bedrails?: None Help needed moving to and from a bed to a chair (including a wheelchair)?: A Little Help needed standing up from a chair using your arms (e.g., wheelchair or bedside chair)?: A Little Help needed to walk in hospital room?: A Little Help needed climbing 3-5 steps with a railing? : A Lot 6 Click Score: 19    End of Session   Activity Tolerance: Patient tolerated treatment well;Patient limited by fatigue Patient left: in chair;with family/visitor present;with call bell/phone within reach Nurse Communication: Mobility status PT Visit Diagnosis: Muscle weakness (generalized) (M62.81);Other abnormalities of gait and mobility (R26.89)    Time: 1030-1052 PT Time Calculation (min) (ACUTE ONLY): 22 min   Charges:   PT Evaluation $PT Eval Low Complexity: 1  Low   PT General Charges $$ ACUTE PT VISIT: 1 Visit         12:05 PM, 05/08/23 Damian Hofstra Small Jayline Kilburg MPT [facility] [place] [phone]

## 2023-05-08 NOTE — TOC Progression Note (Signed)
 Transition of Care [facility]) - Progression Note    Patient Details  Name: Lori Soto MRN: [id number] Date of Birth: 1935-07-30  Transition of [facility]) CM/SW Contact  [name], [place] Phone Number: 05/08/2023, 3:46 PM  Clinical Narrative:    CSW spoke with patient about PT recommendation . Patient stated that she was open to HHPT , but did not know what other [facility] services she may need. PT had no HH preference. CSW just reiterated that based on PT eval  they recommend HHPT. Pt then provided CSW with her cell number to use. TOC will continue to follow.      Barriers to Discharge: Continued Medical Work up  Expected Discharge Plan and Services    DC home with Home Health   Social Determinants of Health (SDOH) Interventions SDOH Screenings   Food Insecurity: No Food Insecurity (05/06/2023)  Housing: Unknown (05/06/2023)  Transportation Needs: No Transportation Needs (05/06/2023)  Utilities: Not At Risk (05/06/2023)  Alcohol Screen: Low Risk  (06/17/2022)  Depression (PHQ2-9): Low Risk  (11/15/2022)  Financial Resource Strain: Low Risk  (06/17/2022)  Physical Activity: Insufficiently Active (06/17/2022)  Social Connections: Moderately Integrated (05/06/2023)  Stress: No Stress Concern Present (06/17/2022)  Tobacco Use: Low Risk  (05/06/2023)  Health Literacy: Low Risk  (07/09/2020)   Received from [facility]    Readmission Risk Interventions     No data to display

## 2023-05-08 NOTE — Progress Notes (Signed)
 PHARMACY - ANTICOAGULATION CONSULT NOTE  Pharmacy Consult for Heparin  Drip Indication: pulmonary embolus  Allergies  Allergen Reactions   Penicillins Itching, Swelling and Rash   Covid-19 (Mrna) Vaccine Rash    Told by PCP to never take again.   Influenza Virus Vaccine Rash    Told by PCP to never take again   Tape Itching    Patient Measurements: Height: 5' (152.4 cm) Weight: 54.4 kg (120 lb) IBW/kg (Calculated) : 45.5 Heparin  Dosing Weight: 54.4 kg  Vital Signs: Temp: 98.6 F (37 C) (01/06 0503) Temp Source: Oral (01/06 0503) BP: 145/77 (01/06 0503) Pulse Rate: 80 (01/06 0503)  Labs: Recent Labs    05/06/23 1753 05/06/23 1753 05/06/23 1943 05/07/23 0408 05/07/23 0758 05/07/23 1258 05/07/23 2204 05/08/23 0509  HGB 11.8*  --   --  11.5*  --   --   --  11.0*  HCT 35.5*  --   --  33.9*  --   --   --  32.6*  PLT 254  --   --  236  --   --   --  260  HEPARINUNFRC  --    < >  --  0.21*  --  0.35 0.30 0.28*  CREATININE 0.72  --   --  0.64  --   --   --   --   TROPONINIHS 14  --  14  --  12  --   --   --    < > = values in this interval not displayed.    Estimated Creatinine Clearance: 36.9 mL/min (by C-G formula based on SCr of 0.64 mg/dL).   Assessment: Patient is a 88yo female presenting with SOB. CT reveals PE. Pharmacy consulted for Heparin  dosing.   HL 0.28, slightly subtherapeutic on 1150 units/hr. No issues with infusion or signs/symptoms of bleeding noted.    Goal of Therapy:  Heparin  level 0.3-0.7 units/ml Monitor platelets by anticoagulation protocol: Yes   Plan:  Increase heparin  infusion to 1250 units/hr Check anti-Xa level in ~8 hours and daily while on heparin  Continue to monitor H&H and platelets F/U plan to transition to po tx  Nikky Duba, BS Pharm D, BCPS Clinical Pharmacist 05/08/2023 8:10 AM

## 2023-05-08 NOTE — Progress Notes (Signed)
*  PRELIMINARY RESULTS* Echocardiogram 2D Echocardiogram has been performed with Definity.  Stacey Drain 05/08/2023, 5:09 PM

## 2023-05-08 NOTE — Plan of Care (Signed)
  Problem: Acute Rehab PT Goals(only PT should resolve) Goal: Pt Will Go Supine/Side To Sit Outcome: Progressing Flowsheets (Taken 05/08/2023 1206) Pt will go Supine/Side to Sit: Independently Goal: Patient Will Transfer Sit To/From Stand Outcome: Progressing Flowsheets (Taken 05/08/2023 1206) Patient will transfer sit to/from stand: Independently Goal: Pt Will Transfer Bed To Chair/Chair To Bed Outcome: Progressing Flowsheets (Taken 05/08/2023 1206) Pt will Transfer Bed to Chair/Chair to Bed: with supervision Goal: Pt Will Ambulate Outcome: Progressing Flowsheets (Taken 05/08/2023 1206) Pt will Ambulate:  > 125 feet  with supervision

## 2023-05-08 NOTE — Progress Notes (Signed)
 PHARMACY - ANTICOAGULATION CONSULT NOTE  Pharmacy Consult for Heparin  Drip Indication: pulmonary embolus  Allergies  Allergen Reactions   Penicillins Itching, Swelling and Rash   Covid-19 (Mrna) Vaccine Rash    Told by PCP to never take again.   Influenza Virus Vaccine Rash    Told by PCP to never take again   Tape Itching    Patient Measurements: Height: 5' (152.4 cm) Weight: 54.4 kg (120 lb) IBW/kg (Calculated) : 45.5 Heparin  Dosing Weight: 54.4 kg  Vital Signs: Temp: 98.6 F (37 C) (01/06 0503) Temp Source: Oral (01/06 0503) BP: 145/77 (01/06 0503) Pulse Rate: 80 (01/06 0503)  Labs: Recent Labs    05/06/23 1753 05/06/23 1943 05/07/23 0408 05/07/23 0758 05/07/23 1258 05/07/23 2204 05/08/23 0509 05/08/23 1551  HGB 11.8*  --  11.5*  --   --   --  11.0*  --   HCT 35.5*  --  33.9*  --   --   --  32.6*  --   PLT 254  --  236  --   --   --  260  --   HEPARINUNFRC  --   --  0.21*  --    < > 0.30 0.28* 0.46  CREATININE 0.72  --  0.64  --   --   --   --   --   TROPONINIHS 14 14  --  12  --   --   --   --    < > = values in this interval not displayed.    Estimated Creatinine Clearance: 36.9 mL/min (by C-G formula based on SCr of 0.64 mg/dL).   Assessment: Patient is a 88yo female presenting with SOB. CT reveals PE. Pharmacy consulted for Heparin  dosing.   Heparin  level now at goal on 1250 units/hr. No issues with infusion or signs/symptoms of bleeding noted.    Goal of Therapy:  Heparin  level 0.3-0.7 units/ml Monitor platelets by anticoagulation protocol: Yes   Plan:  Continue heparin  infusion at 1250 units/hr Check anti-Xa level daily while on heparin  Continue to monitor H&H and platelets F/U plan to transition to po tx  Dempsey Blush PharmD., BCPS Clinical Pharmacist 05/08/2023 4:28 PM

## 2023-05-09 ENCOUNTER — Telehealth (HOSPITAL_COMMUNITY): Payer: Self-pay | Admitting: Pharmacy Technician

## 2023-05-09 ENCOUNTER — Telehealth: Payer: Self-pay | Admitting: Family Medicine

## 2023-05-09 ENCOUNTER — Other Ambulatory Visit (HOSPITAL_COMMUNITY): Payer: Self-pay

## 2023-05-09 DIAGNOSIS — I2699 Other pulmonary embolism without acute cor pulmonale: Secondary | ICD-10-CM | POA: Diagnosis not present

## 2023-05-09 LAB — CBC
HCT: 32.6 % — ABNORMAL LOW (ref 36.0–46.0)
Hemoglobin: 10.7 g/dL — ABNORMAL LOW (ref 12.0–15.0)
MCH: 29.3 pg (ref 26.0–34.0)
MCHC: 32.8 g/dL (ref 30.0–36.0)
MCV: 89.3 fL (ref 80.0–100.0)
Platelets: 279 10*3/uL (ref 150–400)
RBC: 3.65 MIL/uL — ABNORMAL LOW (ref 3.87–5.11)
RDW: 13.7 % (ref 11.5–15.5)
WBC: 6.3 10*3/uL (ref 4.0–10.5)
nRBC: 0 % (ref 0.0–0.2)

## 2023-05-09 LAB — HEPARIN LEVEL (UNFRACTIONATED): Heparin Unfractionated: 0.51 [IU]/mL (ref 0.30–0.70)

## 2023-05-09 MED ORDER — PANTOPRAZOLE SODIUM 40 MG PO TBEC: 40.0000 mg | DELAYED_RELEASE_TABLET | Freq: Every day | ORAL | 0 refills | Status: DC

## 2023-05-09 MED ORDER — FERROUS SULFATE 325 (65 FE) MG PO TABS: 325.0000 mg | ORAL_TABLET | Freq: Every day | ORAL | 0 refills | Status: DC

## 2023-05-09 MED ORDER — APIXABAN 5 MG PO TABS: ORAL_TABLET | ORAL | 1 refills | Status: DC

## 2023-05-09 MED ORDER — APIXABAN 5 MG PO TABS: 5.0000 mg | ORAL_TABLET | Freq: Two times a day (BID) | ORAL | Status: DC

## 2023-05-09 MED ORDER — APIXABAN 5 MG PO TABS
10.0000 mg | ORAL_TABLET | Freq: Two times a day (BID) | ORAL | Status: DC
  Administered 2023-05-09: 10 mg via ORAL
  Filled 2023-05-09: qty 2

## 2023-05-09 NOTE — Discharge Summary (Signed)
 Physician Discharge Summary  Lori Soto FMW:[id number] DOB: 11/03/1935 DOA: 05/06/2023  PCP: Lori Norene HERO, DO  Admit date: 05/06/2023 Discharge date: 05/09/2023  Admitted From: Home Disposition:  Home  Discharge Condition:Stable CODE STATUS:FULL Diet recommendation: Heart Healthy  Brief/Interim Summary: Patient is a 88 year old female with history of SVT, hypertension, ulcerative colitis, GERD/esophagitis who presented with acute onset of dyspnea.  On presentation, she was hemodynamically stable.  CT PE positive for lobar to segmental emboli bilaterally, groundglass opacity concerning for pulmonary infarct.  Started on IV heparin .  Currently she is on room air, denies any chest pain or shortness of breath.  Ambulated with the physical therapy.  Echocardiogram showed normal EF, no sign of severe right heart strain.  Medically stable for discharge home today with Eliquis   Following problems were addressed during the hospitalization: Bilateral PE: Presented with sudden onset of dyspnea.  CT PE showed pulmonary embolism with lobar to segmental embolus present bilaterally, also showed a ground-glass airspace opacity in the dependent right lower lobe  concerning for pulmonary infarct.  Started on IV heparin .  Currently she is on room air, denies any chest pain or shortness of breath.  Ambulated with the physical therapy.  Echocardiogram showed normal EF, no sign of severe right heart strain.  Medically stable for discharge home today with Eliquis .  Patient has been advised to avoid strenuous activity for next 1 to 2 weeks  Large Hiatal hernia/GERD: Imaging showed large hiatal hernia, containing stomach and part of small bowel, colon as per imagings.  Continue PPI.  Case discussed with general surgery, recommended to follow-up as an outpatient with [facility], no need of acute intervention.   Chronic normocytic anemia/iron  deficiency:currently hemoglobin stable.  Placed on oral  iron  supplementation. Given her a dose of IV iron .  No report of hematochezia or melena.  Continue PPI   Mild leukocytosis: Resolved   Hypertension: Continue metoprolol    History of ulcerative colitis: On mesalamine    History of SVT: Currently in normal sinus rhythm.  On metoprolol     Discharge Diagnoses:  Principal Problem:   Pulmonary embolism [facility])    Discharge Instructions  Discharge Instructions     Diet - low sodium heart healthy   Complete by: As directed    Discharge instructions   Complete by: As directed    1)Please take prescribed medications as instructed 2)Follow up with your PCP in a week 3)Follow up with general surgery further evaluation of your hiatal hernia.  Name and number the provider group has been attached.  Call for appointment 4)Please avoid strenuous activity for next 1 to 2 weeks   Increase activity slowly   Complete by: As directed       Allergies as of 05/09/2023       Reactions   Penicillins Itching, Swelling, Rash   Covid-19 (mrna) Vaccine Rash   Told by PCP to never take again.   Influenza Virus Vaccine Rash   Told by PCP to never take again   Tape Itching        Medication List     TAKE these medications    ALPHA LIPOIC ACID PO Take 1 capsule by mouth daily.   apixaban  5 MG Tabs tablet Commonly known as: ELIQUIS  Take 2 tablets (10 mg total) by mouth 2 (two) times daily, THEN 1 tablet (5 mg total) 2 (two) times daily.   ferrous sulfate  325 (65 FE) MG tablet Take 1 tablet (325 mg total) by mouth daily with breakfast. Start  taking on: May 10, 2023   GINKGO BILOBA PO Take 1 capsule by mouth daily.   Glucosamine-Chondroitin Tabs Take 1 tablet by mouth daily.   GRAPESEED EXTRACT PO Take 1 capsule by mouth daily.   HYALURONIC ACID PO Take 1 capsule by mouth daily.   LUTEIN PO Take 1 tablet by mouth daily.   Mesalamine  400 MG Cpdr DR capsule Commonly known as: ASACOL  TAKE 2 CAPSULES BY MOUTH DAILY What changed:  how much to take   metoprolol  tartrate 25 MG tablet Commonly known as: LOPRESSOR  Take 1 tablet (25 mg total) by mouth 2 (two) times daily.   Multivitamin Women 50+ Tabs Take 1 tablet by mouth daily.   OVER THE COUNTER MEDICATION Take 1 capsule by mouth daily. OTC : CoQ10 + red yeast rice   pantoprazole  40 MG tablet Commonly known as: PROTONIX  Take 1 tablet (40 mg total) by mouth daily before breakfast. Start taking on: May 10, 2023 What changed:  medication strength how much to take   TURMERIC CURCUMIN PO Take 1 tablet by mouth 2 (two) times daily.   TYLENOL  PO Take 1 tablet by mouth daily as needed (pain, headache).   VITAMIN B 12 PO Take 1 tablet by mouth daily.   VITAMIN B-6 PO Take 1 tablet by mouth daily.   ZINC PO Take 1 tablet by mouth daily.        Follow-up Information     [facility], [facility]. Schedule an appointment as soon as possible for a visit in 2 week(s).   Specialty: General Surgery Contact information: [address] [place] [address] [phone]         Lori Norene HERO, DO. Schedule an appointment as soon as possible for a visit in 1 week(s).   Specialty: Family Medicine Contact information: [address] [place] [address] [phone]                Allergies  Allergen Reactions   Penicillins Itching, Swelling and Rash   Covid-19 (Mrna) Vaccine Rash    Told by PCP to never take again.   Influenza Virus Vaccine Rash    Told by PCP to never take again   Tape Itching    Consultations: None   Procedures/Studies: ECHOCARDIOGRAM COMPLETE Result Date: 05/08/2023    ECHOCARDIOGRAM REPORT   Patient Name:   Lori Soto Date of Exam: 05/08/2023 Medical Rec #:  [id number]         Height:       60.0 in Accession #:    [id number]        Weight:       120.0 lb Date of Birth:  03/15/1936         BSA:          1.502 m Patient Age:    87 years          BP:           146/87 mmHg Patient Gender: F                  HR:           83 bpm. Exam Location:  Lori Soto Procedure: 2D Echo, Cardiac Doppler, Color Doppler and Intracardiac            Opacification Agent Indications:    Pulomnary Embolus l26.09  History:        Patient has prior history of Echocardiogram examinations, most  recent 12/31/2009. Signs/Symptoms:Murmur; Risk                 Factors:Hypertension.  Sonographer:    Aida Pizza RCS Referring Phys: [id number] VIJAYA AKULA IMPRESSIONS  1. Left ventricular ejection fraction, by estimation, is 60 to 65%. The left ventricle has normal function. The left ventricle has no regional wall motion abnormalities. Left ventricular diastolic parameters are indeterminate.  2. Right ventricular systolic function is low normal. The right ventricular size is moderately enlarged. There is mildly elevated pulmonary artery systolic pressure.  3. A small pericardial effusion is present. The pericardial effusion is anterior to the right ventricle. There is no evidence of cardiac tamponade.  4. The mitral valve is normal in structure. No evidence of mitral valve regurgitation. No evidence of mitral stenosis.  5. The tricuspid valve is abnormal.  6. The aortic valve is tricuspid. Aortic valve regurgitation is not visualized. No aortic stenosis is present.  7. The inferior vena cava is normal in size with greater than 50% respiratory variability, suggesting right atrial pressure of 3 mmHg. FINDINGS  Left Ventricle: Left ventricular ejection fraction, by estimation, is 60 to 65%. The left ventricle has normal function. The left ventricle has no regional wall motion abnormalities. Definity  contrast agent was given IV to delineate the left ventricular  endocardial borders. The left ventricular internal cavity size was normal in size. There is no left ventricular hypertrophy. Left ventricular diastolic parameters are indeterminate. Right Ventricle: The right ventricular size is moderately enlarged. Right vetricular wall  thickness was not well visualized. Right ventricular systolic function is low normal. There is mildly elevated pulmonary artery systolic pressure. The tricuspid regurgitant velocity is 2.80 m/s, and with an assumed right atrial pressure of 3 mmHg, the estimated right ventricular systolic pressure is 34.4 mmHg. Left Atrium: Left atrial size was normal in size. Right Atrium: Right atrial size was normal in size. Pericardium: A small pericardial effusion is present. The pericardial effusion is anterior to the right ventricle. There is no evidence of cardiac tamponade. Mitral Valve: The mitral valve is normal in structure. No evidence of mitral valve regurgitation. No evidence of mitral valve stenosis. Tricuspid Valve: The tricuspid valve is abnormal. Tricuspid valve regurgitation is mild . No evidence of tricuspid stenosis. Aortic Valve: The aortic valve is tricuspid. Aortic valve regurgitation is not visualized. No aortic stenosis is present. Aortic valve mean gradient measures 4.1 mmHg. Aortic valve peak gradient measures 8.7 mmHg. Aortic valve area, by VTI measures 2.63 cm. Pulmonic Valve: The pulmonic valve was not well visualized. Pulmonic valve regurgitation is not visualized. No evidence of pulmonic stenosis. Aorta: The aortic root is normal in size and structure. Venous: The inferior vena cava is normal in size with greater than 50% respiratory variability, suggesting right atrial pressure of 3 mmHg. IAS/Shunts: The interatrial septum was not well visualized.  LEFT VENTRICLE PLAX 2D LVIDd:         3.30 cm   Diastology LVIDs:         1.80 cm   LV e' medial:    5.22 cm/s LV PW:         0.90 cm   LV E/e' medial:  19.9 LV IVS:        0.80 cm   LV e' lateral:   5.00 cm/s LVOT diam:     1.90 cm   LV E/e' lateral: 20.8 LV SV:         69 LV SV Index:   46 LVOT Area:  2.84 cm  RIGHT VENTRICLE RV S prime:     13.70 cm/s TAPSE (M-mode): 2.0 cm LEFT ATRIUM             Index        RIGHT ATRIUM           Index LA diam:         2.80 cm 1.86 cm/m   RA Area:     15.30 cm LA Vol (A2C):   28.0 ml 18.64 ml/m  RA Volume:   37.10 ml  24.69 ml/m LA Vol (A4C):   73.9 ml 49.19 ml/m LA Biplane Vol: 48.2 ml 32.08 ml/m  AORTIC VALVE AV Area (Vmax):    2.13 cm AV Area (Vmean):   1.94 cm AV Area (VTI):     2.63 cm AV Vmax:           147.68 cm/s AV Vmean:          94.277 cm/s AV VTI:            0.262 m AV Peak Grad:      8.7 mmHg AV Mean Grad:      4.1 mmHg LVOT Vmax:         111.00 cm/s LVOT Vmean:        64.600 cm/s LVOT VTI:          0.243 m LVOT/AV VTI ratio: 0.93  AORTA Ao Root diam: 2.90 cm MITRAL VALVE                TRICUSPID VALVE MV Area (PHT): 2.99 cm     TR Peak grad:   31.4 mmHg MV Decel Time: 254 msec     TR Vmax:        280.00 cm/s MV E velocity: 104.00 cm/s MV A velocity: 96.80 cm/s   SHUNTS MV E/A ratio:  1.07         Systemic VTI:  0.24 m                             Systemic Diam: 1.90 cm Dorn Ross MD Electronically signed by Dorn Ross MD Signature Date/Time: 05/08/2023/5:25:08 PM    Final    US  Venous Img Lower Bilateral (DVT) Result Date: 05/07/2023 CLINICAL DATA:  Pulmonary embolism. Evaluate for deep vein thrombosis. EXAM: BILATERAL LOWER EXTREMITY VENOUS DOPPLER ULTRASOUND TECHNIQUE: Gray-scale sonography with graded compression, as well as color Doppler and duplex ultrasound were performed to evaluate the lower extremity deep venous systems from the level of the common femoral vein and including the common femoral, femoral, profunda femoral, popliteal and calf veins including the posterior tibial, peroneal and gastrocnemius veins when visible. The superficial great saphenous vein was also interrogated. Spectral Doppler was utilized to evaluate flow at rest and with distal augmentation maneuvers in the common femoral, femoral and popliteal veins. COMPARISON:  None Available. FINDINGS: RIGHT LOWER EXTREMITY Common Femoral Vein: No evidence of thrombus. Normal compressibility, respiratory phasicity and  response to augmentation. Saphenofemoral Junction: No evidence of thrombus. Normal compressibility and flow on color Doppler imaging. Profunda Femoral Vein: No evidence of thrombus. Normal compressibility and flow on color Doppler imaging. Femoral Vein: No evidence of thrombus. Normal compressibility, respiratory phasicity and response to augmentation. Popliteal Vein: No evidence of thrombus. Normal compressibility, respiratory phasicity and response to augmentation. Calf Veins: No evidence of thrombus. Normal compressibility and flow on color Doppler imaging. Superficial Great Saphenous Vein: No evidence of thrombus. Normal  compressibility. Venous Reflux:  None. Other Findings:  None. LEFT LOWER EXTREMITY Common Femoral Vein: No evidence of thrombus. Normal compressibility, respiratory phasicity and response to augmentation. Saphenofemoral Junction: No evidence of thrombus. Normal compressibility and flow on color Doppler imaging. Profunda Femoral Vein: No evidence of thrombus. Normal compressibility and flow on color Doppler imaging. Femoral Vein: No evidence of thrombus. Normal compressibility, respiratory phasicity and response to augmentation. Popliteal Vein: No evidence of thrombus. Normal compressibility, respiratory phasicity and response to augmentation. Calf Veins: No evidence of thrombus. Normal compressibility and flow on color Doppler imaging. Superficial Great Saphenous Vein: No evidence of thrombus. Normal compressibility. Venous Reflux:  None. Other Findings:  None. IMPRESSION: No evidence of deep venous thrombosis in either lower extremity. Electronically Signed   By: Waddell Calk M.D.   On: 05/07/2023 17:25   CT Angio Chest PE W/Cm &/Or Wo Cm Result Date: 05/06/2023 CLINICAL DATA:  PE suspected, palpitations EXAM: CT ANGIOGRAPHY CHEST WITH CONTRAST TECHNIQUE: Multidetector CT imaging of the chest was performed using the standard protocol during bolus administration of intravenous contrast.  Multiplanar CT image reconstructions and MIPs were obtained to evaluate the vascular anatomy. RADIATION DOSE REDUCTION: This exam was performed according to the departmental dose-optimization program which includes automated exposure control, adjustment of the mA and/or kV according to patient size and/or use of iterative reconstruction technique. CONTRAST:  75mL OMNIPAQUE  IOHEXOL  350 MG/ML SOLN COMPARISON:  None Available. FINDINGS: Cardiovascular: Satisfactory opacification of the pulmonary arteries to the segmental level. Positive examination for pulmonary embolism with lobar to segmental embolus present bilaterally. Normal heart size. RV LV ratio 1.0, however of uncertain reliability given very substantial mass effect of large hiatal hernia. Left coronary artery calcifications no pericardial effusion. Mediastinum/Nodes: No enlarged mediastinal, hilar, or axillary lymph nodes. Unusually large hiatal hernia containing the stomach and a great deal of bowel. Thyroid  gland, trachea, and esophagus demonstrate no significant findings. Lungs/Pleura: Small right pleural effusion. Ground-glass airspace opacity in the dependent right lower lobe which is distal to a large burden of embolus in the right lower lobe pulmonary arteries. Compressive atelectasis of the lungs secondary to large hiatal hernia. Upper Abdomen: No acute abnormality. Musculoskeletal: No chest wall abnormality. No acute osseous findings. Review of the MIP images confirms the above findings. IMPRESSION: 1. Positive examination for pulmonary embolism with lobar to segmental embolus present bilaterally. 2. Ground-glass airspace opacity in the dependent right lower lobe which is distal to a large burden of embolus in the right lower lobe pulmonary arteries. This is concerning for pulmonary infarct. 3. Small right pleural effusion, likely reactive. 4. RV LV ratio 1.0, however of uncertain reliability given very substantial mass effect of large hiatal hernia.  5. Unusually large hiatal hernia containing the stomach and a large portion of the small bowel and colon. 6. Coronary artery disease. Call report request was placed at the time of interpretation. Report issued at this time in the interest of expediency. Final communication of critical findings will be documented. Electronically Signed   By: Marolyn JONETTA Jaksch M.D.   On: 05/06/2023 19:18   DG Chest 2 View Result Date: 05/06/2023 CLINICAL DATA:  Shortness of breath EXAM: CHEST - 2 VIEW COMPARISON:  11/23/2022 FINDINGS: Large hiatal hernia is again identified. Cardiac shadow is enlarged but stable. Lungs are well aerated bilaterally. No focal infiltrate or effusion is noted IMPRESSION: Large hiatal hernia.  No other focal abnormality is noted. Electronically Signed   By: Oneil Devonshire M.D.   On: 05/06/2023 17:45  Subjective: Patient seen and examined at bedside today.  Hemodynamically stable.  Comfortable, denies any worsening shortness of breath or cough.  On room air.  Speaking in full sentences.  Denies any chest pain.  Medically stable for discharge.  Discharge plan discussed with daughter at bedside.  Discharge Exam: Vitals:   05/08/23 2150 05/09/23 0426  BP: 125/78 135/76  Pulse: 88 73  Resp: 20 (!) 21  Temp: 98.5 F (36.9 C) 98.4 F (36.9 C)  SpO2: 93% 93%   Vitals:   05/07/23 2118 05/08/23 0503 05/08/23 2150 05/09/23 0426  BP: (!) 160/87 (!) 145/77 125/78 135/76  Pulse: 92 80 88 73  Resp: (!) 23 20 20  (!) 21  Temp: 98.6 F (37 C) 98.6 F (37 C) 98.5 F (36.9 C) 98.4 F (36.9 C)  TempSrc: Oral Oral Oral Oral  SpO2: 95% 91% 93% 93%  Weight:      Height:        General: Pt is alert, awake, not in acute distress Cardiovascular: RRR, S1/S2 +, no rubs, no gallops Respiratory: CTA bilaterally, no wheezing, no rhonchi Abdominal: Soft, NT, ND, bowel sounds + Extremities: no edema, no cyanosis    The results of significant diagnostics from this hospitalization (including  imaging, microbiology, ancillary and laboratory) are listed below for reference.     Microbiology: No results found for this or any previous visit (from the past 240 hours).   Labs: BNP (last 3 results) Recent Labs    05/06/23 1753  BNP 208.0*   Basic Metabolic Panel: Recent Labs  Lab 05/06/23 1753 05/07/23 0408  NA 131* 138  K 3.6 3.8  CL 99 103  CO2 23 25  GLUCOSE 168* 103*  BUN 12 9  CREATININE 0.72 0.64  CALCIUM  8.9 8.9  MG  --  1.7  PHOS  --  4.0   Liver Function Tests: No results for input(s): AST, ALT, ALKPHOS, BILITOT, PROT, ALBUMIN in the last 168 hours. No results for input(s): LIPASE, AMYLASE in the last 168 hours. No results for input(s): AMMONIA in the last 168 hours. CBC: Recent Labs  Lab 05/06/23 1753 05/07/23 0408 05/08/23 0509 05/09/23 0433  WBC 11.4* 9.5 6.7 6.3  NEUTROABS 8.8*  --   --   --   HGB 11.8* 11.5* 11.0* 10.7*  HCT 35.5* 33.9* 32.6* 32.6*  MCV 87.4 88.1 87.4 89.3  PLT 254 236 260 279   Cardiac Enzymes: No results for input(s): CKTOTAL, CKMB, CKMBINDEX, TROPONINI in the last 168 hours. BNP: Invalid input(s): POCBNP CBG: No results for input(s): GLUCAP in the last 168 hours. D-Dimer No results for input(s): DDIMER in the last 72 hours. Hgb A1c No results for input(s): HGBA1C in the last 72 hours. Lipid Profile No results for input(s): CHOL, HDL, LDLCALC, TRIG, CHOLHDL, LDLDIRECT in the last 72 hours. Thyroid  function studies No results for input(s): TSH, T4TOTAL, T3FREE, THYROIDAB in the last 72 hours.  Invalid input(s): FREET3 Anemia work up Recent Labs    05/08/23 0509  VITAMINB12 2,878*  FERRITIN 205  TIBC 202*  IRON  16*   Urinalysis    Component Value Date/Time   APPEARANCEUR Clear 05/31/2022 1056   GLUCOSEU Negative 05/31/2022 1056   BILIRUBINUR Negative 05/31/2022 1056   PROTEINUR Negative 05/31/2022 1056   NITRITE Negative 05/31/2022 1056    LEUKOCYTESUR 1+ (A) 05/31/2022 1056   Sepsis Labs Recent Labs  Lab 05/06/23 1753 05/07/23 0408 05/08/23 0509 05/09/23 0433  WBC 11.4* 9.5 6.7 6.3   Microbiology No  results found for this or any previous visit (from the past 240 hours).  Please note: You were cared for by a hospitalist during your hospital stay. Once you are discharged, your primary care physician will handle any further medical issues. Please note that NO REFILLS for any discharge medications will be authorized once you are discharged, as it is imperative that you return to your primary care physician (or establish a relationship with a primary care physician if you do not have one) for your post hospital discharge needs so that they can reassess your need for medications and monitor your lab values.    Time coordinating discharge: 40 minutes  SIGNED:   Ivonne Mustache, MD  [facility] 05/09/2023, 11:31 AM Pager [phone]  If 7PM-7AM, please contact night-coverage [email or web address] Password [email or web address]

## 2023-05-09 NOTE — Care Management Important Message (Signed)
 Important Message  Patient Details  Name: Lori Soto MRN: [id number] Date of Birth: Sep 18, 1935   Important Message Given:  Yes - Medicare IM     Corey Harold 05/09/2023, 11:36 AM

## 2023-05-09 NOTE — Progress Notes (Signed)
 Mobility Specialist Progress Note:    05/09/23 1146  Mobility  Activity Ambulated with assistance in hallway;Ambulated with assistance to bathroom  Level of Assistance Contact guard assist, steadying assist  Assistive Device None  Distance Ambulated (ft) 180 ft  Range of Motion/Exercises Active;All extremities  Activity Response Tolerated well  Mobility Referral Yes  Mobility visit 1 Mobility  Mobility Specialist Start Time (ACUTE ONLY) 1105  Mobility Specialist Stop Time (ACUTE ONLY) 1130  Mobility Specialist Time Calculation (min) (ACUTE ONLY) 25 min   Pt received in bed, daughter in room. Agreeable to mobility, required CGA to stand and ambulate with no AD. Tolerated well, SOB at EOS. SpO2 97% on RA. Left pt sitting EOB, all needs met.   Sherrilee Ditty Mobility Specialist Please contact via Special Educational Needs Teacher or  Rehab office at [phone]

## 2023-05-09 NOTE — Telephone Encounter (Signed)
 Patient Product/process Development Scientist completed.    The patient is insured through [facility]. Patient has Medicare and is not eligible for a copay card, but may be able to apply for patient assistance or Medicare RX Payment Plan (Patient Must reach out to their plan, if eligible for payment plan), if available.    Ran test claim for Eliquis  5 mg and the current 30 day co-pay is $30.60.   This test claim was processed through [facility]- copay amounts may vary at other pharmacies due to pharmacy/plan contracts, or as the patient moves through the different stages of their insurance plan.     Reyes Sharps, CPHT Pharmacy Technician III Certified Patient Advocate [facility] Patient Advocate Team Direct Number: [phone]  Fax: [phone]

## 2023-05-09 NOTE — Telephone Encounter (Signed)
 Called patient to schedule a HFU appt. Explained to patient that after reviewing Dr Melba schedule over the next 2 weeks, she doesn't currently have any openings but that we could deff get her scheduled to come in and see one of the NP's that work next to Dr Jolinda. Patient got very upset and said that her health means nothing to Dr Jolinda and wonders what Dr Georgina would have to say about this. Patient refused to make an appt and said to make sure Dr Jolinda got her message.   Copied from CRM [id number]. Topic: Appointments - Scheduling Inquiry for Clinic >> May 09, 2023  1:54 PM Deleta HERO wrote: Reason for CRM: The patient needs to schedule a hospital follow-up within the next 2 weeks, however there is no openings with her PCP in [employer] within the next 2 weeks. Callback #: [phone]

## 2023-05-09 NOTE — Plan of Care (Signed)
   Problem: Health Behavior/Discharge Planning: Goal: Ability to manage health-related needs will improve Outcome: Progressing   Problem: Education: Goal: Knowledge of General Education information will improve Description: Including pain rating scale, medication(s)/side effects and non-pharmacologic comfort measures Outcome: Progressing

## 2023-05-09 NOTE — Telephone Encounter (Signed)
 Lmtcb

## 2023-05-09 NOTE — Consult Note (Signed)
 [facility] Liaison Note  05/09/2023  Lori Soto Feb 16, 1936 [id number]  Location: RN Hospital Liaison screened the patient remotely at [facility].  Insurance: Medicare   Lori Soto is a 88 y.o. female who is a Primary Care Patient of Jolinda Norene HERO, DO ([facility] Medicine). The patient was screened for  readmission hospitalization with noted low risk score for unplanned readmission risk with 1 IP/2 ED in 6 months.  The patient was assessed for potential Care Management service needs for post hospital transition for care coordination. Review of patient's electronic medical record reveals patient was admitted with Pulmonary Embolism. Pt receptive to discharging home with Art[facility] with [facility] for HHPT/Aide services.  No anticipated needs for VBCI to address at this time.   Plan: [facility] Liaison will continue to follow progress and disposition to asess for post hospital community care coordination/management needs.  Referral request for community care coordination: anticipate Transitions of Care Team follow up.   VBCI Care Management/Population Health does not replace or interfere with any arrangements made by the Inpatient Transition of Care team.   For questions contact:   Olam Ku, RN, [facility] Liaison [place]   [facility], Population Health Office Hours MTWF  8:00 am-6:00 pm Direct Dial: [phone] mobile [phone] [Office toll free line] Office Hours are M-F 8:30 - 5 pm [email or web address]

## 2023-05-09 NOTE — Progress Notes (Addendum)
 PHARMACY - ANTICOAGULATION CONSULT NOTE  Pharmacy Consult for Heparin  Drip Indication: pulmonary embolus  Allergies  Allergen Reactions   Penicillins Itching, Swelling and Rash   Covid-19 (Mrna) Vaccine Rash    Told by PCP to never take again.   Influenza Virus Vaccine Rash    Told by PCP to never take again   Tape Itching    Patient Measurements: Height: 5' (152.4 cm) Weight: 54.4 kg (120 lb) IBW/kg (Calculated) : 45.5 Heparin  Dosing Weight: 54.4 kg  Vital Signs: Temp: 98.4 F (36.9 C) (01/07 0426) Temp Source: Oral (01/07 0426) BP: 135/76 (01/07 0426) Pulse Rate: 73 (01/07 0426)  Labs: Recent Labs    05/06/23 1753 05/06/23 1943 05/07/23 0408 05/07/23 0758 05/07/23 1258 05/08/23 0509 05/08/23 1551 05/09/23 0433  HGB 11.8*  --  11.5*  --   --  11.0*  --  10.7*  HCT 35.5*  --  33.9*  --   --  32.6*  --  32.6*  PLT 254  --  236  --   --  260  --  279  HEPARINUNFRC  --   --  0.21*  --    < > 0.28* 0.46 0.51  CREATININE 0.72  --  0.64  --   --   --   --   --   TROPONINIHS 14 14  --  12  --   --   --   --    < > = values in this interval not displayed.    Estimated Creatinine Clearance: 36.9 mL/min (by C-G formula based on SCr of 0.64 mg/dL).   Assessment: Patient is a 88yo female presenting with SOB. CT reveals PE. Pharmacy consulted for Heparin  dosing.   Heparin  level continues to be at goal on 1250 units/hr. No issues with infusion or signs/symptoms of bleeding noted. CBC stable overnight.   New orders to transition patient over to apixaban  this am. Benefits check in process.   Goal of Therapy:  Heparin  level 0.3-0.7 units/ml Monitor platelets by anticoagulation protocol: Yes   Plan:  Heparin  to apixaban  10mg  bid x 7 days then 5mg  bid Will provide education prior to discharge  Dempsey Blush PharmD., BCPS Clinical Pharmacist 05/09/2023 8:13 AM

## 2023-05-09 NOTE — Discharge Instructions (Signed)
 Information on my medicine - ELIQUIS  (apixaban )  Why was Eliquis  prescribed for you? Eliquis  was prescribed to treat blood clots that may have been found in the veins of your legs (deep vein thrombosis) or in your lungs (pulmonary embolism) and to reduce the risk of them occurring again.  What do You need to know about Eliquis  ? The starting dose is 10 mg (two 5 mg tablets) taken TWICE daily for the FIRST SEVEN (7) DAYS, then on (enter date)  05/16/23  the dose is reduced to ONE 5 mg tablet taken TWICE daily.  Eliquis  may be taken with or without food.   Try to take the dose about the same time in the morning and in the evening. If you have difficulty swallowing the tablet whole please discuss with your pharmacist how to take the medication safely.  Take Eliquis  exactly as prescribed and DO NOT stop taking Eliquis  without talking to the doctor who prescribed the medication.  Stopping may increase your risk of developing a new blood clot.  Refill your prescription before you run out.  After discharge, you should have regular check-up appointments with your healthcare provider that is prescribing your Eliquis .    What do you do if you miss a dose? If a dose of ELIQUIS  is not taken at the scheduled time, take it as soon as possible on the same day and twice-daily administration should be resumed. The dose should not be doubled to make up for a missed dose.  Important Safety Information A possible side effect of Eliquis  is bleeding. You should call your healthcare provider right away if you experience any of the following: Bleeding from an injury or your nose that does not stop. Unusual colored urine (red or dark brown) or unusual colored stools (red or black). Unusual bruising for unknown reasons. A serious fall or if you hit your head (even if there is no bleeding).  Some medicines may interact with Eliquis  and might increase your risk of bleeding or clotting while on Eliquis . To  help avoid this, consult your healthcare provider or pharmacist prior to using any new prescription or non-prescription medications, including herbals, vitamins, non-steroidal anti-inflammatory drugs (NSAIDs) and supplements.  This website has more information on Eliquis  (apixaban ): http://[email or web address] .com/eliquis dena

## 2023-05-09 NOTE — TOC Progression Note (Signed)
 Transition of Care [facility]) - Progression Note    Patient Details  Name: Lori Soto MRN: [id number] Date of Birth: Sep 15, 1935  Transition [facility]) CM/SW Contact  [name], [place] Phone Number: 05/09/2023, 11:37 AM  Clinical Narrative:    CSW spoke with pt and daughter at bedside about home health options. CSW shared that [facility] with [facility] will accept referral. CSW did ask pt about adding a aide and example what the aide could assist with, which is bathing and dressing. Patient understood. Asked MD to order [facility] as well. TOC signing off.      Barriers to Discharge: Barriers Resolved  Expected Discharge Plan and Services       Expected Discharge Date: 05/09/23                   Social Determinants of Health (SDOH) Interventions SDOH Screenings   Food Insecurity: No Food Insecurity (05/06/2023)  Housing: Unknown (05/06/2023)  Transportation Needs: No Transportation Needs (05/06/2023)  Utilities: Not At Risk (05/06/2023)  Alcohol Screen: Low Risk  (06/17/2022)  Depression (PHQ2-9): Low Risk  (11/15/2022)  Financial Resource Strain: Low Risk  (06/17/2022)  Physical Activity: Insufficiently Active (06/17/2022)  Social Connections: Moderately Integrated (05/06/2023)  Stress: No Stress Concern Present (06/17/2022)  Tobacco Use: Low Risk  (05/06/2023)  Health Literacy: Low Risk  (07/09/2020)   Received from [facility]    Readmission Risk Interventions    05/09/2023   11:35 AM  Readmission Risk Prevention Plan  Medication Screening Complete  Transportation Screening Complete

## 2023-05-09 NOTE — Telephone Encounter (Signed)
 Attempted to reach patient unsuccessfully.  I left a VM.  I'm not sure how I can help the patient.  I sadly cannot ask another patient to give up their appointment to accommodate her as this would be inappropriate. If management is willing to approve overtime, I'm glad to stay after hours and see her.  However, I do fully support her seeing one of the NPs as I believe them to be fully capable of helping care for her in my absence.

## 2023-05-10 ENCOUNTER — Other Ambulatory Visit (HOSPITAL_COMMUNITY): Payer: Self-pay

## 2023-05-10 ENCOUNTER — Telehealth: Payer: Self-pay | Admitting: *Deleted

## 2023-05-10 ENCOUNTER — Telehealth: Payer: Self-pay

## 2023-05-10 ENCOUNTER — Encounter: Payer: Self-pay | Admitting: *Deleted

## 2023-05-10 NOTE — Telephone Encounter (Signed)
 Copied from CRM [id number]. Topic: Clinical - Home Health Verbal Orders >> May 10, 2023 12:15 PM Deleta HERO wrote: Caller/Agency: [facility] Callback Number: [phone] Service Requested: Physical Therapy to start on 01/13. Frequency: n/a Any new concerns about the patient? No

## 2023-05-10 NOTE — Transitions of Care (Post Inpatient/ED Visit) (Signed)
 05/10/2023  Name: Lori Soto MRN: [id number] DOB: 1935/08/19  Today's TOC FU Call Status: Today's TOC FU Call Status:: Successful TOC FU Call Completed TOC FU Call Complete Date: 05/10/23 Patient's Name and Date of Birth confirmed.  Transition Care Management Follow-up Telephone Call Date of Discharge: 05/09/23 [facility] Type of Discharge: Inpatient Admission Primary Inpatient Discharge Diagnosis:: Pulmonary embolism How have you been since you were released from the hospital?: Better Any questions or concerns?: No  Items Reviewed: Did you receive and understand the discharge instructions provided?: Yes Medications obtained,verified, and reconciled?: Yes (Medications Reviewed) Any new allergies since your discharge?: No Dietary orders reviewed?: Yes Type of Diet Ordered:: heart healthy, low sodium Do you have support at home?: Yes People in Home: child(ren), adult Name of Support/Comfort Primary Source: daughter Lori Soto is spending the night with pt,  pt has friends she can call on Patient consented to participate in 30 day TOC program   Goals Addressed             This Visit's Progress    Transition of Care/ pt will have no readmissions within 30 days       Current Barriers:  Knowledge Deficits related to plan of care for management of pulmonary embolus  Patient lives alone, daughter temporarily staying with patient, home health did contact pt and will be scheduling first home visit  RNCM Clinical Goal(s):  Patient will work with the Care Management team over the next 30 days to address Transition of Care Barriers: Home Health services verbalize understanding of plan for management of pulmonary embolus as evidenced by patient report, review of EMR and  through collaboration with RN Care manager, provider, and care team.   Interventions: Evaluation of current treatment plan related to  self management and patient's adherence to plan  as established by provider   Acute pulmonary embolus  (Status:  New goal. and Goal on track:  Yes.)  Short Term Goal Evaluation of current treatment plan related to  pulmonary embolus ,  working with home health,  self-management and patient's adherence to plan as established by provider. Discussed plans with patient for ongoing care management follow up and provided patient with direct contact information for care management team Evaluation of current treatment plan related to pulmonary embolus and patient's adherence to plan as established by provider Provided education to patient re: pulmonary embolus, reportable signs/ symptoms, importance of med adherence and following up with providers Reviewed medications with patient and discussed importance of taking as prescribed Reviewed scheduled/upcoming provider appointments including primary care provider 05/17/23 @ 1015 am Assessed social determinant of health barriers  Patient Goals/Self-Care Activities: Participate in Transition of Care Program/Attend [facility] scheduled calls Notify RN Care Manager of TOC call rescheduling needs Take all medications as prescribed Attend all scheduled provider appointments Call pharmacy for medication refills 3-7 days in advance of running out of medications Call provider office for new concerns or questions  Please take Eliquis  per dosage instructions reviewed on discharge summary Please follow up with primary care provider 05/17/23 @ 1015 am  Follow Up Plan:  Telephone follow up appointment with care management team member scheduled for:  05/18/23 @ 10 am The patient has been provided with contact information for the care management team and has been advised to call with any health related questions or concerns.           Medications Reviewed Today: Medications Reviewed Today     Reviewed by  Aura Mliss LABOR, RN (Registered Nurse) on 05/10/23 at [phone]  Med List Status: <None>   Medication Order Taking? Sig  Documenting Provider Last Dose Status Informant  Acetaminophen  (TYLENOL  PO) [id number] Yes Take 1 tablet by mouth daily as needed (pain, headache). [provider] Taking Active Self, Pharmacy Records  ALPHA LIPOIC ACID PO [id number] Yes Take 1 capsule by mouth daily. [provider] Taking Active Self, Pharmacy Records  apixaban  (ELIQUIS ) 5 MG TABS tablet [id number] Yes Take 2 tablets (10 mg total) by mouth 2 (two) times daily for 7 days, THEN take 1 tablet (5 mg total) 2 (two) times daily. Jillian Buttery, MD Taking Active   Cyanocobalamin  (VITAMIN B 12 PO) [id number] Yes Take 1 tablet by mouth daily. [provider] Taking Active Self, Pharmacy Records  ferrous sulfate  325 (65 FE) MG tablet [id number] Yes Take 1 tablet (325 mg total) by mouth daily with breakfast. Jillian Buttery, MD Taking Active   [facility] BILOBA PO [id number] Yes Take 1 capsule by mouth daily. [provider] Taking Active Self, Pharmacy Records  Glucosamine-Chondroit-Vit C-Mn [facility]) TABS [id number] Yes Take 1 tablet by mouth daily. [provider] Taking Active Self, Pharmacy Records  LUTEIN PO [id number] Yes Take 1 tablet by mouth daily. [provider] Taking Active Self, Pharmacy Records  Mesalamine  (ASACOL ) 400 MG CPDR DR capsule [id number] Yes TAKE 2 CAPSULES BY MOUTH DAILY  Patient taking differently: Take 400 mg by mouth daily.   Eartha Angelia Sieving, MD Taking Active Self, Pharmacy Records  metoprolol  tartrate (LOPRESSOR ) 25 MG tablet [id number] Yes Take 1 tablet (25 mg total) by mouth 2 (two) times daily. Lavona Agent, MD Taking Active Self, Pharmacy Records  Multiple Vitamins-Minerals (MULTIVITAMIN WOMEN 50+) TABS [id number] Yes Take 1 tablet by mouth daily. [provider] Taking Active Self, Pharmacy Records  Multiple Vitamins-Minerals (ZINC PO) [id number] Yes Take 1 tablet by mouth daily. [provider] Taking Active Self, Pharmacy  Records  Nutritional Supplements (GRAPESEED EXTRACT PO) [id number] Yes Take 1 capsule by mouth daily. [provider] Taking Active Self, Pharmacy Records  OVER THE COUNTER MEDICATION [id number] Yes Take 1 capsule by mouth daily. OTC : CoQ10 + red yeast rice [provider] Taking Active Self, Pharmacy Records  pantoprazole  (PROTONIX ) 40 MG tablet [id number] Yes Take 1 tablet (40 mg total) by mouth daily before breakfast. Jillian Buttery, MD Taking Active   Pyridoxine HCl (VITAMIN B-6 PO) [id number] Yes Take 1 tablet by mouth daily. [provider] Taking Active Self, Pharmacy Records  Sodium Hyaluronate, oral, (HYALURONIC ACID PO) [id number] Yes Take 1 capsule by mouth daily. [provider] Taking Active Self, Pharmacy Records  TURMERIC CURCUMIN PO [id number] Yes Take 1 tablet by mouth 2 (two) times daily. [provider] Taking Active Self, Pharmacy Records  Med List Note (Ward, Angelica, CPhT 04/01/23 2015): Long term meds to [facility] and short term meds to [facility] After first prescription it automatically goes to [facility] per pt            Home Care and Equipment/Supplies: Were Home Health Services Ordered?: Yes [facility]  Has Agency set up a time to come to your home?:  (pt states agency called and will be calling back with exact date of first visit) Any new equipment or medical supplies ordered?: No  Functional Questionnaire: Do you need assistance with bathing/showering or dressing?: No Do you need assistance with meal preparation?: No Do you need assistance with eating?: No  Do you have difficulty maintaining continence: No Do you need assistance with getting out of bed/getting out of a chair/moving?: No Do you have difficulty managing or taking your medications?: No  Follow up appointments reviewed: PCP Follow-up appointment confirmed?: Yes Date of PCP follow-up appointment?: 05/17/23 Follow-up  Provider: Nena Hummer  @ 1015 am Specialist Hospital Follow-up appointment confirmed?:  (pt states she has number to call [facility] for hiatal hernia but will be waiting a few weeks before doing this) Reason Specialist Follow-Up Not Confirmed: Patient has Specialist Provider Number and will Call for Appointment Do you need transportation to your follow-up appointment?: No Do you understand care options if your condition(s) worsen?: Yes-patient verbalized understanding  SDOH Interventions Today    Flowsheet Row Most Recent Value  SDOH Interventions   Food Insecurity Interventions Intervention Not Indicated  Housing Interventions Intervention Not Indicated  Transportation Interventions Intervention Not Indicated  Utilities Interventions Intervention Not Indicated       Mliss Creed [facility], BSN RN Care Manager/ Transition of Care [facility] Population Health [phone]

## 2023-05-11 ENCOUNTER — Telehealth: Payer: Self-pay | Admitting: Cardiology

## 2023-05-11 NOTE — Telephone Encounter (Signed)
 New Message:     Patient wants to be sure that Dr Antoine Poche knows that she was admitted to [facility] on last Saturday(05-06-23) and was discharged on Tuesday (05-09-27). She also wants to make sure he reviews her records from that hospital stay please.

## 2023-05-12 NOTE — Telephone Encounter (Signed)
 Lori Agent, MD  You36 minutes ago (12:36 PM)    Chart reviewed.   No new suggestions   Patient identification verified by 2 forms. [name], RN    Called and spoke to patient  Relayed provider message  Advised patient to follow discharge recommends and follow up with PCP  Patient verbalized understanding, no questions at this time

## 2023-05-15 DIAGNOSIS — M1990 Unspecified osteoarthritis, unspecified site: Secondary | ICD-10-CM | POA: Diagnosis not present

## 2023-05-15 DIAGNOSIS — I471 Supraventricular tachycardia, unspecified: Secondary | ICD-10-CM | POA: Diagnosis not present

## 2023-05-15 DIAGNOSIS — K5190 Ulcerative colitis, unspecified, without complications: Secondary | ICD-10-CM | POA: Diagnosis not present

## 2023-05-15 DIAGNOSIS — Z7901 Long term (current) use of anticoagulants: Secondary | ICD-10-CM | POA: Diagnosis not present

## 2023-05-15 DIAGNOSIS — K2100 Gastro-esophageal reflux disease with esophagitis, without bleeding: Secondary | ICD-10-CM | POA: Diagnosis not present

## 2023-05-15 DIAGNOSIS — I2510 Atherosclerotic heart disease of native coronary artery without angina pectoris: Secondary | ICD-10-CM | POA: Diagnosis not present

## 2023-05-15 DIAGNOSIS — D649 Anemia, unspecified: Secondary | ICD-10-CM | POA: Diagnosis not present

## 2023-05-15 DIAGNOSIS — I10 Essential (primary) hypertension: Secondary | ICD-10-CM | POA: Diagnosis not present

## 2023-05-15 DIAGNOSIS — K449 Diaphragmatic hernia without obstruction or gangrene: Secondary | ICD-10-CM | POA: Diagnosis not present

## 2023-05-15 DIAGNOSIS — I2694 Multiple subsegmental pulmonary emboli without acute cor pulmonale: Secondary | ICD-10-CM | POA: Diagnosis not present

## 2023-05-15 DIAGNOSIS — Z85828 Personal history of other malignant neoplasm of skin: Secondary | ICD-10-CM | POA: Diagnosis not present

## 2023-05-15 DIAGNOSIS — K589 Irritable bowel syndrome without diarrhea: Secondary | ICD-10-CM | POA: Diagnosis not present

## 2023-05-15 NOTE — Progress Notes (Signed)
 Established Patient Office Visit  Subjective  Patient ID: Lori Soto, female    DOB: 03/07/1936  Age: 88 y.o. MRN: [id number]  Chief Complaint  Patient presents with   Hospitalization Follow-up    Last Saturday went to ER for trouble breathing, found blood clots in her lungs  CY angio  HPI Lori Soto is 88 yrs old female seen today 05/17/2023 for hospital discharge follow-up for PE.  SVT, hypertension, UC, GERD. Her daughter was not performed during the encounter, and she had question about patient liver function based on her diagnosis of UC and long term medication.  She had labs done April 01, 2023 and was able to discuss liver function with her daughter.  PE  She she was admitted on 05/06/2023 and discharged on 05/09/2023 The patient is an 88 year old female seen today for follow-up after being discharged from the hospital for a pulmonary embolism (PE). She underwent a CT Angio  was positive for pulmonary  emboli with lobar to segmental emboli bilaterally. and was treated with IV heparin .and ultrasound during her hospitalization. She is currently being managed with Eliquis  5 mg BID. The patient denies shortness of breath (SOB), chest pain, or any other new symptoms. She reports tolerating her medications well.  Patient Active Problem List   Diagnosis Date Noted   Hospital discharge follow-up 05/17/2023   Pulmonary embolism (HCC) 05/06/2023   Murmur 03/07/2023   History of supraventricular tachycardia 11/08/2021   Arthritis 06/13/2019   GERD (gastroesophageal reflux disease) 06/10/2019   SVT (supraventricular tachycardia) (HCC) 08/28/2018   Light headedness 05/09/2018   Carpal tunnel syndrome of right wrist 05/20/2015   Primary localized osteoarthrosis, hand 05/20/2015   Carpal tunnel syndrome on left 03/18/2015   Osteopenia of the elderly 02/19/2014   DDD (degenerative disc disease), lumbar 12/03/2013   Scoliosis 12/03/2013   Chronic low back pain 05/14/2013   IBS  (irritable bowel syndrome) 11/08/2012   Hypertension 10/26/2011   Chronic ulcerative colitis (HCC) 03/23/2011   Past Medical History:  Diagnosis Date   Anemia    Arthritis    Cancer (HCC)    skin cancer on nose   Carpal tunnel syndrome, bilateral    Cataract    Esophagitis    GERD (gastroesophageal reflux disease)    H/O hiatal hernia    Osteopenia    Scoliosis    Shingles    SVT (supraventricular tachycardia) (HCC)    Ulcerative colitis    Past Surgical History:  Procedure Laterality Date   ABDOMINAL HYSTERECTOMY     BUNIONECTOMY     Bilateral   CARPAL TUNNEL RELEASE Left 03/10/2015   Procedure: LEFT CARPAL TUNNEL RELEASE;  Surgeon: Lyanne Sample, MD;  Location: [facility];  Service: Orthopedics;  Laterality: Left;   CARPAL TUNNEL RELEASE Right 10/31/2017   Procedure: RIGHT CARPAL TUNNEL RELEASE;  Surgeon: Lyanne Sample, MD;  Location: [facility];  Service: Orthopedics;  Laterality: Right;   CATARACT EXTRACTION W/PHACO  05/28/2012   Procedure: CATARACT EXTRACTION PHACO AND INTRAOCULAR LENS PLACEMENT (IOC);  Surgeon: Anner Kill, MD;  Location: [facility];  Service: Ophthalmology;  Laterality: Right;  CDE=12.84   CATARACT EXTRACTION W/PHACO  06/07/2012   Procedure: CATARACT EXTRACTION PHACO AND INTRAOCULAR LENS PLACEMENT (IOC);  Surgeon: Anner Kill, MD;  Location: [facility];  Service: Ophthalmology;  Laterality: Left;  CDE 17.60   COLONOSCOPY     COLONOSCOPY N/A 07/08/2015   Procedure: COLONOSCOPY;  Surgeon: Ruby Corporal, MD;  [facility];  Service: Endoscopy;  Laterality: N/A;  930   ESOPHAGEAL DILATION N/A 07/08/2015   Procedure: ESOPHAGEAL DILATION;  Surgeon: Ruby Corporal, MD;  [facility];  Service: Endoscopy;  Laterality: N/A;   ESOPHAGOGASTRODUODENOSCOPY N/A 07/08/2015   Procedure: ESOPHAGOGASTRODUODENOSCOPY (EGD);  Surgeon: Ruby Corporal, MD;  [facility];  Service: Endoscopy;  Laterality: N/A;   Hernia     Right  inguinal   HERNIA REPAIR  1961   right inguinal hernia   UPPER GASTROINTESTINAL ENDOSCOPY     Social History   Tobacco Use   Smoking status: Never   Smokeless tobacco: Never   Tobacco comments:    Never smoker  Vaping Use   Vaping status: Never Used  Substance Use Topics   Alcohol use: Yes    Alcohol/week: 0.0 standard drinks of alcohol    Comment: Very Rarely   Drug use: No   Social History   Socioeconomic History   Marital status: Widowed    Spouse name: Not on file   Number of children: 3   Years of education: Not on file   Highest education level: Bachelor's degree (e.g., BA, AB, BS)  Occupational History    Employer: RETIRED    Comment: accounting  Tobacco Use   Smoking status: Never   Smokeless tobacco: Never   Tobacco comments:    Never smoker  Vaping Use   Vaping status: Never Used  Substance and Sexual Activity   Alcohol use: Yes    Alcohol/week: 0.0 standard drinks of alcohol    Comment: Very Rarely   Drug use: No   Sexual activity: Not Currently    Birth control/protection: Surgical  Other Topics Concern   Not on file  Social History Narrative   Lives alone in 2 story home. Still very independent   Social Drivers of Corporate investment banker Strain: Low Risk  (06/17/2022)   Overall Financial Resource Strain (CARDIA)    Difficulty of Paying Living Expenses: Not hard at all  Food Insecurity: No Food Insecurity (05/10/2023)   Hunger Vital Sign    Worried About Running Out of Food in the Last Year: Never true    Ran Out of Food in the Last Year: Never true  Transportation Needs: No Transportation Needs (05/10/2023)   PRAPARE - Administrator, Civil Service (Medical): No    Lack of Transportation (Non-Medical): No  Physical Activity: Insufficiently Active (06/17/2022)   Exercise Vital Sign    Days of Exercise per Week: 3 days    Minutes of Exercise per Session: 30 min  Stress: No Stress Concern Present (06/17/2022)   [employer] of  Occupational Health - Occupational Stress Questionnaire    Feeling of Stress : Not at all  Social Connections: Moderately Integrated (05/06/2023)   Social Connection and Isolation Panel [NHANES]    Frequency of Communication with Friends and Family: More than three times a week    Frequency of Social Gatherings with Friends and Family: More than three times a week    Attends Religious Services: More than 4 times per year    Active Member of [employer] or Organizations: Yes    Attends Banker Meetings: More than 4 times per year    Marital Status: Widowed  Intimate Partner Violence: Not At Risk (05/10/2023)   Humiliation, Afraid, Rape, and Kick questionnaire    Fear of Current or Ex-Partner: No    Emotionally Abused: No    Physically Abused:  No    Sexually Abused: No   Family Status  Relation Name Status   Mother  Deceased at age 18   Father  Deceased at age 70   Brother Malta Deceased   Sister Modine Alive   Sister Art gallery manager Alive   Sister Patsy Alive   Sister Delores Fester Alive       Back Problems   Sister Eldora Greet   Son Bev. [address]   Daughter Abe Abed Alive   Daughter Connie Deist Alive   MGM  Deceased   MGF  Deceased   PGM  Deceased   PGF  Deceased   Brother baby Deceased at age died after birth  No partnership data on file   Family History  Problem Relation Age of Onset   Arthritis Mother    Heart disease Mother    Cancer Father        pancreat   Pancreatic cancer Father    Arthritis Father        back issues    Cancer Brother        lung and liver   Liver cancer Brother    Lung cancer Brother    Heart disease Sister        Rheumatic Fever   Peptic Ulcer Sister    Diabetes Sister    Colon cancer Sister    Edema Sister    GI problems Sister        diverticulitis   Diabetes Sister    Neuropathy Sister    COPD Sister    Hiatal hernia Sister    GI problems Sister    Allergies  Allergen Reactions   Penicillins Itching, Swelling and Rash   Covid-19 (Mrna)  Vaccine Rash    Told by PCP to never take again.   Influenza Virus Vaccine Rash    Told by PCP to never take again   Tape Itching      Review of Systems  Constitutional:  Negative for chills and fever.  HENT:  Negative for ear pain and sore throat.   Eyes:  Negative for pain.  Respiratory:  Negative for cough, shortness of breath and wheezing.   Cardiovascular:  Negative for chest pain and leg swelling.  Gastrointestinal:  Negative for constipation, diarrhea, nausea and vomiting.  Musculoskeletal:  Negative for falls.  Skin:  Negative for itching and rash.  Neurological:  Negative for dizziness and headaches.  Endo/Heme/Allergies:  Negative for environmental allergies and polydipsia. Does not bruise/bleed easily.  Psychiatric/Behavioral:  The patient does not have insomnia.    Negative unless indicated in HPI   Objective:     BP 133/84   Pulse 74   Temp 97.7 F (36.5 C) (Temporal)   Ht 5' (1.524 m)   Wt 117 lb (53.1 kg)   SpO2 99%   BMI 22.85 kg/m  BP Readings from Last 3 Encounters:  05/17/23 133/84  05/09/23 135/76  04/01/23 128/86   Wt Readings from Last 3 Encounters:  05/17/23 117 lb (53.1 kg)  05/06/23 120 lb (54.4 kg)  04/01/23 122 lb (55.3 kg)      Physical Exam Vitals and nursing note reviewed.  Constitutional:      Appearance: Normal appearance.  HENT:     Head: Normocephalic and atraumatic.     Nose: Nose normal.     Mouth/Throat:     Mouth: Mucous membranes are moist.  Eyes:     General: No scleral icterus.    Extraocular Movements: Extraocular movements intact.  Conjunctiva/sclera: Conjunctivae normal.     Pupils: Pupils are equal, round, and reactive to light.  Neck:     Vascular: No carotid bruit.  Cardiovascular:     Rate and Rhythm: Normal rate and regular rhythm.  Abdominal:     General: Bowel sounds are normal.     Palpations: Abdomen is soft.  Musculoskeletal:        General: Normal range of motion.     Cervical back: Normal  range of motion and neck supple. No rigidity or tenderness.     Right lower leg: No edema.     Left lower leg: No edema.  Lymphadenopathy:     Cervical: No cervical adenopathy.  Skin:    General: Skin is warm and dry.     Findings: No rash.  Neurological:     Mental Status: She is alert and oriented to person, place, and time.  Psychiatric:        Mood and Affect: Mood normal.        Behavior: Behavior normal.        Thought Content: Thought content normal.        Judgment: Judgment normal.    No results found for any visits on 05/17/23.  Last CBC Lab Results  Component Value Date   WBC 6.3 05/09/2023   HGB 10.7 (L) 05/09/2023   HCT 32.6 (L) 05/09/2023   MCV 89.3 05/09/2023   MCH 29.3 05/09/2023   RDW 13.7 05/09/2023   PLT 279 05/09/2023   Last metabolic panel Lab Results  Component Value Date   GLUCOSE 103 (H) 05/07/2023   NA 138 05/07/2023   K 3.8 05/07/2023   CL 103 05/07/2023   CO2 25 05/07/2023   BUN 9 05/07/2023   CREATININE 0.64 05/07/2023   GFRNONAA >60 05/07/2023   CALCIUM  8.9 05/07/2023   PHOS 4.0 05/07/2023   PROT 6.8 04/01/2023   ALBUMIN 3.8 04/01/2023   LABGLOB 2.5 11/11/2022   AGRATIO 1.7 05/31/2022   BILITOT 0.8 04/01/2023   ALKPHOS 34 (L) 04/01/2023   AST 35 04/01/2023   ALT 25 04/01/2023   ANIONGAP 10 05/07/2023   Last lipids Lab Results  Component Value Date   CHOL 172 11/11/2022   HDL 60 11/11/2022   LDLCALC 102 (H) 11/11/2022   TRIG 50 11/11/2022   CHOLHDL 2.9 11/11/2022   Last hemoglobin A1c No results found for: "HGBA1C" Last thyroid  functions Lab Results  Component Value Date   TSH 1.620 05/31/2022   T4TOTAL 7.7 11/29/2013        Assessment & Plan:  Acute pulmonary embolism, unspecified pulmonary embolism type, unspecified whether acute cor pulmonale present [facility])  Hospital discharge follow-up -     Apixaban ; Take 1 tablet (5 mg total) by mouth 2 (two) times daily.  Dispense: 180 tablet; Refill: 0  Lori Soto  88 year old Caucasian female seen today for posthospital discharge follow-up  for PE no acute distress PE: Continue Eliquis  5 mg twice daily refills provided Follow-up as already scheduled close discharge; Surgery, [facility] (General Surgery) in 2 weeks (05/23/2023) [facility], [facility]  for care services  All questions answered Continue healthy lifestyle choices, including diet (rich in fruits, vegetables, and lean proteins, and low in salt and simple carbohydrates) and exercise (at least 30 minutes of moderate physical activity daily).     The above assessment and management plan was discussed with the patient. The patient verbalized understanding of and has agreed to the management plan. Patient is aware  to call the clinic if they develop any new symptoms or if symptoms persist or worsen. Patient is aware when to return to the clinic for a follow-up visit. Patient educated on when it is appropriate to go to the emergency department.  Return for as already scheduled with PCP.    Lori Soto [place] Thompson, DNP [facility] [address] [place], [place] [address] [phone]    Note: This document was prepared by [employer] voice dictation technology and any errors that results from this process are unintentional.

## 2023-05-17 ENCOUNTER — Encounter: Payer: Self-pay | Admitting: Nurse Practitioner

## 2023-05-17 ENCOUNTER — Ambulatory Visit: Payer: Medicare Other | Admitting: Nurse Practitioner

## 2023-05-17 VITALS — BP 133/84 | HR 74 | Temp 97.7°F | Ht 60.0 in | Wt 117.0 lb

## 2023-05-17 DIAGNOSIS — I2699 Other pulmonary embolism without acute cor pulmonale: Secondary | ICD-10-CM | POA: Diagnosis not present

## 2023-05-17 DIAGNOSIS — Z09 Encounter for follow-up examination after completed treatment for conditions other than malignant neoplasm: Secondary | ICD-10-CM | POA: Diagnosis not present

## 2023-05-17 MED ORDER — APIXABAN 5 MG PO TABS: 5.0000 mg | ORAL_TABLET | Freq: Two times a day (BID) | ORAL | 0 refills | Status: DC

## 2023-05-18 ENCOUNTER — Other Ambulatory Visit: Payer: Self-pay | Admitting: *Deleted

## 2023-05-18 ENCOUNTER — Encounter: Payer: Self-pay | Admitting: *Deleted

## 2023-05-18 ENCOUNTER — Other Ambulatory Visit: Payer: Self-pay | Admitting: Cardiology

## 2023-05-18 ENCOUNTER — Ambulatory Visit: Payer: Medicare Other | Admitting: Cardiovascular Disease

## 2023-05-18 NOTE — Patient Outreach (Signed)
Care Management  Transitions of Care Program Transitions of Care Post-discharge week 2   05/18/2023 Name: Lori Soto MRN: [id number] DOB: 10-23-1935  Subjective: Lori Soto is a 88 y.o. year old female who is a primary care patient of Raliegh Ip, DO. The Care Management team Engaged with patient Engaged with patient by telephone to assess and address transitions of care needs.   Consent to Services:  Patient was given information about care management services, agreed to services, and gave verbal consent to participate.   Assessment: Pt saw primary care provider 05/16/22, pt continues taking medications as prescribed, no new concerns reported.         SDOH Interventions    Flowsheet Row Telephone from 05/10/2023 in [facility] Clinical Support from 06/17/2022 in [facility] Clinical Support from 06/16/2021 in [facility]  SDOH Interventions     Food Insecurity Interventions Intervention Not Indicated Intervention Not Indicated Intervention Not Indicated  Housing Interventions Intervention Not Indicated Intervention Not Indicated Intervention Not Indicated  Transportation Interventions Intervention Not Indicated Intervention Not Indicated Intervention Not Indicated  Utilities Interventions Intervention Not Indicated Intervention Not Indicated --  Alcohol Usage Interventions -- Intervention Not Indicated (Score <7) --  Financial Strain Interventions -- Intervention Not Indicated Intervention Not Indicated  Physical Activity Interventions -- Intervention Not Indicated Intervention Not Indicated  Stress Interventions -- Intervention Not Indicated Intervention Not Indicated  Social Connections Interventions -- Intervention Not Indicated Intervention Not Indicated        Goals Addressed             This Visit's Progress    Transition of Care/ pt will have no  readmissions within 30 days       Current Barriers:  Knowledge Deficits related to plan of care for management of pulmonary embolus  Patient lives alone, daughter is no longer staying with patient, home health is working with pt, pt reports she has all medications and taking as prescribed  RNCM Clinical Goal(s):  Patient will work with the Care Management team over the next 30 days to address Transition of Care Barriers: Home Health services verbalize understanding of plan for management of pulmonary embolus as evidenced by patient report, review of EMR and  through collaboration with RN Care manager, provider, and care team.   Interventions: Evaluation of current treatment plan related to  self management and patient's adherence to plan as established by provider Acute pulmonary embolus  (Status:  New goal. and Goal on track:  Yes.)  Short Term Goal Evaluation of current treatment plan related to  pulmonary embolus ,  working with home health,  self-management and patient's adherence to plan as established by provider. Discussed plans with patient for ongoing care management follow up and provided patient with direct contact information for care management Verified home health is working with patient Reviewed signs/ symptoms of bleeding, reportable signs/ symptoms  Patient Goals/Self-Care Activities: Participate in Transition of Care Program/Attend [facility] scheduled calls Notify RN Care Manager of TOC call rescheduling needs Take all medications as prescribed Attend all scheduled provider appointments Call pharmacy for medication refills 3-7 days in advance of running out of medications Call provider office for new concerns or questions  Please take Eliquis per dosage instructions reviewed on discharge summary If you notice any signs/ symptoms of bleeding, please report to primary care provider  Follow Up Plan:  Telephone follow up appointment with care management team member  scheduled for:   05/25/23 @ 10 am The patient has been provided with contact information for the care management team and has been advised to call with any health related questions or concerns.          Plan: Telephone follow up appointment with care management team member scheduled for: 05/25/23 @ 10 am  Irving Shows [facility], BSN RN Care Manager/ Transition of Care [facility] [employer] [facility] [phone]

## 2023-05-18 NOTE — Patient Instructions (Signed)
Visit Information  Thank you for taking time to visit with me today. Please don't hesitate to contact me if I can be of assistance to you before our next scheduled telephone appointment.  Following are the goals we discussed today:   Goals Addressed             This Visit's Progress    Transition of Care/ pt will have no readmissions within 30 days       Current Barriers:  Knowledge Deficits related to plan of care for management of pulmonary embolus  Patient lives alone, daughter is no longer staying with patient, home health is working with pt, pt reports she has all medications and taking as prescribed  RNCM Clinical Goal(s):  Patient will work with the Care Management team over the next 30 days to address Transition of Care Barriers: Home Health services verbalize understanding of plan for management of pulmonary embolus as evidenced by patient report, review of EMR and  through collaboration with RN Care manager, provider, and care team.   Interventions: Evaluation of current treatment plan related to  self management and patient's adherence to plan as established by provider Acute pulmonary embolus  (Status:  New goal. and Goal on track:  Yes.)  Short Term Goal Evaluation of current treatment plan related to  pulmonary embolus ,  working with home health,  self-management and patient's adherence to plan as established by provider. Discussed plans with patient for ongoing care management follow up and provided patient with direct contact information for care management Verified home health is working with patient Reviewed signs/ symptoms of bleeding, reportable signs/ symptoms  Patient Goals/Self-Care Activities: Participate in Transition of Care Program/Attend [facility] scheduled calls Notify RN Care Manager of TOC call rescheduling needs Take all medications as prescribed Attend all scheduled provider appointments Call pharmacy for medication refills 3-7 days in advance of running  out of medications Call provider office for new concerns or questions  Please take Eliquis per dosage instructions reviewed on discharge summary If you notice any signs/ symptoms of bleeding, please report to primary care provider  Follow Up Plan:  Telephone follow up appointment with care management team member scheduled for:  05/25/23 @ 10 am The patient has been provided with contact information for the care management team and has been advised to call with any health related questions or concerns.           Our next appointment is by telephone on 05/25/23 at 10 am  Please call the care guide team at [phone] if you need to cancel or reschedule your appointment.   If you are experiencing a Mental Health or Behavioral Health Crisis or need someone to talk to, please call the Suicide and Crisis Lifeline: 988 call the [place] National Suicide Prevention Lifeline: 3851042832 or TTY: (203)758-1682 TTY 548-737-9082) to talk to a trained counselor call 1-800-273-TALK (toll free, 24 hour hotline) go to [facility] [address], [place] [phone]) call the [facility] Crisis Line: [phone] call 911   Patient verbalizes understanding of instructions and care plan provided today and agrees to view in MyChart. Active MyChart status and patient understanding of how to access instructions and care plan via MyChart confirmed with patient.     Irving Shows [facility], BSN RN Care Manager/ Transition of Care [place]/ [facility] [phone]

## 2023-05-19 DIAGNOSIS — M1990 Unspecified osteoarthritis, unspecified site: Secondary | ICD-10-CM | POA: Diagnosis not present

## 2023-05-19 DIAGNOSIS — K589 Irritable bowel syndrome without diarrhea: Secondary | ICD-10-CM | POA: Diagnosis not present

## 2023-05-19 DIAGNOSIS — I471 Supraventricular tachycardia, unspecified: Secondary | ICD-10-CM | POA: Diagnosis not present

## 2023-05-19 DIAGNOSIS — K5190 Ulcerative colitis, unspecified, without complications: Secondary | ICD-10-CM | POA: Diagnosis not present

## 2023-05-19 DIAGNOSIS — I2694 Multiple subsegmental pulmonary emboli without acute cor pulmonale: Secondary | ICD-10-CM | POA: Diagnosis not present

## 2023-05-19 DIAGNOSIS — D649 Anemia, unspecified: Secondary | ICD-10-CM | POA: Diagnosis not present

## 2023-05-22 ENCOUNTER — Ambulatory Visit (INDEPENDENT_AMBULATORY_CARE_PROVIDER_SITE_OTHER): Payer: Medicare Other

## 2023-05-22 DIAGNOSIS — Z7901 Long term (current) use of anticoagulants: Secondary | ICD-10-CM | POA: Diagnosis not present

## 2023-05-22 DIAGNOSIS — I10 Essential (primary) hypertension: Secondary | ICD-10-CM | POA: Diagnosis not present

## 2023-05-22 DIAGNOSIS — K2100 Gastro-esophageal reflux disease with esophagitis, without bleeding: Secondary | ICD-10-CM | POA: Diagnosis not present

## 2023-05-22 DIAGNOSIS — I2510 Atherosclerotic heart disease of native coronary artery without angina pectoris: Secondary | ICD-10-CM

## 2023-05-22 DIAGNOSIS — M1990 Unspecified osteoarthritis, unspecified site: Secondary | ICD-10-CM | POA: Diagnosis not present

## 2023-05-22 DIAGNOSIS — K5190 Ulcerative colitis, unspecified, without complications: Secondary | ICD-10-CM

## 2023-05-22 DIAGNOSIS — Z85828 Personal history of other malignant neoplasm of skin: Secondary | ICD-10-CM | POA: Diagnosis not present

## 2023-05-22 DIAGNOSIS — D649 Anemia, unspecified: Secondary | ICD-10-CM | POA: Diagnosis not present

## 2023-05-22 DIAGNOSIS — K449 Diaphragmatic hernia without obstruction or gangrene: Secondary | ICD-10-CM | POA: Diagnosis not present

## 2023-05-22 DIAGNOSIS — I471 Supraventricular tachycardia, unspecified: Secondary | ICD-10-CM | POA: Diagnosis not present

## 2023-05-22 DIAGNOSIS — I2694 Multiple subsegmental pulmonary emboli without acute cor pulmonale: Secondary | ICD-10-CM

## 2023-05-23 ENCOUNTER — Telehealth (INDEPENDENT_AMBULATORY_CARE_PROVIDER_SITE_OTHER): Payer: Self-pay | Admitting: *Deleted

## 2023-05-23 NOTE — Telephone Encounter (Signed)
Patient states she only been on 40mg  for about one week and she has plenty right now so she wants to take a little bit longer and then decide if its a difference or not. I told her to call me back when she needed a refill and she verbalizes understanding.

## 2023-05-23 NOTE — Telephone Encounter (Signed)
Did she have any difference in symptoms with 20 vs 40 mg? If no difference I'll refill the 20 mg dosing

## 2023-05-23 NOTE — Telephone Encounter (Signed)
Fax from [facility] requesting refill on pantoprazole. Pt last seen 12/19/22 and note states to continue pantoprazole 20mg  daily. Med was increased at hospital stay on. Discharged on 05/09/23. I called to see which dose patient was taking and she said she was taking 40mg  and I asked if she was having any issues with the increase dose or symptoms and she said no but she said she wasn't before and she thought they increased due to having hiatial hernia. She does not have upcoming appt and wanted to know when she should follow up and needed refill sent with new dose of 40mg  if you agree.   [facility]   [phone]

## 2023-05-24 DIAGNOSIS — I2694 Multiple subsegmental pulmonary emboli without acute cor pulmonale: Secondary | ICD-10-CM | POA: Diagnosis not present

## 2023-05-24 DIAGNOSIS — D649 Anemia, unspecified: Secondary | ICD-10-CM | POA: Diagnosis not present

## 2023-05-24 DIAGNOSIS — M1990 Unspecified osteoarthritis, unspecified site: Secondary | ICD-10-CM | POA: Diagnosis not present

## 2023-05-24 DIAGNOSIS — K5190 Ulcerative colitis, unspecified, without complications: Secondary | ICD-10-CM | POA: Diagnosis not present

## 2023-05-24 DIAGNOSIS — I471 Supraventricular tachycardia, unspecified: Secondary | ICD-10-CM | POA: Diagnosis not present

## 2023-05-24 DIAGNOSIS — K589 Irritable bowel syndrome without diarrhea: Secondary | ICD-10-CM | POA: Diagnosis not present

## 2023-05-25 ENCOUNTER — Other Ambulatory Visit: Payer: Self-pay | Admitting: *Deleted

## 2023-05-25 ENCOUNTER — Inpatient Hospital Stay: Payer: Medicare Other | Admitting: Nurse Practitioner

## 2023-05-26 ENCOUNTER — Ambulatory Visit (INDEPENDENT_AMBULATORY_CARE_PROVIDER_SITE_OTHER): Payer: Medicare Other | Admitting: Family Medicine

## 2023-05-26 ENCOUNTER — Encounter: Payer: Self-pay | Admitting: Family Medicine

## 2023-05-26 ENCOUNTER — Telehealth: Payer: Self-pay | Admitting: Family Medicine

## 2023-05-26 VITALS — BP 132/82 | HR 75 | Ht 60.0 in | Wt 116.0 lb

## 2023-05-26 DIAGNOSIS — I2699 Other pulmonary embolism without acute cor pulmonale: Secondary | ICD-10-CM | POA: Diagnosis not present

## 2023-05-26 NOTE — Progress Notes (Signed)
BP 132/82   Pulse 75   Ht 5' (1.524 m)   Wt 116 lb (52.6 kg)   SpO2 99%   BMI 22.65 kg/m    Subjective:   Patient ID: Lori Soto, female    DOB: 1935/09/14, 88 y.o.   MRN: [id number]  HPI: Lori Soto is a 88 y.o. female presenting on 05/26/2023 for Hospitalization Follow-up (PE)   HPI PE follow-up Patient is coming in for pulmonary embolism follow-up.  She was diagnosed with pulmonary embolism on 05/06/2023 and discharged and then had a follow-up visit with our office on 05/17/2023.  Patient is coming in for hospital follow-up for pulmonary embolism.  She says she is not having any further chest pain and says she is really does not have shortness of breath much except for occasionally when she is exerting herself she feels a little short of breath.  She denies any calf pain or swelling.  She does admit that the July before this pulmonary embolism from about 6 months today took a travel trip for 3 weeks and went to 2 different places in the car including the beach in [place] and other places but they did stop at those places for multiple days and were not just in the car the whole time.  She cannot think of any other recent travel or trips or recent surgeries.  She denies any calf pain or swelling and does not recall having any before either.  She has been on Eliquis and denies any blood in her stool or major bleeding anywhere else.  Relevant past medical, surgical, family and social history reviewed and updated as indicated. Interim medical history since our last visit reviewed. Allergies and medications reviewed and updated.  Review of Systems  Constitutional:  Negative for chills and fever.  HENT:  Negative for congestion, ear discharge and ear pain.   Eyes:  Negative for redness and visual disturbance.  Respiratory:  Negative for chest tightness and shortness of breath.   Cardiovascular:  Negative for chest pain and leg swelling.  Gastrointestinal:  Negative for anal  bleeding and blood in stool.  Genitourinary:  Negative for difficulty urinating, dysuria and hematuria.  Musculoskeletal:  Negative for back pain and gait problem.  Skin:  Negative for rash.  Neurological:  Negative for light-headedness and headaches.  Psychiatric/Behavioral:  Negative for agitation and behavioral problems.   All other systems reviewed and are negative.   Per HPI unless specifically indicated above   Allergies as of 05/26/2023       Reactions   Penicillins Itching, Swelling, Rash   Covid-19 (mrna) Vaccine Rash   Told by PCP to never take again.   Influenza Virus Vaccine Rash   Told by PCP to never take again   Tape Itching        Medication List        Accurate as of May 26, 2023  1:19 PM. If you have any questions, ask your nurse or doctor.          STOP taking these medications    GINKGO BILOBA PO Stopped by: Elige Radon Jenavive Lamboy   Glucosamine-Chondroitin Tabs Stopped by: Elige Radon Jennetta Flood   LUTEIN PO Stopped by: Elige Radon Kiernan Atkerson       TAKE these medications    ALPHA LIPOIC ACID PO Take 1 capsule by mouth daily.   apixaban 5 MG Tabs tablet Commonly known as: ELIQUIS Take 1 tablet (5 mg total) by mouth 2 (two) times daily.  ferrous sulfate 325 (65 FE) MG tablet Take 1 tablet (325 mg total) by mouth daily with breakfast.   GRAPESEED EXTRACT PO Take 1 capsule by mouth daily.   HYALURONIC ACID PO Take 1 capsule by mouth daily.   Mesalamine 400 MG Cpdr DR capsule Commonly known as: ASACOL TAKE 2 CAPSULES BY MOUTH DAILY   metoprolol tartrate 25 MG tablet Commonly known as: LOPRESSOR TAKE 1 TABLET BY MOUTH TWICE  DAILY   Multivitamin Women 50+ Tabs Take 1 tablet by mouth daily.   OVER THE COUNTER MEDICATION Take 1 capsule by mouth daily. OTC : CoQ10 + red yeast rice   pantoprazole 40 MG tablet Commonly known as: PROTONIX Take 1 tablet (40 mg total) by mouth daily before breakfast.   TURMERIC CURCUMIN PO Take 1  tablet by mouth 2 (two) times daily.   TYLENOL PO Take 1 tablet by mouth daily as needed (pain, headache).   VITAMIN B 12 PO Take 1 tablet by mouth daily.   VITAMIN B-6 PO Take 1 tablet by mouth daily.   ZINC PO Take 1 tablet by mouth daily.         Objective:   BP 132/82   Pulse 75   Ht 5' (1.524 m)   Wt 116 lb (52.6 kg)   SpO2 99%   BMI 22.65 kg/m   Wt Readings from Last 3 Encounters:  05/26/23 116 lb (52.6 kg)  05/17/23 117 lb (53.1 kg)  05/06/23 120 lb (54.4 kg)    Physical Exam Vitals and nursing note reviewed.  Constitutional:      General: She is not in acute distress.    Appearance: She is well-developed. She is not diaphoretic.  Eyes:     Conjunctiva/sclera: Conjunctivae normal.  Cardiovascular:     Rate and Rhythm: Normal rate and regular rhythm.     Heart sounds: Normal heart sounds. No murmur heard. Pulmonary:     Effort: Pulmonary effort is normal. No respiratory distress.     Breath sounds: Normal breath sounds. No wheezing.  Musculoskeletal:        General: No swelling or tenderness. Normal range of motion.  Skin:    General: Skin is warm and dry.     Findings: No rash.  Neurological:     Mental Status: She is alert and oriented to person, place, and time.     Coordination: Coordination normal.  Psychiatric:        Behavior: Behavior normal.       Assessment & Plan:   Problem List Items Addressed This Visit       Cardiovascular and Mediastinum   Pulmonary embolism (HCC) - Primary   Relevant Orders   Anemia Profile B   CMP14+EGFR    Recommended that she continue with the Eliquis, they told her 3 to 6 months and she can discuss it further with PCP but I was considering recommending 6 months because it was bilateral pulmonary embolism Follow up plan: Return if symptoms worsen or fail to improve, for 2 months follow-up with PCP for physical and recheck on PE.  Counseling provided for all of the vaccine components Orders Placed  This Encounter  Procedures   Anemia Profile B   CMP14+EGFR    Arville Care, MD [facility] 05/26/2023, 1:19 PM

## 2023-05-27 LAB — ANEMIA PROFILE B
Basophils Absolute: 0 10*3/uL (ref 0.0–0.2)
Basos: 1 %
EOS (ABSOLUTE): 0 10*3/uL (ref 0.0–0.4)
Eos: 1 %
Ferritin: 351 ng/mL — ABNORMAL HIGH (ref 15–150)
Hematocrit: 40.8 % (ref 34.0–46.6)
Hemoglobin: 13.4 g/dL (ref 11.1–15.9)
Immature Grans (Abs): 0 10*3/uL (ref 0.0–0.1)
Immature Granulocytes: 0 %
Iron Saturation: 20 % (ref 15–55)
Iron: 53 ug/dL (ref 27–139)
Lymphocytes Absolute: 1.2 10*3/uL (ref 0.7–3.1)
Lymphs: 25 %
MCH: 29.8 pg (ref 26.6–33.0)
MCHC: 32.8 g/dL (ref 31.5–35.7)
MCV: 91 fL (ref 79–97)
Monocytes Absolute: 0.4 10*3/uL (ref 0.1–0.9)
Monocytes: 8 %
Neutrophils Absolute: 3.2 10*3/uL (ref 1.4–7.0)
Neutrophils: 65 %
Platelets: 325 10*3/uL (ref 150–450)
RBC: 4.5 x10E6/uL (ref 3.77–5.28)
RDW: 13.4 % (ref 11.7–15.4)
Retic Ct Pct: 1.2 % (ref 0.6–2.6)
Total Iron Binding Capacity: 260 ug/dL (ref 250–450)
UIBC: 207 ug/dL (ref 118–369)
WBC: 4.9 10*3/uL (ref 3.4–10.8)

## 2023-05-27 LAB — CMP14+EGFR
ALT: 19 IU/L (ref 0–32)
AST: 25 IU/L (ref 0–40)
Albumin: 3.9 g/dL (ref 3.7–4.7)
Alkaline Phosphatase: 48 IU/L (ref 44–121)
BUN/Creatinine Ratio: 8 — ABNORMAL LOW (ref 12–28)
BUN: 7 mg/dL — ABNORMAL LOW (ref 8–27)
Bilirubin Total: 0.4 mg/dL (ref 0.0–1.2)
CO2: 28 mmol/L (ref 20–29)
Calcium: 9.4 mg/dL (ref 8.7–10.3)
Chloride: 98 mmol/L (ref 96–106)
Creatinine, Ser: 0.84 mg/dL (ref 0.57–1.00)
Globulin, Total: 2.7 g/dL (ref 1.5–4.5)
Glucose: 93 mg/dL (ref 70–99)
Potassium: 4.9 mmol/L (ref 3.5–5.2)
Sodium: 138 mmol/L (ref 134–144)
Total Protein: 6.6 g/dL (ref 6.0–8.5)
eGFR: 68 mL/min/{1.73_m2} (ref >59)

## 2023-05-29 NOTE — Telephone Encounter (Signed)
Pt had apt made by front for 2 month - it was 15 mins so can not be a physical I made her a apt on 04/01 for physical   Left vm for confirm change if pt calls back please confirm apt

## 2023-06-02 ENCOUNTER — Telehealth: Payer: Self-pay | Admitting: Family Medicine

## 2023-06-02 DIAGNOSIS — D649 Anemia, unspecified: Secondary | ICD-10-CM | POA: Diagnosis not present

## 2023-06-02 DIAGNOSIS — I471 Supraventricular tachycardia, unspecified: Secondary | ICD-10-CM | POA: Diagnosis not present

## 2023-06-02 DIAGNOSIS — K5190 Ulcerative colitis, unspecified, without complications: Secondary | ICD-10-CM | POA: Diagnosis not present

## 2023-06-02 DIAGNOSIS — M1990 Unspecified osteoarthritis, unspecified site: Secondary | ICD-10-CM | POA: Diagnosis not present

## 2023-06-02 DIAGNOSIS — K589 Irritable bowel syndrome without diarrhea: Secondary | ICD-10-CM | POA: Diagnosis not present

## 2023-06-02 DIAGNOSIS — I2694 Multiple subsegmental pulmonary emboli without acute cor pulmonale: Secondary | ICD-10-CM | POA: Diagnosis not present

## 2023-06-02 NOTE — Telephone Encounter (Unsigned)
Copied from CRM [id number]. Topic: Clinical - Lab/Test Results >> Jun 02, 2023  4:14 PM Desma Mcgregor wrote: Reason for CRM: Patient highly upset that no one has called her and discussed her blood work since the appt last Friday. I explained that there are no notes yet from the doctor. Please follow up asap

## 2023-06-05 ENCOUNTER — Encounter: Payer: Self-pay | Admitting: Family Medicine

## 2023-06-05 NOTE — Telephone Encounter (Signed)
See result note, everything looks good

## 2023-06-07 DIAGNOSIS — I2694 Multiple subsegmental pulmonary emboli without acute cor pulmonale: Secondary | ICD-10-CM | POA: Diagnosis not present

## 2023-06-07 DIAGNOSIS — M1990 Unspecified osteoarthritis, unspecified site: Secondary | ICD-10-CM | POA: Diagnosis not present

## 2023-06-07 DIAGNOSIS — K5190 Ulcerative colitis, unspecified, without complications: Secondary | ICD-10-CM | POA: Diagnosis not present

## 2023-06-07 DIAGNOSIS — K589 Irritable bowel syndrome without diarrhea: Secondary | ICD-10-CM | POA: Diagnosis not present

## 2023-06-07 DIAGNOSIS — I471 Supraventricular tachycardia, unspecified: Secondary | ICD-10-CM | POA: Diagnosis not present

## 2023-06-07 DIAGNOSIS — D649 Anemia, unspecified: Secondary | ICD-10-CM | POA: Diagnosis not present

## 2023-06-12 DIAGNOSIS — I471 Supraventricular tachycardia, unspecified: Secondary | ICD-10-CM | POA: Diagnosis not present

## 2023-06-12 DIAGNOSIS — I2694 Multiple subsegmental pulmonary emboli without acute cor pulmonale: Secondary | ICD-10-CM | POA: Diagnosis not present

## 2023-06-12 DIAGNOSIS — K5190 Ulcerative colitis, unspecified, without complications: Secondary | ICD-10-CM | POA: Diagnosis not present

## 2023-06-12 DIAGNOSIS — D649 Anemia, unspecified: Secondary | ICD-10-CM | POA: Diagnosis not present

## 2023-06-12 DIAGNOSIS — M1990 Unspecified osteoarthritis, unspecified site: Secondary | ICD-10-CM | POA: Diagnosis not present

## 2023-06-12 DIAGNOSIS — K589 Irritable bowel syndrome without diarrhea: Secondary | ICD-10-CM | POA: Diagnosis not present

## 2023-06-14 ENCOUNTER — Telehealth: Payer: Self-pay

## 2023-06-14 NOTE — Telephone Encounter (Signed)
Copied from CRM [id number]. Topic: Clinical - Medication Question >> Jun 14, 2023 12:23 PM Antwanette L wrote: Reason for CRM: Abby Hawkins([facility]) office called in because the patient was having a tooth extracted but the patient is currently taking apixaban (ELIQUIS) 5 MG TABS tablet. The patient cannot have her tooth removed while being on this medication. The office is requesting a callback to get the next steps. The doctor name is Nolon Lennert and he can be reached at [phone]

## 2023-06-14 NOTE — Telephone Encounter (Signed)
Spoke with the dental office.  I do not recommend that she be taken off of her anticoagulant as she just had a pulmonary embolism last month.  She is still considered high risk and needs the medication at this time.  Since the procedure is a simple extraction, according to the [employer] Association, she really should not be taken off of anticoagulation in general unless they are unable to control localized bleeding.

## 2023-06-16 ENCOUNTER — Other Ambulatory Visit (INDEPENDENT_AMBULATORY_CARE_PROVIDER_SITE_OTHER): Payer: Self-pay | Admitting: Gastroenterology

## 2023-06-16 ENCOUNTER — Other Ambulatory Visit: Payer: Self-pay | Admitting: Family Medicine

## 2023-06-16 DIAGNOSIS — Z09 Encounter for follow-up examination after completed treatment for conditions other than malignant neoplasm: Secondary | ICD-10-CM

## 2023-06-16 MED ORDER — APIXABAN 5 MG PO TABS: 5.0000 mg | ORAL_TABLET | Freq: Two times a day (BID) | ORAL | 0 refills | Status: DC

## 2023-06-16 NOTE — Telephone Encounter (Signed)
Copied from CRM [id number]. Topic: Clinical - Medication Refill >> Jun 16, 2023 12:21 PM Prudencio Pair wrote: Most Recent Primary Care Visit:  Provider: Arville Care A  Department: [facility]  Visit Type: HOSPITAL FOLLOW UP  Date: 05/26/2023  Medication: apixaban (ELIQUIS) 5 MG TABS tablet   Has the patient contacted their pharmacy? Yes (Agent: If no, request that the patient contact the pharmacy for the refill. If patient does not wish to contact the pharmacy document the reason why and proceed with request.) (Agent: If yes, when and what did the pharmacy advise?)  Is this the correct pharmacy for this prescription? Yes If no, delete pharmacy and type the correct one.  This is the patient's preferred pharmacy:   [facility] - [place], [place] - [address] [place] [address] Phone: [phone] Fax: [phone]   Has the prescription been filled recently? Yes  Is the patient out of the medication? Yes  Has the patient been seen for an appointment in the last year OR does the patient have an upcoming appointment? Yes  Can we respond through MyChart? No  Agent: Please be advised that Rx refills may take up to 3 business days. We ask that you follow-up with your pharmacy.

## 2023-06-19 ENCOUNTER — Telehealth: Payer: Self-pay | Admitting: Family Medicine

## 2023-06-19 NOTE — Telephone Encounter (Signed)
Spoke to pt regarding labs. LS

## 2023-06-19 NOTE — Telephone Encounter (Signed)
Patient had another Doctor prescribe 40 mg on 05/10/2023. Last ov here was told to take 20 mg

## 2023-06-20 ENCOUNTER — Ambulatory Visit (INDEPENDENT_AMBULATORY_CARE_PROVIDER_SITE_OTHER): Payer: Medicare Other

## 2023-06-20 VITALS — Ht 60.0 in | Wt 116.0 lb

## 2023-06-20 DIAGNOSIS — Z0000 Encounter for general adult medical examination without abnormal findings: Secondary | ICD-10-CM
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
## 2023-06-20 NOTE — Patient Instructions (Addendum)
Lori Soto , Thank you for taking time to come for your Medicare Wellness Visit. I appreciate your ongoing commitment to your health goals. Please review the following plan we discussed and let me know if I can assist you in the future.   Referrals/Orders/Follow-Ups/Clinician Recommendations: Aim for 30 minutes of exercise or brisk walking, 6-8 glasses of water, and 5 servings of fruits and vegetables each day.  This is a list of the screening recommended for you and due dates:  Health Maintenance  Topic Date Due   Zoster (Shingles) Vaccine (1 of 2) Never done   Medicare Annual Wellness Visit  06/19/2024   DEXA scan (bone density measurement)  11/23/2024   DTaP/Tdap/Td vaccine (3 - Td or Tdap) 01/22/2025   Pneumonia Vaccine  Completed   HPV Vaccine  Aged Out   Flu Shot  Discontinued   COVID-19 Vaccine  Discontinued    Advanced directives: (ACP Link)Information on Advanced Care Planning can be found at [facility] [place] Advance Health Care Directives Advance Health Care Directives ([email or web address]   Next Medicare Annual Wellness Visit scheduled for next year: Yes

## 2023-06-21 ENCOUNTER — Other Ambulatory Visit: Payer: Self-pay | Admitting: Family Medicine

## 2023-06-21 ENCOUNTER — Telehealth: Payer: Self-pay

## 2023-06-21 DIAGNOSIS — I10 Essential (primary) hypertension: Secondary | ICD-10-CM

## 2023-06-21 DIAGNOSIS — E559 Vitamin D deficiency, unspecified: Secondary | ICD-10-CM

## 2023-06-21 DIAGNOSIS — D500 Iron deficiency anemia secondary to blood loss (chronic): Secondary | ICD-10-CM

## 2023-06-21 DIAGNOSIS — E782 Mixed hyperlipidemia: Secondary | ICD-10-CM

## 2023-06-21 NOTE — Telephone Encounter (Signed)
Patient seen for AWV and is concerned about the need for labs.  Is asking if labs can be ordered to be done before next appointment and if lab appointment can be scheduled.

## 2023-06-21 NOTE — Telephone Encounter (Signed)
Fasting labs placed.

## 2023-06-22 NOTE — Telephone Encounter (Signed)
 Copied from CRM [id number]. Topic: Clinical - Medical Advice >> Jun 22, 2023 11:33 AM Adelina Mings wrote: Reason for CRM: patient needs a call back from courtney [phone]

## 2023-06-29 ENCOUNTER — Other Ambulatory Visit: Payer: Medicare Other

## 2023-06-29 DIAGNOSIS — I10 Essential (primary) hypertension: Secondary | ICD-10-CM

## 2023-06-29 DIAGNOSIS — E559 Vitamin D deficiency, unspecified: Secondary | ICD-10-CM | POA: Diagnosis not present

## 2023-06-29 DIAGNOSIS — D500 Iron deficiency anemia secondary to blood loss (chronic): Secondary | ICD-10-CM | POA: Diagnosis not present

## 2023-06-29 DIAGNOSIS — E782 Mixed hyperlipidemia: Secondary | ICD-10-CM | POA: Diagnosis not present

## 2023-06-30 ENCOUNTER — Encounter: Payer: Self-pay | Admitting: Family Medicine

## 2023-06-30 LAB — CMP14+EGFR
ALT: 19 [IU]/L (ref 0–32)
AST: 25 [IU]/L (ref 0–40)
Albumin: 4.2 g/dL (ref 3.7–4.7)
Alkaline Phosphatase: 45 [IU]/L (ref 44–121)
BUN/Creatinine Ratio: 15 (ref 12–28)
BUN: 11 mg/dL (ref 8–27)
Bilirubin Total: 0.6 mg/dL (ref 0.0–1.2)
CO2: 25 mmol/L (ref 20–29)
Calcium: 9.6 mg/dL (ref 8.7–10.3)
Chloride: 100 mmol/L (ref 96–106)
Creatinine, Ser: 0.75 mg/dL (ref 0.57–1.00)
Globulin, Total: 2.7 g/dL (ref 1.5–4.5)
Glucose: 91 mg/dL (ref 70–99)
Potassium: 4.8 mmol/L (ref 3.5–5.2)
Sodium: 144 mmol/L (ref 134–144)
Total Protein: 6.9 g/dL (ref 6.0–8.5)
eGFR: 78 mL/min/{1.73_m2} (ref >59)

## 2023-06-30 LAB — CBC
Hematocrit: 42.1 % (ref 34.0–46.6)
Hemoglobin: 13.6 g/dL (ref 11.1–15.9)
MCH: 29.2 pg (ref 26.6–33.0)
MCHC: 32.3 g/dL (ref 31.5–35.7)
MCV: 90 fL (ref 79–97)
Platelets: 259 10*3/uL (ref 150–450)
RBC: 4.66 x10E6/uL (ref 3.77–5.28)
RDW: 14.1 % (ref 11.7–15.4)
WBC: 5.5 10*3/uL (ref 3.4–10.8)

## 2023-06-30 LAB — LIPID PANEL
Chol/HDL Ratio: 2.8 {ratio} (ref 0.0–4.4)
Cholesterol, Total: 202 mg/dL — ABNORMAL HIGH (ref 100–199)
HDL: 72 mg/dL (ref >39)
LDL Chol Calc (NIH): 119 mg/dL — ABNORMAL HIGH (ref 0–99)
Triglycerides: 58 mg/dL (ref 0–149)
VLDL Cholesterol Cal: 11 mg/dL (ref 5–40)

## 2023-06-30 LAB — IRON,TIBC AND FERRITIN PANEL
Ferritin: 192 ng/mL — ABNORMAL HIGH (ref 15–150)
Iron Saturation: 35 % (ref 15–55)
Iron: 104 ug/dL (ref 27–139)
Total Iron Binding Capacity: 295 ug/dL (ref 250–450)
UIBC: 191 ug/dL (ref 118–369)

## 2023-06-30 LAB — VITAMIN D 25 HYDROXY (VIT D DEFICIENCY, FRACTURES): Vit D, 25-Hydroxy: 67.6 ng/mL (ref 30.0–100.0)

## 2023-07-06 ENCOUNTER — Other Ambulatory Visit: Payer: Self-pay | Admitting: Family Medicine

## 2023-07-06 DIAGNOSIS — Z09 Encounter for follow-up examination after completed treatment for conditions other than malignant neoplasm: Secondary | ICD-10-CM

## 2023-07-06 NOTE — Telephone Encounter (Signed)
 Copied from CRM [id number]. Topic: Clinical - Medication Refill >> Jul 06, 2023  9:14 AM Izetta Dakin wrote: Most Recent Primary Care Visit:  Provider: [facility]  Department: [facility]  Visit Type: LAB VISIT  Date: 06/29/2023  Medication: apixaban (ELIQUIS) 5 MG TABS tablet  Has the patient contacted their pharmacy? Yes (Agent: If no, request that the patient contact the pharmacy for the refill. If patient does not wish to contact the pharmacy document the reason why and proceed with request.) (Agent: If yes, when and what did the pharmacy advise?)  Is this the correct pharmacy for this prescription? Yes If no, delete pharmacy and type the correct one.  This is the patient's preferred pharmacy:   [facility] [place] [place] - [address] [place] [address] Phone: [phone] Fax: [phone]   Has the prescription been filled recently? No  Is the patient out of the medication? Yes  Has the patient been seen for an appointment in the last year OR does the patient have an upcoming appointment? Yes  Can we respond through MyChart? No  Agent: Please be advised that Rx refills may take up to 3 business days. We ask that you follow-up with your pharmacy.

## 2023-07-07 ENCOUNTER — Other Ambulatory Visit: Payer: Self-pay | Admitting: Family Medicine

## 2023-07-07 DIAGNOSIS — Z09 Encounter for follow-up examination after completed treatment for conditions other than malignant neoplasm: Secondary | ICD-10-CM

## 2023-07-07 MED ORDER — APIXABAN 5 MG PO TABS: 5.0000 mg | ORAL_TABLET | Freq: Two times a day (BID) | ORAL | 1 refills | Status: DC

## 2023-07-10 ENCOUNTER — Emergency Department (HOSPITAL_COMMUNITY)

## 2023-07-10 ENCOUNTER — Emergency Department (HOSPITAL_COMMUNITY)
Admission: EM | Admit: 2023-07-10 | Discharge: 2023-07-10 | Disposition: A | Discharge status: Home / Self Care | Attending: Emergency Medicine | Admitting: Emergency Medicine

## 2023-07-10 ENCOUNTER — Encounter (HOSPITAL_COMMUNITY): Payer: Self-pay

## 2023-07-10 ENCOUNTER — Other Ambulatory Visit: Payer: Self-pay

## 2023-07-10 DIAGNOSIS — R0602 Shortness of breath: Secondary | ICD-10-CM | POA: Insufficient documentation

## 2023-07-10 DIAGNOSIS — Z86711 Personal history of pulmonary embolism: Secondary | ICD-10-CM | POA: Diagnosis not present

## 2023-07-10 DIAGNOSIS — I471 Supraventricular tachycardia, unspecified: Secondary | ICD-10-CM | POA: Diagnosis not present

## 2023-07-10 DIAGNOSIS — K449 Diaphragmatic hernia without obstruction or gangrene: Secondary | ICD-10-CM | POA: Diagnosis not present

## 2023-07-10 DIAGNOSIS — R000 Tachycardia, unspecified: Secondary | ICD-10-CM | POA: Diagnosis present

## 2023-07-10 DIAGNOSIS — Z7901 Long term (current) use of anticoagulants: Secondary | ICD-10-CM | POA: Insufficient documentation

## 2023-07-10 LAB — CBC WITH DIFFERENTIAL/PLATELET
Abs Immature Granulocytes: 0.01 10*3/uL (ref 0.00–0.07)
Basophils Absolute: 0 10*3/uL (ref 0.0–0.1)
Basophils Relative: 1 %
Eosinophils Absolute: 0 10*3/uL (ref 0.0–0.5)
Eosinophils Relative: 1 %
HCT: 44.9 % (ref 36.0–46.0)
Hemoglobin: 14.6 g/dL (ref 12.0–15.0)
Immature Granulocytes: 0 %
Lymphocytes Relative: 20 %
Lymphs Abs: 1 10*3/uL (ref 0.7–4.0)
MCH: 29.4 pg (ref 26.0–34.0)
MCHC: 32.5 g/dL (ref 30.0–36.0)
MCV: 90.5 fL (ref 80.0–100.0)
Monocytes Absolute: 0.4 10*3/uL (ref 0.1–1.0)
Monocytes Relative: 8 %
Neutro Abs: 3.5 10*3/uL (ref 1.7–7.7)
Neutrophils Relative %: 70 %
Platelets: 243 10*3/uL (ref 150–400)
RBC: 4.96 MIL/uL (ref 3.87–5.11)
RDW: 14.8 % (ref 11.5–15.5)
WBC: 5 10*3/uL (ref 4.0–10.5)
nRBC: 0 % (ref 0.0–0.2)

## 2023-07-10 LAB — BASIC METABOLIC PANEL
Anion gap: 11 (ref 5–15)
BUN: 10 mg/dL (ref 8–23)
CO2: 27 mmol/L (ref 22–32)
Calcium: 9.4 mg/dL (ref 8.9–10.3)
Chloride: 102 mmol/L (ref 98–111)
Creatinine, Ser: 0.75 mg/dL (ref 0.44–1.00)
GFR, Estimated: >60 mL/min (ref >60)
Glucose, Bld: 125 mg/dL — ABNORMAL HIGH (ref 70–99)
Potassium: 4.1 mmol/L (ref 3.5–5.1)
Sodium: 140 mmol/L (ref 135–145)

## 2023-07-10 LAB — TROPONIN I (HIGH SENSITIVITY): Troponin I (High Sensitivity): 10 ng/L (ref <18)

## 2023-07-10 MED ORDER — ADENOSINE 6 MG/2ML IV SOLN
6.0000 mg | Freq: Once | INTRAVENOUS | Status: AC
  Administered 2023-07-10: 6 mg via INTRAVENOUS
  Filled 2023-07-10: qty 2

## 2023-07-10 NOTE — Discharge Instructions (Addendum)
 Be sure to follow-up with your cardiologist via telephone for appropriate ongoing outpatient care.  Return here for concerning changes in your condition.

## 2023-07-10 NOTE — ED Triage Notes (Signed)
 Pt arrived via POV from home c/o tachycardia. Pt reports this began this morning. Pt reports taking metoprolol w/o relief. Pts HR in Triage 154 BPM. Pt denies Cp. Denis SOB.

## 2023-07-10 NOTE — ED Provider Notes (Signed)
 [facility] [facility]GENC[facility] DEPARTMENT AT [facility] Provider Note   CSN: [id number] Arrival date & time: 07/10/23  5621     History  Chief Complaint  Patient presents with   Tachycardia    Lori Soto is a 88 y.o. female.  HPI Presents with her sister patient has a history of SVT, has required interventions previously.  She was in usual state of health until 4 hours ago.  She awoke, felt tachycardic, uncomfortable.  She performed half of her typical intervention, as advised by cardiology, took labetalol.  She felt some improvement in her generalized symptoms, and reduction in her palpitations, but not complete resolution.  She now presents for evaluation. No dyspnea, no syncope, no fall, nor other recent change in activity.  Patient did recently start anticoagulation due to a recently diagnosed pulmonary embolism.    Home Medications Prior to Admission medications   Medication Sig Start Date End Date Taking? Authorizing Provider  Acetaminophen (TYLENOL PO) Take 1 tablet by mouth daily as needed (pain, headache).    [provider]  ALPHA LIPOIC ACID PO Take 1 capsule by mouth daily.    [provider]  apixaban (ELIQUIS) 5 MG TABS tablet Take 1 tablet (5 mg total) by mouth 2 (two) times daily. 07/07/23   Raliegh Ip, DO  Cyanocobalamin (VITAMIN B 12 PO) Take 1 tablet by mouth daily.    [provider]  ferrous sulfate 325 (65 FE) MG tablet Take 1 tablet (325 mg total) by mouth daily with breakfast. 05/10/23   Burnadette Pop, MD  Mesalamine (ASACOL) 400 MG CPDR DR capsule TAKE 2 CAPSULES BY MOUTH DAILY 02/01/23   [name], [name], MD  metoprolol tartrate (LOPRESSOR) 25 MG tablet TAKE 1 TABLET BY MOUTH TWICE  DAILY 05/19/23   [name], MD  Multiple Vitamins-Minerals (MULTIVITAMIN WOMEN 50+) TABS Take 1 tablet by mouth daily.    [provider]  Multiple Vitamins-Minerals (ZINC PO) Take 1 tablet by mouth daily.     [provider]  Nutritional Supplements (GRAPESEED EXTRACT PO) Take 1 capsule by mouth daily.    [provider]  OVER THE COUNTER MEDICATION Take 1 capsule by mouth daily. OTC : CoQ10 + red yeast rice    [provider]  pantoprazole (PROTONIX) 40 MG tablet Take 1 tablet (40 mg total) by mouth daily before breakfast. 05/10/23   Burnadette Pop, MD  Pyridoxine HCl (VITAMIN B-6 PO) Take 1 tablet by mouth daily.    [provider]  Sodium Hyaluronate, oral, (HYALURONIC ACID PO) Take 1 capsule by mouth daily.    [provider]  TURMERIC CURCUMIN PO Take 1 tablet by mouth 2 (two) times daily.    [provider]      Allergies    Penicillins, Covid-19 (mrna) vaccine, Influenza virus vaccine, and Tape    Review of Systems   Review of Systems  Physical Exam Updated Vital Signs BP 118/70   Pulse 67   Temp 98 F (36.7 C) (Oral)   Resp 13   Ht 5' (1.524 m)   Wt 52.6 kg   SpO2 95%   BMI 22.65 kg/m  Physical Exam Vitals and nursing note reviewed.  Constitutional:      General: She is not in acute distress.    Appearance: She is well-developed.  HENT:     Head: Normocephalic and atraumatic.  Eyes:     Conjunctiva/sclera: Conjunctivae normal.  Cardiovascular:     Rate and Rhythm:  Regular rhythm. Tachycardia present.  Pulmonary:     Effort: Pulmonary effort is normal. No respiratory distress.     Breath sounds: Normal breath sounds. No stridor.  Abdominal:     General: There is no distension.  Skin:    General: Skin is warm and dry.  Neurological:     Mental Status: She is alert and oriented to person, place, and time.     Cranial Nerves: No cranial nerve deficit.  Psychiatric:        Mood and Affect: Mood normal.     ED Results / Procedures / Treatments   Labs (all labs ordered are listed, but only abnormal results are displayed) Labs Reviewed  BASIC METABOLIC PANEL - Abnormal; Notable for the following components:       Result Value   Glucose, Bld 125 (*)    All other components within normal limits  CBC WITH DIFFERENTIAL/PLATELET  TROPONIN I (HIGH SENSITIVITY)    EKG EKG Interpretation Date/Time:  Monday July 10 2023 10:36:31 EDT Ventricular Rate:  147 PR Interval:    QRS Duration:  121 QT Interval:  300 QTC Calculation: 470 R Axis:   54  Text Interpretation: Supraventricular tachycardia Confirmed by Gerhard Munch [phone]) on 07/10/2023 12:16:46 PM  Radiology DG Chest 2 View Result Date: 07/10/2023 CLINICAL DATA:  Shortness of breath. EXAM: CHEST - 2 VIEW COMPARISON:  05/06/2023. FINDINGS: Bilateral lung fields are grossly clear. No pulmonary edema. No large pleural effusion or pneumothorax. Stable cardio-mediastinal silhouette. Redemonstration of large diaphragmatic hernias. No acute osseous abnormalities. The soft tissues are within normal limits. IMPRESSION: No active cardiopulmonary disease. Electronically Signed   By: Jules Schick M.D.   On: 07/10/2023 12:15    Procedures .Cardioversion  Date/Time: 07/10/2023 1:17 PM  Performed by: Gerhard Munch, MD Authorized by: Gerhard Munch, MD   Consent:    Consent obtained:  Verbal   Consent given by:  Patient   Risks discussed:  Induced arrhythmia and pain   Alternatives discussed:  Rate-control medication and delayed treatment Pre-procedure details:    Cardioversion basis:  Emergent   Rhythm:  Supraventricular tachycardia Patient sedated: No Attempt one:    Cardioversion mode attempt one: adenosine cardioversion.   Shock outcome:  Conversion to normal sinus rhythm Post-procedure details:    Patient status:  Awake   Patient tolerance of procedure:  Tolerated well, no immediate complications     Medications Ordered in ED Medications  adenosine (ADENOCARD) 6 MG/2ML injection 6 mg (6 mg Intravenous Given 07/10/23 1235)    ED Course/ Medical Decision Making/ A&P                                 Medical Decision Making Patient  with history of SVT presents with palpitations, lightheadedness, concern for SVT versus other arrhythmia, consideration of dehydration, infection, new medications considered as well. Cardiac 150s, SVT abnormal Pulse ox 100% room air normal  Amount and/or Complexity of Data Reviewed Independent Historian:     Details: Sister at bedside External Data Reviewed: notes. Labs: ordered. Decision-making details documented in ED Course. Radiology: ordered. ECG/medicine tests: ordered and independent interpretation performed. Decision-making details documented in ED Course.  Risk Prescription drug management. Decision regarding hospitalization. Diagnosis or treatment significantly limited by social determinants of health.   1:20 PM Following cardioversion with adenosine patient has had no complications, reviewed findings thus far, importance of following up with her cardiologist.  Without evidence  for acute other pathology beyond SVT, now following cardioversion with adenosine, patient discharged in stable condition with her sister.         Final Clinical Impression(s) / ED Diagnoses Final diagnoses:  SVT (supraventricular tachycardia) (HCC)  CRITICAL CARE Performed by: Gerhard Munch Total critical care time: 35 minutes Critical care time was exclusive of separately billable procedures and treating other patients. Critical care was necessary to treat or prevent imminent or life-threatening deterioration. Critical care was time spent personally by me on the following activities: development of treatment plan with patient and/or surrogate as well as nursing, discussions with consultants, evaluation of patient's response to treatment, examination of patient, obtaining history from patient or surrogate, ordering and performing treatments and interventions, ordering and review of laboratory studies, ordering and review of radiographic studies, pulse oximetry and re-evaluation of patient's  condition.    Gerhard Munch, MD 07/10/23 1320

## 2023-07-24 ENCOUNTER — Ambulatory Visit: Payer: Medicare Other | Admitting: Family Medicine

## 2023-08-01 ENCOUNTER — Encounter: Payer: Self-pay | Admitting: Family Medicine

## 2023-08-01 ENCOUNTER — Ambulatory Visit (INDEPENDENT_AMBULATORY_CARE_PROVIDER_SITE_OTHER): Payer: Medicare Other | Admitting: Family Medicine

## 2023-08-01 VITALS — BP 133/75 | HR 61 | Temp 98.7°F | Ht 60.0 in | Wt 120.6 lb

## 2023-08-01 DIAGNOSIS — I2699 Other pulmonary embolism without acute cor pulmonale: Secondary | ICD-10-CM | POA: Diagnosis not present

## 2023-08-01 DIAGNOSIS — I471 Supraventricular tachycardia, unspecified: Secondary | ICD-10-CM | POA: Diagnosis not present

## 2023-08-01 DIAGNOSIS — E559 Vitamin D deficiency, unspecified: Secondary | ICD-10-CM | POA: Diagnosis not present

## 2023-08-01 DIAGNOSIS — D500 Iron deficiency anemia secondary to blood loss (chronic): Secondary | ICD-10-CM | POA: Diagnosis not present

## 2023-08-01 DIAGNOSIS — I10 Essential (primary) hypertension: Secondary | ICD-10-CM

## 2023-08-01 DIAGNOSIS — M4125 Other idiopathic scoliosis, thoracolumbar region: Secondary | ICD-10-CM

## 2023-08-01 DIAGNOSIS — E782 Mixed hyperlipidemia: Secondary | ICD-10-CM

## 2023-08-01 MED ORDER — METOPROLOL TARTRATE 25 MG PO TABS: 25.0000 mg | ORAL_TABLET | Freq: Two times a day (BID) | ORAL | 3 refills | Status: DC

## 2023-08-01 NOTE — Progress Notes (Signed)
 Lori Soto is a 88 y.o. female presents to office today for annual physical exam examination.    Concerns today include: 1.  Since her last visit with me she developed a pulmonary embolism and subsequent iron deficiency anemia which on last lab draw was shown to have corrected itself.  However, she was seen on March 10 in the ER for SVT that had not resolved with her home labetalol.  She has an appointment with her cardiologist next week.  She reports that she has had no issues with bleeding on the Eliquis.  She still has not regained her energy back and she is not sure if this is related to the pulmonary embolism or if this is due to use of Eliquis.   2.  Scoliosis She reports that she has been having worsening posture.  Scoliosis seems to be getting worse.  She has been having some leg pain on the left side that radiates from her left hip down to about the knee.  It is described as burning.  It is intermittent.  She feels like she has a leg length discrepancy as well that is been worsening her mobility  Occupation: Retired  Scientist, forensic Due  Topic Date Due   Zoster Vaccines- Shingrix (1 of 2) Never done   Refills needed today: All  Immunization History  Administered Date(s) Administered   Pneumococcal Conjugate-13 02/26/2014   Pneumococcal Polysaccharide-23 03/20/2012   Td 01/23/2015   Tdap 01/23/2015   Past Medical History:  Diagnosis Date   Anemia    Arthritis    Cancer (HCC)    skin cancer on nose   Carpal tunnel syndrome, bilateral    Cataract    Esophagitis    GERD (gastroesophageal reflux disease)    H/O hiatal hernia    Osteopenia    Scoliosis    Shingles    SVT (supraventricular tachycardia) (HCC)    Ulcerative colitis    Social History   Socioeconomic History   Marital status: Widowed    Spouse name: Not on file   Number of children: 3   Years of education: Not on file   Highest education level: Bachelor's degree (e.g., BA, AB, BS)   Occupational History    Employer: RETIRED    Comment: accounting  Tobacco Use   Smoking status: Never   Smokeless tobacco: Never   Tobacco comments:    Never smoker  Vaping Use   Vaping status: Never Used  Substance and Sexual Activity   Alcohol use: Yes    Alcohol/week: 0.0 standard drinks of alcohol    Comment: Very Rarely   Drug use: No   Sexual activity: Not Currently    Birth control/protection: Surgical  Other Topics Concern   Not on file  Social History Narrative   Lives alone in 2 story home. Still very independent   Social Drivers of Corporate investment banker Strain: Low Risk  (06/20/2023)   Overall Financial Resource Strain (CARDIA)    Difficulty of Paying Living Expenses: Not hard at all  Food Insecurity: No Food Insecurity (06/20/2023)   Hunger Vital Sign    Worried About Running Out of Food in the Last Year: Never true    Ran Out of Food in the Last Year: Never true  Transportation Needs: No Transportation Needs (06/20/2023)   PRAPARE - Administrator, Civil Service (Medical): No    Lack of Transportation (Non-Medical): No  Physical Activity: Insufficiently Active (06/20/2023)   Exercise  Vital Sign    Days of Exercise per Week: 3 days    Minutes of Exercise per Session: 30 min  Stress: No Stress Concern Present (06/20/2023)   [employer] of Occupational Health - Occupational Stress Questionnaire    Feeling of Stress : Not at all  Social Connections: Moderately Integrated (06/20/2023)   Social Connection and Isolation Panel [NHANES]    Frequency of Communication with Friends and Family: More than three times a week    Frequency of Social Gatherings with Friends and Family: Three times a week    Attends Religious Services: More than 4 times per year    Active Member of Clubs or Organizations: Yes    Attends Banker Meetings: More than 4 times per year    Marital Status: Widowed  Intimate Partner Violence: Not At Risk (06/20/2023)    Humiliation, Afraid, Rape, and Kick questionnaire    Fear of Current or Ex-Partner: No    Emotionally Abused: No    Physically Abused: No    Sexually Abused: No   Past Surgical History:  Procedure Laterality Date   ABDOMINAL HYSTERECTOMY     BUNIONECTOMY     Bilateral   CARPAL TUNNEL RELEASE Left 03/10/2015   Procedure: LEFT CARPAL TUNNEL RELEASE;  Surgeon: Cindee Salt, MD;  Location: [facility];  Service: Orthopedics;  Laterality: Left;   CARPAL TUNNEL RELEASE Right 10/31/2017   Procedure: RIGHT CARPAL TUNNEL RELEASE;  Surgeon: Cindee Salt, MD;  Location: [facility];  Service: Orthopedics;  Laterality: Right;   CATARACT EXTRACTION W/PHACO  05/28/2012   Procedure: CATARACT EXTRACTION PHACO AND INTRAOCULAR LENS PLACEMENT (IOC);  Surgeon: Gemma Payor, MD;  Location: [facility];  Service: Ophthalmology;  Laterality: Right;  CDE=12.84   CATARACT EXTRACTION W/PHACO  06/07/2012   Procedure: CATARACT EXTRACTION PHACO AND INTRAOCULAR LENS PLACEMENT (IOC);  Surgeon: Gemma Payor, MD;  Location: [facility];  Service: Ophthalmology;  Laterality: Left;  CDE 17.60   COLONOSCOPY     COLONOSCOPY N/A 07/08/2015   Procedure: COLONOSCOPY;  Surgeon: Malissa Hippo, MD;  Location: [facility];  Service: Endoscopy;  Laterality: N/A;  930   ESOPHAGEAL DILATION N/A 07/08/2015   Procedure: ESOPHAGEAL DILATION;  Surgeon: Malissa Hippo, MD;  Location: [facility];  Service: Endoscopy;  Laterality: N/A;   ESOPHAGOGASTRODUODENOSCOPY N/A 07/08/2015   Procedure: ESOPHAGOGASTRODUODENOSCOPY (EGD);  Surgeon: Malissa Hippo, MD;  Location: [facility];  Service: Endoscopy;  Laterality: N/A;   Hernia     Right inguinal   HERNIA REPAIR  1961   right inguinal hernia   UPPER GASTROINTESTINAL ENDOSCOPY     Family History  Problem Relation Age of Onset   Arthritis Mother    Heart disease Mother    Cancer Father        pancreat   Pancreatic cancer Father    Arthritis Father        back issues     Cancer Brother        lung and liver   Liver cancer Brother    Lung cancer Brother    Heart disease Sister        Rheumatic Fever   Peptic Ulcer Sister    Diabetes Sister    Colon cancer Sister    Edema Sister    GI problems Sister        diverticulitis   Diabetes Sister    Neuropathy Sister    COPD Sister    Hiatal hernia  Sister    GI problems Sister     Current Outpatient Medications:    Acetaminophen (TYLENOL PO), Take 1 tablet by mouth daily as needed (pain, headache)., Disp: , Rfl:    ALPHA LIPOIC ACID PO, Take 1 capsule by mouth daily., Disp: , Rfl:    apixaban (ELIQUIS) 5 MG TABS tablet, Take 1 tablet (5 mg total) by mouth 2 (two) times daily., Disp: 180 tablet, Rfl: 1   Cyanocobalamin (VITAMIN B 12 PO), Take 1 tablet by mouth daily., Disp: , Rfl:    ferrous sulfate 325 (65 FE) MG tablet, Take 1 tablet (325 mg total) by mouth daily with breakfast., Disp: 60 tablet, Rfl: 0   Mesalamine (ASACOL) 400 MG CPDR DR capsule, TAKE 2 CAPSULES BY MOUTH DAILY, Disp: 180 capsule, Rfl: 3   metoprolol tartrate (LOPRESSOR) 25 MG tablet, TAKE 1 TABLET BY MOUTH TWICE  DAILY, Disp: 90 tablet, Rfl: 3   Multiple Vitamins-Minerals (MULTIVITAMIN WOMEN 50+) TABS, Take 1 tablet by mouth daily., Disp: , Rfl:    Multiple Vitamins-Minerals (ZINC PO), Take 1 tablet by mouth daily., Disp: , Rfl:    Nutritional Supplements (GRAPESEED EXTRACT PO), Take 1 capsule by mouth daily., Disp: , Rfl:    OVER THE COUNTER MEDICATION, Take 1 capsule by mouth daily. OTC : CoQ10 + red yeast rice, Disp: , Rfl:    pantoprazole (PROTONIX) 40 MG tablet, Take 1 tablet (40 mg total) by mouth daily before breakfast., Disp: 60 tablet, Rfl: 0   Pyridoxine HCl (VITAMIN B-6 PO), Take 1 tablet by mouth daily., Disp: , Rfl:    Sodium Hyaluronate, oral, (HYALURONIC ACID PO), Take 1 capsule by mouth daily., Disp: , Rfl:    TURMERIC CURCUMIN PO, Take 1 tablet by mouth 2 (two) times daily., Disp: , Rfl:   Allergies  Allergen Reactions    Penicillins Itching, Swelling and Rash   Covid-19 (Mrna) Vaccine Rash    Told by PCP to never take again.   Influenza Virus Vaccine Rash    Told by PCP to never take again   Tape Itching     ROS: Review of Systems Pertinent items noted in HPI and remainder of comprehensive ROS otherwise negative.    Physical exam BP 133/75   Pulse 61   Temp 98.7 F (37.1 C)   Ht 5' (1.524 m)   Wt 120 lb 9.6 oz (54.7 kg)   SpO2 95%   BMI 23.55 kg/m  General appearance: alert, cooperative, appears stated age, and no distress Head: Normocephalic, without obvious abnormality, atraumatic Eyes: negative findings: lids and lashes normal, conjunctivae and sclerae normal, corneas clear, and pupils equal, round, reactive to light and accomodation Ears: normal TM's and external ear canals both ears Nose: Nares normal. Septum midline. Mucosa normal. No drainage or sinus tenderness. Throat: lips, mucosa, and tongue normal; teeth and gums normal Neck: no adenopathy, supple, symmetrical, trachea midline, and thyroid not enlarged, symmetric, no tenderness/mass/nodules Back:  She has appreciable thoracolumbar scoliotic curve.  The thoracic spine curves the left in the lumbar spine curves to the right.  She has appreciable leg length discrepancy with what appears to be a shorter left leg.  She is ambulating independently Lungs: clear to auscultation bilaterally Heart: regular rate and rhythm, S1, S2 normal, no murmur, click, rub or gallop Abdomen: soft, non-tender; bowel sounds normal; no masses,  no organomegaly Extremities: extremities normal, atraumatic, no cyanosis or edema Pulses: 2+ and symmetric Skin: Skin color, texture, turgor normal. No rashes or lesions Lymph  nodes: Cervical, supraclavicular, and axillary nodes normal. Neurologic: Grossly normal      08/01/2023   12:58 PM 06/20/2023    4:19 PM 05/26/2023   12:08 PM  Depression screen PHQ 2/9  Decreased Interest 0 0 0  Down, Depressed, Hopeless 0  0 0  PHQ - 2 Score 0 0 0  Altered sleeping 0    Tired, decreased energy 0    Change in appetite 0    Feeling bad or failure about yourself  0    Trouble concentrating 0    Moving slowly or fidgety/restless 0    Suicidal thoughts 0    PHQ-9 Score 0    Difficult doing work/chores Not difficult at all        08/01/2023   12:58 PM 05/17/2022    3:45 PM 11/08/2021   10:15 AM 11/09/2020    3:11 PM  GAD 7 : Generalized Anxiety Score  Nervous, Anxious, on Edge 0 0 0 0  Control/stop worrying 0 0 0 0  Worry too much - different things 0 0 0 1  Trouble relaxing 0 0 0 0  Restless 0 0 0 0  Easily annoyed or irritable 0 0 0 0  Afraid - awful might happen 0 0 0 0  Total GAD 7 Score 0 0 0 1  Anxiety Difficulty Not difficult at all Not difficult at all Not difficult at all      Assessment/ Plan: Basilia Jumbo here for annual physical exam.   Primary hypertension - Plan: metoprolol tartrate (LOPRESSOR) 25 MG tablet  SVT (supraventricular tachycardia) (HCC) - Plan: metoprolol tartrate (LOPRESSOR) 25 MG tablet  Mixed hyperlipidemia  Vitamin D deficiency  Iron deficiency anemia due to chronic blood loss  Other acute pulmonary embolism without acute cor pulmonale (HCC) - Plan: Ambulatory referral to Hematology / Oncology  Other idiopathic scoliosis, thoracolumbar region - Plan: Ambulatory referral to Physical Therapy, Ambulatory referral to Neurosurgery  Blood pressure is well-controlled upon recheck.  No changes needed  I resent her beta-blocker to mail order per her request.  Appears that her heart doctor did send it locally but she does want this from mail order  We reviewed her labs today.  Her iron deficiency appears to have resolved.  I have gone ahead and placed referral to hematology/oncology given seemingly unprovoked PE.  I am not quite sure if she will require longer than 4 to 6 months anticoagulation but hopefully they can provide some insight  Referral to physical therapy  as well as spine and scoliosis for what seems to be worsening scoliosis of the thoracolumbar region with leg length discrepancy  Counseled on healthy lifestyle choices, including diet (rich in fruits, vegetables and lean meats and low in salt and simple carbohydrates) and exercise (at least 30 minutes of moderate physical activity daily).  Patient to follow up 3 to 6 months, sooner if concerns arise  Dajon Rowe M. Nadine Counts, DO

## 2023-08-03 ENCOUNTER — Inpatient Hospital Stay

## 2023-08-03 ENCOUNTER — Inpatient Hospital Stay: Attending: Oncology | Admitting: Oncology

## 2023-08-03 ENCOUNTER — Other Ambulatory Visit: Payer: Self-pay | Admitting: Oncology

## 2023-08-03 ENCOUNTER — Encounter: Payer: Self-pay | Admitting: Oncology

## 2023-08-03 VITALS — BP 138/85 | HR 74 | Temp 98.0°F | Resp 18 | Ht 60.0 in | Wt 119.9 lb

## 2023-08-03 DIAGNOSIS — Z86711 Personal history of pulmonary embolism: Secondary | ICD-10-CM

## 2023-08-03 DIAGNOSIS — R222 Localized swelling, mass and lump, trunk: Secondary | ICD-10-CM

## 2023-08-03 DIAGNOSIS — Z7901 Long term (current) use of anticoagulants: Secondary | ICD-10-CM

## 2023-08-03 DIAGNOSIS — K5190 Ulcerative colitis, unspecified, without complications: Secondary | ICD-10-CM | POA: Diagnosis not present

## 2023-08-03 DIAGNOSIS — I8290 Acute embolism and thrombosis of unspecified vein: Secondary | ICD-10-CM

## 2023-08-03 DIAGNOSIS — I2510 Atherosclerotic heart disease of native coronary artery without angina pectoris: Secondary | ICD-10-CM

## 2023-08-03 DIAGNOSIS — Z880 Allergy status to penicillin: Secondary | ICD-10-CM | POA: Diagnosis not present

## 2023-08-03 DIAGNOSIS — Z887 Allergy status to serum and vaccine status: Secondary | ICD-10-CM | POA: Diagnosis not present

## 2023-08-03 DIAGNOSIS — J90 Pleural effusion, not elsewhere classified: Secondary | ICD-10-CM | POA: Diagnosis not present

## 2023-08-03 DIAGNOSIS — Z79899 Other long term (current) drug therapy: Secondary | ICD-10-CM

## 2023-08-03 DIAGNOSIS — R0602 Shortness of breath: Secondary | ICD-10-CM

## 2023-08-03 DIAGNOSIS — K449 Diaphragmatic hernia without obstruction or gangrene: Secondary | ICD-10-CM | POA: Diagnosis not present

## 2023-08-03 DIAGNOSIS — Z800 Family history of malignant neoplasm of digestive organs: Secondary | ICD-10-CM

## 2023-08-03 DIAGNOSIS — I2699 Other pulmonary embolism without acute cor pulmonale: Secondary | ICD-10-CM

## 2023-08-03 DIAGNOSIS — Z8041 Family history of malignant neoplasm of ovary: Secondary | ICD-10-CM

## 2023-08-03 LAB — D-DIMER, QUANTITATIVE: D-Dimer, Quant: 0.86 ug{FEU}/mL — ABNORMAL HIGH (ref 0.00–0.50)

## 2023-08-03 NOTE — Patient Instructions (Signed)
 VISIT SUMMARY:  Today, we reviewed your history of blood clots in the lungs, ulcerative colitis, and supraventricular tachycardia (SVT). We discussed your current medications and any new symptoms you are experiencing. We also reviewed your family history of cancer and planned some follow-up tests.  YOUR PLAN:  -PULMONARY EMBOLISM: A pulmonary embolism is a blood clot in the lungs. You have been on Eliquis for three months to prevent further clots. We will perform a D-dimer test today to help decide if you need to continue this medication. If the test is negative, we may reduce or discontinue Eliquis, depending on your risk of recurrence.  -ULCERATIVE COLITIS: Ulcerative colitis is a chronic inflammation of the colon. Your condition is well-controlled, but it does increase your risk of blood clots. No changes to your current treatment are needed.  INSTRUCTIONS:  Please perform the D-dimer test today. Continue taking Eliquis as prescribed for the next three months. Follow up with your cardiologist next week for your SVT. We will also perform a stool test for occult blood to monitor for any early signs of cancer.

## 2023-08-03 NOTE — Progress Notes (Signed)
 [facility] at [facility]  HEMATOLOGY NEW VISIT  [place], [place] M, [place]  REASON FOR REFERRAL: Venous thromboembolism  SUMMARY OF HEMATOLOGIC HISTORY: 05/06/2023: 1st episode: B/L PE Presentation: Dyspnea Risk factors: IBD Current treatment: Eliquis 5 mg BID  HISTORY OF PRESENT ILLNESS: Lori Soto 88 y.o. female referred for anticoagulation recommendation for bilateral PE. The patient is accompanied by her friend today.  She has a past medical history of ulcerative colitis and is on mesalamine.  She recently had an episode of SVT that needed ablation with adenosine and is going to see her cardiologist this week.  Patient was of very good health until diagnosed of PE in January.  She had an acute onset of shortness of breath that led her to go to the ER, CT PE at that time showed bilateral PE and was started on heparin drip and transition to Eliquis.  Patient is currently taking Eliquis 5 mg twice a day.  She is very compliant and has no issues from it.  She was also iron deficient while she was in the hospital and that has since resolved.  She is very active around the house, lives alone.  She does report some dark-colored stools attributed to oral iron.  She denies feeling fatigued, dyspnea on exertion, dizziness.  Patient has no history of falls.  She is not a smoker, does not drink alcohol.  Lives by herself in [place].  She has ovarian cancer in sister, pancreatic cancer in father, liver disease in her brother.  I have reviewed the past medical history, past surgical history, social history and family history with the patient   ALLERGIES:  is allergic to penicillins, covid-19 (mrna) vaccine, influenza virus vaccine, and tape.  MEDICATIONS:  Current Outpatient Medications  Medication Sig Dispense Refill   Acetaminophen (TYLENOL PO) Take 1 tablet by mouth daily as needed (pain, headache).     ALPHA LIPOIC ACID PO Take 1 capsule by mouth daily.     apixaban  (ELIQUIS) 5 MG TABS tablet Take 1 tablet (5 mg total) by mouth 2 (two) times daily. 180 tablet 1   Cyanocobalamin (VITAMIN B 12 PO) Take 1 tablet by mouth daily.     ferrous sulfate 325 (65 FE) MG tablet Take 1 tablet (325 mg total) by mouth daily with breakfast. 60 tablet 0   Mesalamine (ASACOL) 400 MG CPDR DR capsule TAKE 2 CAPSULES BY MOUTH DAILY 180 capsule 3   metoprolol tartrate (LOPRESSOR) 25 MG tablet Take 1 tablet (25 mg total) by mouth 2 (two) times daily. 90 tablet 3   Multiple Vitamins-Minerals (MULTIVITAMIN WOMEN 50+) TABS Take 1 tablet by mouth daily.     Multiple Vitamins-Minerals (ZINC PO) Take 1 tablet by mouth daily.     Nutritional Supplements (GRAPESEED EXTRACT PO) Take 1 capsule by mouth daily.     OVER THE COUNTER MEDICATION Take 1 capsule by mouth daily. OTC : CoQ10 + red yeast rice     pantoprazole (PROTONIX) 40 MG tablet Take 1 tablet (40 mg total) by mouth daily before breakfast. 60 tablet 0   Pyridoxine HCl (VITAMIN B-6 PO) Take 1 tablet by mouth daily.     Sodium Hyaluronate, oral, (HYALURONIC ACID PO) Take 1 capsule by mouth daily.     TURMERIC CURCUMIN PO Take 1 tablet by mouth 2 (two) times daily.     No current facility-administered medications for this visit.     REVIEW OF SYSTEMS:   Constitutional: Denies fevers, chills or  night sweats Eyes: Denies blurriness of vision Ears, nose, mouth, throat, and face: Denies mucositis or sore throat Respiratory: Denies cough, dyspnea or wheezes Cardiovascular: Denies palpitation, chest discomfort or lower extremity swelling Gastrointestinal:  Denies nausea, heartburn or change in bowel habits Skin: Denies abnormal skin rashes Lymphatics: Denies new lymphadenopathy or easy bruising Neurological:Denies numbness, tingling or new weaknesses Behavioral/Psych: Mood is stable, no new changes  All other systems were reviewed with the patient and are negative.  PHYSICAL EXAMINATION:   Vitals:   08/03/23 1046  BP: 138/85   Pulse: 74  Resp: 18  Temp: 98 F (36.7 C)  SpO2: 98%    GENERAL:alert, no distress and comfortable LUNGS: clear to auscultation and percussion with normal breathing effort HEART: regular rate & rhythm and no murmurs and no lower extremity edema ABDOMEN:abdomen soft, non-tender and normal bowel sounds Musculoskeletal:no cyanosis of digits and no clubbing  NEURO: alert & oriented x 3 with fluent speech  LABORATORY DATA:  I have reviewed the data as listed  Lab Results  Component Value Date   WBC 5.0 07/10/2023   NEUTROABS 3.5 07/10/2023   HGB 14.6 07/10/2023   HCT 44.9 07/10/2023   MCV 90.5 07/10/2023   PLT 243 07/10/2023       Chemistry      Component Value Date/Time   NA 140 07/10/2023 1033   NA 144 06/29/2023 1055   K 4.1 07/10/2023 1033   CL 102 07/10/2023 1033   CO2 27 07/10/2023 1033   BUN 10 07/10/2023 1033   BUN 11 06/29/2023 1055   CREATININE 0.75 07/10/2023 1033   CREATININE 0.69 05/14/2013 1239      Component Value Date/Time   CALCIUM 9.4 07/10/2023 1033   ALKPHOS 45 06/29/2023 1055   AST 25 06/29/2023 1055   ALT 19 06/29/2023 1055   BILITOT 0.6 06/29/2023 1055      Latest Reference Range & Units 06/29/23 10:55  Iron 27 - 139 ug/dL 161  UIBC 096 - 045 ug/dL 409  TIBC 811 - 914 ug/dL 782  Ferritin 15 - 956 ng/mL 192 (H)  Iron Saturation 15 - 55 % 35  (H): Data is abnormally high  RADIOGRAPHIC STUDIES: I have personally reviewed the radiological images as listed and agreed with the findings in the report.  [place] B/L LE: 05/07/23: IMPRESSION: No evidence of deep venous thrombosis in either lower extremity.  CT PE: 05/06/23: IMPRESSION: 1. Positive examination for pulmonary embolism with lobar to segmental embolus present bilaterally. 2. Ground-glass airspace opacity in the dependent right lower lobe which is distal to a large burden of embolus in the right lower lobe pulmonary arteries. This is concerning for pulmonary infarct. 3. Small right  pleural effusion, likely reactive. 4. RV LV ratio 1.0, however of uncertain reliability given very substantial mass effect of large hiatal hernia. 5. Unusually large hiatal hernia containing the stomach and a large portion of the small bowel and colon. 6. Coronary artery disease.  DG Chest 2 View CLINICAL DATA:  Shortness of breath.  EXAM: CHEST - 2 VIEW  COMPARISON:  05/06/2023.  FINDINGS: Bilateral lung fields are grossly clear. No pulmonary edema. No large pleural effusion or pneumothorax.  Stable cardio-mediastinal silhouette. Redemonstration of large diaphragmatic hernias.  No acute osseous abnormalities.  The soft tissues are within normal limits.  IMPRESSION: No active cardiopulmonary disease.  Electronically Signed   By: Jules Schick M.D.   On: 07/10/2023 12:15   ASSESSMENT & PLAN:  Patient is an 88 year old female  referred for anticoagulation recommendations for unprovoked PE   Pulmonary embolism [facility]) Patient was diagnosed of unprovoked PE in January 2025.  Has since been on Eliquis 5 mg twice daily.  No complications or falls. -Patient has no indication for thrombophilia testing since she is 87 years and has slight risk factors of IBS and old age. -Discussed that we can do a D-dimer guided anticoagulation therapy.  Will obtain a D-dimer today, if negative can discuss cutting down the dose to half or discontinuing Eliquis. -If elevated, will discuss continuing the treatment and repeating D-dimer testing at 35-month point. -Will obtain a Cologuard testing in the setting of iron deficiency anemia and to rule out possibility of colon cancer.  Return to clinic in 2 weeks to discuss results   Orders Placed This Encounter  Procedures   Occult blood x 1 card to lab, stool    Standing Status:   Future    Expected Date:   08/03/2023    Expiration Date:   08/02/2024   D-dimer, quantitative    Standing Status:   Future    Number of Occurrences:   1    Expected  Date:   08/03/2023    Expiration Date:   08/02/2024    The total time spent in the appointment was 60 minutes encounter with patients including review of chart and various tests results, discussions about plan of care and coordination of care plan   All questions were answered. The patient knows to call the clinic with any problems, questions or concerns. No barriers to learning was detected.   Cindie Crumbly, MD 4/3/20253:13 PM

## 2023-08-03 NOTE — Assessment & Plan Note (Signed)
 Patient was diagnosed of unprovoked PE in January 2025.  Has since been on Eliquis 5 mg twice daily.  No complications or falls. -Patient has no indication for thrombophilia testing since she is 87 years and has slight risk factors of IBS and old age. -Discussed that we can do a D-dimer guided anticoagulation therapy.  Will obtain a D-dimer today, if negative can discuss cutting down the dose to half or discontinuing Eliquis. -If elevated, will discuss continuing the treatment and repeating D-dimer testing at 40-month point. -Will obtain a Cologuard testing in the setting of iron deficiency anemia and to rule out possibility of colon cancer.  Return to clinic in 2 weeks to discuss results

## 2023-08-08 NOTE — Progress Notes (Unsigned)
 Cardiology Office Note:   Date:  08/09/2023  ID:  Lori Soto, DOB 1935/10/06, MRN [id number] PCP: Raliegh Ip, DO  [facility] Providers Cardiologist:  [name], MD {  History of Present Illness:   Lori Soto is a 88 y.o. female who presents for evaluation of palpitations.   I saw her for evaluation of palpitations in 2014.  She has a heart murmur and an echo in 2011 was normal.  She was in in the ED in August. 2018 but there was no recorded arrhythmia.  She had palpitations but these were infrequent so no further work up was suggested.   She was in the ED in April 2020 with SVT.  She had mildly elevated troponin.   She had an SVT with a rate of 174 She was treated with adenosine.     She was treated with metoprolol.   She had ED presentation in August 2022 requiring adenosine.  She was in the ED most recently in July 2024 for this.  In Jan she had an echo with NL EF with mild TR.  She had increased RV size.  She had mildly elevated pulmonary pressure.    She was again in the ED in March 2025.  I reviewed these records for this visit.    She was treated with adenosine.    She was also in the hospital in January and I reviewed these records.  She had pulmonary embolism which appears to be unprovoked.  There was no evidence of DVT.  She has been treated with Eliquis 5 mg twice daily since then.  She did see hematology recently and had a D-dimer done and there was going to be consideration of reducing her dose of Eliquis and there was conversation about whether she would come off of it eventually.  She feels well.  She is having some back problems.  She is not having any problems taking the blood thinner and reports no bleeding although her Cologuard is pending.  She is having no palpitations, presyncope or syncope.  She denies any chest pressure, neck or arm discomfort.  I do see that an echocardiogram done at the time of her PE demonstrated low normal right  ventricular function with moderately elevated right ventricular size mildly elevated pulmonary pressures.  There is a small pericardial effusion.  The ejection fraction of the left ventricle was 60 to 65%.  ROS: As stated in the HPI and negative for all other systems.  Studies Reviewed:    EKG:   NA  Risk Assessment/Calculations:         Physical Exam:   VS:  BP (!) 142/80   Pulse 64   Ht 5' (1.524 m)   Wt 120 lb (54.4 kg)   BMI 23.44 kg/m    Wt Readings from Last 3 Encounters:  08/09/23 120 lb (54.4 kg)  08/03/23 119 lb 14.9 oz (54.4 kg)  08/01/23 120 lb 9.6 oz (54.7 kg)     GEN: Well nourished, well developed in no acute distress NECK: No JVD; No carotid bruits CARDIAC: RRR, 2 out of 6 brief apical systolic murmur heard best at the left and right upper sternal border, no diastolic murmurs, rubs, gallops RESPIRATORY:  Clear to auscultation without rales, wheezing or rhonchi  ABDOMEN: Soft, non-tender, non-distended EXTREMITIES:  No edema; No deformity   ASSESSMENT AND PLAN:   SVT:   She denied that had another long conversation and I think she be a candidate for  SVT ablation but she does not still want to consider this.  She does not think she is having it frequently enough.  She would not tolerate a higher dose of beta-blocker as she has some low blood pressures.  No change in therapy.  HTN:   Her blood pressure is at target.  She brings me a blood pressure diary and actually is usually very well-controlled typically not higher than 120 with some occasional systolics in the 90s.  Heart rates are in the 50s.  No change in therapy.   PE: I have taken the liberty of reducing her Eliquis to 2.5 mg twice daily.  Her D-dimer was mildly elevated.  I think she should be on this indefinitely.  Of note she was not anemic in March on her most recent CBC.       Follow up with me in 6 months.  Signed, [name], MD

## 2023-08-09 ENCOUNTER — Encounter: Payer: Self-pay | Admitting: Cardiology

## 2023-08-09 ENCOUNTER — Ambulatory Visit (INDEPENDENT_AMBULATORY_CARE_PROVIDER_SITE_OTHER): Admitting: Cardiology

## 2023-08-09 ENCOUNTER — Encounter: Payer: Self-pay | Admitting: Physical Therapy

## 2023-08-09 ENCOUNTER — Other Ambulatory Visit: Payer: Self-pay

## 2023-08-09 ENCOUNTER — Ambulatory Visit: Attending: Family Medicine | Admitting: Physical Therapy

## 2023-08-09 VITALS — BP 142/80 | HR 64 | Ht 60.0 in | Wt 120.0 lb

## 2023-08-09 DIAGNOSIS — M4125 Other idiopathic scoliosis, thoracolumbar region: Secondary | ICD-10-CM | POA: Diagnosis not present

## 2023-08-09 DIAGNOSIS — I10 Essential (primary) hypertension: Secondary | ICD-10-CM | POA: Diagnosis not present

## 2023-08-09 DIAGNOSIS — I471 Supraventricular tachycardia, unspecified: Secondary | ICD-10-CM | POA: Diagnosis not present

## 2023-08-09 DIAGNOSIS — M62830 Muscle spasm of back: Secondary | ICD-10-CM

## 2023-08-09 DIAGNOSIS — R293 Abnormal posture: Secondary | ICD-10-CM

## 2023-08-09 MED ORDER — APIXABAN 2.5 MG PO TABS: 2.5000 mg | ORAL_TABLET | Freq: Two times a day (BID) | ORAL | 6 refills | Status: DC

## 2023-08-09 NOTE — Patient Instructions (Signed)
 Medication Instructions:  Please decrease Eliquis to 2.5 mg twice a day. Continue all other medications as listed.  *If you need a refill on your cardiac medications before your next appointment, please call your pharmacy*  Follow-Up: At [facility], you and your health needs are our priority.  As part of our continuing mission to provide you with exceptional heart care, our providers are all part of one team.  This team includes your primary Cardiologist (physician) and Advanced Practice Providers or APPs (Physician Assistants and Nurse Practitioners) who all work together to provide you with the care you need, when you need it.  Your next appointment:   6 month(s)  Provider:   [name], MD    We recommend signing up for the patient portal called "MyChart".  Sign up information is provided on this After Visit Summary.  MyChart is used to connect with patients for Virtual Visits (Telemedicine).  Patients are able to view lab/test results, encounter notes, upcoming appointments, etc.  Non-urgent messages can be sent to your provider as well.   To learn more about what you can do with MyChart, go to [email or web address].

## 2023-08-09 NOTE — Therapy (Signed)
 OUTPATIENT PHYSICAL THERAPY THORACOLUMBAR EVALUATION   Patient Name: Lori Soto MRN: [id number] DOB:[birth year], 88 y.o., female Today's Date: 08/09/2023  END OF SESSION:  PT End of Session - 08/09/23 1438     Visit Number 1    Number of Visits 12    Date for PT Re-Evaluation 11/07/23    PT Start Time 0200    PT Stop Time 0226    PT Time Calculation (min) 26 min    Activity Tolerance Patient tolerated treatment well    Behavior During Therapy WFL for tasks assessed/performed             Past Medical History:  Diagnosis Date   Anemia    Arthritis    Cancer (HCC)    skin cancer on nose   Carpal tunnel syndrome, bilateral    Cataract    Esophagitis    GERD (gastroesophageal reflux disease)    H/O hiatal hernia    Osteopenia    Scoliosis    Shingles    SVT (supraventricular tachycardia) (HCC)    Ulcerative colitis    Past Surgical History:  Procedure Laterality Date   ABDOMINAL HYSTERECTOMY     BUNIONECTOMY     Bilateral   CARPAL TUNNEL RELEASE Left 03/10/2015   Procedure: LEFT CARPAL TUNNEL RELEASE;  Surgeon: Cindee Salt, MD;  Location: [facility];  Service: Orthopedics;  Laterality: Left;   CARPAL TUNNEL RELEASE Right 10/31/2017   Procedure: RIGHT CARPAL TUNNEL RELEASE;  Surgeon: Cindee Salt, MD;  Location: [facility];  Service: Orthopedics;  Laterality: Right;   CATARACT EXTRACTION W/PHACO  05/28/2012   Procedure: CATARACT EXTRACTION PHACO AND INTRAOCULAR LENS PLACEMENT (IOC);  Surgeon: Gemma Payor, MD;  Location: [facility];  Service: Ophthalmology;  Laterality: Right;  CDE=12.84   CATARACT EXTRACTION W/PHACO  06/07/2012   Procedure: CATARACT EXTRACTION PHACO AND INTRAOCULAR LENS PLACEMENT (IOC);  Surgeon: Gemma Payor, MD;  Location: [facility];  Service: Ophthalmology;  Laterality: Left;  CDE 17.60   COLONOSCOPY     COLONOSCOPY N/A 07/08/2015   Procedure: COLONOSCOPY;  Surgeon: Malissa Hippo, MD;  Location: [facility];  Service:  Endoscopy;  Laterality: N/A;  930   ESOPHAGEAL DILATION N/A 07/08/2015   Procedure: ESOPHAGEAL DILATION;  Surgeon: Malissa Hippo, MD;  Location: [facility];  Service: Endoscopy;  Laterality: N/A;   ESOPHAGOGASTRODUODENOSCOPY N/A 07/08/2015   Procedure: ESOPHAGOGASTRODUODENOSCOPY (EGD);  Surgeon: Malissa Hippo, MD;  Location: [facility];  Service: Endoscopy;  Laterality: N/A;   Hernia     Right inguinal   HERNIA REPAIR  1961   right inguinal hernia   UPPER GASTROINTESTINAL ENDOSCOPY     Patient Active Problem List   Diagnosis Date Noted   Hospital discharge follow-up 05/17/2023   Pulmonary embolism (HCC) 05/06/2023   Murmur 03/07/2023   History of supraventricular tachycardia 11/08/2021   Arthritis 06/13/2019   GERD (gastroesophageal reflux disease) 06/10/2019   SVT (supraventricular tachycardia) (HCC) 08/28/2018   Light headedness 05/09/2018   Carpal tunnel syndrome of right wrist 05/20/2015   Primary localized osteoarthrosis, hand 05/20/2015   Carpal tunnel syndrome on left 03/18/2015   Osteopenia of the elderly 02/19/2014   DDD (degenerative disc disease), lumbar 12/03/2013   Scoliosis 12/03/2013   Chronic low back pain 05/14/2013   IBS (irritable bowel syndrome) 11/08/2012   Hypertension 10/26/2011   Chronic ulcerative colitis (HCC) 03/23/2011   REFERRING PROVIDER: Delynn Flavin DO  REFERRING DIAG: Other idiopathic scoliosis, thoracolumbar region.  Rationale  for Evaluation and Treatment: Rehabilitation  THERAPY DIAG:  Muscle spasm of back  Abnormal posture  ONSET DATE: Many years.  SUBJECTIVE:                                                                                                                                                                                           SUBJECTIVE STATEMENT: The patient presents to the clinic with c/o chronic low back pain and more recently right hip pain.  Today, her pain-level is a low 2/10 but yesterday she was at  an event sitting on a stool and her pain was very high.  Heat and massage and traction decrease her pain.  Prolonged sitting and standing increase her pain.    PERTINENT HISTORY:  Scoliosis.  See above.    PAIN:  Are you having pain? Yes: NPRS scale: 2/10. Pain location: LB, right > left and left hip.  .   Pain description: Ache. Aggravating factors: As above. Relieving factors: As above.  PRECAUTIONS: None  RED FLAGS: None   WEIGHT BEARING RESTRICTIONS: No  FALLS:  Has patient fallen in last 6 months? No  LIVING ENVIRONMENT: Lives with: lives alone Lives in: House/apartment Has following equipment at home: None  OCCUPATION: Retired but leads a very active life.   PLOF: Independent  PATIENT GOALS: Do more with less pain.    OBJECTIVE:  Note: Objective measures were completed at Evaluation unless otherwise noted.  PATIENT SURVEYS:  Modified Oswestry 8/50.   POSTURE: rounded shoulders, forward head, increased lumbar lordosis, increased thoracic kyphosis, and flexed trunk , T-L scoliosis, left shoulder depression.  PALPATION: CC today is palpable tenderness over her right lumbar musculature though she states some days "it switches". She is also over her left lateral hip musculature and greater trochanter.   Genu valgum, right > left.    LUMBAR ROM:   Full active lumbar flexion and extension to 0 degrees.    LOWER EXTREMITY MMT:    Normal LE strength.  LUMBAR SPECIAL TESTS:  (-) SLR testing.     GAIT: The patient walks in a flexed trunk posture and leftward lean.  PATIENT EDUCATION:  Education details:  Person educated:  International aid/development worker:  Education comprehension:   HOME EXERCISE PROGRAM:   ASSESSMENT:  CLINICAL IMPRESSION: The patient presents to OPPT with chronic low back pain.  Her CC today was right-sided musculature  pain and left lateral hip and greater trochanteric pain.  She has multiple postural abnormalities with pronounced thoraco-lumbar scoliosis.  Her Modified Owestry score is 8/50.   Patient will benefit from skilled physical therapy intervention to address pain and deficits. OBJECTIVE IMPAIRMENTS: Abnormal gait, decreased activity tolerance, decreased ROM, increased muscle spasms, postural dysfunction, and pain.   ACTIVITY LIMITATIONS: carrying, lifting, bending, sitting, standing, and locomotion level  PARTICIPATION LIMITATIONS: meal prep, cleaning, laundry, community activity, and yard work  PERSONAL FACTORS: Time since onset of injury/illness/exacerbation and 1 comorbidity: scoliosis  are also affecting patient's functional outcome.   REHAB POTENTIAL: Good-  CLINICAL DECISION MAKING: Evolving/moderate complexity  EVALUATION COMPLEXITY: Moderate   GOALS:  LONG TERM GOALS: Target date: 11/07/23.  Ind with a HEP.  Goal status: INITIAL  2.  Perform ADL's with pain not > 4/10.  Goal status: INITIAL  3.  Sit 30 minutes with pain not > 4/10.  Goal status: INITIAL   PLAN:  PT FREQUENCY: 2x/week  PT DURATION: 6 weeks  PLANNED INTERVENTIONS: 97110-Therapeutic exercises, 97530- Therapeutic activity, [id number]- Neuromuscular re-education, 97535- Self Care, [id number]- Manual therapy, [id number]- Electrical stimulation (unattended), 97035- Ultrasound, 60454- Traction (mechanical), Dry Needling, Cryotherapy, and Moist heat.  PLAN FOR NEXT SESSION: Combo e'stim/US at 1.50 W/CM2, STW/M, core exercise progression, spinal protection techniques and body mechanics training.  Intermittent traction beginning at 35% body weight.   [place] Masaki, [place], PT 08/09/2023, 3:46 PM

## 2023-08-10 ENCOUNTER — Other Ambulatory Visit: Payer: Self-pay

## 2023-08-10 ENCOUNTER — Ambulatory Visit: Admitting: Physical Therapy

## 2023-08-10 DIAGNOSIS — K449 Diaphragmatic hernia without obstruction or gangrene: Secondary | ICD-10-CM | POA: Diagnosis not present

## 2023-08-10 DIAGNOSIS — J90 Pleural effusion, not elsewhere classified: Secondary | ICD-10-CM | POA: Diagnosis not present

## 2023-08-10 DIAGNOSIS — I2510 Atherosclerotic heart disease of native coronary artery without angina pectoris: Secondary | ICD-10-CM | POA: Diagnosis not present

## 2023-08-10 DIAGNOSIS — R222 Localized swelling, mass and lump, trunk: Secondary | ICD-10-CM | POA: Diagnosis not present

## 2023-08-10 DIAGNOSIS — I2699 Other pulmonary embolism without acute cor pulmonale: Secondary | ICD-10-CM | POA: Diagnosis not present

## 2023-08-10 DIAGNOSIS — I8290 Acute embolism and thrombosis of unspecified vein: Secondary | ICD-10-CM

## 2023-08-10 DIAGNOSIS — K5190 Ulcerative colitis, unspecified, without complications: Secondary | ICD-10-CM | POA: Diagnosis not present

## 2023-08-10 LAB — OCCULT BLOOD X 1 CARD TO LAB, STOOL: Fecal Occult Bld: NEGATIVE

## 2023-08-15 ENCOUNTER — Ambulatory Visit

## 2023-08-15 VITALS — BP 123/78

## 2023-08-15 DIAGNOSIS — M62830 Muscle spasm of back: Secondary | ICD-10-CM

## 2023-08-15 DIAGNOSIS — R293 Abnormal posture: Secondary | ICD-10-CM | POA: Diagnosis not present

## 2023-08-15 DIAGNOSIS — M4125 Other idiopathic scoliosis, thoracolumbar region: Secondary | ICD-10-CM | POA: Diagnosis not present

## 2023-08-15 NOTE — Therapy (Signed)
 OUTPATIENT PHYSICAL THERAPY THORACOLUMBAR TREATMENT   Patient Name: Lori Soto MRN: [id number] DOB:1935/07/16, 88 y.o., female Today's Date: 08/15/2023  END OF SESSION:  PT End of Session - 08/15/23 1535     Visit Number 2    Number of Visits 12    Date for PT Re-Evaluation 11/07/23    PT Start Time 1525   Patient arrived late to her appointment.   PT Stop Time 1624    PT Time Calculation (min) 59 min    Activity Tolerance Patient tolerated treatment well    Behavior During Therapy WFL for tasks assessed/performed              Past Medical History:  Diagnosis Date   Anemia    Arthritis    Cancer (HCC)    skin cancer on nose   Carpal tunnel syndrome, bilateral    Cataract    Esophagitis    GERD (gastroesophageal reflux disease)    H/O hiatal hernia    Osteopenia    Scoliosis    Shingles    SVT (supraventricular tachycardia) (HCC)    Ulcerative colitis    Past Surgical History:  Procedure Laterality Date   ABDOMINAL HYSTERECTOMY     BUNIONECTOMY     Bilateral   CARPAL TUNNEL RELEASE Left 03/10/2015   Procedure: LEFT CARPAL TUNNEL RELEASE;  Surgeon: Lyanne Sample, MD;  Location: [facility];  Service: Orthopedics;  Laterality: Left;   CARPAL TUNNEL RELEASE Right 10/31/2017   Procedure: RIGHT CARPAL TUNNEL RELEASE;  Surgeon: Lyanne Sample, MD;  Location: [facility];  Service: Orthopedics;  Laterality: Right;   CATARACT EXTRACTION W/PHACO  05/28/2012   Procedure: CATARACT EXTRACTION PHACO AND INTRAOCULAR LENS PLACEMENT (IOC);  Surgeon: Anner Kill, MD;  Location: [facility];  Service: Ophthalmology;  Laterality: Right;  CDE=12.84   CATARACT EXTRACTION W/PHACO  06/07/2012   Procedure: CATARACT EXTRACTION PHACO AND INTRAOCULAR LENS PLACEMENT (IOC);  Surgeon: Anner Kill, MD;  Location: [facility];  Service: Ophthalmology;  Laterality: Left;  CDE 17.60   COLONOSCOPY     COLONOSCOPY N/A 07/08/2015   Procedure: COLONOSCOPY;  Surgeon: Ruby Corporal, MD;   Location: [facility];  Service: Endoscopy;  Laterality: N/A;  930   ESOPHAGEAL DILATION N/A 07/08/2015   Procedure: ESOPHAGEAL DILATION;  Surgeon: Ruby Corporal, MD;  Location: [facility];  Service: Endoscopy;  Laterality: N/A;   ESOPHAGOGASTRODUODENOSCOPY N/A 07/08/2015   Procedure: ESOPHAGOGASTRODUODENOSCOPY (EGD);  Surgeon: Ruby Corporal, MD;  Location: [facility];  Service: Endoscopy;  Laterality: N/A;   Hernia     Right inguinal   HERNIA REPAIR  1961   right inguinal hernia   UPPER GASTROINTESTINAL ENDOSCOPY     Patient Active Problem List   Diagnosis Date Noted   Hospital discharge follow-up 05/17/2023   Pulmonary embolism (HCC) 05/06/2023   Murmur 03/07/2023   History of supraventricular tachycardia 11/08/2021   Arthritis 06/13/2019   GERD (gastroesophageal reflux disease) 06/10/2019   SVT (supraventricular tachycardia) (HCC) 08/28/2018   Light headedness 05/09/2018   Carpal tunnel syndrome of right wrist 05/20/2015   Primary localized osteoarthrosis, hand 05/20/2015   Carpal tunnel syndrome on left 03/18/2015   Osteopenia of the elderly 02/19/2014   DDD (degenerative disc disease), lumbar 12/03/2013   Scoliosis 12/03/2013   Chronic low back pain 05/14/2013   IBS (irritable bowel syndrome) 11/08/2012   Hypertension 10/26/2011   Chronic ulcerative colitis (HCC) 03/23/2011   REFERRING PROVIDER: Vicky Grange DO  REFERRING  DIAG: Other idiopathic scoliosis, thoracolumbar region.  Rationale for Evaluation and Treatment: Rehabilitation  THERAPY DIAG:  Muscle spasm of back  Abnormal posture  ONSET DATE: Many years.  SUBJECTIVE:                                                                                                                                                                                           SUBJECTIVE STATEMENT: Patient reports that she has not been doing much today. However, she is still hurting a little.   PERTINENT HISTORY:   Scoliosis.  See above.    PAIN:  Are you having pain? Yes: NPRS scale: 3/10. Pain location: LB, right > left and left hip.  .   Pain description: Ache. Aggravating factors: As above. Relieving factors: As above.  PRECAUTIONS: None  RED FLAGS: None   WEIGHT BEARING RESTRICTIONS: No  FALLS:  Has patient fallen in last 6 months? No  LIVING ENVIRONMENT: Lives with: lives alone Lives in: House/apartment Has following equipment at home: None  OCCUPATION: Retired but leads a very active life.   PLOF: Independent  PATIENT GOALS: Do more with less pain.   OBJECTIVE:  Note: Objective measures were completed at Evaluation unless otherwise noted.  PATIENT SURVEYS:  Modified Oswestry 8/50.   POSTURE: rounded shoulders, forward head, increased lumbar lordosis, increased thoracic kyphosis, and flexed trunk , T-L scoliosis, left shoulder depression.  PALPATION: CC today is palpable tenderness over her right lumbar musculature though she states some days "it switches". She is also over her left lateral hip musculature and greater trochanter.   Genu valgum, right > left.    LUMBAR ROM:   Full active lumbar flexion and extension to 0 degrees.    LOWER EXTREMITY MMT:    Normal LE strength.  LUMBAR SPECIAL TESTS:  (-) SLR testing.     GAIT: The patient walks in a flexed trunk posture and leftward lean.                                                                                                                         TODAY'S TREATMENT:  08/15/23 EXERCISE LOG  Exercise Repetitions and Resistance Comments  Nustep  L3 x 10 minutes   BP assessment and education 123/78    Seated hip ADD isometric  4 minutes w/ 5 second hold            Blank cell = exercise not performed today  Modalities: no adverse reaction to today's modalities  Date:  Unattended Estim: right lumbar paraspinals, pre mod @ 80-150 Hz, 15 mins, Pain and Tone Hot  Pack: Lumbar, 15 mins, Pain and Tone  PATIENT EDUCATION:  Education details: blood pressure, healing, electrical stimulation, specialist follow up, and the effect of exercise on her BP Person educated: patient Education method: verbal  Education comprehension: patient reported understanding   HOME EXERCISE PROGRAM:   ASSESSMENT:  CLINICAL IMPRESSION: Patient was introduced to multiple new interventions for improved lower extremity mobility. She requested to have her blood pressure assessed due to it being elevated at previous physicians appointments. Her blood pressure was assessed at 123/78 and she was educated on these results. She reported understanding. She reported feeling good upon the conclusion of treatment. She continues to require skilled physical therapy to address her remaining impairments to return to her prior level of function.   OBJECTIVE IMPAIRMENTS: Abnormal gait, decreased activity tolerance, decreased ROM, increased muscle spasms, postural dysfunction, and pain.   ACTIVITY LIMITATIONS: carrying, lifting, bending, sitting, standing, and locomotion level  PARTICIPATION LIMITATIONS: meal prep, cleaning, laundry, community activity, and yard work  PERSONAL FACTORS: Time since onset of injury/illness/exacerbation and 1 comorbidity: scoliosis  are also affecting patient's functional outcome.   REHAB POTENTIAL: Good-  CLINICAL DECISION MAKING: Evolving/moderate complexity  EVALUATION COMPLEXITY: Moderate   GOALS:  LONG TERM GOALS: Target date: 11/07/23.  Ind with a HEP.  Goal status: INITIAL  2.  Perform ADL's with pain not > 4/10.  Goal status: INITIAL  3.  Sit 30 minutes with pain not > 4/10.  Goal status: INITIAL   PLAN:  PT FREQUENCY: 2x/week  PT DURATION: 6 weeks  PLANNED INTERVENTIONS: 97110-Therapeutic exercises, 97530- Therapeutic activity, [id number]- Neuromuscular re-education, 97535- Self Care, [id number]- Manual therapy, G0283- Electrical stimulation  (unattended), 97035- Ultrasound, 88416- Traction (mechanical), Dry Needling, Cryotherapy, and Moist heat.  PLAN FOR NEXT SESSION: Combo e'stim/US  at 1.50 W/CM2, STW/M, core exercise progression, spinal protection techniques and body mechanics training.  Intermittent traction beginning at 35% body weight.   Lane Pinon, PT 08/15/2023, 4:36 PM

## 2023-08-17 ENCOUNTER — Inpatient Hospital Stay (HOSPITAL_BASED_OUTPATIENT_CLINIC_OR_DEPARTMENT_OTHER): Admitting: Oncology

## 2023-08-17 DIAGNOSIS — K5190 Ulcerative colitis, unspecified, without complications: Secondary | ICD-10-CM | POA: Diagnosis not present

## 2023-08-17 DIAGNOSIS — I2699 Other pulmonary embolism without acute cor pulmonale: Secondary | ICD-10-CM

## 2023-08-17 DIAGNOSIS — I8290 Acute embolism and thrombosis of unspecified vein: Secondary | ICD-10-CM

## 2023-08-17 DIAGNOSIS — R222 Localized swelling, mass and lump, trunk: Secondary | ICD-10-CM | POA: Diagnosis not present

## 2023-08-17 DIAGNOSIS — J90 Pleural effusion, not elsewhere classified: Secondary | ICD-10-CM | POA: Diagnosis not present

## 2023-08-17 DIAGNOSIS — K449 Diaphragmatic hernia without obstruction or gangrene: Secondary | ICD-10-CM | POA: Diagnosis not present

## 2023-08-17 DIAGNOSIS — I2510 Atherosclerotic heart disease of native coronary artery without angina pectoris: Secondary | ICD-10-CM | POA: Diagnosis not present

## 2023-08-17 NOTE — Patient Instructions (Signed)
 VISIT SUMMARY:  You came in today for a follow-up regarding your blood clot management. We discussed your recent D-dimer test results, your current medication regimen, and how you have been feeling overall. You mentioned that the blood thinner medication is affecting your energy levels, but you are still able to stay active with some adjustments.  YOUR PLAN:  -VENOUS THROMBOEMBOLISM (VTE): Venous Thromboembolism (VTE) is a condition where blood clots form in the veins. Your recent D-dimer test was positive, indicating a high risk of recurrence if you stop taking blood thinners. You should continue taking your anticoagulation medication at 2.5 mg twice daily. We will repeat the D-dimer test in three months to reassess your condition. If the test is negative, we may consider stopping the medication and rechecking in one month. If the test is positive, you will need to continue the medication indefinitely.  INSTRUCTIONS:  Please continue taking your anticoagulation medication as prescribed. We will repeat the D-dimer test in three months to reassess your condition. If you experience any new or worsening symptoms, please contact our office.

## 2023-08-17 NOTE — Assessment & Plan Note (Addendum)
 Patient was diagnosed of unprovoked PE in January 2025.   Risk factors: Old age, IBS Has since been on Eliquis 5 mg twice daily.  Recently cut down to 2.5 mg twice daily No complications or falls. D-dimer at 3 months: Elevated Cancer screening: colo guard test: Negative  -Patient has no indication for thrombophilia testing since she is 87 years and has slight risk factors of IBS and old age. - Patient's Eliquis dose was cut down to prophylactic dose by cardiologist.  She is currently taking 2.5 mg twice daily.  Recommended to continue this. - Will repeat D-dimer testing at 50-month mark.  Patient is very motivated to stop anticoagulation.  If negative at that time, we will stop anticoagulation and repeat D-dimer test at 1 month after the fact.  If 2 tests are negative, will consider discontinuing anticoagulation.  If one of the test is positive, we will continue prophylactic dose anticoagulation for lifetime.  Return to clinic in 3 months with labs

## 2023-08-17 NOTE — Progress Notes (Signed)
 [facility]  HEMATOLOGY FOLLOW-UP VISIT  Lori Soto M, DO  REASON FOR FOLLOW-UP: Venous thromboembolism   ASSESSMENT & PLAN:  Patient is a 88 y.o. female following for unprovoked venous thromboembolism  Pulmonary embolism (HCC) Patient was diagnosed of unprovoked PE in January 2025.   Risk factors: Old age, IBS Has since been on Eliquis 5 mg twice daily.  Recently cut down to 2.5 mg twice daily No complications or falls. D-dimer at 3 months: Elevated Cancer screening: colo guard test: Negative  -Patient has no indication for thrombophilia testing since she is 87 years and has slight risk factors of IBS and old age. - Patient's Eliquis dose was cut down to prophylactic dose by cardiologist.  She is currently taking 2.5 mg twice daily.  Recommended to continue this. - Will repeat D-dimer testing at 57-month mark.  Patient is very motivated to stop anticoagulation.  If negative at that time, we will stop anticoagulation and repeat D-dimer test at 1 month after the fact.  If 2 tests are negative, will consider discontinuing anticoagulation.  If one of the test is positive, we will continue prophylactic dose anticoagulation for lifetime.  Return to clinic in 3 months with labs   Orders Placed This Encounter  Procedures   D-dimer, quantitative    Standing Status:   Future    Expected Date:   11/16/2023    Expiration Date:   08/16/2024    The total time spent in the appointment was 25 minutes encounter with patients including review of chart and various tests results, discussions about plan of care and coordination of care plan   All questions were answered. The patient knows to call the clinic with any problems, questions or concerns. No barriers to learning was detected.  Eduardo Grade, MD 4/17/20252:09 PM   SUMMARY OF HEMATOLOGIC HISTORY: 05/06/2023: 1st episode: B/L PE Presentation: Dyspnea Risk factors: IBD Current treatment:  Eliquis 2.5 mg BID.  Patient completed 3 months of Eliquis 5 mg twice daily and was cut to 2.5 mg twice daily by cardiology D-dimer at 64-months: Elevated   INTERVAL HISTORY: Lori Soto 88 y.o. female following for unprovoked venous thromboembolism. Her cardiologist recently adjusted her blood thinner dosage to 2.5 mg twice a day, which she is currently taking. The medication has been affecting her energy levels, and she states 'it's just taking my energy.' Despite this, she feels slightly better but not back to her normal state.  She remains active, engaging in activities such as yard work and [employer] duties, but notes that she needs to take breaks when feeling tired. She experiences knee pain, which she attributes to her history of playing softball until the age of 38.  No chest pain or breathing difficulties unless engaged in physical activities like pulling weeds, after which she needs to rest.  I have reviewed the past medical history, past surgical history, social history and family history with the patient   ALLERGIES:  is allergic to penicillins, covid-19 (mrna) vaccine, influenza virus vaccine, and tape.  MEDICATIONS:  Current Outpatient Medications  Medication Sig Dispense Refill   Acetaminophen (TYLENOL PO) Take 1 tablet by mouth daily as needed (pain, headache).     ALPHA LIPOIC ACID PO Take 1 capsule by mouth daily.     apixaban (ELIQUIS) 2.5 MG TABS tablet Take 1 tablet (2.5 mg total) by mouth 2 (two) times daily. 60 tablet 6   Cyanocobalamin (VITAMIN B 12 PO) Take 1 tablet  by mouth daily.     ferrous sulfate 325 (65 FE) MG tablet Take 1 tablet (325 mg total) by mouth daily with breakfast. 60 tablet 0   Mesalamine (ASACOL) 400 MG CPDR DR capsule TAKE 2 CAPSULES BY MOUTH DAILY (Patient taking differently: Take 400 mg by mouth daily.) 180 capsule 3   metoprolol tartrate (LOPRESSOR) 25 MG tablet Take 1 tablet (25 mg total) by mouth 2 (two) times daily. 90 tablet 3    Multiple Vitamins-Minerals (MULTIVITAMIN WOMEN 50+) TABS Take 1 tablet by mouth daily.     Multiple Vitamins-Minerals (ZINC PO) Take 1 tablet by mouth daily.     Nutritional Supplements (GRAPESEED EXTRACT PO) Take 1 capsule by mouth daily.     OVER THE COUNTER MEDICATION Take 1 capsule by mouth daily. OTC : CoQ10 + red yeast rice     pantoprazole (PROTONIX) 40 MG tablet Take 1 tablet (40 mg total) by mouth daily before breakfast. 60 tablet 0   Pyridoxine HCl (VITAMIN B-6 PO) Take 1 tablet by mouth daily.     Sodium Hyaluronate, oral, (HYALURONIC ACID PO) Take 1 capsule by mouth daily.     TURMERIC CURCUMIN PO Take 1 tablet by mouth 2 (two) times daily.     No current facility-administered medications for this visit.     REVIEW OF SYSTEMS:   Constitutional: Denies fevers, chills or night sweats Eyes: Denies blurriness of vision Ears, nose, mouth, throat, and face: Denies mucositis or sore throat Respiratory: Denies cough, dyspnea or wheezes Cardiovascular: Denies palpitation, chest discomfort or lower extremity swelling Gastrointestinal:  Denies nausea, heartburn or change in bowel habits Skin: Denies abnormal skin rashes Lymphatics: Denies new lymphadenopathy or easy bruising Neurological:Denies numbness, tingling or new weaknesses Behavioral/Psych: Mood is stable, no new changes  All other systems were reviewed with the patient and are negative.  PHYSICAL EXAMINATION:  GENERAL:alert, no distress and comfortable SKIN: skin color, texture, turgor are normal, no rashes or significant lesions LYMPH:  no palpable lymphadenopathy in the cervical, axillary or inguinal LUNGS: clear to auscultation and percussion with normal breathing effort HEART: regular rate & rhythm and no murmurs and no lower extremity edema ABDOMEN:abdomen soft, non-tender and normal bowel sounds Musculoskeletal:no cyanosis of digits and no clubbing, multiple varicose veins on bilateral legs NEURO: alert & oriented x  3 with fluent speech  LABORATORY DATA:  I have reviewed the data as listed  Lab Results  Component Value Date   WBC 5.0 07/10/2023   NEUTROABS 3.5 07/10/2023   HGB 14.6 07/10/2023   HCT 44.9 07/10/2023   MCV 90.5 07/10/2023   PLT 243 07/10/2023       Chemistry      Component Value Date/Time   NA 140 07/10/2023 1033   NA 144 06/29/2023 1055   K 4.1 07/10/2023 1033   CL 102 07/10/2023 1033   CO2 27 07/10/2023 1033   BUN 10 07/10/2023 1033   BUN 11 06/29/2023 1055   CREATININE 0.75 07/10/2023 1033   CREATININE 0.69 05/14/2013 1239      Component Value Date/Time   CALCIUM 9.4 07/10/2023 1033   ALKPHOS 45 06/29/2023 1055   AST 25 06/29/2023 1055   ALT 19 06/29/2023 1055   BILITOT 0.6 06/29/2023 1055      Latest Reference Range & Units 08/03/23 11:23  D-Dimer, Quant 0.00 - 0.50 ug/mL-FEU 0.86 (H)  (H): Data is abnormally high   Latest Reference Range & Units 08/10/23 14:05  Fecal Occult Blood, POC NEGATIVE  NEGATIVE

## 2023-08-29 ENCOUNTER — Ambulatory Visit

## 2023-08-29 DIAGNOSIS — R293 Abnormal posture: Secondary | ICD-10-CM | POA: Diagnosis not present

## 2023-08-29 DIAGNOSIS — M62830 Muscle spasm of back: Secondary | ICD-10-CM

## 2023-08-29 DIAGNOSIS — M4125 Other idiopathic scoliosis, thoracolumbar region: Secondary | ICD-10-CM | POA: Diagnosis not present

## 2023-08-29 NOTE — Therapy (Signed)
 OUTPATIENT PHYSICAL THERAPY THORACOLUMBAR TREATMENT   Patient Name: Lori Soto MRN: [id number] DOB:07/23/1935, 88 y.o., female Today's Date: 08/29/2023  END OF SESSION:  PT End of Session - 08/29/23 1553     Visit Number 3    Number of Visits 12    Date for PT Re-Evaluation 11/07/23    PT Start Time 1548    PT Stop Time 1632    PT Time Calculation (min) 44 min    Activity Tolerance Patient tolerated treatment well    Behavior During Therapy WFL for tasks assessed/performed               Past Medical History:  Diagnosis Date   Anemia    Arthritis    Cancer (HCC)    skin cancer on nose   Carpal tunnel syndrome, bilateral    Cataract    Esophagitis    GERD (gastroesophageal reflux disease)    H/O hiatal hernia    Osteopenia    Scoliosis    Shingles    SVT (supraventricular tachycardia) (HCC)    Ulcerative colitis    Past Surgical History:  Procedure Laterality Date   ABDOMINAL HYSTERECTOMY     BUNIONECTOMY     Bilateral   CARPAL TUNNEL RELEASE Left 03/10/2015   Procedure: LEFT CARPAL TUNNEL RELEASE;  Surgeon: Lyanne Sample, MD;  Location: [facility];  Service: Orthopedics;  Laterality: Left;   CARPAL TUNNEL RELEASE Right 10/31/2017   Procedure: RIGHT CARPAL TUNNEL RELEASE;  Surgeon: Lyanne Sample, MD;  Location: [facility];  Service: Orthopedics;  Laterality: Right;   CATARACT EXTRACTION W/PHACO  05/28/2012   Procedure: CATARACT EXTRACTION PHACO AND INTRAOCULAR LENS PLACEMENT (IOC);  Surgeon: Anner Kill, MD;  Location: [facility];  Service: Ophthalmology;  Laterality: Right;  CDE=12.84   CATARACT EXTRACTION W/PHACO  06/07/2012   Procedure: CATARACT EXTRACTION PHACO AND INTRAOCULAR LENS PLACEMENT (IOC);  Surgeon: Anner Kill, MD;  Location: [facility];  Service: Ophthalmology;  Laterality: Left;  CDE 17.60   COLONOSCOPY     COLONOSCOPY N/A 07/08/2015   Procedure: COLONOSCOPY;  Surgeon: Ruby Corporal, MD;  [facility]: [facility];  Service:  Endoscopy;  Laterality: N/A;  930   ESOPHAGEAL DILATION N/A 07/08/2015   Procedure: ESOPHAGEAL DILATION;  Surgeon: Ruby Corporal, MD;  Location: [facility];  Service: Endoscopy;  Laterality: N/A;   ESOPHAGOGASTRODUODENOSCOPY N/A 07/08/2015   Procedure: ESOPHAGOGASTRODUODENOSCOPY (EGD);  Surgeon: Ruby Corporal, MD;  Location: [facility];  Service: Endoscopy;  Laterality: N/A;   Hernia     Right inguinal   HERNIA REPAIR  1961   right inguinal hernia   UPPER GASTROINTESTINAL ENDOSCOPY     Patient Active Problem List   Diagnosis Date Noted   Hospital discharge follow-up 05/17/2023   Pulmonary embolism (HCC) 05/06/2023   Murmur 03/07/2023   History of supraventricular tachycardia 11/08/2021   Arthritis 06/13/2019   GERD (gastroesophageal reflux disease) 06/10/2019   SVT (supraventricular tachycardia) (HCC) 08/28/2018   Light headedness 05/09/2018   Carpal tunnel syndrome of right wrist 05/20/2015   Primary localized osteoarthrosis, hand 05/20/2015   Carpal tunnel syndrome on left 03/18/2015   Osteopenia of the elderly 02/19/2014   DDD (degenerative disc disease), lumbar 12/03/2013   Scoliosis 12/03/2013   Chronic low back pain 05/14/2013   IBS (irritable bowel syndrome) 11/08/2012   Hypertension 10/26/2011   Chronic ulcerative colitis (HCC) 03/23/2011   REFERRING PROVIDER: Vicky Grange DO  REFERRING DIAG: Other idiopathic scoliosis, thoracolumbar region.  Rationale for Evaluation and Treatment: Rehabilitation  THERAPY DIAG:  Muscle spasm of back  Abnormal posture  ONSET DATE: Many years.  SUBJECTIVE:                                                                                                                                                                                           SUBJECTIVE STATEMENT: Patient reports that she feels alright today.   PERTINENT HISTORY:  Scoliosis.  See above.    PAIN:  Are you having pain? Yes: NPRS scale: no pain score  provided. Pain location: LB, right > left and left hip.  .   Pain description: Ache. Aggravating factors: As above. Relieving factors: As above.  PRECAUTIONS: None  RED FLAGS: None   WEIGHT BEARING RESTRICTIONS: No  FALLS:  Has patient fallen in last 6 months? No  LIVING ENVIRONMENT: Lives with: lives alone Lives in: House/apartment Has following equipment at home: None  OCCUPATION: Retired but leads a very active life.   PLOF: Independent  PATIENT GOALS: Do more with less pain.   OBJECTIVE:  Note: Objective measures were completed at Evaluation unless otherwise noted.  PATIENT SURVEYS:  Modified Oswestry 8/50.   POSTURE: rounded shoulders, forward head, increased lumbar lordosis, increased thoracic kyphosis, and flexed trunk , T-L scoliosis, left shoulder depression.  PALPATION: CC today is palpable tenderness over her right lumbar musculature though she states some days "it switches". She is also over her left lateral hip musculature and greater trochanter.   Genu valgum, right > left.    LUMBAR ROM:   Full active lumbar flexion and extension to 0 degrees.    LOWER EXTREMITY MMT:    Normal LE strength.  LUMBAR SPECIAL TESTS:  (-) SLR testing.     GAIT: The patient walks in a flexed trunk posture and leftward lean.                                                                                                                         TODAY'S TREATMENT:  08/29/23 EXERCISE LOG  Exercise Repetitions and Resistance Comments  Nustep  L3 x 16.5 minutes    Rocker board  4 minutes   Seated hip ADD isometric  3 minutes w/ 5 second hold   Seated clams  Green t-band x 3 minutes   LAQ 2# x 25 reps each    Seated HS curl  Green t-band x 15 reps each    Seated slouch overcorrect  20 reps     Blank cell = exercise not performed today                                    08/15/23 EXERCISE LOG  Exercise Repetitions and Resistance  Comments  Nustep  L3 x 10 minutes   BP assessment and education 123/78    Seated hip ADD isometric  4 minutes w/ 5 second hold            Blank cell = exercise not performed today  Modalities: no adverse reaction to today's modalities  Date:  Unattended Estim: right lumbar paraspinals, pre mod @ 80-150 Hz, 15 mins, Pain and Tone Hot Pack: Lumbar, 15 mins, Pain and Tone  PATIENT EDUCATION:  Education details: benefits of exercise Person educated: patient Education method: verbal  Education comprehension: patient reported understanding   HOME EXERCISE PROGRAM:   ASSESSMENT:  CLINICAL IMPRESSION: Patient was introduced to multiple new interventions for improved lower extremity engagement with moderate difficulty. She required minimal cueing with today's new interventions for proper exercise performance. She experienced a mild increase in low back discomfort with today's standing interventions, but this did not inhibit her ability to complete any of today's other interventions. She reported feeling alright upon the conclusion of treatment. She continues to require skilled physical therapy to address her remaining impairments to maximize her functional mobility.   OBJECTIVE IMPAIRMENTS: Abnormal gait, decreased activity tolerance, decreased ROM, increased muscle spasms, postural dysfunction, and pain.   ACTIVITY LIMITATIONS: carrying, lifting, bending, sitting, standing, and locomotion level  PARTICIPATION LIMITATIONS: meal prep, cleaning, laundry, community activity, and yard work  PERSONAL FACTORS: Time since onset of injury/illness/exacerbation and 1 comorbidity: scoliosis  are also affecting patient's functional outcome.   REHAB POTENTIAL: Good-  CLINICAL DECISION MAKING: Evolving/moderate complexity  EVALUATION COMPLEXITY: Moderate   GOALS:  LONG TERM GOALS: Target date: 11/07/23.  Ind with a HEP.  Goal status: INITIAL  2.  Perform ADL's with pain not > 4/10.  Goal  status: INITIAL  3.  Sit 30 minutes with pain not > 4/10.  Goal status: INITIAL   PLAN:  PT FREQUENCY: 2x/week  PT DURATION: 6 weeks  PLANNED INTERVENTIONS: 97110-Therapeutic exercises, 97530- Therapeutic activity, [id number]- Neuromuscular re-education, 97535- Self Care, 21194- Manual therapy, G0283- Electrical stimulation (unattended), 97035- Ultrasound, 17408- Traction (mechanical), Dry Needling, Cryotherapy, and Moist heat.  PLAN FOR NEXT SESSION: Combo e'stim/US  at 1.50 W/CM2, STW/M, core exercise progression, spinal protection techniques and body mechanics training.  Intermittent traction beginning at 35% body weight.   Lane Pinon, PT 08/29/2023, 5:38 PM

## 2023-09-05 ENCOUNTER — Encounter: Admitting: *Deleted

## 2023-09-07 ENCOUNTER — Encounter: Payer: Self-pay | Admitting: Physical Therapy

## 2023-09-07 ENCOUNTER — Ambulatory Visit: Attending: Family Medicine | Admitting: Physical Therapy

## 2023-09-07 DIAGNOSIS — R293 Abnormal posture: Secondary | ICD-10-CM | POA: Insufficient documentation

## 2023-09-07 DIAGNOSIS — M62830 Muscle spasm of back: Secondary | ICD-10-CM | POA: Insufficient documentation

## 2023-09-07 NOTE — Therapy (Signed)
 OUTPATIENT PHYSICAL THERAPY THORACOLUMBAR TREATMENT   Patient Name: Lori Soto MRN: [id number] DOB:1935/09/11, 88 y.o., female Today's Date: 09/07/2023  END OF SESSION:  PT End of Session - 09/07/23 1545     Visit Number 4    Number of Visits 12    Date for PT Re-Evaluation 11/07/23    PT Start Time 0153   Running late for treatment.   PT Stop Time 0230    PT Time Calculation (min) 37 min    Activity Tolerance Patient tolerated treatment well    Behavior During Therapy WFL for tasks assessed/performed               Past Medical History:  Diagnosis Date   Anemia    Arthritis    Cancer (HCC)    skin cancer on nose   Carpal tunnel syndrome, bilateral    Cataract    Esophagitis    GERD (gastroesophageal reflux disease)    H/O hiatal hernia    Osteopenia    Scoliosis    Shingles    SVT (supraventricular tachycardia) (HCC)    Ulcerative colitis    Past Surgical History:  Procedure Laterality Date   ABDOMINAL HYSTERECTOMY     BUNIONECTOMY     Bilateral   CARPAL TUNNEL RELEASE Left 03/10/2015   Procedure: LEFT CARPAL TUNNEL RELEASE;  Surgeon: Lyanne Sample, MD;  Location: [facility];  Service: Orthopedics;  Laterality: Left;   CARPAL TUNNEL RELEASE Right 10/31/2017   Procedure: RIGHT CARPAL TUNNEL RELEASE;  Surgeon: Lyanne Sample, MD;  Location: [facility];  Service: Orthopedics;  Laterality: Right;   CATARACT EXTRACTION W/PHACO  05/28/2012   Procedure: CATARACT EXTRACTION PHACO AND INTRAOCULAR LENS PLACEMENT (IOC);  Surgeon: Anner Kill, MD;  Location: [facility];  Service: Ophthalmology;  Laterality: Right;  CDE=12.84   CATARACT EXTRACTION W/PHACO  06/07/2012   Procedure: CATARACT EXTRACTION PHACO AND INTRAOCULAR LENS PLACEMENT (IOC);  Surgeon: Anner Kill, MD;  Location: [facility];  Service: Ophthalmology;  Laterality: Left;  CDE 17.60   COLONOSCOPY     COLONOSCOPY N/A 07/08/2015   Procedure: COLONOSCOPY;  Surgeon: Ruby Corporal, MD;  [facility];  Service: Endoscopy;  Laterality: N/A;  930   ESOPHAGEAL DILATION N/A 07/08/2015   Procedure: ESOPHAGEAL DILATION;  Surgeon: Ruby Corporal, MD;  [facility]: [facility];  Service: Endoscopy;  Laterality: N/A;   ESOPHAGOGASTRODUODENOSCOPY N/A 07/08/2015   Procedure: ESOPHAGOGASTRODUODENOSCOPY (EGD);  Surgeon: Ruby Corporal, MD;  [facility]: [facility];  Service: Endoscopy;  Laterality: N/A;   Hernia     Right inguinal   HERNIA REPAIR  1961   right inguinal hernia   UPPER GASTROINTESTINAL ENDOSCOPY     Patient Active Problem List   Diagnosis Date Noted   Hospital discharge follow-up 05/17/2023   Pulmonary embolism (HCC) 05/06/2023   Murmur 03/07/2023   History of supraventricular tachycardia 11/08/2021   Arthritis 06/13/2019   GERD (gastroesophageal reflux disease) 06/10/2019   SVT (supraventricular tachycardia) (HCC) 08/28/2018   Light headedness 05/09/2018   Carpal tunnel syndrome of right wrist 05/20/2015   Primary localized osteoarthrosis, hand 05/20/2015   Carpal tunnel syndrome on left 03/18/2015   Osteopenia of the elderly 02/19/2014   DDD (degenerative disc disease), lumbar 12/03/2013   Scoliosis 12/03/2013   Chronic low back pain 05/14/2013   IBS (irritable bowel syndrome) 11/08/2012   Hypertension 10/26/2011   Chronic ulcerative colitis (HCC) 03/23/2011   REFERRING PROVIDER: Vicky Grange DO  REFERRING DIAG:  Other idiopathic scoliosis, thoracolumbar region.  Rationale for Evaluation and Treatment: Rehabilitation  THERAPY DIAG:  Muscle spasm of back  Abnormal posture  ONSET DATE: Many years.  SUBJECTIVE:                                                                                                                                                                                           SUBJECTIVE STATEMENT: Doing okay.  PERTINENT HISTORY:  Scoliosis.  See above.    PAIN:  Are you having pain? Yes: NPRS scale: no pain score provided. Pain  location: LB, right > left and left hip.  .   Pain description: Ache. Aggravating factors: As above. Relieving factors: As above.  PRECAUTIONS: None  RED FLAGS: None   WEIGHT BEARING RESTRICTIONS: No  FALLS:  Has patient fallen in last 6 months? No  LIVING ENVIRONMENT: Lives with: lives alone Lives in: House/apartment Has following equipment at home: None  OCCUPATION: Retired but leads a very active life.   PLOF: Independent  PATIENT GOALS: Do more with less pain.   OBJECTIVE:  Note: Objective measures were completed at Evaluation unless otherwise noted.  PATIENT SURVEYS:  Modified Oswestry 8/50.   POSTURE: rounded shoulders, forward head, increased lumbar lordosis, increased thoracic kyphosis, and flexed trunk , T-L scoliosis, left shoulder depression.  PALPATION: CC today is palpable tenderness over her right lumbar musculature though she states some days "it switches". She is also over her left lateral hip musculature and greater trochanter.   Genu valgum, right > left.    LUMBAR ROM:   Full active lumbar flexion and extension to 0 degrees.    LOWER EXTREMITY MMT:    Normal LE strength.  LUMBAR SPECIAL TESTS:  (-) SLR testing.     GAIT: The patient walks in a flexed trunk posture and leftward lean.                                                                                                                         TODAY'S TREATMENT:  09/07/23:  Patient in left sdly position and received STW/M x 8 minutes to  her right lower lumbar musculature f/b  HMP and IFC at 80-150 Hz on 40% scan x 20 minutes.  Normal modality response following removal of modality.                                     08/29/23 EXERCISE LOG  Exercise Repetitions and Resistance Comments  Nustep  L3 x 16.5 minutes    Rocker board  4 minutes   Seated hip ADD isometric  3 minutes w/ 5 second hold   Seated clams  Green t-band x 3 minutes   LAQ 2# x 25 reps each    Seated HS curl   Green t-band x 15 reps each    Seated slouch overcorrect  20 reps     Blank cell = exercise not performed today                                    08/15/23 EXERCISE LOG  Exercise Repetitions and Resistance Comments  Nustep  L3 x 10 minutes   BP assessment and education 123/78    Seated hip ADD isometric  4 minutes w/ 5 second hold            Blank cell = exercise not performed today  Modalities: no adverse reaction to today's modalities  Date:  Unattended Estim: right lumbar paraspinals, pre mod @ 80-150 Hz, 15 mins, Pain and Tone Hot Pack: Lumbar, 15 mins, Pain and Tone  PATIENT EDUCATION:  Education details: benefits of exercise Person educated: patient Education method: verbal  Education comprehension: patient reported understanding   HOME EXERCISE PROGRAM:   ASSESSMENT:  CLINICAL IMPRESSION: Patient did well with STW/M and electrical stimulation to her lower lumbar region and felt better following.    OBJECTIVE IMPAIRMENTS: Abnormal gait, decreased activity tolerance, decreased ROM, increased muscle spasms, postural dysfunction, and pain.   ACTIVITY LIMITATIONS: carrying, lifting, bending, sitting, standing, and locomotion level  PARTICIPATION LIMITATIONS: meal prep, cleaning, laundry, community activity, and yard work  PERSONAL FACTORS: Time since onset of injury/illness/exacerbation and 1 comorbidity: scoliosis are also affecting patient's functional outcome.   REHAB POTENTIAL: Good-  CLINICAL DECISION MAKING: Evolving/moderate complexity  EVALUATION COMPLEXITY: Moderate   GOALS:  LONG TERM GOALS: Target date: 11/07/23.  Ind with a HEP.  Goal status: INITIAL  2.  Perform ADL's with pain not > 4/10.  Goal status: INITIAL  3.  Sit 30 minutes with pain not > 4/10.  Goal status: INITIAL   PLAN:  PT FREQUENCY: 2x/week  PT DURATION: 6 weeks  PLANNED INTERVENTIONS: 97110-Therapeutic exercises, 97530- Therapeutic activity, [id number]- Neuromuscular  re-education, 97535- Self Care, 82956- Manual therapy, G0283- Electrical stimulation (unattended), 97035- Ultrasound, 21308- Traction (mechanical), Dry Needling, Cryotherapy, and Moist heat.  PLAN FOR NEXT SESSION: Combo e'stim/US  at 1.50 W/CM2, STW/M, core exercise progression, spinal protection techniques and body mechanics training.  Intermittent traction beginning at 35% body weight.   Warner [place], [place], PT 09/07/2023, 3:51 PM

## 2023-09-11 ENCOUNTER — Ambulatory Visit: Admitting: Physical Therapy

## 2023-09-11 DIAGNOSIS — R293 Abnormal posture: Secondary | ICD-10-CM

## 2023-09-11 DIAGNOSIS — M62830 Muscle spasm of back: Secondary | ICD-10-CM | POA: Diagnosis not present

## 2023-09-11 NOTE — Therapy (Addendum)
 OUTPATIENT PHYSICAL THERAPY THORACOLUMBAR TREATMENT   Patient Name: Lori Soto MRN: [id number] DOB:1935-09-23, 88 y.o., female Today's Date: 09/11/2023  END OF SESSION:  PT End of Session - 09/11/23 1546     Visit Number 5    Number of Visits 12    Date for PT Re-Evaluation 11/07/23    PT Start Time 0305    PT Stop Time 0403    PT Time Calculation (min) 58 min    Activity Tolerance Patient tolerated treatment well    Behavior During Therapy WFL for tasks assessed/performed                Past Medical History:  Diagnosis Date   Anemia    Arthritis    Cancer (HCC)    skin cancer on nose   Carpal tunnel syndrome, bilateral    Cataract    Esophagitis    GERD (gastroesophageal reflux disease)    H/O hiatal hernia    Osteopenia    Scoliosis    Shingles    SVT (supraventricular tachycardia) (HCC)    Ulcerative colitis    Past Surgical History:  Procedure Laterality Date   ABDOMINAL HYSTERECTOMY     BUNIONECTOMY     Bilateral   CARPAL TUNNEL RELEASE Left 03/10/2015   Procedure: LEFT CARPAL TUNNEL RELEASE;  Surgeon: Lyanne Sample, MD;  Location: [facility];  Service: Orthopedics;  Laterality: Left;   CARPAL TUNNEL RELEASE Right 10/31/2017   Procedure: RIGHT CARPAL TUNNEL RELEASE;  Surgeon: Lyanne Sample, MD;  Location: [facility];  Service: Orthopedics;  Laterality: Right;   CATARACT EXTRACTION W/PHACO  05/28/2012   Procedure: CATARACT EXTRACTION PHACO AND INTRAOCULAR LENS PLACEMENT (IOC);  Surgeon: Anner Kill, MD;  Location: [facility];  Service: Ophthalmology;  Laterality: Right;  CDE=12.84   CATARACT EXTRACTION W/PHACO  06/07/2012   Procedure: CATARACT EXTRACTION PHACO AND INTRAOCULAR LENS PLACEMENT (IOC);  Surgeon: Anner Kill, MD;  Location: [facility];  Service: Ophthalmology;  Laterality: Left;  CDE 17.60   COLONOSCOPY     COLONOSCOPY N/A 07/08/2015   Procedure: COLONOSCOPY;  Surgeon: Ruby Corporal, MD;  Location: [facility];  Service:  Endoscopy;  Laterality: N/A;  930   ESOPHAGEAL DILATION N/A 07/08/2015   Procedure: ESOPHAGEAL DILATION;  Surgeon: Ruby Corporal, MD;  Location: [facility];  Service: Endoscopy;  Laterality: N/A;   ESOPHAGOGASTRODUODENOSCOPY N/A 07/08/2015   Procedure: ESOPHAGOGASTRODUODENOSCOPY (EGD);  Surgeon: Ruby Corporal, MD;  Location: [facility];  Service: Endoscopy;  Laterality: N/A;   Hernia     Right inguinal   HERNIA REPAIR  1961   right inguinal hernia   UPPER GASTROINTESTINAL ENDOSCOPY     Patient Active Problem List   Diagnosis Date Noted   Hospital discharge follow-up 05/17/2023   Pulmonary embolism (HCC) 05/06/2023   Murmur 03/07/2023   History of supraventricular tachycardia 11/08/2021   Arthritis 06/13/2019   GERD (gastroesophageal reflux disease) 06/10/2019   SVT (supraventricular tachycardia) (HCC) 08/28/2018   Light headedness 05/09/2018   Carpal tunnel syndrome of right wrist 05/20/2015   Primary localized osteoarthrosis, hand 05/20/2015   Carpal tunnel syndrome on left 03/18/2015   Osteopenia of the elderly 02/19/2014   DDD (degenerative disc disease), lumbar 12/03/2013   Scoliosis 12/03/2013   Chronic low back pain 05/14/2013   IBS (irritable bowel syndrome) 11/08/2012   Hypertension 10/26/2011   Chronic ulcerative colitis (HCC) 03/23/2011   REFERRING PROVIDER: Vicky Grange DO  REFERRING DIAG: Other idiopathic scoliosis, thoracolumbar  region.  Rationale for Evaluation and Treatment: Rehabilitation  THERAPY DIAG:  Muscle spasm of back  Abnormal posture  ONSET DATE: Many years.  SUBJECTIVE:                                                                                                                                                                                           SUBJECTIVE STATEMENT: Last session helped.  PERTINENT HISTORY:  Scoliosis.  See above.    PAIN:  Are you having pain? Yes: NPRS scale: no pain score provided. Pain location: LB,  right > left and left hip.  .   Pain description: Ache. Aggravating factors: As above. Relieving factors: As above.  PRECAUTIONS: None  RED FLAGS: None   WEIGHT BEARING RESTRICTIONS: No  FALLS:  Has patient fallen in last 6 months? No  LIVING ENVIRONMENT: Lives with: lives alone Lives in: House/apartment Has following equipment at home: None  OCCUPATION: Retired but leads a very active life.   PLOF: Independent  PATIENT GOALS: Do more with less pain.   OBJECTIVE:  Note: Objective measures were completed at Evaluation unless otherwise noted.  PATIENT SURVEYS:  Modified Oswestry 8/50.   POSTURE: rounded shoulders, forward head, increased lumbar lordosis, increased thoracic kyphosis, and flexed trunk , T-L scoliosis, left shoulder depression.  PALPATION: CC today is palpable tenderness over her right lumbar musculature though she states some days "it switches". She is also over her left lateral hip musculature and greater trochanter.   Genu valgum, right > left.    LUMBAR ROM:   Full active lumbar flexion and extension to 0 degrees.    LOWER EXTREMITY MMT:    Normal LE strength.  LUMBAR SPECIAL TESTS:  (-) SLR testing.     GAIT: The patient walks in a flexed trunk posture and leftward lean.                                                                                                                         TODAY'S TREATMENT:  09/11/23:  In prone over pillow:  STW/M x 24 minutes to patient's bilateral lumbar musculature f/b  HMP x 20 minutes at 80-150 Hz on 40% scan in supine with LE's on leg elevator for comfort.    09/07/23:  Patient in left sdly position and received STW/M x 8 minutes to her right lower lumbar musculature f/b  HMP and IFC at 80-150 Hz on 40% scan x 20 minutes.  Normal modality response following removal of modality.                                     08/29/23 EXERCISE LOG  Exercise Repetitions and Resistance Comments  Nustep  L3 x 16.5  minutes    Rocker board  4 minutes   Seated hip ADD isometric  3 minutes w/ 5 second hold   Seated clams  Green t-band x 3 minutes   LAQ 2# x 25 reps each    Seated HS curl  Green t-band x 15 reps each    Seated slouch overcorrect  20 reps     Blank cell = exercise not performed today                                    08/15/23 EXERCISE LOG  Exercise Repetitions and Resistance Comments  Nustep  L3 x 10 minutes   BP assessment and education 123/78    Seated hip ADD isometric  4 minutes w/ 5 second hold            Blank cell = exercise not performed today  Modalities: no adverse reaction to today's modalities  Date:  Unattended Estim: right lumbar paraspinals, pre mod @ 80-150 Hz, 15 mins, Pain and Tone Hot Pack: Lumbar, 15 mins, Pain and Tone  PATIENT EDUCATION:  Education details: benefits of exercise Person educated: patient Education method: verbal  Education comprehension: patient reported understanding   HOME EXERCISE PROGRAM:   ASSESSMENT:  CLINICAL IMPRESSION: Patient with a great deal of tone over her lumbar musculature, right > left.  Patient stating feeling "great" after treatment. OBJECTIVE IMPAIRMENTS: Abnormal gait, decreased activity tolerance, decreased ROM, increased muscle spasms, postural dysfunction, and pain.   ACTIVITY LIMITATIONS: carrying, lifting, bending, sitting, standing, and locomotion level  PARTICIPATION LIMITATIONS: meal prep, cleaning, laundry, community activity, and yard work  PERSONAL FACTORS: Time since onset of injury/illness/exacerbation and 1 comorbidity: scoliosis are also affecting patient's functional outcome.   REHAB POTENTIAL: Good-  CLINICAL DECISION MAKING: Evolving/moderate complexity  EVALUATION COMPLEXITY: Moderate   GOALS:  LONG TERM GOALS: Target date: 11/07/23.  Ind with a HEP.  Goal status: INITIAL  2.  Perform ADL's with pain not > 4/10.  Goal status: INITIAL  3.  Sit 30 minutes with pain not > 4/10.   Goal status: INITIAL   PLAN:  PT FREQUENCY: 2x/week  PT DURATION: 6 weeks  PLANNED INTERVENTIONS: 97110-Therapeutic exercises, 97530- Therapeutic activity, [id number]- Neuromuscular re-education, 97535- Self Care, 30865- Manual therapy, G0283- Electrical stimulation (unattended), 97035- Ultrasound, 78469- Traction (mechanical), Dry Needling, Cryotherapy, and Moist heat.  PLAN FOR NEXT SESSION: Combo e'stim/US  at 1.50 W/CM2, STW/M, core exercise progression, spinal protection techniques and body mechanics training.  Intermittent traction beginning at 35% body weight.   Ashten Sarnowski, [place], PT 09/11/2023, 4:23 PM

## 2023-09-15 ENCOUNTER — Telehealth: Payer: Self-pay

## 2023-09-15 NOTE — Telephone Encounter (Signed)
 Last iron  levels checked were in Feb 2025. PT is on ferrous sulfate  would you like her to continue taking or go off? Pt aware you are not in office.

## 2023-09-15 NOTE — Telephone Encounter (Signed)
 Copied from CRM [id number]. Topic: Clinical - Medication Question >> Sep 15, 2023  1:10 PM Retta Caster wrote: Reason for CRM: Multiple Vitamins-Minerals (ZINC PO)  Patient needs call back on if she should still take this due to her iron  level os back to normal. [phone] or MyChart

## 2023-09-18 ENCOUNTER — Ambulatory Visit: Admitting: Physical Therapy

## 2023-09-18 DIAGNOSIS — R293 Abnormal posture: Secondary | ICD-10-CM | POA: Diagnosis not present

## 2023-09-18 DIAGNOSIS — M62830 Muscle spasm of back: Secondary | ICD-10-CM

## 2023-09-18 NOTE — Therapy (Signed)
 OUTPATIENT PHYSICAL THERAPY THORACOLUMBAR TREATMENT   Patient Name: Lori Soto MRN: [id number] DOB:12-18-1935, 88 y.o., female Today's Date: 09/18/2023  END OF SESSION:  PT End of Session - 09/18/23 1422     Visit Number 6    Number of Visits 12    Date for PT Re-Evaluation 11/07/23    PT Start Time 0143    PT Stop Time 0240    PT Time Calculation (min) 57 min    Activity Tolerance Patient tolerated treatment well    Behavior During Therapy WFL for tasks assessed/performed                Past Medical History:  Diagnosis Date   Anemia    Arthritis    Cancer (HCC)    skin cancer on nose   Carpal tunnel syndrome, bilateral    Cataract    Esophagitis    GERD (gastroesophageal reflux disease)    H/O hiatal hernia    Osteopenia    Scoliosis    Shingles    SVT (supraventricular tachycardia) (HCC)    Ulcerative colitis    Past Surgical History:  Procedure Laterality Date   ABDOMINAL HYSTERECTOMY     BUNIONECTOMY     Bilateral   CARPAL TUNNEL RELEASE Left 03/10/2015   Procedure: LEFT CARPAL TUNNEL RELEASE;  Surgeon: Lyanne Sample, MD;  Location: [facility];  Service: Orthopedics;  Laterality: Left;   CARPAL TUNNEL RELEASE Right 10/31/2017   Procedure: RIGHT CARPAL TUNNEL RELEASE;  Surgeon: Lyanne Sample, MD;  Location: [facility];  Service: Orthopedics;  Laterality: Right;   CATARACT EXTRACTION W/PHACO  05/28/2012   Procedure: CATARACT EXTRACTION PHACO AND INTRAOCULAR LENS PLACEMENT (IOC);  Surgeon: Anner Kill, MD;  Location: [facility];  Service: Ophthalmology;  Laterality: Right;  CDE=12.84   CATARACT EXTRACTION W/PHACO  06/07/2012   Procedure: CATARACT EXTRACTION PHACO AND INTRAOCULAR LENS PLACEMENT (IOC);  Surgeon: Anner Kill, MD;  Location: [facility];  Service: Ophthalmology;  Laterality: Left;  CDE 17.60   COLONOSCOPY     COLONOSCOPY N/A 07/08/2015   Procedure: COLONOSCOPY;  Surgeon: Ruby Corporal, MD;  Location: [facility];  Service:  Endoscopy;  Laterality: N/A;  930   ESOPHAGEAL DILATION N/A 07/08/2015   Procedure: ESOPHAGEAL DILATION;  Surgeon: Ruby Corporal, MD;  Location: [facility];  Service: Endoscopy;  Laterality: N/A;   ESOPHAGOGASTRODUODENOSCOPY N/A 07/08/2015   Procedure: ESOPHAGOGASTRODUODENOSCOPY (EGD);  Surgeon: Ruby Corporal, MD;  Location: [facility];  Service: Endoscopy;  Laterality: N/A;   Hernia     Right inguinal   HERNIA REPAIR  1961   right inguinal hernia   UPPER GASTROINTESTINAL ENDOSCOPY     Patient Active Problem List   Diagnosis Date Noted   Hospital discharge follow-up 05/17/2023   Pulmonary embolism (HCC) 05/06/2023   Murmur 03/07/2023   History of supraventricular tachycardia 11/08/2021   Arthritis 06/13/2019   GERD (gastroesophageal reflux disease) 06/10/2019   SVT (supraventricular tachycardia) (HCC) 08/28/2018   Light headedness 05/09/2018   Carpal tunnel syndrome of right wrist 05/20/2015   Primary localized osteoarthrosis, hand 05/20/2015   Carpal tunnel syndrome on left 03/18/2015   Osteopenia of the elderly 02/19/2014   DDD (degenerative disc disease), lumbar 12/03/2013   Scoliosis 12/03/2013   Chronic low back pain 05/14/2013   IBS (irritable bowel syndrome) 11/08/2012   Hypertension 10/26/2011   Chronic ulcerative colitis (HCC) 03/23/2011   REFERRING PROVIDER: Vicky Grange DO  REFERRING DIAG: Other idiopathic scoliosis, thoracolumbar  region.  Rationale for Evaluation and Treatment: Rehabilitation  THERAPY DIAG:  Muscle spasm of back  Abnormal posture  ONSET DATE: Many years.  SUBJECTIVE:                                                                                                                                                                                           SUBJECTIVE STATEMENT: Felt good after lest session.  PERTINENT HISTORY:  Scoliosis.  See above.    PAIN:  Are you having pain? Yes: NPRS scale: 3-4/10. Pain location: LB, right  > left and left hip.  .   Pain description: Ache. Aggravating factors: As above. Relieving factors: As above.  PRECAUTIONS: None  RED FLAGS: None   WEIGHT BEARING RESTRICTIONS: No  FALLS:  Has patient fallen in last 6 months? No  LIVING ENVIRONMENT: Lives with: lives alone Lives in: House/apartment Has following equipment at home: None  OCCUPATION: Retired but leads a very active life.   PLOF: Independent  PATIENT GOALS: Do more with less pain.   OBJECTIVE:  Note: Objective measures were completed at Evaluation unless otherwise noted.  PATIENT SURVEYS:  Modified Oswestry 8/50.   POSTURE: rounded shoulders, forward head, increased lumbar lordosis, increased thoracic kyphosis, and flexed trunk , T-L scoliosis, left shoulder depression.  PALPATION: CC today is palpable tenderness over her right lumbar musculature though she states some days "it switches". She is also over her left lateral hip musculature and greater trochanter.   Genu valgum, right > left.    LUMBAR ROM:   Full active lumbar flexion and extension to 0 degrees.    LOWER EXTREMITY MMT:    Normal LE strength.  LUMBAR SPECIAL TESTS:  (-) SLR testing.     GAIT: The patient walks in a flexed trunk posture and leftward lean.                                                                                                                         TODAY'S TREATMENT:  09/18/23:  In prone over pillow:  STW/M x 24 minutes to patient's bilateral lumbar musculature f/b HMP  x 20 minutes at 80-150 Hz on 40% scan in supine with LE's on leg elevator for comfort.     09/11/23:  In prone over pillow:  STW/M x 24 minutes to patient's bilateral lumbar musculature f/b HMP x 20 minutes at 80-150 Hz on 40% scan in supine with LE's on leg elevator for comfort.    09/07/23:  Patient in left sdly position and received STW/M x 8 minutes to her right lower lumbar musculature f/b  HMP and IFC at 80-150 Hz on 40% scan x 20  minutes.  Normal modality response following removal of modality.                                     08/29/23 EXERCISE LOG  Exercise Repetitions and Resistance Comments  Nustep  L3 x 16.5 minutes    Rocker board  4 minutes   Seated hip ADD isometric  3 minutes w/ 5 second hold   Seated clams  Green t-band x 3 minutes   LAQ 2# x 25 reps each    Seated HS curl  Green t-band x 15 reps each    Seated slouch overcorrect  20 reps     Blank cell = exercise not performed today                                    08/15/23 EXERCISE LOG  Exercise Repetitions and Resistance Comments  Nustep  L3 x 10 minutes   BP assessment and education 123/78    Seated hip ADD isometric  4 minutes w/ 5 second hold            Blank cell = exercise not performed today  Modalities: no adverse reaction to today's modalities  Date:  Unattended Estim: right lumbar paraspinals, pre mod @ 80-150 Hz, 15 mins, Pain and Tone Hot Pack: Lumbar, 15 mins, Pain and Tone  PATIENT EDUCATION:  Education details: benefits of exercise Person educated: patient Education method: verbal  Education comprehension: patient reported understanding   HOME EXERCISE PROGRAM:   ASSESSMENT:  CLINICAL IMPRESSION: Patient responded very well to last treatment with a very good response to STW/M.  Performed STW/M again today with good result.   OBJECTIVE IMPAIRMENTS: Abnormal gait, decreased activity tolerance, decreased ROM, increased muscle spasms, postural dysfunction, and pain.   ACTIVITY LIMITATIONS: carrying, lifting, bending, sitting, standing, and locomotion level  PARTICIPATION LIMITATIONS: meal prep, cleaning, laundry, community activity, and yard work  PERSONAL FACTORS: Time since onset of injury/illness/exacerbation and 1 comorbidity: scoliosis are also affecting patient's functional outcome.   REHAB POTENTIAL: Good-  CLINICAL DECISION MAKING: Evolving/moderate complexity  EVALUATION COMPLEXITY:  Moderate   GOALS:  LONG TERM GOALS: Target date: 11/07/23.  Ind with a HEP.  Goal status: INITIAL  2.  Perform ADL's with pain not > 4/10.  Goal status: INITIAL  3.  Sit 30 minutes with pain not > 4/10.  Goal status: INITIAL   PLAN:  PT FREQUENCY: 2x/week  PT DURATION: 6 weeks  PLANNED INTERVENTIONS: 97110-Therapeutic exercises, 97530- Therapeutic activity, [id number]- Neuromuscular re-education, 97535- Self Care, 19147- Manual therapy, G0283- Electrical stimulation (unattended), 97035- Ultrasound, 82956- Traction (mechanical), Dry Needling, Cryotherapy, and Moist heat.  PLAN FOR NEXT SESSION: Combo e'stim/US  at 1.50 W/CM2, STW/M, core exercise progression, spinal protection techniques and body mechanics training.  Intermittent traction beginning at 35%  body weight.   Elmin Wiederholt, [place], PT 09/18/2023, 2:57 PM

## 2023-09-18 NOTE — Telephone Encounter (Signed)
Left message making pt aware and to call back if needed.

## 2023-09-18 NOTE — Telephone Encounter (Signed)
 Ok to stop for now and we recheck levels at her next OV.

## 2023-09-21 DIAGNOSIS — H353112 Nonexudative age-related macular degeneration, right eye, intermediate dry stage: Secondary | ICD-10-CM | POA: Diagnosis not present

## 2023-09-26 ENCOUNTER — Ambulatory Visit: Admitting: *Deleted

## 2023-09-26 ENCOUNTER — Encounter: Payer: Self-pay | Admitting: *Deleted

## 2023-09-26 DIAGNOSIS — R293 Abnormal posture: Secondary | ICD-10-CM

## 2023-09-26 DIAGNOSIS — M62830 Muscle spasm of back: Secondary | ICD-10-CM

## 2023-09-26 NOTE — Therapy (Signed)
 OUTPATIENT PHYSICAL THERAPY THORACOLUMBAR TREATMENT   Patient Name: Lori Soto MRN: [id number] DOB:Dec 20, 1935, 88 y.o., female Today's Date: 09/26/2023  END OF SESSION:  PT End of Session - 09/26/23 1355     Visit Number 7    Number of Visits 12    Date for PT Re-Evaluation 11/07/23    PT Start Time 1355    PT Stop Time 1443    PT Time Calculation (min) 48 min                Past Medical History:  Diagnosis Date   Anemia    Arthritis    Cancer (HCC)    skin cancer on nose   Carpal tunnel syndrome, bilateral    Cataract    Esophagitis    GERD (gastroesophageal reflux disease)    H/O hiatal hernia    Osteopenia    Scoliosis    Shingles    SVT (supraventricular tachycardia) (HCC)    Ulcerative colitis    Past Surgical History:  Procedure Laterality Date   ABDOMINAL HYSTERECTOMY     BUNIONECTOMY     Bilateral   CARPAL TUNNEL RELEASE Left 03/10/2015   Procedure: LEFT CARPAL TUNNEL RELEASE;  Surgeon: Lyanne Sample, MD;  Location: [facility];  Service: Orthopedics;  Laterality: Left;   CARPAL TUNNEL RELEASE Right 10/31/2017   Procedure: RIGHT CARPAL TUNNEL RELEASE;  Surgeon: Lyanne Sample, MD;  Location: [facility];  Service: Orthopedics;  Laterality: Right;   CATARACT EXTRACTION W/PHACO  05/28/2012   Procedure: CATARACT EXTRACTION PHACO AND INTRAOCULAR LENS PLACEMENT (IOC);  Surgeon: Anner Kill, MD;  Location: [facility];  Service: Ophthalmology;  Laterality: Right;  CDE=12.84   CATARACT EXTRACTION W/PHACO  06/07/2012   Procedure: CATARACT EXTRACTION PHACO AND INTRAOCULAR LENS PLACEMENT (IOC);  Surgeon: Anner Kill, MD;  Location: [facility];  Service: Ophthalmology;  Laterality: Left;  CDE 17.60   COLONOSCOPY     COLONOSCOPY N/A 07/08/2015   Procedure: COLONOSCOPY;  Surgeon: Ruby Corporal, MD;  Location: [facility];  Service: Endoscopy;  Laterality: N/A;  930   ESOPHAGEAL DILATION N/A 07/08/2015   Procedure: ESOPHAGEAL DILATION;  Surgeon:  Ruby Corporal, MD;  Location: [facility];  Service: Endoscopy;  Laterality: N/A;   ESOPHAGOGASTRODUODENOSCOPY N/A 07/08/2015   Procedure: ESOPHAGOGASTRODUODENOSCOPY (EGD);  Surgeon: Ruby Corporal, MD;  Location: [facility];  Service: Endoscopy;  Laterality: N/A;   Hernia     Right inguinal   HERNIA REPAIR  1961   right inguinal hernia   UPPER GASTROINTESTINAL ENDOSCOPY     Patient Active Problem List   Diagnosis Date Noted   Hospital discharge follow-up 05/17/2023   Pulmonary embolism (HCC) 05/06/2023   Murmur 03/07/2023   History of supraventricular tachycardia 11/08/2021   Arthritis 06/13/2019   GERD (gastroesophageal reflux disease) 06/10/2019   SVT (supraventricular tachycardia) (HCC) 08/28/2018   Light headedness 05/09/2018   Carpal tunnel syndrome of right wrist 05/20/2015   Primary localized osteoarthrosis, hand 05/20/2015   Carpal tunnel syndrome on left 03/18/2015   Osteopenia of the elderly 02/19/2014   DDD (degenerative disc disease), lumbar 12/03/2013   Scoliosis 12/03/2013   Chronic low back pain 05/14/2013   IBS (irritable bowel syndrome) 11/08/2012   Hypertension 10/26/2011   Chronic ulcerative colitis (HCC) 03/23/2011   REFERRING PROVIDER: Vicky Grange DO  REFERRING DIAG: Other idiopathic scoliosis, thoracolumbar region.  Rationale for Evaluation and Treatment: Rehabilitation  THERAPY DIAG:  Muscle spasm of back  Abnormal posture  ONSET DATE: Many years.  SUBJECTIVE:                                                                                                                                                                                           SUBJECTIVE STATEMENT: Felt good after lest session.RT side is the worst today  PERTINENT HISTORY:  Scoliosis.  See above.    PAIN:  Are you having pain? Yes: NPRS scale: 3-4/10. Pain location: LB, right > left and left hip.  .   Pain description: Ache. Aggravating factors: As  above. Relieving factors: As above.  PRECAUTIONS: None  RED FLAGS: None   WEIGHT BEARING RESTRICTIONS: No  FALLS:  Has patient fallen in last 6 months? No  LIVING ENVIRONMENT: Lives with: lives alone Lives in: House/apartment Has following equipment at home: None  OCCUPATION: Retired but leads a very active life.   PLOF: Independent  PATIENT GOALS: Do more with less pain.   OBJECTIVE:  Note: Objective measures were completed at Evaluation unless otherwise noted.  PATIENT SURVEYS:  Modified Oswestry 8/50.   POSTURE: rounded shoulders, forward head, increased lumbar lordosis, increased thoracic kyphosis, and flexed trunk , T-L scoliosis, left shoulder depression.  PALPATION: CC today is palpable tenderness over her right lumbar musculature though she states some days "it switches". She is also over her left lateral hip musculature and greater trochanter.   Genu valgum, right > left.    LUMBAR ROM:   Full active lumbar flexion and extension to 0 degrees.    LOWER EXTREMITY MMT:    Normal LE strength.  LUMBAR SPECIAL TESTS:  (-) SLR testing.     GAIT: The patient walks in a flexed trunk posture and leftward lean.                                                                                                                         TODAY'S TREATMENT:  09/26/23:   In prone over pillow:  STW/M x 24 minutes to patient's bilateral lumbar musculature   HMP x 20 minutes at 80-150 Hz on 40% scan in supine with LE's  on leg elevator for comfort.     09/11/23:  In prone over pillow:  STW/M x 24 minutes to patient's bilateral lumbar musculature f/b HMP x 20 minutes at 80-150 Hz on 40% scan in supine with LE's on leg elevator for comfort.    09/07/23:  Patient in left sdly position and received STW/M x 8 minutes to her right lower lumbar musculature f/b  HMP and IFC at 80-150 Hz on 40% scan x 20 minutes.  Normal modality response following removal of modality.                                      08/29/23 EXERCISE LOG  Exercise Repetitions and Resistance Comments  Nustep  L3 x 16.5 minutes    Rocker board  4 minutes   Seated hip ADD isometric  3 minutes w/ 5 second hold   Seated clams  Green t-band x 3 minutes   LAQ 2# x 25 reps each    Seated HS curl  Green t-band x 15 reps each    Seated slouch overcorrect  20 reps     Blank cell = exercise not performed today                                    08/15/23 EXERCISE LOG  Exercise Repetitions and Resistance Comments  Nustep  L3 x 10 minutes   BP assessment and education 123/78    Seated hip ADD isometric  4 minutes w/ 5 second hold            Blank cell = exercise not performed today  Modalities: no adverse reaction to today's modalities  Date:  Unattended Estim: right lumbar paraspinals, pre mod @ 80-150 Hz, 15 mins, Pain and Tone Hot Pack: Lumbar, 15 mins, Pain and Tone  PATIENT EDUCATION:  Education details: benefits of exercise Person educated: patient Education method: verbal  Education comprehension: patient reported understanding   HOME EXERCISE PROGRAM:   ASSESSMENT:  CLINICAL IMPRESSION: Patient reports decreased pain over all since starting PT. She`continues to  responded very well to  treatments with good response to STW/M.  Performed STW/M again today with Pt prone  good results.   OBJECTIVE IMPAIRMENTS: Abnormal gait, decreased activity tolerance, decreased ROM, increased muscle spasms, postural dysfunction, and pain.   ACTIVITY LIMITATIONS: carrying, lifting, bending, sitting, standing, and locomotion level  PARTICIPATION LIMITATIONS: meal prep, cleaning, laundry, community activity, and yard work  PERSONAL FACTORS: Time since onset of injury/illness/exacerbation and 1 comorbidity: scoliosis are also affecting patient's functional outcome.   REHAB POTENTIAL: Good-  CLINICAL DECISION MAKING: Evolving/moderate complexity  EVALUATION COMPLEXITY: Moderate   GOALS:  LONG  TERM GOALS: Target date: 11/07/23.  Ind with a HEP.  Goal status: INITIAL  2.  Perform ADL's with pain not > 4/10.  Goal status: INITIAL  3.  Sit 30 minutes with pain not > 4/10.  Goal status: INITIAL   PLAN:  PT FREQUENCY: 2x/week  PT DURATION: 6 weeks  PLANNED INTERVENTIONS: 97110-Therapeutic exercises, 97530- Therapeutic activity, [id number]- Neuromuscular re-education, 97535- Self Care, 16109- Manual therapy, G0283- Electrical stimulation (unattended), 97035- Ultrasound, 60454- Traction (mechanical), Dry Needling, Cryotherapy, and Moist heat.  PLAN FOR NEXT SESSION: Combo e'stim/US  at 1.50 W/CM2, STW/M, core exercise progression, spinal protection techniques and body mechanics training.  Intermittent traction beginning at 35% body  weight.   Veida Spira,CHRIS, PTA 09/26/2023, 2:44 PM

## 2023-10-03 ENCOUNTER — Ambulatory Visit: Attending: Family Medicine | Admitting: Physical Therapy

## 2023-10-03 DIAGNOSIS — M62830 Muscle spasm of back: Secondary | ICD-10-CM | POA: Insufficient documentation

## 2023-10-03 DIAGNOSIS — R293 Abnormal posture: Secondary | ICD-10-CM | POA: Insufficient documentation

## 2023-10-03 NOTE — Therapy (Signed)
 OUTPATIENT PHYSICAL THERAPY THORACOLUMBAR TREATMENT   Patient Name: Lori Soto MRN: [id number] DOB:[birth year], 88 y.o., female Today's Date: 10/03/2023  END OF SESSION:  PT End of Session - 10/03/23 1651     Visit Number 8    Number of Visits 12    Date for PT Re-Evaluation 11/07/23    PT Start Time 0315    PT Stop Time 0407    PT Time Calculation (min) 52 min    Activity Tolerance Patient tolerated treatment well    Behavior During Therapy WFL for tasks assessed/performed                 Past Medical History:  Diagnosis Date   Anemia    Arthritis    Cancer (HCC)    skin cancer on nose   Carpal tunnel syndrome, bilateral    Cataract    Esophagitis    GERD (gastroesophageal reflux disease)    H/O hiatal hernia    Osteopenia    Scoliosis    Shingles    SVT (supraventricular tachycardia) (HCC)    Ulcerative colitis    Past Surgical History:  Procedure Laterality Date   ABDOMINAL HYSTERECTOMY     BUNIONECTOMY     Bilateral   CARPAL TUNNEL RELEASE Left 03/10/2015   Procedure: LEFT CARPAL TUNNEL RELEASE;  Surgeon: Lyanne Sample, MD;  Location: [facility];  Service: Orthopedics;  Laterality: Left;   CARPAL TUNNEL RELEASE Right 10/31/2017   Procedure: RIGHT CARPAL TUNNEL RELEASE;  Surgeon: Lyanne Sample, MD;  Location: [facility];  Service: Orthopedics;  Laterality: Right;   CATARACT EXTRACTION W/PHACO  05/28/2012   Procedure: CATARACT EXTRACTION PHACO AND INTRAOCULAR LENS PLACEMENT (IOC);  Surgeon: Anner Kill, MD;  Location: [facility];  Service: Ophthalmology;  Laterality: Right;  CDE=12.84   CATARACT EXTRACTION W/PHACO  06/07/2012   Procedure: CATARACT EXTRACTION PHACO AND INTRAOCULAR LENS PLACEMENT (IOC);  Surgeon: Anner Kill, MD;  Location: [facility];  Service: Ophthalmology;  Laterality: Left;  CDE 17.60   COLONOSCOPY     COLONOSCOPY N/A 07/08/2015   Procedure: COLONOSCOPY;  Surgeon: Ruby Corporal, MD;  Location: [facility];  Service:  Endoscopy;  Laterality: N/A;  930   ESOPHAGEAL DILATION N/A 07/08/2015   Procedure: ESOPHAGEAL DILATION;  Surgeon: Ruby Corporal, MD;  Location: [facility];  Service: Endoscopy;  Laterality: N/A;   ESOPHAGOGASTRODUODENOSCOPY N/A 07/08/2015   Procedure: ESOPHAGOGASTRODUODENOSCOPY (EGD);  Surgeon: Ruby Corporal, MD;  Location: [facility];  Service: Endoscopy;  Laterality: N/A;   Hernia     Right inguinal   HERNIA REPAIR  1961   right inguinal hernia   UPPER GASTROINTESTINAL ENDOSCOPY     Patient Active Problem List   Diagnosis Date Noted   Hospital discharge follow-up 05/17/2023   Pulmonary embolism (HCC) 05/06/2023   Murmur 03/07/2023   History of supraventricular tachycardia 11/08/2021   Arthritis 06/13/2019   GERD (gastroesophageal reflux disease) 06/10/2019   SVT (supraventricular tachycardia) (HCC) 08/28/2018   Light headedness 05/09/2018   Carpal tunnel syndrome of right wrist 05/20/2015   Primary localized osteoarthrosis, hand 05/20/2015   Carpal tunnel syndrome on left 03/18/2015   Osteopenia of the elderly 02/19/2014   DDD (degenerative disc disease), lumbar 12/03/2013   Scoliosis 12/03/2013   Chronic low back pain 05/14/2013   IBS (irritable bowel syndrome) 11/08/2012   Hypertension 10/26/2011   Chronic ulcerative colitis (HCC) 03/23/2011   REFERRING PROVIDER: Vicky Grange DO  REFERRING DIAG: Other idiopathic scoliosis,  thoracolumbar region.  Rationale for Evaluation and Treatment: Rehabilitation  THERAPY DIAG:  Muscle spasm of back  Abnormal posture  ONSET DATE: Many years.  SUBJECTIVE:                                                                                                                                                                                           SUBJECTIVE STATEMENT: Pain about a 5.  Been busy today.    PERTINENT HISTORY:  Scoliosis.  See above.    PAIN:  Are you having pain? Yes: NPRS scale: 5/10. Pain location: LB,  right > left and left hip.  .   Pain description: Ache. Aggravating factors: As above. Relieving factors: As above.  PRECAUTIONS: None  RED FLAGS: None   WEIGHT BEARING RESTRICTIONS: No  FALLS:  Has patient fallen in last 6 months? No  LIVING ENVIRONMENT: Lives with: lives alone Lives in: House/apartment Has following equipment at home: None  OCCUPATION: Retired but leads a very active life.   PLOF: Independent  PATIENT GOALS: Do more with less pain.   OBJECTIVE:  Note: Objective measures were completed at Evaluation unless otherwise noted.  PATIENT SURVEYS:  Modified Oswestry 8/50.   POSTURE: rounded shoulders, forward head, increased lumbar lordosis, increased thoracic kyphosis, and flexed trunk , T-L scoliosis, left shoulder depression.  PALPATION: CC today is palpable tenderness over her right lumbar musculature though she states some days "it switches". She is also over her left lateral hip musculature and greater trochanter.   Genu valgum, right > left.    LUMBAR ROM:   Full active lumbar flexion and extension to 0 degrees.    LOWER EXTREMITY MMT:    Normal LE strength.  LUMBAR SPECIAL TESTS:  (-) SLR testing.     GAIT: The patient walks in a flexed trunk posture and leftward lean.                                                                                                                         TODAY'S TREATMENT:  10/03/23:    In prone over pillow:  STW/M x 24  minutes to patient's bilateral lumbar musculature   HMP x 20 minutes at 80-150 Hz on 40% scan in supine with LE's on leg elevator for comfort.    09/26/23:   In prone over pillow:  STW/M x 24 minutes to patient's bilateral lumbar musculature   HMP x 20 minutes at 80-150 Hz on 40% scan in supine with LE's on leg elevator for comfort.     09/11/23:  In prone over pillow:  STW/M x 24 minutes to patient's bilateral lumbar musculature f/b HMP x 20 minutes at 80-150 Hz on 40% scan in supine  with LE's on leg elevator for comfort.    09/07/23:  Patient in left sdly position and received STW/M x 8 minutes to her right lower lumbar musculature f/b  HMP and IFC at 80-150 Hz on 40% scan x 20 minutes.  Normal modality response following removal of modality.                                     08/29/23 EXERCISE LOG  Exercise Repetitions and Resistance Comments  Nustep  L3 x 16.5 minutes    Rocker board  4 minutes   Seated hip ADD isometric  3 minutes w/ 5 second hold   Seated clams  Green t-band x 3 minutes   LAQ 2# x 25 reps each    Seated HS curl  Green t-band x 15 reps each    Seated slouch overcorrect  20 reps     Blank cell = exercise not performed today                                    08/15/23 EXERCISE LOG  Exercise Repetitions and Resistance Comments  Nustep  L3 x 10 minutes   BP assessment and education 123/78    Seated hip ADD isometric  4 minutes w/ 5 second hold            Blank cell = exercise not performed today  Modalities: no adverse reaction to today's modalities  Date:  Unattended Estim: right lumbar paraspinals, pre mod @ 80-150 Hz, 15 mins, Pain and Tone Hot Pack: Lumbar, 15 mins, Pain and Tone  PATIENT EDUCATION:  Education details: benefits of exercise Person educated: patient Education method: verbal  Education comprehension: patient reported understanding   HOME EXERCISE PROGRAM:   ASSESSMENT:  CLINICAL IMPRESSION:  Patient states that soft tissue work has been very helpful.  Her pain up today due to being very active today.     OBJECTIVE IMPAIRMENTS: Abnormal gait, decreased activity tolerance, decreased ROM, increased muscle spasms, postural dysfunction, and pain.   ACTIVITY LIMITATIONS: carrying, lifting, bending, sitting, standing, and locomotion level  PARTICIPATION LIMITATIONS: meal prep, cleaning, laundry, community activity, and yard work  PERSONAL FACTORS: Time since onset of injury/illness/exacerbation and 1 comorbidity:  scoliosis are also affecting patient's functional outcome.   REHAB POTENTIAL: Good-  CLINICAL DECISION MAKING: Evolving/moderate complexity  EVALUATION COMPLEXITY: Moderate   GOALS:  LONG TERM GOALS: Target date: 11/07/23.  Ind with a HEP.  Goal status: INITIAL  2.  Perform ADL's with pain not > 4/10.  Goal status: INITIAL  3.  Sit 30 minutes with pain not > 4/10.  Goal status: INITIAL   PLAN:  PT FREQUENCY: 2x/week  PT DURATION: 6 weeks  PLANNED INTERVENTIONS: 97110-Therapeutic exercises, 97530- Therapeutic  activity, [id number]- Neuromuscular re-education, [id number]- Self Care, 01027- Manual therapy, G0283- Electrical stimulation (unattended), [id number]- Ultrasound, [id number]- Traction (mechanical), Dry Needling, Cryotherapy, and Moist heat.  PLAN FOR NEXT SESSION: Combo e'stim/US  at 1.50 W/CM2, STW/M, core exercise progression, spinal protection techniques and body mechanics training.  Intermittent traction beginning at 35% body weight.   Belinda Bringhurst, [place], PT 10/03/2023, 6:22 PM

## 2023-10-10 ENCOUNTER — Ambulatory Visit: Admitting: Physical Therapy

## 2023-10-10 DIAGNOSIS — R293 Abnormal posture: Secondary | ICD-10-CM | POA: Diagnosis not present

## 2023-10-10 DIAGNOSIS — M62830 Muscle spasm of back: Secondary | ICD-10-CM

## 2023-10-11 NOTE — Therapy (Signed)
 OUTPATIENT PHYSICAL THERAPY THORACOLUMBAR TREATMENT   Patient Name: Lori Soto MRN: [id number] DOB:01-Oct-1935, 88 y.o., female Today's Date: 10/11/2023  END OF SESSION:  PT End of Session - 10/11/23 0819     Visit Number 9    Number of Visits 12    Date for PT Re-Evaluation 11/07/23    PT Start Time 0315    PT Stop Time 0407    PT Time Calculation (min) 52 min    Activity Tolerance Patient tolerated treatment well    Behavior During Therapy WFL for tasks assessed/performed                 Past Medical History:  Diagnosis Date   Anemia    Arthritis    Cancer (HCC)    skin cancer on nose   Carpal tunnel syndrome, bilateral    Cataract    Esophagitis    GERD (gastroesophageal reflux disease)    H/O hiatal hernia    Osteopenia    Scoliosis    Shingles    SVT (supraventricular tachycardia) (HCC)    Ulcerative colitis    Past Surgical History:  Procedure Laterality Date   ABDOMINAL HYSTERECTOMY     BUNIONECTOMY     Bilateral   CARPAL TUNNEL RELEASE Left 03/10/2015   Procedure: LEFT CARPAL TUNNEL RELEASE;  Surgeon: Lyanne Sample, MD;  Location: [facility];  Service: Orthopedics;  Laterality: Left;   CARPAL TUNNEL RELEASE Right 10/31/2017   Procedure: RIGHT CARPAL TUNNEL RELEASE;  Surgeon: Lyanne Sample, MD;  Location: [facility];  Service: Orthopedics;  Laterality: Right;   CATARACT EXTRACTION W/PHACO  05/28/2012   Procedure: CATARACT EXTRACTION PHACO AND INTRAOCULAR LENS PLACEMENT (IOC);  Surgeon: Anner Kill, MD;  Location: [facility];  Service: Ophthalmology;  Laterality: Right;  CDE=12.84   CATARACT EXTRACTION W/PHACO  06/07/2012   Procedure: CATARACT EXTRACTION PHACO AND INTRAOCULAR LENS PLACEMENT (IOC);  Surgeon: Anner Kill, MD;  Location: [facility];  Service: Ophthalmology;  Laterality: Left;  CDE 17.60   COLONOSCOPY     COLONOSCOPY N/A 07/08/2015   Procedure: COLONOSCOPY;  Surgeon: Ruby Corporal, MD;  [facility];  Service:  Endoscopy;  Laterality: N/A;  930   ESOPHAGEAL DILATION N/A 07/08/2015   Procedure: ESOPHAGEAL DILATION;  Surgeon: Ruby Corporal, MD;  [facility];  Service: Endoscopy;  Laterality: N/A;   ESOPHAGOGASTRODUODENOSCOPY N/A 07/08/2015   Procedure: ESOPHAGOGASTRODUODENOSCOPY (EGD);  Surgeon: Ruby Corporal, MD;  [facility];  Service: Endoscopy;  Laterality: N/A;   Hernia     Right inguinal   HERNIA REPAIR  1961   right inguinal hernia   UPPER GASTROINTESTINAL ENDOSCOPY     Patient Active Problem List   Diagnosis Date Noted   Hospital discharge follow-up 05/17/2023   Pulmonary embolism (HCC) 05/06/2023   Murmur 03/07/2023   History of supraventricular tachycardia 11/08/2021   Arthritis 06/13/2019   GERD (gastroesophageal reflux disease) 06/10/2019   SVT (supraventricular tachycardia) (HCC) 08/28/2018   Light headedness 05/09/2018   Carpal tunnel syndrome of right wrist 05/20/2015   Primary localized osteoarthrosis, hand 05/20/2015   Carpal tunnel syndrome on left 03/18/2015   Osteopenia of the elderly 02/19/2014   DDD (degenerative disc disease), lumbar 12/03/2013   Scoliosis 12/03/2013   Chronic low back pain 05/14/2013   IBS (irritable bowel syndrome) 11/08/2012   Hypertension 10/26/2011   Chronic ulcerative colitis (HCC) 03/23/2011   REFERRING PROVIDER: Vicky Grange DO  REFERRING DIAG: Other idiopathic scoliosis,  thoracolumbar region.  Rationale for Evaluation and Treatment: Rehabilitation  THERAPY DIAG:  No diagnosis found.  ONSET DATE: Many years.  SUBJECTIVE:                                                                                                                                                                                           SUBJECTIVE STATEMENT: Pain 4/10.    PERTINENT HISTORY:  Scoliosis.  See above.    PAIN:  Are you having pain? Yes: NPRS scale: 4/10. Pain location: LB, right > left and left hip.  .   Pain  description: Ache. Aggravating factors: As above. Relieving factors: As above.  PRECAUTIONS: None  RED FLAGS: None   WEIGHT BEARING RESTRICTIONS: No  FALLS:  Has patient fallen in last 6 months? No  LIVING ENVIRONMENT: Lives with: lives alone Lives in: House/apartment Has following equipment at home: None  OCCUPATION: Retired but leads a very active life.   PLOF: Independent  PATIENT GOALS: Do more with less pain.   OBJECTIVE:  Note: Objective measures were completed at Evaluation unless otherwise noted.  PATIENT SURVEYS:  Modified Oswestry 8/50.   POSTURE: rounded shoulders, forward head, increased lumbar lordosis, increased thoracic kyphosis, and flexed trunk , T-L scoliosis, left shoulder depression.  PALPATION: CC today is palpable tenderness over her right lumbar musculature though she states some days it switches. She is also over her left lateral hip musculature and greater trochanter.   Genu valgum, right > left.    LUMBAR ROM:   Full active lumbar flexion and extension to 0 degrees.    LOWER EXTREMITY MMT:    Normal LE strength.  LUMBAR SPECIAL TESTS:  (-) SLR testing.     GAIT: The patient walks in a flexed trunk posture and leftward lean.                                                                                                                         TODAY'S TREATMENT:  10/10/23:  In prone over pillow:  STW/M x 24 minutes to patient's bilateral lumbar musculature   HMP x 20 minutes  at 80-150 Hz on 40% scan in supine with LE's on leg elevator for comfort.    10/03/23:    In prone over pillow:  STW/M x 24 minutes to patient's bilateral lumbar musculature   HMP x 20 minutes at 80-150 Hz on 40% scan in supine with LE's on leg elevator for comfort.           ASSESSMENT:  CLINICAL IMPRESSION:  Patient had a very active day but pain is down some.  She finds STW/M to be the most effective intervention.   OBJECTIVE IMPAIRMENTS:  Abnormal gait, decreased activity tolerance, decreased ROM, increased muscle spasms, postural dysfunction, and pain.   ACTIVITY LIMITATIONS: carrying, lifting, bending, sitting, standing, and locomotion level  PARTICIPATION LIMITATIONS: meal prep, cleaning, laundry, community activity, and yard work  PERSONAL FACTORS: Time since onset of injury/illness/exacerbation and 1 comorbidity: scoliosis are also affecting patient's functional outcome.   REHAB POTENTIAL: Good-  CLINICAL DECISION MAKING: Evolving/moderate complexity  EVALUATION COMPLEXITY: Moderate   GOALS:  LONG TERM GOALS: Target date: 11/07/23.  Ind with a HEP.  Goal status: INITIAL  2.  Perform ADL's with pain not > 4/10.  Goal status: INITIAL  3.  Sit 30 minutes with pain not > 4/10.  Goal status: INITIAL   PLAN:  PT FREQUENCY: 2x/week  PT DURATION: 6 weeks  PLANNED INTERVENTIONS: 97110-Therapeutic exercises, 97530- Therapeutic activity, [id number]- Neuromuscular re-education, 97535- Self Care, 16109- Manual therapy, G0283- Electrical stimulation (unattended), 97035- Ultrasound, 60454- Traction (mechanical), Dry Needling, Cryotherapy, and Moist heat.  PLAN FOR NEXT SESSION: Combo e'stim/US  at 1.50 W/CM2, STW/M, core exercise progression, spinal protection techniques and body mechanics training.  Intermittent traction beginning at 35% body weight.   Aaliyah Cancro, [place], PT 10/11/2023, 8:20 AM

## 2023-10-24 ENCOUNTER — Encounter: Payer: Self-pay | Admitting: Physical Therapy

## 2023-10-24 ENCOUNTER — Ambulatory Visit: Admitting: Physical Therapy

## 2023-10-24 DIAGNOSIS — R293 Abnormal posture: Secondary | ICD-10-CM | POA: Diagnosis not present

## 2023-10-24 DIAGNOSIS — M62830 Muscle spasm of back: Secondary | ICD-10-CM

## 2023-10-24 NOTE — Therapy (Signed)
 OUTPATIENT PHYSICAL THERAPY THORACOLUMBAR TREATMENT   Patient Name: Lori Soto MRN: [id number] DOB:1935-06-20, 88 y.o., female Today's Date: 10/24/2023  END OF SESSION:  PT End of Session - 10/24/23 1420     Visit Number 10    Number of Visits 12    Date for PT Re-Evaluation 11/07/23    PT Start Time 0130    PT Stop Time 0220    PT Time Calculation (min) 50 min    Activity Tolerance Patient tolerated treatment well    Behavior During Therapy WFL for tasks assessed/performed              Past Medical History:  Diagnosis Date   Anemia    Arthritis    Cancer (HCC)    skin cancer on nose   Carpal tunnel syndrome, bilateral    Cataract    Esophagitis    GERD (gastroesophageal reflux disease)    H/O hiatal hernia    Osteopenia    Scoliosis    Shingles    SVT (supraventricular tachycardia) (HCC)    Ulcerative colitis    Past Surgical History:  Procedure Laterality Date   ABDOMINAL HYSTERECTOMY     BUNIONECTOMY     Bilateral   CARPAL TUNNEL RELEASE Left 03/10/2015   Procedure: LEFT CARPAL TUNNEL RELEASE;  Surgeon: Arley Curia, MD;  Location: [facility];  Service: Orthopedics;  Laterality: Left;   CARPAL TUNNEL RELEASE Right 10/31/2017   Procedure: RIGHT CARPAL TUNNEL RELEASE;  Surgeon: Curia Arley, MD;  Location: [facility];  Service: Orthopedics;  Laterality: Right;   CATARACT EXTRACTION W/PHACO  05/28/2012   Procedure: CATARACT EXTRACTION PHACO AND INTRAOCULAR LENS PLACEMENT (IOC);  Surgeon: Cherene Mania, MD;  Location: [facility];  Service: Ophthalmology;  Laterality: Right;  CDE=12.84   CATARACT EXTRACTION W/PHACO  06/07/2012   Procedure: CATARACT EXTRACTION PHACO AND INTRAOCULAR LENS PLACEMENT (IOC);  Surgeon: Cherene Mania, MD;  Location: [facility];  Service: Ophthalmology;  Laterality: Left;  CDE 17.60   COLONOSCOPY     COLONOSCOPY N/A 07/08/2015   Procedure: COLONOSCOPY;  Surgeon: Claudis RAYMOND Rivet, MD;  [facility];  Service:  Endoscopy;  Laterality: N/A;  930   ESOPHAGEAL DILATION N/A 07/08/2015   Procedure: ESOPHAGEAL DILATION;  Surgeon: Claudis RAYMOND Rivet, MD;  [facility];  Service: Endoscopy;  Laterality: N/A;   ESOPHAGOGASTRODUODENOSCOPY N/A 07/08/2015   Procedure: ESOPHAGOGASTRODUODENOSCOPY (EGD);  Surgeon: Claudis RAYMOND Rivet, MD;  [facility]: [facility];  Service: Endoscopy;  Laterality: N/A;   Hernia     Right inguinal   HERNIA REPAIR  1961   right inguinal hernia   UPPER GASTROINTESTINAL ENDOSCOPY     Patient Active Problem List   Diagnosis Date Noted   Hospital discharge follow-up 05/17/2023   Pulmonary embolism (HCC) 05/06/2023   Murmur 03/07/2023   History of supraventricular tachycardia 11/08/2021   Arthritis 06/13/2019   GERD (gastroesophageal reflux disease) 06/10/2019   SVT (supraventricular tachycardia) (HCC) 08/28/2018   Light headedness 05/09/2018   Carpal tunnel syndrome of right wrist 05/20/2015   Primary localized osteoarthrosis, hand 05/20/2015   Carpal tunnel syndrome on left 03/18/2015   Osteopenia of the elderly 02/19/2014   DDD (degenerative disc disease), lumbar 12/03/2013   Scoliosis 12/03/2013   Chronic low back pain 05/14/2013   IBS (irritable bowel syndrome) 11/08/2012   Hypertension 10/26/2011   Chronic ulcerative colitis (HCC) 03/23/2011   REFERRING PROVIDER: Norene Fielding DO  REFERRING DIAG: Other idiopathic scoliosis, thoracolumbar region.  Rationale for Evaluation and Treatment: Rehabilitation  THERAPY DIAG:  Muscle spasm of back  Abnormal posture  ONSET DATE: Many years.  SUBJECTIVE:                                                                                                                                                                                           SUBJECTIVE STATEMENT: Pain about a 5/10. PERTINENT HISTORY:  Scoliosis.  See above.    PAIN:  Are you having pain? Yes: NPRS scale: 5/10. Pain location: LB, right > left and left  hip.  .   Pain description: Ache. Aggravating factors: As above. Relieving factors: As above.  PRECAUTIONS: None  RED FLAGS: None   WEIGHT BEARING RESTRICTIONS: No  FALLS:  Has patient fallen in last 6 months? No  LIVING ENVIRONMENT: Lives with: lives alone Lives in: House/apartment Has following equipment at home: None  OCCUPATION: Retired but leads a very active life.   PLOF: Independent  PATIENT GOALS: Do more with less pain.   OBJECTIVE:  Note: Objective measures were completed at Evaluation unless otherwise noted.  PATIENT SURVEYS:  Modified Oswestry 8/50.   POSTURE: rounded shoulders, forward head, increased lumbar lordosis, increased thoracic kyphosis, and flexed trunk , T-L scoliosis, left shoulder depression.  PALPATION: CC today is palpable tenderness over her right lumbar musculature though she states some days it switches. She is also over her left lateral hip musculature and greater trochanter.   Genu valgum, right > left.    LUMBAR ROM:   Full active lumbar flexion and extension to 0 degrees.    LOWER EXTREMITY MMT:    Normal LE strength.  LUMBAR SPECIAL TESTS:  (-) SLR testing.     GAIT: The patient walks in a flexed trunk posture and leftward lean.                                                                                                                         TODAY'S TREATMENT:   10/24/23: In prone over pillow:  STW/M x 26 minutes to patient's bilateral lumbar musculature   HMP x 20 minutes  at 80-150 Hz on 40% scan in supine with LE's on leg elevator for comfort.     10/10/23:  In prone over pillow:  STW/M x 24 minutes to patient's bilateral lumbar musculature   HMP x 20 minutes at 80-150 Hz on 40% scan in supine with LE's on leg elevator for comfort.    10/03/23:    In prone over pillow:  STW/M x 24 minutes to patient's bilateral lumbar musculature   HMP x 20 minutes at 80-150 Hz on 40% scan in supine with LE's on leg elevator  for comfort.           ASSESSMENT:  CLINICAL IMPRESSION:  Patient with a slight rise in pain but attributes to being out of town and playing with grandchild.    OBJECTIVE IMPAIRMENTS: Abnormal gait, decreased activity tolerance, decreased ROM, increased muscle spasms, postural dysfunction, and pain.   ACTIVITY LIMITATIONS: carrying, lifting, bending, sitting, standing, and locomotion level  PARTICIPATION LIMITATIONS: meal prep, cleaning, laundry, community activity, and yard work  PERSONAL FACTORS: Time since onset of injury/illness/exacerbation and 1 comorbidity: scoliosis are also affecting patient's functional outcome.   REHAB POTENTIAL: Good-  CLINICAL DECISION MAKING: Evolving/moderate complexity  EVALUATION COMPLEXITY: Moderate   GOALS:  LONG TERM GOALS: Target date: 11/07/23.  Ind with a HEP.  Goal status: Partially met.  2.  Perform ADL's with pain not > 4/10.  Goal status: Partially met.  3.  Sit 30 minutes with pain not > 4/10.  Goal status: Ongoing.   PLAN:  PT FREQUENCY: 2x/week  PT DURATION: 6 weeks  PLANNED INTERVENTIONS: 97110-Therapeutic exercises, 97530- Therapeutic activity, [id number]- Neuromuscular re-education, 97535- Self Care, 02859- Manual therapy, G0283- Electrical stimulation (unattended), 97035- Ultrasound, 02987- Traction (mechanical), Dry Needling, Cryotherapy, and Moist heat.  PLAN FOR NEXT SESSION: Combo e'stim/US  at 1.50 W/CM2, STW/M, core exercise progression, spinal protection techniques and body mechanics training.  Intermittent traction beginning at 35% body weight.  Progress Note Reporting Period 08/09/23 to 10/24/23:  See note below for Objective Data and Assessment of Progress/Goals. Patient pleased with progress and progressing toward goals.  Two visits remaining.      Kedar Sedano, [place], PT 10/24/2023, 2:37 PM

## 2023-10-31 ENCOUNTER — Ambulatory Visit: Attending: Family Medicine | Admitting: Physical Therapy

## 2023-10-31 DIAGNOSIS — R293 Abnormal posture: Secondary | ICD-10-CM | POA: Insufficient documentation

## 2023-10-31 DIAGNOSIS — M62830 Muscle spasm of back: Secondary | ICD-10-CM | POA: Diagnosis not present

## 2023-10-31 NOTE — Therapy (Signed)
 OUTPATIENT PHYSICAL THERAPY THORACOLUMBAR TREATMENT   Patient Name: Lori Soto MRN: [id number] DOB:Sep 04, 1935, 88 y.o., female Today's Date: 10/31/2023  END OF SESSION:  PT End of Session - 10/31/23 1506     Visit Number 11    Number of Visits 12    Date for PT Re-Evaluation 11/07/23    PT Start Time 0232    PT Stop Time 0319    PT Time Calculation (min) 47 min    Activity Tolerance Patient tolerated treatment well    Behavior During Therapy WFL for tasks assessed/performed              Past Medical History:  Diagnosis Date   Anemia    Arthritis    Cancer (HCC)    skin cancer on nose   Carpal tunnel syndrome, bilateral    Cataract    Esophagitis    GERD (gastroesophageal reflux disease)    H/O hiatal hernia    Osteopenia    Scoliosis    Shingles    SVT (supraventricular tachycardia) (HCC)    Ulcerative colitis    Past Surgical History:  Procedure Laterality Date   ABDOMINAL HYSTERECTOMY     BUNIONECTOMY     Bilateral   CARPAL TUNNEL RELEASE Left 03/10/2015   Procedure: LEFT CARPAL TUNNEL RELEASE;  Surgeon: Arley Curia, MD;  Location: [facility];  Service: Orthopedics;  Laterality: Left;   CARPAL TUNNEL RELEASE Right 10/31/2017   Procedure: RIGHT CARPAL TUNNEL RELEASE;  Surgeon: Curia Arley, MD;  Location: [facility];  Service: Orthopedics;  Laterality: Right;   CATARACT EXTRACTION W/PHACO  05/28/2012   Procedure: CATARACT EXTRACTION PHACO AND INTRAOCULAR LENS PLACEMENT (IOC);  Surgeon: Cherene Mania, MD;  Location: [facility];  Service: Ophthalmology;  Laterality: Right;  CDE=12.84   CATARACT EXTRACTION W/PHACO  06/07/2012   Procedure: CATARACT EXTRACTION PHACO AND INTRAOCULAR LENS PLACEMENT (IOC);  Surgeon: Cherene Mania, MD;  Location: [facility];  Service: Ophthalmology;  Laterality: Left;  CDE 17.60   COLONOSCOPY     COLONOSCOPY N/A 07/08/2015   Procedure: COLONOSCOPY;  Surgeon: Claudis RAYMOND Rivet, MD;  [facility];  Service:  Endoscopy;  Laterality: N/A;  930   ESOPHAGEAL DILATION N/A 07/08/2015   Procedure: ESOPHAGEAL DILATION;  Surgeon: Claudis RAYMOND Rivet, MD;  [facility];  Service: Endoscopy;  Laterality: N/A;   ESOPHAGOGASTRODUODENOSCOPY N/A 07/08/2015   Procedure: ESOPHAGOGASTRODUODENOSCOPY (EGD);  Surgeon: Claudis RAYMOND Rivet, MD;  [facility];  Service: Endoscopy;  Laterality: N/A;   Hernia     Right inguinal   HERNIA REPAIR  1961   right inguinal hernia   UPPER GASTROINTESTINAL ENDOSCOPY     Patient Active Problem List   Diagnosis Date Noted   Hospital discharge follow-up 05/17/2023   Pulmonary embolism (HCC) 05/06/2023   Murmur 03/07/2023   History of supraventricular tachycardia 11/08/2021   Arthritis 06/13/2019   GERD (gastroesophageal reflux disease) 06/10/2019   SVT (supraventricular tachycardia) (HCC) 08/28/2018   Light headedness 05/09/2018   Carpal tunnel syndrome of right wrist 05/20/2015   Primary localized osteoarthrosis, hand 05/20/2015   Carpal tunnel syndrome on left 03/18/2015   Osteopenia of the elderly 02/19/2014   DDD (degenerative disc disease), lumbar 12/03/2013   Scoliosis 12/03/2013   Chronic low back pain 05/14/2013   IBS (irritable bowel syndrome) 11/08/2012   Hypertension 10/26/2011   Chronic ulcerative colitis (HCC) 03/23/2011   REFERRING PROVIDER: Norene Fielding DO  REFERRING DIAG: Other idiopathic scoliosis, thoracolumbar region.  Rationale for Evaluation and Treatment: Rehabilitation  THERAPY DIAG:  Muscle spasm of back  Abnormal posture  ONSET DATE: Many years.  SUBJECTIVE:                                                                                                                                                                                           SUBJECTIVE STATEMENT: Pain about a 5/10.  Did Meals on Wheels today.   PERTINENT HISTORY:  Scoliosis.  See above.    PAIN:  Are you having pain? Yes: NPRS scale: 5/10. Pain  location: LB, right > left and left hip.  .   Pain description: Ache. Aggravating factors: As above. Relieving factors: As above.  PRECAUTIONS: None  RED FLAGS: None   WEIGHT BEARING RESTRICTIONS: No  FALLS:  Has patient fallen in last 6 months? No  LIVING ENVIRONMENT: Lives with: lives alone Lives in: House/apartment Has following equipment at home: None  OCCUPATION: Retired but leads a very active life.   PLOF: Independent  PATIENT GOALS: Do more with less pain.   OBJECTIVE:  Note: Objective measures were completed at Evaluation unless otherwise noted.  PATIENT SURVEYS:  Modified Oswestry 8/50.   POSTURE: rounded shoulders, forward head, increased lumbar lordosis, increased thoracic kyphosis, and flexed trunk , T-L scoliosis, left shoulder depression.  PALPATION: CC today is palpable tenderness over her right lumbar musculature though she states some days it switches. She is also over her left lateral hip musculature and greater trochanter.   Genu valgum, right > left.    LUMBAR ROM:   Full active lumbar flexion and extension to 0 degrees.    LOWER EXTREMITY MMT:    Normal LE strength.  LUMBAR SPECIAL TESTS:  (-) SLR testing.     GAIT: The patient walks in a flexed trunk posture and leftward lean.                                                                                                                         TODAY'S TREATMENT:  10/31/23:  In prone :  STW/M x 24 minutes to patient's right lower  lumbar musculature including ischemic release technique  HMP x 20 minutes at 80-150 Hz on 40% scan in supine with LE's on leg elevator for comfort.     10/24/23: In prone over pillow:  STW/M x 26 minutes to patient's bilateral lumbar musculature   HMP x 20 minutes at 80-150 Hz on 40% scan in supine with LE's on leg elevator for comfort.     10/10/23:  In prone over pillow:  STW/M x 24 minutes to patient's bilateral lumbar musculature   HMP x 20 minutes at  80-150 Hz on 40% scan in supine with LE's on leg elevator for comfort.             ASSESSMENT:  CLINICAL IMPRESSION:  Patient with a slight rise in pain but attributes to doing Meals on Wheels today.  Pain localized to right lower lumbar region.  Instructed patient in use of a Thercane which she may purchase.    She was nearly pain-free after treatment.  OBJECTIVE IMPAIRMENTS: Abnormal gait, decreased activity tolerance, decreased ROM, increased muscle spasms, postural dysfunction, and pain.   ACTIVITY LIMITATIONS: carrying, lifting, bending, sitting, standing, and locomotion level  PARTICIPATION LIMITATIONS: meal prep, cleaning, laundry, community activity, and yard work  PERSONAL FACTORS: Time since onset of injury/illness/exacerbation and 1 comorbidity: scoliosis are also affecting patient's functional outcome.   REHAB POTENTIAL: Good-  CLINICAL DECISION MAKING: Evolving/moderate complexity  EVALUATION COMPLEXITY: Moderate   GOALS:  LONG TERM GOALS: Target date: 11/07/23.  Ind with a HEP.  Goal status: Partially met.  2.  Perform ADL's with pain not > 4/10.  Goal status: Partially met.  3.  Sit 30 minutes with pain not > 4/10.  Goal status: Ongoing.   PLAN:  PT FREQUENCY: 2x/week  PT DURATION: 6 weeks  PLANNED INTERVENTIONS: 97110-Therapeutic exercises, 97530- Therapeutic activity, [id number]- Neuromuscular re-education, 97535- Self Care, 02859- Manual therapy, G0283- Electrical stimulation (unattended), 97035- Ultrasound, 02987- Traction (mechanical), Dry Needling, Cryotherapy, and Moist heat.  PLAN FOR NEXT SESSION: Combo e'stim/US  at 1.50 W/CM2, STW/M, core exercise progression, spinal protection techniques and body mechanics training.  Intermittent traction beginning at 35% body weight.   Takeem Krotzer, [place], PT 10/31/2023, 3:54 PM

## 2023-11-07 ENCOUNTER — Ambulatory Visit: Admitting: Physical Therapy

## 2023-11-07 DIAGNOSIS — R293 Abnormal posture: Secondary | ICD-10-CM | POA: Diagnosis not present

## 2023-11-07 DIAGNOSIS — M62830 Muscle spasm of back: Secondary | ICD-10-CM

## 2023-11-07 NOTE — Therapy (Signed)
 OUTPATIENT PHYSICAL THERAPY THORACOLUMBAR TREATMENT   Patient Name: Lori Soto MRN: [id number] DOB:[birth year], 88 y.o., female Today's Date: 11/07/2023  END OF SESSION:  PT End of Session - 11/07/23 1511     Visit Number 12    Number of Visits 12    Date for PT Re-Evaluation 11/07/23    PT Start Time 0234    Activity Tolerance Patient tolerated treatment well    Behavior During Therapy [facility] for tasks assessed/performed               Past Medical History:  Diagnosis Date   Anemia    Arthritis    Cancer (HCC)    skin cancer on nose   Carpal tunnel syndrome, bilateral    Cataract    Esophagitis    GERD (gastroesophageal reflux disease)    H/O hiatal hernia    Osteopenia    Scoliosis    Shingles    SVT (supraventricular tachycardia) (HCC)    Ulcerative colitis    Past Surgical History:  Procedure Laterality Date   ABDOMINAL HYSTERECTOMY     BUNIONECTOMY     Bilateral   CARPAL TUNNEL RELEASE Left 03/10/2015   Procedure: LEFT CARPAL TUNNEL RELEASE;  Surgeon: Arley Curia, MD;  Location: [facility];  Service: Orthopedics;  Laterality: Left;   CARPAL TUNNEL RELEASE Right 10/31/2017   Procedure: RIGHT CARPAL TUNNEL RELEASE;  Surgeon: Curia Arley, MD;  Location: [facility];  Service: Orthopedics;  Laterality: Right;   CATARACT EXTRACTION W/PHACO  05/28/2012   Procedure: CATARACT EXTRACTION PHACO AND INTRAOCULAR LENS PLACEMENT (IOC);  Surgeon: Cherene Mania, MD;  Location: [facility];  Service: Ophthalmology;  Laterality: Right;  CDE=12.84   CATARACT EXTRACTION W/PHACO  06/07/2012   Procedure: CATARACT EXTRACTION PHACO AND INTRAOCULAR LENS PLACEMENT (IOC);  Surgeon: Cherene Mania, MD;  Location: [facility];  Service: Ophthalmology;  Laterality: Left;  CDE 17.60   COLONOSCOPY     COLONOSCOPY N/A 07/08/2015   Procedure: COLONOSCOPY;  Surgeon: Claudis RAYMOND Rivet, MD;  Location: [facility];  Service: Endoscopy;  Laterality: N/A;  930   ESOPHAGEAL DILATION N/A  07/08/2015   Procedure: ESOPHAGEAL DILATION;  Surgeon: Claudis RAYMOND Rivet, MD;  Location: [facility];  Service: Endoscopy;  Laterality: N/A;   ESOPHAGOGASTRODUODENOSCOPY N/A 07/08/2015   Procedure: ESOPHAGOGASTRODUODENOSCOPY (EGD);  Surgeon: Claudis RAYMOND Rivet, MD;  Location: [facility];  Service: Endoscopy;  Laterality: N/A;   Hernia     Right inguinal   HERNIA REPAIR  1961   right inguinal hernia   UPPER GASTROINTESTINAL ENDOSCOPY     Patient Active Problem List   Diagnosis Date Noted   Hospital discharge follow-up 05/17/2023   Pulmonary embolism (HCC) 05/06/2023   Murmur 03/07/2023   History of supraventricular tachycardia 11/08/2021   Arthritis 06/13/2019   GERD (gastroesophageal reflux disease) 06/10/2019   SVT (supraventricular tachycardia) (HCC) 08/28/2018   Light headedness 05/09/2018   Carpal tunnel syndrome of right wrist 05/20/2015   Primary localized osteoarthrosis, hand 05/20/2015   Carpal tunnel syndrome on left 03/18/2015   Osteopenia of the elderly 02/19/2014   DDD (degenerative disc disease), lumbar 12/03/2013   Scoliosis 12/03/2013   Chronic low back pain 05/14/2013   IBS (irritable bowel syndrome) 11/08/2012   Hypertension 10/26/2011   Chronic ulcerative colitis (HCC) 03/23/2011   REFERRING PROVIDER: Norene Fielding DO  REFERRING DIAG: Other idiopathic scoliosis, thoracolumbar region.  Rationale for Evaluation and Treatment: Rehabilitation  THERAPY DIAG:  Muscle spasm of back  Abnormal posture  ONSET DATE: Many years.  SUBJECTIVE:                                                                                                                                                                                           SUBJECTIVE STATEMENT: Pain about a 5/10.  Did Meals on Wheels today.   PERTINENT HISTORY:  Scoliosis.  See above.    PAIN:  Are you having pain? Yes: NPRS scale: 5/10. Pain location: LB, right > left and left hip.  .   Pain description:  Ache. Aggravating factors: As above. Relieving factors: As above.  PRECAUTIONS: None  RED FLAGS: None   WEIGHT BEARING RESTRICTIONS: No  FALLS:  Has patient fallen in last 6 months? No  LIVING ENVIRONMENT: Lives with: lives alone Lives in: House/apartment Has following equipment at home: None  OCCUPATION: Retired but leads a very active life.   PLOF: Independent  PATIENT GOALS: Do more with less pain.   OBJECTIVE:  Note: Objective measures were completed at Evaluation unless otherwise noted.  PATIENT SURVEYS:  Modified Oswestry 8/50.   POSTURE: rounded shoulders, forward head, increased lumbar lordosis, increased thoracic kyphosis, and flexed trunk , T-L scoliosis, left shoulder depression.  PALPATION: CC today is palpable tenderness over her right lumbar musculature though she states some days it switches. She is also over her left lateral hip musculature and greater trochanter.   Genu valgum, right > left.    LUMBAR ROM:   Full active lumbar flexion and extension to 0 degrees.    LOWER EXTREMITY MMT:    Normal LE strength.  LUMBAR SPECIAL TESTS:  (-) SLR testing.     GAIT: The patient walks in a flexed trunk posture and leftward lean.                                                                                                                         TODAY'S TREATMENT:  11/07/23:   In prone :  STW/M x 24 minutes to patient's right lower lumbar musculature including ischemic release technique  HMP x 20 minutes at 80-150 Hz  on 40% scan in supine with LE's on leg elevator for comfort.    10/31/23:  In prone :  STW/M x 24 minutes to patient's right lower lumbar musculature including ischemic release technique  HMP x 20 minutes at 80-150 Hz on 40% scan in supine with LE's on leg elevator for comfort.     HEP:  SKTC  [[id number]]  SINGLE KNEE TO CHEST STRETCH - SKTC -  Repeat 3 Repetitions, Hold 1 Minute, Complete 1 Set, Perform 3 Times a Day  DOUBLE KNEE  TO CHEST STRETCH - DKTC -  Repeat 2 Repetitions, Hold 1 Minute, Complete 1 Set, Perform 3 Times a Day  Bridges -  Repeat 15 Repetitions, Hold 2 Seconds, Complete 2 Sets, Perform 2 Times a Day        ASSESSMENT:  CLINICAL IMPRESSION:  See below.   OBJECTIVE IMPAIRMENTS: Abnormal gait, decreased activity tolerance, decreased ROM, increased muscle spasms, postural dysfunction, and pain.   ACTIVITY LIMITATIONS: carrying, lifting, bending, sitting, standing, and locomotion level  PARTICIPATION LIMITATIONS: meal prep, cleaning, laundry, community activity, and yard work  PERSONAL FACTORS: Time since onset of injury/illness/exacerbation and 1 comorbidity: scoliosis are also affecting patient's functional outcome.   REHAB POTENTIAL: Good-  CLINICAL DECISION MAKING: Evolving/moderate complexity  EVALUATION COMPLEXITY: Moderate   GOALS:  LONG TERM GOALS: Target date: 11/07/23.  Ind with a HEP.  Goal status: MET.  2.  Perform ADL's with pain not > 4/10.  Goal status: Partially met.  Some days.  3.  Sit 30 minutes with pain not > 4/10.  Goal status: Partially met.  Some days.   PLAN:  PT FREQUENCY: 2x/week  PT DURATION: 6 weeks  PLANNED INTERVENTIONS: 97110-Therapeutic exercises, 97530- Therapeutic activity, [id number]- Neuromuscular re-education, 97535- Self Care, 02859- Manual therapy, G0283- Electrical stimulation (unattended), 97035- Ultrasound, 02987- Traction (mechanical), Dry Needling, Cryotherapy, and Moist heat.  PLAN FOR NEXT SESSION: Combo e'stim/US  at 1.50 W/CM2, STW/M, core exercise progression, spinal protection techniques and body mechanics training.  Intermittent traction beginning at 35% body weight.  PHYSICAL THERAPY DISCHARGE SUMMARY  Visits from Start of Care: 12  Current functional level related to goals / functional outcomes: See above.     Remaining deficits: Goals partially met as above.     Education / Equipment: HEP.   Patient agrees to  discharge. Patient goals were partially met. Patient is being discharged due to completing course of PT.   Keishaun Hazel, [place], PT 11/07/2023, 3:12 PM

## 2023-11-09 ENCOUNTER — Inpatient Hospital Stay: Attending: Oncology

## 2023-11-09 DIAGNOSIS — Z887 Allergy status to serum and vaccine status: Secondary | ICD-10-CM | POA: Diagnosis not present

## 2023-11-09 DIAGNOSIS — I2699 Other pulmonary embolism without acute cor pulmonale: Secondary | ICD-10-CM | POA: Diagnosis not present

## 2023-11-09 DIAGNOSIS — Z7901 Long term (current) use of anticoagulants: Secondary | ICD-10-CM | POA: Insufficient documentation

## 2023-11-09 DIAGNOSIS — Z880 Allergy status to penicillin: Secondary | ICD-10-CM | POA: Diagnosis not present

## 2023-11-09 DIAGNOSIS — K589 Irritable bowel syndrome without diarrhea: Secondary | ICD-10-CM | POA: Insufficient documentation

## 2023-11-09 DIAGNOSIS — R7989 Other specified abnormal findings of blood chemistry: Secondary | ICD-10-CM | POA: Diagnosis not present

## 2023-11-09 DIAGNOSIS — Z79899 Other long term (current) drug therapy: Secondary | ICD-10-CM | POA: Diagnosis not present

## 2023-11-09 DIAGNOSIS — Z86711 Personal history of pulmonary embolism: Secondary | ICD-10-CM | POA: Diagnosis not present

## 2023-11-09 DIAGNOSIS — I8290 Acute embolism and thrombosis of unspecified vein: Secondary | ICD-10-CM

## 2023-11-09 LAB — D-DIMER, QUANTITATIVE: D-Dimer, Quant: 0.85 ug{FEU}/mL — ABNORMAL HIGH (ref 0.00–0.50)

## 2023-11-10 ENCOUNTER — Ambulatory Visit (INDEPENDENT_AMBULATORY_CARE_PROVIDER_SITE_OTHER): Admitting: Family Medicine

## 2023-11-10 ENCOUNTER — Encounter: Payer: Self-pay | Admitting: Family Medicine

## 2023-11-10 VITALS — BP 123/73 | HR 59 | Temp 98.2°F | Ht 60.0 in | Wt 119.4 lb

## 2023-11-10 DIAGNOSIS — I2699 Other pulmonary embolism without acute cor pulmonale: Secondary | ICD-10-CM | POA: Diagnosis not present

## 2023-11-10 DIAGNOSIS — I471 Supraventricular tachycardia, unspecified: Secondary | ICD-10-CM | POA: Diagnosis not present

## 2023-11-10 DIAGNOSIS — G8929 Other chronic pain: Secondary | ICD-10-CM

## 2023-11-10 DIAGNOSIS — M25512 Pain in left shoulder: Secondary | ICD-10-CM

## 2023-11-10 NOTE — Progress Notes (Signed)
 Subjective: CC: Follow-up PE, SVT PCP: Jolinda Norene HERO, DO YEP:Lori Soto is a 88 y.o. female presenting to clinic today for:  1.  PE, SVT; idiopathic scoliosis; left shoulder issues Patient has seen both hematology and cardiology.  At her last cardiology visit her cardiologist reduce Eliquis  to 2.5 milligrams twice daily and actually recommended that she continue it indefinitely.  However, after discussion with her oncologist they were amenable to potentially discontinuing it if she had 2 consecutive D-dimers that were negative.  She has follow-up with them on 17 July.  She reports that she has been doing okay.  She has been working with physical therapy for her scoliosis but never heard from the scoliosis clinic to set up an appointment.  She just finished her last PT session but they recommended that she consider following up for her left shoulder as she has had quite a bit of difficulty raising that arm due to pain and reduction in range of motion.  No preceding injury but she did sustain a fall last year where she fell on an outstretched arm   ROS: Per HPI  Allergies  Allergen Reactions   Penicillins Itching, Swelling and Rash   Covid-19 (Mrna) Vaccine Rash    Told by PCP to never take again.   Influenza Virus Vaccine Rash    Told by PCP to never take again   Tape Itching   Past Medical History:  Diagnosis Date   Anemia    Arthritis    Cancer (HCC)    skin cancer on nose   Carpal tunnel syndrome, bilateral    Cataract    Esophagitis    GERD (gastroesophageal reflux disease)    H/O hiatal hernia    Osteopenia    Scoliosis    Shingles    SVT (supraventricular tachycardia) (HCC)    Ulcerative colitis     Current Outpatient Medications:    Acetaminophen  (TYLENOL  PO), Take 1 tablet by mouth daily as needed (pain, headache)., Disp: , Rfl:    ALPHA LIPOIC ACID PO, Take 1 capsule by mouth daily., Disp: , Rfl:    apixaban  (ELIQUIS ) 2.5 MG TABS tablet, Take 1  tablet (2.5 mg total) by mouth 2 (two) times daily., Disp: 60 tablet, Rfl: 6   Cyanocobalamin  (VITAMIN B 12 PO), Take 1 tablet by mouth daily., Disp: , Rfl:    ferrous sulfate  325 (65 FE) MG tablet, Take 1 tablet (325 mg total) by mouth daily with breakfast., Disp: 60 tablet, Rfl: 0   Mesalamine  (ASACOL ) 400 MG CPDR DR capsule, TAKE 2 CAPSULES BY MOUTH DAILY, Disp: 180 capsule, Rfl: 3   metoprolol  tartrate (LOPRESSOR ) 25 MG tablet, Take 1 tablet (25 mg total) by mouth 2 (two) times daily., Disp: 90 tablet, Rfl: 3   Multiple Vitamins-Minerals (MULTIVITAMIN WOMEN 50+) TABS, Take 1 tablet by mouth daily., Disp: , Rfl:    Multiple Vitamins-Minerals (ZINC PO), Take 1 tablet by mouth daily., Disp: , Rfl:    Nutritional Supplements (GRAPESEED EXTRACT PO), Take 1 capsule by mouth daily., Disp: , Rfl:    pantoprazole  (PROTONIX ) 40 MG tablet, Take 1 tablet (40 mg total) by mouth daily before breakfast., Disp: 60 tablet, Rfl: 0   Pyridoxine HCl (VITAMIN B-6 PO), Take 1 tablet by mouth daily., Disp: , Rfl:    Sodium Hyaluronate, oral, (HYALURONIC ACID PO), Take 1 capsule by mouth daily., Disp: , Rfl:    TURMERIC CURCUMIN PO, Take 1 tablet by mouth 2 (two) times daily., Disp: ,  Rfl:    OVER THE COUNTER MEDICATION, Take 1 capsule by mouth daily. OTC : CoQ10 + red yeast rice, Disp: , Rfl:  Social History   Socioeconomic History   Marital status: Widowed    Spouse name: Not on file   Number of children: 3   Years of education: Not on file   Highest education level: Bachelor's degree (e.g., BA, AB, BS)  Occupational History    Employer: RETIRED    Comment: accounting  Tobacco Use   Smoking status: Never   Smokeless tobacco: Never   Tobacco comments:    Never smoker  Vaping Use   Vaping status: Never Used  Substance and Sexual Activity   Alcohol use: Yes    Alcohol/week: 0.0 standard drinks of alcohol    Comment: Very Rarely   Drug use: No   Sexual activity: Not Currently    Birth  control/protection: Surgical  Other Topics Concern   Not on file  Social History Narrative   Lives alone in 2 story home. Still very independent   Social Drivers of Corporate investment banker Strain: Low Risk  (06/20/2023)   Overall Financial Resource Strain (CARDIA)    Difficulty of Paying Living Expenses: Not hard at all  Food Insecurity: No Food Insecurity (08/03/2023)   Hunger Vital Sign    Worried About Running Out of Food in the Last Year: Never true    Ran Out of Food in the Last Year: Never true  Transportation Needs: No Transportation Needs (08/03/2023)   PRAPARE - Administrator, Civil Service (Medical): No    Lack of Transportation (Non-Medical): No  Physical Activity: Insufficiently Active (06/20/2023)   Exercise Vital Sign    Days of Exercise per Week: 3 days    Minutes of Exercise per Session: 30 min  Stress: No Stress Concern Present (06/20/2023)   [employer] of Occupational Health - Occupational Stress Questionnaire    Feeling of Stress : Not at all  Social Connections: Moderately Integrated (06/20/2023)   Social Connection and Isolation Panel    Frequency of Communication with Friends and Family: More than three times a week    Frequency of Social Gatherings with Friends and Family: Three times a week    Attends Religious Services: More than 4 times per year    Active Member of Clubs or Organizations: Yes    Attends Banker Meetings: More than 4 times per year    Marital Status: Widowed  Intimate Partner Violence: Not At Risk (08/03/2023)   Humiliation, Afraid, Rape, and Kick questionnaire    Fear of Current or Ex-Partner: No    Emotionally Abused: No    Physically Abused: No    Sexually Abused: No   Family History  Problem Relation Age of Onset   Arthritis Mother    Heart disease Mother    Cancer Father        pancreat   Pancreatic cancer Father    Arthritis Father        back issues    Cancer Brother        lung and liver    Liver cancer Brother    Lung cancer Brother    Heart disease Sister        Rheumatic Fever   Peptic Ulcer Sister    Diabetes Sister    Colon cancer Sister    Edema Sister    GI problems Sister        diverticulitis  Diabetes Sister    Neuropathy Sister    COPD Sister    Hiatal hernia Sister    GI problems Sister     Objective: Office vital signs reviewed. BP 123/73   Pulse (!) 59   Temp 98.2 F (36.8 C) (Temporal)   Ht 5' (1.524 m)   Wt 119 lb 6.4 oz (54.2 kg)   SpO2 98%   BMI 23.32 kg/m   Physical Examination:  General: Awake, alert, well nourished, No acute distress HEENT: Sclera white.  Moist mucous membranes Cardio: Cardiac rate with regular rhythm, S1S2 heard, no murmurs appreciated Pulm: clear to auscultation bilaterally, no wheezes, rhonchi or rales; normal work of breathing on room air MSK: Limited active range of motion on the left upper extremity.  Painful arc sign present.  Scoliotic curve noted in the thoracolumbar spine with rib hump deformity and asymmetric leg length  Assessment/ Plan: 88 y.o. female   Other acute pulmonary embolism without acute cor pulmonale (HCC)  Chronic left shoulder pain - Plan: Ambulatory referral to Physical Therapy  SVT (supraventricular tachycardia) (HCC)  I reviewed her notes from cardiology and hematology.  Keep follow-up with hematology.  I anticipate she will likely be on lifelong anticoagulation  Referral to physical therapy placed for left shoulder treatment  Lori Soto CHRISTELLA Fielding, DO [facility] [phone]

## 2023-11-16 ENCOUNTER — Inpatient Hospital Stay (HOSPITAL_BASED_OUTPATIENT_CLINIC_OR_DEPARTMENT_OTHER): Admitting: Oncology

## 2023-11-16 VITALS — BP 137/81 | HR 65 | Temp 97.7°F | Resp 18 | Ht 60.0 in | Wt 121.3 lb

## 2023-11-16 DIAGNOSIS — Z7901 Long term (current) use of anticoagulants: Secondary | ICD-10-CM | POA: Diagnosis not present

## 2023-11-16 DIAGNOSIS — I2699 Other pulmonary embolism without acute cor pulmonale: Secondary | ICD-10-CM

## 2023-11-16 DIAGNOSIS — Z79899 Other long term (current) drug therapy: Secondary | ICD-10-CM | POA: Diagnosis not present

## 2023-11-16 DIAGNOSIS — K589 Irritable bowel syndrome without diarrhea: Secondary | ICD-10-CM | POA: Diagnosis not present

## 2023-11-16 DIAGNOSIS — Z86711 Personal history of pulmonary embolism: Secondary | ICD-10-CM | POA: Diagnosis not present

## 2023-11-16 DIAGNOSIS — R7989 Other specified abnormal findings of blood chemistry: Secondary | ICD-10-CM | POA: Diagnosis not present

## 2023-11-16 NOTE — Assessment & Plan Note (Addendum)
 Patient was diagnosed of unprovoked PE in January 2025.   Risk factors: Old age, IBS Has since been on Eliquis  5 mg twice daily.  Recently cut down to 2.5 mg twice daily No complications or falls. D-dimer at 3 months: Elevated Cancer screening: colo guard test: Negative Repeat D-dimer test still elevated.  - Patient has no indication for thrombophilia testing since she is 87 years and has slight risk factors of IBS and old age. - Patient is currently taking Eliquis  2.5 mg twice daily.  Recommended to continue this. - Discussed that in the setting of unprovoked clot, with elevated D-dimer, patient would require lifelong anticoagulation.  Recommended to continue 2.5 mg twice daily.  Patient does not have further hematological needs at this time.  Will discharge from hematology clinic to be followed with primary care.  Recommended patient to reach out to us  in future with questions or concerns.

## 2023-11-16 NOTE — Progress Notes (Signed)
 [facility] at [facility]  HEMATOLOGY FOLLOW-UP VISIT  Lori Potter M, DO  REASON FOR FOLLOW-UP: Venous thromboembolism   ASSESSMENT & PLAN:  Patient is a 88 y.o. female following for unprovoked venous thromboembolism  Assessment & Plan Other acute pulmonary embolism, unspecified whether acute cor pulmonale present [facility]) Patient was diagnosed of unprovoked PE in January 2025.   Risk factors: Old age, IBS Has since been on Eliquis  5 mg twice daily.  Recently cut down to 2.5 mg twice daily No complications or falls. D-dimer at 3 months: Elevated Cancer screening: colo guard test: Negative Repeat D-dimer test still elevated.  - Patient has no indication for thrombophilia testing since she is 87 years and has slight risk factors of IBS and old age. - Patient is currently taking Eliquis  2.5 mg twice daily.  Recommended to continue this. - Discussed that in the setting of unprovoked clot, with elevated D-dimer, patient would require lifelong anticoagulation.  Recommended to continue 2.5 mg twice daily.  Patient does not have further hematological needs at this time.  Will discharge from hematology clinic to be followed with primary care.  Recommended patient to reach out to us  in future with questions or concerns.    No orders of the defined types were placed in this encounter.   The total time spent in the appointment was 20 minutes encounter with patients including review of chart and various tests results, discussions about plan of care and coordination of care plan   All questions were answered. The patient knows to call the clinic with any problems, questions or concerns. No barriers to learning was detected.  Mickiel Dry, MD 7/17/20251:31 PM   SUMMARY OF HEMATOLOGIC HISTORY: 05/06/2023: 1st episode: B/L PE Presentation: Dyspnea Risk factors: IBD Current treatment: Eliquis  2.5 mg BID.  Patient completed 3 months of Eliquis  5 mg twice daily  and was cut to 2.5 mg twice daily by cardiology D-dimer at 70-months and 6 months: Elevated   INTERVAL HISTORY: Lori Soto 88 y.o. female following for unprovoked venous thromboembolism.  She was accompanied by a family friend today.  She has no complaints and is tolerating Eliquis  very well.  We discussed that her D-dimer is still elevated and she will require lifelong anticoagulation.  Patient agrees with the plan.  I have reviewed the past medical history, past surgical history, social history and family history with the patient   ALLERGIES:  is allergic to penicillins, covid-19 (mrna) vaccine, influenza virus vaccine, and tape.  MEDICATIONS:  Current Outpatient Medications  Medication Sig Dispense Refill   Acetaminophen  (TYLENOL  PO) Take 1 tablet by mouth daily as needed (pain, headache).     ALPHA LIPOIC ACID PO Take 1 capsule by mouth daily.     apixaban  (ELIQUIS ) 2.5 MG TABS tablet Take 1 tablet (2.5 mg total) by mouth 2 (two) times daily. 60 tablet 6   Cyanocobalamin  (VITAMIN B 12 PO) Take 1 tablet by mouth daily.     ferrous sulfate  325 (65 FE) MG tablet Take 1 tablet (325 mg total) by mouth daily with breakfast. 60 tablet 0   Mesalamine  (ASACOL ) 400 MG CPDR DR capsule TAKE 2 CAPSULES BY MOUTH DAILY 180 capsule 3   metoprolol  tartrate (LOPRESSOR ) 25 MG tablet Take 1 tablet (25 mg total) by mouth 2 (two) times daily. 90 tablet 3   Multiple Vitamins-Minerals (MULTIVITAMIN WOMEN 50+) TABS Take 1 tablet by mouth daily.     Multiple Vitamins-Minerals (ZINC PO) Take 1 tablet by  mouth daily.     Nutritional Supplements (GRAPESEED EXTRACT PO) Take 1 capsule by mouth daily.     OVER THE COUNTER MEDICATION Take 1 capsule by mouth daily. OTC : CoQ10 + red yeast rice     pantoprazole  (PROTONIX ) 40 MG tablet Take 1 tablet (40 mg total) by mouth daily before breakfast. 60 tablet 0   Pyridoxine HCl (VITAMIN B-6 PO) Take 1 tablet by mouth daily.     Sodium Hyaluronate, oral, (HYALURONIC  ACID PO) Take 1 capsule by mouth daily.     TURMERIC CURCUMIN PO Take 1 tablet by mouth 2 (two) times daily.     No current facility-administered medications for this visit.     REVIEW OF SYSTEMS:   Constitutional: Denies fevers, chills or night sweats Eyes: Denies blurriness of vision Ears, nose, mouth, throat, and face: Denies mucositis or sore throat Respiratory: Denies cough, dyspnea or wheezes Cardiovascular: Denies palpitation, chest discomfort or lower extremity swelling Gastrointestinal:  Denies nausea, heartburn or change in bowel habits Skin: Denies abnormal skin rashes Lymphatics: Denies new lymphadenopathy or easy bruising Neurological:Denies numbness, tingling or new weaknesses Behavioral/Psych: Mood is stable, no new changes  All other systems were reviewed with the patient and are negative.  PHYSICAL EXAMINATION: Today's Vitals   11/16/23 1308 11/16/23 1323  BP:  137/81  Pulse:  65  Resp:  18  Temp:  97.7 F (36.5 C)  TempSrc:  Oral  SpO2:  98%  Weight:  121 lb 4.1 oz (55 kg)  Height:  5' (1.524 Soto)  PainSc: 5     Body mass index is 23.68 kg/Soto.   GENERAL:alert, no distress and comfortable SKIN: skin color, texture, turgor are normal, no rashes or significant lesions LUNGS: clear to auscultation and percussion with normal breathing effort HEART: regular rate & rhythm and no murmurs and no lower extremity edema ABDOMEN:abdomen soft, non-tender and normal bowel sounds Musculoskeletal:no cyanosis of digits and no clubbing, multiple varicose veins on bilateral legs NEURO: alert & oriented x 3 with fluent speech  LABORATORY DATA:  I have reviewed the data as listed  Lab Results  Component Value Date   WBC 5.0 07/10/2023   NEUTROABS 3.5 07/10/2023   HGB 14.6 07/10/2023   HCT 44.9 07/10/2023   MCV 90.5 07/10/2023   PLT 243 07/10/2023       Chemistry      Component Value Date/Time   NA 140 07/10/2023 1033   NA 144 06/29/2023 1055   K 4.1 07/10/2023  1033   CL 102 07/10/2023 1033   CO2 27 07/10/2023 1033   BUN 10 07/10/2023 1033   BUN 11 06/29/2023 1055   CREATININE 0.75 07/10/2023 1033   CREATININE 0.69 05/14/2013 1239      Component Value Date/Time   CALCIUM  9.4 07/10/2023 1033   ALKPHOS 45 06/29/2023 1055   AST 25 06/29/2023 1055   ALT 19 06/29/2023 1055   BILITOT 0.6 06/29/2023 1055      Latest Reference Range & Units 08/03/23 11:23 11/09/23 13:29  D-Dimer, Quant 0.00 - 0.50 ug/mL-FEU 0.86 (H) 0.85 (H)  (H): Data is abnormally high   Latest Reference Range & Units 08/10/23 14:05  Fecal Occult Blood, POC NEGATIVE  NEGATIVE

## 2023-11-29 ENCOUNTER — Encounter: Payer: Self-pay | Admitting: Physical Therapy

## 2023-11-29 ENCOUNTER — Ambulatory Visit: Attending: Family Medicine | Admitting: Physical Therapy

## 2023-11-29 ENCOUNTER — Other Ambulatory Visit: Payer: Self-pay

## 2023-11-29 DIAGNOSIS — G8929 Other chronic pain: Secondary | ICD-10-CM | POA: Insufficient documentation

## 2023-11-29 DIAGNOSIS — M25612 Stiffness of left shoulder, not elsewhere classified: Secondary | ICD-10-CM | POA: Insufficient documentation

## 2023-11-29 DIAGNOSIS — M25512 Pain in left shoulder: Secondary | ICD-10-CM | POA: Insufficient documentation

## 2023-11-29 NOTE — Therapy (Signed)
 OUTPATIENT PHYSICAL THERAPY SHOULDER EVALUATION   Patient Name: Lori Soto MRN: [id number] DOB:1935/05/11, 88 y.o., female Today's Date: 11/29/2023  END OF SESSION:  PT End of Session - 11/29/23 1329     Visit Number 1    Number of Visits 12    Date for PT Re-Evaluation 01/10/24    PT Start Time 0109    PT Stop Time 0157    PT Time Calculation (min) 48 min    Activity Tolerance Patient tolerated treatment well    Behavior During Therapy WFL for tasks assessed/performed          Past Medical History:  Diagnosis Date   Anemia    Arthritis    Cancer (HCC)    skin cancer on nose   Carpal tunnel syndrome, bilateral    Cataract    Esophagitis    GERD (gastroesophageal reflux disease)    H/O hiatal hernia    Osteopenia    Scoliosis    Shingles    SVT (supraventricular tachycardia) (HCC)    Ulcerative colitis    Past Surgical History:  Procedure Laterality Date   ABDOMINAL HYSTERECTOMY     BUNIONECTOMY     Bilateral   CARPAL TUNNEL RELEASE Left 03/10/2015   Procedure: LEFT CARPAL TUNNEL RELEASE;  Surgeon: Arley Curia, MD;  Location: [facility];  Service: Orthopedics;  Laterality: Left;   CARPAL TUNNEL RELEASE Right 10/31/2017   Procedure: RIGHT CARPAL TUNNEL RELEASE;  Surgeon: Curia Arley, MD;  Location: [facility];  Service: Orthopedics;  Laterality: Right;   CATARACT EXTRACTION W/PHACO  05/28/2012   Procedure: CATARACT EXTRACTION PHACO AND INTRAOCULAR LENS PLACEMENT (IOC);  Surgeon: Cherene Mania, MD;  Location: [facility];  Service: Ophthalmology;  Laterality: Right;  CDE=12.84   CATARACT EXTRACTION W/PHACO  06/07/2012   Procedure: CATARACT EXTRACTION PHACO AND INTRAOCULAR LENS PLACEMENT (IOC);  Surgeon: Cherene Mania, MD;  Location: [facility];  Service: Ophthalmology;  Laterality: Left;  CDE 17.60   COLONOSCOPY     COLONOSCOPY N/A 07/08/2015   Procedure: COLONOSCOPY;  Surgeon: Claudis RAYMOND Rivet, MD;  Location: [facility];  Service: Endoscopy;   Laterality: N/A;  930   ESOPHAGEAL DILATION N/A 07/08/2015   Procedure: ESOPHAGEAL DILATION;  Surgeon: Claudis RAYMOND Rivet, MD;  Location: [facility];  Service: Endoscopy;  Laterality: N/A;   ESOPHAGOGASTRODUODENOSCOPY N/A 07/08/2015   Procedure: ESOPHAGOGASTRODUODENOSCOPY (EGD);  Surgeon: Claudis RAYMOND Rivet, MD;  [facility]: [facility];  Service: Endoscopy;  Laterality: N/A;   Hernia     Right inguinal   HERNIA REPAIR  1961   right inguinal hernia   UPPER GASTROINTESTINAL ENDOSCOPY     Patient Active Problem List   Diagnosis Date Noted   Hospital discharge follow-up 05/17/2023   Pulmonary embolism (HCC) 05/06/2023   Murmur 03/07/2023   History of supraventricular tachycardia 11/08/2021   Arthritis 06/13/2019   GERD (gastroesophageal reflux disease) 06/10/2019   SVT (supraventricular tachycardia) (HCC) 08/28/2018   Light headedness 05/09/2018   Carpal tunnel syndrome of right wrist 05/20/2015   Primary localized osteoarthrosis, hand 05/20/2015   Carpal tunnel syndrome on left 03/18/2015   Osteopenia of the elderly 02/19/2014   DDD (degenerative disc disease), lumbar 12/03/2013   Scoliosis 12/03/2013   Chronic low back pain 05/14/2013   IBS (irritable bowel syndrome) 11/08/2012   Hypertension 10/26/2011   Chronic ulcerative colitis (HCC) 03/23/2011   REFERRING PROVIDER: Norene Fielding DO.  REFERRING DIAG: Chronic left shoulder pain.  THERAPY DIAG:  Chronic left  shoulder pain - Plan: PT plan of care cert/re-cert  Stiffness of left shoulder, not elsewhere classified - Plan: PT plan of care cert/re-cert  Rationale for Evaluation and Treatment: Rehabilitation  ONSET DATE: Ongoing.    SUBJECTIVE:                                                                                                                                                                                      SUBJECTIVE STATEMENT: The patient presents to the clinic with c/o chronic left shoulder pain.  She has pain  at rest at approximately a 2-3/10 and much higher with movement.  She cannot sleep on her left shoulder.    PERTINENT HISTORY: Please see above.    PAIN:  Are you having pain? Yes: NPRS scale: 2-3/10 at rest today.   Pain location: Left shoulder. Pain description: Ache and throb.   Aggravating factors: Movement.   Relieving factors: Resting with arm by side.  PRECAUTIONS: None  RED FLAGS: None   WEIGHT BEARING RESTRICTIONS: No  FALLS:  Has patient fallen in last 6 months? No  LIVING ENVIRONMENT: Lives with: lives alone Lives in: House/apartment Has following equipment at home: None  OCCUPATION: Retired.    PLOF: Independent  PATIENT GOALS:Improve left shoulder motion and do more with less pain.    NEXT MD VISIT:   OBJECTIVE:   PATIENT SURVEYS:  Quick DASH:  79.54   POSTURE: Round shoulders.  UPPER EXTREMITY ROM:   Left shoulder active antigravity flexion to 105 degrees and ER is 28 degrees assessed in supine.  Significant crepitus noted.    UPPER EXTREMITY MMT:  Left shoulder flexion is 4- to 4/5, IR/ER strength is a solid 4/5 when tested with elbow by side.    SHOULDER SPECIAL TESTS: Positive impingement testing and negative Drop Arm test.    PALPATION:  Tender to palpation over left middle deltoid and posterior cuff musculature.                                                                                                                               TREATMENT DATE:  11/29/23:  IFC at 80-150 Hz on 40% scan x 20 minutes to patient's left shoulder.  Normal modality response following removal of modality      PATIENT EDUCATION: Education details:  Person educated:  International aid/development worker:  Education comprehension:   HOME EXERCISE PROGRAM:   ASSESSMENT:  CLINICAL IMPRESSION: The patient presents to [facility] with c/o chronic left shoulder pain and loss of motion.  She has a significant loss of range of motion.  She demonstrates a positive Impingement  test of her left shoulder.  Her Quick DASH score is 79.54.   Patient will benefit from skilled PT intervention to address pain and deficits.   OBJECTIVE IMPAIRMENTS: decreased activity tolerance, decreased ROM, decreased strength, increased muscle spasms, and pain.   ACTIVITY LIMITATIONS: carrying and reach over head  PARTICIPATION LIMITATIONS: meal prep, cleaning, and laundry  PERSONAL FACTORS: Time since onset of injury/illness/exacerbation are also affecting patient's functional outcome.   REHAB POTENTIAL: Fair plus/Good minus  CLINICAL DECISION MAKING: Stable/uncomplicated  EVALUATION COMPLEXITY: Low   GOALS:  SHORT TERM GOALS: Target date: 12/13/23  Ind with an initial HEP. Goal status: INITIAL    LONG TERM GOALS: Target date: 01/10/24  Ind with an advanced HEP.  Goal status: INITIAL  2.  Active left shoulder flexion to 125 degrees so she can reach into an overhead cabinet.  Goal status: INITIAL  3.  Active ER to 50-55 degrees to make donning/doffing apparel easier.   Baseline:  Goal status: INITIAL  4.  Improve Quck DASH score by at least 25%. Baseline:    PLAN:  PT FREQUENCY: 2x/week  PT DURATION: 6 weeks  PLANNED INTERVENTIONS: 97110-Therapeutic exercises, 97530- Therapeutic activity, [id number]- Neuromuscular re-education, 97535- Self Care, 02859- Manual therapy, [id number]- Electrical stimulation (unattended), 97016- Vasopneumatic device, 97035- Ultrasound, Patient/Family education, Cryotherapy, and Moist heat  PLAN FOR NEXT SESSION: Combo e'stim, STW/M, pulleys, UE Ranger, wall ladder.     Rudene Poulsen, [place], PT 11/29/2023, 2:30 PM

## 2023-12-04 ENCOUNTER — Ambulatory Visit: Attending: Family Medicine | Admitting: Physical Therapy

## 2023-12-04 DIAGNOSIS — G8929 Other chronic pain: Secondary | ICD-10-CM | POA: Insufficient documentation

## 2023-12-04 DIAGNOSIS — M25512 Pain in left shoulder: Secondary | ICD-10-CM | POA: Insufficient documentation

## 2023-12-04 DIAGNOSIS — M62838 Other muscle spasm: Secondary | ICD-10-CM | POA: Diagnosis not present

## 2023-12-04 DIAGNOSIS — M25612 Stiffness of left shoulder, not elsewhere classified: Secondary | ICD-10-CM | POA: Diagnosis not present

## 2023-12-04 DIAGNOSIS — M62830 Muscle spasm of back: Secondary | ICD-10-CM | POA: Diagnosis not present

## 2023-12-04 NOTE — Therapy (Signed)
 OUTPATIENT PHYSICAL THERAPY SHOULDER TREATMENT   Patient Name: Lori Soto MRN: [id number] DOB:1935/06/16, 88 y.o., female Today's Date: 12/04/2023  END OF SESSION:  PT End of Session - 12/04/23 1559     Visit Number 2    Number of Visits 12    Date for PT Re-Evaluation 01/10/24    PT Start Time 0314    PT Stop Time 0410    PT Time Calculation (min) 56 min    Activity Tolerance Patient tolerated treatment well    Behavior During Therapy WFL for tasks assessed/performed          Past Medical History:  Diagnosis Date   Anemia    Arthritis    Cancer (HCC)    skin cancer on nose   Carpal tunnel syndrome, bilateral    Cataract    Esophagitis    GERD (gastroesophageal reflux disease)    H/O hiatal hernia    Osteopenia    Scoliosis    Shingles    SVT (supraventricular tachycardia) (HCC)    Ulcerative colitis    Past Surgical History:  Procedure Laterality Date   ABDOMINAL HYSTERECTOMY     BUNIONECTOMY     Bilateral   CARPAL TUNNEL RELEASE Left 03/10/2015   Procedure: LEFT CARPAL TUNNEL RELEASE;  Surgeon: Arley Curia, MD;  Location: [facility];  Service: Orthopedics;  Laterality: Left;   CARPAL TUNNEL RELEASE Right 10/31/2017   Procedure: RIGHT CARPAL TUNNEL RELEASE;  Surgeon: Curia Arley, MD;  Location: [facility];  Service: Orthopedics;  Laterality: Right;   CATARACT EXTRACTION W/PHACO  05/28/2012   Procedure: CATARACT EXTRACTION PHACO AND INTRAOCULAR LENS PLACEMENT (IOC);  Surgeon: Cherene Mania, MD;  Location: [facility];  Service: Ophthalmology;  Laterality: Right;  CDE=12.84   CATARACT EXTRACTION W/PHACO  06/07/2012   Procedure: CATARACT EXTRACTION PHACO AND INTRAOCULAR LENS PLACEMENT (IOC);  Surgeon: Cherene Mania, MD;  Location: [facility];  Service: Ophthalmology;  Laterality: Left;  CDE 17.60   COLONOSCOPY     COLONOSCOPY N/A 07/08/2015   Procedure: COLONOSCOPY;  Surgeon: Claudis RAYMOND Rivet, MD;  [facility];  Service: Endoscopy;   Laterality: N/A;  930   ESOPHAGEAL DILATION N/A 07/08/2015   Procedure: ESOPHAGEAL DILATION;  Surgeon: Claudis RAYMOND Rivet, MD;  [facility]: [facility];  Service: Endoscopy;  Laterality: N/A;   ESOPHAGOGASTRODUODENOSCOPY N/A 07/08/2015   Procedure: ESOPHAGOGASTRODUODENOSCOPY (EGD);  Surgeon: Claudis RAYMOND Rivet, MD;  Location: [facility];  Service: Endoscopy;  Laterality: N/A;   Hernia     Right inguinal   HERNIA REPAIR  1961   right inguinal hernia   UPPER GASTROINTESTINAL ENDOSCOPY     Patient Active Problem List   Diagnosis Date Noted   Hospital discharge follow-up 05/17/2023   Pulmonary embolism (HCC) 05/06/2023   Murmur 03/07/2023   History of supraventricular tachycardia 11/08/2021   Arthritis 06/13/2019   GERD (gastroesophageal reflux disease) 06/10/2019   SVT (supraventricular tachycardia) (HCC) 08/28/2018   Light headedness 05/09/2018   Carpal tunnel syndrome of right wrist 05/20/2015   Primary localized osteoarthrosis, hand 05/20/2015   Carpal tunnel syndrome on left 03/18/2015   Osteopenia of the elderly 02/19/2014   DDD (degenerative disc disease), lumbar 12/03/2013   Scoliosis 12/03/2013   Chronic low back pain 05/14/2013   IBS (irritable bowel syndrome) 11/08/2012   Hypertension 10/26/2011   Chronic ulcerative colitis (HCC) 03/23/2011   REFERRING PROVIDER: Norene Fielding DO.  REFERRING DIAG: Chronic left shoulder pain.  THERAPY DIAG:  Chronic left  shoulder pain  Stiffness of left shoulder, not elsewhere classified  Muscle spasm of back  Rationale for Evaluation and Treatment: Rehabilitation  ONSET DATE: Ongoing.    SUBJECTIVE:                                                                                                                                                                                      SUBJECTIVE STATEMENT: No new complaints.  PERTINENT HISTORY: Please see above.    PAIN:  Are you having pain? Yes: NPRS scale: 2-3/10 at rest today.    Pain location: Left shoulder. Pain description: Ache and throb.   Aggravating factors: Movement.   Relieving factors: Resting with arm by side.  PRECAUTIONS: None  RED FLAGS: None   WEIGHT BEARING RESTRICTIONS: No  FALLS:  Has patient fallen in last 6 months? No  LIVING ENVIRONMENT: Lives with: lives alone Lives in: House/apartment Has following equipment at home: None  OCCUPATION: Retired.    PLOF: Independent  PATIENT GOALS:Improve left shoulder motion and do more with less pain.    NEXT MD VISIT:   OBJECTIVE:   PATIENT SURVEYS:  Quick DASH:  79.54   POSTURE: Round shoulders.  UPPER EXTREMITY ROM:   Left shoulder active antigravity flexion to 105 degrees and ER is 28 degrees assessed in supine.  Significant crepitus noted.    UPPER EXTREMITY MMT:  Left shoulder flexion is 4- to 4/5, IR/ER strength is a solid 4/5 when tested with elbow by side.    SHOULDER SPECIAL TESTS: Positive impingement testing and negative Drop Arm test.    PALPATION:  Tender to palpation over left middle deltoid and posterior cuff musculature.                                                                                                                               TREATMENT DATE:   12/04/23:  Combo e'stim/US  at 1.50 W/CM2 x 12 minutes f/b STW/M x 15 minutes to patient's left posterior cuff and middle deltoid musculature f/b IFC at 80-150 at 40% scan x 20 minutes. Normal modality response following removal of modality  11/29/23:  IFC at 80-150 Hz on 40% scan x 20 minutes to patient's left shoulder.  Normal modality response following removal of modality      PATIENT EDUCATION: Education details:  Person educated:  International aid/development worker:  Education comprehension:   HOME EXERCISE PROGRAM:   ASSESSMENT:  CLINICAL IMPRESSION: Patient with notable trigger points in her left middle deltoid and posterior cuff musculature with good response to soft tissue work.     OBJECTIVE  IMPAIRMENTS: decreased activity tolerance, decreased ROM, decreased strength, increased muscle spasms, and pain.   ACTIVITY LIMITATIONS: carrying and reach over head  PARTICIPATION LIMITATIONS: meal prep, cleaning, and laundry  PERSONAL FACTORS: Time since onset of injury/illness/exacerbation are also affecting patient's functional outcome.   REHAB POTENTIAL: Fair plus/Good minus  CLINICAL DECISION MAKING: Stable/uncomplicated  EVALUATION COMPLEXITY: Low   GOALS:  SHORT TERM GOALS: Target date: 12/13/23  Ind with an initial HEP. Goal status: INITIAL    LONG TERM GOALS: Target date: 01/10/24  Ind with an advanced HEP.  Goal status: INITIAL  2.  Active left shoulder flexion to 125 degrees so she can reach into an overhead cabinet.  Goal status: INITIAL  3.  Active ER to 50-55 degrees to make donning/doffing apparel easier.   Baseline:  Goal status: INITIAL  4.  Improve Quck DASH score by at least 25%. Baseline:    PLAN:  PT FREQUENCY: 2x/week  PT DURATION: 6 weeks  PLANNED INTERVENTIONS: 97110-Therapeutic exercises, 97530- Therapeutic activity, [id number]- Neuromuscular re-education, 97535- Self Care, 02859- Manual therapy, G0283- Electrical stimulation (unattended), 97016- Vasopneumatic device, 97035- Ultrasound, Patient/Family education, Cryotherapy, and Moist heat  PLAN FOR NEXT SESSION: Combo e'stim, STW/M, pulleys, UE Ranger, wall ladder.     Torsha Lemus, [place], PT 12/04/2023, 4:13 PM

## 2023-12-07 ENCOUNTER — Ambulatory Visit: Admitting: Physical Therapy

## 2023-12-07 ENCOUNTER — Encounter: Payer: Self-pay | Admitting: Physical Therapy

## 2023-12-07 DIAGNOSIS — G8929 Other chronic pain: Secondary | ICD-10-CM | POA: Diagnosis not present

## 2023-12-07 DIAGNOSIS — M25612 Stiffness of left shoulder, not elsewhere classified: Secondary | ICD-10-CM | POA: Diagnosis not present

## 2023-12-07 DIAGNOSIS — M62838 Other muscle spasm: Secondary | ICD-10-CM | POA: Diagnosis not present

## 2023-12-07 DIAGNOSIS — M25512 Pain in left shoulder: Secondary | ICD-10-CM | POA: Diagnosis not present

## 2023-12-07 DIAGNOSIS — M62830 Muscle spasm of back: Secondary | ICD-10-CM | POA: Diagnosis not present

## 2023-12-07 NOTE — Therapy (Signed)
 OUTPATIENT PHYSICAL THERAPY SHOULDER TREATMENT   Patient Name: Lori Soto MRN: [id number] DOB:28-Dec-1935, 88 y.o., female Today's Date: 12/07/2023  END OF SESSION:  PT End of Session - 12/07/23 1518     Visit Number 3    Number of Visits 12    Date for PT Re-Evaluation 01/10/24    PT Start Time 0230    PT Stop Time 0324    PT Time Calculation (min) 54 min    Activity Tolerance Patient tolerated treatment well    Behavior During Therapy WFL for tasks assessed/performed          Past Medical History:  Diagnosis Date   Anemia    Arthritis    Cancer (HCC)    skin cancer on nose   Carpal tunnel syndrome, bilateral    Cataract    Esophagitis    GERD (gastroesophageal reflux disease)    H/O hiatal hernia    Osteopenia    Scoliosis    Shingles    SVT (supraventricular tachycardia) (HCC)    Ulcerative colitis    Past Surgical History:  Procedure Laterality Date   ABDOMINAL HYSTERECTOMY     BUNIONECTOMY     Bilateral   CARPAL TUNNEL RELEASE Left 03/10/2015   Procedure: LEFT CARPAL TUNNEL RELEASE;  Surgeon: Arley Curia, MD;  Location: [facility];  Service: Orthopedics;  Laterality: Left;   CARPAL TUNNEL RELEASE Right 10/31/2017   Procedure: RIGHT CARPAL TUNNEL RELEASE;  Surgeon: Curia Arley, MD;  Location: [facility];  Service: Orthopedics;  Laterality: Right;   CATARACT EXTRACTION W/PHACO  05/28/2012   Procedure: CATARACT EXTRACTION PHACO AND INTRAOCULAR LENS PLACEMENT (IOC);  Surgeon: Cherene Mania, MD;  Location: [facility];  Service: Ophthalmology;  Laterality: Right;  CDE=12.84   CATARACT EXTRACTION W/PHACO  06/07/2012   Procedure: CATARACT EXTRACTION PHACO AND INTRAOCULAR LENS PLACEMENT (IOC);  Surgeon: Cherene Mania, MD;  Location: [facility];  Service: Ophthalmology;  Laterality: Left;  CDE 17.60   COLONOSCOPY     COLONOSCOPY N/A 07/08/2015   Procedure: COLONOSCOPY;  Surgeon: Claudis RAYMOND Rivet, MD;  [facility];  Service: Endoscopy;   Laterality: N/A;  930   ESOPHAGEAL DILATION N/A 07/08/2015   Procedure: ESOPHAGEAL DILATION;  Surgeon: Claudis RAYMOND Rivet, MD;  [facility];  Service: Endoscopy;  Laterality: N/A;   ESOPHAGOGASTRODUODENOSCOPY N/A 07/08/2015   Procedure: ESOPHAGOGASTRODUODENOSCOPY (EGD);  Surgeon: Claudis RAYMOND Rivet, MD;  [facility];  Service: Endoscopy;  Laterality: N/A;   Hernia     Right inguinal   HERNIA REPAIR  1961   right inguinal hernia   UPPER GASTROINTESTINAL ENDOSCOPY     Patient Active Problem List   Diagnosis Date Noted   Hospital discharge follow-up 05/17/2023   Pulmonary embolism (HCC) 05/06/2023   Murmur 03/07/2023   History of supraventricular tachycardia 11/08/2021   Arthritis 06/13/2019   GERD (gastroesophageal reflux disease) 06/10/2019   SVT (supraventricular tachycardia) (HCC) 08/28/2018   Light headedness 05/09/2018   Carpal tunnel syndrome of right wrist 05/20/2015   Primary localized osteoarthrosis, hand 05/20/2015   Carpal tunnel syndrome on left 03/18/2015   Osteopenia of the elderly 02/19/2014   DDD (degenerative disc disease), lumbar 12/03/2013   Scoliosis 12/03/2013   Chronic low back pain 05/14/2013   IBS (irritable bowel syndrome) 11/08/2012   Hypertension 10/26/2011   Chronic ulcerative colitis (HCC) 03/23/2011   REFERRING PROVIDER: Norene Fielding DO.  REFERRING DIAG: Chronic left shoulder pain.  THERAPY DIAG:  Chronic left  shoulder pain  Stiffness of left shoulder, not elsewhere classified  Rationale for Evaluation and Treatment: Rehabilitation  ONSET DATE: Ongoing.    SUBJECTIVE:                                                                                                                                                                                      SUBJECTIVE STATEMENT: Pain about a 4-5/10.   PERTINENT HISTORY: Please see above.    PAIN:  Are you having pain? Yes: NPRS scale: 4-5/10 at rest today.   Pain location: Left  shoulder. Pain description: Ache and throb.   Aggravating factors: Movement.   Relieving factors: Resting with arm by side.  PRECAUTIONS: None  RED FLAGS: None   WEIGHT BEARING RESTRICTIONS: No  FALLS:  Has patient fallen in last 6 months? No  LIVING ENVIRONMENT: Lives with: lives alone Lives in: House/apartment Has following equipment at home: None  OCCUPATION: Retired.    PLOF: Independent  PATIENT GOALS:Improve left shoulder motion and do more with less pain.    NEXT MD VISIT:   OBJECTIVE:   PATIENT SURVEYS:  Quick DASH:  79.54   POSTURE: Round shoulders.  UPPER EXTREMITY ROM:   Left shoulder active antigravity flexion to 105 degrees and ER is 28 degrees assessed in supine.  Significant crepitus noted.    UPPER EXTREMITY MMT:  Left shoulder flexion is 4- to 4/5, IR/ER strength is a solid 4/5 when tested with elbow by side.    SHOULDER SPECIAL TESTS: Positive impingement testing and negative Drop Arm test.    PALPATION:  Tender to palpation over left middle deltoid and posterior cuff musculature.                                                                                                                               TREATMENT DATE:    12/07/23:  UBE at 120 RPM's x 6 minutes f/b seated ranger x 4 minutes f/b supine cane exercises (bench press and flexion) 2 minutes each exercise f/b STW/M x 10 minutes f/b IFC at 80-150 Hz on 40% scan x 20 minutes.  Normal modality response following removal of modality  12/04/23:  Combo e'stim/US  at 1.50 W/CM2 x 12 minutes f/b STW/M x 15 minutes to patient's left posterior cuff and middle deltoid musculature f/b IFC at 80-150 at 40% scan x 20 minutes. Normal modality response following removal of modality   11/29/23:  IFC at 80-150 Hz on 40% scan x 20 minutes to patient's left shoulder.  Normal modality response following removal of modality      PATIENT EDUCATION: Education details: See below. Person educated:  Patient. Education method: Demo., tactile, handout. Education comprehension: Ship broker.  HOME EXERCISE PROGRAM: CANE CANE Created by [place] Delrico Minehart Aug 7th, 2025 View at [email or web address] code [id number] WAND FLEXION - SUPINE Lying on your back and holding a wand or cane, slowly raise the wand towards overhead. Use your unaffected arm to assist with the movement. Repeat 15 Times Hold 5 Seconds Complete 2 Sets Perform 4 Times a Day BENCH PRESS - UNWEIGHTED Start with both hands on the cane/rod with your elbows bent and the back of your upper arms resting on the mat. Straighten your elbows and press the cane/rod straight up toward the ceiling. Slowly return to the start position. **Place a small towel roll or pad under your elbow in the start position as needed to stay in a pain free range.** Repeat 10 Times Complete 3 Sets Perform 1 Time a Day  ASSESSMENT:  CLINICAL IMPRESSION: Patient did well with therex today which included supine cane exercises that she is to start as part of a HEP.  She felt better after treatment.     OBJECTIVE IMPAIRMENTS: decreased activity tolerance, decreased ROM, decreased strength, increased muscle spasms, and pain.   ACTIVITY LIMITATIONS: carrying and reach over head  PARTICIPATION LIMITATIONS: meal prep, cleaning, and laundry  PERSONAL FACTORS: Time since onset of injury/illness/exacerbation are also affecting patient's functional outcome.   REHAB POTENTIAL: Fair plus/Good minus  CLINICAL DECISION MAKING: Stable/uncomplicated  EVALUATION COMPLEXITY: Low   GOALS:  SHORT TERM GOALS: Target date: 12/13/23  Ind with an initial HEP. Goal status: INITIAL    LONG TERM GOALS: Target date: 01/10/24  Ind with an advanced HEP.  Goal status: INITIAL  2.  Active left shoulder flexion to 125 degrees so she can reach into an overhead cabinet.  Goal status: INITIAL  3.  Active ER to 50-55 degrees to make donning/doffing apparel easier.    Baseline:  Goal status: INITIAL  4.  Improve Quck DASH score by at least 25%. Baseline:    PLAN:  PT FREQUENCY: 2x/week  PT DURATION: 6 weeks  PLANNED INTERVENTIONS: 97110-Therapeutic exercises, 97530- Therapeutic activity, [id number]- Neuromuscular re-education, 97535- Self Care, 02859- Manual therapy, [id number]- Electrical stimulation (unattended), 97016- Vasopneumatic device, 97035- Ultrasound, Patient/Family education, Cryotherapy, and Moist heat  PLAN FOR NEXT SESSION: Combo e'stim, STW/M, pulleys, UE Ranger, wall ladder.     Marionna Gonia, [place], PT 12/07/2023, 3:59 PM

## 2023-12-13 ENCOUNTER — Ambulatory Visit: Admitting: Physical Therapy

## 2023-12-13 DIAGNOSIS — M62830 Muscle spasm of back: Secondary | ICD-10-CM | POA: Diagnosis not present

## 2023-12-13 DIAGNOSIS — M25612 Stiffness of left shoulder, not elsewhere classified: Secondary | ICD-10-CM | POA: Diagnosis not present

## 2023-12-13 DIAGNOSIS — M25512 Pain in left shoulder: Secondary | ICD-10-CM | POA: Diagnosis not present

## 2023-12-13 DIAGNOSIS — M62838 Other muscle spasm: Secondary | ICD-10-CM | POA: Diagnosis not present

## 2023-12-13 DIAGNOSIS — G8929 Other chronic pain: Secondary | ICD-10-CM

## 2023-12-13 NOTE — Therapy (Addendum)
 OUTPATIENT PHYSICAL THERAPY SHOULDER TREATMENT   Patient Name: Lori Soto MRN: [id number] DOB:[birth year], 88 y.o., female Today's Date: 12/13/2023  END OF SESSION:  PT End of Session - 12/13/23 1139     Visit Number 4    Number of Visits 12    Date for PT Re-Evaluation 01/10/24    PT Start Time 1107    PT Stop Time 1159    PT Time Calculation (min) 52 min    Activity Tolerance Patient tolerated treatment well    Behavior During Therapy WFL for tasks assessed/performed          Past Medical History:  Diagnosis Date   Anemia    Arthritis    Cancer (HCC)    skin cancer on nose   Carpal tunnel syndrome, bilateral    Cataract    Esophagitis    GERD (gastroesophageal reflux disease)    H/O hiatal hernia    Osteopenia    Scoliosis    Shingles    SVT (supraventricular tachycardia) (HCC)    Ulcerative colitis    Past Surgical History:  Procedure Laterality Date   ABDOMINAL HYSTERECTOMY     BUNIONECTOMY     Bilateral   CARPAL TUNNEL RELEASE Left 03/10/2015   Procedure: LEFT CARPAL TUNNEL RELEASE;  Surgeon: Arley Curia, MD;  Location: [facility];  Service: Orthopedics;  Laterality: Left;   CARPAL TUNNEL RELEASE Right 10/31/2017   Procedure: RIGHT CARPAL TUNNEL RELEASE;  Surgeon: Curia Arley, MD;  Location: [facility];  Service: Orthopedics;  Laterality: Right;   CATARACT EXTRACTION W/PHACO  05/28/2012   Procedure: CATARACT EXTRACTION PHACO AND INTRAOCULAR LENS PLACEMENT (IOC);  Surgeon: Cherene Mania, MD;  Location: [facility];  Service: Ophthalmology;  Laterality: Right;  CDE=12.84   CATARACT EXTRACTION W/PHACO  06/07/2012   Procedure: CATARACT EXTRACTION PHACO AND INTRAOCULAR LENS PLACEMENT (IOC);  Surgeon: Cherene Mania, MD;  Location: [facility];  Service: Ophthalmology;  Laterality: Left;  CDE 17.60   COLONOSCOPY     COLONOSCOPY N/A 07/08/2015   Procedure: COLONOSCOPY;  Surgeon: Claudis RAYMOND Rivet, MD;  Location: [facility];  Service: Endoscopy;   Laterality: N/A;  930   ESOPHAGEAL DILATION N/A 07/08/2015   Procedure: ESOPHAGEAL DILATION;  Surgeon: Claudis RAYMOND Rivet, MD;  Location: [facility];  Service: Endoscopy;  Laterality: N/A;   ESOPHAGOGASTRODUODENOSCOPY N/A 07/08/2015   Procedure: ESOPHAGOGASTRODUODENOSCOPY (EGD);  Surgeon: Claudis RAYMOND Rivet, MD;  Location: [facility];  Service: Endoscopy;  Laterality: N/A;   Hernia     Right inguinal   HERNIA REPAIR  1961   right inguinal hernia   UPPER GASTROINTESTINAL ENDOSCOPY     Patient Active Problem List   Diagnosis Date Noted   Hospital discharge follow-up 05/17/2023   Pulmonary embolism (HCC) 05/06/2023   Murmur 03/07/2023   History of supraventricular tachycardia 11/08/2021   Arthritis 06/13/2019   GERD (gastroesophageal reflux disease) 06/10/2019   SVT (supraventricular tachycardia) (HCC) 08/28/2018   Light headedness 05/09/2018   Carpal tunnel syndrome of right wrist 05/20/2015   Primary localized osteoarthrosis, hand 05/20/2015   Carpal tunnel syndrome on left 03/18/2015   Osteopenia of the elderly 02/19/2014   DDD (degenerative disc disease), lumbar 12/03/2013   Scoliosis 12/03/2013   Chronic low back pain 05/14/2013   IBS (irritable bowel syndrome) 11/08/2012   Hypertension 10/26/2011   Chronic ulcerative colitis (HCC) 03/23/2011   REFERRING PROVIDER: Norene Fielding DO.  REFERRING DIAG: Chronic left shoulder pain.  THERAPY DIAG:  Chronic left  shoulder pain  Stiffness of left shoulder, not elsewhere classified  Other muscle spasm  Rationale for Evaluation and Treatment: Rehabilitation  ONSET DATE: Ongoing.    SUBJECTIVE:                                                                                                                                                                                      SUBJECTIVE STATEMENT: Shoulder felt really good for three days after last treatment.   PERTINENT HISTORY: Please see above.    PAIN:  Are you having pain?  Yes: NPRS scale: 4-5/10 at rest today.   Pain location: Left shoulder. Pain description: Ache and throb.   Aggravating factors: Movement.   Relieving factors: Resting with arm by side.  PRECAUTIONS: None  RED FLAGS: None   WEIGHT BEARING RESTRICTIONS: No  FALLS:  Has patient fallen in last 6 months? No  LIVING ENVIRONMENT: Lives with: lives alone Lives in: House/apartment Has following equipment at home: None  OCCUPATION: Retired.    PLOF: Independent  PATIENT GOALS:Improve left shoulder motion and do more with less pain.    NEXT MD VISIT:   OBJECTIVE:   PATIENT SURVEYS:  Quick DASH:  79.54   POSTURE: Round shoulders.  UPPER EXTREMITY ROM:   Left shoulder active antigravity flexion to 105 degrees and ER is 28 degrees assessed in supine.  Significant crepitus noted.    UPPER EXTREMITY MMT:  Left shoulder flexion is 4- to 4/5, IR/ER strength is a solid 4/5 when tested with elbow by side.    SHOULDER SPECIAL TESTS: Positive impingement testing and negative Drop Arm test.    PALPATION:  Tender to palpation over left middle deltoid and posterior cuff musculature.                                                                                                                               TREATMENT DATE:   12/13/23:  In supine:  Gentle PROM x 10 minutes into left shoulder flexion, abd and ER f/b STW/M x 14 minutes to left UT, posterior cuff and middle deltoid musculature f/b Normal modality  response following removal of modality.  12/07/23:  UBE at 120 RPM's x 6 minutes f/b seated ranger x 4 minutes f/b supine cane exercises (bench press and flexion) 2 minutes each exercise f/b STW/M x 10 minutes f/b IFC at 80-150 Hz on 40% scan x 20 minutes. Normal modality response following removal of modality  12/04/23:  Combo e'stim/US  at 1.50 W/CM2 x 12 minutes f/b STW/M x 15 minutes to patient's left posterior cuff and middle deltoid musculature f/b IFC at 80-150 at 40% scan x  20 minutes. Normal modality response following removal of modality   11/29/23:  IFC at 80-150 Hz on 40% scan x 20 minutes to patient's left shoulder.  Normal modality response following removal of modality      PATIENT EDUCATION: Education details: See below. Person educated: Patient. Education method: Demo., tactile, handout. Education comprehension: Ship broker.  HOME EXERCISE PROGRAM: CANE CANE Created by [place] Sapir Lavey Aug 7th, 2025 View at [email or web address] code [id number] WAND FLEXION - SUPINE Lying on your back and holding a wand or cane, slowly raise the wand towards overhead. Use your unaffected arm to assist with the movement. Repeat 15 Times Hold 5 Seconds Complete 2 Sets Perform 4 Times a Day BENCH PRESS - UNWEIGHTED Start with both hands on the cane/rod with your elbows bent and the back of your upper arms resting on the mat. Straighten your elbows and press the cane/rod straight up toward the ceiling. Slowly return to the start position. **Place a small towel roll or pad under your elbow in the start position as needed to stay in a pain free range.** Repeat 10 Times Complete 3 Sets Perform 1 Time a Day  ASSESSMENT:  CLINICAL IMPRESSION: Patient pleased with her progress thus far. Her ROM remains unchanged at this point.    OBJECTIVE IMPAIRMENTS: decreased activity tolerance, decreased ROM, decreased strength, increased muscle spasms, and pain.   ACTIVITY LIMITATIONS: carrying and reach over head  PARTICIPATION LIMITATIONS: meal prep, cleaning, and laundry  PERSONAL FACTORS: Time since onset of injury/illness/exacerbation are also affecting patient's functional outcome.   REHAB POTENTIAL: Fair plus/Good minus  CLINICAL DECISION MAKING: Stable/uncomplicated  EVALUATION COMPLEXITY: Low   GOALS:  SHORT TERM GOALS: Target date: 12/13/23  Ind with an initial HEP. Goal status: INITIAL    LONG TERM GOALS: Target date: 01/10/24  Ind with an  advanced HEP.  Goal status: INITIAL  2.  Active left shoulder flexion to 125 degrees so she can reach into an overhead cabinet.  Goal status: INITIAL  3.  Active ER to 50-55 degrees to make donning/doffing apparel easier.   Baseline:  Goal status: INITIAL  4.  Improve Quck DASH score by at least 25%. Baseline:    PLAN:  PT FREQUENCY: 2x/week  PT DURATION: 6 weeks  PLANNED INTERVENTIONS: 97110-Therapeutic exercises, 97530- Therapeutic activity, [id number]- Neuromuscular re-education, 97535- Self Care, [id number]- Manual therapy, [id number]- Electrical stimulation (unattended), 97016- Vasopneumatic device, 97035- Ultrasound, Patient/Family education, Cryotherapy, and Moist heat  PLAN FOR NEXT SESSION: Combo e'stim, STW/M, pulleys, UE Ranger, wall ladder.     Niobe Dick, [place], PT 12/13/2023, 11:59 AM

## 2023-12-19 ENCOUNTER — Other Ambulatory Visit: Payer: Self-pay | Admitting: Gastroenterology

## 2023-12-19 DIAGNOSIS — K5100 Ulcerative (chronic) pancolitis without complications: Secondary | ICD-10-CM

## 2023-12-28 ENCOUNTER — Ambulatory Visit (INDEPENDENT_AMBULATORY_CARE_PROVIDER_SITE_OTHER): Admitting: Gastroenterology

## 2024-01-08 ENCOUNTER — Telehealth: Payer: Self-pay | Admitting: Cardiology

## 2024-01-08 NOTE — Telephone Encounter (Signed)
 Patient states that she is having a eposide but stated that it is not afib. Patient wants to know if it will be safe for her to get on airplane this weekend. Please advise

## 2024-01-08 NOTE — Telephone Encounter (Signed)
 Spoke with the patient who states that she is not currently having an episode but that she does have them every once in a while and she is concerned about what to do if she has one while flying. Patient with history of SVT that has been to the ER several times for episodes that cannot be controlled with metoprolol . She has been given adenosine  in the past. She would like to know if she can get adenosine  to take with her on the plane. Advised that we are not able to provide that for her and that she should have her metoprolol  with her. She is traveling from [place]  to [place] .

## 2024-01-09 ENCOUNTER — Ambulatory Visit: Attending: Family Medicine | Admitting: Physical Therapy

## 2024-01-09 DIAGNOSIS — G8929 Other chronic pain: Secondary | ICD-10-CM | POA: Diagnosis not present

## 2024-01-09 DIAGNOSIS — M25612 Stiffness of left shoulder, not elsewhere classified: Secondary | ICD-10-CM | POA: Insufficient documentation

## 2024-01-09 DIAGNOSIS — M62838 Other muscle spasm: Secondary | ICD-10-CM | POA: Diagnosis not present

## 2024-01-09 DIAGNOSIS — M25512 Pain in left shoulder: Secondary | ICD-10-CM | POA: Insufficient documentation

## 2024-01-09 NOTE — Therapy (Signed)
 OUTPATIENT PHYSICAL THERAPY SHOULDER TREATMENT   Patient Name: Lori Soto MRN: [id number] DOB:1936-02-11, 88 y.o., female Today's Date: 01/09/2024  END OF SESSION:  PT End of Session - 01/09/24 1652     Visit Number 5    Number of Visits 12    Date for PT Re-Evaluation 01/10/24    PT Start Time 0326    PT Stop Time 0415    PT Time Calculation (min) 49 min    Activity Tolerance Patient tolerated treatment well    Behavior During Therapy WFL for tasks assessed/performed          Past Medical History:  Diagnosis Date   Anemia    Arthritis    Cancer (HCC)    skin cancer on nose   Carpal tunnel syndrome, bilateral    Cataract    Esophagitis    GERD (gastroesophageal reflux disease)    H/O hiatal hernia    Osteopenia    Scoliosis    Shingles    SVT (supraventricular tachycardia) (HCC)    Ulcerative colitis    Past Surgical History:  Procedure Laterality Date   ABDOMINAL HYSTERECTOMY     BUNIONECTOMY     Bilateral   CARPAL TUNNEL RELEASE Left 03/10/2015   Procedure: LEFT CARPAL TUNNEL RELEASE;  Surgeon: Arley Curia, MD;  Location: [facility];  Service: Orthopedics;  Laterality: Left;   CARPAL TUNNEL RELEASE Right 10/31/2017   Procedure: RIGHT CARPAL TUNNEL RELEASE;  Surgeon: Curia Arley, MD;  Location: [facility];  Service: Orthopedics;  Laterality: Right;   CATARACT EXTRACTION W/PHACO  05/28/2012   Procedure: CATARACT EXTRACTION PHACO AND INTRAOCULAR LENS PLACEMENT (IOC);  Surgeon: Cherene Mania, MD;  Location: [facility];  Service: Ophthalmology;  Laterality: Right;  CDE=12.84   CATARACT EXTRACTION W/PHACO  06/07/2012   Procedure: CATARACT EXTRACTION PHACO AND INTRAOCULAR LENS PLACEMENT (IOC);  Surgeon: Cherene Mania, MD;  Location: [facility];  Service: Ophthalmology;  Laterality: Left;  CDE 17.60   COLONOSCOPY     COLONOSCOPY N/A 07/08/2015   Procedure: COLONOSCOPY;  Surgeon: Claudis RAYMOND Rivet, MD;  [facility];  Service: Endoscopy;   Laterality: N/A;  930   ESOPHAGEAL DILATION N/A 07/08/2015   Procedure: ESOPHAGEAL DILATION;  Surgeon: Claudis RAYMOND Rivet, MD;  [facility]: [facility];  Service: Endoscopy;  Laterality: N/A;   ESOPHAGOGASTRODUODENOSCOPY N/A 07/08/2015   Procedure: ESOPHAGOGASTRODUODENOSCOPY (EGD);  Surgeon: Claudis RAYMOND Rivet, MD;  [facility]: [facility];  Service: Endoscopy;  Laterality: N/A;   Hernia     Right inguinal   HERNIA REPAIR  1961   right inguinal hernia   UPPER GASTROINTESTINAL ENDOSCOPY     Patient Active Problem List   Diagnosis Date Noted   Hospital discharge follow-up 05/17/2023   Pulmonary embolism (HCC) 05/06/2023   Murmur 03/07/2023   History of supraventricular tachycardia 11/08/2021   Arthritis 06/13/2019   GERD (gastroesophageal reflux disease) 06/10/2019   SVT (supraventricular tachycardia) (HCC) 08/28/2018   Light headedness 05/09/2018   Carpal tunnel syndrome of right wrist 05/20/2015   Primary localized osteoarthrosis, hand 05/20/2015   Carpal tunnel syndrome on left 03/18/2015   Osteopenia of the elderly 02/19/2014   DDD (degenerative disc disease), lumbar 12/03/2013   Scoliosis 12/03/2013   Chronic low back pain 05/14/2013   IBS (irritable bowel syndrome) 11/08/2012   Hypertension 10/26/2011   Chronic ulcerative colitis (HCC) 03/23/2011   REFERRING PROVIDER: Norene Fielding DO.  REFERRING DIAG: Chronic left shoulder pain.  THERAPY DIAG:  Chronic left  shoulder pain  Stiffness of left shoulder, not elsewhere classified  Other muscle spasm  Rationale for Evaluation and Treatment: Rehabilitation  ONSET DATE: Ongoing.    SUBJECTIVE:                                                                                                                                                                                      SUBJECTIVE STATEMENT: Patient returns from an extended vacation with a right shoulder pain rating at 6/10.  PERTINENT HISTORY: Please see above.    PAIN:   Are you having pain? Yes: NPRS scale: 6/10 at rest today.   Pain location: Left shoulder. Pain description: Ache and throb.   Aggravating factors: Movement.   Relieving factors: Resting with arm by side.  PRECAUTIONS: None  RED FLAGS: None   WEIGHT BEARING RESTRICTIONS: No  FALLS:  Has patient fallen in last 6 months? No  LIVING ENVIRONMENT: Lives with: lives alone Lives in: House/apartment Has following equipment at home: None  OCCUPATION: Retired.    PLOF: Independent  PATIENT GOALS:Improve left shoulder motion and do more with less pain.    NEXT MD VISIT:   OBJECTIVE:   PATIENT SURVEYS:  Quick DASH:  79.54   POSTURE: Round shoulders.  UPPER EXTREMITY ROM:   Left shoulder active antigravity flexion to 105 degrees and ER is 28 degrees assessed in supine.  Significant crepitus noted.    UPPER EXTREMITY MMT:  Left shoulder flexion is 4- to 4/5, IR/ER strength is a solid 4/5 when tested with elbow by side.    SHOULDER SPECIAL TESTS: Positive impingement testing and negative Drop Arm test.    PALPATION:  Tender to palpation over left middle deltoid and posterior cuff musculature.                                                                                                                               TREATMENT DATE:   01/09/24:  Combo e'stim/US  at 1.50 W/CM2 x 7 minutes to patient's left middle deltoid f/b 16 minutes to left UT, posterior cuff and middle deltoid musculature f/b IFC at 80-150  Hz on 40% scan x 20 minutes. Normal modality response following removal of modality.  Normal modality response following removal of modality.   12/13/23:  In supine:  Gentle PROM x 10 minutes into left shoulder flexion, abd and ER f/b STW/M x 14 minutes to left UT, posterior cuff and middle deltoid musculature.     12/07/23:  UBE at 120 RPM's x 6 minutes f/b seated ranger x 4 minutes f/b supine cane exercises (bench press and flexion) 2 minutes each exercise f/b STW/M x  10 minutes f/b IFC at 80-150 Hz on 40% scan x 20 minutes. Normal modality response following removal of modality  12/04/23:  Combo e'stim/US  at 1.50 W/CM2 x 12 minutes f/b STW/M x 15 minutes to patient's left posterior cuff and middle deltoid musculature f/b IFC at 80-150 at 40% scan x 20 minutes. Normal modality response following removal of modality   11/29/23:  IFC at 80-150 Hz on 40% scan x 20 minutes to patient's left shoulder.  Normal modality response following removal of modality      PATIENT EDUCATION: Education details: See below. Person educated: Patient. Education method: Demo., tactile, handout. Education comprehension: Ship broker.  HOME EXERCISE PROGRAM: CANE CANE Created by [place] Jasher Barkan Aug 7th, 2025 View at [email or web address] code [id number] WAND FLEXION - SUPINE Lying on your back and holding a wand or cane, slowly raise the wand towards overhead. Use your unaffected arm to assist with the movement. Repeat 15 Times Hold 5 Seconds Complete 2 Sets Perform 4 Times a Day BENCH PRESS - UNWEIGHTED Start with both hands on the cane/rod with your elbows bent and the back of your upper arms resting on the mat. Straighten your elbows and press the cane/rod straight up toward the ceiling. Slowly return to the start position. **Place a small towel roll or pad under your elbow in the start position as needed to stay in a pain free range.** Repeat 10 Times Complete 3 Sets Perform 1 Time a Day  ASSESSMENT:  CLINICAL IMPRESSION: Patient has been out of PT since 8/13.  Her right shoulder pain was higher today.  She was notably tender over her left middle deltoid and posterior cuff musculature with a good response to soft tissue work today.  OBJECTIVE IMPAIRMENTS: decreased activity tolerance, decreased ROM, decreased strength, increased muscle spasms, and pain.   ACTIVITY LIMITATIONS: carrying and reach over head  PARTICIPATION LIMITATIONS: meal prep, cleaning, and  laundry  PERSONAL FACTORS: Time since onset of injury/illness/exacerbation are also affecting patient's functional outcome.   REHAB POTENTIAL: Fair plus/Good minus  CLINICAL DECISION MAKING: Stable/uncomplicated  EVALUATION COMPLEXITY: Low   GOALS:  SHORT TERM GOALS: Target date: 12/13/23  Ind with an initial HEP. Goal status: INITIAL    LONG TERM GOALS: Target date: 01/10/24  Ind with an advanced HEP.  Goal status: INITIAL  2.  Active left shoulder flexion to 125 degrees so she can reach into an overhead cabinet.  Goal status: INITIAL  3.  Active ER to 50-55 degrees to make donning/doffing apparel easier.   Baseline:  Goal status: INITIAL  4.  Improve Quck DASH score by at least 25%. Baseline:    PLAN:  PT FREQUENCY: 2x/week  PT DURATION: 6 weeks  PLANNED INTERVENTIONS: 97110-Therapeutic exercises, 97530- Therapeutic activity, [id number]- Neuromuscular re-education, 97535- Self Care, 02859- Manual therapy, [id number]- Electrical stimulation (unattended), 97016- Vasopneumatic device, 97035- Ultrasound, Patient/Family education, Cryotherapy, and Moist heat  PLAN FOR NEXT SESSION: Combo e'stim, STW/M, pulleys, UE Ranger, wall ladder.  Kyan Yurkovich, [place], PT 01/09/2024, 5:23 PM

## 2024-01-15 ENCOUNTER — Ambulatory Visit (INDEPENDENT_AMBULATORY_CARE_PROVIDER_SITE_OTHER): Admitting: Gastroenterology

## 2024-01-15 ENCOUNTER — Encounter (INDEPENDENT_AMBULATORY_CARE_PROVIDER_SITE_OTHER): Payer: Self-pay | Admitting: Gastroenterology

## 2024-01-15 VITALS — BP 138/80 | HR 66 | Temp 97.8°F | Ht 60.0 in | Wt 121.6 lb

## 2024-01-15 DIAGNOSIS — K219 Gastro-esophageal reflux disease without esophagitis: Secondary | ICD-10-CM | POA: Diagnosis not present

## 2024-01-15 DIAGNOSIS — Z8719 Personal history of other diseases of the digestive system: Secondary | ICD-10-CM | POA: Diagnosis not present

## 2024-01-15 DIAGNOSIS — K5100 Ulcerative (chronic) pancolitis without complications: Secondary | ICD-10-CM

## 2024-01-15 DIAGNOSIS — Z860100 Personal history of colon polyps, unspecified: Secondary | ICD-10-CM

## 2024-01-15 DIAGNOSIS — K2100 Gastro-esophageal reflux disease with esophagitis, without bleeding: Secondary | ICD-10-CM

## 2024-01-15 NOTE — Patient Instructions (Addendum)
 Continue mesalamine  400 mg daily Continue pantoprazole  40 mg daily

## 2024-01-15 NOTE — Progress Notes (Signed)
 Lori Soto, M.D. Gastroenterology & Hepatology [facility] [address] [place], [place] [address]  Primary Care Physician: Lori Soto HERO, DO [address] [place] [address]  I will communicate my assessment and recommendations to the referring MD via EMR.  Problems: Ulcerative pancolitis GERD 3.  History of erosive esophagitis   History of Present Illness: Lori Soto is a 88 y.o. female with past medical history of ulcerative pancolitis, PE on Eliquis , SVT, GERD, osteoporosis, who presents for follow up of ulcerative colitis and GERD.  The patient was last seen on 12/19/2022. At that time, the patient was advised to increase pantoprazole  to 20 mg daily.  Also advised to continue mesalamine  400 mg every day.  Patient would like to know if she would benefit from having more colonoscopies.  Patient reports that she has some leg/LLQ pain for the last few months. She is having regular bowel movements, possibly once a day. The patient denies having any nausea, vomiting, fever, chills, hematochezia, melena, hematemesis, abdominal distention, abdominal pain, diarrhea, jaundice, pruritus or weight loss.  No heartburn or dysphagia.  Taking PPI on a daily basis.  Most recent labs from 07/10/2023 showed normal BMP and CBC.  Last EGD: 2017  - LA Grade A esophagitis. - Medium-sized hiatal hernia. - Benign-appearing esophageal stenosis. Dilated with balloon up to 18 mm. - Normal stomach. - Normal examined duodenum. - Three biopsies were obtained at the gastroesophageal junction.   Last Colonoscopy: 2017 - One 5 mm polyp in the cecum, removed using lift and cut and a cold snare. Resected and retrieved. - One 7 mm polyp in the sigmoid colon, removed with a hot snare. Resected and retrieved. Clip (MR conditional) was placed. - Diverticulosis in the sigmoid colon.   Path 1. Esophagogastric junction, biopsy - ESOPHAGOGASTRIC  JUNCTION MUCOSA - NEGATIVE FOR INTESTINAL METAPLASIA - NEGATIVE FOR DYSPLASIA OR MALIGNANCY 2. Colon, polyp(s), cecal - TUBULAR ADENOMA(X1). - HIGH GRADE DYSPLASIA IS NOT IDENTIFIED. 3. Colon, polyp(s), sigmoid - ULCERATED INFLAMMED INTRAMUCOSAL LEIOMYOMA - NEGATIVE FOR DYSPLASIA OR MALIGNANCY   Last flu shot: is intolerant as she is allergic Last pneumonia shot:states she received 2 shot series in the past Last zoster vaccine: did not have it as she states she had shingles in the past. COVID-19 shot: advised to avoid it due to influenza vaccine allergy  Past Medical History: Past Medical History:  Diagnosis Date   Anemia    Arthritis    Cancer (HCC)    skin cancer on nose   Carpal tunnel syndrome, bilateral    Cataract    Esophagitis    GERD (gastroesophageal reflux disease)    H/O hiatal hernia    Osteopenia    Scoliosis    Shingles    SVT (supraventricular tachycardia) (HCC)    Ulcerative colitis     Past Surgical History: Past Surgical History:  Procedure Laterality Date   ABDOMINAL HYSTERECTOMY     BUNIONECTOMY     Bilateral   CARPAL TUNNEL RELEASE Left 03/10/2015   Procedure: LEFT CARPAL TUNNEL RELEASE;  Surgeon: Lori Curia, MD;  Location: [facility];  Service: Orthopedics;  Laterality: Left;   CARPAL TUNNEL RELEASE Right 10/31/2017   Procedure: RIGHT CARPAL TUNNEL RELEASE;  Surgeon: Soto Arley, MD;  Location: [facility];  Service: Orthopedics;  Laterality: Right;   CATARACT EXTRACTION W/PHACO  05/28/2012   Procedure: CATARACT EXTRACTION PHACO AND INTRAOCULAR LENS PLACEMENT (IOC);  Surgeon: Lori Mania, MD;  Location: [facility];  Service: Ophthalmology;  Laterality: Right;  CDE=12.84   CATARACT EXTRACTION W/PHACO  06/07/2012   Procedure: CATARACT EXTRACTION PHACO AND INTRAOCULAR LENS PLACEMENT (IOC);  Surgeon: Lori Mania, MD;  Location: [facility];  Service: Ophthalmology;  Laterality: Left;  CDE 17.60   COLONOSCOPY     COLONOSCOPY N/A 07/08/2015    Procedure: COLONOSCOPY;  Surgeon: Lori RAYMOND Rivet, MD;  [facility];  Service: Endoscopy;  Laterality: N/A;  930   ESOPHAGEAL DILATION N/A 07/08/2015   Procedure: ESOPHAGEAL DILATION;  Surgeon: Lori RAYMOND Rivet, MD;  [facility];  Service: Endoscopy;  Laterality: N/A;   ESOPHAGOGASTRODUODENOSCOPY N/A 07/08/2015   Procedure: ESOPHAGOGASTRODUODENOSCOPY (EGD);  Surgeon: Lori RAYMOND Rivet, MD;  [facility];  Service: Endoscopy;  Laterality: N/A;   Hernia     Right inguinal   HERNIA REPAIR  1961   right inguinal hernia   UPPER GASTROINTESTINAL ENDOSCOPY      Family History: Family History  Problem Relation Age of Onset   Arthritis Mother    Heart disease Mother    Cancer Father        pancreat   Pancreatic cancer Father    Arthritis Father        back issues    Cancer Brother        lung and liver   Liver cancer Brother    Lung cancer Brother    Heart disease Sister        Rheumatic Fever   Peptic Ulcer Sister    Diabetes Sister    Colon cancer Sister    Edema Sister    GI problems Sister        diverticulitis   Diabetes Sister    Neuropathy Sister    COPD Sister    Hiatal hernia Sister    GI problems Sister     Social History: Social History   Tobacco Use  Smoking Status Never  Smokeless Tobacco Never  Tobacco Comments   Never smoker   Social History   Substance and Sexual Activity  Alcohol Use Yes   Alcohol/week: 0.0 standard drinks of alcohol   Comment: Very Rarely   Social History   Substance and Sexual Activity  Drug Use No    Allergies: Allergies  Allergen Reactions   Penicillins Itching, Swelling and Rash   Covid-19 (Mrna) Vaccine Rash    Told by PCP to never take again.   Influenza Virus Vaccine Rash    Told by PCP to never take again   Tape Itching    Medications: Current Outpatient Medications  Medication Sig Dispense Refill   Acetaminophen  (TYLENOL  PO) Take 1 tablet by mouth daily as needed (pain,  headache).     ALPHA LIPOIC ACID PO Take 1 capsule by mouth daily.     apixaban  (ELIQUIS ) 2.5 MG TABS tablet Take 1 tablet (2.5 mg total) by mouth 2 (two) times daily. 60 tablet 6   Cyanocobalamin  (VITAMIN B 12 PO) Take 1 tablet by mouth daily.     ferrous sulfate  325 (65 FE) MG tablet Take 1 tablet (325 mg total) by mouth daily with breakfast. 60 tablet 0   Mesalamine  (ASACOL ) 400 MG CPDR DR capsule TAKE 2 CAPSULES BY MOUTH DAILY (Patient taking differently: Take 400 mg by mouth daily.) 180 capsule 3   metoprolol  tartrate (LOPRESSOR ) 25 MG tablet Take 1 tablet (25 mg total) by mouth 2 (two) times daily. 90 tablet 3   Multiple Vitamins-Minerals (MULTIVITAMIN WOMEN  50+) TABS Take 1 tablet by mouth daily.     Multiple Vitamins-Minerals (ZINC PO) Take 1 tablet by mouth daily.     OVER THE COUNTER MEDICATION Take 1 capsule by mouth daily. OTC : CoQ10 + red yeast rice     pantoprazole  (PROTONIX ) 40 MG tablet Take 1 tablet (40 mg total) by mouth daily before breakfast. 60 tablet 0   Pyridoxine HCl (VITAMIN B-6 PO) Take 1 tablet by mouth daily.     Sodium Hyaluronate, oral, (HYALURONIC ACID PO) Take 1 capsule by mouth daily.     Nutritional Supplements (GRAPESEED EXTRACT PO) Take 1 capsule by mouth daily. (Patient not taking: Reported on 01/15/2024)     TURMERIC CURCUMIN PO Take 1 tablet by mouth 2 (two) times daily. (Patient not taking: Reported on 01/15/2024)     No current facility-administered medications for this visit.    Review of Systems: GENERAL: negative for malaise, night sweats HEENT: No changes in hearing or vision, no nose bleeds or other nasal problems. NECK: Negative for lumps, goiter, pain and significant neck swelling RESPIRATORY: Negative for cough, wheezing CARDIOVASCULAR: Negative for chest pain, leg swelling, palpitations, orthopnea GI: SEE HPI MUSCULOSKELETAL: Negative for joint pain or swelling, back pain, and muscle pain. SKIN: Negative for lesions, rash PSYCH: Negative  for sleep disturbance, mood disorder and recent psychosocial stressors. HEMATOLOGY Negative for prolonged bleeding, bruising easily, and swollen nodes. ENDOCRINE: Negative for cold or heat intolerance, polyuria, polydipsia and goiter. NEURO: negative for tremor, gait imbalance, syncope and seizures. The remainder of the review of systems is noncontributory.   Physical Exam: BP 138/80 (BP Location: Left Arm, Patient Position: Sitting, Cuff Size: Normal)   Pulse 66   Temp 97.8 F (36.6 C) (Temporal)   Ht 5' (1.524 m)   Wt 121 lb 9.6 oz (55.2 kg)   BMI 23.75 kg/m  GENERAL: The patient is AO x3, in no acute distress. HEENT: Head is normocephalic and atraumatic. EOMI are intact. Mouth is well hydrated and without lesions. NECK: Supple. No masses LUNGS: Clear to auscultation. No presence of rhonchi/wheezing/rales. Adequate chest expansion HEART: RRR, normal s1 and s2. ABDOMEN: Soft, nontender, no guarding, no peritoneal signs, and nondistended. BS +. No masses. EXTREMITIES: Without any cyanosis, clubbing, rash, lesions or edema. NEUROLOGIC: AOx3, no focal motor deficit. SKIN: no jaundice, no rashes  Imaging/Labs: as above  I personally reviewed and interpreted the available labs, imaging and endoscopic files.  Impression and Plan: Lori Soto is a 88 y.o. female with past medical history of ulcerative pancolitis, PE on Eliquis , SVT, GERD, osteoporosis, who presents for follow up of ulcerative colitis and GERD.  The patient has presented adequate control of her ulcerative colitis with low-dose mesalamine  as she is presenting with minimal symptoms at the moment.  It is very likely given her age, her inflammatory bowel disease has also remained quiescent.  As she is not presenting any symptoms, I do not consider she would benefit from undergoing colonoscopies to explore her disease.  We discussed in detail that even though patient will ulcerative colitis have an increased risk of  malignancy, given her age and comorbidities including PE on anticoagulation, undergoing a colonoscopy for preventative purposes may put her at risk of cardiopulmonary events.  Ideally, this should only be pursued if she is having symptoms, which she is not currently presenting.  Due to this, we will hold off on pursuing a colonoscopy at this point.  Her GERD appears to be well-controlled with pantoprazole  daily, which  she can continue taking on a regular basis.  - Continue mesalamine  400 mg daily - Continue pantoprazole  40 mg daily  All questions were answered.      Lori Fortune, MD Gastroenterology and Hepatology [facility]

## 2024-01-18 ENCOUNTER — Encounter: Payer: Self-pay | Admitting: *Deleted

## 2024-01-18 ENCOUNTER — Ambulatory Visit: Admitting: *Deleted

## 2024-01-18 DIAGNOSIS — M62838 Other muscle spasm: Secondary | ICD-10-CM | POA: Diagnosis not present

## 2024-01-18 DIAGNOSIS — M25612 Stiffness of left shoulder, not elsewhere classified: Secondary | ICD-10-CM | POA: Diagnosis not present

## 2024-01-18 DIAGNOSIS — G8929 Other chronic pain: Secondary | ICD-10-CM

## 2024-01-18 DIAGNOSIS — M25512 Pain in left shoulder: Secondary | ICD-10-CM | POA: Diagnosis not present

## 2024-01-18 NOTE — Therapy (Signed)
 OUTPATIENT PHYSICAL THERAPY SHOULDER TREATMENT   Patient Name: Lori Soto MRN: [id number] DOB:[birth year], 88 y.o., female Today's Date: 01/18/2024  END OF SESSION:  PT End of Session - 01/18/24 1350     Visit Number 6    Number of Visits 12    Date for Recertification  01/10/24    PT Start Time 1350    PT Stop Time 1439    PT Time Calculation (min) 49 min          Past Medical History:  Diagnosis Date   Anemia    Arthritis    Cancer (HCC)    skin cancer on nose   Carpal tunnel syndrome, bilateral    Cataract    Esophagitis    GERD (gastroesophageal reflux disease)    H/O hiatal hernia    Osteopenia    Scoliosis    Shingles    SVT (supraventricular tachycardia) (HCC)    Ulcerative colitis    Past Surgical History:  Procedure Laterality Date   ABDOMINAL HYSTERECTOMY     BUNIONECTOMY     Bilateral   CARPAL TUNNEL RELEASE Left 03/10/2015   Procedure: LEFT CARPAL TUNNEL RELEASE;  Surgeon: Arley Curia, MD;  Location: [facility];  Service: Orthopedics;  Laterality: Left;   CARPAL TUNNEL RELEASE Right 10/31/2017   Procedure: RIGHT CARPAL TUNNEL RELEASE;  Surgeon: Curia Arley, MD;  Location: [facility];  Service: Orthopedics;  Laterality: Right;   CATARACT EXTRACTION W/PHACO  05/28/2012   Procedure: CATARACT EXTRACTION PHACO AND INTRAOCULAR LENS PLACEMENT (IOC);  Surgeon: Cherene Mania, MD;  Location: [facility];  Service: Ophthalmology;  Laterality: Right;  CDE=12.84   CATARACT EXTRACTION W/PHACO  06/07/2012   Procedure: CATARACT EXTRACTION PHACO AND INTRAOCULAR LENS PLACEMENT (IOC);  Surgeon: Cherene Mania, MD;  Location: [facility];  Service: Ophthalmology;  Laterality: Left;  CDE 17.60   COLONOSCOPY     COLONOSCOPY N/A 07/08/2015   Procedure: COLONOSCOPY;  Surgeon: Claudis RAYMOND Rivet, MD;  [facility];  Service: Endoscopy;  Laterality: N/A;  930   ESOPHAGEAL DILATION N/A 07/08/2015   Procedure: ESOPHAGEAL DILATION;  Surgeon: Claudis RAYMOND Rivet, MD;   [facility];  Service: Endoscopy;  Laterality: N/A;   ESOPHAGOGASTRODUODENOSCOPY N/A 07/08/2015   Procedure: ESOPHAGOGASTRODUODENOSCOPY (EGD);  Surgeon: Claudis RAYMOND Rivet, MD;  [facility];  Service: Endoscopy;  Laterality: N/A;   Hernia     Right inguinal   HERNIA REPAIR  1961   right inguinal hernia   UPPER GASTROINTESTINAL ENDOSCOPY     Patient Active Problem List   Diagnosis Date Noted   Hospital discharge follow-up 05/17/2023   Pulmonary embolism (HCC) 05/06/2023   Murmur 03/07/2023   History of supraventricular tachycardia 11/08/2021   Arthritis 06/13/2019   GERD (gastroesophageal reflux disease) 06/10/2019   SVT (supraventricular tachycardia) (HCC) 08/28/2018   Light headedness 05/09/2018   Carpal tunnel syndrome of right wrist 05/20/2015   Primary localized osteoarthrosis, hand 05/20/2015   Carpal tunnel syndrome on left 03/18/2015   Osteopenia of the elderly 02/19/2014   DDD (degenerative disc disease), lumbar 12/03/2013   Scoliosis 12/03/2013   Chronic low back pain 05/14/2013   IBS (irritable bowel syndrome) 11/08/2012   Hypertension 10/26/2011   Chronic ulcerative colitis (HCC) 03/23/2011   REFERRING PROVIDER: Norene Fielding DO.  REFERRING DIAG: Chronic left shoulder pain.  THERAPY DIAG:  Chronic left shoulder pain  Stiffness of left shoulder, not elsewhere classified  Other muscle spasm  Rationale for Evaluation and  Treatment: Rehabilitation  ONSET DATE: Ongoing.    SUBJECTIVE:                                                                                                                                                                                      SUBJECTIVE STATEMENT: Patient reports LT shldr is doing about the same with cracking and popping PERTINENT HISTORY: Please see above.    PAIN:  Are you having pain? Yes: NPRS scale: 6/10 at rest today.   Pain location: Left shoulder. Pain description: Ache and throb.    Aggravating factors: Movement.   Relieving factors: Resting with arm by side.  PRECAUTIONS: None  RED FLAGS: None   WEIGHT BEARING RESTRICTIONS: No  FALLS:  Has patient fallen in last 6 months? No  LIVING ENVIRONMENT: Lives with: lives alone Lives in: House/apartment Has following equipment at home: None  OCCUPATION: Retired.    PLOF: Independent  PATIENT GOALS:Improve left shoulder motion and do more with less pain.    NEXT MD VISIT:   OBJECTIVE:   PATIENT SURVEYS:  Quick DASH:  79.54   POSTURE: Round shoulders.  UPPER EXTREMITY ROM:   Left shoulder active antigravity flexion to 105 degrees and ER is 28 degrees assessed in supine.  Significant crepitus noted.    UPPER EXTREMITY MMT:  Left shoulder flexion is 4- to 4/5, IR/ER strength is a solid 4/5 when tested with elbow by side.    SHOULDER SPECIAL TESTS: Positive impingement testing and negative Drop Arm test.    PALPATION:  Tender to palpation over left middle deltoid and posterior cuff musculature.                                                                                                                               TREATMENT DATE:   01/18/24:  Pulleys x 4 mins  Combo e'stim/US  at 1.50 W/CM2 x 10 minutes to patient's left middle deltoid   Manual STW to LT Utrap and deltoid as well as PROM for ER and elevation  x 12 mins  IFC at 80-150 Hz on 40% scan x 15 minutes.  Normal modality response following removal of modality.  Normal modality response following removal of modality.   12/13/23:  In supine:  Gentle PROM x 10 minutes into left shoulder flexion, abd and ER f/b STW/M x 14 minutes to left UT, posterior cuff and middle deltoid musculature.     12/07/23:  UBE at 120 RPM's x 6 minutes f/b seated ranger x 4 minutes f/b supine cane exercises (bench press and flexion) 2 minutes each exercise f/b STW/M x 10 minutes f/b IFC at 80-150 Hz on 40% scan x 20 minutes. Normal modality response following  removal of modality  12/04/23:  Combo e'stim/US  at 1.50 W/CM2 x 12 minutes f/b STW/M x 15 minutes to patient's left posterior cuff and middle deltoid musculature f/b IFC at 80-150 at 40% scan x 20 minutes. Normal modality response following removal of modality   11/29/23:  IFC at 80-150 Hz on 40% scan x 20 minutes to patient's left shoulder.  Normal modality response following removal of modality      PATIENT EDUCATION: Education details: See below. Person educated: Patient. Education method: Demo., tactile, handout. Education comprehension: Ship broker.  HOME EXERCISE PROGRAM: CANE CANE Created by [place] Applegate Aug 7th, 2025 View at [email or web address] code [id number] WAND FLEXION - SUPINE Lying on your back and holding a wand or cane, slowly raise the wand towards overhead. Use your unaffected arm to assist with the movement. Repeat 15 Times Hold 5 Seconds Complete 2 Sets Perform 4 Times a Day BENCH PRESS - UNWEIGHTED Start with both hands on the cane/rod with your elbows bent and the back of your upper arms resting on the mat. Straighten your elbows and press the cane/rod straight up toward the ceiling. Slowly return to the start position. **Place a small towel roll or pad under your elbow in the start position as needed to stay in a pain free range.** Repeat 10 Times Complete 3 Sets Perform 1 Time a Day  ASSESSMENT:  CLINICAL IMPRESSION: Patient arrived with LT shldr pain doing about the same with cracking/ popping and pain.She was able to perform pulleys today and did fair. US  and STW were also performed as well as Premod for pain. PROM performed for ER and flexion, but painful and crepitation noted.  PROM flexion to 105 degrees  OBJECTIVE IMPAIRMENTS: decreased activity tolerance, decreased ROM, decreased strength, increased muscle spasms, and pain.   ACTIVITY LIMITATIONS: carrying and reach over head  PARTICIPATION LIMITATIONS: meal prep, cleaning, and  laundry  PERSONAL FACTORS: Time since onset of injury/illness/exacerbation are also affecting patient's functional outcome.   REHAB POTENTIAL: Fair plus/Good minus  CLINICAL DECISION MAKING: Stable/uncomplicated  EVALUATION COMPLEXITY: Low   GOALS:  SHORT TERM GOALS: Target date: 12/13/23  Ind with an initial HEP. Goal status: MET    LONG TERM GOALS: Target date: 01/10/24  Ind with an advanced HEP.  Goal status: INITIAL  2.  Active left shoulder flexion to 125 degrees so she can reach into an overhead cabinet.  Goal status: INITIAL  3.  Active ER to 50-55 degrees to make donning/doffing apparel easier.   Baseline:  Goal status: INITIAL  4.  Improve Quck DASH score by at least 25%. Baseline:    PLAN:  PT FREQUENCY: 2x/week  PT DURATION: 6 weeks  PLANNED INTERVENTIONS: 97110-Therapeutic exercises, 97530- Therapeutic activity, [id number]- Neuromuscular re-education, 97535- Self Care, [id number]- Manual therapy, [id number]- Electrical stimulation (unattended), [id number]- Vasopneumatic device, 97035- Ultrasound, Patient/Family education, Cryotherapy, and Moist heat  PLAN FOR NEXT SESSION: Combo e'stim, STW/M, pulleys,  UE Ranger, wall ladder.     Udell Blasingame,CHRIS, PTA 01/18/2024, 3:32 PM

## 2024-01-22 ENCOUNTER — Ambulatory Visit: Admitting: Physical Therapy

## 2024-01-22 DIAGNOSIS — M25612 Stiffness of left shoulder, not elsewhere classified: Secondary | ICD-10-CM | POA: Diagnosis not present

## 2024-01-22 DIAGNOSIS — G8929 Other chronic pain: Secondary | ICD-10-CM

## 2024-01-22 DIAGNOSIS — M62838 Other muscle spasm: Secondary | ICD-10-CM

## 2024-01-22 DIAGNOSIS — M25512 Pain in left shoulder: Secondary | ICD-10-CM | POA: Diagnosis not present

## 2024-01-22 NOTE — Therapy (Signed)
 OUTPATIENT PHYSICAL THERAPY SHOULDER TREATMENT   Patient Name: Lori Soto MRN: [id number] DOB:1935/08/02, 88 y.o., female Today's Date: 01/22/2024  END OF SESSION:  PT End of Session - 01/22/24 1637     Visit Number 7    Number of Visits 12    Date for Recertification  01/10/24    PT Start Time 0321    PT Stop Time 0408    PT Time Calculation (min) 47 min    Activity Tolerance Patient tolerated treatment well    Behavior During Therapy WFL for tasks assessed/performed          Past Medical History:  Diagnosis Date   Anemia    Arthritis    Cancer (HCC)    skin cancer on nose   Carpal tunnel syndrome, bilateral    Cataract    Esophagitis    GERD (gastroesophageal reflux disease)    H/O hiatal hernia    Osteopenia    Scoliosis    Shingles    SVT (supraventricular tachycardia)    Ulcerative colitis    Past Surgical History:  Procedure Laterality Date   ABDOMINAL HYSTERECTOMY     BUNIONECTOMY     Bilateral   CARPAL TUNNEL RELEASE Left 03/10/2015   Procedure: LEFT CARPAL TUNNEL RELEASE;  Surgeon: Arley Curia, MD;  Location: [facility];  Service: Orthopedics;  Laterality: Left;   CARPAL TUNNEL RELEASE Right 10/31/2017   Procedure: RIGHT CARPAL TUNNEL RELEASE;  Surgeon: Curia Arley, MD;  Location: [facility];  Service: Orthopedics;  Laterality: Right;   CATARACT EXTRACTION W/PHACO  05/28/2012   Procedure: CATARACT EXTRACTION PHACO AND INTRAOCULAR LENS PLACEMENT (IOC);  Surgeon: Cherene Mania, MD;  Location: [facility];  Service: Ophthalmology;  Laterality: Right;  CDE=12.84   CATARACT EXTRACTION W/PHACO  06/07/2012   Procedure: CATARACT EXTRACTION PHACO AND INTRAOCULAR LENS PLACEMENT (IOC);  Surgeon: Cherene Mania, MD;  Location: [facility];  Service: Ophthalmology;  Laterality: Left;  CDE 17.60   COLONOSCOPY     COLONOSCOPY N/A 07/08/2015   Procedure: COLONOSCOPY;  Surgeon: Claudis RAYMOND Rivet, MD;  [facility]: [facility];  Service: Endoscopy;  Laterality:  N/A;  930   ESOPHAGEAL DILATION N/A 07/08/2015   Procedure: ESOPHAGEAL DILATION;  Surgeon: Claudis RAYMOND Rivet, MD;  [facility]: [facility];  Service: Endoscopy;  Laterality: N/A;   ESOPHAGOGASTRODUODENOSCOPY N/A 07/08/2015   Procedure: ESOPHAGOGASTRODUODENOSCOPY (EGD);  Surgeon: Claudis RAYMOND Rivet, MD;  [facility]: [facility];  Service: Endoscopy;  Laterality: N/A;   Hernia     Right inguinal   HERNIA REPAIR  1961   right inguinal hernia   UPPER GASTROINTESTINAL ENDOSCOPY     Patient Active Problem List   Diagnosis Date Noted   Hospital discharge follow-up 05/17/2023   Pulmonary embolism (HCC) 05/06/2023   Murmur 03/07/2023   History of supraventricular tachycardia 11/08/2021   Arthritis 06/13/2019   GERD (gastroesophageal reflux disease) 06/10/2019   SVT (supraventricular tachycardia) 08/28/2018   Light headedness 05/09/2018   Carpal tunnel syndrome of right wrist 05/20/2015   Primary localized osteoarthrosis, hand 05/20/2015   Carpal tunnel syndrome on left 03/18/2015   Osteopenia of the elderly 02/19/2014   DDD (degenerative disc disease), lumbar 12/03/2013   Scoliosis 12/03/2013   Chronic low back pain 05/14/2013   IBS (irritable bowel syndrome) 11/08/2012   Hypertension 10/26/2011   Chronic ulcerative colitis (HCC) 03/23/2011   REFERRING PROVIDER: Norene Fielding DO.  REFERRING DIAG: Chronic left shoulder pain.  THERAPY DIAG:  Chronic left shoulder pain  Stiffness of left shoulder, not elsewhere classified  Other muscle spasm  Rationale for Evaluation and Treatment: Rehabilitation  ONSET DATE: Ongoing.    SUBJECTIVE:                                                                                                                                                                                      SUBJECTIVE STATEMENT: Pain a 5 today.  PERTINENT HISTORY: Please see above.    PAIN:  Are you having pain? Yes: NPRS scale: 5/10 at rest today.   Pain location: Left  shoulder. Pain description: Ache and throb.   Aggravating factors: Movement.   Relieving factors: Resting with arm by side.  PRECAUTIONS: None  RED FLAGS: None   WEIGHT BEARING RESTRICTIONS: No  FALLS:  Has patient fallen in last 6 months? No  LIVING ENVIRONMENT: Lives with: lives alone Lives in: House/apartment Has following equipment at home: None  OCCUPATION: Retired.    PLOF: Independent  PATIENT GOALS:Improve left shoulder motion and do more with less pain.    NEXT MD VISIT:   OBJECTIVE:   PATIENT SURVEYS:  Quick DASH:  79.54   POSTURE: Round shoulders.  UPPER EXTREMITY ROM:   Left shoulder active antigravity flexion to 105 degrees and ER is 28 degrees assessed in supine.  Significant crepitus noted.    UPPER EXTREMITY MMT:  Left shoulder flexion is 4- to 4/5, IR/ER strength is a solid 4/5 when tested with elbow by side.    SHOULDER SPECIAL TESTS: Positive impingement testing and negative Drop Arm test.    PALPATION:  Tender to palpation over left middle deltoid and posterior cuff musculature.                                                                                                                               TREATMENT DATE:   01/22/24:  UBE x 6 minutes f/b US  at 1.50 W/CM2 x 7 minutes f/b STW/M x 13 minutes f/b HMP and IFC at 80-150 Hz on 40% scan x 20 minutes.  Normal modality response following removal of modality.  01/18/24:  Pulleys  x 4 mins  Combo e'stim/US  at 1.50 W/CM2 x 10 minutes to patient's left middle deltoid   Manual STW to LT Utrap and deltoid as well as PROM for ER and elevation  x 12 mins  IFC at 80-150 Hz on 40% scan x 15 minutes.  Normal modality response following removal of modality.  Normal modality response following removal of modality.   12/13/23:  In supine:  Gentle PROM x 10 minutes into left shoulder flexion, abd and ER f/b STW/M x 14 minutes to left UT, posterior cuff and middle deltoid musculature.       PATIENT EDUCATION: Education details: See below. Person educated: Patient. Education method: Demo., tactile, handout. Education comprehension: Ship broker.  HOME EXERCISE PROGRAM: CANE CANE Created by [place] Jurell Basista Aug 7th, 2025 View at [email or web address]-exercise-[email or web address] code [id number] WAND FLEXION - SUPINE Lying on your back and holding a wand or cane, slowly raise the wand towards overhead. Use your unaffected arm to assist with the movement. Repeat 15 Times Hold 5 Seconds Complete 2 Sets Perform 4 Times a Day BENCH PRESS - UNWEIGHTED Start with both hands on the cane/rod with your elbows bent and the back of your upper arms resting on the mat. Straighten your elbows and press the cane/rod straight up toward the ceiling. Slowly return to the start position. **Place a small towel roll or pad under your elbow in the start position as needed to stay in a pain free range.** Repeat 10 Times Complete 3 Sets Perform 1 Time a Day  ASSESSMENT:  CLINICAL IMPRESSION: Patient with continued pain and loss of left shoulder range of motion.  She is considering asking her doctor about an orthopedic evaluation.    OBJECTIVE IMPAIRMENTS: decreased activity tolerance, decreased ROM, decreased strength, increased muscle spasms, and pain.   ACTIVITY LIMITATIONS: carrying and reach over head  PARTICIPATION LIMITATIONS: meal prep, cleaning, and laundry  PERSONAL FACTORS: Time since onset of injury/illness/exacerbation are also affecting patient's functional outcome.   REHAB POTENTIAL: Fair plus/Good minus  CLINICAL DECISION MAKING: Stable/uncomplicated  EVALUATION COMPLEXITY: Low   GOALS:  SHORT TERM GOALS: Target date: 12/13/23  Ind with an initial HEP. Goal status: MET    LONG TERM GOALS: Target date: 01/10/24  Ind with an advanced HEP.  Goal status: INITIAL  2.  Active left shoulder flexion to 125 degrees so she can reach into an overhead cabinet.  Goal status: INITIAL  3.   Active ER to 50-55 degrees to make donning/doffing apparel easier.   Baseline:  Goal status: INITIAL  4.  Improve Quck DASH score by at least 25%. Baseline:    PLAN:  PT FREQUENCY: 2x/week  PT DURATION: 6 weeks  PLANNED INTERVENTIONS: 97110-Therapeutic exercises, 97530- Therapeutic activity, [id number]- Neuromuscular re-education, 97535- Self Care, 02859- Manual therapy, G0283- Electrical stimulation (unattended), 97016- Vasopneumatic device, 97035- Ultrasound, Patient/Family education, Cryotherapy, and Moist heat  PLAN FOR NEXT SESSION: Combo e'stim, STW/M, pulleys, UE Ranger, wall ladder.     Kiptyn Rafuse, [place], PT 01/22/2024, 4:43 PM

## 2024-01-24 ENCOUNTER — Encounter: Admitting: Physical Therapy

## 2024-02-13 ENCOUNTER — Ambulatory Visit: Attending: Family Medicine | Admitting: Physical Therapy

## 2024-02-13 DIAGNOSIS — G8929 Other chronic pain: Secondary | ICD-10-CM | POA: Insufficient documentation

## 2024-02-13 DIAGNOSIS — M25612 Stiffness of left shoulder, not elsewhere classified: Secondary | ICD-10-CM | POA: Diagnosis not present

## 2024-02-13 DIAGNOSIS — M25512 Pain in left shoulder: Secondary | ICD-10-CM | POA: Diagnosis not present

## 2024-02-13 NOTE — Therapy (Signed)
 OUTPATIENT PHYSICAL THERAPY SHOULDER TREATMENT   Patient Name: Lori Soto MRN: [id number] DOB:21-Jun-1935, 88 y.o., female Today's Date: 02/13/2024  END OF SESSION:  PT End of Session - 02/13/24 1224     Visit Number 8    Number of Visits 12    Date for Recertification  01/10/24    PT Start Time 1103    PT Stop Time 1157    PT Time Calculation (min) 54 min    Activity Tolerance Patient tolerated treatment well           Past Medical History:  Diagnosis Date   Anemia    Arthritis    Cancer (HCC)    skin cancer on nose   Carpal tunnel syndrome, bilateral    Cataract    Esophagitis    GERD (gastroesophageal reflux disease)    H/O hiatal hernia    Osteopenia    Scoliosis    Shingles    SVT (supraventricular tachycardia)    Ulcerative colitis    Past Surgical History:  Procedure Laterality Date   ABDOMINAL HYSTERECTOMY     BUNIONECTOMY     Bilateral   CARPAL TUNNEL RELEASE Left 03/10/2015   Procedure: LEFT CARPAL TUNNEL RELEASE;  Surgeon: Arley Curia, MD;  Location: [facility];  Service: Orthopedics;  Laterality: Left;   CARPAL TUNNEL RELEASE Right 10/31/2017   Procedure: RIGHT CARPAL TUNNEL RELEASE;  Surgeon: Curia Arley, MD;  Location: [facility];  Service: Orthopedics;  Laterality: Right;   CATARACT EXTRACTION W/PHACO  05/28/2012   Procedure: CATARACT EXTRACTION PHACO AND INTRAOCULAR LENS PLACEMENT (IOC);  Surgeon: Cherene Mania, MD;  Location: [facility];  Service: Ophthalmology;  Laterality: Right;  CDE=12.84   CATARACT EXTRACTION W/PHACO  06/07/2012   Procedure: CATARACT EXTRACTION PHACO AND INTRAOCULAR LENS PLACEMENT (IOC);  Surgeon: Cherene Mania, MD;  Location: [facility];  Service: Ophthalmology;  Laterality: Left;  CDE 17.60   COLONOSCOPY     COLONOSCOPY N/A 07/08/2015   Procedure: COLONOSCOPY;  Surgeon: Claudis RAYMOND Rivet, MD;  [facility];  Service: Endoscopy;  Laterality: N/A;  930   ESOPHAGEAL DILATION N/A 07/08/2015   Procedure:  ESOPHAGEAL DILATION;  Surgeon: Claudis RAYMOND Rivet, MD;  [facility];  Service: Endoscopy;  Laterality: N/A;   ESOPHAGOGASTRODUODENOSCOPY N/A 07/08/2015   Procedure: ESOPHAGOGASTRODUODENOSCOPY (EGD);  Surgeon: Claudis RAYMOND Rivet, MD;  [facility];  Service: Endoscopy;  Laterality: N/A;   Hernia     Right inguinal   HERNIA REPAIR  1961   right inguinal hernia   UPPER GASTROINTESTINAL ENDOSCOPY     Patient Active Problem List   Diagnosis Date Noted   Hospital discharge follow-up 05/17/2023   Pulmonary embolism (HCC) 05/06/2023   Murmur 03/07/2023   History of supraventricular tachycardia 11/08/2021   Arthritis 06/13/2019   GERD (gastroesophageal reflux disease) 06/10/2019   SVT (supraventricular tachycardia) 08/28/2018   Light headedness 05/09/2018   Carpal tunnel syndrome of right wrist 05/20/2015   Primary localized osteoarthrosis, hand 05/20/2015   Carpal tunnel syndrome on left 03/18/2015   Osteopenia of the elderly 02/19/2014   DDD (degenerative disc disease), lumbar 12/03/2013   Scoliosis 12/03/2013   Chronic low back pain 05/14/2013   IBS (irritable bowel syndrome) 11/08/2012   Hypertension 10/26/2011   Chronic ulcerative colitis (HCC) 03/23/2011   REFERRING PROVIDER: Norene Fielding DO.  REFERRING DIAG: Chronic left shoulder pain.  THERAPY DIAG:  Chronic left shoulder pain  Stiffness of left shoulder, not elsewhere classified  Rationale for Evaluation and Treatment: Rehabilitation  ONSET DATE: Ongoing.    SUBJECTIVE:                                                                                                                                                                                      SUBJECTIVE STATEMENT: Pain a 5 today.  PERTINENT HISTORY: Please see above.    PAIN:  Are you having pain? Yes: NPRS scale: 5/10 at rest today.   Pain location: Left shoulder. Pain description: Ache and throb.   Aggravating factors: Movement.    Relieving factors: Resting with arm by side.  PRECAUTIONS: None  RED FLAGS: None   WEIGHT BEARING RESTRICTIONS: No  FALLS:  Has patient fallen in last 6 months? No  LIVING ENVIRONMENT: Lives with: lives alone Lives in: House/apartment Has following equipment at home: None  OCCUPATION: Retired.    PLOF: Independent  PATIENT GOALS:Improve left shoulder motion and do more with less pain.    NEXT MD VISIT:   OBJECTIVE:   PATIENT SURVEYS:  Quick DASH:  79.54   POSTURE: Round shoulders.  UPPER EXTREMITY ROM:   Left shoulder active antigravity flexion to 105 degrees and ER is 28 degrees assessed in supine.  Significant crepitus noted.    UPPER EXTREMITY MMT:  Left shoulder flexion is 4- to 4/5, IR/ER strength is a solid 4/5 when tested with elbow by side.    SHOULDER SPECIAL TESTS: Positive impingement testing and negative Drop Arm test.    PALPATION:  Tender to palpation over left middle deltoid and posterior cuff musculature.                                                                                                                               TREATMENT DATE:   02/23/24:  Seated UE Ranger x 8 minutes f/b left shoulder AAROM in all direction x 15 minutes f/b HMP and IFC at 80-150 Hz on 40% scan.  Normal modality response following removal of modality.  01/22/24:  UBE x 6 minutes f/b US  at 1.50 W/CM2 x 7 minutes f/b STW/M x 13 minutes  f/b HMP and IFC at 80-150 Hz on 40% scan x 20 minutes.  Normal modality response following removal of modality.   PATIENT EDUCATION: Education details: See below. Person educated: Patient. Education method: Demo., tactile, handout. Education comprehension: Ship broker.  HOME EXERCISE PROGRAM: CANE CANE Created by [place] Sakai Heinle Aug 7th, 2025 View at [email or web address] code [id number] WAND FLEXION - SUPINE Lying on your back and holding a wand or cane, slowly raise the wand towards overhead. Use your unaffected arm  to assist with the movement. Repeat 15 Times Hold 5 Seconds Complete 2 Sets Perform 4 Times a Day BENCH PRESS - UNWEIGHTED Start with both hands on the cane/rod with your elbows bent and the back of your upper arms resting on the mat. Straighten your elbows and press the cane/rod straight up toward the ceiling. Slowly return to the start position. **Place a small towel roll or pad under your elbow in the start position as needed to stay in a pain free range.** Repeat 10 Times Complete 3 Sets Perform 1 Time a Day  ASSESSMENT:  CLINICAL IMPRESSION: See discharge summary.    OBJECTIVE IMPAIRMENTS: decreased activity tolerance, decreased ROM, decreased strength, increased muscle spasms, and pain.   ACTIVITY LIMITATIONS: carrying and reach over head  PARTICIPATION LIMITATIONS: meal prep, cleaning, and laundry  PERSONAL FACTORS: Time since onset of injury/illness/exacerbation are also affecting patient's functional outcome.   REHAB POTENTIAL: Fair plus/Good minus  CLINICAL DECISION MAKING: Stable/uncomplicated  EVALUATION COMPLEXITY: Low   GOALS:  SHORT TERM GOALS: Target date: 12/13/23  Ind with an initial HEP. Goal status: MET    LONG TERM GOALS: Target date: 01/10/24  Ind with an advanced HEP.  Goal status: NM  2.  Active left shoulder flexion to 125 degrees so she can reach into an overhead cabinet.  Goal status: 100 degrees (02/13/24).  3.  Active ER to 50-55 degrees to make donning/doffing apparel easier.   Baseline:  Goal status: Partially met (40 degrees).  4.  Improve Quck DASH score by at least 25%. Baseline: NM (75%).     PLAN:  PT FREQUENCY: 2x/week  PT DURATION: 6 weeks  PLANNED INTERVENTIONS: 97110-Therapeutic exercises, 97530- Therapeutic activity, [id number]- Neuromuscular re-education, 97535- Self Care, 02859- Manual therapy, G0283- Electrical stimulation (unattended), 97016- Vasopneumatic device, 97035- Ultrasound, Patient/Family education,  Cryotherapy, and Moist heat  PLAN FOR NEXT SESSION: Combo e'stim, STW/M, pulleys, UE Ranger, wall ladder.    PHYSICAL THERAPY DISCHARGE SUMMARY  Visits from Start of Care: 8.  Current functional level related to goals / functional outcomes: See above.     Remaining deficits: See goal section.  Patient reports temporary pain relief after a PT session.  Patient plans to talk to doctor about getting into for an ortho consult.     Education / Equipment: HEP.   Patient agrees to discharge. Patient goals were not met. Patient is being discharged due to lack of progress.    Kayelee Herbig, [place], PT 02/13/2024, 12:30 PM

## 2024-02-14 ENCOUNTER — Encounter (INDEPENDENT_AMBULATORY_CARE_PROVIDER_SITE_OTHER): Payer: Self-pay | Admitting: Gastroenterology

## 2024-02-17 ENCOUNTER — Other Ambulatory Visit: Payer: Self-pay | Admitting: Family Medicine

## 2024-02-17 DIAGNOSIS — I10 Essential (primary) hypertension: Secondary | ICD-10-CM

## 2024-02-17 DIAGNOSIS — I471 Supraventricular tachycardia, unspecified: Secondary | ICD-10-CM

## 2024-03-01 ENCOUNTER — Other Ambulatory Visit (INDEPENDENT_AMBULATORY_CARE_PROVIDER_SITE_OTHER): Payer: Self-pay | Admitting: Gastroenterology

## 2024-03-03 ENCOUNTER — Encounter (HOSPITAL_COMMUNITY): Payer: Self-pay | Admitting: Emergency Medicine

## 2024-03-03 ENCOUNTER — Emergency Department (HOSPITAL_COMMUNITY)
Admission: EM | Admit: 2024-03-03 | Discharge: 2024-03-03 | Disposition: A | Discharge status: Home / Self Care | Attending: Emergency Medicine | Admitting: Emergency Medicine

## 2024-03-03 ENCOUNTER — Emergency Department (HOSPITAL_COMMUNITY)

## 2024-03-03 DIAGNOSIS — Z7901 Long term (current) use of anticoagulants: Secondary | ICD-10-CM | POA: Diagnosis not present

## 2024-03-03 DIAGNOSIS — Z79899 Other long term (current) drug therapy: Secondary | ICD-10-CM | POA: Diagnosis not present

## 2024-03-03 DIAGNOSIS — R002 Palpitations: Secondary | ICD-10-CM | POA: Diagnosis present

## 2024-03-03 DIAGNOSIS — R918 Other nonspecific abnormal finding of lung field: Secondary | ICD-10-CM | POA: Diagnosis not present

## 2024-03-03 DIAGNOSIS — I471 Supraventricular tachycardia, unspecified: Secondary | ICD-10-CM | POA: Diagnosis not present

## 2024-03-03 DIAGNOSIS — R000 Tachycardia, unspecified: Secondary | ICD-10-CM | POA: Diagnosis not present

## 2024-03-03 DIAGNOSIS — K449 Diaphragmatic hernia without obstruction or gangrene: Secondary | ICD-10-CM | POA: Insufficient documentation

## 2024-03-03 LAB — BASIC METABOLIC PANEL WITH GFR
Anion gap: 12 (ref 5–15)
BUN: 11 mg/dL (ref 8–23)
CO2: 29 mmol/L (ref 22–32)
Calcium: 9.6 mg/dL (ref 8.9–10.3)
Chloride: 99 mmol/L (ref 98–111)
Creatinine, Ser: 1.09 mg/dL — ABNORMAL HIGH (ref 0.44–1.00)
GFR, Estimated: 49 mL/min — ABNORMAL LOW (ref >60)
Glucose, Bld: 148 mg/dL — ABNORMAL HIGH (ref 70–99)
Potassium: 4 mmol/L (ref 3.5–5.1)
Sodium: 139 mmol/L (ref 135–145)

## 2024-03-03 LAB — CBC
HCT: 44.6 % (ref 36.0–46.0)
Hemoglobin: 14.6 g/dL (ref 12.0–15.0)
MCH: 30 pg (ref 26.0–34.0)
MCHC: 32.7 g/dL (ref 30.0–36.0)
MCV: 91.6 fL (ref 80.0–100.0)
Platelets: 309 K/uL (ref 150–400)
RBC: 4.87 MIL/uL (ref 3.87–5.11)
RDW: 13.2 % (ref 11.5–15.5)
WBC: 9.7 K/uL (ref 4.0–10.5)
nRBC: 0 % (ref 0.0–0.2)

## 2024-03-03 LAB — MAGNESIUM: Magnesium: 2.2 mg/dL (ref 1.7–2.4)

## 2024-03-03 LAB — TROPONIN T, HIGH SENSITIVITY
Troponin T High Sensitivity: 27 ng/L — ABNORMAL HIGH (ref 0–19)
Troponin T High Sensitivity: 31 ng/L — ABNORMAL HIGH (ref 0–19)

## 2024-03-03 MED ORDER — ADENOSINE 6 MG/2ML IV SOLN
6.0000 mg | Freq: Once | INTRAVENOUS | Status: AC
Start: 2024-03-03 — End: 2024-03-03
  Administered 2024-03-03: 6 mg via INTRAVENOUS
  Filled 2024-03-03: qty 2

## 2024-03-03 NOTE — Discharge Instructions (Signed)
 Follow-up with with your cardiologist in the next couple weeks.  Return if any more problems

## 2024-03-03 NOTE — ED Triage Notes (Signed)
 Pt here from home with c/o  high heart rate pt has history of svt and took her daily meds , took an extra metoprolol  around 1030, no chest pain

## 2024-03-03 NOTE — ED Provider Notes (Signed)
 [place] [facility]MENT AT [facility] Provider Note   CSN: [id number] Arrival date & time: 03/03/24  1551     Patient presents with: Tachycardia   Lori Soto is a 88 y.o. female.  {Add pertinent medical, surgical, social history, OB history to YEP:67052} Patient complains of palpitations.  She has a history of SVT and has received adenosine  this year and that helped her SVT   Palpitations      Prior to Admission medications   Medication Sig Start Date End Date Taking? Authorizing Provider  ALPHA LIPOIC ACID PO Take 1 capsule by mouth daily.   Yes [provider]  apixaban  (ELIQUIS ) 2.5 MG TABS tablet Take 1 tablet (2.5 mg total) by mouth 2 (two) times daily. 08/09/23  Yes Lavona Agent, MD  Cyanocobalamin  (VITAMIN B 12 PO) Take 1 tablet by mouth daily.   Yes [provider]  Mesalamine  (ASACOL ) 400 MG CPDR DR capsule TAKE 2 CAPSULES BY MOUTH DAILY Patient taking differently: Take 400 mg by mouth daily. 12/20/23  Yes Eartha Angelia Sieving, MD  metoprolol  tartrate (LOPRESSOR ) 25 MG tablet TAKE 1 TABLET BY MOUTH TWICE  DAILY 02/19/24  Yes Jolinda Potter M, DO  Multiple Vitamins-Minerals (MULTIVITAMIN WOMEN 50+) TABS Take 1 tablet by mouth daily.   Yes [provider]  Multiple Vitamins-Minerals (ZINC PO) Take 1 tablet by mouth daily.   Yes [provider]  Nutritional Supplements (GRAPESEED EXTRACT PO) Take 1 capsule by mouth daily.   Yes [provider]  OVER THE COUNTER MEDICATION Take 1 capsule by mouth daily. OTC : CoQ10 + red yeast rice   Yes [provider]  pantoprazole  (PROTONIX ) 40 MG tablet Take 1 tablet (40 mg total) by mouth daily before breakfast. 05/10/23  Yes Adhikari, Amrit, MD  Pyridoxine HCl (VITAMIN B-6 PO) Take 1 tablet by mouth daily.   Yes [provider]  Sodium Hyaluronate, oral, (HYALURONIC ACID PO) Take 1 capsule by mouth daily.   Yes [provider]     Allergies: Penicillins, Covid-19 (mrna) vaccine, Influenza virus vaccine, and Tape    Review of Systems  Cardiovascular:  Positive for palpitations.    Updated Vital Signs BP 128/77   Pulse 71   Temp 97.8 F (36.6 C) (Oral)   Resp 18   SpO2 94%   Physical Exam  (all labs ordered are listed, but only abnormal results are displayed) Labs Reviewed  BASIC METABOLIC PANEL WITH GFR - Abnormal; Notable for the following components:      Result Value   Glucose, Bld 148 (*)    Creatinine, Ser 1.09 (*)    GFR, Estimated 49 (*)    All other components within normal limits  TROPONIN T, HIGH SENSITIVITY - Abnormal; Notable for the following components:   Troponin T High Sensitivity 27 (*)    All other components within normal limits  TROPONIN T, HIGH SENSITIVITY - Abnormal; Notable for the following components:   Troponin T High Sensitivity 31 (*)    All other components within normal limits  CBC  MAGNESIUM    EKG: None  Radiology: [facility] Chest Port 1 View Result Date: 03/03/2024 EXAM: 1 VIEW XRAY OF THE CHEST 03/03/2024 05:36:24 PM COMPARISON: Prior study dated 07/10/2023. CLINICAL HISTORY: 142249 Tachycardia [id number] Tachycardia FINDINGS: LUNGS AND PLEURA: Minimal left basilar subsegmental atelectasis or infiltrate is noted. No pulmonary edema. No pleural effusion. No pneumothorax. HEART AND MEDIASTINUM: Stable cardiomediastinal silhouette. BONES AND SOFT TISSUES: Large hiatal hernia is again  noted. No acute osseous abnormality. IMPRESSION: 1. Minimal left basilar subsegmental atelectasis or infiltrate. 2. Stable large hiatal hernia. Electronically signed by: Lynwood Seip MD 03/03/2024 05:46 PM EST RP Workstation: [id number]    {Document cardiac monitor, telemetry assessment procedure when appropriate:32947} Procedures   Medications Ordered in the ED  adenosine  (ADENOCARD ) 6 MG/2ML injection 6 mg (6 mg Intravenous Given 03/03/24 1620)   CRITICAL CARE Performed by: Fairy Sermon Total critical care time: 45 minutes Critical care time was exclusive of separately billable procedures and treating other patients. Critical care was necessary to treat or prevent imminent or life-threatening deterioration. Critical care was time spent personally by me on the following activities: development of treatment plan with patient and/or surrogate as well as nursing, discussions with consultants, evaluation of patient's response to treatment, examination of patient, obtaining history from patient or surrogate, ordering and performing treatments and interventions, ordering and review of laboratory studies, ordering and review of radiographic studies, pulse oximetry and re-evaluation of patient's condition.    {Click here for ABCD2, HEART and other calculators REFRESH Note before signing:1}                              Medical Decision Making Amount and/or Complexity of Data Reviewed Labs: ordered. Radiology: ordered.  Risk Prescription drug management.   SVT at a rate of 150.  Patient responded to 6 mg of adenosine .  She will be discharged home and follow-up with her cardiologist  {Document critical care time when appropriate  Document review of labs and clinical decision tools ie [id number], etc  Document your independent review of radiology images and any outside records  Document your discussion with family members, caretakers and with consultants  Document social determinants of health affecting pt's care  Document your decision making why or why not admission, treatments were needed:32947:::1}   Final diagnoses:  SVT (supraventricular tachycardia)    ED Discharge Orders     None

## 2024-03-04 ENCOUNTER — Telehealth: Payer: Self-pay | Admitting: Cardiology

## 2024-03-04 NOTE — Telephone Encounter (Signed)
 Patient was in the emergency room over the weekend for SVT and was advised to follow up with Dr. Lavona, she sees him in the [place] office.  He does not have anything until February.  Patient does not want to come to [facility] as she does not have anyone to bring her.  She is wondering if Dr. Lavona can fit her in anytime soon.

## 2024-03-04 NOTE — Telephone Encounter (Signed)
 Pt scheduled with E. Peck in [place] 11/6 at 10:55 AM.

## 2024-03-07 ENCOUNTER — Ambulatory Visit: Attending: Nurse Practitioner | Admitting: Nurse Practitioner

## 2024-03-07 ENCOUNTER — Encounter: Payer: Self-pay | Admitting: Nurse Practitioner

## 2024-03-07 VITALS — BP 144/75 | HR 52 | Ht 60.0 in | Wt 123.0 lb

## 2024-03-07 DIAGNOSIS — Z86711 Personal history of pulmonary embolism: Secondary | ICD-10-CM | POA: Insufficient documentation

## 2024-03-07 DIAGNOSIS — R7989 Other specified abnormal findings of blood chemistry: Secondary | ICD-10-CM | POA: Diagnosis not present

## 2024-03-07 DIAGNOSIS — I471 Supraventricular tachycardia, unspecified: Secondary | ICD-10-CM | POA: Insufficient documentation

## 2024-03-07 DIAGNOSIS — I10 Essential (primary) hypertension: Secondary | ICD-10-CM | POA: Insufficient documentation

## 2024-03-07 NOTE — Patient Instructions (Signed)
 Medication Instructions:  Your physician recommends that you continue on your current medications as directed. Please refer to the Current Medication list given to you today.  *If you need a refill on your cardiac medications before your next appointment, please call your pharmacy*  Lab Work: 2 Weeks: -BMET  If you have labs (blood work) drawn today and your tests are completely normal, you will receive your results only by: MyChart Message (if you have MyChart) OR A paper copy in the mail If you have any lab test that is abnormal or we need to change your treatment, we will call you to review the results.  Testing/Procedures: None  Follow-Up: At [facility], you and your health needs are our priority.  As part of our continuing mission to provide you with exceptional heart care, our providers are all part of one team.  This team includes your primary Cardiologist (physician) and Advanced Practice Providers or APPs (Physician Assistants and Nurse Practitioners) who all work together to provide you with the care you need, when you need it.  Your next appointment:   3 month(s)  Provider:   You may see Lynwood Schilling, MD or the following Advanced Practice Provider on your designated Care Team:   Almarie Crate, NP    We recommend signing up for the patient portal called MyChart.  Sign up information is provided on this After Visit Summary.  MyChart is used to connect with patients for Virtual Visits (Telemedicine).  Patients are able to view lab/test results, encounter notes, upcoming appointments, etc.  Non-urgent messages can be sent to your provider as well.   To learn more about what you can do with MyChart, go to [email or web address].   Other Instructions You have been referred to EP. They will call you with your first appointment.

## 2024-03-07 NOTE — Progress Notes (Unsigned)
 Cardiology Office Note   Date:  03/07/2024 ID:  Lori Soto, DOB 1935/10/29, MRN [id number] PCP: Jolinda Norene HERO, DO  [facility] Providers Cardiologist:  Lynwood Schilling, MD     History of Present Illness Lori Soto is a 88 y.o. female with a PMH of SVT, hx of PE, and HTN, who presents today for ED follow-up.   Past history of hospitalizations for SVT, has required tx with adenosine  in the past. Hx of past PE in January 2025, PE was found to be unprovoked.   Last seen by Dr. Schilling on 08/09/2023. Was overall doing well at the time from a cardiac standpoint, refused SVT ablation. Most recent ED visit on 03/03/2024 for SVT and presented with palpitations, took an extra beta blocker that did not help. EKG showed SVT, HR 150 bpm. Denied any CP or SHOB. Received adenosine , converted to NSR.   Today she is here for ED follow-up. She says this past episode of SVT, it felt like bugs crawling on her skin, no overt palpitations, HR was in 150's and when she noticed this, she took an extra beta blocker, but did not help relieve her SVT, and then went to the ED. Wants to know why she keeps having these episodes. Denies any recent chest pain, shortness of breath, palpitations, syncope, presyncope, dizziness, orthopnea, PND, swelling or significant weight changes, acute bleeding, or claudication.  ROS: Negative. See HPI.   Studies Reviewed  EKG: EKG is not ordered today.   Echo 05/2023:   1. Left ventricular ejection fraction, by estimation, is 60 to 65%. The  left ventricle has normal function. The left ventricle has no regional  wall motion abnormalities. Left ventricular diastolic parameters are  indeterminate.   2. Right ventricular systolic function is low normal. The right  ventricular size is moderately enlarged. There is mildly elevated  pulmonary artery systolic pressure.   3. A small pericardial effusion is present. The pericardial effusion is  anterior to the  right ventricle. There is no evidence of cardiac  tamponade.   4. The mitral valve is normal in structure. No evidence of mitral valve  regurgitation. No evidence of mitral stenosis.   5. The tricuspid valve is abnormal.   6. The aortic valve is tricuspid. Aortic valve regurgitation is not  visualized. No aortic stenosis is present.   7. The inferior vena cava is normal in size with greater than 50%  respiratory variability, suggesting right atrial pressure of 3 mmHg.  Risk Assessment/Calculations  HYPERTENSION CONTROL Vitals:   03/07/24 1053 03/07/24 1115  BP: (!) 144/78 (!) 144/75    The patient's blood pressure is elevated above target today.  In order to address the patient's elevated BP: Blood pressure will be monitored at home to determine if medication changes need to be made.; Follow up with general cardiology has been recommended.          Physical Exam VS:  BP (!) 144/75   Pulse (!) 52   Ht 5' (1.524 m)   Wt 123 lb (55.8 kg)   SpO2 96%   BMI 24.02 kg/m        Wt Readings from Last 3 Encounters:  03/07/24 123 lb (55.8 kg)  01/15/24 121 lb 9.6 oz (55.2 kg)  11/16/23 121 lb 4.1 oz (55 kg)    GEN: Well nourished, well developed in no acute distress NECK: No JVD; No carotid bruits CARDIAC: S1/S2, slow rate and regular rhythm, no murmurs, rubs, gallops RESPIRATORY:  Clear to auscultation without rales, wheezing or rhonchi  ABDOMEN: Soft, non-tender, non-distended EXTREMITIES:  No edema; No deformity   ASSESSMENT AND PLAN  SVT Continues to have episodes of SVT, has required hospitalizations and receiving adenosine . Had in depth discussion regarding management and treatment of SVT. Agree with Dr. Lavona that she would be a good candidate for ablation, pt is agreeable to EP referral - will arrange. No current room to titrate her beta blocker as her HR today is 52 bpm. Care and ED precautions discussed.   Hx of PE Continue current dose of Eliquis , she is on  appropriate dosage and denies any bleeding issues.   HTN   BP is elevated today, her home readings were reviewed today that she brought in from home which shows she has some component of [facility]. Discussed to monitor BP at home at least 2 hours after medications and sitting for 5-10 minutes. Discussed when to notify our office. No medication changes at this time. Heart healthy diet and regular cardiovascular exercise encouraged.   4. Elevated serum creatinine Most recent labs showed sCr of 1.09 with eGFR of 49, most likely d/t dehydration. Will recheck BMET and discussed to stay well hydrated. Continue to follow with PCP.    Dispo: Follow-up with MD/APP in 3 months or sooner if anything changes.   Signed, Almarie Crate, NP

## 2024-03-08 ENCOUNTER — Encounter: Payer: Self-pay | Admitting: Cardiovascular Disease

## 2024-03-08 ENCOUNTER — Ambulatory Visit: Attending: Cardiovascular Disease | Admitting: Cardiovascular Disease

## 2024-03-08 VITALS — BP 146/82 | HR 78 | Wt 123.0 lb

## 2024-03-08 DIAGNOSIS — I471 Supraventricular tachycardia, unspecified: Secondary | ICD-10-CM | POA: Diagnosis not present

## 2024-03-08 NOTE — Progress Notes (Signed)
  Electrophysiology Office Note:    Date:  03/08/2024   ID:  Lori Soto, DOB October 28, 1935, MRN [id number]  PCP:  Jolinda Norene HERO, DO   [facility] Providers Cardiologist:  Lynwood Schilling, MD Electrophysiologist:  Eulas FORBES Furbish, MD     Referring MD: Miriam Norris, NP   History of Present Illness:    Lori Soto is a 88 y.o. 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      History of Present Illness She has had recurrence of SVT with multiple ER visits and prior administration of adenosine .  The most recent episode was March 03, 2024 (5 days ago).  Heart rate during the episode was about 150 bpm.  EKG is available in epic.  EKG shows a narrow complex tachycardia with a short RP interval.         Today, she reports that she feels well.   EKGs/Labs/Other Studies Reviewed Today:     Echocardiogram:  TTE 10/20/2023 LVEF 60 to 65%.  Small pericardial effusion present.  Mild tricuspid regurgitation.     EKG:   EKG Interpretation Date/Time:  Friday March 08 2024 13:36:15 EST Ventricular Rate:  78 PR Interval:  120 QRS Duration:  68 QT Interval:  358 QTC Calculation: 408 R Axis:   84  Text Interpretation: Normal sinus rhythm Cannot rule out Anterior infarct , age undetermined When compared with ECG of 03-Mar-2024 16:29, PACs are no longer present Confirmed by Furbish Eulas [phone]) on 03/08/2024 2:05:06 PM     Physical Exam:    VS:  BP (!) 146/82 (BP Location: Right Arm, Cuff Size: Normal)   Pulse 78   Wt 123 lb (55.8 kg)   SpO2 98%   BMI 24.02 kg/m     Wt Readings from Last 3 Encounters:  03/08/24 123 lb (55.8 kg)  03/07/24 123 lb (55.8 kg)  01/15/24 121 lb 9.6 oz (55.2 kg)     GEN: Well nourished, well developed in no acute distress CARDIAC: RRR, no murmurs, rubs, gallops RESPIRATORY:  Normal work of breathing     ASSESSMENT & PLAN:     SVT Short  RP tachycardia documented on EKG November 2.  Most consistent with AVNRT Plan for EP study and likely ablation Hold Eliquis  the day prior to the procedure and metoprolol  5 days prior to the procedure  We discussed the indication, rationale, logistics, anticipated benefits, and potential risks of the ablation procedure including but not limited to -- bleed at the groin access site, chest pain, damage to nearby organs such as the diaphragm, lungs, or esophagus, need for a drainage tube, pacemaker, or prolonged hospitalization. I explained that the risk for stroke, heart attack, need for open chest surgery, or even death is very low but not zero. she  expressed understanding and wishes to proceed.   History of PE Continue Eliquis  2.5 mg twice daily     Signed, Eulas FORBES Furbish, MD  03/08/2024 5:01 PM    [facility]

## 2024-03-08 NOTE — Patient Instructions (Addendum)
 Medication Instructions:  Your physician recommends that you continue on your current medications as directed. Please refer to the Current Medication list given to you today.  Labwork: You will be contacted about getting your lab work before your procedures  Testing/Procedures: Your physician has recommended that you have an EP Study. This test is used to assess serious arrhythmias (irregular heartbeats). During an Electro-physiology Study (EPS), a thin, flexible wire is passed through a vein in your groin (upper thigh) or neck up to the heart. The wire records the heart's electrical signals. Your doctor uses the wire to electrically stimulate your heart and trigger an arrhythmic. This allows the doctor to see whether an antiarrhythmia medicine can help manage the problem or if further procedures are necessary (i.e., ablation/ICD). Radiofrequency ablation, a procedure used to fix some types of arrthythmia, may be done during an EPS. This is done in the hospital and often requires an overnight stay. Please see the instruction sheet given to your today for more information. Your physician has recommended that you have an SVT ablation. Catheter ablation is a medical procedure used to treat some cardiac arrhythmias (irregular heartbeats). During catheter ablation, a long, thin, flexible tube is put into a blood vessel in your groin (upper thigh), or neck. This tube is called an ablation catheter. It is then guided to your heart through the blood vessel. Radio frequency waves destroy small areas of heart tissue where abnormal heartbeats may cause an arrhythmia to start. Please see the instruction sheet given to you today.  Follow-Up: Your physician recommends that you schedule a follow-up appointment in: pending  Any Other Special Instructions Will Be Listed Below (If Applicable).  If you need a refill on your cardiac medications before your next appointment, please call your pharmacy.

## 2024-03-15 ENCOUNTER — Telehealth: Payer: Self-pay | Admitting: Cardiovascular Disease

## 2024-03-15 NOTE — Telephone Encounter (Signed)
 Pt requesting cal back regarding SVT episode that she had this morning. Pt would like to know if she needs to be seen sooner than Feb appt. Please advise.

## 2024-03-15 NOTE — Telephone Encounter (Signed)
 Reports having an episode of SVT this morning as she was getting out of bed. Reports she took her metoprolol  25 mg, and then went to used the bathroom. Reports 15-20 minutes after laying back down, she took an additional 25 mg metoprolol  due to still being in SVT. Reports her symptoms improved some but she was still in quite a bit of distress from SVT and was driven to [facility] ED by her sister. Reports SVT resolved on its own after getting to the ED and she did not register to be seen. Reports she feels fine at this time but is asking if there is anything else she can do while awaiting EP study and SVT ablation appointments. ER precautions explained and advised that message would be sent to provider. Verbalized understanding.

## 2024-03-18 ENCOUNTER — Ambulatory Visit: Payer: Self-pay | Admitting: Family Medicine

## 2024-03-18 ENCOUNTER — Encounter: Payer: Self-pay | Admitting: Family Medicine

## 2024-03-18 VITALS — BP 142/78 | HR 63 | Temp 97.6°F | Ht 60.0 in | Wt 121.5 lb

## 2024-03-18 DIAGNOSIS — Z7901 Long term (current) use of anticoagulants: Secondary | ICD-10-CM

## 2024-03-18 DIAGNOSIS — G8929 Other chronic pain: Secondary | ICD-10-CM | POA: Diagnosis not present

## 2024-03-18 DIAGNOSIS — M25562 Pain in left knee: Secondary | ICD-10-CM | POA: Diagnosis not present

## 2024-03-18 DIAGNOSIS — M25512 Pain in left shoulder: Secondary | ICD-10-CM | POA: Diagnosis not present

## 2024-03-18 DIAGNOSIS — I471 Supraventricular tachycardia, unspecified: Secondary | ICD-10-CM | POA: Diagnosis not present

## 2024-03-18 NOTE — Progress Notes (Signed)
 Subjective: CC: Follow-up SVT, anticoagulation, scoliosis PCP: Lori Lori HERO, DO YEP:Lori Soto is a 88 y.o. female presenting to clinic today for:  Patient has seen her cardiologist since her last visit.  She was noted to have persistent SVT and there were plans for ablation.  Next cardiology visit is not scheduled until February but they anticipate procedure prior to that visit.  Wanting to know what they should do when they take her off of her beta-blocker for couple of days if she goes into SVT  She has also seen hematology who recommended that she continue lifelong anticoagulation.  She is accompanied today by a family member who has brought her today's visit.  She notes that the patient has also been having some left-sided shoulder and knee pain.  And in fact she had quite a bit of swelling in that left knee, particularly posteriorly.  She thinks may be arthritis but would like to have an orthopedist look at this because symptoms are not improving with home therapies.  She is unable to take NSAIDs secondary to chronic anticoagulation.  She is right-hand dominant.  Denies any sensory changes but does note pain in the left upper extremity when she tries to raise that above shoulder level.  No preceding injury.  Left knee was more swollen over the weekend but has gotten slightly better.  No reports of buckling or falls.  No injury.   ROS: Per HPI  Allergies  Allergen Reactions   Penicillins Itching, Swelling and Rash   Covid-19 (Mrna) Vaccine Rash    Told by PCP to never take again.   Influenza Virus Vaccine Rash    Told by PCP to never take again   Tape Itching   Past Medical History:  Diagnosis Date   Anemia    Arthritis    Cancer (HCC)    skin cancer on nose   Carpal tunnel syndrome, bilateral    Cataract    Esophagitis    GERD (gastroesophageal reflux disease)    H/O hiatal hernia    Osteopenia    Scoliosis    Shingles    SVT (supraventricular tachycardia)     Ulcerative colitis     Current Outpatient Medications:    ALPHA LIPOIC ACID PO, Take 1 capsule by mouth daily., Disp: , Rfl:    apixaban  (ELIQUIS ) 2.5 MG TABS tablet, Take 1 tablet (2.5 mg total) by mouth 2 (two) times daily., Disp: 60 tablet, Rfl: 6   Cyanocobalamin  (VITAMIN B 12 PO), Take 1 tablet by mouth daily., Disp: , Rfl:    Mesalamine  (ASACOL ) 400 MG CPDR DR capsule, TAKE 2 CAPSULES BY MOUTH DAILY (Patient taking differently: Take 400 mg by mouth daily.), Disp: 180 capsule, Rfl: 3   metoprolol  tartrate (LOPRESSOR ) 25 MG tablet, TAKE 1 TABLET BY MOUTH TWICE  DAILY, Disp: 180 tablet, Rfl: 0   Multiple Vitamins-Minerals (MULTIVITAMIN WOMEN 50+) TABS, Take 1 tablet by mouth daily., Disp: , Rfl:    Multiple Vitamins-Minerals (ZINC PO), Take 1 tablet by mouth daily., Disp: , Rfl:    Nutritional Supplements (GRAPESEED EXTRACT PO), Take 1 capsule by mouth daily., Disp: , Rfl:    OVER THE COUNTER MEDICATION, Take 1 capsule by mouth daily. OTC : CoQ10 + red yeast rice, Disp: , Rfl:    pantoprazole  (PROTONIX ) 20 MG tablet, TAKE 1 TABLET BY MOUTH DAILY  BEFORE BREAKFAST, Disp: 90 tablet, Rfl: 3   Pyridoxine HCl (VITAMIN B-6 PO), Take 1 tablet by mouth daily., Disp: ,  Rfl:    Sodium Hyaluronate, oral, (HYALURONIC ACID PO), Take 1 capsule by mouth daily., Disp: , Rfl:  Social History   Socioeconomic History   Marital status: Widowed    Spouse name: Not on file   Number of children: 3   Years of education: Not on file   Highest education level: Bachelor's degree (e.g., BA, AB, BS)  Occupational History    Employer: RETIRED    Comment: accounting  Tobacco Use   Smoking status: Never   Smokeless tobacco: Never   Tobacco comments:    Never smoker  Vaping Use   Vaping status: Never Used  Substance and Sexual Activity   Alcohol use: Yes    Alcohol/week: 0.0 standard drinks of alcohol    Comment: Very Rarely   Drug use: No   Sexual activity: Not Currently    Birth control/protection:  Surgical  Other Topics Concern   Not on file  Social History Narrative   Lives alone in 2 story home. Still very independent   Social Drivers of Corporate Investment Banker Strain: Low Risk  (06/20/2023)   Overall Financial Resource Strain (CARDIA)    Difficulty of Paying Living Expenses: Not hard at all  Food Insecurity: No Food Insecurity (08/03/2023)   Hunger Vital Sign    Worried About Running Out of Food in the Last Year: Never true    Ran Out of Food in the Last Year: Never true  Transportation Needs: No Transportation Needs (08/03/2023)   PRAPARE - Administrator, Civil Service (Medical): No    Lack of Transportation (Non-Medical): No  Physical Activity: Insufficiently Active (06/20/2023)   Exercise Vital Sign    Days of Exercise per Week: 3 days    Minutes of Exercise per Session: 30 min  Stress: No Stress Concern Present (06/20/2023)   [employer] of Occupational Health - Occupational Stress Questionnaire    Feeling of Stress : Not at all  Social Connections: Moderately Integrated (06/20/2023)   Social Connection and Isolation Panel    Frequency of Communication with Friends and Family: More than three times a week    Frequency of Social Gatherings with Friends and Family: Three times a week    Attends Religious Services: More than 4 times per year    Active Member of Clubs or Organizations: Yes    Attends Banker Meetings: More than 4 times per year    Marital Status: Widowed  Intimate Partner Violence: Not At Risk (08/03/2023)   Humiliation, Afraid, Rape, and Kick questionnaire    Fear of Current or Ex-Partner: No    Emotionally Abused: No    Physically Abused: No    Sexually Abused: No   Family History  Problem Relation Age of Onset   Arthritis Mother    Heart disease Mother    Cancer Father        pancreat   Pancreatic cancer Father    Arthritis Father        back issues    Cancer Brother        lung and liver   Liver cancer Brother     Lung cancer Brother    Heart disease Sister        Rheumatic Fever   Peptic Ulcer Sister    Diabetes Sister    Colon cancer Sister    Edema Sister    GI problems Sister        diverticulitis   Diabetes Sister  Neuropathy Sister    COPD Sister    Hiatal hernia Sister    GI problems Sister     Objective: Office vital signs reviewed. BP (!) 142/78   Pulse 63   Temp 97.6 F (36.4 C)   Ht 5' (1.524 m)   Wt 121 lb 8 oz (55.1 kg)   SpO2 99%   BMI 23.73 kg/m   Physical Examination:  General: Awake, alert, well nourished, No acute distress HEENT: sclera white, MMM Cardio: regular rate and rhythm, S1S2 heard, no murmurs appreciated Pulm: clear to auscultation bilaterally, no wheezes, rhonchi or rales; normal work of breathing on room air MSK: Hunched station with independent gait.  She has scoliotic curve of the spine with appreciable rib hump.  She has painful arc sign on the left upper extremity and is not really able to AB duct that left shoulder beyond 90 or 95 degrees without discomfort.  Left knee with osteoarthritic changes but no gross soft tissue swelling, warmth or erythema.  Assessment/ Plan: 88 y.o. female   SVT (supraventricular tachycardia)  Current use of long term anticoagulation  Chronic left shoulder pain - Plan: Ambulatory referral to Orthopedic Surgery  Chronic pain of left knee - Plan: Ambulatory referral to Orthopedic Surgery   I reviewed both cardiology and hematology recommendations.  She has not yet heard from the spinal scoliosis clinic, despite referral having been placed in April.  I gave her the information for this referral and encouraged her to contact me should she have any further issues getting an appointment.  I have also placed a referral to orthopedics for her for her left knee and shoulder pain.  I offered plain films but since these will likely be repeated at the orthopedic office she wished to defer these.  She is not a candidate for  NSAIDs but may be a candidate for injection therapy.   Lori CHRISTELLA Fielding, DO [facility] [phone]

## 2024-03-18 NOTE — Telephone Encounter (Signed)
 LM for pt to call back to schedule Ablation - [facility] had a cancellation on 11/25 that I will offer her.

## 2024-03-19 ENCOUNTER — Other Ambulatory Visit: Payer: Self-pay | Admitting: Cardiology

## 2024-03-19 NOTE — Telephone Encounter (Signed)
 Pt is scheduled for EP Study/SVT Ablation with Dr. Nancey on 05/14/24 at 11:30 am. She preferred to wait until after Christmas.   She will go to [facility] to have her labs done after the first of Jan.  I will send Instruction letter via MyChart and mail her a copy - address verified.

## 2024-03-19 NOTE — Telephone Encounter (Signed)
 Patient returned call

## 2024-03-19 NOTE — Telephone Encounter (Signed)
 Prescription refill request for Eliquis  received. Indication:palps Last office visit:11/25 Scr:1.09  11/25 Age: 88 Weight:55.1  kg  Prescription refilled

## 2024-03-25 DIAGNOSIS — H353112 Nonexudative age-related macular degeneration, right eye, intermediate dry stage: Secondary | ICD-10-CM | POA: Diagnosis not present

## 2024-04-01 ENCOUNTER — Telehealth: Payer: Self-pay | Admitting: Family Medicine

## 2024-04-01 NOTE — Telephone Encounter (Signed)
 Referral sent to: [facility] [address] [address] [phone]   MyChart Message sent to Patient with Specialty Office contact information.

## 2024-04-01 NOTE — Telephone Encounter (Signed)
 Copied from CRM #[id number]. Topic: Referral - Question >> Apr 01, 2024 10:34 AM Chiquita SQUIBB wrote: Reason for CRM: Patient is calling in for a update on her referral, patient would like a call to schedule as soon as it is approved. Please advise the patient.

## 2024-04-08 ENCOUNTER — Other Ambulatory Visit: Payer: Self-pay

## 2024-04-08 ENCOUNTER — Telehealth: Payer: Self-pay | Admitting: Cardiology

## 2024-04-08 DIAGNOSIS — I471 Supraventricular tachycardia, unspecified: Secondary | ICD-10-CM

## 2024-04-08 NOTE — Telephone Encounter (Signed)
 Pt will be having a procedure with Dr. Nancey and is hesitant about hold her metoprolol  for 5 days. Requesting c/b to discuss. Please advise.

## 2024-04-08 NOTE — Telephone Encounter (Signed)
 Reports concerns of worsening SVT symptoms when holding metoprolol  5 days prior to her ablation. Advised that per protocol, metoprolol  is held so that the ablation will be more effective and provider is able to map out the SVT area. Verbalized understanding.

## 2024-04-10 ENCOUNTER — Other Ambulatory Visit: Payer: Self-pay

## 2024-04-10 ENCOUNTER — Ambulatory Visit: Admitting: Surgical

## 2024-04-10 DIAGNOSIS — M25562 Pain in left knee: Secondary | ICD-10-CM | POA: Diagnosis not present

## 2024-04-10 DIAGNOSIS — G8929 Other chronic pain: Secondary | ICD-10-CM

## 2024-04-10 DIAGNOSIS — M25512 Pain in left shoulder: Secondary | ICD-10-CM

## 2024-04-11 NOTE — Telephone Encounter (Signed)
 Patient informed and verbalized understanding of plan.

## 2024-04-21 NOTE — Progress Notes (Signed)
 "  Office Visit Note   Patient: Lori Soto           Date of Birth: 07-08-1935           MRN: [id number] Visit Date: 04/10/2024 Requested by: Jolinda Norene HERO, DO [address] [place],  [place] [address] PCP: Jolinda Norene HERO, DO  Subjective: Chief Complaint  Patient presents with   Shoulder Pain    No injury started back in the spring. I can not raise my arm can't get stuff out of my cabinets. Was in therapy for my back and he tried a tens unit and that helped but he wanted me to get it checked out and some xrays before he done anymore   Knee Pain    Left- no injury has pain has tried some natural rub she [place] at the beach and gets relief from that    HPI: Lori Soto is a 88 y.o. female who presents to the office reporting left shoulder and left knee pain.  Patient states that the left shoulder pain started in the spring.  She has difficulty raising her arm.  She has associated grinding sensation with range of motion.  Has difficulty getting stuff out of her cabinet overhead.  Takes a TENS unit to use.  Regarding her left knee, she describes 1 year of knee pain.  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  She describes diffuse pain throughout the entirety of the left knee.  Still enjoys doing yard work.  Sometimes if she is very active she will have a increase in her swelling and a knot that she notices behind the knee.  Denies any history of surgery to her left knee or left shoulder.  No recent fall or injury.  No groin pain..                ROS: All systems reviewed are negative as they relate to the chief complaint within the history of present illness.  Patient denies fevers or chills.  Assessment & Plan: Visit Diagnoses:  1. Chronic left shoulder pain   2. Chronic pain of left knee     Plan: Impression is left knee and left shoulder arthritis.  We discussed options available to  patient such as trying injection versus physical therapy versus medications.  She states that she has upcoming SVT ablation and does not want to do anything stressful that may induce episode of SVT.  As result, we will plan to hold off on any injection today until several months after her SVT ablation.  Card provided to the patient and she will reach back out to us  when she is cleared by her cardiologist.  Follow-Up Instructions: No follow-ups on file.   Orders:  Orders Placed This Encounter  Procedures   DG Shoulder Left   DG Knee AP/LAT W/Sunrise Left   No orders of the defined types were placed in this encounter.     Procedures: No procedures performed   Clinical Data: No additional findings.  Objective: Vital Signs: There were no vitals taken for this visit.  Physical Exam:  Constitutional: Patient appears well-developed HEENT:  Head: Normocephalic Eyes:EOM are normal Neck: Normal range of motion Cardiovascular: Normal rate Pulmonary/chest: Effort normal Neurologic: Patient is alert Skin: Skin is warm Psychiatric: Patient has normal mood and affect  Ortho Exam: Ortho exam demonstrates left shoulder with  15 degrees X rotation, 50 degrees abduction, 110 degrees forward elevation passively and actively.  Intact rotator cuff strength of supra, infra, subscap without reproduction of pain.  She has actually nerve intact with deltoid firing.  Intact EPL, FPL, finger abduction of the left upper extremity.  Negative Lhermitte sign and negative Spurling sign.  She does have grinding and gear shifting sensation with passive motion of the left shoulder.  Left knee demonstrates 5 degrees extension and 110 degrees of knee flexion.  She has tenderness over the medial and lateral joint lines.  Palpable DP pulse of the left lower extremity.  Palpable radial pulse of the left upper extremity.  Able to perform straight leg raise.  Patellofemoral crepitus is noted.  No pain with hip range of  motion.  Stable to anterior and posterior drawer sign.  Specialty Comments:  No specialty comments available.  Imaging: No results found.   PMFS History: Patient Active Problem List   Diagnosis Date Noted   Current use of long term anticoagulation 03/18/2024   Hospital discharge follow-up 05/17/2023   Murmur 03/07/2023   History of supraventricular tachycardia 11/08/2021   Arthritis 06/13/2019   GERD (gastroesophageal reflux disease) 06/10/2019   SVT (supraventricular tachycardia) 08/28/2018   Light headedness 05/09/2018   Carpal tunnel syndrome of right wrist 05/20/2015   Primary localized osteoarthrosis, hand 05/20/2015   Carpal tunnel syndrome on left 03/18/2015   Osteopenia of the elderly 02/19/2014   DDD (degenerative disc disease), lumbar 12/03/2013   Scoliosis 12/03/2013   Chronic low back pain 05/14/2013   IBS (irritable bowel syndrome) 11/08/2012   Hypertension 10/26/2011   Chronic ulcerative colitis (HCC) 03/23/2011   Past Medical History:  Diagnosis Date   Anemia    Arthritis    Cancer (HCC)    skin cancer on nose   Carpal tunnel syndrome, bilateral    Cataract    Esophagitis    GERD (gastroesophageal reflux disease)    H/O hiatal hernia    Osteopenia    Scoliosis    Shingles    SVT (supraventricular tachycardia)    Ulcerative colitis     Family History  Problem Relation Age of Onset   Arthritis Mother    Heart disease Mother    Cancer Father        pancreat   Pancreatic cancer Father    Arthritis Father        back issues    Cancer Brother        lung and liver   Liver cancer Brother    Lung cancer Brother    Heart disease Sister        Rheumatic Fever   Peptic Ulcer Sister    Diabetes Sister    Colon cancer Sister    Edema Sister    GI problems Sister        diverticulitis   Diabetes Sister    Neuropathy Sister    COPD Sister    Hiatal hernia Sister    GI problems Sister     Past Surgical History:  Procedure Laterality Date    ABDOMINAL HYSTERECTOMY     BUNIONECTOMY     Bilateral   CARPAL TUNNEL RELEASE Left 03/10/2015   Procedure: LEFT CARPAL TUNNEL RELEASE;  Surgeon: Arley Curia, MD;  Location: [facility];  Service: Orthopedics;  Laterality: Left;   CARPAL TUNNEL RELEASE Right 10/31/2017   Procedure: RIGHT CARPAL TUNNEL RELEASE;  Surgeon: Curia Arley, MD;  Location: [facility];  Service: Orthopedics;  Laterality: Right;   CATARACT EXTRACTION W/PHACO  05/28/2012   Procedure: CATARACT EXTRACTION PHACO AND INTRAOCULAR LENS PLACEMENT (IOC);  Surgeon: Cherene Mania, MD;  Location: [facility];  Service: Ophthalmology;  Laterality: Right;  CDE=12.84   CATARACT EXTRACTION W/PHACO  06/07/2012   Procedure: CATARACT EXTRACTION PHACO AND INTRAOCULAR LENS PLACEMENT (IOC);  Surgeon: Cherene Mania, MD;  Location: [facility];  Service: Ophthalmology;  Laterality: Left;  CDE 17.60   COLONOSCOPY     COLONOSCOPY N/A 07/08/2015   Procedure: COLONOSCOPY;  Surgeon: Claudis RAYMOND Rivet, MD;  Location: [facility];  Service: Endoscopy;  Laterality: N/A;  930   ESOPHAGEAL DILATION N/A 07/08/2015   Procedure: ESOPHAGEAL DILATION;  Surgeon: Claudis RAYMOND Rivet, MD;  Location: [facility];  Service: Endoscopy;  Laterality: N/A;   ESOPHAGOGASTRODUODENOSCOPY N/A 07/08/2015   Procedure: ESOPHAGOGASTRODUODENOSCOPY (EGD);  Surgeon: Claudis RAYMOND Rivet, MD;  Location: [facility];  Service: Endoscopy;  Laterality: N/A;   Hernia     Right inguinal   HERNIA REPAIR  1961   right inguinal hernia   UPPER GASTROINTESTINAL ENDOSCOPY     Social History   Occupational History    Employer: RETIRED    Comment: accounting  Tobacco Use   Smoking status: Never   Smokeless tobacco: Never   Tobacco comments:    Never smoker  Vaping Use   Vaping status: Never Used  Substance and Sexual Activity   Alcohol use: Yes    Alcohol/week: 0.0 standard drinks of alcohol    Comment: Very Rarely   Drug use: No   Sexual activity: Not Currently    Birth  control/protection: Surgical        "

## 2024-04-22 ENCOUNTER — Emergency Department (HOSPITAL_COMMUNITY)
Admission: EM | Admit: 2024-04-22 | Discharge: 2024-04-22 | Disposition: A | Discharge status: Home / Self Care | Attending: Emergency Medicine | Admitting: Emergency Medicine

## 2024-04-22 ENCOUNTER — Other Ambulatory Visit: Payer: Self-pay

## 2024-04-22 ENCOUNTER — Encounter (HOSPITAL_COMMUNITY): Payer: Self-pay | Admitting: Emergency Medicine

## 2024-04-22 DIAGNOSIS — I471 Supraventricular tachycardia, unspecified: Secondary | ICD-10-CM | POA: Diagnosis not present

## 2024-04-22 DIAGNOSIS — Z7901 Long term (current) use of anticoagulants: Secondary | ICD-10-CM | POA: Insufficient documentation

## 2024-04-22 DIAGNOSIS — R7309 Other abnormal glucose: Secondary | ICD-10-CM | POA: Insufficient documentation

## 2024-04-22 DIAGNOSIS — R000 Tachycardia, unspecified: Secondary | ICD-10-CM | POA: Diagnosis present

## 2024-04-22 DIAGNOSIS — R739 Hyperglycemia, unspecified: Secondary | ICD-10-CM

## 2024-04-22 DIAGNOSIS — D649 Anemia, unspecified: Secondary | ICD-10-CM | POA: Diagnosis not present

## 2024-04-22 LAB — CBC WITH DIFFERENTIAL/PLATELET
Abs Immature Granulocytes: 0.02 K/uL (ref 0.00–0.07)
Basophils Absolute: 0 K/uL (ref 0.0–0.1)
Basophils Relative: 1 %
Eosinophils Absolute: 0.1 K/uL (ref 0.0–0.5)
Eosinophils Relative: 1 %
HCT: 36.3 % (ref 36.0–46.0)
Hemoglobin: 11.8 g/dL — ABNORMAL LOW (ref 12.0–15.0)
Immature Granulocytes: 0 %
Lymphocytes Relative: 27 %
Lymphs Abs: 1.6 K/uL (ref 0.7–4.0)
MCH: 29.6 pg (ref 26.0–34.0)
MCHC: 32.5 g/dL (ref 30.0–36.0)
MCV: 91 fL (ref 80.0–100.0)
Monocytes Absolute: 0.6 K/uL (ref 0.1–1.0)
Monocytes Relative: 10 %
Neutro Abs: 3.6 K/uL (ref 1.7–7.7)
Neutrophils Relative %: 61 %
Platelets: 233 K/uL (ref 150–400)
RBC: 3.99 MIL/uL (ref 3.87–5.11)
RDW: 13.5 % (ref 11.5–15.5)
WBC: 5.9 K/uL (ref 4.0–10.5)
nRBC: 0 % (ref 0.0–0.2)

## 2024-04-22 LAB — BASIC METABOLIC PANEL WITH GFR
Anion gap: 5 (ref 5–15)
BUN: 9 mg/dL (ref 8–23)
CO2: 33 mmol/L — ABNORMAL HIGH (ref 22–32)
Calcium: 9 mg/dL (ref 8.9–10.3)
Chloride: 104 mmol/L (ref 98–111)
Creatinine, Ser: 0.79 mg/dL (ref 0.44–1.00)
GFR, Estimated: >60 mL/min
Glucose, Bld: 101 mg/dL — ABNORMAL HIGH (ref 70–99)
Potassium: 3.9 mmol/L (ref 3.5–5.1)
Sodium: 141 mmol/L (ref 135–145)

## 2024-04-22 LAB — MAGNESIUM: Magnesium: 2 mg/dL (ref 1.7–2.4)

## 2024-04-22 NOTE — ED Provider Notes (Signed)
 " [facility] AT [facility] Provider Note   CSN: [id number] Arrival date & time: 04/22/24  9985     Patient presents with: SVT   Lori Soto is a 88 y.o. female.   The history is provided by the patient and the EMS personnel.   She has history of PSVT, GERD, ulcerative colitis was brought in by ambulance because of heart racing.  She denies chest pain, heaviness, tightness, pressure denies dizziness or lightheadedness.  She used her blood pressure monitor to check her heart rate and it was 165.  She took extra dose of metoprolol  without any benefit.  EMS noted and she was in PSVT and gave her adenosine  6 mg with successful conversion to sinus rhythm.  She feels like she is back to normal.    Prior to Admission medications  Medication Sig Start Date End Date Taking? Authorizing Provider  ALPHA LIPOIC ACID PO Take 1 capsule by mouth daily.    [provider]  Cyanocobalamin  (VITAMIN B 12 PO) Take 1 tablet by mouth daily.    [provider]  ELIQUIS  2.5 MG TABS tablet TAKE 1 TABLET BY MOUTH TWICE DAILY 03/19/24   Lynwood Schilling, MD  Mesalamine  (ASACOL ) 400 MG CPDR DR capsule TAKE 2 CAPSULES BY MOUTH DAILY Patient taking differently: Take 400 mg by mouth daily. 12/20/23   Eartha Angelia Sieving, MD  metoprolol  tartrate (LOPRESSOR ) 25 MG tablet TAKE 1 TABLET BY MOUTH TWICE  DAILY 02/19/24   Jolinda Potter M, DO  Multiple Vitamins-Minerals (MULTIVITAMIN WOMEN 50+) TABS Take 1 tablet by mouth daily.    [provider]  Multiple Vitamins-Minerals (ZINC PO) Take 1 tablet by mouth daily.    [provider]  Nutritional Supplements (GRAPESEED EXTRACT PO) Take 1 capsule by mouth daily.    [provider]  OVER THE COUNTER MEDICATION Take 1 capsule by mouth daily. OTC : CoQ10 + red yeast rice    [provider]  pantoprazole  (PROTONIX ) 20 MG tablet TAKE 1 TABLET BY MOUTH DAILY  BEFORE BREAKFAST  03/04/24   Eartha Angelia, Sieving, MD  Pyridoxine HCl (VITAMIN B-6 PO) Take 1 tablet by mouth daily.    [provider]  Sodium Hyaluronate, oral, (HYALURONIC ACID PO) Take 1 capsule by mouth daily.    [provider]    Allergies: Penicillins, Covid-19 (mrna) vaccine, Influenza virus vaccine, and Tape    Review of Systems  All other systems reviewed and are negative.   Updated Vital Signs BP 119/81   Pulse 67   Temp (!) 97.5 F (36.4 C) (Oral)   Resp 16   Ht 5' (1.524 m)   Wt 55.1 kg   SpO2 97%   BMI 23.72 kg/m   Physical Exam Vitals and nursing note reviewed.   88 year old female, resting comfortably and in no acute distress. Vital signs are normal. Oxygen saturation is 97%, which is normal. Head is normocephalic and atraumatic. PERRLA, EOMI.  Lungs are clear without rales, wheezes, or rhonchi. Heart has regular rate and rhythm without murmur. Extremities have no cyanosis or edema, full range of motion is present. Skin is warm and dry without rash. Neurologic: Awake and alert, moves all extremities equally.  (all labs ordered are listed, but only abnormal results are displayed) Labs Reviewed - No data to display  EKG: EKG Interpretation Date/Time:  Monday April 22 2024 00:27:08 EST Ventricular Rate:  53 PR Interval:  155 QRS Duration:  82 QT Interval:  418 QTC Calculation: 393 R Axis:   74  Text Interpretation: Sinus rhythm Atrial premature complexes Probable left atrial enlargement When compared with ECG of 03/08/224, Premature atrial complexes are now present HEART RATE has decreased Confirmed by Raford Lenis ([id number]) on 04/22/2024 12:33:10 AM  Radiology: No results found.  Cardiac monitor shows normal sinus rhythm with PACs, per my interpretation.  Procedures   Medications Ordered in the ED - No data to display                                  Medical Decision Making Amount and/or Complexity of Data Reviewed Labs:  ordered.   Episode of PSVT successfully treated by EMS with intravenous adenosine .  I have reviewed her past records, and note numerous ED visits for SVT, most recently on 03/03/2024.  I reviewed her electrocardiogram, my interpretation is sinus bradycardia with PACs but no ST or T changes.  I have ordered screening labs.  All reviewed along detail, no interpretation is borderline elevated random glucose level which will need to be followed as an outpatient, mild anemia with the need to be followed as an outpatient, normal magnesium.  She has had no changes in her heart rhythm in the ED.  I am discharging her with instructions to follow-up with her cardiologist.     Final diagnoses:  PSVT (paroxysmal supraventricular tachycardia)  Elevated random blood glucose level  Normochromic normocytic anemia  Anticoagulated on apixaban     ED Discharge Orders          Ordered    Ambulatory referral to Cardiology       Comments: If you have not heard from the Cardiology office within the next 72 hours please call [phone].   04/22/24 0109               Raford Lenis, MD 04/22/24 0110  "

## 2024-04-22 NOTE — ED Triage Notes (Signed)
 Pt BIB [facility] from home, pt called after she realized her HR had increased again. Hx of SVT. HR found to be 150's by EMS, pt given 500ml NS and 6mg  adenosine . PT arrives to ED in NSR rate of 65.

## 2024-04-23 ENCOUNTER — Telehealth (HOSPITAL_COMMUNITY): Payer: Self-pay

## 2024-04-23 ENCOUNTER — Encounter (HOSPITAL_COMMUNITY): Payer: Self-pay

## 2024-04-23 NOTE — Telephone Encounter (Signed)
 Noted patient was recently at [facility] for PSVT episode. Labs show Hgb 11.8, which has dropped from when last taken a month ago. Will forward labs to PCP. Do you recommend a repeat CBC day of procedure?   Attempted to reach patient to discuss upcoming procedure and assess for any s/o bleeding. No answer. Left VM for patient to return call.

## 2024-04-24 ENCOUNTER — Telehealth: Payer: Self-pay

## 2024-04-24 DIAGNOSIS — Z862 Personal history of diseases of the blood and blood-forming organs and certain disorders involving the immune mechanism: Secondary | ICD-10-CM

## 2024-04-24 NOTE — Telephone Encounter (Signed)
 Copied from CRM #[id number]. Topic: Clinical - Lab/Test Results >> Apr 24, 2024 10:16 AM Rosaria BRAVO wrote: Reason for CRM: Pt would like for her PCP to review her lab work from the hospital last weekend. She wants advice/recommendations on what to do regarding these results.   Best contact: [phone]

## 2024-04-29 ENCOUNTER — Telehealth: Payer: Self-pay | Admitting: Family Medicine

## 2024-04-29 NOTE — Telephone Encounter (Signed)
Returned patient call, left message to call back

## 2024-04-29 NOTE — Telephone Encounter (Signed)
 Copied from CRM #[id number]. Topic: Clinical - Lab/Test Results >> Apr 29, 2024  2:04 PM Diannia H wrote: Reason for CRM: Patient called for Suzen (CMA) to go over her labs. I went over them with her but she has some additional questions. She didn't want to make her lab appointment either until those questions where answered. Callback number is [phone].

## 2024-04-29 NOTE — Telephone Encounter (Addendum)
Left message for patient to call back to review lab results.  

## 2024-04-29 NOTE — Telephone Encounter (Signed)
 Slight elevation in sugar but not overly concerned about that.  I am concerned she has a drop in hemoglobin again and is anemic based on that lab.  I'd like to repeat CBC and add anemia panel please.  Please set her up with lab appt and order labs with diagnosis of anemia, unspecified.

## 2024-04-30 NOTE — Telephone Encounter (Unsigned)
 Copied from CRM #[id number]. Topic: Clinical - Lab/Test Results >> Apr 29, 2024  2:04 PM Diannia H wrote: Reason for CRM: Patient called for Suzen (CMA) to go over her labs. I went over them with her but she has some additional questions. She didn't want to make her lab appointment either until those questions where answered. Callback number is [phone]. >> Apr 30, 2024 10:14 AM Chiquita SQUIBB wrote: Patient is returning a call to Luke Luke was unavailable at the time. Please contact the patient back.

## 2024-05-01 ENCOUNTER — Telehealth: Payer: Self-pay | Admitting: Cardiovascular Disease

## 2024-05-01 ENCOUNTER — Ambulatory Visit: Payer: Self-pay

## 2024-05-01 NOTE — Telephone Encounter (Signed)
 Patient wants to know if she will still need to do labs prior to her procedure on 1/13.

## 2024-05-01 NOTE — Telephone Encounter (Signed)
 FYI Only or Action Required?: Action required by provider: clinical question for provider.  Patient was last seen in primary care on 03/18/2024 by Jolinda Norene HERO, DO.  Called Nurse Triage reporting Lab Results.  Triage Disposition: Call PCP Within 24 Hours  Patient/caregiver understands and will follow disposition?: Yes  Copied from CRM #[id number]. Topic: Clinical - Lab/Test Results >> May 01, 2024  1:01 PM Lori Soto wrote: Reason for CRM: pt never got a call for lab results, office is closed. Reason for Disposition  [1] Caller requests to speak ONLY to PCP AND [2] NON-URGENT question  Answer Assessment - Initial Assessment Questions 1. REASON FOR CALL or QUESTION: What is your reason for calling today? or How can I best     Patient calling to ask questions about her lab work from her ED visit. Explained her abnormal levels and answered questions. Patient is still concerned with her levels and would like to get her blood work redrawn. Patient is asking for a call back from the office.  2. CALLER: Document the source of call. (e.g., laboratory staff, caregiver or patient).     patient  Protocols used: PCP Call - No Triage-A-AH

## 2024-05-01 NOTE — Telephone Encounter (Signed)
 Call to patient to advise that per Dr. Nancey she does not need to repeat labs prior to her procedure on 1/13. Patient verbalizes understanding.

## 2024-05-03 ENCOUNTER — Other Ambulatory Visit

## 2024-05-03 DIAGNOSIS — Z862 Personal history of diseases of the blood and blood-forming organs and certain disorders involving the immune mechanism: Secondary | ICD-10-CM

## 2024-05-03 NOTE — Telephone Encounter (Signed)
Handled in new encounter.

## 2024-05-03 NOTE — Telephone Encounter (Signed)
 Patient aware of lab results, lab appointment scheduled, labs ordered.

## 2024-05-03 NOTE — Addendum Note (Signed)
 Addended by: SHERRE SUZEN PARAS on: 05/03/2024 11:00 AM   Modules accepted: Orders

## 2024-05-04 LAB — CBC
Hematocrit: 43.8 % (ref 34.0–46.6)
Hemoglobin: 13.9 g/dL (ref 11.1–15.9)
MCH: 29.4 pg (ref 26.6–33.0)
MCHC: 31.7 g/dL (ref 31.5–35.7)
MCV: 93 fL (ref 79–97)
Platelets: 231 x10E3/uL (ref 150–450)
RBC: 4.73 x10E6/uL (ref 3.77–5.28)
RDW: 12.9 % (ref 11.7–15.4)
WBC: 6.6 x10E3/uL (ref 3.4–10.8)

## 2024-05-04 LAB — BASIC METABOLIC PANEL WITH GFR
BUN/Creatinine Ratio: 11 — AB (ref 12–28)
BUN: 9 mg/dL (ref 8–27)
CO2: 27 mmol/L (ref 20–29)
Calcium: 10.1 mg/dL (ref 8.7–10.3)
Chloride: 99 mmol/L (ref 96–106)
Creatinine, Ser: 0.84 mg/dL (ref 0.57–1.00)
Glucose: 161 mg/dL — AB (ref 70–99)
Potassium: 4.7 mmol/L (ref 3.5–5.2)
Sodium: 142 mmol/L (ref 134–144)
eGFR: 68 mL/min/1.73

## 2024-05-06 ENCOUNTER — Ambulatory Visit: Payer: Self-pay | Admitting: Cardiovascular Disease

## 2024-05-06 LAB — ANEMIA PANEL
Ferritin: 174 ng/mL — AB (ref 15–150)
Folate, Hemolysate: >620 ng/mL
Folate, RBC: >1429 ng/mL
Hematocrit: 43.4 % (ref 34.0–46.6)
Iron Saturation: 18 % (ref 15–55)
Iron: 56 ug/dL (ref 27–139)
Retic Ct Pct: 1.2 % (ref 0.6–2.6)
Total Iron Binding Capacity: 313 ug/dL (ref 250–450)
UIBC: 257 ug/dL (ref 118–369)
Vitamin B-12: 1636 pg/mL — AB (ref 232–1245)

## 2024-05-06 LAB — CBC
Hemoglobin: 14.1 g/dL (ref 11.1–15.9)
MCH: 29.9 pg (ref 26.6–33.0)
MCHC: 32.5 g/dL (ref 31.5–35.7)
MCV: 92 fL (ref 79–97)
Platelets: 174 x10E3/uL (ref 150–450)
RBC: 4.71 x10E6/uL (ref 3.77–5.28)
RDW: 12.9 % (ref 11.7–15.4)
WBC: 6.2 x10E3/uL (ref 3.4–10.8)

## 2024-05-07 ENCOUNTER — Ambulatory Visit: Payer: Self-pay | Admitting: Family Medicine

## 2024-05-08 ENCOUNTER — Telehealth: Payer: Self-pay

## 2024-05-08 NOTE — Telephone Encounter (Signed)
 Letter mailed

## 2024-05-08 NOTE — Telephone Encounter (Signed)
 Reached out to answer patient questions. Patient stated that she was upset that no one has reached out to her to discuss her labs that were collected in the emergency department on 12/22. Patient immediately started yelling saying this is pathetic, no one has reached out to me to discuss lab results. Dr. Jolinda is so busy she doesn't have time to call to talk about my lab results. If she thinks she is following in Dr. Jeraline foot steps she is sadly mistaking, she will never be as good as him. I can not believe this is how she treats her patients I did advise patient that since that lab work was collected during an emergency department encounter those results do not come straight to Dr. Jolinda. I assured patient that I called her and went over lab results and scheduled her follow up lab appointment. Patient had no recollection of me calling to discuss results or scheduling lab appointment. Once I assured patient that all interactions were documented in her chart and that she couldn't have gotten her labs repeated without the appointment I scheduled her, she was able to calm down. Patient stated that if she needed anything else she would call and ask to speak to me directly. I was finally able to address the vitamin confusion, patient will now be taking her B12 and multi vitamin every other day of the week instead of daily. Patient verbalized understanding.

## 2024-05-08 NOTE — Telephone Encounter (Signed)
 It sounds like Lori Soto would be best suited with a different provider.  I'm sad to see that this is her opinion of her care here, but by no means would I want her to remain with a provider she feels to be inadequately meeting her needs.  Dene Caldron, please provide patient a list of alternative providers that are accepting new patients.  I wish her the best with her health and wellbeing.

## 2024-05-09 NOTE — Telephone Encounter (Signed)
 Lab results have been addressed. See 05/08/2024 phone encounter.

## 2024-05-10 ENCOUNTER — Telehealth: Payer: Self-pay | Admitting: Cardiovascular Disease

## 2024-05-10 NOTE — Telephone Encounter (Signed)
 Spoke with the patient who would like clarification on when to hold her medications prior to her procedure. Advised that her last dose of Eliquis  should be on Sunday and her lat dose of metoprolol  should be on Saturday.  Patient reports that her son-in-law passed away and his funeral service is tomorrow so she is very stressed. She is concerned about stopping metoprolol . Reassured patient that it is only a couple of days. Advised if she has elevated heart rates and is concerned it will be okay if she takes a metoprolol , but try to avoid if she can. Patient verbalized understanding.

## 2024-05-10 NOTE — Telephone Encounter (Signed)
 Pt c/o medication issue:  1. Name of Medication:   All  2. How are you currently taking this medication (dosage and times per day)?   3. Are you having a reaction (difficulty breathing--STAT)?   4. What is your medication issue?    Patient wants a call back to discuss medications and how she should be taking them.

## 2024-05-12 ENCOUNTER — Other Ambulatory Visit: Payer: Self-pay | Admitting: Family Medicine

## 2024-05-12 DIAGNOSIS — I471 Supraventricular tachycardia, unspecified: Secondary | ICD-10-CM

## 2024-05-12 DIAGNOSIS — I10 Essential (primary) hypertension: Secondary | ICD-10-CM

## 2024-05-13 NOTE — Pre-Procedure Instructions (Signed)
 Attempted to call patient regarding procedure instructions.  Left voiceamil on the following items: Arrival time 0930 Nothing to eat or drink after midnight No meds AM of procedure Responsible person to drive you home and stay with you for 24 hrs  Have you missed any doses of anti-coagulant Eliquis - last dose 1/11,  Metoprolol - last dose 1/7

## 2024-05-14 ENCOUNTER — Ambulatory Visit (HOSPITAL_COMMUNITY): Admitting: Anesthesiology

## 2024-05-14 ENCOUNTER — Encounter (HOSPITAL_COMMUNITY)
Admission: RE | Disposition: A | Discharge status: Home / Self Care | Payer: Self-pay | Source: Home / Self Care | Attending: Cardiovascular Disease

## 2024-05-14 ENCOUNTER — Ambulatory Visit (HOSPITAL_COMMUNITY)
Admission: RE | Admit: 2024-05-14 | Discharge: 2024-05-14 | Disposition: A | Discharge status: Home / Self Care | Attending: Cardiovascular Disease | Admitting: Cardiovascular Disease

## 2024-05-14 DIAGNOSIS — D649 Anemia, unspecified: Secondary | ICD-10-CM

## 2024-05-14 DIAGNOSIS — Z86711 Personal history of pulmonary embolism: Secondary | ICD-10-CM | POA: Diagnosis not present

## 2024-05-14 DIAGNOSIS — K219 Gastro-esophageal reflux disease without esophagitis: Secondary | ICD-10-CM | POA: Diagnosis not present

## 2024-05-14 DIAGNOSIS — Z7901 Long term (current) use of anticoagulants: Secondary | ICD-10-CM | POA: Insufficient documentation

## 2024-05-14 DIAGNOSIS — I10 Essential (primary) hypertension: Secondary | ICD-10-CM

## 2024-05-14 DIAGNOSIS — I4719 Other supraventricular tachycardia: Secondary | ICD-10-CM | POA: Diagnosis present

## 2024-05-14 DIAGNOSIS — I471 Supraventricular tachycardia, unspecified: Secondary | ICD-10-CM

## 2024-05-14 HISTORY — PX: SVT ABLATION: EP1225

## 2024-05-14 MED ORDER — MIDAZOLAM HCL 2 MG/2ML IJ SOLN
INTRAMUSCULAR | Status: AC
  Filled 2024-05-14: qty 2

## 2024-05-14 MED ORDER — SODIUM CHLORIDE 0.9 % IV SOLN: INTRAVENOUS | Status: DC

## 2024-05-14 MED ORDER — OXYCODONE HCL 5 MG/5ML PO SOLN: 5.0000 mg | Freq: Once | ORAL | Status: DC | PRN

## 2024-05-14 MED ORDER — ONDANSETRON HCL 4 MG/2ML IJ SOLN: 4.0000 mg | Freq: Four times a day (QID) | INTRAMUSCULAR | Status: DC | PRN

## 2024-05-14 MED ORDER — BUPIVACAINE HCL (PF) 0.25 % IJ SOLN
INTRAMUSCULAR | Status: AC
  Filled 2024-05-14: qty 30

## 2024-05-14 MED ORDER — SODIUM CHLORIDE 0.9 % IV SOLN: 250.0000 mL | INTRAVENOUS | Status: DC | PRN

## 2024-05-14 MED ORDER — HEPARIN (PORCINE) IN NACL 1000-0.9 UT/500ML-% IV SOLN
INTRAVENOUS | Status: DC | PRN
  Administered 2024-05-14: 500 mL

## 2024-05-14 MED ORDER — HEPARIN SODIUM (PORCINE) 1000 UNIT/ML IJ SOLN
INTRAMUSCULAR | Status: DC | PRN
  Administered 2024-05-14: 1000 [IU] via INTRAVENOUS

## 2024-05-14 MED ORDER — FENTANYL CITRATE (PF) 100 MCG/2ML IJ SOLN
INTRAMUSCULAR | Status: DC | PRN
  Administered 2024-05-14: 50 ug via INTRAVENOUS
  Administered 2024-05-14 (×2): 25 ug via INTRAVENOUS

## 2024-05-14 MED ORDER — ONDANSETRON HCL 4 MG/2ML IJ SOLN: 4.0000 mg | Freq: Once | INTRAMUSCULAR | Status: DC | PRN

## 2024-05-14 MED ORDER — BUPIVACAINE HCL (PF) 0.25 % IJ SOLN
INTRAMUSCULAR | Status: DC | PRN
  Administered 2024-05-14: 20 mL

## 2024-05-14 MED ORDER — FENTANYL CITRATE (PF) 100 MCG/2ML IJ SOLN
INTRAMUSCULAR | Status: AC
  Filled 2024-05-14: qty 2

## 2024-05-14 MED ORDER — SODIUM CHLORIDE 0.9% FLUSH: 3.0000 mL | INTRAVENOUS | Status: DC | PRN

## 2024-05-14 MED ORDER — ACETAMINOPHEN 325 MG PO TABS: 650.0000 mg | ORAL_TABLET | ORAL | Status: DC | PRN

## 2024-05-14 MED ORDER — FENTANYL CITRATE (PF) 100 MCG/2ML IJ SOLN: 25.0000 ug | INTRAMUSCULAR | Status: DC | PRN

## 2024-05-14 MED ORDER — HEPARIN SODIUM (PORCINE) 1000 UNIT/ML IJ SOLN
INTRAMUSCULAR | Status: AC
  Filled 2024-05-14: qty 10

## 2024-05-14 MED ORDER — OXYCODONE HCL 5 MG PO TABS: 5.0000 mg | ORAL_TABLET | Freq: Once | ORAL | Status: DC | PRN

## 2024-05-14 MED ORDER — MIDAZOLAM HCL (PF) 2 MG/2ML IJ SOLN
INTRAMUSCULAR | Status: DC | PRN
  Administered 2024-05-14 (×2): 1 mg via INTRAVENOUS

## 2024-05-14 MED ORDER — SODIUM CHLORIDE 0.9% FLUSH: 3.0000 mL | Freq: Two times a day (BID) | INTRAVENOUS | Status: DC

## 2024-05-14 NOTE — Transfer of Care (Signed)
 Immediate Anesthesia Transfer of Care Note  Patient: Lori Soto  Procedure(s) Performed: SVT ABLATION  Patient Location: Cath Lab  Anesthesia Type:MAC  Level of Consciousness: awake, alert , and oriented  Airway & Oxygen Therapy: Patient Spontanous Breathing  Post-op Assessment: Report given to RN and Post -op Vital signs reviewed and stable  Post vital signs: Reviewed and stable  Last Vitals:  Vitals Value Taken Time  BP    Temp    Pulse 66 05/14/24 13:13  Resp 9 05/14/24 13:13  SpO2 93 % 05/14/24 13:13  Vitals shown include unfiled device data.  Last Pain:  Vitals:   05/14/24 1000  TempSrc: Oral  PainSc:          Complications: No notable events documented.

## 2024-05-14 NOTE — Progress Notes (Signed)
 Patient walked to the bathroom without difficulties, Right groin level 0, clean, dry, and intact

## 2024-05-14 NOTE — Anesthesia Preprocedure Evaluation (Addendum)
"                                    Anesthesia Evaluation  Patient identified by MRN, date of birth, ID band Patient awake    Reviewed: Allergy & Precautions, NPO status , Patient's Chart, lab work & pertinent test results  History of Anesthesia Complications Negative for: history of anesthetic complications  Airway Mallampati: II  TM Distance: >3 FB Neck ROM: Full    Dental  (+) Dental Advisory Given, Partial Upper, Partial Lower   Pulmonary neg pulmonary ROS   Pulmonary exam normal        Cardiovascular hypertension, Pt. on home beta blockers and Pt. on medications Normal cardiovascular exam+ dysrhythmias Supra Ventricular Tachycardia    '25 TTE - EF 60 to 65%. Right ventricular systolic function is low normal. The right  ventricular size is moderately enlarged. There is mildly elevated pulmonary artery systolic pressure. A small pericardial effusion is present. The pericardial effusion is  anterior to the right ventricle. There is no evidence of cardiac tamponade. Mild TR    Neuro/Psych negative neurological ROS  negative psych ROS   GI/Hepatic Neg liver ROS, hiatal hernia, PUD,GERD  Medicated and Controlled,, UC    Endo/Other  negative endocrine ROS    Renal/GU negative Renal ROS     Musculoskeletal  (+) Arthritis ,    Abdominal   Peds  Hematology  On eliquis     Anesthesia Other Findings   Reproductive/Obstetrics                              Anesthesia Physical Anesthesia Plan  ASA: 3  Anesthesia Plan: MAC   Post-op Pain Management: Tylenol  PO (pre-op)* and Minimal or no pain anticipated   Induction:   PONV Risk Score and Plan: 2 and Propofol  infusion and Treatment may vary due to age or medical condition  Airway Management Planned: Natural Airway and Simple Face Mask  Additional Equipment: None  Intra-op Plan:   Post-operative Plan:   Informed Consent: I have reviewed the patients History and  Physical, chart, labs and discussed the procedure including the risks, benefits and alternatives for the proposed anesthesia with the patient or authorized representative who has indicated his/her understanding and acceptance.       Plan Discussed with: CRNA and Anesthesiologist  Anesthesia Plan Comments:          Anesthesia Quick Evaluation  "

## 2024-05-14 NOTE — Anesthesia Postprocedure Evaluation (Signed)
"   Anesthesia Post Note  Patient: Lori Soto  Procedure(s) Performed: SVT ABLATION     Patient location during evaluation: PACU Anesthesia Type: MAC Level of consciousness: awake and alert Pain management: pain level controlled Vital Signs Assessment: post-procedure vital signs reviewed and stable Respiratory status: spontaneous breathing, nonlabored ventilation and respiratory function stable Cardiovascular status: blood pressure returned to baseline and stable Postop Assessment: no apparent nausea or vomiting Anesthetic complications: no   No notable events documented.  Last Vitals:  Vitals:   05/14/24 1116 05/14/24 1318  BP:  (!) 146/68  Pulse:  69  Resp:  15  Temp:    SpO2: 100% 97%    Last Pain:  Vitals:   05/14/24 1318  TempSrc:   PainSc: 0-No pain   Pain Goal:                   Butler Levander Pinal      "

## 2024-05-14 NOTE — Discharge Instructions (Signed)

## 2024-05-14 NOTE — H&P (Signed)
" °  Electrophysiology Office Note:    Date:  05/14/2024   ID:  Lori Soto, DOB 01-Jun-1935, MRN [id number]  PCP:  Jolinda Norene HERO, DO   [facility] Providers Cardiologist:  Lynwood Schilling, MD Electrophysiologist:  Eulas FORBES Furbish, MD     Referring MD: No ref. provider found   History of Present Illness:    Lori Soto is a 89 y.o. 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      History of Present Illness She has had recurrence of SVT with multiple ER visits and prior administration of adenosine .  The most recent episode was March 03, 2024 (5 days ago).  Heart rate during the episode was about 150 bpm.  EKG is available in epic.  EKG shows a narrow complex tachycardia with a short RP interval.         Today, she reports that she feels well. She presents for EP study and possible ablation of SVT.  EKGs/Labs/Other Studies Reviewed Today:     Echocardiogram:  TTE 10/20/2023 LVEF 60 to 65%.  Small pericardial effusion present.  Mild tricuspid regurgitation.     EKG:         Physical Exam:    VS:  BP (!) 160/88   Pulse 81   Temp 98.1 F (36.7 C) (Oral)   Resp 18   Ht 5' (1.524 m)   Wt 54.9 kg   SpO2 96%   BMI 23.63 kg/m     Wt Readings from Last 3 Encounters:  05/14/24 54.9 kg  04/22/24 55.1 kg  03/18/24 55.1 kg     GEN: Well nourished, well developed in no acute distress CARDIAC: RRR, no murmurs, rubs, gallops RESPIRATORY:  Normal work of breathing     ASSESSMENT & PLAN:     SVT Short RP tachycardia documented on EKG November 2.  Most consistent with AVNRT Plan for EP study and likely ablation Hold Eliquis  the day prior to the procedure and metoprolol  5 days prior to the procedure  We discussed the indication, rationale, logistics, anticipated benefits, and potential risks of the ablation procedure including but not limited to -- bleed at the groin  access site, chest pain, damage to nearby organs such as the diaphragm, lungs, or esophagus, need for a drainage tube, pacemaker, or prolonged hospitalization. I explained that the risk for stroke, heart attack, need for open chest surgery, or even death is very low but not zero. she  expressed understanding and wishes to proceed.   History of PE Continue Eliquis  2.5 mg twice daily     Signed, Eulas FORBES Furbish, MD  05/14/2024 10:56 AM    [facility]  "

## 2024-05-15 ENCOUNTER — Telehealth (HOSPITAL_COMMUNITY): Payer: Self-pay

## 2024-05-15 NOTE — Telephone Encounter (Signed)
 Spoke with patient to complete post procedure follow up call.  Patient reports no complications with groin sites.   Instructions reviewed with patient:  Remove large bandage at puncture site after 24 hours. It is normal to have bruising, tenderness, mild swelling, and a pea or marble sized lump/knot at the groin site which can take up to three months to resolve.  Get help right away if you notice sudden swelling at the puncture site.  Check your puncture site every day for signs of infection: fever, redness, swelling, pus drainage, warmth, foul odor or excessive pain. If this occurs, please call [phone], to speak with the RN Navigator. Get help right away if your puncture site is bleeding and the bleeding does not stop after applying firm pressure to the area.  You may continue to have skipped beats during the first several months after your procedure.  It is very important not to miss any doses of your blood thinner Eliquis .    You will follow up with  Dr. Nancey 1 month after your procedure.  Activity restrictions reviewed.  Patient verbalized understanding to all instructions provided.

## 2024-05-18 ENCOUNTER — Encounter (HOSPITAL_COMMUNITY): Payer: Self-pay | Admitting: Cardiovascular Disease

## 2024-05-24 ENCOUNTER — Ambulatory Visit: Admitting: Cardiovascular Disease

## 2024-06-05 NOTE — Telephone Encounter (Signed)
 Dene Caldron, can you confirm if Dr Dettinger was recommended to patient? I dont have access to the letter you sent.    Copied from CRM [id number]. Topic: Appointments - Transfer of Care >> Jun 05, 2024  1:40 PM Alfonso ORN wrote: Pt is requesting to transfer FROM: Gottschalk,Ashly Pt is requesting to transfer TO: [facility] Reason for requested transfer: pt have letter from Jolinda Potter about getting another provider recommended Dettinger,Joshua A , since pt not satisfiy with service  It is the responsibility of the team the patient would like to transfer to (Dr. Matthews Butler sherlynn Maryanne Fonda  ) to reach out to the patient if for any reason this transfer is not acceptable.   ----------------------------------------------------------------------- From previous Reason for Contact - Scheduling Inquiry for Clinic: Reason for CRM:

## 2024-06-06 NOTE — Telephone Encounter (Signed)
I am not taking new patients at this time

## 2024-06-06 NOTE — Telephone Encounter (Signed)
 Confirmed with Dene Caldron that the letter patient received does not recommend a specific provider for her to re-establish with.   Patient is requesting to re-establish with Dr Dettinger or Dr Zollie, since being dissatisfied with Dr Jolinda.   Please advise if you are okay with the patient re-establishing with you, or if she needs to re-establish with a provider that's taking more patients.

## 2024-06-07 ENCOUNTER — Ambulatory Visit: Admitting: Nurse Practitioner

## 2024-06-07 ENCOUNTER — Encounter: Payer: Self-pay | Admitting: Nurse Practitioner

## 2024-06-07 VITALS — BP 128/76 | HR 71 | Ht 60.0 in | Wt 122.4 lb

## 2024-06-07 DIAGNOSIS — R5383 Other fatigue: Secondary | ICD-10-CM

## 2024-06-07 NOTE — Patient Instructions (Addendum)
 Medication Instructions:  Your physician recommends that you continue on your current medications as directed. Please refer to the Current Medication list given to you today.   Labwork: CBC, CMET TSH and Magnesium to be completed at [facility] in 2-3 weeks  Testing/Procedures: None  Follow-Up: Your physician recommends that you schedule a follow-up appointment in: 3 month telephone visit   Any Other Special Instructions Will Be Listed Below (If Applicable). Thank you for choosing [facility]!     If you need a refill on your cardiac medications before your next appointment, please call your pharmacy.

## 2024-06-07 NOTE — Progress Notes (Unsigned)
 " Cardiology Office Note   Date:  03/07/2024 ID:  Lori Soto, DOB 07/25/1935, MRN [id number] PCP: No primary care provider on file.  [facility] Providers Cardiologist:  Lynwood Schilling, MD Electrophysiologist:  Eulas FORBES Furbish, MD     History of Present Illness Lori Soto is a 89 y.o. female with a PMH of SVT, hx of PE, and HTN, who presents today for ED follow-up.   Past history of hospitalizations for SVT, has required tx with adenosine  in the past. Hx of past PE in January 2025, PE was found to be unprovoked.   Last seen by Dr. Schilling on 08/09/2023. Was overall doing well at the time from a cardiac standpoint, refused SVT ablation. Most recent ED visit on 03/03/2024 for SVT and presented with palpitations, took an extra beta blocker that did not help. EKG showed SVT, HR 150 bpm. Denied any CP or SHOB. Received adenosine , converted to NSR.   Today she is here for ED follow-up. She says this past episode of SVT, it felt like bugs crawling on her skin, no overt palpitations, HR was in 150's and when she noticed this, she took an extra beta blocker, but did not help relieve her SVT, and then went to the ED. Wants to know why she keeps having these episodes. Denies any recent chest pain, shortness of breath, palpitations, syncope, presyncope, dizziness, orthopnea, PND, swelling or significant weight changes, acute bleeding, or claudication.  ROS: Negative. See HPI.   Studies Reviewed  EKG: EKG is not ordered today.   Echo 05/2023:   1. Left ventricular ejection fraction, by estimation, is 60 to 65%. The  left ventricle has normal function. The left ventricle has no regional  wall motion abnormalities. Left ventricular diastolic parameters are  indeterminate.   2. Right ventricular systolic function is low normal. The right  ventricular size is moderately enlarged. There is mildly elevated  pulmonary artery systolic pressure.   3. A small pericardial effusion is  present. The pericardial effusion is  anterior to the right ventricle. There is no evidence of cardiac  tamponade.   4. The mitral valve is normal in structure. No evidence of mitral valve  regurgitation. No evidence of mitral stenosis.   5. The tricuspid valve is abnormal.   6. The aortic valve is tricuspid. Aortic valve regurgitation is not  visualized. No aortic stenosis is present.   7. The inferior vena cava is normal in size with greater than 50%  respiratory variability, suggesting right atrial pressure of 3 mmHg.  Risk Assessment/Calculations          Physical Exam VS:  BP 128/76 (BP Location: Left Arm)   Pulse 71   Ht 5' (1.524 m)   Wt 122 lb 6.4 oz (55.5 kg)   SpO2 100%   BMI 23.90 kg/m        Wt Readings from Last 3 Encounters:  06/07/24 122 lb 6.4 oz (55.5 kg)  05/14/24 121 lb (54.9 kg)  04/22/24 121 lb 7.6 oz (55.1 kg)    GEN: Well nourished, well developed in no acute distress NECK: No JVD; No carotid bruits CARDIAC: S1/S2, slow rate and regular rhythm, no murmurs, rubs, gallops RESPIRATORY:  Clear to auscultation without rales, wheezing or rhonchi  ABDOMEN: Soft, non-tender, non-distended EXTREMITIES:  No edema; No deformity   ASSESSMENT AND PLAN  SVT Continues to have episodes of SVT, has required hospitalizations and receiving adenosine . Had in depth discussion regarding management and treatment of SVT.  Agree with Dr. Lavona that she would be a good candidate for ablation, pt is agreeable to EP referral - will arrange. No current room to titrate her beta blocker as her HR today is 52 bpm. Care and ED precautions discussed.   Hx of PE Continue current dose of Eliquis , she is on appropriate dosage and denies any bleeding issues.   HTN   BP is elevated today, her home readings were reviewed today that she brought in from home which shows she has some component of [facility]. Discussed to monitor BP at home at least 2 hours after medications and sitting for 5-10  minutes. Discussed when to notify our office. No medication changes at this time. Heart healthy diet and regular cardiovascular exercise encouraged.   4. Elevated serum creatinine Most recent labs showed sCr of 1.09 with eGFR of 49, most likely d/t dehydration. Will recheck BMET and discussed to stay well hydrated. Continue to follow with PCP.    Dispo: Follow-up with MD/APP in 3 months or sooner if anything changes.   Signed, Almarie Crate, NP   "

## 2024-07-05 ENCOUNTER — Ambulatory Visit: Admitting: Cardiovascular Disease

## 2024-08-09 ENCOUNTER — Encounter: Admitting: Family Medicine

## 2024-09-02 ENCOUNTER — Ambulatory Visit: Admitting: Nurse Practitioner
